# Patient Record
Sex: Female | Born: 1935
Health system: Southern US, Community
[De-identification: ages and names within clinical notes are randomized; demographics above are authoritative.]

## PROBLEM LIST (undated history)

## (undated) DIAGNOSIS — I71019 Dissection of thoracic aorta, unspecified: Secondary | ICD-10-CM

## (undated) DIAGNOSIS — I7101 Dissection of thoracic aorta: Secondary | ICD-10-CM

## (undated) DIAGNOSIS — N261 Atrophy of kidney (terminal): Secondary | ICD-10-CM

## (undated) DIAGNOSIS — D509 Iron deficiency anemia, unspecified: Secondary | ICD-10-CM

## (undated) DIAGNOSIS — I2510 Atherosclerotic heart disease of native coronary artery without angina pectoris: Secondary | ICD-10-CM

## (undated) DIAGNOSIS — K279 Peptic ulcer, site unspecified, unspecified as acute or chronic, without hemorrhage or perforation: Secondary | ICD-10-CM

## (undated) DIAGNOSIS — D649 Anemia, unspecified: Secondary | ICD-10-CM

## (undated) DIAGNOSIS — K922 Gastrointestinal hemorrhage, unspecified: Secondary | ICD-10-CM

## (undated) DIAGNOSIS — E039 Hypothyroidism, unspecified: Secondary | ICD-10-CM

## (undated) DIAGNOSIS — N183 Chronic kidney disease, stage 3 unspecified: Secondary | ICD-10-CM

## (undated) DIAGNOSIS — Z8679 Personal history of other diseases of the circulatory system: Secondary | ICD-10-CM

## (undated) DIAGNOSIS — N259 Disorder resulting from impaired renal tubular function, unspecified: Secondary | ICD-10-CM

## (undated) DIAGNOSIS — G459 Transient cerebral ischemic attack, unspecified: Secondary | ICD-10-CM

## (undated) DIAGNOSIS — Z87891 Personal history of nicotine dependence: Secondary | ICD-10-CM

## (undated) DIAGNOSIS — E119 Type 2 diabetes mellitus without complications: Secondary | ICD-10-CM

## (undated) DIAGNOSIS — I4510 Unspecified right bundle-branch block: Secondary | ICD-10-CM

## (undated) DIAGNOSIS — E785 Hyperlipidemia, unspecified: Secondary | ICD-10-CM

## (undated) DIAGNOSIS — I10 Essential (primary) hypertension: Secondary | ICD-10-CM

## (undated) DIAGNOSIS — R209 Unspecified disturbances of skin sensation: Secondary | ICD-10-CM

## (undated) DIAGNOSIS — M109 Gout, unspecified: Secondary | ICD-10-CM

## (undated) HISTORY — DX: Atrophy of kidney (terminal): N26.1

## (undated) HISTORY — PX: APPENDECTOMY (OPEN): SHX54

## (undated) HISTORY — DX: Dissection of thoracic aorta, unspecified: I71.019

## (undated) HISTORY — PX: OTHER SURGICAL HISTORY: SHX169

## (undated) HISTORY — DX: Dissection of thoracic aorta: I71.01

## (undated) HISTORY — PX: MOUTH SURGERY: SHX715

## (undated) HISTORY — PX: GLAUCOMA SURGERY: SHX656

## (undated) HISTORY — PX: HYSTERECTOMY: SHX81

## (undated) HISTORY — DX: Iron deficiency anemia, unspecified: D50.9

## (undated) HISTORY — DX: Atherosclerotic heart disease of native coronary artery without angina pectoris: I25.10

## (undated) HISTORY — DX: Gout, unspecified: M10.9

## (undated) HISTORY — DX: Unspecified disturbances of skin sensation: R20.9

## (undated) HISTORY — DX: Anemia, unspecified: D64.9

## (undated) HISTORY — DX: Disorder resulting from impaired renal tubular function, unspecified: N25.9

## (undated) HISTORY — DX: Personal history of other diseases of the circulatory system: Z86.79

## (undated) HISTORY — DX: Peptic ulcer, site unspecified, unspecified as acute or chronic, without hemorrhage or perforation: K27.9

---
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
## 1998-10-11 ENCOUNTER — Ambulatory Visit (HOSPITAL_COMMUNITY): Admission: RE | Admit: 1998-10-11 | Discharge: 1998-10-11 | Payer: Self-pay | Admitting: Family Medicine

## 1999-04-26 ENCOUNTER — Emergency Department (HOSPITAL_COMMUNITY): Admission: EM | Admit: 1999-04-26 | Discharge: 1999-04-26 | Payer: Self-pay | Admitting: Emergency Medicine

## 2000-01-13 ENCOUNTER — Ambulatory Visit (HOSPITAL_COMMUNITY): Admission: RE | Admit: 2000-01-13 | Discharge: 2000-01-13 | Payer: Self-pay | Admitting: Family Medicine

## 2001-01-14 ENCOUNTER — Ambulatory Visit (HOSPITAL_COMMUNITY): Admission: RE | Admit: 2001-01-14 | Discharge: 2001-01-14 | Payer: Self-pay | Admitting: Family Medicine

## 2001-07-01 DIAGNOSIS — G459 Transient cerebral ischemic attack, unspecified: Secondary | ICD-10-CM

## 2001-07-01 HISTORY — DX: Transient cerebral ischemic attack, unspecified: G45.9

## 2002-06-15 ENCOUNTER — Encounter: Payer: Self-pay | Admitting: Family Medicine

## 2002-06-15 ENCOUNTER — Ambulatory Visit (HOSPITAL_COMMUNITY): Admission: RE | Admit: 2002-06-15 | Discharge: 2002-06-15 | Payer: Self-pay | Admitting: Family Medicine

## 2003-07-12 ENCOUNTER — Ambulatory Visit (HOSPITAL_COMMUNITY): Admission: RE | Admit: 2003-07-12 | Discharge: 2003-07-12 | Payer: Self-pay | Admitting: Family Medicine

## 2003-07-12 ENCOUNTER — Encounter: Payer: Self-pay | Admitting: Family Medicine

## 2003-07-30 ENCOUNTER — Emergency Department (HOSPITAL_COMMUNITY): Admission: EM | Admit: 2003-07-30 | Discharge: 2003-07-30 | Payer: Self-pay | Admitting: Emergency Medicine

## 2003-07-30 ENCOUNTER — Encounter: Payer: Self-pay | Admitting: Emergency Medicine

## 2005-02-28 ENCOUNTER — Ambulatory Visit (HOSPITAL_COMMUNITY): Admission: RE | Admit: 2005-02-28 | Discharge: 2005-02-28 | Payer: Self-pay | Admitting: Family Medicine

## 2005-03-27 ENCOUNTER — Ambulatory Visit: Payer: Self-pay | Admitting: Internal Medicine

## 2005-10-31 ENCOUNTER — Ambulatory Visit: Payer: Self-pay | Admitting: Internal Medicine

## 2006-03-02 ENCOUNTER — Ambulatory Visit (HOSPITAL_COMMUNITY): Admission: RE | Admit: 2006-03-02 | Discharge: 2006-03-02 | Payer: Self-pay | Admitting: Internal Medicine

## 2006-03-03 ENCOUNTER — Ambulatory Visit: Payer: Self-pay | Admitting: Internal Medicine

## 2006-03-18 ENCOUNTER — Encounter: Admission: RE | Admit: 2006-03-18 | Discharge: 2006-03-18 | Payer: Self-pay | Admitting: Internal Medicine

## 2006-06-22 ENCOUNTER — Ambulatory Visit: Payer: Self-pay | Admitting: Internal Medicine

## 2006-10-14 ENCOUNTER — Ambulatory Visit: Payer: Self-pay | Admitting: Internal Medicine

## 2007-03-04 ENCOUNTER — Ambulatory Visit (HOSPITAL_COMMUNITY): Admission: RE | Admit: 2007-03-04 | Discharge: 2007-03-04 | Payer: Self-pay | Admitting: Internal Medicine

## 2007-03-04 ENCOUNTER — Encounter: Payer: Self-pay | Admitting: Internal Medicine

## 2007-03-15 ENCOUNTER — Ambulatory Visit: Payer: Self-pay | Admitting: Internal Medicine

## 2007-03-15 LAB — CONVERTED CEMR LAB
AST: 25 units/L (ref 0–37)
Cholesterol: 174 mg/dL (ref 0–200)

## 2007-06-25 ENCOUNTER — Ambulatory Visit: Payer: Self-pay | Admitting: Internal Medicine

## 2007-06-25 ENCOUNTER — Encounter: Payer: Self-pay | Admitting: Internal Medicine

## 2007-06-25 DIAGNOSIS — N259 Disorder resulting from impaired renal tubular function, unspecified: Secondary | ICD-10-CM | POA: Insufficient documentation

## 2007-06-25 DIAGNOSIS — Z8679 Personal history of other diseases of the circulatory system: Secondary | ICD-10-CM | POA: Insufficient documentation

## 2007-06-25 DIAGNOSIS — D509 Iron deficiency anemia, unspecified: Secondary | ICD-10-CM | POA: Insufficient documentation

## 2007-06-25 DIAGNOSIS — K279 Peptic ulcer, site unspecified, unspecified as acute or chronic, without hemorrhage or perforation: Secondary | ICD-10-CM | POA: Insufficient documentation

## 2007-06-25 DIAGNOSIS — D649 Anemia, unspecified: Secondary | ICD-10-CM | POA: Insufficient documentation

## 2007-06-25 DIAGNOSIS — I10 Essential (primary) hypertension: Secondary | ICD-10-CM | POA: Insufficient documentation

## 2007-06-25 DIAGNOSIS — E7800 Pure hypercholesterolemia, unspecified: Secondary | ICD-10-CM | POA: Insufficient documentation

## 2007-06-25 HISTORY — DX: Personal history of other diseases of the circulatory system: Z86.79

## 2007-06-25 HISTORY — DX: Essential (primary) hypertension: I10

## 2007-06-25 HISTORY — DX: Iron deficiency anemia, unspecified: D50.9

## 2007-06-25 HISTORY — DX: Anemia, unspecified: D64.9

## 2007-06-25 HISTORY — DX: Disorder resulting from impaired renal tubular function, unspecified: N25.9

## 2007-06-25 HISTORY — DX: Peptic ulcer, site unspecified, unspecified as acute or chronic, without hemorrhage or perforation: K27.9

## 2007-06-25 HISTORY — DX: Pure hypercholesterolemia, unspecified: E78.00

## 2007-08-13 ENCOUNTER — Encounter: Payer: Self-pay | Admitting: Internal Medicine

## 2007-09-27 ENCOUNTER — Ambulatory Visit: Payer: Self-pay | Admitting: Internal Medicine

## 2007-09-27 LAB — CONVERTED CEMR LAB: Hgb A1c MFr Bld: 6.7 % — ABNORMAL HIGH (ref 4.6–6.0)

## 2007-10-06 ENCOUNTER — Telehealth: Payer: Self-pay | Admitting: Internal Medicine

## 2007-10-21 ENCOUNTER — Encounter: Payer: Self-pay | Admitting: Internal Medicine

## 2008-01-21 ENCOUNTER — Ambulatory Visit: Payer: Self-pay | Admitting: Internal Medicine

## 2008-01-21 LAB — CONVERTED CEMR LAB: Hgb A1c MFr Bld: 6.5 % — ABNORMAL HIGH (ref 4.6–6.0)

## 2008-02-18 ENCOUNTER — Telehealth: Payer: Self-pay | Admitting: Internal Medicine

## 2008-06-02 ENCOUNTER — Ambulatory Visit (HOSPITAL_COMMUNITY): Admission: RE | Admit: 2008-06-02 | Discharge: 2008-06-02 | Payer: Self-pay | Admitting: Internal Medicine

## 2008-07-03 ENCOUNTER — Ambulatory Visit: Payer: Self-pay | Admitting: Internal Medicine

## 2008-09-25 ENCOUNTER — Ambulatory Visit: Payer: Self-pay | Admitting: Internal Medicine

## 2008-10-23 ENCOUNTER — Ambulatory Visit: Payer: Self-pay | Admitting: Internal Medicine

## 2008-10-23 LAB — CONVERTED CEMR LAB
BUN: 16 mg/dL (ref 6–23)
Blood Glucose, Fingerstick: 120
CO2: 28 meq/L (ref 19–32)
Calcium: 9.4 mg/dL (ref 8.4–10.5)
GFR calc non Af Amer: 36 mL/min
Hgb A1c MFr Bld: 6.8 % — ABNORMAL HIGH (ref 4.6–6.0)
Sodium: 140 meq/L (ref 135–145)

## 2009-03-27 ENCOUNTER — Ambulatory Visit: Payer: Self-pay | Admitting: Internal Medicine

## 2009-03-27 DIAGNOSIS — R209 Unspecified disturbances of skin sensation: Secondary | ICD-10-CM | POA: Insufficient documentation

## 2009-03-27 HISTORY — DX: Unspecified disturbances of skin sensation: R20.9

## 2009-03-27 LAB — CONVERTED CEMR LAB
Blood Glucose, Fingerstick: 115
Hgb A1c MFr Bld: 6.6 % — ABNORMAL HIGH (ref 4.6–6.5)

## 2009-03-28 ENCOUNTER — Encounter: Payer: Self-pay | Admitting: Internal Medicine

## 2009-06-04 ENCOUNTER — Ambulatory Visit (HOSPITAL_COMMUNITY): Admission: RE | Admit: 2009-06-04 | Discharge: 2009-06-04 | Payer: Self-pay | Admitting: Internal Medicine

## 2009-08-15 ENCOUNTER — Encounter: Payer: Self-pay | Admitting: Internal Medicine

## 2009-08-16 ENCOUNTER — Ambulatory Visit: Payer: Self-pay | Admitting: Internal Medicine

## 2009-08-16 LAB — CONVERTED CEMR LAB
AST: 25 units/L (ref 0–37)
Alkaline Phosphatase: 54 units/L (ref 39–117)
BUN: 18 mg/dL (ref 6–23)
Basophils Absolute: 0 10*3/uL (ref 0.0–0.1)
Calcium: 9.7 mg/dL (ref 8.4–10.5)
Creatinine, Ser: 1.3 mg/dL — ABNORMAL HIGH (ref 0.4–1.2)
Creatinine,U: 372.2 mg/dL
Eosinophils Relative: 9 % — ABNORMAL HIGH (ref 0.0–5.0)
Glucose, Bld: 106 mg/dL — ABNORMAL HIGH (ref 70–99)
HDL: 48.7 mg/dL (ref >39.00)
Hgb A1c MFr Bld: 6.5 % (ref 4.6–6.5)
LDL Cholesterol: 83 mg/dL (ref 0–99)
Lymphocytes Relative: 29.7 % (ref 12.0–46.0)
Microalb Creat Ratio: 5.6 mg/g (ref 0.0–30.0)
Monocytes Relative: 7.6 % (ref 3.0–12.0)
Sodium: 142 meq/L (ref 135–145)
TSH: 1.35 microintl units/mL (ref 0.35–5.50)
Total Bilirubin: 1.2 mg/dL (ref 0.3–1.2)
Total CHOL/HDL Ratio: 3
Triglycerides: 99 mg/dL (ref 0.0–149.0)

## 2009-10-01 ENCOUNTER — Ambulatory Visit: Payer: Self-pay | Admitting: Internal Medicine

## 2009-10-01 LAB — CONVERTED CEMR LAB: Blood Glucose, Fingerstick: 111

## 2009-11-16 ENCOUNTER — Ambulatory Visit: Payer: Self-pay | Admitting: Internal Medicine

## 2009-11-16 DIAGNOSIS — M109 Gout, unspecified: Secondary | ICD-10-CM | POA: Insufficient documentation

## 2009-11-16 HISTORY — DX: Gout, unspecified: M10.9

## 2009-11-16 LAB — CONVERTED CEMR LAB
Blood Glucose, Fingerstick: 97
Uric Acid, Serum: 4.3 mg/dL (ref 2.4–7.0)

## 2010-04-01 ENCOUNTER — Ambulatory Visit: Payer: Self-pay | Admitting: Internal Medicine

## 2010-04-01 LAB — CONVERTED CEMR LAB
BUN: 12 mg/dL (ref 6–23)
Blood Glucose, Fingerstick: 95
Creatinine, Ser: 1.4 mg/dL — ABNORMAL HIGH (ref 0.4–1.2)
GFR calc non Af Amer: 47.39 mL/min (ref >60)
Hgb A1c MFr Bld: 6.3 % (ref 4.6–6.5)
Sodium: 142 meq/L (ref 135–145)

## 2010-05-28 ENCOUNTER — Ambulatory Visit (HOSPITAL_COMMUNITY): Admission: RE | Admit: 2010-05-28 | Discharge: 2010-05-28 | Payer: Self-pay | Admitting: Internal Medicine

## 2010-06-05 ENCOUNTER — Encounter: Payer: Self-pay | Admitting: Internal Medicine

## 2010-08-05 ENCOUNTER — Ambulatory Visit: Payer: Self-pay | Admitting: Internal Medicine

## 2010-08-20 ENCOUNTER — Inpatient Hospital Stay (HOSPITAL_COMMUNITY): Admission: EM | Admit: 2010-08-20 | Discharge: 2010-08-21 | Payer: Self-pay | Admitting: Emergency Medicine

## 2010-08-20 ENCOUNTER — Ambulatory Visit: Payer: Self-pay | Admitting: Internal Medicine

## 2010-08-20 ENCOUNTER — Encounter (INDEPENDENT_AMBULATORY_CARE_PROVIDER_SITE_OTHER): Payer: Self-pay | Admitting: Internal Medicine

## 2010-12-31 ENCOUNTER — Ambulatory Visit: Admit: 2010-12-31 | Payer: Self-pay | Admitting: Internal Medicine

## 2011-01-12 LAB — CONVERTED CEMR LAB
ALT: 15 units/L (ref 0–35)
ALT: 19 units/L (ref 0–35)
AST: 22 units/L (ref 0–37)
Albumin: 3.9 g/dL (ref 3.5–5.2)
BUN: 13 mg/dL (ref 6–23)
Basophils Absolute: 0 10*3/uL (ref 0.0–0.1)
Bilirubin, Direct: 0.1 mg/dL (ref 0.0–0.3)
Chloride: 106 meq/L (ref 96–112)
Cholesterol: 153 mg/dL (ref 0–200)
Creatinine, Ser: 1.4 mg/dL — ABNORMAL HIGH (ref 0.4–1.2)
Eosinophils Absolute: 0.4 10*3/uL (ref 0.0–0.7)
Eosinophils Relative: 5.5 % — ABNORMAL HIGH (ref 0.0–5.0)
Eosinophils Relative: 5.7 % — ABNORMAL HIGH (ref 0.0–5.0)
Glucose, Bld: 111 mg/dL — ABNORMAL HIGH (ref 70–99)
Glucose, Bld: 119 mg/dL — ABNORMAL HIGH (ref 70–99)
Hemoglobin: 11.3 g/dL — ABNORMAL LOW (ref 12.0–15.0)
Hgb A1c MFr Bld: 6.8 % — ABNORMAL HIGH (ref 4.6–6.0)
LDL Cholesterol: 82 mg/dL (ref 0–99)
Lymphocytes Relative: 30.8 % (ref 12.0–46.0)
MCHC: 32.1 g/dL (ref 30.0–36.0)
MCHC: 32.1 g/dL (ref 30.0–36.0)
Monocytes Relative: 7.1 % (ref 3.0–11.0)
Neutro Abs: 4.5 10*3/uL (ref 1.4–7.7)
Platelets: 290 10*3/uL (ref 150–400)
Potassium: 3.9 meq/L (ref 3.5–5.1)
RBC: 5.09 M/uL (ref 3.87–5.11)
Sodium: 141 meq/L (ref 135–145)
TSH: 2.2 microintl units/mL (ref 0.35–5.50)
Total Bilirubin: 1 mg/dL (ref 0.3–1.2)
Total Bilirubin: 1.1 mg/dL (ref 0.3–1.2)
Total CHOL/HDL Ratio: 3
Total Protein: 7.7 g/dL (ref 6.0–8.3)
WBC: 7.5 10*3/uL (ref 4.5–10.5)
WBC: 7.9 10*3/uL (ref 4.5–10.5)

## 2011-01-14 NOTE — Assessment & Plan Note (Signed)
Summary: 4 month fup//ccm rsc bmp/njr   Vital Signs:  Patient profile:   76 year old female Weight:      183 pounds Temp:     97.8 degrees F oral BP sitting:   120 / 82  (right arm) Cuff size:   regular  Vitals Entered By: Cay Schillings LPN (August 22, 624THL 8:42 AM) CC: 4 mo rov - doing well    c/o knee swelling Is Patient Diabetic? Yes Did you bring your meter with you today? No CBG Result 124   CC:  4 mo rov - doing well    c/o knee swelling.  History of Present Illness:  76 year old patient who is seen today for follow-up of type 2 diabetes.she has dyslipidemia, well controlled on simvastatin therapy.  Her last hemoglobin A1c6.3.  Chest treated hypertension and a history of gout.  She has mild renal insufficiency.  She is doing quite well.  she just returned from a [place] and has had some knee pain that she feels is related to all the activity.  No new concerns or complaints.  She maintains not glycemic control.  Her blood pressure normal.  Today  Allergies (verified): No Known Drug Allergies  Past History:  Past Medical History: Reviewed history from 11/16/2009 and no changes required. Diabetes mellitus, type II Hyperlipidemia Hypertension Anemia-NOS Peptic ulcer disease Renal insufficiency RBBB and left anterior hemiblock Transient ischemic attack, hx of  history of anemia, secondary to thalassemia trait history of left brain TIA 2005 Gout  Past Surgical History: Reviewed history from 01/21/2008 and no changes required. Appendectomy 53 Hysterectomy 80 Tonsillectomy 44 colonoscopy 2003 or 2004  Review of Systems       The patient complains of difficulty walking.  The patient denies anorexia, fever, weight loss, weight gain, vision loss, decreased hearing, hoarseness, chest pain, syncope, dyspnea on exertion, peripheral edema, prolonged cough, headaches, hemoptysis, abdominal pain, melena, hematochezia, severe indigestion/heartburn, hematuria,  incontinence, genital sores, muscle weakness, suspicious skin lesions, transient blindness, depression, unusual weight change, abnormal bleeding, enlarged lymph nodes, angioedema, and breast masses.    Physical Exam  General:  overweight-appearing.  120/82overweight-appearing.   Head:  Normocephalic and atraumatic without obvious abnormalities. No apparent alopecia or balding. Eyes:  No corneal or conjunctival inflammation noted. EOMI. Perrla. Funduscopic exam benign, without hemorrhages, exudates or papilledema. Vision grossly normal. Mouth:  Oral mucosa and oropharynx without lesions or exudates.  Teeth in good repair. Neck:  No deformities, masses, or tenderness noted. Chest Wall:  No deformities, masses, or tenderness noted. Lungs:  Normal respiratory effort, chest expands symmetrically. Lungs are clear to auscultation, no crackles or wheezes. Heart:  Normal rate and regular rhythm. S1 and S2 normal without gallop, murmur, click, rub or other extra sounds. Abdomen:  Bowel sounds positive,abdomen soft and non-tender without masses, organomegaly or hernias noted. Msk:  No deformity or scoliosis noted of thoracic or lumbar spine.   Pulses:  R and L carotid,radial,femoral,dorsalis pedis and posterior tibial pulses are full and equal bilaterally  Diabetes Management Exam:    Foot Exam (with socks and/or shoes not present):       Sensory-Pinprick/Light touch:          Left medial foot (L-4): normal          Left dorsal foot (L-5): normal          Left lateral foot (S-1): normal          Right medial foot (L-4): normal  Right dorsal foot (L-5): normal          Right lateral foot (S-1): normal       Sensory-Monofilament:          Left foot: normal          Right foot: normal       Inspection:          Left foot: normal          Right foot: normal       Nails:          Left foot: normal          Right foot: normal    Foot Exam by Podiatrist:       Date: 08/05/2010        Results: no diabetic findings       Done by: PCP    Eye Exam:       Eye Exam done elsewhere          Date: 06/05/2010          Results: normal          Done by: groat   Impression & Recommendations:  Problem # 1:  RENAL INSUFFICIENCY (ICD-588.9)  Problem # 2:  HYPERTENSION (ICD-401.9)  Her updated medication list for this problem includes:    Lisinopril 20 Mg Tabs (Lisinopril) ..... One daily  Her updated medication list for this problem includes:    Lisinopril 20 Mg Tabs (Lisinopril) ..... One daily  Problem # 3:  DIABETES MELLITUS, TYPE II (ICD-250.00)  Her updated medication list for this problem includes:    Metformin Hcl 500 Mg Tabs (Metformin hcl) ..... One twice daily    Bayer Aspirin 325 Mg Tabs (Aspirin) .Marland Kitchen... 1 once daily    Lisinopril 20 Mg Tabs (Lisinopril) ..... One daily  Orders: Capillary Blood Glucose/CBG [id number]) Venipuncture [id number]) TLB-A1C / Hgb A1C (Glycohemoglobin) (83036-A1C) Specimen Handling (99000)  Her updated medication list for this problem includes:    Metformin Hcl 500 Mg Tabs (Metformin hcl) ..... One twice daily    Bayer Aspirin 325 Mg Tabs (Aspirin) .Marland Kitchen... 1 once daily    Lisinopril 20 Mg Tabs (Lisinopril) ..... One daily  Problem # 4:  HYPERLIPIDEMIA (ICD-272.4)  Her updated medication list for this problem includes:    Simvastatin 40 Mg Tabs (Simvastatin) .Marland Kitchen... 1 once daily  Her updated medication list for this problem includes:    Simvastatin 40 Mg Tabs (Simvastatin) .Marland Kitchen... 1 once daily  Complete Medication List: 1)  Cimetidine 400 Mg Tabs (Cimetidine) .Marland Kitchen.. 1 two times a day 2)  Klor-con M20 20 Meq Tbcr (Potassium chloride crys cr) .Marland Kitchen.. 1 once daily 3)  Metformin Hcl 500 Mg Tabs (Metformin hcl) .... One twice daily 4)  Simvastatin 40 Mg Tabs (Simvastatin) .Marland Kitchen.. 1 once daily 5)  Bayer Aspirin 325 Mg Tabs (Aspirin) .Marland Kitchen.. 1 once daily 6)  Proair Hfa 108 (90 Base) Mcg/act Aers (Albuterol sulfate) .... Uad 7)  Lisinopril 20 Mg Tabs  (Lisinopril) .... One daily 8)  Allopurinol 300 Mg Tabs (Allopurinol) .Marland Kitchen.. 1 once daily 9)  Colchicine 0.6 Mg Tabs (Colchicine) .... As needed  Patient Instructions: 1)  Please schedule a follow-up appointment in 4 months for CPX  2)  Limit your Sodium (Salt) to less than 2 grams a day(slightly less than 1/2 a teaspoon) to prevent fluid retention, swelling, or worsening of symptoms. 3)  It is important that you exercise regularly at least 20 minutes 5 times a week. If  you develop chest pain, have severe difficulty breathing, or feel very tired , stop exercising immediately and seek medical attention. 4)  You need to lose weight. Consider a lower calorie diet and regular exercise.  5)  Check your blood sugars regularly. If your readings are usually above : or below 70 you should contact our office. 6)  It is important that your Diabetic A1c level is checked every 3 months. 7)  Advised not to eat any food or drink any liquids after 10 PM the night before your procedure.

## 2011-01-14 NOTE — Letter (Signed)
Summary: Diabetic Eye Exam/[facility]  Diabetic Eye Exam/[facility]   Imported By: Laural Benes 06/14/2010 13:20:35  _____________________________________________________________________  External Attachment:    Type:   Image     Comment:   External Document

## 2011-01-14 NOTE — Assessment & Plan Note (Signed)
Summary: 4 mo rov/mm---PT [place] (BMP) // RS   Vital Signs:  Patient profile:   76 year old female Weight:      185 pounds Temp:     98.2 degrees F oral BP sitting:   118 / 78  (right arm) Cuff size:   regular  Vitals Entered By: Cay Schillings LPN (April 18, 624THL [id number] AM) CC: 4 mos rov - doing well Is Patient Diabetic? Yes Did you bring your meter with you today? No CBG Result 95   CC:  4 mos rov - doing well.  History of Present Illness: 76 year old patient who is seen today for follow-up of her type 2 diabetes.  She has a history of hypertension and dyslipidemia.  She is doing quite well.  She also is a history of mild anemia.  Her last and will A1c was stable.  She is scheduled for an eye exam tomorrow.  Random blood sugar today 95.  She has a history of dyslipidemia, controlled on simvastatin, which she continues to tolerate well.  she denies any cardiopulmonary complaints.  Preventive Screening-Counseling & Management  Alcohol-Tobacco     Smoking Status: quit  Allergies (verified): No Known Drug Allergies  Past History:  Past Medical History: Reviewed history from 11/16/2009 and no changes required. Diabetes mellitus, type II Hyperlipidemia Hypertension Anemia-NOS Peptic ulcer disease Renal insufficiency RBBB and left anterior hemiblock Transient ischemic attack, hx of  history of anemia, secondary to thalassemia trait history of left brain TIA 2005 Gout  Past Surgical History: Reviewed history from 01/21/2008 and no changes required. Appendectomy 53 Hysterectomy 78 Tonsillectomy 44 colonoscopy 2003 or 2004  Review of Systems  The patient denies anorexia, fever, weight loss, weight gain, vision loss, decreased hearing, hoarseness, chest pain, syncope, dyspnea on exertion, peripheral edema, prolonged cough, headaches, hemoptysis, abdominal pain, melena, hematochezia, severe indigestion/heartburn, hematuria, incontinence, genital sores, muscle weakness,  suspicious skin lesions, transient blindness, difficulty walking, depression, unusual weight change, abnormal bleeding, enlarged lymph nodes, angioedema, and breast masses.    Physical Exam  General:  overweight-appearing.  118/78overweight-appearing.   Head:  Normocephalic and atraumatic without obvious abnormalities. No apparent alopecia or balding. Eyes:  No corneal or conjunctival inflammation noted. EOMI. Perrla. Funduscopic exam benign, without hemorrhages, exudates or papilledema. Vision grossly normal. Mouth:  Oral mucosa and oropharynx without lesions or exudates.  Teeth in good repair. Neck:  No deformities, masses, or tenderness noted. Lungs:  Normal respiratory effort, chest expands symmetrically. Lungs are clear to auscultation, no crackles or wheezes. Heart:  Normal rate and regular rhythm. S1 and S2 normal without gallop, murmur, click, rub or other extra sounds. Abdomen:  Bowel sounds positive,abdomen soft and non-tender without masses, organomegaly or hernias noted. Msk:  No deformity or scoliosis noted of thoracic or lumbar spine.   Pulses:  R and L carotid,radial,femoral,dorsalis pedis and posterior tibial pulses are full and equal bilaterally Extremities:  No clubbing, cyanosis, edema, or deformity noted with normal full range of motion of all joints.     Impression & Recommendations:  Problem # 1:  RENAL INSUFFICIENCY (ICD-588.9)  Orders: Venipuncture [id number]) TLB-BMP (Basic Metabolic Panel-BMET) (99991111)  Problem # 2:  HYPERTENSION (ICD-401.9)  Her updated medication list for this problem includes:    Lisinopril 20 Mg Tabs (Lisinopril) ..... One daily  Her updated medication list for this problem includes:    Lisinopril 20 Mg Tabs (Lisinopril) ..... One daily  Problem # 3:  DIABETES MELLITUS, TYPE II (ICD-250.00)  Her updated medication list for  this problem includes:    Metformin Hcl 500 Mg Tabs (Metformin hcl) ..... One twice daily    Bayer Aspirin  325 Mg Tabs (Aspirin) .Marland Kitchen... 1 once daily    Lisinopril 20 Mg Tabs (Lisinopril) ..... One daily  Orders: Capillary Blood Glucose/CBG (82948) TLB-A1C / Hgb A1C (Glycohemoglobin) (83036-A1C)  Her updated medication list for this problem includes:    Metformin Hcl 500 Mg Tabs (Metformin hcl) ..... One twice daily    Bayer Aspirin 325 Mg Tabs (Aspirin) .Marland Kitchen... 1 once daily    Lisinopril 20 Mg Tabs (Lisinopril) ..... One daily  Problem # 4:  HYPERLIPIDEMIA (ICD-272.4)  Her updated medication list for this problem includes:    Simvastatin 40 Mg Tabs (Simvastatin) .Marland Kitchen... 1 once daily  Her updated medication list for this problem includes:    Simvastatin 40 Mg Tabs (Simvastatin) .Marland Kitchen... 1 once daily  Complete Medication List: 1)  Cimetidine 400 Mg Tabs (Cimetidine) .Marland Kitchen.. 1 two times a day 2)  Klor-con M20 20 Meq Tbcr (Potassium chloride crys cr) .Marland Kitchen.. 1 once daily 3)  Metformin Hcl 500 Mg Tabs (Metformin hcl) .... One twice daily 4)  Simvastatin 40 Mg Tabs (Simvastatin) .Marland Kitchen.. 1 once daily 5)  Bayer Aspirin 325 Mg Tabs (Aspirin) .Marland Kitchen.. 1 once daily 6)  Proair Hfa 108 (90 Base) Mcg/act Aers (Albuterol sulfate) .... Uad 7)  Lisinopril 20 Mg Tabs (Lisinopril) .... One daily 8)  Allopurinol 300 Mg Tabs (Allopurinol) .Marland Kitchen.. 1 once daily 9)  Colchicine 0.6 Mg Tabs (Colchicine) .... As needed  Patient Instructions: 1)  Please schedule a follow-up appointment in 4 months. 2)  Limit your Sodium (Salt) to less than 2 grams a day(slightly less than 1/2 a teaspoon) to prevent fluid retention, swelling, or worsening of symptoms. 3)  It is important that you exercise regularly at least 20 minutes 5 times a week. If you develop chest pain, have severe difficulty breathing, or feel very tired , stop exercising immediately and seek medical attention. 4)  You need to lose weight. Consider a lower calorie diet and regular exercise.  5)  Take calcium +Vitamin D daily. 6)  Check your blood sugars regularly. If your  readings are usually above : or below 70 you should contact our office. 7)  It is important that your Diabetic A1c level is checked every 3 months. 8)  See your eye doctor yearly to check for diabetic eye damage. 9)  Check your Blood Pressure regularly. If it is above: 150/90 you should make an appointment. Prescriptions: ALLOPURINOL 300 MG TABS (ALLOPURINOL) 1 once daily  #90 x 6   Entered and Authorized by:   [name]  MD   Signed by:   [name]  MD on 04/01/2010   Method used:   Electronically to        [facility] [phone]* (retail)       [place], [place]  [id number]       Ph: [phone] or [phone]       Fax: [phone]   RxID:   [id number] LISINOPRIL 20 MG TABS (LISINOPRIL) one daily  #90 x 6   Entered and Authorized by:   [name]  MD   Signed by:   [name]  MD on 04/01/2010   Method used:   Electronically to        [facility]  [address] [phone]* (retail)       [place], [place]  [id number]       Ph: [phone] or [phone]       Fax: [phone]   RxID:   [id number] PROAIR HFA 108 [phone] BASE) MCG/ACT  AERS (ALBUTEROL SULFATE) UAD  #1 x 6   Entered and Authorized by:   [name]  MD   Signed by:   [name]  MD on 04/01/2010   Method used:   Electronically to        [facility] [phone]* (retail)       [place], [place]  [id number]       Ph: [phone] or [phone]       Fax: [phone]   RxID:   [id number] SIMVASTATIN 40 MG  TABS (SIMVASTATIN) 1 once daily  #90 x 66   Entered and Authorized by:   [name]  MD   Signed by:   [name]  MD on 04/01/2010   Method used:   Electronically to        [facility] [phone]* (retail)       [place],  [place]  [id number]       Ph: [phone] or [phone]       Fax: [phone]   RxID:   [id number] HCL 500 MG  TABS (METFORMIN HCL) one twice daily  #180 x 6   Entered and Authorized by:   [name]  MD   Signed by:   [name]  MD on 04/01/2010   Method used:   Electronically to        [facility] [phone]* (retail)       [place], [place]  [id number]       Ph: [phone] or [phone]       Fax: [phone]   RxID:   [id number] KLOR-CON M20 20 MEQ TBCR (POTASSIUM CHLORIDE CRYS CR) 1 once daily  #90 Tablet x 6   Entered and Authorized by:   [name]  MD   Signed by:   [name]  MD on 04/01/2010   Method used:   Electronically to        [facility] [phone]* (retail)       [place], [place]  [id number]       Ph: [phone] or [phone]       Fax: [phone]   [id number] CIMETIDINE 400 MG TABS (CIMETIDINE) 1 two times a day  #180 Tablet x 6   Entered and Authorized by:   [name]  MD   Signed by:   [name]  MD on 04/01/2010   Method used:   Electronically to        [facility] [phone]* (retail)       [place]  [place], [place]  [id number]       Ph: [phone] or [phone]       Fax: [phone]   RxID:   [id number]

## 2011-01-17 ENCOUNTER — Other Ambulatory Visit: Payer: Self-pay | Admitting: Internal Medicine

## 2011-02-25 ENCOUNTER — Ambulatory Visit (INDEPENDENT_AMBULATORY_CARE_PROVIDER_SITE_OTHER): Payer: PRIVATE HEALTH INSURANCE | Admitting: Internal Medicine

## 2011-02-25 ENCOUNTER — Encounter: Payer: Self-pay | Admitting: Internal Medicine

## 2011-02-25 DIAGNOSIS — E119 Type 2 diabetes mellitus without complications: Secondary | ICD-10-CM

## 2011-02-25 DIAGNOSIS — E785 Hyperlipidemia, unspecified: Secondary | ICD-10-CM

## 2011-02-25 DIAGNOSIS — I10 Essential (primary) hypertension: Secondary | ICD-10-CM

## 2011-02-25 MED ORDER — ALBUTEROL SULFATE HFA 108 (90 BASE) MCG/ACT IN AERS: 2.0000 | INHALATION_SPRAY | Freq: Four times a day (QID) | RESPIRATORY_TRACT | Status: DC | PRN

## 2011-02-25 MED ORDER — ALLOPURINOL 300 MG PO TABS: 300.0000 mg | ORAL_TABLET | Freq: Every day | ORAL | Status: DC

## 2011-02-25 MED ORDER — SIMVASTATIN 40 MG PO TABS: 40.0000 mg | ORAL_TABLET | Freq: Every day | ORAL | Status: DC

## 2011-02-25 MED ORDER — LISINOPRIL 20 MG PO TABS: 20.0000 mg | ORAL_TABLET | Freq: Every day | ORAL | Status: DC

## 2011-02-25 MED ORDER — METFORMIN HCL 500 MG PO TABS: 500.0000 mg | ORAL_TABLET | Freq: Two times a day (BID) | ORAL | Status: DC

## 2011-02-25 NOTE — Progress Notes (Signed)
  Subjective:    Patient ID: Danielle Atkins, female    DOB: 05/14/1935, 76 y.o.   MRN: [id number]  HPI  97 76-year-old patient who has a history of type 2 diabetes. She was hospitalized briefly in September of last year for atypical chest pain it was thought related to reflux. She has had no recurrent symptoms. She has treated hypertension and dyslipidemia. Blood sugars have remained under excellent control. Denies any exertional chest pain medical regimen includes simvastatin which he continues to tolerate well. She has a history gout that has been stable.   Hemoglobin A1c's have been consistently less than 7    Review of Systems  Constitutional: Negative.   HENT: Negative for hearing loss, congestion, sore throat, rhinorrhea, dental problem, sinus pressure and tinnitus.   Eyes: Negative for pain, discharge and visual disturbance.  Respiratory: Negative for cough and shortness of breath.   Cardiovascular: Negative for chest pain, palpitations and leg swelling.  Gastrointestinal: Negative for nausea, vomiting, abdominal pain, diarrhea, constipation, blood in stool and abdominal distention.  Genitourinary: Negative for dysuria, urgency, frequency, hematuria, flank pain, vaginal bleeding, vaginal discharge, difficulty urinating, vaginal pain and pelvic pain.  Musculoskeletal: Negative for joint swelling, arthralgias and gait problem.  Skin: Negative for rash.  Neurological: Negative for dizziness, syncope, speech difficulty, weakness, numbness and headaches.  Hematological: Negative for adenopathy.  Psychiatric/Behavioral: Negative for behavioral problems, dysphoric mood and agitation. The patient is not nervous/anxious.        Objective:   Physical Exam  Constitutional: She is oriented to person, place, and time. She appears well-developed and well-nourished.  HENT:  Head: Normocephalic.  Right Ear: External ear normal.  Left Ear: External ear normal.  Mouth/Throat: Oropharynx is clear and  moist.  Eyes: Conjunctivae and EOM are normal. Pupils are equal, round, and reactive to light.  Neck: Normal range of motion. Neck supple. No thyromegaly present.  Cardiovascular: Normal rate, regular rhythm, normal heart sounds and intact distal pulses.   Pulmonary/Chest: Effort normal and breath sounds normal.  Abdominal: Soft. Bowel sounds are normal. She exhibits no mass. There is no tenderness.  Musculoskeletal: Normal range of motion.  Lymphadenopathy:    She has no cervical adenopathy.  Neurological: She is alert and oriented to person, place, and time.  Skin: Skin is warm and dry. No rash noted.  Psychiatric: She has a normal mood and affect. Her behavior is normal.          Assessment & Plan:   diabetes mellitus. Appears to be clinically stable. We'll check a hemoglobin A1c  Hypertension. Well controlled we'll continue lisinopril.  Dyslipidemia we'll continue simvastatin 40 mg daily medications refilled  History of gout asymptomatic

## 2011-02-25 NOTE — Patient Instructions (Signed)
Please check your hemoglobin A1c every 3 months  Limit your sodium (Salt) intake    It is important that you exercise regularly, at least 20 minutes 3 to 4 times per week.  If you develop chest pain or shortness of breath seek  medical attention.  Please check your blood pressure on a regular basis.  If it is consistently greater than 150/90, please make an office appointment.

## 2011-02-26 ENCOUNTER — Ambulatory Visit: Payer: Self-pay | Admitting: Internal Medicine

## 2011-02-27 LAB — CARDIAC PANEL(CRET KIN+CKTOT+MB+TROPI)
CK, MB: 0.7 ng/mL (ref 0.3–4.0)
CK, MB: 0.7 ng/mL (ref 0.3–4.0)
Relative Index: 0.6 (ref 0.0–2.5)
Total CK: 107 U/L (ref 7–177)
Total CK: 110 U/L (ref 7–177)

## 2011-02-27 LAB — BASIC METABOLIC PANEL
Calcium: 9.4 mg/dL (ref 8.4–10.5)
GFR calc non Af Amer: 31 mL/min — ABNORMAL LOW (ref >60)
Glucose, Bld: 113 mg/dL — ABNORMAL HIGH (ref 70–99)
Sodium: 140 mEq/L (ref 135–145)

## 2011-02-27 LAB — CBC
HCT: 33.8 % — ABNORMAL LOW (ref 36.0–46.0)
Hemoglobin: 10.6 g/dL — ABNORMAL LOW (ref 12.0–15.0)
Hemoglobin: 9.8 g/dL — ABNORMAL LOW (ref 12.0–15.0)
RBC: 4.62 MIL/uL (ref 3.87–5.11)
RBC: 4.87 MIL/uL (ref 3.87–5.11)
WBC: 6.6 10*3/uL (ref 4.0–10.5)

## 2011-02-27 LAB — DIFFERENTIAL
Basophils Absolute: 0 10*3/uL (ref 0.0–0.1)
Eosinophils Absolute: 0.4 10*3/uL (ref 0.0–0.7)
Eosinophils Relative: 4 % (ref 0–5)
Eosinophils Relative: 6 % — ABNORMAL HIGH (ref 0–5)
Lymphs Abs: 1.9 10*3/uL (ref 0.7–4.0)
Monocytes Absolute: 0.6 10*3/uL (ref 0.1–1.0)
Monocytes Relative: 9 % (ref 3–12)
Neutro Abs: 3.9 10*3/uL (ref 1.7–7.7)
Neutrophils Relative %: 53 % (ref 43–77)

## 2011-02-27 LAB — POCT CARDIAC MARKERS
CKMB, poc: <1 ng/mL — ABNORMAL LOW (ref 1.0–8.0)
CKMB, poc: <1 ng/mL — ABNORMAL LOW (ref 1.0–8.0)
Myoglobin, poc: 75.6 ng/mL (ref 12–200)
Troponin i, poc: <0.05 ng/mL (ref 0.00–0.09)
Troponin i, poc: <0.05 ng/mL (ref 0.00–0.09)
Troponin i, poc: <0.05 ng/mL (ref 0.00–0.09)

## 2011-02-27 LAB — RETICULOCYTES
RBC.: 4.97 MIL/uL (ref 3.87–5.11)
Retic Ct Pct: 1.6 % (ref 0.4–3.1)

## 2011-02-27 LAB — GLUCOSE, CAPILLARY
Glucose-Capillary: 120 mg/dL — ABNORMAL HIGH (ref 70–99)
Glucose-Capillary: 88 mg/dL (ref 70–99)

## 2011-02-27 LAB — CREATININE, URINE, RANDOM: Creatinine, Urine: 34.1 mg/dL

## 2011-02-27 LAB — COMPREHENSIVE METABOLIC PANEL
ALT: 12 U/L (ref 0–35)
AST: 19 U/L (ref 0–37)
CO2: 26 mEq/L (ref 19–32)
Chloride: 109 mEq/L (ref 96–112)
GFR calc Af Amer: 45 mL/min — ABNORMAL LOW (ref >60)
GFR calc non Af Amer: 37 mL/min — ABNORMAL LOW (ref >60)
Potassium: 4.2 mEq/L (ref 3.5–5.1)
Sodium: 141 mEq/L (ref 135–145)
Total Bilirubin: 0.6 mg/dL (ref 0.3–1.2)

## 2011-02-27 LAB — LIPID PANEL
Cholesterol: 115 mg/dL (ref 0–200)
HDL: 43 mg/dL (ref >39)
Total CHOL/HDL Ratio: 2.7 RATIO

## 2011-02-27 LAB — VITAMIN B12: Vitamin B-12: 1544 pg/mL — ABNORMAL HIGH (ref 211–911)

## 2011-02-27 LAB — FOLATE: Folate: >20 ng/mL

## 2011-02-27 LAB — IRON AND TIBC
Iron: 41 ug/dL — ABNORMAL LOW (ref 42–135)
TIBC: 271 ug/dL (ref 250–470)

## 2011-04-03 ENCOUNTER — Other Ambulatory Visit: Payer: Self-pay

## 2011-04-03 MED ORDER — METFORMIN HCL 500 MG PO TABS: 500.0000 mg | ORAL_TABLET | Freq: Two times a day (BID) | ORAL | Status: DC

## 2011-04-03 NOTE — Telephone Encounter (Signed)
quanity adjusted from 90 to 180

## 2011-04-29 ENCOUNTER — Other Ambulatory Visit: Payer: Self-pay | Admitting: Internal Medicine

## 2011-04-29 DIAGNOSIS — Z1231 Encounter for screening mammogram for malignant neoplasm of breast: Secondary | ICD-10-CM

## 2011-05-05 ENCOUNTER — Other Ambulatory Visit: Payer: Self-pay | Admitting: Internal Medicine

## 2011-05-21 ENCOUNTER — Telehealth: Payer: Self-pay | Admitting: *Deleted

## 2011-05-21 NOTE — Telephone Encounter (Signed)
Pt needs refill of potassium 20 meq

## 2011-05-22 MED ORDER — POTASSIUM CHLORIDE CRYS ER 20 MEQ PO TBCR: 20.0000 meq | EXTENDED_RELEASE_TABLET | Freq: Every day | ORAL | Status: DC

## 2011-05-27 ENCOUNTER — Ambulatory Visit: Payer: PRIVATE HEALTH INSURANCE | Admitting: Internal Medicine

## 2011-06-02 ENCOUNTER — Ambulatory Visit (HOSPITAL_COMMUNITY)
Admission: RE | Admit: 2011-06-02 | Discharge: 2011-06-02 | Disposition: A | Discharge status: Home / Self Care | Payer: PRIVATE HEALTH INSURANCE | Source: Ambulatory Visit | Attending: Internal Medicine | Admitting: Internal Medicine

## 2011-06-02 DIAGNOSIS — Z1231 Encounter for screening mammogram for malignant neoplasm of breast: Secondary | ICD-10-CM | POA: Insufficient documentation

## 2011-06-03 ENCOUNTER — Other Ambulatory Visit: Payer: Self-pay | Admitting: Internal Medicine

## 2011-06-03 DIAGNOSIS — R928 Other abnormal and inconclusive findings on diagnostic imaging of breast: Secondary | ICD-10-CM

## 2011-06-06 ENCOUNTER — Ambulatory Visit
Admission: RE | Admit: 2011-06-06 | Discharge: 2011-06-06 | Disposition: A | Discharge status: Home / Self Care | Payer: PRIVATE HEALTH INSURANCE | Source: Ambulatory Visit | Attending: Internal Medicine | Admitting: Internal Medicine

## 2011-06-06 DIAGNOSIS — R928 Other abnormal and inconclusive findings on diagnostic imaging of breast: Secondary | ICD-10-CM

## 2011-06-13 ENCOUNTER — Ambulatory Visit (INDEPENDENT_AMBULATORY_CARE_PROVIDER_SITE_OTHER): Payer: PRIVATE HEALTH INSURANCE | Admitting: Internal Medicine

## 2011-06-13 ENCOUNTER — Encounter: Payer: Self-pay | Admitting: Internal Medicine

## 2011-06-13 DIAGNOSIS — N259 Disorder resulting from impaired renal tubular function, unspecified: Secondary | ICD-10-CM

## 2011-06-13 DIAGNOSIS — I10 Essential (primary) hypertension: Secondary | ICD-10-CM

## 2011-06-13 DIAGNOSIS — E119 Type 2 diabetes mellitus without complications: Secondary | ICD-10-CM

## 2011-06-13 DIAGNOSIS — E785 Hyperlipidemia, unspecified: Secondary | ICD-10-CM

## 2011-06-13 DIAGNOSIS — M109 Gout, unspecified: Secondary | ICD-10-CM

## 2011-06-13 MED ORDER — ALLOPURINOL 300 MG PO TABS: 300.0000 mg | ORAL_TABLET | Freq: Every day | ORAL | Status: DC

## 2011-06-13 MED ORDER — LISINOPRIL 20 MG PO TABS: 20.0000 mg | ORAL_TABLET | Freq: Every day | ORAL | Status: DC

## 2011-06-13 MED ORDER — SIMVASTATIN 40 MG PO TABS: 40.0000 mg | ORAL_TABLET | Freq: Every day | ORAL | Status: DC

## 2011-06-13 MED ORDER — POTASSIUM CHLORIDE CRYS ER 20 MEQ PO TBCR: 20.0000 meq | EXTENDED_RELEASE_TABLET | Freq: Every day | ORAL | Status: DC

## 2011-06-13 MED ORDER — METFORMIN HCL 500 MG PO TABS: 500.0000 mg | ORAL_TABLET | Freq: Two times a day (BID) | ORAL | Status: DC

## 2011-06-13 MED ORDER — FREESTYLE FREEDOM LITE W/DEVICE KIT: 1.0000 | PACK | Status: DC

## 2011-06-13 NOTE — Progress Notes (Signed)
  Subjective:    Patient ID: Danielle Atkins, female    DOB: January 15, 1935, 76 y.o.   MRN: [id number]  HPI Wt Readings from Last 3 Encounters:  06/13/11 184 lb (83.462 kg)  02/25/11 185 lb (83.915 kg)  08/05/10 183 lb (83.91 kg)   76 year old patient who is seen today for followup for type 2 diabetes. She has maintained excellent glycemic control. She is on a modest dose of metformin only. She has dyslipidemia controlled with Zocor 40 mg daily she has treated hypertension and a history of gout. There's been no gouty arthritis. She does have a history of peptic ulcer disease. No abdominal pain. Her last hemoglobin A1c was nicely controlled. She denies any cardiopulmonary complaint. No change in weight  Review of Systems  Constitutional: Negative.   HENT: Negative for hearing loss, congestion, sore throat, rhinorrhea, dental problem, sinus pressure and tinnitus.   Eyes: Negative for pain, discharge and visual disturbance.  Respiratory: Negative for cough and shortness of breath.   Cardiovascular: Negative for chest pain, palpitations and leg swelling.  Gastrointestinal: Negative for nausea, vomiting, abdominal pain, diarrhea, constipation, blood in stool and abdominal distention.  Genitourinary: Negative for dysuria, urgency, frequency, hematuria, flank pain, vaginal bleeding, vaginal discharge, difficulty urinating, vaginal pain and pelvic pain.  Musculoskeletal: Negative for joint swelling, arthralgias and gait problem.  Skin: Negative for rash.  Neurological: Negative for dizziness, syncope, speech difficulty, weakness, numbness and headaches.  Hematological: Negative for adenopathy.  Psychiatric/Behavioral: Negative for behavioral problems, dysphoric mood and agitation. The patient is not nervous/anxious.        Objective:   Physical Exam  Constitutional: She is oriented to person, place, and time. She appears well-developed and well-nourished.  HENT:  Head: Normocephalic.  Right Ear:  External ear normal.  Left Ear: External ear normal.  Mouth/Throat: Oropharynx is clear and moist.  Eyes: Conjunctivae and EOM are normal. Pupils are equal, round, and reactive to light.  Neck: Normal range of motion. Neck supple. No thyromegaly present.  Cardiovascular: Normal rate, regular rhythm, normal heart sounds and intact distal pulses.   Pulmonary/Chest: Effort normal and breath sounds normal.  Abdominal: Soft. Bowel sounds are normal. She exhibits no mass. There is no tenderness.  Musculoskeletal: Normal range of motion.  Lymphadenopathy:    She has no cervical adenopathy.  Neurological: She is alert and oriented to person, place, and time.  Skin: Skin is warm and dry. No rash noted.  Psychiatric: She has a normal mood and affect. Her behavior is normal.          Assessment & Plan:    Diabetes mellitus. Well controlled. We'll check a hemoglobin A1c weight loss and exercise are encouraged Hypertension well controlled. We'll continue present regimen Dyslipidemia. We'll continue simvastatin 40  We'll see next visit for her annual exam Lifestyle issues  discussed

## 2011-06-13 NOTE — Patient Instructions (Signed)
You need to lose weight.  Consider a lower calorie diet and regular exercise.   Please check your hemoglobin A1c every 3 months  Limit your sodium (Salt) intake   

## 2011-08-21 ENCOUNTER — Encounter: Payer: Self-pay | Admitting: Internal Medicine

## 2011-08-25 ENCOUNTER — Other Ambulatory Visit (INDEPENDENT_AMBULATORY_CARE_PROVIDER_SITE_OTHER): Payer: PRIVATE HEALTH INSURANCE

## 2011-08-25 DIAGNOSIS — E119 Type 2 diabetes mellitus without complications: Secondary | ICD-10-CM

## 2011-08-25 DIAGNOSIS — I10 Essential (primary) hypertension: Secondary | ICD-10-CM

## 2011-08-25 DIAGNOSIS — Z0000 Encounter for general adult medical examination without abnormal findings: Secondary | ICD-10-CM

## 2011-08-25 LAB — POCT URINALYSIS DIPSTICK
Glucose, UA: NEGATIVE
Leukocytes, UA: NEGATIVE
Protein, UA: NEGATIVE
Spec Grav, UA: 1.02
Urobilinogen, UA: 0.2

## 2011-08-25 LAB — LIPID PANEL
Cholesterol: 141 mg/dL (ref 0–200)
LDL Cholesterol: 71 mg/dL (ref 0–99)
Triglycerides: 113 mg/dL (ref 0.0–149.0)

## 2011-08-25 LAB — BASIC METABOLIC PANEL
BUN: 13 mg/dL (ref 6–23)
CO2: 23 mEq/L (ref 19–32)
Calcium: 9.5 mg/dL (ref 8.4–10.5)
Creatinine, Ser: 1.6 mg/dL — ABNORMAL HIGH (ref 0.4–1.2)
Glucose, Bld: 86 mg/dL (ref 70–99)

## 2011-08-25 LAB — HEPATIC FUNCTION PANEL
Albumin: 4 g/dL (ref 3.5–5.2)
Alkaline Phosphatase: 52 U/L (ref 39–117)
Bilirubin, Direct: 0.1 mg/dL (ref 0.0–0.3)

## 2011-08-25 LAB — CBC WITH DIFFERENTIAL/PLATELET
Basophils Absolute: 0.1 10*3/uL (ref 0.0–0.1)
Eosinophils Absolute: 0.5 10*3/uL (ref 0.0–0.7)
Lymphocytes Relative: 29.4 % (ref 12.0–46.0)
MCHC: 30 g/dL (ref 30.0–36.0)
MCV: 70.3 fl — ABNORMAL LOW (ref 78.0–100.0)
Monocytes Absolute: 0.6 10*3/uL (ref 0.1–1.0)
Neutrophils Relative %: 55.6 % (ref 43.0–77.0)
RDW: 18.4 % — ABNORMAL HIGH (ref 11.5–14.6)

## 2011-09-01 ENCOUNTER — Ambulatory Visit (INDEPENDENT_AMBULATORY_CARE_PROVIDER_SITE_OTHER): Payer: PRIVATE HEALTH INSURANCE | Admitting: Internal Medicine

## 2011-09-01 ENCOUNTER — Encounter: Payer: Self-pay | Admitting: Internal Medicine

## 2011-09-01 DIAGNOSIS — E119 Type 2 diabetes mellitus without complications: Secondary | ICD-10-CM

## 2011-09-01 DIAGNOSIS — I10 Essential (primary) hypertension: Secondary | ICD-10-CM

## 2011-09-01 DIAGNOSIS — Z0000 Encounter for general adult medical examination without abnormal findings: Secondary | ICD-10-CM

## 2011-09-01 DIAGNOSIS — Z23 Encounter for immunization: Secondary | ICD-10-CM

## 2011-09-01 DIAGNOSIS — D649 Anemia, unspecified: Secondary | ICD-10-CM

## 2011-09-01 DIAGNOSIS — N259 Disorder resulting from impaired renal tubular function, unspecified: Secondary | ICD-10-CM

## 2011-09-01 MED ORDER — POTASSIUM CHLORIDE CRYS ER 20 MEQ PO TBCR: 20.0000 meq | EXTENDED_RELEASE_TABLET | Freq: Every day | ORAL | Status: DC

## 2011-09-01 MED ORDER — SIMVASTATIN 40 MG PO TABS: 40.0000 mg | ORAL_TABLET | Freq: Every day | ORAL | Status: DC

## 2011-09-01 MED ORDER — METFORMIN HCL 500 MG PO TABS: 500.0000 mg | ORAL_TABLET | Freq: Two times a day (BID) | ORAL | Status: DC

## 2011-09-01 MED ORDER — LISINOPRIL 20 MG PO TABS: 20.0000 mg | ORAL_TABLET | Freq: Every day | ORAL | Status: DC

## 2011-09-01 MED ORDER — ALLOPURINOL 300 MG PO TABS: 300.0000 mg | ORAL_TABLET | Freq: Every day | ORAL | Status: DC

## 2011-09-01 MED ORDER — FREESTYLE FREEDOM LITE W/DEVICE KIT: 1.0000 | PACK | Status: DC

## 2011-09-01 NOTE — Patient Instructions (Signed)
Limit your sodium (Salt) intake    It is important that you exercise regularly, at least 20 minutes 3 to 4 times per week.  If you develop chest pain or shortness of breath seek  medical attention.   Please check your hemoglobin A1c every 3 months  You need to lose weight.  Consider a lower calorie diet and regular exercise.

## 2011-09-01 NOTE — Progress Notes (Signed)
  Subjective:    Patient ID: Danielle Atkins, female    DOB: Dec 29, 1934, 76 y.o.   MRN: [id number]  HPI History of Present Illness:   76 year-old patient seen today for a comprehensive evaluation. Medical problems include type 2 diabetes, chronic renal insufficiency, history of dyslipidemia. She has a history of a prior TIA. No concerns or complaints today. She has treated hypertension. Her last hemoglobin A1c was well controlled. No focal neurological symptoms. Denies any cardiopulmonary symptoms   Preventive Screening-Counseling & Management  Caffeine-Diet-Exercise  Does Patient Exercise: yes   Allergies:  No Known Drug Allergies   Past History:  Past Medical History:  Reviewed history from 07/03/2008 and no changes required.  Diabetes mellitus, type II  Hyperlipidemia  Hypertension  Anemia-NOS  Peptic ulcer disease  Renal insufficiency  RBBB and left anterior hemiblock  Transient ischemic attack, hx of  history of anemia, secondary to thalassemia trait  history of left brain TIA 2005   Past Surgical History:  Reviewed history from 01/21/2008 and no changes required.  Appendectomy 55  Hysterectomy 80  Tonsillectomy 46  colonoscopy 2003 or 2004   Family History:  Reviewed history from 06/25/2007 and no changes required.  father died at age 45 of the prostate cancer. Mother died in her late 80s of renal cancer  Two brothers, positive for diabetes, and hypertension  Family History Diabetes 1st degree relative  Family History Kidney disease  Family History of Prostate CA 1st degree relative <50   Social History:  Reviewed history from 01/21/2008 and no changes required.  Married  Regular exercise-yes  Does Patient Exercise: yes  1. Risk factors, based on past  M,S,F history- and cardiovascular risk factors include hypertension dyslipidemia and type 2 diabetes. She also has a history of prior TIA.    2.  Physical activities:Remains fairly active does walk frequently. No  exercise limitation   3.  Depression/mood:No history depression or mood disorder   4.  Hearing:No deficits   5.  ADL's:Independent in all aspects of daily living   6.  Fall risk:Low   7.  Home safety:No problems identified   8.  Height weight, and visual acuity; height and weight stable. No visual dysfunction. Has had a recent eye exam with Dr.  Katy Fitch  9.  Counseling:Weight loss regular exercise restricted sodium diet all encouraged   10. Lab orders based on risk factors:Laboratory profile reviewed this did not include a hemoglobin A1c we'll recheck today  11. Referral : Followup mammogram in December as planned 12. Care plan: [name] continue present regimen followup mammogram in December. Will need colonoscopy in one or 2 years   56. Cognitive assessment: Alert and oriented with normal affect no cognitive dysfunction     Review of Systems     Objective:   Physical Exam        Assessment & Plan:

## 2011-11-03 ENCOUNTER — Other Ambulatory Visit: Payer: Self-pay | Admitting: Internal Medicine

## 2011-11-03 DIAGNOSIS — R921 Mammographic calcification found on diagnostic imaging of breast: Secondary | ICD-10-CM

## 2011-11-18 ENCOUNTER — Ambulatory Visit
Admission: RE | Admit: 2011-11-18 | Discharge: 2011-11-18 | Disposition: A | Discharge status: Home / Self Care | Payer: Medicare Other | Source: Ambulatory Visit | Attending: Internal Medicine | Admitting: Internal Medicine

## 2011-11-18 DIAGNOSIS — R921 Mammographic calcification found on diagnostic imaging of breast: Secondary | ICD-10-CM

## 2011-11-26 ENCOUNTER — Encounter: Payer: Self-pay | Admitting: Internal Medicine

## 2011-11-26 ENCOUNTER — Ambulatory Visit (INDEPENDENT_AMBULATORY_CARE_PROVIDER_SITE_OTHER): Payer: Medicare Other | Admitting: Internal Medicine

## 2011-11-26 DIAGNOSIS — I10 Essential (primary) hypertension: Secondary | ICD-10-CM

## 2011-11-26 DIAGNOSIS — M109 Gout, unspecified: Secondary | ICD-10-CM

## 2011-11-26 DIAGNOSIS — E785 Hyperlipidemia, unspecified: Secondary | ICD-10-CM

## 2011-11-26 DIAGNOSIS — E119 Type 2 diabetes mellitus without complications: Secondary | ICD-10-CM

## 2011-11-26 LAB — GLUCOSE, POCT (MANUAL RESULT ENTRY): POC Glucose: 100

## 2011-11-26 LAB — HEMOGLOBIN A1C: Hgb A1c MFr Bld: 6.3 % (ref 4.6–6.5)

## 2011-11-26 NOTE — Progress Notes (Signed)
  Subjective:    Patient ID: MALAYLA BENEDUM, female    DOB: Sep 18, 1935, 76 y.o.   MRN: [id number]  HPI  76 year old patient who is seen today for followup of her type 2 diabetes. This remains extremely well-controlled on low-dose metformin therapy fasting blood sugar today 100 hemoglobin A1c's are generally in the mid to low sixes. She has 2 hypertension which has done quite well on combination therapy. She has a history of gout which has been stable she does have osteoarthritis involving primarily the knees and she is requesting a handicap placard due to knee pain she has a very difficult time walking long distances and negotiating stairs. She has dyslipidemia and remains on simvastatin which he continues to tolerate well. Laboratory update 3 months ago     Review of Systems  Constitutional: Negative.   HENT: Negative for hearing loss, congestion, sore throat, rhinorrhea, dental problem, sinus pressure and tinnitus.   Eyes: Negative for pain, discharge and visual disturbance.  Respiratory: Negative for cough and shortness of breath.   Cardiovascular: Negative for chest pain, palpitations and leg swelling.  Gastrointestinal: Negative for nausea, vomiting, abdominal pain, diarrhea, constipation, blood in stool and abdominal distention.  Genitourinary: Negative for dysuria, urgency, frequency, hematuria, flank pain, vaginal bleeding, vaginal discharge, difficulty urinating, vaginal pain and pelvic pain.  Musculoskeletal: Negative for joint swelling, arthralgias and gait problem (bilateral knee pain).  Skin: Negative for rash.  Neurological: Negative for dizziness, syncope, speech difficulty, weakness, numbness and headaches.  Hematological: Negative for adenopathy.  Psychiatric/Behavioral: Negative for behavioral problems, dysphoric mood and agitation. The patient is not nervous/anxious.        Objective:   Physical Exam  Constitutional: She is oriented to person, place, and time. She appears  well-developed and well-nourished.  HENT:  Head: Normocephalic.  Right Ear: External ear normal.  Left Ear: External ear normal.  Mouth/Throat: Oropharynx is clear and moist.  Eyes: Conjunctivae and EOM are normal. Pupils are equal, round, and reactive to light.  Neck: Normal range of motion. Neck supple. No thyromegaly present.  Cardiovascular: Normal rate, regular rhythm, normal heart sounds and intact distal pulses.   Pulmonary/Chest: Effort normal and breath sounds normal.  Abdominal: Soft. Bowel sounds are normal. She exhibits no mass. There is no tenderness.  Musculoskeletal: Normal range of motion.  Lymphadenopathy:    She has no cervical adenopathy.  Neurological: She is alert and oriented to person, place, and time.  Skin: Skin is warm and dry. No rash noted.  Psychiatric: She has a normal mood and affect. Her behavior is normal.          Assessment & Plan:   Diabetes mellitus well controlled patient has chronic renal insufficiency and creatinine ranges from 1.3-1.6. We'll continue on metformin believed the risk-benefit ratio favors low-dose metformin. We'll check followup renal indices next visit Hypertension well controlled Osteoarthritis knees/ exogenous obesity. Weight loss encouraged will sign a handicap placard Dyslipidemia

## 2011-11-26 NOTE — Patient Instructions (Signed)
Limit your sodium (Salt) intake   Please check your hemoglobin A1c every 3 months    It is important that you exercise regularly, at least 20 minutes 3 to 4 times per week.  If you develop chest pain or shortness of breath seek  medical attention.  You need to lose weight.  Consider a lower calorie diet and regular exercise. 

## 2011-12-01 ENCOUNTER — Ambulatory Visit: Payer: PRIVATE HEALTH INSURANCE | Admitting: Internal Medicine

## 2012-03-03 ENCOUNTER — Ambulatory Visit (INDEPENDENT_AMBULATORY_CARE_PROVIDER_SITE_OTHER): Payer: Medicare Other | Admitting: Internal Medicine

## 2012-03-03 ENCOUNTER — Encounter: Payer: Self-pay | Admitting: Internal Medicine

## 2012-03-03 DIAGNOSIS — E119 Type 2 diabetes mellitus without complications: Secondary | ICD-10-CM

## 2012-03-03 DIAGNOSIS — E785 Hyperlipidemia, unspecified: Secondary | ICD-10-CM

## 2012-03-03 DIAGNOSIS — I10 Essential (primary) hypertension: Secondary | ICD-10-CM

## 2012-03-03 DIAGNOSIS — N259 Disorder resulting from impaired renal tubular function, unspecified: Secondary | ICD-10-CM

## 2012-03-03 LAB — BASIC METABOLIC PANEL
Calcium: 9.1 mg/dL (ref 8.4–10.5)
GFR: 46.37 mL/min — ABNORMAL LOW (ref >60.00)
Glucose, Bld: 83 mg/dL (ref 70–99)
Potassium: 4 mEq/L (ref 3.5–5.1)
Sodium: 140 mEq/L (ref 135–145)

## 2012-03-03 LAB — HEMOGLOBIN A1C: Hgb A1c MFr Bld: 6.4 % (ref 4.6–6.5)

## 2012-03-03 LAB — GLUCOSE, POCT (MANUAL RESULT ENTRY): POC Glucose: 97

## 2012-03-03 NOTE — Progress Notes (Signed)
  Subjective:    Patient ID: Danielle Atkins, female    DOB: 09-29-1935, 77 y.o.   MRN: [id number]  HPI  77 year old patient who has a history of type 2 diabetes. Hemoglobin A1c's have been under tight control with low-dose metformin therapy. She has a history of chronic kidney disease and creatinine levels fluctuate somewhat but had been as high as 1.6. We'll repeat today and is still in this range will discontinue metformin therapy. She has treated hypertension which has been well-controlled as well as a history of dyslipidemia. She remains on simvastatin 40. She has a history of gout which has been stable.    Review of Systems  Constitutional: Negative.   HENT: Negative for hearing loss, congestion, sore throat, rhinorrhea, dental problem, sinus pressure and tinnitus.   Eyes: Negative for pain, discharge and visual disturbance.  Respiratory: Negative for cough and shortness of breath.   Cardiovascular: Negative for chest pain, palpitations and leg swelling.  Gastrointestinal: Negative for nausea, vomiting, abdominal pain, diarrhea, constipation, blood in stool and abdominal distention.  Genitourinary: Negative for dysuria, urgency, frequency, hematuria, flank pain, vaginal bleeding, vaginal discharge, difficulty urinating, vaginal pain and pelvic pain.  Musculoskeletal: Negative for joint swelling, arthralgias and gait problem.  Skin: Negative for rash.  Neurological: Negative for dizziness, syncope, speech difficulty, weakness, numbness and headaches.  Hematological: Negative for adenopathy.  Psychiatric/Behavioral: Negative for behavioral problems, dysphoric mood and agitation. The patient is not nervous/anxious.        Objective:   Physical Exam  Constitutional: She is oriented to person, place, and time. She appears well-developed and well-nourished.  HENT:  Head: Normocephalic.  Right Ear: External ear normal.  Left Ear: External ear normal.  Mouth/Throat: Oropharynx is clear and  moist.  Eyes: Conjunctivae and EOM are normal. Pupils are equal, round, and reactive to light.  Neck: Normal range of motion. Neck supple. No thyromegaly present.  Cardiovascular: Normal rate, regular rhythm, normal heart sounds and intact distal pulses.   Pulmonary/Chest: Effort normal and breath sounds normal.  Abdominal: Soft. Bowel sounds are normal. She exhibits no mass. There is no tenderness.  Musculoskeletal: Normal range of motion.  Lymphadenopathy:    She has no cervical adenopathy.  Neurological: She is alert and oriented to person, place, and time.  Skin: Skin is warm and dry. No rash noted.  Psychiatric: She has a normal mood and affect. Her behavior is normal.          Assessment & Plan:   Diabetes mellitus stable we'll check a hemoglobin A1c Hypertension well controlled Dyslipidemia stable Chronic kidney disease. Last creatinine was up to 1.6. His levels have fluctuated in the past we'll repeat today and if greater than 1.3 we'll consider discontinuation of metformin therapy.  Recheck 3 months

## 2012-03-03 NOTE — Patient Instructions (Signed)
Limit your sodium (Salt) intake    It is important that you exercise regularly, at least 20 minutes 3 to 4 times per week.  If you develop chest pain or shortness of breath seek  medical attention.   Please check your hemoglobin A1c every 3 months   

## 2012-04-14 ENCOUNTER — Other Ambulatory Visit: Payer: Self-pay | Admitting: Internal Medicine

## 2012-04-14 DIAGNOSIS — R921 Mammographic calcification found on diagnostic imaging of breast: Secondary | ICD-10-CM

## 2012-04-21 ENCOUNTER — Other Ambulatory Visit: Payer: Self-pay | Admitting: Internal Medicine

## 2012-04-21 NOTE — Telephone Encounter (Signed)
Pt calling to check status on refill pharmacy sent over. Pt also requesting refill on metformin

## 2012-05-24 ENCOUNTER — Telehealth: Payer: Self-pay | Admitting: Internal Medicine

## 2012-05-24 MED ORDER — LISINOPRIL 20 MG PO TABS: 20.0000 mg | ORAL_TABLET | Freq: Every day | ORAL | Status: DC

## 2012-05-24 NOTE — Telephone Encounter (Signed)
Patient called stating that she need a refill of her lisinopril and she is completely out. Please assist.

## 2012-06-02 ENCOUNTER — Ambulatory Visit
Admission: RE | Admit: 2012-06-02 | Discharge: 2012-06-02 | Disposition: A | Discharge status: Home / Self Care | Payer: Medicare Other | Source: Ambulatory Visit | Attending: Internal Medicine | Admitting: Internal Medicine

## 2012-06-02 DIAGNOSIS — R921 Mammographic calcification found on diagnostic imaging of breast: Secondary | ICD-10-CM

## 2012-06-03 ENCOUNTER — Encounter: Payer: Self-pay | Admitting: Internal Medicine

## 2012-06-03 ENCOUNTER — Ambulatory Visit (INDEPENDENT_AMBULATORY_CARE_PROVIDER_SITE_OTHER): Payer: Medicare Other | Admitting: Internal Medicine

## 2012-06-03 VITALS — BP 110/70 | Temp 97.5°F | Wt 182.0 lb

## 2012-06-03 DIAGNOSIS — I10 Essential (primary) hypertension: Secondary | ICD-10-CM

## 2012-06-03 DIAGNOSIS — E119 Type 2 diabetes mellitus without complications: Secondary | ICD-10-CM

## 2012-06-03 DIAGNOSIS — N259 Disorder resulting from impaired renal tubular function, unspecified: Secondary | ICD-10-CM

## 2012-06-03 MED ORDER — ALLOPURINOL 300 MG PO TABS: 300.0000 mg | ORAL_TABLET | Freq: Every day | ORAL | Status: DC

## 2012-06-03 MED ORDER — SIMVASTATIN 40 MG PO TABS: 40.0000 mg | ORAL_TABLET | Freq: Every day | ORAL | Status: DC

## 2012-06-03 MED ORDER — COLCHICINE 0.6 MG PO TABS: 0.6000 mg | ORAL_TABLET | Freq: Every day | ORAL | Status: DC | PRN

## 2012-06-03 MED ORDER — LISINOPRIL 20 MG PO TABS: 20.0000 mg | ORAL_TABLET | Freq: Every day | ORAL | Status: DC

## 2012-06-03 MED ORDER — METFORMIN HCL 500 MG PO TABS: 500.0000 mg | ORAL_TABLET | Freq: Two times a day (BID) | ORAL | Status: DC

## 2012-06-03 MED ORDER — POTASSIUM CHLORIDE CRYS ER 20 MEQ PO TBCR: 20.0000 meq | EXTENDED_RELEASE_TABLET | Freq: Every day | ORAL | Status: DC

## 2012-06-03 NOTE — Progress Notes (Signed)
  Subjective:    Patient ID: Danielle Atkins, female    DOB: September 16, 1935, 77 y.o.   MRN: [id number]  HPI  77 year old patient who is seen today for followup. She has a history of type 2 diabetes which has been controlled on modest dose metformin she does have mild renal insufficiency but this has been stable over the years. She has a remote history of peptic ulcer disease and remains on daily Tagamet. For the past few months she has noted occasional dysphagia she states that this occurs once or twice per week with both solids and liquids and associated with mild discomfort. Symptoms do not appear to be progressive. She has treated hypertension which has been well-controlled.    Review of Systems  Constitutional: Negative.   HENT: Negative for hearing loss, congestion, sore throat, rhinorrhea, dental problem, sinus pressure and tinnitus.   Eyes: Negative for pain, discharge and visual disturbance.  Respiratory: Negative for cough and shortness of breath.   Cardiovascular: Negative for chest pain, palpitations and leg swelling.  Gastrointestinal: Negative for nausea, vomiting, abdominal pain, diarrhea, constipation, blood in stool and abdominal distention.  Genitourinary: Negative for dysuria, urgency, frequency, hematuria, flank pain, vaginal bleeding, vaginal discharge, difficulty urinating, vaginal pain and pelvic pain.  Musculoskeletal: Negative for joint swelling, arthralgias and gait problem.  Skin: Negative for rash.  Neurological: Negative for dizziness, syncope, speech difficulty, weakness, numbness and headaches.  Hematological: Negative for adenopathy.  Psychiatric/Behavioral: Negative for behavioral problems, dysphoric mood and agitation. The patient is not nervous/anxious.        Objective:   Physical Exam  Constitutional: She is oriented to person, place, and time. She appears well-developed and well-nourished.  HENT:  Head: Normocephalic.  Right Ear: External ear normal.  Left  Ear: External ear normal.  Mouth/Throat: Oropharynx is clear and moist.  Eyes: Conjunctivae and EOM are normal. Pupils are equal, round, and reactive to light.  Neck: Normal range of motion. Neck supple. No thyromegaly present.  Cardiovascular: Normal rate, regular rhythm, normal heart sounds and intact distal pulses.   Pulmonary/Chest: Effort normal and breath sounds normal.  Abdominal: Soft. Bowel sounds are normal. She exhibits no mass. There is no tenderness.  Musculoskeletal: Normal range of motion.  Lymphadenopathy:    She has no cervical adenopathy.  Neurological: She is alert and oriented to person, place, and time.  Skin: Skin is warm and dry. No rash noted.  Psychiatric: She has a normal mood and affect. Her behavior is normal.          Assessment & Plan:   Mild intermittent dysphagia. Appears to be infrequent also occurs with liquids and nonprogressive. Options were discussed we'll observe at this time and evaluate if symptoms do not resolve or become more frequent Hypertension stable Diabetes mellitus. Patient has mild chronic renal insufficiency which has been stable we'll continue metformin therapy but will need to discontinue if there is any worsening of creatinine in the future  Recheck 3 months

## 2012-06-03 NOTE — Patient Instructions (Signed)
Please check your hemoglobin A1c every 3 months  Limit your sodium (Salt) intake  Return in 3 months for follow-up

## 2012-06-04 ENCOUNTER — Emergency Department (HOSPITAL_COMMUNITY)
Admission: EM | Admit: 2012-06-04 | Discharge: 2012-06-04 | Disposition: A | Discharge status: Home / Self Care | Payer: Medicare Other | Attending: Emergency Medicine | Admitting: Emergency Medicine

## 2012-06-04 DIAGNOSIS — M109 Gout, unspecified: Secondary | ICD-10-CM | POA: Insufficient documentation

## 2012-06-04 DIAGNOSIS — R6884 Jaw pain: Secondary | ICD-10-CM | POA: Insufficient documentation

## 2012-06-04 DIAGNOSIS — E785 Hyperlipidemia, unspecified: Secondary | ICD-10-CM | POA: Insufficient documentation

## 2012-06-04 DIAGNOSIS — R599 Enlarged lymph nodes, unspecified: Secondary | ICD-10-CM | POA: Insufficient documentation

## 2012-06-04 DIAGNOSIS — E119 Type 2 diabetes mellitus without complications: Secondary | ICD-10-CM | POA: Insufficient documentation

## 2012-06-04 DIAGNOSIS — I10 Essential (primary) hypertension: Secondary | ICD-10-CM | POA: Insufficient documentation

## 2012-06-04 DIAGNOSIS — K055 Other periodontal diseases: Secondary | ICD-10-CM | POA: Insufficient documentation

## 2012-06-04 DIAGNOSIS — R220 Localized swelling, mass and lump, head: Secondary | ICD-10-CM

## 2012-06-04 DIAGNOSIS — Z79899 Other long term (current) drug therapy: Secondary | ICD-10-CM | POA: Insufficient documentation

## 2012-06-04 DIAGNOSIS — Z8673 Personal history of transient ischemic attack (TIA), and cerebral infarction without residual deficits: Secondary | ICD-10-CM | POA: Insufficient documentation

## 2012-06-04 LAB — GLUCOSE, CAPILLARY: Glucose-Capillary: 101 mg/dL — ABNORMAL HIGH (ref 70–99)

## 2012-06-04 MED ORDER — NAPROXEN 500 MG PO TABS: 500.0000 mg | ORAL_TABLET | Freq: Two times a day (BID) | ORAL | Status: DC

## 2012-06-04 MED ORDER — AMOXICILLIN 500 MG PO CAPS: 500.0000 mg | ORAL_CAPSULE | Freq: Three times a day (TID) | ORAL | Status: AC

## 2012-06-04 MED ORDER — AMOXICILLIN 500 MG PO CAPS
500.0000 mg | ORAL_CAPSULE | Freq: Once | ORAL | Status: AC
  Administered 2012-06-04: 500 mg via ORAL
  Filled 2012-06-04: qty 1

## 2012-06-04 MED ORDER — KETOROLAC TROMETHAMINE 60 MG/2ML IM SOLN
60.0000 mg | Freq: Once | INTRAMUSCULAR | Status: AC
  Administered 2012-06-04: 60 mg via INTRAMUSCULAR
  Filled 2012-06-04: qty 2

## 2012-06-04 NOTE — Discharge Instructions (Signed)
Your gums look swollen and like they have an early infection, please take the medication as prescribed, see the dentist that I have recommended above, call today for an appointment. If you cannot see that provider he may call the list below for dental followup. Return to the hospital for severe or worsening pain, swelling, fevers.  RESOURCE GUIDE  Chronic Pain Problems: Contact [facility]  [phone] Patients need to be referred by their primary care doctor.  Insufficient Money for Medicine: Contact [employer]:  call "211" or [place] [phone].  No Primary Care Doctor: - Call [facility]  [phone] - can help you locate a primary care doctor that  accepts your insurance, provides certain services, etc. - Physician Referral Service- [phone]  Agencies that provide inexpensive medical care: - [facility]  [facility]  [phone] - [facility]  [phone] - [facility]  [phone] - [facility]  [phone] - [facility]  [phone]  [place] Providers: - [facility]- [address], [address]  [phone], Mon-Fri 9am-7pm, Sat 9am-1pm - [place], [address] [place]  [place], [address] [place]  [place]- [address]  [place], [address], [phone]  Only accepts [place] patients after they have their name  applied to their card  Self Pay (no insurance) in [place]: - Sickle Cell Patients: Dr Kevan Ny, [facility]  [place], [facility]- [place]  [facility] [place]- [id number] [place] [address], [facility]- see information above  (Speak to [employer] if you do not have insurance)       -  [facility]- [place], [place],  [place], [place]  Dr Vista Lawman-  [address], [place], [facility]- [address], [phone]       -  [facility]- [address], [place], also [address] [phone]       -    [facility]- [address], [place], 1st & 3rd Saturday   every month, 10am-1pm  1) Find a Doctor and Pay Out of Pocket Although you won't have to find out who is covered by your insurance plan, it is a good idea to ask around and get recommendations. You will then need to call the office and see if the doctor you have chosen will accept you as a new patient and what types of options they offer for patients who are self-pay. Some doctors offer discounts or will set up payment plans for their patients who do not have insurance, but you will need to ask so you aren't surprised when you get to your appointment.  2) Contact Your Local Health Department Not all health departments have doctors that  can see patients for sick visits, but many do, so it is worth a call to see if yours does. If you don't know where your local health department is, you can check in your phone book. The CDC also has a tool to help you locate your state's health department, and many state websites also have listings of all of their local health departments.  3) Find a [facility] If your illness is not likely to be very severe or complicated, you may want to try a walk in clinic. These are popping up all over the country in pharmacies, drugstores, and shopping centers. They're usually staffed by nurse practitioners or physician assistants that have been trained to treat common illnesses and complaints. They're usually fairly quick and  inexpensive. However, if you have serious medical issues or chronic medical problems, these are probably not your best option  STD Testing - [place], [facility], [address] [place], phone [phone] or [phone].  Monday - Friday, call for an appointment. - [place], [facility] Clinic, [address], [place], phone [phone] or [phone].  Monday - Friday, call for an appointment.  Abuse/Neglect: - [place] [phone] - [place] [phone] (After Hours)  Emergency Shelter:  [facility] [phone]  Maternity Homes: - [facility] [place] [phone] - [place] [phone]  MRSA Hotline #:   9018699228  [facility] Dept. [address]         [place] Phone:  [phone]                                  Phone:  [phone]                   Phone:  [phone]  [place], [place] in [place], [address]                                  [place] [phone] or [phone] (After Hours)   [place]  Substance Abuse Resources: - Alcohol and Drug Services  [phone] - [place] [phone] - [facility] [place] [phone] - Residential & Outpatient Substance Abuse Program  [phone]  Psychological Services: - [place]  [place]  [place], [phone] [place]. [place]  [address] [place], [place]: [phone] or [phone], [employer]  Dental Assistance  If unable to pay or uninsured, contact:  [place] or [facility]. to become qualified for the adult dental clinic.  Patients with Medicaid: [facility] [phone] [address], [place] [address], [phone]  If unable to pay, or uninsured, contact [facility] [phone]) or [place] [phone] in [place], [place] in [facility]) to become qualified for the adult dental clinic  Other [place]- [place], [place], [place], [address], [place], [place], 2nd and 4th Thursday of the month at 6:30am.  10 clients each day by appointment, can sometimes see walk-in patients if someone does not show for an appointment. [facility]- [address] [place]ard Danker [place], [place], [address], [place], [place], [place], [address], [place] Department- [place] Department- [place] Department- [phone]

## 2012-06-04 NOTE — ED Provider Notes (Signed)
History     CSN: [id number]  Arrival date & time 06/04/12  [id number]   First MD Initiated Contact with Patient 06/04/12 [phone]      Chief Complaint  Patient presents with  . Jaw Pain    swollen gums    (Consider location/radiation/quality/duration/timing/severity/associated sxs/prior treatment) HPI Comments: 77 year old female with a history of one day of pain in her lower gums and back. This is persistent, constant, gradually getting worse, not associated with fevers chills nausea or vomiting. She states that she recently got over a cold and then started to have the symptoms. She denies any pain with chewing or sensitivity to temperature.  She denies asymmetry to her face but does have some swelling in her bilateral submandibular area.  The history is provided by the patient.    Past Medical History  Diagnosis Date  . ANEMIA-NOS 06/25/2007  . DIABETES MELLITUS, TYPE II 06/25/2007  . GOUT 11/16/2009  . HYPERLIPIDEMIA 06/25/2007  . HYPERTENSION 06/25/2007  . PARESTHESIA, HANDS 03/27/2009  . PEPTIC ULCER DISEASE 06/25/2007  . RENAL INSUFFICIENCY 06/25/2007  . TRANSIENT ISCHEMIC ATTACK, HX OF 06/25/2007    No past surgical history on file.  No family history on file.  History  Substance Use Topics  . Smoking status: Former Smoker    Quit date: 12/16/1999  . Smokeless tobacco: Never Used  . Alcohol Use: No    OB History    Grav Para Term Preterm Abortions TAB SAB Ect Mult Living                  Review of Systems  Constitutional: Negative for fever and chills.  HENT: Positive for facial swelling and dental problem. Negative for sore throat, trouble swallowing and voice change.        Toothache  Gastrointestinal: Negative for nausea and vomiting.    Allergies  Review of patient's allergies indicates no known allergies.  Home Medications   Current Outpatient Rx  Name Route Sig Dispense Refill  . ALLOPURINOL 300 MG PO TABS Oral Take 1 tablet (300 mg total) by mouth daily.  90 tablet 2  . ASPIRIN 325 MG PO TABS Oral Take 325 mg by mouth daily.      Marland Kitchen CIMETIDINE 400 MG PO TABS  TAKE 1 TABLET BY MOUTH TWICE A DAY 180 tablet 1  . LISINOPRIL-HYDROCHLOROTHIAZIDE 20-25 MG PO TABS Oral Take 1 tablet by mouth daily.    Marland Kitchen METFORMIN HCL 500 MG PO TABS Oral Take 1 tablet (500 mg total) by mouth 2 (two) times daily with a meal. 180 tablet 6  . ADULT MULTIVITAMIN W/MINERALS CH Oral Take 1 tablet by mouth daily.    Marland Kitchen POTASSIUM CHLORIDE CRYS ER 20 MEQ PO TBCR Oral Take 1 tablet (20 mEq total) by mouth daily. 90 tablet 6  . PROAIR HFA 108 (90 BASE) MCG/ACT IN AERS  INHALE 2 PUFFS INTO THE LUNGS EVERY 6 (SIX) HOURS AS NEEDED. 18 g 5  . SIMVASTATIN 40 MG PO TABS Oral Take 1 tablet (40 mg total) by mouth daily. 90 tablet 6  . AMOXICILLIN 500 MG PO CAPS Oral Take 1 capsule (500 mg total) by mouth 3 (three) times daily. 21 capsule 0  . FREESTYLE FREEDOM LITE W/DEVICE KIT Does not apply 1 strip by Does not apply route 1 day or 1 dose. 90 each 6  . COLCHICINE 0.6 MG PO TABS Oral Take 1 tablet (0.6 mg total) by mouth daily as needed. 60 tablet 4  . GLUCOSE BLOOD  VI STRP Other 1 each by Other route daily. Use as instructed - freestyle lite meter     . NAPROXEN 500 MG PO TABS Oral Take 1 tablet (500 mg total) by mouth 2 (two) times daily with a meal. 30 tablet 0    BP 146/88  Pulse 106  Temp 97.7 F (36.5 C) (Oral)  Resp 20  Wt 182 lb (82.555 kg)  SpO2 98%  Physical Exam  Nursing note and vitals reviewed. Constitutional: She appears well-developed and well-nourished. No distress.  HENT:  Head: Normocephalic and atraumatic.  Mouth/Throat: Oropharynx is clear and moist. No oropharyngeal exudate.       Dental Disease, very small ulcerations to the lower gums anteriorly underlying central and lateral incisors of the lower jaw. Partial dentures on the upper jaw, mild tenderness to palpation over central incisors on the lower jaw. No asymmetry to the jaw, no tenderness in the sublingual  area, normal tongue protrusion, normal appearance of the tongue. Mucous membranes moist.  Eyes: Conjunctivae are normal. No scleral icterus.  Neck: Normal range of motion. Neck supple. No thyromegaly present.       No cervical lymphadenopathy  Cardiovascular: Normal rate and regular rhythm.   Pulmonary/Chest: Effort normal and breath sounds normal.  Lymphadenopathy:    She has no cervical adenopathy ( Lymphadenopathy present in the submandibular area and submental area. These are mobile, tender, not red. No asymmetry to the neck).  Neurological: She is alert.  Skin: Skin is warm and dry. No rash noted. She is not diaphoretic.    ED Course  Procedures (including critical care time)  Labs Reviewed  GLUCOSE, CAPILLARY - Abnormal; Notable for the following:    Glucose-Capillary 101 (*)     All other components within normal limits   Mm Digital Diagnostic Bilat  06/02/2012  *RADIOLOGY REPORT*  Clinical Data:  The patient returns for short interval follow-up of probably benign calcifications in the left anterior upper outer quadrant and for annual exam on the left.  DIGITAL DIAGNOSTIC BILATERAL MAMMOGRAM WITH CAD  Comparison:  11/18/2011 on the right, 06/06/2011, 06/02/2011, 05/28/2010, 06/04/2009 bilaterally.  Findings:  The breast tissue is almost entirely fatty.  There is no suspicious dominant mass, architectural distortion or calcification to suggest malignancy.  Round smooth calcifications in the right anterior upper outer quadrant are stable and benign in appearance. Mammographic images were processed with CAD.  IMPRESSION: No mammographic evidence of malignancy.  RECOMMENDATION: Yearly screening mammography is suggested.  BI-RADS CATEGORY 1:  Negative.  Original Report Authenticated By: Chaney Born, M.D.     1. Gingival swelling       MDM  Vital signs are normal, pulse of 95 on my exam, afebrile, reactive lymphadenopathy likely from dental disease, patient amenable to  discharge after antibiotics and anti-inflammatories. Dental followup given, encouraged to return for severe or worsening swelling, difficulty breathing or speaking or swallowing or fevers.  Discharge Prescriptions include:  Amoxicillin Naprosyn         Johnna Acosta, MD 06/04/12 [phone]

## 2012-06-04 NOTE — ED Notes (Signed)
Pt c/o swollen gum started 5pm 6/20. Pain 10/10. Pt states she has not been able to sleep

## 2012-08-12 ENCOUNTER — Encounter: Payer: Self-pay | Admitting: Internal Medicine

## 2012-08-12 ENCOUNTER — Ambulatory Visit (INDEPENDENT_AMBULATORY_CARE_PROVIDER_SITE_OTHER): Payer: Medicare Other | Admitting: Internal Medicine

## 2012-08-12 VITALS — BP 110/80 | Temp 98.2°F | Wt 185.0 lb

## 2012-08-12 DIAGNOSIS — I10 Essential (primary) hypertension: Secondary | ICD-10-CM

## 2012-08-12 DIAGNOSIS — E119 Type 2 diabetes mellitus without complications: Secondary | ICD-10-CM

## 2012-08-12 DIAGNOSIS — N259 Disorder resulting from impaired renal tubular function, unspecified: Secondary | ICD-10-CM

## 2012-08-12 LAB — BASIC METABOLIC PANEL
BUN: 13 mg/dL (ref 6–23)
Calcium: 9.3 mg/dL (ref 8.4–10.5)
Creatinine, Ser: 1.6 mg/dL — ABNORMAL HIGH (ref 0.4–1.2)
GFR: 41.86 mL/min — ABNORMAL LOW (ref >60.00)
Glucose, Bld: 102 mg/dL — ABNORMAL HIGH (ref 70–99)
Sodium: 140 mEq/L (ref 135–145)

## 2012-08-12 NOTE — Progress Notes (Signed)
Subjective:    Patient ID: Danielle Atkins, female    DOB: 11-27-1935, 77 y.o.   MRN: [id number]  HPI  77 year old patient who is seen today for followup. She has a history of type 2 diabetes which has been under excellent control on modest dose metformin therapy. She does have a history of chronic kidney disease and last creatinine was 1.4. She has treated hypertension which has been stable as well as dyslipidemia. She is doing quite well without concerns or complaints.  Past Medical History  Diagnosis Date  . ANEMIA-NOS 06/25/2007  . DIABETES MELLITUS, TYPE II 06/25/2007  . GOUT 11/16/2009  . HYPERLIPIDEMIA 06/25/2007  . HYPERTENSION 06/25/2007  . PARESTHESIA, HANDS 03/27/2009  . PEPTIC ULCER DISEASE 06/25/2007  . RENAL INSUFFICIENCY 06/25/2007  . TRANSIENT ISCHEMIC ATTACK, HX OF 06/25/2007    History   Social History  . Marital Status: Widowed    Spouse Name: N/A    Number of Children: N/A  . Years of Education: N/A   Occupational History  . Not on file.   Social History Main Topics  . Smoking status: Former Smoker    Quit date: 12/16/1999  . Smokeless tobacco: Never Used  . Alcohol Use: No  . Drug Use: No  . Sexually Active: Not on file   Other Topics Concern  . Not on file   Social History Narrative  . No narrative on file    No past surgical history on file.  No family history on file.  No Known Allergies  Current Outpatient Prescriptions on File Prior to Visit  Medication Sig Dispense Refill  . allopurinol (ZYLOPRIM) 300 MG tablet Take 1 tablet (300 mg total) by mouth daily.  90 tablet  2  . aspirin 325 MG tablet Take 325 mg by mouth daily.        . Blood Glucose Monitoring Suppl (FREESTYLE FREEDOM LITE) W/DEVICE KIT 1 strip by Does not apply route 1 day or 1 dose.  90 each  6  . cimetidine (TAGAMET) 400 MG tablet TAKE 1 TABLET BY MOUTH TWICE A DAY  180 tablet  1  . colchicine 0.6 MG tablet Take 1 tablet (0.6 mg total) by mouth daily as needed.  60 tablet  4  .  glucose blood test strip 1 each by Other route daily. Use as instructed - freestyle lite meter       . lisinopril-hydrochlorothiazide (PRINZIDE,ZESTORETIC) 20-25 MG per tablet Take 1 tablet by mouth daily.      . metFORMIN (GLUCOPHAGE) 500 MG tablet Take 1 tablet (500 mg total) by mouth 2 (two) times daily with a meal.  180 tablet  6  . Multiple Vitamin (MULTIVITAMIN WITH MINERALS) TABS Take 1 tablet by mouth daily.      . naproxen (NAPROSYN) 500 MG tablet Take 1 tablet (500 mg total) by mouth 2 (two) times daily with a meal.  30 tablet  0  . potassium chloride SA (K-DUR,KLOR-CON) 20 MEQ tablet Take 1 tablet (20 mEq total) by mouth daily.  90 tablet  6  . PROAIR HFA 108 (90 BASE) MCG/ACT inhaler INHALE 2 PUFFS INTO THE LUNGS EVERY 6 (SIX) HOURS AS NEEDED.  18 g  5  . simvastatin (ZOCOR) 40 MG tablet Take 1 tablet (40 mg total) by mouth daily.  90 tablet  6    BP 110/80  Temp 98.2 F (36.8 C) (Oral)  Wt 185 lb (83.915 kg)       Review of Systems  Constitutional: Negative.  HENT: Negative for hearing loss, congestion, sore throat, rhinorrhea, dental problem, sinus pressure and tinnitus.   Eyes: Negative for pain, discharge and visual disturbance.  Respiratory: Negative for cough and shortness of breath.   Cardiovascular: Negative for chest pain, palpitations and leg swelling.  Gastrointestinal: Negative for nausea, vomiting, abdominal pain, diarrhea, constipation, blood in stool and abdominal distention.  Genitourinary: Negative for dysuria, urgency, frequency, hematuria, flank pain, vaginal bleeding, vaginal discharge, difficulty urinating, vaginal pain and pelvic pain.  Musculoskeletal: Negative for joint swelling, arthralgias and gait problem.  Skin: Negative for rash.  Neurological: Negative for dizziness, syncope, speech difficulty, weakness, numbness and headaches.  Hematological: Negative for adenopathy.  Psychiatric/Behavioral: Negative for behavioral problems, dysphoric mood  and agitation. The patient is not nervous/anxious.        Objective:   Physical Exam  Constitutional: She is oriented to person, place, and time. She appears well-developed and well-nourished.       Blood pressure 130/80  HENT:  Head: Normocephalic.  Right Ear: External ear normal.  Left Ear: External ear normal.  Mouth/Throat: Oropharynx is clear and moist.  Eyes: Conjunctivae and EOM are normal. Pupils are equal, round, and reactive to light.  Neck: Normal range of motion. Neck supple. No thyromegaly present.  Cardiovascular: Normal rate, regular rhythm, normal heart sounds and intact distal pulses.   Pulmonary/Chest: Effort normal and breath sounds normal.  Abdominal: Soft. Bowel sounds are normal. She exhibits no mass. There is no tenderness.  Musculoskeletal: Normal range of motion.  Lymphadenopathy:    She has no cervical adenopathy.  Neurological: She is alert and oriented to person, place, and time.  Skin: Skin is warm and dry. No rash noted.  Psychiatric: She has a normal mood and affect. Her behavior is normal.          Assessment & Plan:   Diabetes mellitus. Tight control. We'll check a hemoglobin A1c Hypertension stable Chronic kidney disease. Last creatinine 1.4 we'll check chemistries today. If there is any clinical worsening will need to discontinue metformin therapy and consider alternate treatment  Recheck 4 months

## 2012-08-12 NOTE — Patient Instructions (Signed)
Limit your sodium (Salt) intake    It is important that you exercise regularly, at least 20 minutes 3 to 4 times per week.  If you develop chest pain or shortness of breath seek  medical attention.  You need to lose weight.  Consider a lower calorie diet and regular exercise. 

## 2012-09-03 ENCOUNTER — Ambulatory Visit: Payer: Medicare Other | Admitting: Internal Medicine

## 2012-10-14 ENCOUNTER — Telehealth: Payer: Self-pay | Admitting: Internal Medicine

## 2012-10-14 MED ORDER — POTASSIUM CHLORIDE CRYS ER 20 MEQ PO TBCR: 20.0000 meq | EXTENDED_RELEASE_TABLET | Freq: Every day | ORAL | Status: DC

## 2012-10-14 MED ORDER — SIMVASTATIN 40 MG PO TABS: 40.0000 mg | ORAL_TABLET | Freq: Every day | ORAL | Status: DC

## 2012-10-14 MED ORDER — LISINOPRIL-HYDROCHLOROTHIAZIDE 20-25 MG PO TABS: 1.0000 | ORAL_TABLET | Freq: Every day | ORAL | Status: DC

## 2012-10-14 MED ORDER — ALBUTEROL SULFATE HFA 108 (90 BASE) MCG/ACT IN AERS: 2.0000 | INHALATION_SPRAY | Freq: Four times a day (QID) | RESPIRATORY_TRACT | Status: DC | PRN

## 2012-10-14 MED ORDER — CIMETIDINE 400 MG PO TABS: 400.0000 mg | ORAL_TABLET | Freq: Two times a day (BID) | ORAL | Status: DC

## 2012-10-14 MED ORDER — METFORMIN HCL 500 MG PO TABS: 500.0000 mg | ORAL_TABLET | Freq: Two times a day (BID) | ORAL | Status: DC

## 2012-10-14 MED ORDER — NAPROXEN 500 MG PO TABS: 500.0000 mg | ORAL_TABLET | Freq: Two times a day (BID) | ORAL | Status: DC

## 2012-10-14 MED ORDER — ALLOPURINOL 300 MG PO TABS: 300.0000 mg | ORAL_TABLET | Freq: Every day | ORAL | Status: DC

## 2012-10-14 NOTE — Telephone Encounter (Signed)
[facility]- [facility] [place] - called phone# - address matches. KIK

## 2012-10-14 NOTE — Telephone Encounter (Signed)
Pt called and is in [place]. Pt is needing a refill of all current meds sent to [facility] on [address] [phone].

## 2012-10-18 ENCOUNTER — Telehealth: Payer: Self-pay | Admitting: Internal Medicine

## 2012-10-18 MED ORDER — SIMVASTATIN 40 MG PO TABS: 40.0000 mg | ORAL_TABLET | Freq: Every day | ORAL | Status: DC

## 2012-10-18 MED ORDER — CIMETIDINE 400 MG PO TABS: 400.0000 mg | ORAL_TABLET | Freq: Two times a day (BID) | ORAL | Status: DC

## 2012-10-18 MED ORDER — METFORMIN HCL 500 MG PO TABS: 500.0000 mg | ORAL_TABLET | Freq: Two times a day (BID) | ORAL | Status: DC

## 2012-10-18 MED ORDER — LISINOPRIL-HYDROCHLOROTHIAZIDE 20-25 MG PO TABS: 1.0000 | ORAL_TABLET | Freq: Every day | ORAL | Status: DC

## 2012-10-18 MED ORDER — NAPROXEN 500 MG PO TABS: 500.0000 mg | ORAL_TABLET | Freq: Two times a day (BID) | ORAL | Status: DC

## 2012-10-18 MED ORDER — COLCHICINE 0.6 MG PO TABS: 0.6000 mg | ORAL_TABLET | Freq: Every day | ORAL | Status: DC | PRN

## 2012-10-18 MED ORDER — ALBUTEROL SULFATE HFA 108 (90 BASE) MCG/ACT IN AERS: 2.0000 | INHALATION_SPRAY | Freq: Four times a day (QID) | RESPIRATORY_TRACT | Status: DC | PRN

## 2012-10-18 MED ORDER — ALLOPURINOL 300 MG PO TABS: 300.0000 mg | ORAL_TABLET | Freq: Every day | ORAL | Status: DC

## 2012-10-18 MED ORDER — POTASSIUM CHLORIDE CRYS ER 20 MEQ PO TBCR: 20.0000 meq | EXTENDED_RELEASE_TABLET | Freq: Every day | ORAL | Status: DC

## 2012-10-18 NOTE — Telephone Encounter (Signed)
Pt called and said that the script were sent in to correct phamacy, but they were only sent in for a 30 day supply and it should have been for #90. Pls call the pharmacy and get this changed[phone] Pls notify pt when done.

## 2012-12-13 ENCOUNTER — Encounter: Payer: Self-pay | Admitting: Internal Medicine

## 2012-12-13 ENCOUNTER — Ambulatory Visit (INDEPENDENT_AMBULATORY_CARE_PROVIDER_SITE_OTHER): Payer: Medicare Other | Admitting: Internal Medicine

## 2012-12-13 VITALS — BP 118/80 | HR 92 | Temp 97.7°F | Resp 18 | Wt 184.0 lb

## 2012-12-13 DIAGNOSIS — E119 Type 2 diabetes mellitus without complications: Secondary | ICD-10-CM

## 2012-12-13 DIAGNOSIS — E785 Hyperlipidemia, unspecified: Secondary | ICD-10-CM

## 2012-12-13 DIAGNOSIS — N259 Disorder resulting from impaired renal tubular function, unspecified: Secondary | ICD-10-CM

## 2012-12-13 DIAGNOSIS — I10 Essential (primary) hypertension: Secondary | ICD-10-CM

## 2012-12-13 MED ORDER — LISINOPRIL-HYDROCHLOROTHIAZIDE 20-25 MG PO TABS: 1.0000 | ORAL_TABLET | Freq: Every day | ORAL | Status: DC

## 2012-12-13 MED ORDER — METFORMIN HCL 500 MG PO TABS: 500.0000 mg | ORAL_TABLET | Freq: Two times a day (BID) | ORAL | Status: DC

## 2012-12-13 MED ORDER — SIMVASTATIN 40 MG PO TABS: 40.0000 mg | ORAL_TABLET | Freq: Every day | ORAL | Status: DC

## 2012-12-13 MED ORDER — ALBUTEROL SULFATE HFA 108 (90 BASE) MCG/ACT IN AERS: 2.0000 | INHALATION_SPRAY | Freq: Four times a day (QID) | RESPIRATORY_TRACT | Status: DC | PRN

## 2012-12-13 MED ORDER — ALLOPURINOL 300 MG PO TABS: 300.0000 mg | ORAL_TABLET | Freq: Every day | ORAL | Status: DC

## 2012-12-13 MED ORDER — POTASSIUM CHLORIDE CRYS ER 20 MEQ PO TBCR: 20.0000 meq | EXTENDED_RELEASE_TABLET | Freq: Every day | ORAL | Status: DC

## 2012-12-13 NOTE — Patient Instructions (Signed)
Please check your hemoglobin A1c every 3 months  Limit your sodium (Salt) intake    It is important that you exercise regularly, at least 20 minutes 3 to 4 times per week.  If you develop chest pain or shortness of breath seek  medical attention.   

## 2012-12-13 NOTE — Progress Notes (Signed)
Subjective:    Patient ID: Danielle Atkins, female    DOB: Dec 12, 1935, 77 y.o.   MRN: [id number]  HPI  77 year old patient who is seen today for her quarterly followup. She has type 2 diabetes which has been very well-controlled.  She has treated hypertension and dyslipidemia. She denies any cardiopulmonary complaints. He has a history of gout but this has been  Asymptomatic  for a number of years. She has chronic kidney disease. No new concerns or complaints. She remains very active with travel.  Past Medical History  Diagnosis Date  . ANEMIA-NOS 06/25/2007  . DIABETES MELLITUS, TYPE II 06/25/2007  . GOUT 11/16/2009  . HYPERLIPIDEMIA 06/25/2007  . HYPERTENSION 06/25/2007  . PARESTHESIA, HANDS 03/27/2009  . PEPTIC ULCER DISEASE 06/25/2007  . RENAL INSUFFICIENCY 06/25/2007  . TRANSIENT ISCHEMIC ATTACK, HX OF 06/25/2007    History   Social History  . Marital Status: Widowed    Spouse Name: N/A    Number of Children: N/A  . Years of Education: N/A   Occupational History  . Not on file.   Social History Main Topics  . Smoking status: Former Smoker    Quit date: 12/16/1999  . Smokeless tobacco: Never Used  . Alcohol Use: No  . Drug Use: No  . Sexually Active: Not on file   Other Topics Concern  . Not on file   Social History Narrative  . No narrative on file    No past surgical history on file.  No family history on file.  No Known Allergies  Current Outpatient Prescriptions on File Prior to Visit  Medication Sig Dispense Refill  . albuterol (PROAIR HFA) 108 (90 BASE) MCG/ACT inhaler Inhale 2 puffs into the lungs every 6 (six) hours as needed for wheezing.  18 g  1  . allopurinol (ZYLOPRIM) 300 MG tablet Take 1 tablet (300 mg total) by mouth daily.  90 tablet  0  . aspirin 325 MG tablet Take 325 mg by mouth daily.        . Blood Glucose Monitoring Suppl (FREESTYLE FREEDOM LITE) W/DEVICE KIT 1 strip by Does not apply route 1 day or 1 dose.  90 each  6  . cimetidine (TAGAMET)  400 MG tablet Take 1 tablet (400 mg total) by mouth 2 (two) times daily.  180 tablet  0  . colchicine 0.6 MG tablet Take 1 tablet (0.6 mg total) by mouth daily as needed.  60 tablet  4  . glucose blood test strip 1 each by Other route daily. Use as instructed - freestyle lite meter       . lisinopril-hydrochlorothiazide (PRINZIDE,ZESTORETIC) 20-25 MG per tablet Take 1 tablet by mouth daily.  90 tablet  0  . metFORMIN (GLUCOPHAGE) 500 MG tablet Take 1 tablet (500 mg total) by mouth 2 (two) times daily with a meal.  180 tablet  0  . Multiple Vitamin (MULTIVITAMIN WITH MINERALS) TABS Take 1 tablet by mouth daily.      . naproxen (NAPROSYN) 500 MG tablet Take 1 tablet (500 mg total) by mouth 2 (two) times daily with a meal.  180 tablet  0  . potassium chloride SA (K-DUR,KLOR-CON) 20 MEQ tablet Take 1 tablet (20 mEq total) by mouth daily.  90 tablet  0  . simvastatin (ZOCOR) 40 MG tablet Take 1 tablet (40 mg total) by mouth daily.  90 tablet  0    BP 118/80  Pulse 92  Temp 97.7 F (36.5 C) (Oral)  Resp 18  Wt 184 lb (83.462 kg)  SpO2 97%      Review of Systems  Constitutional: Negative.   HENT: Negative for hearing loss, congestion, sore throat, rhinorrhea, dental problem, sinus pressure and tinnitus.   Eyes: Negative for pain, discharge and visual disturbance.  Respiratory: Negative for cough and shortness of breath.   Cardiovascular: Negative for chest pain, palpitations and leg swelling.  Gastrointestinal: Negative for nausea, vomiting, abdominal pain, diarrhea, constipation, blood in stool and abdominal distention.  Genitourinary: Negative for dysuria, urgency, frequency, hematuria, flank pain, vaginal bleeding, vaginal discharge, difficulty urinating, vaginal pain and pelvic pain.  Musculoskeletal: Negative for joint swelling, arthralgias and gait problem.  Skin: Negative for rash.  Neurological: Negative for dizziness, syncope, speech difficulty, weakness, numbness and headaches.    Hematological: Negative for adenopathy.  Psychiatric/Behavioral: Negative for behavioral problems, dysphoric mood and agitation. The patient is not nervous/anxious.        Objective:   Physical Exam  Constitutional: She is oriented to person, place, and time. She appears well-developed and well-nourished.  HENT:  Head: Normocephalic.  Right Ear: External ear normal.  Left Ear: External ear normal.  Mouth/Throat: Oropharynx is clear and moist.  Eyes: Conjunctivae normal and EOM are normal. Pupils are equal, round, and reactive to light.  Neck: Normal range of motion. Neck supple. No thyromegaly present.  Cardiovascular: Normal rate, regular rhythm and normal heart sounds.   Pulmonary/Chest: Effort normal and breath sounds normal.  Abdominal: Soft. Bowel sounds are normal. She exhibits no mass. There is no tenderness.  Musculoskeletal: Normal range of motion.  Lymphadenopathy:    She has no cervical adenopathy.  Neurological: She is alert and oriented to person, place, and time.  Skin: Skin is warm and dry. No rash noted.  Psychiatric: She has a normal mood and affect. Her behavior is normal.          Assessment & Plan:   Diabetes mellitus. Will check a hemoglobin A1c Hypertension nice control Dyslipidemia Chronic kidney disease History of gout stable  Recheck 3 months All medications refilled

## 2012-12-31 ENCOUNTER — Encounter: Payer: Self-pay | Admitting: Internal Medicine

## 2013-01-11 ENCOUNTER — Other Ambulatory Visit: Payer: Self-pay | Admitting: Internal Medicine

## 2013-01-11 NOTE — Telephone Encounter (Signed)
Pt needs refill of cimetidine (TAGAMET) 400 MG tablet. (90 day supply) [facility] [address]

## 2013-04-12 ENCOUNTER — Encounter: Payer: Self-pay | Admitting: Internal Medicine

## 2013-04-12 ENCOUNTER — Other Ambulatory Visit: Payer: Self-pay | Admitting: Internal Medicine

## 2013-04-12 ENCOUNTER — Ambulatory Visit (INDEPENDENT_AMBULATORY_CARE_PROVIDER_SITE_OTHER): Payer: Medicare Other | Admitting: Internal Medicine

## 2013-04-12 VITALS — BP 110/72 | HR 97 | Temp 98.0°F | Resp 18 | Wt 181.0 lb

## 2013-04-12 DIAGNOSIS — E119 Type 2 diabetes mellitus without complications: Secondary | ICD-10-CM

## 2013-04-12 DIAGNOSIS — Z1231 Encounter for screening mammogram for malignant neoplasm of breast: Secondary | ICD-10-CM

## 2013-04-12 DIAGNOSIS — N259 Disorder resulting from impaired renal tubular function, unspecified: Secondary | ICD-10-CM

## 2013-04-12 DIAGNOSIS — E785 Hyperlipidemia, unspecified: Secondary | ICD-10-CM

## 2013-04-12 DIAGNOSIS — I10 Essential (primary) hypertension: Secondary | ICD-10-CM

## 2013-04-12 MED ORDER — LISINOPRIL-HYDROCHLOROTHIAZIDE 20-25 MG PO TABS: 1.0000 | ORAL_TABLET | Freq: Every day | ORAL | Status: DC

## 2013-04-12 MED ORDER — SIMVASTATIN 40 MG PO TABS: 40.0000 mg | ORAL_TABLET | Freq: Every day | ORAL | Status: DC

## 2013-04-12 MED ORDER — POTASSIUM CHLORIDE CRYS ER 20 MEQ PO TBCR: 20.0000 meq | EXTENDED_RELEASE_TABLET | Freq: Every day | ORAL | Status: DC

## 2013-04-12 MED ORDER — CIMETIDINE 400 MG PO TABS: ORAL_TABLET | ORAL | Status: DC

## 2013-04-12 MED ORDER — COLCHICINE 0.6 MG PO TABS: 0.6000 mg | ORAL_TABLET | Freq: Every day | ORAL | Status: DC | PRN

## 2013-04-12 MED ORDER — METFORMIN HCL 500 MG PO TABS: 500.0000 mg | ORAL_TABLET | Freq: Two times a day (BID) | ORAL | Status: DC

## 2013-04-12 MED ORDER — ALLOPURINOL 300 MG PO TABS: 300.0000 mg | ORAL_TABLET | Freq: Every day | ORAL | Status: DC

## 2013-04-12 MED ORDER — ALBUTEROL SULFATE HFA 108 (90 BASE) MCG/ACT IN AERS: 2.0000 | INHALATION_SPRAY | Freq: Four times a day (QID) | RESPIRATORY_TRACT | Status: DC | PRN

## 2013-04-12 MED ORDER — GLUCOSE BLOOD VI STRP: 1.0000 | ORAL_STRIP | Freq: Every day | Status: DC | PRN

## 2013-04-12 NOTE — Patient Instructions (Signed)
Please check your hemoglobin A1c every 3 months    It is important that you exercise regularly, at least 20 minutes 3 to 4 times per week.  If you develop chest pain or shortness of breath seek  medical attention.  Limit your sodium (Salt) intake   

## 2013-04-12 NOTE — Progress Notes (Signed)
Subjective:    Patient ID: Danielle Atkins, female    DOB: [birth year], 78 y.o.   MRN: [id number]  HPI  78 year old patient who has type 2 diabetes dyslipidemia and hypertension. She has chronic kidney disease. She has done remarkably well at 78.  No cardiopulmonary complaints.  Past Medical History  Diagnosis Date  . ANEMIA-NOS 06/25/2007  . DIABETES MELLITUS, TYPE II 06/25/2007  . GOUT 11/16/2009  . HYPERLIPIDEMIA 06/25/2007  . HYPERTENSION 06/25/2007  . PARESTHESIA, HANDS 03/27/2009  . PEPTIC ULCER DISEASE 06/25/2007  . RENAL INSUFFICIENCY 06/25/2007  . TRANSIENT ISCHEMIC ATTACK, HX OF 06/25/2007    History   Social History  . Marital Status: Widowed    Spouse Name: N/A    Number of Children: N/A  . Years of Education: N/A   Occupational History  . Not on file.   Social History Main Topics  . Smoking status: Former Smoker    Quit date: 12/16/1999  . Smokeless tobacco: Never Used  . Alcohol Use: No  . Drug Use: No  . Sexually Active: Not on file   Other Topics Concern  . Not on file   Social History Narrative  . No narrative on file    History reviewed. No pertinent past surgical history.  No family history on file.  No Known Allergies  Current Outpatient Prescriptions on File Prior to Visit  Medication Sig Dispense Refill  . aspirin 325 MG tablet Take 325 mg by mouth daily.        . Multiple Vitamin (MULTIVITAMIN WITH MINERALS) TABS Take 1 tablet by mouth daily.       No current facility-administered medications on file prior to visit.    BP 110/72  Pulse 97  Temp(Src) 98 F (36.7 C) (Oral)  Resp 18  Wt 181 lb (82.101 kg)  BMI 31.05 kg/m2  SpO2 98%       Review of Systems  Constitutional: Negative.   HENT: Negative for hearing loss, congestion, sore throat, rhinorrhea, dental problem, sinus pressure and tinnitus.   Eyes: Negative for pain, discharge and visual disturbance.  Respiratory: Negative for cough and shortness of breath.   Cardiovascular:  Negative for chest pain, palpitations and leg swelling.  Gastrointestinal: Negative for nausea, vomiting, abdominal pain, diarrhea, constipation, blood in stool and abdominal distention.  Genitourinary: Negative for dysuria, urgency, frequency, hematuria, flank pain, vaginal bleeding, vaginal discharge, difficulty urinating, vaginal pain and pelvic pain.  Musculoskeletal: Negative for joint swelling, arthralgias (Bilateral shoulder pain) and gait problem.  Skin: Negative for rash.  Neurological: Negative for dizziness, syncope, speech difficulty, weakness, numbness and headaches.  Hematological: Negative for adenopathy.  Psychiatric/Behavioral: Negative for behavioral problems, dysphoric mood and agitation. The patient is not nervous/anxious.        Objective:   Physical Exam  Constitutional: She is oriented to person, place, and time. She appears well-developed and well-nourished.  HENT:  Head: Normocephalic.  Right Ear: External ear normal.  Left Ear: External ear normal.  Mouth/Throat: Oropharynx is clear and moist.  Eyes: Conjunctivae and EOM are normal. Pupils are equal, round, and reactive to light.  Neck: Normal range of motion. Neck supple. No thyromegaly present.  Cardiovascular: Normal rate, regular rhythm, normal heart sounds and intact distal pulses.   Pulmonary/Chest: Effort normal and breath sounds normal.  Abdominal: Soft. Bowel sounds are normal. She exhibits no mass. There is no tenderness.  Musculoskeletal: Normal range of motion.  Lymphadenopathy:    She has no cervical adenopathy.  Neurological: She is  alert and oriented to person, place, and time.  Skin: Skin is warm and dry. No rash noted.  Psychiatric: She has a normal mood and affect. Her behavior is normal.          Assessment & Plan:   Hypertension well controlled Diabetes mellitus. Will check a hemoglobin A1c Chronic kidney disease Dyslipidemia. Continue statin therapy  Recheck 3 months

## 2013-04-13 ENCOUNTER — Ambulatory Visit: Payer: Medicare Other | Admitting: Internal Medicine

## 2013-06-03 ENCOUNTER — Ambulatory Visit (HOSPITAL_COMMUNITY): Payer: Medicare Other

## 2013-07-06 ENCOUNTER — Ambulatory Visit (HOSPITAL_COMMUNITY)
Admission: RE | Admit: 2013-07-06 | Discharge: 2013-07-06 | Disposition: A | Discharge status: Home / Self Care | Payer: Medicare Other | Source: Ambulatory Visit | Attending: Internal Medicine | Admitting: Internal Medicine

## 2013-07-06 DIAGNOSIS — Z1231 Encounter for screening mammogram for malignant neoplasm of breast: Secondary | ICD-10-CM | POA: Insufficient documentation

## 2013-07-12 ENCOUNTER — Encounter: Payer: Self-pay | Admitting: Internal Medicine

## 2013-07-12 ENCOUNTER — Ambulatory Visit (INDEPENDENT_AMBULATORY_CARE_PROVIDER_SITE_OTHER): Payer: Medicare Other | Admitting: Internal Medicine

## 2013-07-12 VITALS — BP 122/80 | HR 97 | Temp 97.8°F | Resp 20 | Wt 181.0 lb

## 2013-07-12 DIAGNOSIS — Z23 Encounter for immunization: Secondary | ICD-10-CM

## 2013-07-12 DIAGNOSIS — I10 Essential (primary) hypertension: Secondary | ICD-10-CM

## 2013-07-12 DIAGNOSIS — E785 Hyperlipidemia, unspecified: Secondary | ICD-10-CM

## 2013-07-12 DIAGNOSIS — E119 Type 2 diabetes mellitus without complications: Secondary | ICD-10-CM

## 2013-07-12 DIAGNOSIS — M109 Gout, unspecified: Secondary | ICD-10-CM

## 2013-07-12 LAB — COMPREHENSIVE METABOLIC PANEL
ALT: 16 U/L (ref 0–35)
Albumin: 3.9 g/dL (ref 3.5–5.2)
CO2: 25 mEq/L (ref 19–32)
Calcium: 9.9 mg/dL (ref 8.4–10.5)
Chloride: 107 mEq/L (ref 96–112)
Creatinine, Ser: 2.5 mg/dL — ABNORMAL HIGH (ref 0.4–1.2)
GFR: 24.62 mL/min — ABNORMAL LOW (ref >60.00)
Potassium: 4.1 mEq/L (ref 3.5–5.1)
Sodium: 139 mEq/L (ref 135–145)
Total Protein: 7.2 g/dL (ref 6.0–8.3)

## 2013-07-12 LAB — MICROALBUMIN / CREATININE URINE RATIO
Creatinine,U: 289 mg/dL
Microalb Creat Ratio: 0.5 mg/g (ref 0.0–30.0)
Microalb, Ur: 1.5 mg/dL (ref 0.0–1.9)

## 2013-07-12 LAB — CBC WITH DIFFERENTIAL/PLATELET
Eosinophils Absolute: 0.5 10*3/uL (ref 0.0–0.7)
HCT: 33.1 % — ABNORMAL LOW (ref 36.0–46.0)
Lymphs Abs: 2.5 10*3/uL (ref 0.7–4.0)
MCHC: 30.3 g/dL (ref 30.0–36.0)
MCV: 71.1 fl — ABNORMAL LOW (ref 78.0–100.0)
Monocytes Absolute: 0.6 10*3/uL (ref 0.1–1.0)
Neutrophils Relative %: 50 % (ref 43.0–77.0)
Platelets: 291 10*3/uL (ref 150.0–400.0)
RDW: 18.7 % — ABNORMAL HIGH (ref 11.5–14.6)

## 2013-07-12 LAB — TSH: TSH: 3.48 u[IU]/mL (ref 0.35–5.50)

## 2013-07-12 LAB — LIPID PANEL: HDL: 47.2 mg/dL (ref >39.00)

## 2013-07-12 NOTE — Patient Instructions (Addendum)
Limit your sodium (Salt) intake   Please check your hemoglobin A1c every 3 months  Please check your blood pressure on a regular basis.  If it is consistently greater than 150/90, please make an office appointment.    It is important that you exercise regularly, at least 20 minutes 3 to 4 times per week.  If you develop chest pain or shortness of breath seek  medical attention.  You need to lose weight.  Consider a lower calorie diet and regular exercise. 

## 2013-07-12 NOTE — Progress Notes (Signed)
  Subjective:    Patient ID: Danielle Atkins, female    DOB: [birth year], 78 y.o.   MRN: [id number]  HPI  Wt Readings from Last 3 Encounters:  07/12/13 181 lb (82.101 kg)  04/12/13 181 lb (82.101 kg)  12/13/12 184 lb (83.462 kg)    Lab Results  Component Value Date   HGBA1C 6.6* 04/12/2013    Review of Systems     Objective:   Physical Exam        Assessment & Plan:

## 2013-07-12 NOTE — Progress Notes (Signed)
Subjective:    Patient ID: Danielle Atkins, female    DOB: May 04, 1935, 78 y.o.   MRN: [id number]  HPI  78 year old patient who is seen today for followup. She has a history of type 2 diabetes that has been very well-controlled. She did have a diabetic eye exam in January of this year. She has a history of hypertension which has done well. She has dyslipidemia which has been controlled on simvastatin. She has a history of gout which has been stable. No concerns or complaints. No recent lab.  Past Medical History  Diagnosis Date  . ANEMIA-NOS 06/25/2007  . DIABETES MELLITUS, TYPE II 06/25/2007  . GOUT 11/16/2009  . HYPERLIPIDEMIA 06/25/2007  . HYPERTENSION 06/25/2007  . PARESTHESIA, HANDS 03/27/2009  . PEPTIC ULCER DISEASE 06/25/2007  . RENAL INSUFFICIENCY 06/25/2007  . TRANSIENT ISCHEMIC ATTACK, HX OF 06/25/2007    History   Social History  . Marital Status: Widowed    Spouse Name: N/A    Number of Children: N/A  . Years of Education: N/A   Occupational History  . Not on file.   Social History Main Topics  . Smoking status: Former Smoker    Quit date: 12/16/1999  . Smokeless tobacco: Never Used  . Alcohol Use: No  . Drug Use: No  . Sexually Active: Not on file   Other Topics Concern  . Not on file   Social History Narrative  . No narrative on file    History reviewed. No pertinent past surgical history.  No family history on file.  No Known Allergies  Current Outpatient Prescriptions on File Prior to Visit  Medication Sig Dispense Refill  . albuterol (PROAIR HFA) 108 (90 BASE) MCG/ACT inhaler Inhale 2 puffs into the lungs every 6 (six) hours as needed for wheezing.  18 g  2  . allopurinol (ZYLOPRIM) 300 MG tablet Take 1 tablet (300 mg total) by mouth daily.  90 tablet  1  . aspirin 325 MG tablet Take 325 mg by mouth daily.        . cimetidine (TAGAMET) 400 MG tablet TAKE 1 TABLET TWICE DAILY  180 tablet  1  . colchicine 0.6 MG tablet Take 1 tablet (0.6 mg total) by mouth  daily as needed.  60 tablet  4  . glucose blood test strip 1 each by Other route daily as needed for other.  100 each  12  . lisinopril-hydrochlorothiazide (PRINZIDE,ZESTORETIC) 20-25 MG per tablet Take 1 tablet by mouth daily.  90 tablet  1  . metFORMIN (GLUCOPHAGE) 500 MG tablet Take 1 tablet (500 mg total) by mouth 2 (two) times daily with a meal.  180 tablet  1  . Multiple Vitamin (MULTIVITAMIN WITH MINERALS) TABS Take 1 tablet by mouth daily.      . potassium chloride SA (K-DUR,KLOR-CON) 20 MEQ tablet Take 1 tablet (20 mEq total) by mouth daily.  90 tablet  1  . simvastatin (ZOCOR) 40 MG tablet Take 1 tablet (40 mg total) by mouth daily.  90 tablet  1   No current facility-administered medications on file prior to visit.    BP 122/80  Pulse 97  Temp(Src) 97.8 F (36.6 C) (Oral)  Resp 20  Wt 181 lb (82.101 kg)  BMI 31.05 kg/m2  SpO2 97%       Review of Systems  Constitutional: Negative.   HENT: Negative for hearing loss, congestion, sore throat, rhinorrhea, dental problem, sinus pressure and tinnitus.   Eyes: Negative for pain, discharge and  visual disturbance.  Respiratory: Negative for cough and shortness of breath.   Cardiovascular: Negative for chest pain, palpitations and leg swelling.  Gastrointestinal: Negative for nausea, vomiting, abdominal pain, diarrhea, constipation, blood in stool and abdominal distention.  Genitourinary: Negative for dysuria, urgency, frequency, hematuria, flank pain, vaginal bleeding, vaginal discharge, difficulty urinating, vaginal pain and pelvic pain.  Musculoskeletal: Negative for joint swelling, arthralgias and gait problem.  Skin: Negative for rash.  Neurological: Negative for dizziness, syncope, speech difficulty, weakness, numbness and headaches.  Hematological: Negative for adenopathy.  Psychiatric/Behavioral: Negative for behavioral problems, dysphoric mood and agitation. The patient is not nervous/anxious.        Objective:    Physical Exam  Constitutional: She is oriented to person, place, and time. She appears well-developed and well-nourished.  HENT:  Head: Normocephalic.  Right Ear: External ear normal.  Left Ear: External ear normal.  Mouth/Throat: Oropharynx is clear and moist.  Eyes: Conjunctivae and EOM are normal. Pupils are equal, round, and reactive to light.  Neck: Normal range of motion. Neck supple. No thyromegaly present.  Cardiovascular: Normal rate, regular rhythm, normal heart sounds and intact distal pulses.   Pulmonary/Chest: Effort normal and breath sounds normal.  Abdominal: Soft. Bowel sounds are normal. She exhibits no mass. There is no tenderness.  Musculoskeletal: Normal range of motion.  Lymphadenopathy:    She has no cervical adenopathy.  Neurological: She is alert and oriented to person, place, and time.  Skin: Skin is warm and dry. No rash noted.  Psychiatric: She has a normal mood and affect. Her behavior is normal.          Assessment & Plan:   Diabetes mellitus. Well controlled. We'll check a hemoglobin A1c and urine for microalbumin Dyslipidemia. We'll continue simvastatin. We'll check a lipid profile Hypertension well controlled History of renal insufficiency. Will check electrolytes and renal indices Gout stable. We'll check a uric acid level

## 2013-08-01 ENCOUNTER — Other Ambulatory Visit: Payer: Self-pay | Admitting: Internal Medicine

## 2013-09-26 ENCOUNTER — Other Ambulatory Visit: Payer: Self-pay | Admitting: Internal Medicine

## 2013-10-12 ENCOUNTER — Ambulatory Visit (INDEPENDENT_AMBULATORY_CARE_PROVIDER_SITE_OTHER): Payer: Medicare Other | Admitting: Internal Medicine

## 2013-10-12 ENCOUNTER — Encounter: Payer: Self-pay | Admitting: Internal Medicine

## 2013-10-12 VITALS — BP 118/80 | HR 96 | Temp 97.7°F | Resp 20 | Wt 185.0 lb

## 2013-10-12 DIAGNOSIS — E119 Type 2 diabetes mellitus without complications: Secondary | ICD-10-CM

## 2013-10-12 DIAGNOSIS — E785 Hyperlipidemia, unspecified: Secondary | ICD-10-CM

## 2013-10-12 DIAGNOSIS — I10 Essential (primary) hypertension: Secondary | ICD-10-CM

## 2013-10-12 DIAGNOSIS — Z23 Encounter for immunization: Secondary | ICD-10-CM

## 2013-10-12 NOTE — Progress Notes (Signed)
Subjective:    Patient ID: Danielle Atkins, female    DOB: 1935-01-11, 78 y.o.   MRN: [id number]  HPI  78 year old patient who is seen today for followup of diabetes and hypertension. She is doing remarkable well. Her last hemoglobin A1c 6.4. Her diabetes has remained stable for years. She does have any annual eye examination performed. No concerns or complaints today.  Past Medical History  Diagnosis Date  . ANEMIA-NOS 06/25/2007  . DIABETES MELLITUS, TYPE II 06/25/2007  . GOUT 11/16/2009  . HYPERLIPIDEMIA 06/25/2007  . HYPERTENSION 06/25/2007  . PARESTHESIA, HANDS 03/27/2009  . PEPTIC ULCER DISEASE 06/25/2007  . RENAL INSUFFICIENCY 06/25/2007  . TRANSIENT ISCHEMIC ATTACK, HX OF 06/25/2007    History   Social History  . Marital Status: Widowed    Spouse Name: N/A    Number of Children: N/A  . Years of Education: N/A   Occupational History  . Not on file.   Social History Main Topics  . Smoking status: Former Smoker    Quit date: 12/16/1999  . Smokeless tobacco: Never Used  . Alcohol Use: No  . Drug Use: No  . Sexual Activity: Not on file   Other Topics Concern  . Not on file   Social History Narrative  . No narrative on file    History reviewed. No pertinent past surgical history.  No family history on file.  No Known Allergies  Current Outpatient Prescriptions on File Prior to Visit  Medication Sig Dispense Refill  . albuterol (PROAIR HFA) 108 (90 BASE) MCG/ACT inhaler Inhale 2 puffs into the lungs every 6 (six) hours as needed for wheezing.  18 g  2  . allopurinol (ZYLOPRIM) 300 MG tablet Take 1 tablet (300 mg total) by mouth daily.  90 tablet  1  . aspirin 325 MG tablet Take 325 mg by mouth daily.        . cimetidine (TAGAMET) 400 MG tablet TAKE 1 TABLET TWICE DAILY  180 tablet  1  . colchicine 0.6 MG tablet Take 1 tablet (0.6 mg total) by mouth daily as needed.  60 tablet  4  . glucose blood test strip 1 each by Other route daily as needed for other.  100 each  12   . lisinopril-hydrochlorothiazide (PRINZIDE,ZESTORETIC) 20-25 MG per tablet Take 1 tablet by mouth daily.  90 tablet  1  . metFORMIN (GLUCOPHAGE) 500 MG tablet TAKE 1 TABLET (500 MG TOTAL) BY MOUTH 2 (TWO) TIMES DAILY WITH A MEAL.  180 tablet  1  . Multiple Vitamin (MULTIVITAMIN WITH MINERALS) TABS Take 1 tablet by mouth daily.      . potassium chloride SA (K-DUR,KLOR-CON) 20 MEQ tablet Take 1 tablet (20 mEq total) by mouth daily.  90 tablet  1  . simvastatin (ZOCOR) 40 MG tablet Take 1 tablet (40 mg total) by mouth daily.  90 tablet  1   No current facility-administered medications on file prior to visit.    BP 118/80  Pulse 96  Temp(Src) 97.7 F (36.5 C) (Oral)  Resp 20  Wt 185 lb (83.915 kg)  BMI 31.74 kg/m2  SpO2 98%       Review of Systems  Constitutional: Negative.   HENT: Negative for congestion, dental problem, hearing loss, rhinorrhea, sinus pressure, sore throat and tinnitus.   Eyes: Negative for pain, discharge and visual disturbance.  Respiratory: Negative for cough and shortness of breath.   Cardiovascular: Negative for chest pain, palpitations and leg swelling.  Gastrointestinal: Negative for nausea,  vomiting, abdominal pain, diarrhea, constipation, blood in stool and abdominal distention.  Genitourinary: Negative for dysuria, urgency, frequency, hematuria, flank pain, vaginal bleeding, vaginal discharge, difficulty urinating, vaginal pain and pelvic pain.  Musculoskeletal: Negative for arthralgias, gait problem and joint swelling.  Skin: Negative for rash.  Neurological: Negative for dizziness, syncope, speech difficulty, weakness, numbness and headaches.  Hematological: Negative for adenopathy.  Psychiatric/Behavioral: Negative for behavioral problems, dysphoric mood and agitation. The patient is not nervous/anxious.        Objective:   Physical Exam  Constitutional: She is oriented to person, place, and time. She appears well-developed and well-nourished.   HENT:  Head: Normocephalic.  Right Ear: External ear normal.  Left Ear: External ear normal.  Mouth/Throat: Oropharynx is clear and moist.  Eyes: Conjunctivae and EOM are normal. Pupils are equal, round, and reactive to light.  Neck: Normal range of motion. Neck supple. No thyromegaly present.  Cardiovascular: Normal rate, regular rhythm, normal heart sounds and intact distal pulses.   Pulmonary/Chest: Effort normal and breath sounds normal.  Abdominal: Soft. Bowel sounds are normal. She exhibits no mass. There is no tenderness.  Musculoskeletal: Normal range of motion.  Lymphadenopathy:    She has no cervical adenopathy.  Neurological: She is alert and oriented to person, place, and time.  Skin: Skin is warm and dry. No rash noted.  Psychiatric: She has a normal mood and affect. Her behavior is normal.          Assessment & Plan:   Diabetes mellitus. Well controlled Hypertension stable History of gout stable Dyslipidemia. Continue simvastatin  Recheck 3 months

## 2013-10-12 NOTE — Patient Instructions (Signed)
Limit your sodium (Salt) intake    It is important that you exercise regularly, at least 20 minutes 3 to 4 times per week.  If you develop chest pain or shortness of breath seek  medical attention. 

## 2013-11-18 ENCOUNTER — Telehealth: Payer: Self-pay | Admitting: Internal Medicine

## 2013-11-18 MED ORDER — SIMVASTATIN 40 MG PO TABS: 40.0000 mg | ORAL_TABLET | Freq: Every day | ORAL | Status: DC

## 2013-11-18 MED ORDER — COLCHICINE 0.6 MG PO TABS: 0.6000 mg | ORAL_TABLET | Freq: Every day | ORAL | Status: DC | PRN

## 2013-11-18 MED ORDER — POTASSIUM CHLORIDE CRYS ER 20 MEQ PO TBCR: 20.0000 meq | EXTENDED_RELEASE_TABLET | Freq: Every day | ORAL | Status: DC

## 2013-11-18 MED ORDER — ALLOPURINOL 300 MG PO TABS: 300.0000 mg | ORAL_TABLET | Freq: Every day | ORAL | Status: DC

## 2013-11-18 MED ORDER — LISINOPRIL-HYDROCHLOROTHIAZIDE 20-25 MG PO TABS: 1.0000 | ORAL_TABLET | Freq: Every day | ORAL | Status: DC

## 2013-11-18 NOTE — Telephone Encounter (Signed)
Pt needs a refill of her simvastatin (ZOCOR) 40 MG tablet.  [facility] [place], telephone number is [phone]

## 2013-11-18 NOTE — Telephone Encounter (Signed)
Pt calling to state she is out of town and out of medication and would need medication refilled today.

## 2013-11-18 NOTE — Telephone Encounter (Signed)
Spoke to pt told her Rx refill sent to [place] pharmacy as requested and I filled other Rx's to local pharmacy. Pt verbalized understanding.

## 2013-12-30 ENCOUNTER — Encounter: Payer: Self-pay | Admitting: Internal Medicine

## 2013-12-30 ENCOUNTER — Ambulatory Visit (INDEPENDENT_AMBULATORY_CARE_PROVIDER_SITE_OTHER): Payer: Medicare Other | Admitting: Internal Medicine

## 2013-12-30 VITALS — BP 120/70 | HR 106 | Temp 97.8°F | Resp 20 | Ht 64.0 in | Wt 184.0 lb

## 2013-12-30 DIAGNOSIS — E119 Type 2 diabetes mellitus without complications: Secondary | ICD-10-CM

## 2013-12-30 DIAGNOSIS — B9789 Other viral agents as the cause of diseases classified elsewhere: Secondary | ICD-10-CM

## 2013-12-30 DIAGNOSIS — I10 Essential (primary) hypertension: Secondary | ICD-10-CM

## 2013-12-30 DIAGNOSIS — J069 Acute upper respiratory infection, unspecified: Secondary | ICD-10-CM

## 2013-12-30 DIAGNOSIS — E785 Hyperlipidemia, unspecified: Secondary | ICD-10-CM

## 2013-12-30 LAB — HEMOGLOBIN A1C: Hgb A1c MFr Bld: 6.7 % — ABNORMAL HIGH (ref 4.6–6.5)

## 2013-12-30 NOTE — Patient Instructions (Signed)
Acute bronchitis symptoms for less than 10 days are generally not helped by antibiotics.  Take over-the-counter expectorants and cough medications such as  Mucinex DM.  Call if there is no improvement in 5 to 7 days or if he developed worsening cough, fever, or new symptoms, such as shortness of breath or chest pain.  Limit your sodium (Salt) intake  It  is important that you exercise regularly, at least 20 minutes 3 to 4 times per week.  If you develop chest pain or shortness of breath seek  medical attention.

## 2013-12-30 NOTE — Progress Notes (Signed)
Subjective:    Patient ID: Danielle Atkins, female    DOB: 1935-07-01, 80 y.o.   MRN: [id number]  HPI  79 year old patient who has a history of type 2 diabetes which is well-controlled with metformin therapy. She has treated hypertension and dyslipidemia. For the past couple weeks she has had residual cough. In general feels well. No sputum production fever shortness of breath or wheezing. She has maintained much glycemic control. Last hemoglobin A1c in a nondiabetic range.  Past Medical History  Diagnosis Date  . ANEMIA-NOS 06/25/2007  . DIABETES MELLITUS, TYPE II 06/25/2007  . GOUT 11/16/2009  . HYPERLIPIDEMIA 06/25/2007  . HYPERTENSION 06/25/2007  . PARESTHESIA, HANDS 03/27/2009  . PEPTIC ULCER DISEASE 06/25/2007  . RENAL INSUFFICIENCY 06/25/2007  . TRANSIENT ISCHEMIC ATTACK, HX OF 06/25/2007    History   Social History  . Marital Status: Widowed    Spouse Name: N/A    Number of Children: N/A  . Years of Education: N/A   Occupational History  . Not on file.   Social History Main Topics  . Smoking status: Former Smoker    Quit date: 12/16/1999  . Smokeless tobacco: Never Used  . Alcohol Use: No  . Drug Use: No  . Sexual Activity: Not on file   Other Topics Concern  . Not on file   Social History Narrative  . No narrative on file    History reviewed. No pertinent past surgical history.  No family history on file.  No Known Allergies  Current Outpatient Prescriptions on File Prior to Visit  Medication Sig Dispense Refill  . albuterol (PROAIR HFA) 108 (90 BASE) MCG/ACT inhaler Inhale 2 puffs into the lungs every 6 (six) hours as needed for wheezing.  18 g  2  . allopurinol (ZYLOPRIM) 300 MG tablet Take 1 tablet (300 mg total) by mouth daily.  90 tablet  1  . aspirin 325 MG tablet Take 325 mg by mouth daily.        . cimetidine (TAGAMET) 400 MG tablet TAKE 1 TABLET TWICE DAILY  180 tablet  1  . colchicine 0.6 MG tablet Take 1 tablet (0.6 mg total) by mouth daily as  needed.  90 tablet  1  . glucose blood test strip 1 each by Other route daily as needed for other.  100 each  12  . lisinopril-hydrochlorothiazide (PRINZIDE,ZESTORETIC) 20-25 MG per tablet Take 1 tablet by mouth daily.  90 tablet  1  . metFORMIN (GLUCOPHAGE) 500 MG tablet TAKE 1 TABLET (500 MG TOTAL) BY MOUTH 2 (TWO) TIMES DAILY WITH A MEAL.  180 tablet  1  . Multiple Vitamin (MULTIVITAMIN WITH MINERALS) TABS Take 1 tablet by mouth daily.      . potassium chloride SA (K-DUR,KLOR-CON) 20 MEQ tablet Take 1 tablet (20 mEq total) by mouth daily.  90 tablet  1  . simvastatin (ZOCOR) 40 MG tablet Take 1 tablet (40 mg total) by mouth daily.  90 tablet  1   No current facility-administered medications on file prior to visit.    BP 120/70  Pulse 106  Temp(Src) 97.8 F (36.6 C) (Oral)  Resp 20  Ht 5\' 4"  (1.626 m)  Wt 184 lb (83.462 kg)  BMI 31.57 kg/m2  SpO2 95%       Review of Systems  Constitutional: Negative.   HENT: Negative for congestion, dental problem, hearing loss, rhinorrhea, sinus pressure, sore throat and tinnitus.   Eyes: Negative for pain, discharge and visual disturbance.  Respiratory:  Positive for cough. Negative for shortness of breath.   Cardiovascular: Negative for chest pain, palpitations and leg swelling.  Gastrointestinal: Negative for nausea, vomiting, abdominal pain, diarrhea, constipation, blood in stool and abdominal distention.  Genitourinary: Negative for dysuria, urgency, frequency, hematuria, flank pain, vaginal bleeding, vaginal discharge, difficulty urinating, vaginal pain and pelvic pain.  Musculoskeletal: Negative for arthralgias, gait problem and joint swelling.  Skin: Negative for rash.  Neurological: Negative for dizziness, syncope, speech difficulty, weakness, numbness and headaches.  Hematological: Negative for adenopathy.  Psychiatric/Behavioral: Negative for behavioral problems, dysphoric mood and agitation. The patient is not nervous/anxious.         Objective:   Physical Exam  Constitutional: She is oriented to person, place, and time. She appears well-developed and well-nourished.  HENT:  Head: Normocephalic.  Right Ear: External ear normal.  Left Ear: External ear normal.  Mouth/Throat: Oropharynx is clear and moist.  Eyes: Conjunctivae and EOM are normal. Pupils are equal, round, and reactive to light.  Neck: Normal range of motion. Neck supple. No thyromegaly present.  Cardiovascular: Normal rate, regular rhythm, normal heart sounds and intact distal pulses.   Pulmonary/Chest: Effort normal and breath sounds normal.  Abdominal: Soft. Bowel sounds are normal. She exhibits no mass. There is no tenderness.  Musculoskeletal: Normal range of motion.  Lymphadenopathy:    She has no cervical adenopathy.  Neurological: She is alert and oriented to person, place, and time.  Skin: Skin is warm and dry. No rash noted.  Psychiatric: She has a normal mood and affect. Her behavior is normal.          Assessment & Plan:   Viral URI with cough. Will she symptomatically Diabetes mellitus. Appears to be under excellent control. Check a hemoglobin A1c Hypertension well controlled  dyslipidemia continue statin therapy  Recheck 6 months

## 2013-12-30 NOTE — Progress Notes (Signed)
Pre-visit discussion using our clinic review tool. No additional management support is needed unless otherwise documented below in the visit note.  

## 2014-01-02 ENCOUNTER — Telehealth: Payer: Self-pay

## 2014-01-02 NOTE — Telephone Encounter (Signed)
Relevant patient education mailed to patient.  

## 2014-01-12 ENCOUNTER — Ambulatory Visit: Payer: Medicare Other | Admitting: Internal Medicine

## 2014-01-18 ENCOUNTER — Telehealth: Payer: Self-pay | Admitting: Internal Medicine

## 2014-01-18 NOTE — Telephone Encounter (Signed)
Relevant patient education mailed to patient.  

## 2014-02-10 ENCOUNTER — Telehealth: Payer: Self-pay | Admitting: Internal Medicine

## 2014-02-10 MED ORDER — METFORMIN HCL 500 MG PO TABS: ORAL_TABLET | ORAL | Status: DC

## 2014-02-10 NOTE — Telephone Encounter (Signed)
Pt is staying in [place] at present and needs refill metFORMIN (GLUCOPHAGE) 500 MG tablet 90 days (180 tabs) [phone].  [place], [place] [phone]

## 2014-03-29 ENCOUNTER — Other Ambulatory Visit: Payer: Self-pay | Admitting: Internal Medicine

## 2014-05-19 ENCOUNTER — Other Ambulatory Visit: Payer: Self-pay | Admitting: Internal Medicine

## 2014-05-19 DIAGNOSIS — Z1231 Encounter for screening mammogram for malignant neoplasm of breast: Secondary | ICD-10-CM

## 2014-06-29 ENCOUNTER — Encounter: Payer: Self-pay | Admitting: Internal Medicine

## 2014-06-29 ENCOUNTER — Ambulatory Visit (INDEPENDENT_AMBULATORY_CARE_PROVIDER_SITE_OTHER): Payer: Medicare Other | Admitting: Internal Medicine

## 2014-06-29 VITALS — BP 120/78 | HR 101 | Temp 98.1°F | Resp 20 | Ht 64.0 in | Wt 186.0 lb

## 2014-06-29 DIAGNOSIS — E119 Type 2 diabetes mellitus without complications: Secondary | ICD-10-CM

## 2014-06-29 DIAGNOSIS — I10 Essential (primary) hypertension: Secondary | ICD-10-CM

## 2014-06-29 DIAGNOSIS — N259 Disorder resulting from impaired renal tubular function, unspecified: Secondary | ICD-10-CM

## 2014-06-29 DIAGNOSIS — E785 Hyperlipidemia, unspecified: Secondary | ICD-10-CM

## 2014-06-29 DIAGNOSIS — D649 Anemia, unspecified: Secondary | ICD-10-CM

## 2014-06-29 LAB — COMPREHENSIVE METABOLIC PANEL
ALBUMIN: 4 g/dL (ref 3.5–5.2)
ALK PHOS: 52 U/L (ref 39–117)
ALT: 25 U/L (ref 0–35)
AST: 32 U/L (ref 0–37)
BUN: 20 mg/dL (ref 6–23)
CO2: 26 mEq/L (ref 19–32)
Calcium: 9.7 mg/dL (ref 8.4–10.5)
Chloride: 106 mEq/L (ref 96–112)
Creatinine, Ser: 1.6 mg/dL — ABNORMAL HIGH (ref 0.4–1.2)
GFR: 39.31 mL/min — ABNORMAL LOW (ref >60.00)
Glucose, Bld: 118 mg/dL — ABNORMAL HIGH (ref 70–99)
POTASSIUM: 4.5 meq/L (ref 3.5–5.1)
Sodium: 138 mEq/L (ref 135–145)
Total Bilirubin: 0.5 mg/dL (ref 0.2–1.2)
Total Protein: 7.7 g/dL (ref 6.0–8.3)

## 2014-06-29 LAB — CBC WITH DIFFERENTIAL/PLATELET
BASOS PCT: 0.5 % (ref 0.0–3.0)
Basophils Absolute: 0 10*3/uL (ref 0.0–0.1)
EOS PCT: 6.9 % — AB (ref 0.0–5.0)
Eosinophils Absolute: 0.5 10*3/uL (ref 0.0–0.7)
HEMATOCRIT: 36.1 % (ref 36.0–46.0)
Hemoglobin: 11.1 g/dL — ABNORMAL LOW (ref 12.0–15.0)
LYMPHS ABS: 2.4 10*3/uL (ref 0.7–4.0)
Lymphocytes Relative: 31 % (ref 12.0–46.0)
MCHC: 30.7 g/dL (ref 30.0–36.0)
MCV: 70.8 fl — AB (ref 78.0–100.0)
MONO ABS: 0.6 10*3/uL (ref 0.1–1.0)
Monocytes Relative: 8.3 % (ref 3.0–12.0)
Neutro Abs: 4 10*3/uL (ref 1.4–7.7)
Neutrophils Relative %: 53.3 % (ref 43.0–77.0)
PLATELETS: 259 10*3/uL (ref 150.0–400.0)
RBC: 5.1 Mil/uL (ref 3.87–5.11)
RDW: 18.7 % — ABNORMAL HIGH (ref 11.5–15.5)
WBC: 7.6 10*3/uL (ref 4.0–10.5)

## 2014-06-29 LAB — MICROALBUMIN / CREATININE URINE RATIO
CREATININE, U: 352.3 mg/dL
MICROALB/CREAT RATIO: 1.2 mg/g (ref 0.0–30.0)
Microalb, Ur: 4.2 mg/dL — ABNORMAL HIGH (ref 0.0–1.9)

## 2014-06-29 LAB — HEMOGLOBIN A1C: Hgb A1c MFr Bld: 6.9 % — ABNORMAL HIGH (ref 4.6–6.5)

## 2014-06-29 LAB — TSH: TSH: 2.88 u[IU]/mL (ref 0.35–4.50)

## 2014-06-29 MED ORDER — ASPIRIN 81 MG PO TABS: 81.0000 mg | ORAL_TABLET | Freq: Every day | ORAL | Status: DC

## 2014-06-29 MED ORDER — LISINOPRIL 10 MG PO TABS: 10.0000 mg | ORAL_TABLET | Freq: Every day | ORAL | Status: DC

## 2014-06-29 NOTE — Progress Notes (Signed)
Subjective:    Patient ID: Danielle Atkins, female    DOB: 12-18-1934, 79 y.o.   MRN: [id number]  HPI 79 year old patient who is seen today for followup.  She is seen biannually with a history of diabetes, hypertension, renal insufficiency, as well as dyslipidemia.  Hemoglobin A1c's have been well controlled.  Serum creatinine noted to be elevated at 2 point 5 in 11 months ago. The patient feels well remains active and has no complaints Denies any cardiopulmonary complaints  The patient has a annual eye examination.  She is scheduled for a mammogram next week Past Medical History  Diagnosis Date  . ANEMIA-NOS 06/25/2007  . DIABETES MELLITUS, TYPE II 06/25/2007  . GOUT 11/16/2009  . HYPERLIPIDEMIA 06/25/2007  . HYPERTENSION 06/25/2007  . PARESTHESIA, HANDS 03/27/2009  . PEPTIC ULCER DISEASE 06/25/2007  . RENAL INSUFFICIENCY 06/25/2007  . TRANSIENT ISCHEMIC ATTACK, HX OF 06/25/2007    History   Social History  . Marital Status: Widowed    Spouse Name: N/A    Number of Children: N/A  . Years of Education: N/A   Occupational History  . Not on file.   Social History Main Topics  . Smoking status: Former Smoker    Quit date: 12/16/1999  . Smokeless tobacco: Never Used  . Alcohol Use: No  . Drug Use: No  . Sexual Activity: Not on file   Other Topics Concern  . Not on file   Social History Narrative  . No narrative on file    No past surgical history on file.  No family history on file.  No Known Allergies  Current Outpatient Prescriptions on File Prior to Visit  Medication Sig Dispense Refill  . albuterol (PROAIR HFA) 108 (90 BASE) MCG/ACT inhaler Inhale 2 puffs into the lungs every 6 (six) hours as needed for wheezing.  18 g  2  . allopurinol (ZYLOPRIM) 300 MG tablet Take 1 tablet (300 mg total) by mouth daily.  90 tablet  1  . aspirin 325 MG tablet Take 325 mg by mouth daily.        . cimetidine (TAGAMET) 400 MG tablet TAKE 1 TABLET BY MOUTH TWICE A DAY  180 tablet  1  .  colchicine 0.6 MG tablet Take 1 tablet (0.6 mg total) by mouth daily as needed.  90 tablet  1  . glucose blood test strip 1 each by Other route daily as needed for other.  100 each  12  . lisinopril-hydrochlorothiazide (PRINZIDE,ZESTORETIC) 20-25 MG per tablet Take 1 tablet by mouth daily.  90 tablet  1  . metFORMIN (GLUCOPHAGE) 500 MG tablet TAKE 1 TABLET (500 MG TOTAL) BY MOUTH 2 (TWO) TIMES DAILY WITH A MEAL.  180 tablet  1  . Multiple Vitamin (MULTIVITAMIN WITH MINERALS) TABS Take 1 tablet by mouth daily.      . potassium chloride SA (K-DUR,KLOR-CON) 20 MEQ tablet Take 1 tablet (20 mEq total) by mouth daily.  90 tablet  1  . simvastatin (ZOCOR) 40 MG tablet Take 1 tablet (40 mg total) by mouth daily.  90 tablet  1   No current facility-administered medications on file prior to visit.    BP 120/78  Pulse 101  Temp(Src) 98.1 F (36.7 C) (Oral)  Resp 20  Ht 5\' 4"  (1.626 m)  Wt 186 lb (84.369 kg)  BMI 31.91 kg/m2  SpO2 98%    Review of Systems  Constitutional: Negative.   HENT: Negative for congestion, dental problem, hearing loss, rhinorrhea, sinus  pressure, sore throat and tinnitus.   Eyes: Negative for pain, discharge and visual disturbance.  Respiratory: Negative for cough and shortness of breath.   Cardiovascular: Negative for chest pain, palpitations and leg swelling.  Gastrointestinal: Negative for nausea, vomiting, abdominal pain, diarrhea, constipation, blood in stool and abdominal distention.  Genitourinary: Negative for dysuria, urgency, frequency, hematuria, flank pain, vaginal bleeding, vaginal discharge, difficulty urinating, vaginal pain and pelvic pain.  Musculoskeletal: Negative for arthralgias, gait problem and joint swelling.  Skin: Negative for rash.  Neurological: Negative for dizziness, syncope, speech difficulty, weakness, numbness and headaches.  Hematological: Negative for adenopathy.  Psychiatric/Behavioral: Negative for behavioral problems, dysphoric mood  and agitation. The patient is not nervous/anxious.        Objective:   Physical Exam  Constitutional: She is oriented to person, place, and time. She appears well-developed and well-nourished.  HENT:  Head: Normocephalic.  Right Ear: External ear normal.  Left Ear: External ear normal.  Mouth/Throat: Oropharynx is clear and moist.  Eyes: Conjunctivae and EOM are normal. Pupils are equal, round, and reactive to light.  Neck: Normal range of motion. Neck supple. No thyromegaly present.  Cardiovascular: Normal rate, regular rhythm, normal heart sounds and intact distal pulses.   Pulmonary/Chest: Effort normal and breath sounds normal.  Abdominal: Soft. Bowel sounds are normal. She exhibits no mass. There is no tenderness.  Musculoskeletal: Normal range of motion.  Lymphadenopathy:    She has no cervical adenopathy.  Neurological: She is alert and oriented to person, place, and time.  Skin: Skin is warm and dry. No rash noted.  Psychiatric: She has a normal mood and affect. Her behavior is normal.          Assessment & Plan:   Diabetes mellitus.  We'll check a hemoglobin A1c glycemic control appears to be excellent Chronic kidney disease.  Creatinine was noted to be 2 point 5, 11 months ago.  We'll repeat today and hold metformin therapy.  We'll discontinue diuretic therapy potassium supplementation and decrease lisinopril.  We'll also decrease daily, aspirin to 81 mg daily Hypertension well controlled Dyslipidemia

## 2014-06-29 NOTE — Progress Notes (Signed)
Pre visit review using our clinic review tool, if applicable. No additional management support is needed unless otherwise documented below in the visit note. 

## 2014-06-29 NOTE — Patient Instructions (Signed)
Decrease aspirin to 81 mg daily Hold metformin therapy Discontinue lisinopril- hydrochlorothiazide, combination Lisinopril 10 mg daily Discontinue potassium Discontinue colchicine  Return in 3 months for followup  Limit your sodium (Salt) intake   Please check your hemoglobin A1c every 3 months

## 2014-07-04 ENCOUNTER — Other Ambulatory Visit: Payer: Self-pay | Admitting: Internal Medicine

## 2014-07-07 ENCOUNTER — Ambulatory Visit (HOSPITAL_COMMUNITY)
Admission: RE | Admit: 2014-07-07 | Discharge: 2014-07-07 | Disposition: A | Discharge status: Home / Self Care | Payer: Medicare Other | Source: Ambulatory Visit | Attending: Internal Medicine | Admitting: Internal Medicine

## 2014-07-07 DIAGNOSIS — Z1231 Encounter for screening mammogram for malignant neoplasm of breast: Secondary | ICD-10-CM | POA: Diagnosis not present

## 2014-08-14 ENCOUNTER — Other Ambulatory Visit: Payer: Self-pay | Admitting: Internal Medicine

## 2014-09-09 ENCOUNTER — Other Ambulatory Visit: Payer: Self-pay | Admitting: Internal Medicine

## 2014-09-25 ENCOUNTER — Other Ambulatory Visit: Payer: Self-pay | Admitting: Internal Medicine

## 2014-10-03 ENCOUNTER — Ambulatory Visit (INDEPENDENT_AMBULATORY_CARE_PROVIDER_SITE_OTHER): Payer: Medicare Other | Admitting: Internal Medicine

## 2014-10-03 ENCOUNTER — Encounter: Payer: Self-pay | Admitting: Internal Medicine

## 2014-10-03 VITALS — BP 120/84 | HR 99 | Temp 97.8°F | Resp 20 | Ht 64.0 in | Wt 187.0 lb

## 2014-10-03 DIAGNOSIS — I10 Essential (primary) hypertension: Secondary | ICD-10-CM

## 2014-10-03 DIAGNOSIS — N259 Disorder resulting from impaired renal tubular function, unspecified: Secondary | ICD-10-CM

## 2014-10-03 DIAGNOSIS — E139 Other specified diabetes mellitus without complications: Secondary | ICD-10-CM

## 2014-10-03 DIAGNOSIS — Z23 Encounter for immunization: Secondary | ICD-10-CM

## 2014-10-03 DIAGNOSIS — E7800 Pure hypercholesterolemia, unspecified: Secondary | ICD-10-CM

## 2014-10-03 LAB — HEMOGLOBIN A1C: Hgb A1c MFr Bld: 6.6 % — ABNORMAL HIGH (ref 4.6–6.5)

## 2014-10-03 NOTE — Progress Notes (Signed)
Subjective:    Patient ID: Danielle Atkins, female    DOB: [birth year], 79 y.o.   MRN: [id number]  HPI 79 year old patient, who is seen today in followup.  She has a history of type 2 diabetes, as well as renal insufficiency.  3 months ago her metformin therapy was decreased due to mild renal insufficiency.  She has treated hypertension.  She remains on statin therapy, which he tolerates well.  No cardiopulmonary complaints.  She is low frustrated about lack of weight loss.  She does walk daily and to space in the [facility] program at the [facility]  Past Medical History  Diagnosis Date  . ANEMIA-NOS 06/25/2007  . DIABETES MELLITUS, TYPE II 06/25/2007  . GOUT 11/16/2009  . HYPERLIPIDEMIA 06/25/2007  . HYPERTENSION 06/25/2007  . PARESTHESIA, HANDS 03/27/2009  . PEPTIC ULCER DISEASE 06/25/2007  . RENAL INSUFFICIENCY 06/25/2007  . TRANSIENT ISCHEMIC ATTACK, HX OF 06/25/2007    History   Social History  . Marital Status: Widowed    Spouse Name: N/A    Number of Children: N/A  . Years of Education: N/A   Occupational History  . Not on file.   Social History Main Topics  . Smoking status: Former Smoker    Quit date: 12/16/1999  . Smokeless tobacco: Never Used  . Alcohol Use: No  . Drug Use: No  . Sexual Activity: Not on file   Other Topics Concern  . Not on file   Social History Narrative  . No narrative on file    History reviewed. No pertinent past surgical history.  No family history on file.  No Known Allergies  Current Outpatient Prescriptions on File Prior to Visit  Medication Sig Dispense Refill  . albuterol (PROAIR HFA) 108 (90 BASE) MCG/ACT inhaler Inhale 2 puffs into the lungs every 6 (six) hours as needed for wheezing.  18 g  2  . allopurinol (ZYLOPRIM) 300 MG tablet TAKE 1 TABLET EVERY DAY  90 tablet  1  . aspirin 81 MG tablet Take 1 tablet (81 mg total) by mouth daily.  30 tablet    . cimetidine (TAGAMET) 400 MG tablet TAKE 1 TABLET BY MOUTH TWICE A DAY  180  tablet  1  . glucose blood test strip 1 each by Other route daily as needed for other.  100 each  12  . lisinopril (PRINIVIL,ZESTRIL) 10 MG tablet Take 1 tablet (10 mg total) by mouth daily.  90 tablet  3  . metFORMIN (GLUCOPHAGE) 500 MG tablet TAKE 1 TABLET (500 MG TOTAL) BY MOUTH 2 (TWO) TIMES DAILY WITH A MEAL.  180 tablet  1  . Multiple Vitamin (MULTIVITAMIN WITH MINERALS) TABS Take 1 tablet by mouth daily.      . simvastatin (ZOCOR) 40 MG tablet TAKE 1 TABLET (40 MG TOTAL) BY MOUTH DAILY.  90 tablet  1   No current facility-administered medications on file prior to visit.    BP 120/84  Pulse 99  Temp(Src) 97.8 F (36.6 C) (Oral)  Resp 20  Ht 5\' 4"  (1.626 m)  Wt 187 lb (84.823 kg)  BMI 32.08 kg/m2  SpO2 98%       Review of Systems  Constitutional: Negative.   HENT: Negative for congestion, dental problem, hearing loss, rhinorrhea, sinus pressure, sore throat and tinnitus.   Eyes: Negative for pain, discharge and visual disturbance.  Respiratory: Negative for cough and shortness of breath.   Cardiovascular: Negative for chest pain, palpitations and leg swelling.  Gastrointestinal:  Negative for nausea, vomiting, abdominal pain, diarrhea, constipation, blood in stool and abdominal distention.  Genitourinary: Negative for dysuria, urgency, frequency, hematuria, flank pain, vaginal bleeding, vaginal discharge, difficulty urinating, vaginal pain and pelvic pain.  Musculoskeletal: Negative for arthralgias, gait problem and joint swelling.  Skin: Negative for rash.  Neurological: Negative for dizziness, syncope, speech difficulty, weakness, numbness and headaches.  Hematological: Negative for adenopathy.  Psychiatric/Behavioral: Negative for behavioral problems, dysphoric mood and agitation. The patient is not nervous/anxious.        Objective:   Physical Exam  Constitutional: She is oriented to person, place, and time. She appears well-developed and well-nourished.  HENT:    Head: Normocephalic.  Right Ear: External ear normal.  Left Ear: External ear normal.  Mouth/Throat: Oropharynx is clear and moist.  Eyes: Conjunctivae and EOM are normal. Pupils are equal, round, and reactive to light.  Neck: Normal range of motion. Neck supple. No thyromegaly present.  Cardiovascular: Normal rate, regular rhythm, normal heart sounds and intact distal pulses.   Pulmonary/Chest: Effort normal and breath sounds normal.  Abdominal: Soft. Bowel sounds are normal. She exhibits no mass. There is no tenderness.  Musculoskeletal: Normal range of motion.  Lymphadenopathy:    She has no cervical adenopathy.  Neurological: She is alert and oriented to person, place, and time.  Skin: Skin is warm and dry. No rash noted.  Psychiatric: She has a normal mood and affect. Her behavior is normal.          Assessment & Plan:   Diabetes mellitus.  We'll check hemoglobin A1c Hypertension well controlled Dyslipidemia Chronic kidney disease  No change in medication Recheck 3-6 months

## 2014-10-03 NOTE — Patient Instructions (Signed)
It is important that you exercise regularly, at least 20 minutes 3 to 4 times per week.  If you develop chest pain or shortness of breath seek  medical attention.  Return in 6 months for follow-up  

## 2014-10-03 NOTE — Progress Notes (Signed)
Pre visit review using our clinic review tool, if applicable. No additional management support is needed unless otherwise documented below in the visit note. 

## 2014-10-03 NOTE — Progress Notes (Signed)
   Subjective:    Patient ID: Danielle Atkins, female    DOB: 05-10-1935, 79 y.o.   MRN: [id number]  HPI  Wt Readings from Last 3 Encounters:  10/03/14 187 lb (84.823 kg)  06/29/14 186 lb (84.369 kg)  12/30/13 184 lb (83.462 kg)    Review of Systems     Objective:   Physical Exam        Assessment & Plan:

## 2014-12-26 ENCOUNTER — Other Ambulatory Visit: Payer: Self-pay | Admitting: Internal Medicine

## 2015-01-19 DIAGNOSIS — E119 Type 2 diabetes mellitus without complications: Secondary | ICD-10-CM | POA: Diagnosis not present

## 2015-01-19 DIAGNOSIS — H02825 Cysts of left lower eyelid: Secondary | ICD-10-CM | POA: Diagnosis not present

## 2015-01-19 DIAGNOSIS — Z961 Presence of intraocular lens: Secondary | ICD-10-CM | POA: Diagnosis not present

## 2015-02-07 ENCOUNTER — Other Ambulatory Visit: Payer: Self-pay | Admitting: Internal Medicine

## 2015-03-19 ENCOUNTER — Other Ambulatory Visit: Payer: Self-pay | Admitting: Internal Medicine

## 2015-03-22 ENCOUNTER — Other Ambulatory Visit: Payer: Self-pay | Admitting: Internal Medicine

## 2015-03-22 DIAGNOSIS — Z1231 Encounter for screening mammogram for malignant neoplasm of breast: Secondary | ICD-10-CM

## 2015-04-02 ENCOUNTER — Ambulatory Visit: Payer: Medicare Other | Admitting: Internal Medicine

## 2015-04-20 ENCOUNTER — Ambulatory Visit: Payer: Self-pay | Admitting: Internal Medicine

## 2015-04-25 ENCOUNTER — Ambulatory Visit: Payer: Self-pay | Admitting: Internal Medicine

## 2015-05-21 ENCOUNTER — Encounter: Payer: Self-pay | Admitting: Internal Medicine

## 2015-05-21 ENCOUNTER — Ambulatory Visit (INDEPENDENT_AMBULATORY_CARE_PROVIDER_SITE_OTHER): Payer: Medicare Other | Admitting: Internal Medicine

## 2015-05-21 ENCOUNTER — Other Ambulatory Visit: Payer: Self-pay | Admitting: *Deleted

## 2015-05-21 VITALS — BP 130/80 | HR 88 | Temp 98.1°F | Resp 20 | Ht 64.0 in | Wt 190.0 lb

## 2015-05-21 DIAGNOSIS — E0821 Diabetes mellitus due to underlying condition with diabetic nephropathy: Secondary | ICD-10-CM

## 2015-05-21 DIAGNOSIS — I10 Essential (primary) hypertension: Secondary | ICD-10-CM | POA: Diagnosis not present

## 2015-05-21 DIAGNOSIS — E1129 Type 2 diabetes mellitus with other diabetic kidney complication: Secondary | ICD-10-CM | POA: Insufficient documentation

## 2015-05-21 DIAGNOSIS — E7800 Pure hypercholesterolemia, unspecified: Secondary | ICD-10-CM

## 2015-05-21 DIAGNOSIS — E139 Other specified diabetes mellitus without complications: Secondary | ICD-10-CM | POA: Diagnosis not present

## 2015-05-21 HISTORY — DX: Type 2 diabetes mellitus with other diabetic kidney complication: E11.29

## 2015-05-21 LAB — BASIC METABOLIC PANEL
BUN: 13 mg/dL (ref 6–23)
CO2: 26 meq/L (ref 19–32)
CREATININE: 1.77 mg/dL — AB (ref 0.40–1.20)
Calcium: 9.4 mg/dL (ref 8.4–10.5)
Chloride: 106 mEq/L (ref 96–112)
GFR: 35.66 mL/min — AB (ref >60.00)
GLUCOSE: 122 mg/dL — AB (ref 70–99)
POTASSIUM: 4.2 meq/L (ref 3.5–5.1)
SODIUM: 137 meq/L (ref 135–145)

## 2015-05-21 LAB — HEMOGLOBIN A1C: HEMOGLOBIN A1C: 6.6 % — AB (ref 4.6–6.5)

## 2015-05-21 MED ORDER — ALLOPURINOL 300 MG PO TABS: 300.0000 mg | ORAL_TABLET | Freq: Every day | ORAL | Status: DC

## 2015-05-21 MED ORDER — SIMVASTATIN 40 MG PO TABS: ORAL_TABLET | ORAL | Status: DC

## 2015-05-21 MED ORDER — ALBUTEROL SULFATE HFA 108 (90 BASE) MCG/ACT IN AERS: 2.0000 | INHALATION_SPRAY | Freq: Four times a day (QID) | RESPIRATORY_TRACT | Status: DC | PRN

## 2015-05-21 MED ORDER — CIMETIDINE 400 MG PO TABS: 400.0000 mg | ORAL_TABLET | Freq: Two times a day (BID) | ORAL | Status: DC

## 2015-05-21 MED ORDER — LISINOPRIL 10 MG PO TABS: 10.0000 mg | ORAL_TABLET | Freq: Every day | ORAL | Status: DC

## 2015-05-21 MED ORDER — METFORMIN HCL 500 MG PO TABS: ORAL_TABLET | ORAL | Status: DC

## 2015-05-21 NOTE — Patient Instructions (Signed)
Annual eye examination in December  Limit your sodium (Salt) intake    It is important that you exercise regularly, at least 20 minutes 3 to 4 times per week.  If you develop chest pain or shortness of breath seek  medical attention.  Return in 4 months for follow-up

## 2015-05-21 NOTE — Progress Notes (Signed)
Subjective:    Patient ID: Danielle Atkins, female    DOB: Sep 15, 1935, 80 y.o.   MRN: [id number]  HPI  80 year old patient who is seen today for evaluation of type 2 diabetes.  She has renal insufficiency.  She has hypertension and remains on statin therapy.  No concerns or complaints.  She is quite active with travel.  Denies any cardiopulmonary complaints.  Lab Results  Component Value Date   HGBA1C 6.6* 10/03/2014    She is scheduled for her annual eye examination in December  Past Medical History  Diagnosis Date  . ANEMIA-NOS 06/25/2007  . GOUT 11/16/2009  . PARESTHESIA, HANDS 03/27/2009  . PEPTIC ULCER DISEASE 06/25/2007  . RENAL INSUFFICIENCY 06/25/2007  . TRANSIENT ISCHEMIC ATTACK, HX OF 06/25/2007    History   Social History  . Marital Status: Widowed    Spouse Name: N/A  . Number of Children: N/A  . Years of Education: N/A   Occupational History  . Not on file.   Social History Main Topics  . Smoking status: Former Smoker    Quit date: 12/16/1999  . Smokeless tobacco: Never Used  . Alcohol Use: No  . Drug Use: No  . Sexual Activity: Not on file   Other Topics Concern  . Not on file   Social History Narrative    No past surgical history on file.  No family history on file.  No Known Allergies  Current Outpatient Prescriptions on File Prior to Visit  Medication Sig Dispense Refill  . aspirin 81 MG tablet Take 1 tablet (81 mg total) by mouth daily. 30 tablet   . glucose blood test strip 1 each by Other route daily as needed for other. 100 each 12  . Multiple Vitamin (MULTIVITAMIN WITH MINERALS) TABS Take 1 tablet by mouth daily.     No current facility-administered medications on file prior to visit.    BP 130/80 mmHg  Pulse 88  Temp(Src) 98.1 F (36.7 C) (Oral)  Resp 20  Ht 5\' 4"  (1.626 m)  Wt 190 lb (86.183 kg)  BMI 32.60 kg/m2  SpO2 96%     Review of Systems  Constitutional: Negative.   HENT: Negative for congestion, dental problem,  hearing loss, rhinorrhea, sinus pressure, sore throat and tinnitus.   Eyes: Negative for pain, discharge and visual disturbance.  Respiratory: Negative for cough and shortness of breath.   Cardiovascular: Negative for chest pain, palpitations and leg swelling.  Gastrointestinal: Negative for nausea, vomiting, abdominal pain, diarrhea, constipation, blood in stool and abdominal distention.  Genitourinary: Negative for dysuria, urgency, frequency, hematuria, flank pain, vaginal bleeding, vaginal discharge, difficulty urinating, vaginal pain and pelvic pain.  Musculoskeletal: Negative for joint swelling, arthralgias and gait problem.  Skin: Negative for rash.  Neurological: Negative for dizziness, syncope, speech difficulty, weakness, numbness and headaches.  Hematological: Negative for adenopathy.  Psychiatric/Behavioral: Negative for behavioral problems, dysphoric mood and agitation. The patient is not nervous/anxious.        Objective:   Physical Exam  Constitutional: She is oriented to person, place, and time. She appears well-developed and well-nourished.  HENT:  Head: Normocephalic.  Right Ear: External ear normal.  Left Ear: External ear normal.  Mouth/Throat: Oropharynx is clear and moist.  Eyes: Conjunctivae and EOM are normal. Pupils are equal, round, and reactive to light.  Neck: Normal range of motion. Neck supple. No thyromegaly present.  Cardiovascular: Normal rate, regular rhythm, normal heart sounds and intact distal pulses.   Pulmonary/Chest: Effort normal  and breath sounds normal.  Abdominal: Soft. Bowel sounds are normal. She exhibits no mass. There is no tenderness.  Musculoskeletal: Normal range of motion.  Lymphadenopathy:    She has no cervical adenopathy.  Neurological: She is alert and oriented to person, place, and time.  Skin: Skin is warm and dry. No rash noted. mellitus Psychiatric: She has a normal mood and affect. Her behavior is normal.            Assessment & Plan:     Diabetes mellitus.  Will check a hemoglobin A1c Chronic kidney disease.  Will check renal indices.  If creatinine clearance is less than 30.  Will need to discontinue metformin therapy Hypertension, stable Dyslipidemia.  Continue statin therapy  CPX 4 months

## 2015-05-21 NOTE — Progress Notes (Signed)
Pre visit review using our clinic review tool, if applicable. No additional management support is needed unless otherwise documented below in the visit note. 

## 2015-07-09 ENCOUNTER — Ambulatory Visit (HOSPITAL_COMMUNITY)
Admission: RE | Admit: 2015-07-09 | Discharge: 2015-07-09 | Disposition: A | Discharge status: Home / Self Care | Payer: Medicare Other | Source: Ambulatory Visit | Attending: Internal Medicine | Admitting: Internal Medicine

## 2015-07-09 DIAGNOSIS — Z1231 Encounter for screening mammogram for malignant neoplasm of breast: Secondary | ICD-10-CM

## 2015-09-13 ENCOUNTER — Other Ambulatory Visit (INDEPENDENT_AMBULATORY_CARE_PROVIDER_SITE_OTHER): Payer: Medicare Other

## 2015-09-13 DIAGNOSIS — I10 Essential (primary) hypertension: Secondary | ICD-10-CM

## 2015-09-13 DIAGNOSIS — Z0000 Encounter for general adult medical examination without abnormal findings: Secondary | ICD-10-CM | POA: Diagnosis not present

## 2015-09-13 LAB — POCT URINALYSIS DIPSTICK
Bilirubin, UA: NEGATIVE
GLUCOSE UA: NEGATIVE
Leukocytes, UA: NEGATIVE
Nitrite, UA: NEGATIVE
Spec Grav, UA: >=1.03
UROBILINOGEN UA: 0.2
pH, UA: 5.5

## 2015-09-13 LAB — LIPID PANEL
CHOL/HDL RATIO: 3
Cholesterol: 138 mg/dL (ref 0–200)
HDL: 49.3 mg/dL (ref >39.00)
LDL CALC: 67 mg/dL (ref 0–99)
NonHDL: 88.62
TRIGLYCERIDES: 108 mg/dL (ref 0.0–149.0)
VLDL: 21.6 mg/dL (ref 0.0–40.0)

## 2015-09-13 LAB — CBC WITH DIFFERENTIAL/PLATELET
Basophils Absolute: 0.1 10*3/uL (ref 0.0–0.1)
Basophils Relative: 0.7 % (ref 0.0–3.0)
EOS ABS: 0.4 10*3/uL (ref 0.0–0.7)
EOS PCT: 4.8 % (ref 0.0–5.0)
HEMATOCRIT: 37.3 % (ref 36.0–46.0)
HEMOGLOBIN: 11.4 g/dL — AB (ref 12.0–15.0)
LYMPHS PCT: 42.4 % (ref 12.0–46.0)
Lymphs Abs: 3.5 10*3/uL (ref 0.7–4.0)
MCHC: 30.6 g/dL (ref 30.0–36.0)
MCV: 69.7 fl — ABNORMAL LOW (ref 78.0–100.0)
Monocytes Absolute: 0.5 10*3/uL (ref 0.1–1.0)
Monocytes Relative: 6.4 % (ref 3.0–12.0)
Neutro Abs: 3.8 10*3/uL (ref 1.4–7.7)
Neutrophils Relative %: 45.7 % (ref 43.0–77.0)
Platelets: 268 10*3/uL (ref 150.0–400.0)
RBC: 5.36 Mil/uL — AB (ref 3.87–5.11)
RDW: 18.6 % — AB (ref 11.5–15.5)
WBC: 8.4 10*3/uL (ref 4.0–10.5)

## 2015-09-13 LAB — TSH: TSH: 4.29 u[IU]/mL (ref 0.35–4.50)

## 2015-09-13 LAB — HEPATIC FUNCTION PANEL
ALBUMIN: 3.8 g/dL (ref 3.5–5.2)
ALT: 12 U/L (ref 0–35)
AST: 17 U/L (ref 0–37)
Alkaline Phosphatase: 56 U/L (ref 39–117)
Bilirubin, Direct: 0.2 mg/dL (ref 0.0–0.3)
TOTAL PROTEIN: 7.2 g/dL (ref 6.0–8.3)
Total Bilirubin: 0.7 mg/dL (ref 0.2–1.2)

## 2015-09-13 LAB — BASIC METABOLIC PANEL
BUN: 17 mg/dL (ref 6–23)
CO2: 26 mEq/L (ref 19–32)
Calcium: 9.6 mg/dL (ref 8.4–10.5)
Chloride: 107 mEq/L (ref 96–112)
Creatinine, Ser: 1.71 mg/dL — ABNORMAL HIGH (ref 0.40–1.20)
GFR: 37.08 mL/min — AB (ref >60.00)
Glucose, Bld: 116 mg/dL — ABNORMAL HIGH (ref 70–99)
POTASSIUM: 4.8 meq/L (ref 3.5–5.1)
Sodium: 141 mEq/L (ref 135–145)

## 2015-09-20 ENCOUNTER — Encounter: Payer: Medicare Other | Admitting: Internal Medicine

## 2015-10-02 ENCOUNTER — Encounter: Payer: Self-pay | Admitting: Internal Medicine

## 2015-10-02 ENCOUNTER — Ambulatory Visit (INDEPENDENT_AMBULATORY_CARE_PROVIDER_SITE_OTHER): Payer: Medicare Other | Admitting: Internal Medicine

## 2015-10-02 VITALS — BP 120/80 | HR 100 | Temp 98.3°F | Resp 20 | Ht 64.0 in | Wt 190.0 lb

## 2015-10-02 DIAGNOSIS — E7800 Pure hypercholesterolemia, unspecified: Secondary | ICD-10-CM

## 2015-10-02 DIAGNOSIS — I10 Essential (primary) hypertension: Secondary | ICD-10-CM | POA: Diagnosis not present

## 2015-10-02 DIAGNOSIS — D649 Anemia, unspecified: Secondary | ICD-10-CM

## 2015-10-02 DIAGNOSIS — Z23 Encounter for immunization: Secondary | ICD-10-CM | POA: Diagnosis not present

## 2015-10-02 DIAGNOSIS — E0821 Diabetes mellitus due to underlying condition with diabetic nephropathy: Secondary | ICD-10-CM | POA: Diagnosis not present

## 2015-10-02 DIAGNOSIS — Z0000 Encounter for general adult medical examination without abnormal findings: Secondary | ICD-10-CM | POA: Diagnosis not present

## 2015-10-02 NOTE — Patient Instructions (Signed)
Limit your sodium (Salt) intake    It is important that you exercise regularly, at least 20 minutes 3 to 4 times per week.  If you develop chest pain or shortness of breath seek  medical attention.  You need to lose weight.  Consider a lower calorie diet and regular exercise.   Please check your hemoglobin A1c every 3-6  Months   Menopause is a normal process in which your reproductive ability comes to an end. This process happens gradually over a span of months to years, usually between the ages of 4 and 60. Menopause is complete when you have missed 12 consecutive menstrual periods. It is important to talk with your health care provider about some of the most common conditions that affect postmenopausal women, such as heart disease, cancer, and bone loss (osteoporosis). Adopting a healthy lifestyle and getting preventive care can help to promote your health and wellness. Those actions can also lower your chances of developing some of these common conditions. WHAT SHOULD I KNOW ABOUT MENOPAUSE? During menopause, you may experience a number of symptoms, such as:  Moderate-to-severe hot flashes.  Night sweats.  Decrease in sex drive.  Mood swings.  Headaches.  Tiredness.  Irritability.  Memory problems.  Insomnia. Choosing to treat or not to treat menopausal changes is an individual decision that you make with your health care provider. WHAT SHOULD I KNOW ABOUT HORMONE REPLACEMENT THERAPY AND SUPPLEMENTS? Hormone therapy products are effective for treating symptoms that are associated with menopause, such as hot flashes and night sweats. Hormone replacement carries certain risks, especially as you become older. If you are thinking about using estrogen or estrogen with progestin treatments, discuss the benefits and risks with your health care provider. WHAT SHOULD I KNOW ABOUT HEART DISEASE AND STROKE? Heart disease, heart attack, and stroke become more likely as you age. This may  be due, in part, to the hormonal changes that your body experiences during menopause. These can affect how your body processes dietary fats, triglycerides, and cholesterol. Heart attack and stroke are both medical emergencies. There are many things that you can do to help prevent heart disease and stroke:  Have your blood pressure checked at least every 1-2 years. High blood pressure causes heart disease and increases the risk of stroke.  If you are 8-26 years old, ask your health care provider if you should take aspirin to prevent a heart attack or a stroke.  Do not use any tobacco products, including cigarettes, chewing tobacco, or electronic cigarettes. If you need help quitting, ask your health care provider.  It is important to eat a healthy diet and maintain a healthy weight.  Be sure to include plenty of vegetables, fruits, low-fat dairy products, and lean protein.  Avoid eating foods that are high in solid fats, added sugars, or salt (sodium).  Get regular exercise. This is one of the most important things that you can do for your health.  Try to exercise for at least 150 minutes each week. The type of exercise that you do should increase your heart rate and make you sweat. This is known as moderate-intensity exercise.  Try to do strengthening exercises at least twice each week. Do these in addition to the moderate-intensity exercise.  Know your numbers.Ask your health care provider to check your cholesterol and your blood glucose. Continue to have your blood tested as directed by your health care provider. WHAT SHOULD I KNOW ABOUT CANCER SCREENING? There are several types of cancer. Take  the following steps to reduce your risk and to catch any cancer development as early as possible. Breast Cancer  Practice breast self-awareness.  This means understanding how your breasts normally appear and feel.  It also means doing regular breast self-exams. Let your health care provider  know about any changes, no matter how small.  If you are 6 or older, have a clinician do a breast exam (clinical breast exam or CBE) every year. Depending on your age, family history, and medical history, it may be recommended that you also have a yearly breast X-ray (mammogram).  If you have a family history of breast cancer, talk with your health care provider about genetic screening.  If you are at high risk for breast cancer, talk with your health care provider about having an MRI and a mammogram every year.  Breast cancer (BRCA) gene test is recommended for women who have family members with BRCA-related cancers. Results of the assessment will determine the need for genetic counseling and BRCA1 and for BRCA2 testing. BRCA-related cancers include these types:  Breast. This occurs in males or females.  Ovarian.  Tubal. This may also be called fallopian tube cancer.  Cancer of the abdominal or pelvic lining (peritoneal cancer).  Prostate.  Pancreatic. Cervical, Uterine, and Ovarian Cancer Your health care provider may recommend that you be screened regularly for cancer of the pelvic organs. These include your ovaries, uterus, and vagina. This screening involves a pelvic exam, which includes checking for microscopic changes to the surface of your cervix (Pap test).  For women ages 21-65, health care providers may recommend a pelvic exam and a Pap test every three years. For women ages 65-65, they may recommend the Pap test and pelvic exam, combined with testing for human papilloma virus (HPV), every five years. Some types of HPV increase your risk of cervical cancer. Testing for HPV may also be done on women of any age who have unclear Pap test results.  Other health care providers may not recommend any screening for nonpregnant women who are considered low risk for pelvic cancer and have no symptoms. Ask your health care provider if a screening pelvic exam is right for you.  If you  have had past treatment for cervical cancer or a condition that could lead to cancer, you need Pap tests and screening for cancer for at least 20 years after your treatment. If Pap tests have been discontinued for you, your risk factors (such as having a new sexual partner) need to be reassessed to determine if you should start having screenings again. Some women have medical problems that increase the chance of getting cervical cancer. In these cases, your health care provider may recommend that you have screening and Pap tests more often.  If you have a family history of uterine cancer or ovarian cancer, talk with your health care provider about genetic screening.  If you have vaginal bleeding after reaching menopause, tell your health care provider.  There are currently no reliable tests available to screen for ovarian cancer. Lung Cancer Lung cancer screening is recommended for adults 81-72 years old who are at high risk for lung cancer because of a history of smoking. A yearly low-dose CT scan of the lungs is recommended if you:  Currently smoke.  Have a history of at least 30 pack-years of smoking and you currently smoke or have quit within the past 15 years. A pack-year is smoking an average of one pack of cigarettes per day for  one year. Yearly screening should:  Continue until it has been 15 years since you quit.  Stop if you develop a health problem that would prevent you from having lung cancer treatment. Colorectal Cancer  This type of cancer can be detected and can often be prevented.  Routine colorectal cancer screening usually begins at age 74 and continues through age 73.  If you have risk factors for colon cancer, your health care provider may recommend that you be screened at an earlier age.  If you have a family history of colorectal cancer, talk with your health care provider about genetic screening.  Your health care provider may also recommend using home test kits to  check for hidden blood in your stool.  A small camera at the end of a tube can be used to examine your colon directly (sigmoidoscopy or colonoscopy). This is done to check for the earliest forms of colorectal cancer.  Direct examination of the colon should be repeated every 5-10 years until age 83. However, if early forms of precancerous polyps or small growths are found or if you have a family history or genetic risk for colorectal cancer, you may need to be screened more often. Skin Cancer  Check your skin from head to toe regularly.  Monitor any moles. Be sure to tell your health care provider:  About any new moles or changes in moles, especially if there is a change in a mole's shape or color.  If you have a mole that is larger than the size of a pencil eraser.  If any of your family members has a history of skin cancer, especially at a young age, talk with your health care provider about genetic screening.  Always use sunscreen. Apply sunscreen liberally and repeatedly throughout the day.  Whenever you are outside, protect yourself by wearing long sleeves, pants, a wide-brimmed hat, and sunglasses. WHAT SHOULD I KNOW ABOUT OSTEOPOROSIS? Osteoporosis is a condition in which bone destruction happens more quickly than new bone creation. After menopause, you may be at an increased risk for osteoporosis. To help prevent osteoporosis or the bone fractures that can happen because of osteoporosis, the following is recommended:  If you are 34-16 years old, get at least 1,000 mg of calcium and at least 600 mg of vitamin D per day.  If you are older than age 36 but younger than age 55, get at least 1,200 mg of calcium and at least 600 mg of vitamin D per day.  If you are older than age 65, get at least 1,200 mg of calcium and at least 800 mg of vitamin D per day. Smoking and excessive alcohol intake increase the risk of osteoporosis. Eat foods that are rich in calcium and vitamin D, and do  weight-bearing exercises several times each week as directed by your health care provider. WHAT SHOULD I KNOW ABOUT HOW MENOPAUSE AFFECTS [place]? Depression may occur at any age, but it is more common as you become older. Common symptoms of depression include:  Low or sad mood.  Changes in sleep patterns.  Changes in appetite or eating patterns.  Feeling an overall lack of motivation or enjoyment of activities that you previously enjoyed.  Frequent crying spells. Talk with your health care provider if you think that you are experiencing depression. WHAT SHOULD I KNOW ABOUT IMMUNIZATIONS? It is important that you get and maintain your immunizations. These include:  Tetanus, diphtheria, and pertussis (Tdap) booster vaccine.  Influenza every year before  the flu season begins.  Pneumonia vaccine.  Shingles vaccine. Your health care provider may also recommend other immunizations.   This information is not intended to replace advice given to you by your health care provider. Make sure you discuss any questions you have with your health care provider.   Document Released: 01/23/2006 Document Revised: 12/22/2014 Document Reviewed: 08/03/2014 Elsevier Interactive Patient Education [employer].

## 2015-10-02 NOTE — Progress Notes (Signed)
Subjective:    Patient ID: Danielle Atkins, female    DOB: [birth year], 80 y.o.   MRN: [id number]  HPI  Wt Readings from Last 3 Encounters:  10/02/15 190 lb (86.183 kg)  05/21/15 190 lb (86.183 kg)  10/03/14 187 lb (84.823 kg)    Lab Results  Component Value Date   HGBA1C 6.6* 05/21/2015    Subjective:    Patient ID: Danielle Atkins, female    DOB: 04/12/1935, 80 y.o.   MRN: [id number]  HPI History of Present Illness:   80  year-old patient seen today for a comprehensive evaluation.  Medical problems include type 2 diabetes, chronic renal insufficiency, history of dyslipidemia. She has a history of a prior TIA. No concerns or complaints today. She has treated hypertension. Her last hemoglobin A1c was well controlled. No focal neurological symptoms. Denies any cardiopulmonary symptoms   Preventive Screening-Counseling & Management  Caffeine-Diet-Exercise  Does Patient Exercise: yes   Allergies:  No Known Drug Allergies   Past History:  Past Medical History:   Diabetes mellitus, type II  Hyperlipidemia  Hypertension  Anemia-NOS  Peptic ulcer disease  Renal insufficiency  RBBB and left anterior hemiblock  Transient ischemic attack, hx of  history of anemia, secondary to thalassemia trait  history of left brain TIA 2005   Past Surgical History:   Appendectomy 55  Hysterectomy 80  Tonsillectomy 46  colonoscopy 2003 or 2004   Family History:  Reviewed history from 06/25/2007 and no changes required.  father died at age 107 of the prostate cancer. Mother died in her late 72s of renal cancer  Two brothers, positive for diabetes, and hypertension  Family History Diabetes 1st degree relative  Family History Kidney disease  Family History of Prostate CA 1st degree relative <50   Social History:  Reviewed history from 01/21/2008 and no changes required.  Married  Regular exercise-yes  Does Patient Exercise: yes  1. Risk factors, based on past  M,S,F history- and  cardiovascular risk factors include hypertension dyslipidemia and type 2 diabetes. She also has a history of prior TIA.    2.  Physical activities:Remains fairly active does walk frequently. No exercise limitation   3.  Depression/mood:No history depression or mood disorder   4.  Hearing:No deficits   5.  ADL's:Independent in all aspects of daily living   6.  Fall risk:Low   7.  Home safety:No problems identified   8.  Height weight, and visual acuity; height and weight stable. No visual dysfunction. Has had a recent eye exam with Dr.  Katy Fitch  9.  Counseling:Weight loss regular exercise restricted sodium diet all encouraged   10. Lab orders based on risk factors:Laboratory profile reviewed this did not include a hemoglobin A1c we'll recheck today  11. Referral : Followup mammogram in December as planned 12. Care plan: [name] continue present regimen followup mammogram in December. Will need colonoscopy in one or 2 years   66. Cognitive assessment: Alert and oriented with normal affect no cognitive dysfunction.  14.  Preventive services will include annual clinical examinations with screening lab.  She will continue to have annual mammograms and annual eye examinations.  15.  Provider list includes primary care ophthalmology and radiology     Review of Systems     Objective:   Physical Exam        Assessment & Plan:     Review of Systems  Constitutional: Negative for fever, appetite change, fatigue and unexpected weight change.  HENT: Negative for congestion, dental problem, ear pain, hearing loss, mouth sores, nosebleeds, sinus pressure, sore throat, tinnitus, trouble swallowing and voice change.   Eyes: Negative for photophobia, pain, redness and visual disturbance.  Respiratory: Negative for cough, chest tightness and shortness of breath.   Cardiovascular: Negative for chest pain, palpitations and leg swelling.  Gastrointestinal: Negative for nausea, vomiting,  abdominal pain, diarrhea, constipation, blood in stool, abdominal distention and rectal pain.  Genitourinary: Negative for dysuria, urgency, frequency, hematuria, flank pain, vaginal bleeding, vaginal discharge, difficulty urinating, genital sores, vaginal pain, menstrual problem and pelvic pain.  Musculoskeletal: Negative for back pain, arthralgias and neck stiffness.  Skin: Negative for rash.  Neurological: Negative for dizziness, syncope, speech difficulty, weakness, light-headedness, numbness and headaches.  Hematological: Negative for adenopathy. Does not bruise/bleed easily.  Psychiatric/Behavioral: Negative for suicidal ideas, behavioral problems, self-injury, dysphoric mood and agitation. The patient is not nervous/anxious.        Objective:   Physical Exam  Constitutional: She is oriented to person, place, and time. She appears well-developed and well-nourished.  HENT:  Head: Normocephalic and atraumatic.  Right Ear: External ear normal.  Left Ear: External ear normal.  Mouth/Throat: Oropharynx is clear and moist.  Eyes: Conjunctivae and EOM are normal.  Neck: Normal range of motion. Neck supple. No JVD present. No thyromegaly present.  Cardiovascular: Normal rate, regular rhythm, normal heart sounds and intact distal pulses.   No murmur heard. Pedal pulses full  Pulmonary/Chest: Effort normal and breath sounds normal. She has no wheezes. She has no rales.  Abdominal: Soft. Bowel sounds are normal. She exhibits no distension and no mass. There is no tenderness. There is no rebound and no guarding.  Musculoskeletal: Normal range of motion. She exhibits no edema or tenderness.  Neurological: She is alert and oriented to person, place, and time. She has normal reflexes. No cranial nerve deficit. She exhibits normal muscle tone. Coordination normal.  Skin: Skin is warm and dry. No rash noted.  Psychiatric: She has a normal mood and affect. Her behavior is normal.            Assessment & Plan:    Preventive health care.  Flu vaccine administered Type 2 diabetes.  Well-controlled.  Weight loss encouraged.  Essential hypertension, stable Dyslipidemia.  Continue statin therapy Chronic kidney disease, stable.  Continue ACE inhibitor  Recheck 3-6 months

## 2015-10-02 NOTE — Progress Notes (Signed)
Pre visit review using our clinic review tool, if applicable. No additional management support is needed unless otherwise documented below in the visit note. 

## 2015-11-29 ENCOUNTER — Other Ambulatory Visit: Payer: Self-pay | Admitting: Internal Medicine

## 2015-11-29 MED ORDER — SIMVASTATIN 40 MG PO TABS: ORAL_TABLET | ORAL | Status: DC

## 2015-11-29 MED ORDER — CIMETIDINE 400 MG PO TABS: ORAL_TABLET | ORAL | Status: DC

## 2015-11-29 MED ORDER — LISINOPRIL 10 MG PO TABS: 10.0000 mg | ORAL_TABLET | Freq: Every day | ORAL | Status: DC

## 2015-11-29 NOTE — Telephone Encounter (Signed)
Pt needs refills on simvastatin, cimetidine and lisinopril #90 each w/refills send to [address]

## 2015-11-29 NOTE — Telephone Encounter (Signed)
Rx was sent to pharmacy. 

## 2016-01-09 ENCOUNTER — Other Ambulatory Visit: Payer: Self-pay | Admitting: Internal Medicine

## 2016-03-12 DIAGNOSIS — H02825 Cysts of left lower eyelid: Secondary | ICD-10-CM | POA: Diagnosis not present

## 2016-03-12 DIAGNOSIS — E119 Type 2 diabetes mellitus without complications: Secondary | ICD-10-CM | POA: Diagnosis not present

## 2016-03-12 DIAGNOSIS — Z961 Presence of intraocular lens: Secondary | ICD-10-CM | POA: Diagnosis not present

## 2016-03-12 LAB — HM DIABETES EYE EXAM

## 2016-04-01 ENCOUNTER — Ambulatory Visit: Payer: Medicare Other | Admitting: Internal Medicine

## 2016-04-08 ENCOUNTER — Ambulatory Visit (INDEPENDENT_AMBULATORY_CARE_PROVIDER_SITE_OTHER): Payer: Medicare Other | Admitting: Internal Medicine

## 2016-04-08 ENCOUNTER — Other Ambulatory Visit: Payer: Self-pay | Admitting: *Deleted

## 2016-04-08 ENCOUNTER — Encounter: Payer: Self-pay | Admitting: Internal Medicine

## 2016-04-08 VITALS — BP 130/90 | HR 88 | Temp 98.3°F | Resp 20 | Ht 64.0 in | Wt 190.0 lb

## 2016-04-08 DIAGNOSIS — I10 Essential (primary) hypertension: Secondary | ICD-10-CM | POA: Diagnosis not present

## 2016-04-08 DIAGNOSIS — E0821 Diabetes mellitus due to underlying condition with diabetic nephropathy: Secondary | ICD-10-CM

## 2016-04-08 DIAGNOSIS — E7800 Pure hypercholesterolemia, unspecified: Secondary | ICD-10-CM

## 2016-04-08 LAB — CBC WITH DIFFERENTIAL/PLATELET
Basophils Absolute: 0 10*3/uL (ref 0.0–0.1)
Basophils Relative: 0.1 % (ref 0.0–3.0)
Eosinophils Absolute: 0.5 10*3/uL (ref 0.0–0.7)
Eosinophils Relative: 6 % — ABNORMAL HIGH (ref 0.0–5.0)
HCT: 36.8 % (ref 36.0–46.0)
Hemoglobin: 11.4 g/dL — ABNORMAL LOW (ref 12.0–15.0)
Lymphocytes Relative: 27.9 % (ref 12.0–46.0)
Lymphs Abs: 2.1 10*3/uL (ref 0.7–4.0)
MCHC: 30.9 g/dL (ref 30.0–36.0)
MCV: 69.9 fl — ABNORMAL LOW (ref 78.0–100.0)
Monocytes Absolute: 0.5 10*3/uL (ref 0.1–1.0)
Monocytes Relative: 6.4 % (ref 3.0–12.0)
Neutro Abs: 4.5 10*3/uL (ref 1.4–7.7)
Neutrophils Relative %: 59.6 % (ref 43.0–77.0)
Platelets: 239 10*3/uL (ref 150.0–400.0)
RBC: 5.26 Mil/uL — ABNORMAL HIGH (ref 3.87–5.11)
RDW: 18.7 % — ABNORMAL HIGH (ref 11.5–15.5)
WBC: 7.6 10*3/uL (ref 4.0–10.5)

## 2016-04-08 LAB — TSH: TSH: 3.47 u[IU]/mL (ref 0.35–4.50)

## 2016-04-08 LAB — LIPID PANEL
Cholesterol: 143 mg/dL (ref 0–200)
HDL: 47.4 mg/dL (ref >39.00)
LDL Cholesterol: 72 mg/dL (ref 0–99)
NonHDL: 95.64
Total CHOL/HDL Ratio: 3
Triglycerides: 117 mg/dL (ref 0.0–149.0)
VLDL: 23.4 mg/dL (ref 0.0–40.0)

## 2016-04-08 LAB — MICROALBUMIN / CREATININE URINE RATIO
Creatinine,U: 259.7 mg/dL
Microalb Creat Ratio: 2 mg/g (ref 0.0–30.0)
Microalb, Ur: 5.1 mg/dL — ABNORMAL HIGH (ref 0.0–1.9)

## 2016-04-08 LAB — COMPREHENSIVE METABOLIC PANEL
ALK PHOS: 50 U/L (ref 39–117)
ALT: 11 U/L (ref 0–35)
AST: 20 U/L (ref 0–37)
Albumin: 3.9 g/dL (ref 3.5–5.2)
BUN: 10 mg/dL (ref 6–23)
CO2: 27 mEq/L (ref 19–32)
Calcium: 9.2 mg/dL (ref 8.4–10.5)
Chloride: 107 mEq/L (ref 96–112)
Creatinine, Ser: 1.6 mg/dL — ABNORMAL HIGH (ref 0.40–1.20)
GFR: 39.97 mL/min — ABNORMAL LOW (ref >60.00)
GLUCOSE: 115 mg/dL — AB (ref 70–99)
POTASSIUM: 4.1 meq/L (ref 3.5–5.1)
Sodium: 141 mEq/L (ref 135–145)
TOTAL PROTEIN: 6.9 g/dL (ref 6.0–8.3)
Total Bilirubin: 0.8 mg/dL (ref 0.2–1.2)

## 2016-04-08 LAB — HEMOGLOBIN A1C: Hgb A1c MFr Bld: 6.6 % — ABNORMAL HIGH (ref 4.6–6.5)

## 2016-04-08 MED ORDER — METFORMIN HCL 500 MG PO TABS: 500.0000 mg | ORAL_TABLET | Freq: Every day | ORAL | Status: DC

## 2016-04-08 MED ORDER — ALLOPURINOL 300 MG PO TABS: ORAL_TABLET | ORAL | Status: DC

## 2016-04-08 MED ORDER — LISINOPRIL 10 MG PO TABS: 10.0000 mg | ORAL_TABLET | Freq: Every day | ORAL | Status: DC

## 2016-04-08 MED ORDER — SIMVASTATIN 40 MG PO TABS: ORAL_TABLET | ORAL | Status: DC

## 2016-04-08 MED ORDER — CIMETIDINE 400 MG PO TABS: ORAL_TABLET | ORAL | Status: DC

## 2016-04-08 MED ORDER — ALBUTEROL SULFATE HFA 108 (90 BASE) MCG/ACT IN AERS: 2.0000 | INHALATION_SPRAY | Freq: Four times a day (QID) | RESPIRATORY_TRACT | Status: DC | PRN

## 2016-04-08 NOTE — Patient Instructions (Signed)
Limit your sodium (Salt) intake    It is important that you exercise regularly, at least 20 minutes 3 to 4 times per week.  If you develop chest pain or shortness of breath seek  medical attention.   Please check your hemoglobin A1c every 3-6 months

## 2016-04-08 NOTE — Progress Notes (Signed)
Subjective:    Patient ID: Danielle Atkins, female    DOB: September 28, 1935, 81 y.o.   MRN: [id number]  HPI  Lab Results  Component Value Date   HGBA1C 6.6* 05/21/2015   BP Readings from Last 3 Encounters:  04/08/16 130/90  10/02/15 120/80  05/21/15 31/76  81 year old Patient Who Is Seen Today for Her Biannual Follow-Up.  She has type 2 diabetes which has been managed with submaximal metformin dose.  She has chronic kidney disease.  She has a history of dyslipidemia.  She remains on statin therapy.  She has essential hypertension.  No new concerns or complaints.  She remains quite active with travel. Eye examination 2 months ago.  Past Medical History  Diagnosis Date  . ANEMIA-NOS 06/25/2007  . GOUT 11/16/2009  . PARESTHESIA, HANDS 03/27/2009  . PEPTIC ULCER DISEASE 06/25/2007  . RENAL INSUFFICIENCY 06/25/2007  . TRANSIENT ISCHEMIC ATTACK, HX OF 06/25/2007     Social History   Social History  . Marital Status: Widowed    Spouse Name: N/A  . Number of Children: N/A  . Years of Education: N/A   Occupational History  . Not on file.   Social History Main Topics  . Smoking status: Former Smoker    Quit date: 12/16/1999  . Smokeless tobacco: Never Used  . Alcohol Use: No  . Drug Use: No  . Sexual Activity: Not on file   Other Topics Concern  . Not on file   Social History Narrative    No past surgical history on file.  No family history on file.  No Known Allergies  Current Outpatient Prescriptions on File Prior to Visit  Medication Sig Dispense Refill  . albuterol (PROAIR HFA) 108 (90 BASE) MCG/ACT inhaler Inhale 2 puffs into the lungs every 6 (six) hours as needed for wheezing. 18 g 5  . allopurinol (ZYLOPRIM) 300 MG tablet TAKE 1 TABLET (300 MG TOTAL) BY MOUTH DAILY. 90 tablet 1  . aspirin 81 MG tablet Take 1 tablet (81 mg total) by mouth daily. 30 tablet   . cimetidine (TAGAMET) 400 MG tablet TAKE 1 TABLET (400 MG TOTAL) BY MOUTH 2 (TWO) TIMES DAILY. 180 tablet 1  .  glucose blood test strip 1 each by Other route daily as needed for other. 100 each 12  . lisinopril (PRINIVIL,ZESTRIL) 10 MG tablet Take 1 tablet (10 mg total) by mouth daily. 90 tablet 1  . metFORMIN (GLUCOPHAGE) 500 MG tablet TAKE 1 TABLET (500 MG TOTAL) BY MOUTH 2 (TWO) TIMES DAILY WITH A MEAL. (Patient taking differently: Take 500 mg by mouth daily with breakfast. TAKE 1 TABLET (500 MG TOTAL) BY MOUTH 2 (TWO) TIMES DAILY WITH A MEAL.) 180 tablet 1  . Multiple Vitamin (MULTIVITAMIN WITH MINERALS) TABS Take 1 tablet by mouth daily.    . simvastatin (ZOCOR) 40 MG tablet TAKE 1 TABLET (40 MG TOTAL) BY MOUTH DAILY. 90 tablet 1   No current facility-administered medications on file prior to visit.    BP 130/90 mmHg  Pulse 88  Temp(Src) 98.3 F (36.8 C) (Oral)  Resp 20  Ht 5\' 4"  (1.626 m)  Wt 190 lb (86.183 kg)  BMI 32.60 kg/m2  SpO2 94%     Review of Systems  Constitutional: Negative.   HENT: Negative for congestion, dental problem, hearing loss, rhinorrhea, sinus pressure, sore throat and tinnitus.   Eyes: Negative for pain, discharge and visual disturbance.  Respiratory: Negative for cough and shortness of breath.   Cardiovascular: Negative  for chest pain, palpitations and leg swelling.  Gastrointestinal: Negative for nausea, vomiting, abdominal pain, diarrhea, constipation, blood in stool and abdominal distention.  Genitourinary: Negative for dysuria, urgency, frequency, hematuria, flank pain, vaginal bleeding, vaginal discharge, difficulty urinating, vaginal pain and pelvic pain.  Musculoskeletal: Negative for joint swelling, arthralgias and gait problem.  Skin: Negative for rash.  Neurological: Negative for dizziness, syncope, speech difficulty, weakness, numbness and headaches.  Hematological: Negative for adenopathy.  Psychiatric/Behavioral: Negative for behavioral problems, dysphoric mood and agitation. The patient is not nervous/anxious.        Objective:   Physical Exam   Constitutional: She is oriented to person, place, and time. She appears well-developed and well-nourished.  Repeat blood pressure 122/84 Weight 190  HENT:  Head: Normocephalic.  Right Ear: External ear normal.  Left Ear: External ear normal.  Mouth/Throat: Oropharynx is clear and moist.  Eyes: Conjunctivae and EOM are normal. Pupils are equal, round, and reactive to light.  Neck: Normal range of motion. Neck supple. No thyromegaly present.  Cardiovascular: Normal rate, regular rhythm, normal heart sounds and intact distal pulses.   Pulmonary/Chest: Effort normal and breath sounds normal.  Abdominal: Soft. Bowel sounds are normal. She exhibits no mass. There is no tenderness.  Musculoskeletal: Normal range of motion.  Lymphadenopathy:    She has no cervical adenopathy.  Neurological: She is alert and oriented to person, place, and time.  Skin: Skin is warm and dry. No rash noted.  Psychiatric: She has a normal mood and affect. Her behavior is normal.          Assessment & Plan:  Diabetes mellitus.  Will check a hemoglobin A1c, urine for microalbumin Dyslipidemia.  We'll check a lipid profile.  Continue statin therapy Essential hypertension, stable Obesity.  Weight loss encouraged  CPX 6 months

## 2016-04-08 NOTE — Progress Notes (Signed)
Pre visit review using our clinic review tool, if applicable. No additional management support is needed unless otherwise documented below in the visit note. 

## 2016-04-24 ENCOUNTER — Encounter: Payer: Self-pay | Admitting: Internal Medicine

## 2016-07-28 DIAGNOSIS — M25551 Pain in right hip: Secondary | ICD-10-CM | POA: Diagnosis not present

## 2016-10-01 ENCOUNTER — Other Ambulatory Visit: Payer: Self-pay | Admitting: Internal Medicine

## 2016-10-01 ENCOUNTER — Other Ambulatory Visit (INDEPENDENT_AMBULATORY_CARE_PROVIDER_SITE_OTHER): Payer: Medicare Other

## 2016-10-01 DIAGNOSIS — Z0000 Encounter for general adult medical examination without abnormal findings: Secondary | ICD-10-CM | POA: Diagnosis not present

## 2016-10-01 LAB — BASIC METABOLIC PANEL
BUN: 18 mg/dL (ref 6–23)
CO2: 27 meq/L (ref 19–32)
CREATININE: 1.64 mg/dL — AB (ref 0.40–1.20)
Calcium: 9.8 mg/dL (ref 8.4–10.5)
Chloride: 105 mEq/L (ref 96–112)
GFR: 38.8 mL/min — ABNORMAL LOW (ref >60.00)
GLUCOSE: 101 mg/dL — AB (ref 70–99)
Potassium: 4 mEq/L (ref 3.5–5.1)
SODIUM: 140 meq/L (ref 135–145)

## 2016-10-01 LAB — HEPATIC FUNCTION PANEL
ALT: 14 U/L (ref 0–35)
AST: 20 U/L (ref 0–37)
Albumin: 4.2 g/dL (ref 3.5–5.2)
Alkaline Phosphatase: 53 U/L (ref 39–117)
Bilirubin, Direct: 0.2 mg/dL (ref 0.0–0.3)
TOTAL PROTEIN: 7.8 g/dL (ref 6.0–8.3)
Total Bilirubin: 0.8 mg/dL (ref 0.2–1.2)

## 2016-10-01 LAB — POC URINALSYSI DIPSTICK (AUTOMATED)
Bilirubin, UA: NEGATIVE
Glucose, UA: NEGATIVE
Ketones, UA: NEGATIVE
LEUKOCYTES UA: NEGATIVE
Nitrite, UA: NEGATIVE
UROBILINOGEN UA: 0.2
pH, UA: 5.5

## 2016-10-01 LAB — CBC WITH DIFFERENTIAL/PLATELET
Basophils Absolute: 0.1 10*3/uL (ref 0.0–0.1)
Basophils Relative: 0.7 % (ref 0.0–3.0)
EOS ABS: 0.5 10*3/uL (ref 0.0–0.7)
Eosinophils Relative: 6.2 % — ABNORMAL HIGH (ref 0.0–5.0)
HCT: 37.2 % (ref 36.0–46.0)
Hemoglobin: 11.7 g/dL — ABNORMAL LOW (ref 12.0–15.0)
Lymphocytes Relative: 38 % (ref 12.0–46.0)
Lymphs Abs: 3.1 10*3/uL (ref 0.7–4.0)
MCHC: 31.5 g/dL (ref 30.0–36.0)
MONO ABS: 0.6 10*3/uL (ref 0.1–1.0)
MONOS PCT: 7.6 % (ref 3.0–12.0)
NEUTROS ABS: 3.8 10*3/uL (ref 1.4–7.7)
Neutrophils Relative %: 47.5 % (ref 43.0–77.0)
PLATELETS: 261 10*3/uL (ref 150.0–400.0)
RBC: 5.34 Mil/uL — AB (ref 3.87–5.11)
RDW: 18.5 % — ABNORMAL HIGH (ref 11.5–15.5)
WBC: 8.1 10*3/uL (ref 4.0–10.5)

## 2016-10-01 LAB — TSH: TSH: 5.43 u[IU]/mL — ABNORMAL HIGH (ref 0.35–4.50)

## 2016-10-01 LAB — LIPID PANEL
CHOL/HDL RATIO: 3
CHOLESTEROL: 138 mg/dL (ref 0–200)
HDL: 48.9 mg/dL (ref >39.00)
LDL Cholesterol: 69 mg/dL (ref 0–99)
NonHDL: 89.45
Triglycerides: 101 mg/dL (ref 0.0–149.0)
VLDL: 20.2 mg/dL (ref 0.0–40.0)

## 2016-10-01 LAB — MICROALBUMIN / CREATININE URINE RATIO
Creatinine,U: 428.5 mg/dL
MICROALB/CREAT RATIO: 1.4 mg/g (ref 0.0–30.0)
Microalb, Ur: 5.9 mg/dL — ABNORMAL HIGH (ref 0.0–1.9)

## 2016-10-01 LAB — HEMOGLOBIN A1C: Hgb A1c MFr Bld: 6.3 % (ref 4.6–6.5)

## 2016-10-08 ENCOUNTER — Ambulatory Visit (INDEPENDENT_AMBULATORY_CARE_PROVIDER_SITE_OTHER): Payer: Medicare Other | Admitting: Internal Medicine

## 2016-10-08 ENCOUNTER — Encounter: Payer: Self-pay | Admitting: Internal Medicine

## 2016-10-08 VITALS — BP 132/92 | HR 88 | Temp 97.6°F | Resp 17 | Ht 64.6 in | Wt 189.2 lb

## 2016-10-08 DIAGNOSIS — E0821 Diabetes mellitus due to underlying condition with diabetic nephropathy: Secondary | ICD-10-CM

## 2016-10-08 DIAGNOSIS — Z0000 Encounter for general adult medical examination without abnormal findings: Secondary | ICD-10-CM

## 2016-10-08 DIAGNOSIS — Z23 Encounter for immunization: Secondary | ICD-10-CM | POA: Diagnosis not present

## 2016-10-08 DIAGNOSIS — E7800 Pure hypercholesterolemia, unspecified: Secondary | ICD-10-CM | POA: Diagnosis not present

## 2016-10-08 DIAGNOSIS — I10 Essential (primary) hypertension: Secondary | ICD-10-CM | POA: Diagnosis not present

## 2016-10-08 NOTE — Progress Notes (Signed)
Pre visit review using our clinic review tool, if applicable. No additional management support is needed unless otherwise documented below in the visit note. 

## 2016-10-08 NOTE — Progress Notes (Signed)
Subjective:    Patient ID: Danielle Atkins, female    DOB: [birth year], 81 y.o.   MRN: [id number]  HPI   Wt Readings from Last 3 Encounters:  10/08/16 189 lb 3.2 oz (85.8 kg)  04/08/16 190 lb (86.2 kg)  10/02/15 190 lb (86.2 kg)    Lab Results  Component Value Date   HGBA1C 6.3 10/01/2016    Subjective:    Patient ID: Danielle Atkins, female    DOB: [birth year], 81 y.o.   MRN: [id number]  HPI History of Present Illness:   81  year-old patient seen today for a comprehensive evaluation.  Medical problems include type 2 diabetes, chronic renal insufficiency, history of dyslipidemia. She has a history of a prior TIA. No concerns or complaints today. She has treated hypertension. Her last hemoglobin A1c was well controlled. No focal neurological symptoms. Denies any cardiopulmonary symptoms   Preventive Screening-Counseling & Management  Caffeine-Diet-Exercise  Does Patient Exercise: yes   Allergies:  No Known Drug Allergies   Past History:  Past Medical History:   Diabetes mellitus, type II  Hyperlipidemia  Hypertension  Anemia-NOS  Peptic ulcer disease  Renal insufficiency  RBBB and left anterior hemiblock  Transient ischemic attack, hx of  history of anemia, secondary to thalassemia trait  history of left brain TIA 2005   Past Surgical History:   Appendectomy 55  Hysterectomy 80  Tonsillectomy 46  colonoscopy 2003 or 2004   Family History:    father died at age 57 of the prostate cancer. Mother died in her late 51s of renal cancer  Two brothers, positive for diabetes, and hypertension  Family History Diabetes 1st degree relative  Family History Kidney disease  Family History of Prostate CA 1st degree relative <50   Social History:  Marland Kitchen  Married  Regular exercise-yes  Does Patient Exercise: yes  Medicare wellness  1. Risk factors, based on past  M,S,F history- and cardiovascular risk factors include hypertension dyslipidemia and type 2 diabetes. She also has  a history of prior TIA.    2.  Physical activities:Remains fairly active does walk frequently. No exercise limitation; participates in [employer] program 5 times per week   3.  Depression/mood:No history depression or mood disorder   4.  Hearing:No deficits   5.  ADL's:Independent in all aspects of daily living   6.  Fall risk:Low   7.  Home safety:No problems identified   8.  Height weight, and visual acuity; height and weight stable. No visual dysfunction. Has had a recent eye exam with Dr.  Katy Fitch  9.  Counseling:Weight loss regular exercise restricted sodium diet all encouraged   10. Lab orders based on risk factors:Laboratory profile reviewed this did not include a hemoglobin A1c we'll recheck today  11. Referral : Followup mammogram in December as planned 12. Care plan: [name] continue present regimen followup mammogram in December. Will need colonoscopy in one or 2 years   44. Cognitive assessment: Alert and oriented with normal affect no cognitive dysfunction.  14.  Preventive services will include annual clinical examinations with screening lab.  She will continue to have annual mammograms and annual eye examinations.  15.  Provider list includes primary care ophthalmology and radiology     Review of Systems     Objective:   Physical Exam  See below    Assessment & Plan:   See below  Review of Systems  Constitutional: Negative for appetite change, fatigue, fever and unexpected weight change.  HENT: Negative for congestion, dental problem, ear pain, hearing loss, mouth sores, nosebleeds, sinus pressure, sore throat, tinnitus, trouble swallowing and voice change.   Eyes: Negative for photophobia, pain, redness and visual disturbance.  Respiratory: Negative for cough, chest tightness and shortness of breath.   Cardiovascular: Negative for chest pain, palpitations and leg swelling.  Gastrointestinal: Negative for abdominal distention, abdominal pain, blood in  stool, constipation, diarrhea, nausea, rectal pain and vomiting.  Genitourinary: Negative for difficulty urinating, dysuria, flank pain, frequency, genital sores, hematuria, menstrual problem, pelvic pain, urgency, vaginal bleeding, vaginal discharge and vaginal pain.  Musculoskeletal: Negative for arthralgias, back pain and neck stiffness.  Skin: Negative for rash.  Neurological: Negative for dizziness, syncope, speech difficulty, weakness, light-headedness, numbness and headaches.  Hematological: Negative for adenopathy. Does not bruise/bleed easily.  Psychiatric/Behavioral: Negative for agitation, behavioral problems, dysphoric mood, self-injury and suicidal ideas. The patient is not nervous/anxious.        Objective:   Physical Exam  Constitutional: She is oriented to person, place, and time. She appears well-developed and well-nourished.  HENT:  Head: Normocephalic and atraumatic.  Right Ear: External ear normal.  Left Ear: External ear normal.  Mouth/Throat: Oropharynx is clear and moist.  Eyes: Conjunctivae and EOM are normal.  Neck: Normal range of motion. Neck supple. No JVD present. No thyromegaly present.  Cardiovascular: Normal rate, regular rhythm, normal heart sounds and intact distal pulses.   No murmur heard. Pedal pulses full  Pulmonary/Chest: Effort normal and breath sounds normal. She has no wheezes. She has no rales.  Abdominal: Soft. Bowel sounds are normal. She exhibits no distension and no mass. There is no tenderness. There is no rebound and no guarding.  Musculoskeletal: Normal range of motion. She exhibits no edema or tenderness.  Neurological: She is alert and oriented to person, place, and time. She has normal reflexes. No cranial nerve deficit. She exhibits normal muscle tone. Coordination normal.  Skin: Skin is warm and dry. No rash noted.  Psychiatric: She has a normal mood and affect. Her behavior is normal.          Assessment & Plan:     Preventive health care.  Flu vaccine administered Type 2 diabetes.  Well-controlled.  Weight loss encouraged.  Annual eye examination scheduled for January  Essential hypertension, stable Dyslipidemia.  Continue statin therapy Chronic kidney disease, stable.  Continue ACE inhibitor  Recheck 3-6 months   Nyoka Cowden

## 2016-10-08 NOTE — Patient Instructions (Signed)
Limit your sodium (Salt) intake   Please check your hemoglobin A1c every 3-6  Months    It is important that you exercise regularly, at least 20 minutes 3 to 4 times per week.  If you develop chest pain or shortness of breath seek  medical attention.  Return in 6 months for follow-up  Please see your eye doctor yearly to check for diabetic eye damage   

## 2016-10-20 ENCOUNTER — Other Ambulatory Visit: Payer: Self-pay | Admitting: Internal Medicine

## 2016-12-11 ENCOUNTER — Other Ambulatory Visit: Payer: Self-pay | Admitting: Internal Medicine

## 2016-12-30 ENCOUNTER — Other Ambulatory Visit: Payer: Self-pay | Admitting: Internal Medicine

## 2017-01-08 ENCOUNTER — Other Ambulatory Visit: Payer: Self-pay | Admitting: Emergency Medicine

## 2017-01-08 MED ORDER — CIMETIDINE 400 MG PO TABS: ORAL_TABLET | ORAL | 1 refills | Status: DC

## 2017-01-08 MED ORDER — SIMVASTATIN 40 MG PO TABS: ORAL_TABLET | ORAL | 1 refills | Status: DC

## 2017-01-08 MED ORDER — METFORMIN HCL 500 MG PO TABS: 500.0000 mg | ORAL_TABLET | Freq: Every day | ORAL | 1 refills | Status: DC

## 2017-01-08 MED ORDER — ALLOPURINOL 300 MG PO TABS: ORAL_TABLET | ORAL | 1 refills | Status: DC

## 2017-01-08 MED ORDER — LISINOPRIL 10 MG PO TABS: 10.0000 mg | ORAL_TABLET | Freq: Every day | ORAL | 3 refills | Status: DC

## 2017-01-08 NOTE — Telephone Encounter (Signed)
Refill sent in

## 2017-01-13 ENCOUNTER — Telehealth: Payer: Self-pay | Admitting: Internal Medicine

## 2017-01-13 MED ORDER — METFORMIN HCL 500 MG PO TABS: 500.0000 mg | ORAL_TABLET | Freq: Every day | ORAL | 1 refills | Status: DC

## 2017-01-13 NOTE — Telephone Encounter (Signed)
Refill for  metformin 500 mg #30 was sent to  to [facility] off of [address].

## 2017-01-13 NOTE — Telephone Encounter (Signed)
Pt need metformin 500 mg #30 send to [address]. Pt is waiting on mail order

## 2017-01-22 ENCOUNTER — Other Ambulatory Visit: Payer: Self-pay | Admitting: Internal Medicine

## 2017-01-22 DIAGNOSIS — Z1231 Encounter for screening mammogram for malignant neoplasm of breast: Secondary | ICD-10-CM

## 2017-01-27 ENCOUNTER — Ambulatory Visit
Admission: RE | Admit: 2017-01-27 | Discharge: 2017-01-27 | Disposition: A | Discharge status: Home / Self Care | Payer: Medicare HMO | Source: Ambulatory Visit | Attending: Internal Medicine | Admitting: Internal Medicine

## 2017-01-27 DIAGNOSIS — Z1231 Encounter for screening mammogram for malignant neoplasm of breast: Secondary | ICD-10-CM | POA: Diagnosis not present

## 2017-04-01 DIAGNOSIS — H04123 Dry eye syndrome of bilateral lacrimal glands: Secondary | ICD-10-CM | POA: Diagnosis not present

## 2017-04-01 DIAGNOSIS — Z961 Presence of intraocular lens: Secondary | ICD-10-CM | POA: Diagnosis not present

## 2017-04-01 DIAGNOSIS — E119 Type 2 diabetes mellitus without complications: Secondary | ICD-10-CM | POA: Diagnosis not present

## 2017-04-01 DIAGNOSIS — H43812 Vitreous degeneration, left eye: Secondary | ICD-10-CM | POA: Diagnosis not present

## 2017-04-01 LAB — HM DIABETES EYE EXAM

## 2017-04-08 ENCOUNTER — Ambulatory Visit: Payer: Medicare Other | Admitting: Internal Medicine

## 2017-06-01 ENCOUNTER — Other Ambulatory Visit: Payer: Self-pay | Admitting: Internal Medicine

## 2017-06-25 ENCOUNTER — Ambulatory Visit: Payer: Medicare HMO

## 2017-07-15 ENCOUNTER — Other Ambulatory Visit: Payer: Self-pay | Admitting: Internal Medicine

## 2017-07-28 ENCOUNTER — Other Ambulatory Visit: Payer: Self-pay | Admitting: Internal Medicine

## 2017-08-25 ENCOUNTER — Ambulatory Visit (INDEPENDENT_AMBULATORY_CARE_PROVIDER_SITE_OTHER): Payer: Medicare HMO | Admitting: Internal Medicine

## 2017-08-25 ENCOUNTER — Encounter: Payer: Self-pay | Admitting: Internal Medicine

## 2017-08-25 VITALS — BP 122/78 | HR 97 | Temp 97.7°F | Ht 64.5 in | Wt 181.8 lb

## 2017-08-25 DIAGNOSIS — E0821 Diabetes mellitus due to underlying condition with diabetic nephropathy: Secondary | ICD-10-CM

## 2017-08-25 DIAGNOSIS — E119 Type 2 diabetes mellitus without complications: Secondary | ICD-10-CM

## 2017-08-25 DIAGNOSIS — Z23 Encounter for immunization: Secondary | ICD-10-CM | POA: Diagnosis not present

## 2017-08-25 DIAGNOSIS — E7800 Pure hypercholesterolemia, unspecified: Secondary | ICD-10-CM | POA: Diagnosis not present

## 2017-08-25 DIAGNOSIS — I10 Essential (primary) hypertension: Secondary | ICD-10-CM

## 2017-08-25 LAB — POCT GLYCOSYLATED HEMOGLOBIN (HGB A1C): HEMOGLOBIN A1C: 5.8

## 2017-08-25 NOTE — Patient Instructions (Signed)
Limit your sodium (Salt) intake  Please check your blood pressure on a regular basis.  If it is consistently greater than 150/90, please make an office appointment.   Please check your hemoglobin A1c every 6 months  Return in 6 months for follow-up  Please see your eye doctor yearly to check for diabetic eye damage

## 2017-08-25 NOTE — Progress Notes (Signed)
Subjective:    Patient ID: Danielle Atkins, female    DOB: January 08, 1935, 82 y.o.   MRN: [id number]  HPI Lab Results  Component Value Date   HGBA1C 5.8 08/25/2017    BP Readings from Last 3 Encounters:  08/25/17 122/78  10/08/16 (!) 132/92  04/08/16 10/22   82 year old patient who is seen today for her biannual follow-up.  She has a history of diabetes which is under excellent control on metformin therapy only. She has a history of dyslipidemia and remains on statin therapy. She has essential hypertension which also has been very well controlled  Denies any cardiopulmonary complaints  Past Medical History:  Diagnosis Date  . ANEMIA-NOS 06/25/2007  . GOUT 11/16/2009  . PARESTHESIA, HANDS 03/27/2009  . PEPTIC ULCER DISEASE 06/25/2007  . RENAL INSUFFICIENCY 06/25/2007  . TRANSIENT ISCHEMIC ATTACK, HX OF 06/25/2007     Social History   Social History  . Marital status: Widowed    Spouse name: N/A  . Number of children: N/A  . Years of education: N/A   Occupational History  . Not on file.   Social History Main Topics  . Smoking status: Former Smoker    Quit date: 12/16/1999  . Smokeless tobacco: Never Used  . Alcohol use No  . Drug use: No  . Sexual activity: Not on file   Other Topics Concern  . Not on file   Social History Narrative  . No narrative on file    No past surgical history on file.  No family history on file.  No Known Allergies  Current Outpatient Prescriptions on File Prior to Visit  Medication Sig Dispense Refill  . albuterol (PROAIR HFA) 108 (90 Base) MCG/ACT inhaler Inhale 2 puffs into the lungs every 6 (six) hours as needed for wheezing. 18 g 5  . allopurinol (ZYLOPRIM) 300 MG tablet TAKE 1 TABLET (300 MG TOTAL) BY MOUTH DAILY. 90 tablet 1  . aspirin 81 MG tablet Take 1 tablet (81 mg total) by mouth daily. 30 tablet   . cimetidine (TAGAMET) 400 MG tablet TAKE 1 TABLET (400 MG TOTAL) BY MOUTH 2 (TWO) TIMES DAILY. 180 tablet 1  . glucose blood  test strip 1 each by Other route daily as needed for other. 100 each 12  . lisinopril (PRINIVIL,ZESTRIL) 10 MG tablet Take 1 tablet (10 mg total) by mouth daily. 90 tablet 3  . metFORMIN (GLUCOPHAGE) 500 MG tablet TAKE 1 TABLET EVERY DAY  WITH  BREAKFAST 90 tablet 1  . Multiple Vitamin (MULTIVITAMIN WITH MINERALS) TABS Take 1 tablet by mouth daily.    . simvastatin (ZOCOR) 40 MG tablet TAKE 1 TABLET (40 MG TOTAL) BY MOUTH DAILY. 90 tablet 1   No current facility-administered medications on file prior to visit.     BP 122/78 (BP Location: Left Arm, Patient Position: Sitting, Cuff Size: Normal)   Pulse 97   Temp 97.7 F (36.5 C) (Oral)   Ht 5' 4.5" (1.638 m)   Wt 181 lb 12.8 oz (82.5 kg)   SpO2 97%   BMI 30.72 kg/m      Review of Systems  Constitutional: Negative.   HENT: Negative for congestion, dental problem, hearing loss, rhinorrhea, sinus pressure, sore throat and tinnitus.   Eyes: Negative for pain, discharge and visual disturbance.  Respiratory: Negative for cough and shortness of breath.   Cardiovascular: Negative for chest pain, palpitations and leg swelling.  Gastrointestinal: Negative for abdominal distention, abdominal pain, blood in stool, constipation, diarrhea, nausea  and vomiting.  Genitourinary: Negative for difficulty urinating, dysuria, flank pain, frequency, hematuria, pelvic pain, urgency, vaginal bleeding, vaginal discharge and vaginal pain.  Musculoskeletal: Negative for arthralgias, gait problem and joint swelling.  Skin: Negative for rash.  Neurological: Negative for dizziness, syncope, speech difficulty, weakness, numbness and headaches.  Hematological: Negative for adenopathy.  Psychiatric/Behavioral: Negative for agitation, behavioral problems and dysphoric mood. The patient is not nervous/anxious.        Objective:   Physical Exam  Constitutional: She is oriented to person, place, and time. She appears well-developed and well-nourished.  HENT:    Head: Normocephalic.  Right Ear: External ear normal.  Left Ear: External ear normal.  Mouth/Throat: Oropharynx is clear and moist.  Eyes: Pupils are equal, round, and reactive to light. Conjunctivae and EOM are normal.  Neck: Normal range of motion. Neck supple. No thyromegaly present.  Cardiovascular: Normal rate, regular rhythm, normal heart sounds and intact distal pulses.   Pulmonary/Chest: Effort normal and breath sounds normal.  Abdominal: Soft. Bowel sounds are normal. She exhibits no mass. There is no tenderness.  Musculoskeletal: Normal range of motion.  Lymphadenopathy:    She has no cervical adenopathy.  Neurological: She is alert and oriented to person, place, and time.  Skin: Skin is warm and dry. No rash noted.  Psychiatric: She has a normal mood and affect. Her behavior is normal.          Assessment & Plan:   Diabetes mellitus.  Well-controlled.  Annual eye examination recommended.  Follow-up 6 months Hypertension, stable Dyslipidemia.  Continue statin therapy  CPX 6 months No change in medical regimen All medications updated  Nyoka Cowden

## 2017-09-17 ENCOUNTER — Telehealth: Payer: Self-pay | Admitting: *Deleted

## 2017-09-17 NOTE — Telephone Encounter (Signed)
Patient called stating she needs albuterol to be refilled.

## 2017-09-18 MED ORDER — ALBUTEROL SULFATE HFA 108 (90 BASE) MCG/ACT IN AERS: 2.0000 | INHALATION_SPRAY | Freq: Four times a day (QID) | RESPIRATORY_TRACT | 5 refills | Status: DC | PRN

## 2017-09-18 NOTE — Telephone Encounter (Signed)
Medication was refilled.

## 2017-09-22 ENCOUNTER — Other Ambulatory Visit: Payer: Self-pay | Admitting: Internal Medicine

## 2017-10-11 DIAGNOSIS — S4981XA Other specified injuries of right shoulder and upper arm, initial encounter: Secondary | ICD-10-CM | POA: Diagnosis not present

## 2017-10-11 DIAGNOSIS — Z743 Need for continuous supervision: Secondary | ICD-10-CM | POA: Diagnosis not present

## 2017-10-11 DIAGNOSIS — M25511 Pain in right shoulder: Secondary | ICD-10-CM | POA: Diagnosis not present

## 2017-11-02 ENCOUNTER — Other Ambulatory Visit: Payer: Self-pay | Admitting: Internal Medicine

## 2017-11-30 ENCOUNTER — Other Ambulatory Visit: Payer: Self-pay | Admitting: Internal Medicine

## 2017-12-14 ENCOUNTER — Other Ambulatory Visit: Payer: Self-pay | Admitting: Internal Medicine

## 2017-12-25 ENCOUNTER — Other Ambulatory Visit: Payer: Self-pay | Admitting: Internal Medicine

## 2017-12-25 DIAGNOSIS — Z1231 Encounter for screening mammogram for malignant neoplasm of breast: Secondary | ICD-10-CM

## 2018-01-02 ENCOUNTER — Encounter: Payer: Self-pay | Admitting: Nurse Practitioner

## 2018-01-02 ENCOUNTER — Other Ambulatory Visit: Payer: Self-pay

## 2018-01-02 ENCOUNTER — Ambulatory Visit (INDEPENDENT_AMBULATORY_CARE_PROVIDER_SITE_OTHER): Payer: Medicare HMO | Admitting: Nurse Practitioner

## 2018-01-02 ENCOUNTER — Ambulatory Visit (HOSPITAL_COMMUNITY)
Admission: RE | Admit: 2018-01-02 | Discharge: 2018-01-02 | Disposition: A | Discharge status: Home / Self Care | Payer: Medicare HMO | Source: Ambulatory Visit | Attending: Nurse Practitioner | Admitting: Nurse Practitioner

## 2018-01-02 VITALS — BP 122/78 | HR 106 | Temp 97.8°F | Resp 14 | Ht 64.5 in | Wt 178.0 lb

## 2018-01-02 DIAGNOSIS — M542 Cervicalgia: Secondary | ICD-10-CM | POA: Diagnosis not present

## 2018-01-02 DIAGNOSIS — R0602 Shortness of breath: Secondary | ICD-10-CM

## 2018-01-02 DIAGNOSIS — Q2546 Tortuous aortic arch: Secondary | ICD-10-CM | POA: Insufficient documentation

## 2018-01-02 DIAGNOSIS — K219 Gastro-esophageal reflux disease without esophagitis: Secondary | ICD-10-CM | POA: Diagnosis not present

## 2018-01-02 MED ORDER — OMEPRAZOLE 20 MG PO CPDR: 20.0000 mg | DELAYED_RELEASE_CAPSULE | Freq: Every day | ORAL | 3 refills | Status: DC

## 2018-01-02 NOTE — Progress Notes (Signed)
Subjective:  Patient ID: Danielle Atkins, female    DOB: [birth year]  Age: 83 y.o. MRN: [id number]  CC: Sore Throat (x 2 weeks. sore throat at night, neck tension. Tried Nyquil and Dayquil)   Shortness of Breath  This is a new problem. The current episode started 1 to 4 weeks ago. The problem occurs intermittently. The problem has been waxing and waning. Associated symptoms include PND. Pertinent negatives include no abdominal pain, chest pain, claudication, coryza, ear pain, fever, headaches, hemoptysis, leg pain, leg swelling, neck pain, orthopnea, rash, rhinorrhea, sore throat, sputum production, swollen glands, syncope, vomiting or wheezing. The symptoms are aggravated by lying flat. Associated symptoms comments: Worse with laying supine. She has tried nothing for the symptoms. There is no history of allergies, asthma, chronic lung disease, COPD, DVT, a heart failure, pneumonia or a recent surgery.  Neck Pain   This is a new problem. The current episode started 1 to 4 weeks ago. The problem occurs intermittently. The problem has been waxing and waning. The pain is associated with nothing. The pain is present in the anterior neck. The quality of the pain is described as aching. Exacerbated by: supine position. Stiffness is present at night. Pertinent negatives include no chest pain, fever, headaches, leg pain, numbness, pain with swallowing, syncope, tingling, trouble swallowing, weakness or weight loss. She has tried acetaminophen for the symptoms. The treatment provided no relief.    Outpatient Medications Prior to Visit  Medication Sig Dispense Refill  . albuterol (PROAIR HFA) 108 (90 Base) MCG/ACT inhaler Inhale 2 puffs into the lungs every 6 (six) hours as needed for wheezing. 18 g 5  . allopurinol (ZYLOPRIM) 300 MG tablet TAKE 1 TABLET EVERY DAY 90 tablet 1  . aspirin 81 MG tablet Take 1 tablet (81 mg total) by mouth daily. 30 tablet   . glucose blood test strip 1 each by Other route daily as  needed for other. 100 each 12  . lisinopril (PRINIVIL,ZESTRIL) 10 MG tablet TAKE 1 TABLET (10 MG TOTAL) BY MOUTH DAILY. 90 tablet 3  . metFORMIN (GLUCOPHAGE) 500 MG tablet TAKE 1 TABLET EVERY DAY  WITH  BREAKFAST 90 tablet 1  . Multiple Vitamin (MULTIVITAMIN WITH MINERALS) TABS Take 1 tablet by mouth daily.    . simvastatin (ZOCOR) 40 MG tablet TAKE 1 TABLET EVERY DAY 90 tablet 1  . cimetidine (TAGAMET) 400 MG tablet TAKE 1 TABLET TWICE DAILY 180 tablet 1   No facility-administered medications prior to visit.     ROS See HPI  Objective:  BP 122/78   Pulse (!) 106   Temp 97.8 F (36.6 C) (Oral)   Resp 14   Ht 5' 4.5" (1.638 m)   Wt 178 lb (80.7 kg)   SpO2 97%   BMI 30.08 kg/m   BP Readings from Last 3 Encounters:  01/02/18 122/78  08/25/17 122/78  10/08/16 (!) 132/92    Wt Readings from Last 3 Encounters:  01/02/18 178 lb (80.7 kg)  08/25/17 181 lb 12.8 oz (82.5 kg)  10/08/16 189 lb 3.2 oz (85.8 kg)    Physical Exam  Constitutional: She is oriented to person, place, and time. No distress.  Neck: Normal range of motion. Neck supple. No tracheal deviation present. No thyromegaly present.  Cardiovascular: Normal rate and regular rhythm.  Pulmonary/Chest: Effort normal and breath sounds normal. No stridor.  Musculoskeletal: She exhibits no edema or tenderness.       Right shoulder: Normal.  Left shoulder: Normal.       Cervical back: Normal.       Thoracic back: Normal.  Lymphadenopathy:    She has no cervical adenopathy.  Neurological: She is alert and oriented to person, place, and time.  Skin: Skin is warm and dry.  Psychiatric: She has a normal mood and affect.  Vitals reviewed.   Lab Results  Component Value Date   WBC 8.1 10/01/2016   HGB 11.7 (L) 10/01/2016   HCT 37.2 10/01/2016   PLT 261.0 10/01/2016   GLUCOSE 101 (H) 10/01/2016   CHOL 138 10/01/2016   TRIG 101.0 10/01/2016   HDL 48.90 10/01/2016   LDLCALC 69 10/01/2016   ALT 14 10/01/2016    AST 20 10/01/2016   NA 140 10/01/2016   K 4.0 10/01/2016   CL 105 10/01/2016   CREATININE 1.64 (H) 10/01/2016   BUN 18 10/01/2016   CO2 27 10/01/2016   TSH 5.43 (H) 10/01/2016   HGBA1C 5.8 08/25/2017   MICROALBUR 5.9 (H) 10/01/2016    Mm Screening Breast Tomo Bilateral  Result Date: 01/28/2017 CLINICAL DATA:  Screening. EXAM: 2D DIGITAL SCREENING BILATERAL MAMMOGRAM WITH CAD AND ADJUNCT TOMO COMPARISON:  Previous exam(s). ACR Breast Density Category b: There are scattered areas of fibroglandular density. FINDINGS: There are no findings suspicious for malignancy. Images were processed with CAD. IMPRESSION: No mammographic evidence of malignancy. A result letter of this screening mammogram will be mailed directly to the patient. RECOMMENDATION: Screening mammogram in one year. (Code:SM-B-01Y) BI-RADS CATEGORY  1: Negative. Electronically Signed   By: Lajean Manes M.D.   On: 01/28/2017 07:44    Assessment & Plan:   Danielle Atkins was seen today for sore throat.  Diagnoses and all orders for this visit:  SOB (shortness of breath) -     DG Chest 2 View; Future  Neck pain -     US Soft Tissue Head/Neck; Future -     Cancel: TSH; Future  Gastroesophageal reflux disease, esophagitis presence not specified -     omeprazole (PRILOSEC) 20 MG capsule; Take 1 capsule (20 mg total) by mouth daily.   I have discontinued Danielle Atkins's cimetidine. I am also having her start on omeprazole. Additionally, I am having her maintain her multivitamin with minerals, glucose blood, aspirin, metFORMIN, albuterol, lisinopril, simvastatin, and allopurinol.  Meds ordered this encounter  Medications  . omeprazole (PRILOSEC) 20 MG capsule    Sig: Take 1 capsule (20 mg total) by mouth daily.    Dispense:  30 capsule    Refill:  3    Order Specific Question:   Supervising Provider    Answer:   Tammi Sou [[id number]]    Follow-up: No Follow-up on file.  Wilfred Lacy, NP

## 2018-01-02 NOTE — Patient Instructions (Addendum)
You may use tylenol 500mg  every 8hrs as needed.  Discussed CXR results with patient on phone. Advised to discontinue cimetidine, start omeprazole. Prescription sent. You will be contacted to schedule US soft tissue/neck.  Please follow up with pcp to evaluate for sleep apnea and aortic artherosclerosis.

## 2018-01-04 ENCOUNTER — Encounter: Payer: Self-pay | Admitting: Internal Medicine

## 2018-01-04 ENCOUNTER — Ambulatory Visit (INDEPENDENT_AMBULATORY_CARE_PROVIDER_SITE_OTHER): Payer: Medicare HMO | Admitting: Internal Medicine

## 2018-01-04 VITALS — BP 134/70 | HR 96 | Temp 97.2°F | Ht 64.0 in | Wt 180.6 lb

## 2018-01-04 DIAGNOSIS — M542 Cervicalgia: Secondary | ICD-10-CM | POA: Diagnosis not present

## 2018-01-04 DIAGNOSIS — E1121 Type 2 diabetes mellitus with diabetic nephropathy: Secondary | ICD-10-CM

## 2018-01-04 DIAGNOSIS — I10 Essential (primary) hypertension: Secondary | ICD-10-CM | POA: Diagnosis not present

## 2018-01-04 NOTE — Progress Notes (Signed)
Subjective:    Patient ID: Danielle Atkins, female    DOB: Apr 21, 1935, 83 y.o.   MRN: [id number]  HPI  Lab Results  Component Value Date   HGBA1C 5.8 08/25/2017   83 year old patient who is seen today in follow-up.  She was seen at the [facility] clinic 2 days ago with a chief complaint of neck pain.  This has greatly improved and essentially has normalized She had some URI symptoms and a chest x-ray was performed that was normal. An ultrasound of the neck was also ordered presumably due to the neck pain.  The chest x-ray did reveal some arthritic changes in the spine otherwise normal Today she feels quite well  Omeprazole was substituted for Tagamet  Past Medical History:  Diagnosis Date  . ANEMIA-NOS 06/25/2007  . GOUT 11/16/2009  . PARESTHESIA, HANDS 03/27/2009  . PEPTIC ULCER DISEASE 06/25/2007  . RENAL INSUFFICIENCY 06/25/2007  . TRANSIENT ISCHEMIC ATTACK, HX OF 06/25/2007     Social History   Socioeconomic History  . Marital status: Widowed    Spouse name: Not on file  . Number of children: Not on file  . Years of education: Not on file  . Highest education level: Not on file  Social Needs  . Financial resource strain: Not on file  . Food insecurity - worry: Not on file  . Food insecurity - inability: Not on file  . Transportation needs - medical: Not on file  . Transportation needs - non-medical: Not on file  Occupational History  . Not on file  Tobacco Use  . Smoking status: Former Smoker    Last attempt to quit: 12/16/1999    Years since quitting: 18.0  . Smokeless tobacco: Never Used  Substance and Sexual Activity  . Alcohol use: No  . Drug use: No  . Sexual activity: Not on file  Other Topics Concern  . Not on file  Social History Narrative  . Not on file    History reviewed. No pertinent surgical history.  History reviewed. No pertinent family history.  No Known Allergies  Current Outpatient Medications on File Prior to Visit  Medication Sig Dispense  Refill  . albuterol (PROAIR HFA) 108 (90 Base) MCG/ACT inhaler Inhale 2 puffs into the lungs every 6 (six) hours as needed for wheezing. 18 g 5  . allopurinol (ZYLOPRIM) 300 MG tablet TAKE 1 TABLET EVERY DAY 90 tablet 1  . aspirin 81 MG tablet Take 1 tablet (81 mg total) by mouth daily. 30 tablet   . glucose blood test strip 1 each by Other route daily as needed for other. 100 each 12  . lisinopril (PRINIVIL,ZESTRIL) 10 MG tablet TAKE 1 TABLET (10 MG TOTAL) BY MOUTH DAILY. 90 tablet 3  . metFORMIN (GLUCOPHAGE) 500 MG tablet TAKE 1 TABLET EVERY DAY  WITH  BREAKFAST 90 tablet 1  . Multiple Vitamin (MULTIVITAMIN WITH MINERALS) TABS Take 1 tablet by mouth daily.    Marland Kitchen omeprazole (PRILOSEC) 20 MG capsule Take 1 capsule (20 mg total) by mouth daily. 30 capsule 3  . simvastatin (ZOCOR) 40 MG tablet TAKE 1 TABLET EVERY DAY 90 tablet 1   No current facility-administered medications on file prior to visit.     BP 134/70 (BP Location: Left Arm, Patient Position: Sitting, Cuff Size: Normal)   Pulse 96   Temp (!) 97.2 F (36.2 C) (Oral)   Ht 5\' 4"  (1.626 m)   Wt 180 lb 9.6 oz (81.9 kg)   SpO2 96%  BMI 31.00 kg/m     Review of Systems  Constitutional: Negative.   HENT: Negative for congestion, dental problem, hearing loss, rhinorrhea, sinus pressure, sore throat and tinnitus.   Eyes: Negative for pain, discharge and visual disturbance.  Respiratory: Negative for cough and shortness of breath.   Cardiovascular: Negative for chest pain, palpitations and leg swelling.  Gastrointestinal: Negative for abdominal distention, abdominal pain, blood in stool, constipation, diarrhea, nausea and vomiting.  Genitourinary: Negative for difficulty urinating, dysuria, flank pain, frequency, hematuria, pelvic pain, urgency, vaginal bleeding, vaginal discharge and vaginal pain.  Musculoskeletal: Positive for neck pain and neck stiffness. Negative for arthralgias, gait problem and joint swelling.  Skin: Negative  for rash.  Neurological: Negative for dizziness, syncope, speech difficulty, weakness, numbness and headaches.  Hematological: Negative for adenopathy.  Psychiatric/Behavioral: Negative for agitation, behavioral problems and dysphoric mood. The patient is not nervous/anxious.        Objective:   Physical Exam  Constitutional: She is oriented to person, place, and time. She appears well-developed and well-nourished.  HENT:  Head: Normocephalic.  Right Ear: External ear normal.  Left Ear: External ear normal.  Mouth/Throat: Oropharynx is clear and moist.  Eyes: Conjunctivae and EOM are normal. Pupils are equal, round, and reactive to light.  Neck: Normal range of motion. Neck supple. No thyromegaly present.  Full range of motion of the neck  Cardiovascular: Normal rate, regular rhythm, normal heart sounds and intact distal pulses.  Pulmonary/Chest: Effort normal and breath sounds normal.  Abdominal: Soft. Bowel sounds are normal. She exhibits no mass. There is no tenderness.  Musculoskeletal: Normal range of motion.  Lymphadenopathy:    She has no cervical adenopathy.  Neurological: She is alert and oriented to person, place, and time.  Skin: Skin is warm and dry. No rash noted.  Psychiatric: She has a normal mood and affect. Her behavior is normal.          Assessment & Plan:   Neck pain.  Resolved Status post URI Diabetes mellitus stable.  Patient has her annual exam scheduled in 2 months.  Will check hemoglobin A1c at that time Essential hypertension stable  Remote peptic ulcer disease.  Patient will resume Tagamet after present supply of omeprazole depleted  CPX as scheduled  Nyoka Cowden

## 2018-01-04 NOTE — Patient Instructions (Signed)
Return as scheduled for your annual exam  Call or return to clinic prn if these symptoms worsen or fail to improve as anticipated.

## 2018-01-29 ENCOUNTER — Ambulatory Visit
Admission: RE | Admit: 2018-01-29 | Discharge: 2018-01-29 | Disposition: A | Discharge status: Home / Self Care | Payer: Medicare HMO | Source: Ambulatory Visit | Attending: Internal Medicine | Admitting: Internal Medicine

## 2018-01-29 DIAGNOSIS — Z1231 Encounter for screening mammogram for malignant neoplasm of breast: Secondary | ICD-10-CM

## 2018-02-01 ENCOUNTER — Other Ambulatory Visit: Payer: Self-pay | Admitting: Internal Medicine

## 2018-02-01 DIAGNOSIS — R928 Other abnormal and inconclusive findings on diagnostic imaging of breast: Secondary | ICD-10-CM

## 2018-02-03 ENCOUNTER — Other Ambulatory Visit: Payer: Medicare HMO

## 2018-02-08 ENCOUNTER — Other Ambulatory Visit: Payer: Self-pay | Admitting: Internal Medicine

## 2018-02-08 ENCOUNTER — Telehealth: Payer: Self-pay | Admitting: Internal Medicine

## 2018-02-08 ENCOUNTER — Ambulatory Visit
Admission: RE | Admit: 2018-02-08 | Discharge: 2018-02-08 | Disposition: A | Discharge status: Home / Self Care | Payer: Medicare HMO | Source: Ambulatory Visit | Attending: Internal Medicine | Admitting: Internal Medicine

## 2018-02-08 DIAGNOSIS — R928 Other abnormal and inconclusive findings on diagnostic imaging of breast: Secondary | ICD-10-CM

## 2018-02-08 DIAGNOSIS — N6489 Other specified disorders of breast: Secondary | ICD-10-CM | POA: Diagnosis not present

## 2018-02-08 DIAGNOSIS — R599 Enlarged lymph nodes, unspecified: Secondary | ICD-10-CM

## 2018-02-08 NOTE — Telephone Encounter (Signed)
Copied from [place] [id number]. Topic: Quick Communication - See Telephone Encounter >> Feb 08, 2018  2:23 PM [name], NT wrote: CRM for notification. See Telephone encounter for: Patient called and wanted to see if the nurse could give her a call back regarding her upcoming appointment for a mammogram.   02/08/18.

## 2018-02-10 ENCOUNTER — Other Ambulatory Visit: Payer: Self-pay | Admitting: Internal Medicine

## 2018-02-10 ENCOUNTER — Ambulatory Visit
Admission: RE | Admit: 2018-02-10 | Discharge: 2018-02-10 | Disposition: A | Discharge status: Home / Self Care | Payer: Medicare HMO | Source: Ambulatory Visit | Attending: Internal Medicine | Admitting: Internal Medicine

## 2018-02-10 ENCOUNTER — Other Ambulatory Visit (HOSPITAL_COMMUNITY): Admission: RE | Admit: 2018-02-10 | Payer: Medicare HMO | Source: Ambulatory Visit | Admitting: Internal Medicine

## 2018-02-10 DIAGNOSIS — R590 Localized enlarged lymph nodes: Secondary | ICD-10-CM | POA: Diagnosis not present

## 2018-02-10 DIAGNOSIS — R599 Enlarged lymph nodes, unspecified: Secondary | ICD-10-CM

## 2018-02-10 NOTE — Telephone Encounter (Signed)
Spoke with patient and she said her daughter was more concerned and wanted the patient to call Dr.Kwiatkowski due to polyp removals in breast. Otherwise patient is find no other action needed.

## 2018-02-24 ENCOUNTER — Encounter: Payer: Self-pay | Admitting: Internal Medicine

## 2018-02-24 ENCOUNTER — Ambulatory Visit (INDEPENDENT_AMBULATORY_CARE_PROVIDER_SITE_OTHER): Payer: Medicare HMO | Admitting: Internal Medicine

## 2018-02-24 VITALS — BP 138/62 | HR 110 | Wt 179.0 lb

## 2018-02-24 DIAGNOSIS — E7800 Pure hypercholesterolemia, unspecified: Secondary | ICD-10-CM

## 2018-02-24 DIAGNOSIS — E0821 Diabetes mellitus due to underlying condition with diabetic nephropathy: Secondary | ICD-10-CM

## 2018-02-24 DIAGNOSIS — N183 Chronic kidney disease, stage 3 unspecified: Secondary | ICD-10-CM

## 2018-02-24 DIAGNOSIS — E1122 Type 2 diabetes mellitus with diabetic chronic kidney disease: Secondary | ICD-10-CM

## 2018-02-24 DIAGNOSIS — D631 Anemia in chronic kidney disease: Secondary | ICD-10-CM | POA: Diagnosis not present

## 2018-02-24 DIAGNOSIS — I10 Essential (primary) hypertension: Secondary | ICD-10-CM | POA: Diagnosis not present

## 2018-02-24 DIAGNOSIS — Z0000 Encounter for general adult medical examination without abnormal findings: Secondary | ICD-10-CM

## 2018-02-24 LAB — HEMOGLOBIN A1C: Hgb A1c MFr Bld: 6.2 % (ref 4.6–6.5)

## 2018-02-24 LAB — CBC WITH DIFFERENTIAL/PLATELET
BASOS PCT: 0.7 % (ref 0.0–3.0)
Basophils Absolute: 0.1 10*3/uL (ref 0.0–0.1)
EOS PCT: 4.1 % (ref 0.0–5.0)
Eosinophils Absolute: 0.3 10*3/uL (ref 0.0–0.7)
HEMATOCRIT: 36 % (ref 36.0–46.0)
HEMOGLOBIN: 11.1 g/dL — AB (ref 12.0–15.0)
LYMPHS PCT: 31.9 % (ref 12.0–46.0)
Lymphs Abs: 2.4 10*3/uL (ref 0.7–4.0)
MCHC: 30.9 g/dL (ref 30.0–36.0)
MONOS PCT: 6.7 % (ref 3.0–12.0)
Monocytes Absolute: 0.5 10*3/uL (ref 0.1–1.0)
Neutro Abs: 4.3 10*3/uL (ref 1.4–7.7)
Neutrophils Relative %: 56.6 % (ref 43.0–77.0)
Platelets: 262 10*3/uL (ref 150.0–400.0)
RBC: 5.15 Mil/uL — ABNORMAL HIGH (ref 3.87–5.11)
RDW: 19.2 % — AB (ref 11.5–15.5)
WBC: 7.7 10*3/uL (ref 4.0–10.5)

## 2018-02-24 LAB — COMPREHENSIVE METABOLIC PANEL
ALT: 11 U/L (ref 0–35)
AST: 15 U/L (ref 0–37)
Albumin: 4 g/dL (ref 3.5–5.2)
Alkaline Phosphatase: 53 U/L (ref 39–117)
BUN: 20 mg/dL (ref 6–23)
CALCIUM: 9.8 mg/dL (ref 8.4–10.5)
CHLORIDE: 103 meq/L (ref 96–112)
CO2: 27 meq/L (ref 19–32)
Creatinine, Ser: 1.65 mg/dL — ABNORMAL HIGH (ref 0.40–1.20)
GFR: 38.39 mL/min — AB (ref >60.00)
GLUCOSE: 89 mg/dL (ref 70–99)
Potassium: 4.1 mEq/L (ref 3.5–5.1)
Sodium: 139 mEq/L (ref 135–145)
Total Bilirubin: 0.7 mg/dL (ref 0.2–1.2)
Total Protein: 7.4 g/dL (ref 6.0–8.3)

## 2018-02-24 LAB — TSH: TSH: 5.47 u[IU]/mL — ABNORMAL HIGH (ref 0.35–4.50)

## 2018-02-24 LAB — LIPID PANEL
CHOL/HDL RATIO: 3
Cholesterol: 130 mg/dL (ref 0–200)
HDL: 46.7 mg/dL (ref >39.00)
LDL CALC: 60 mg/dL (ref 0–99)
NONHDL: 83
TRIGLYCERIDES: 116 mg/dL (ref 0.0–149.0)
VLDL: 23.2 mg/dL (ref 0.0–40.0)

## 2018-02-24 NOTE — Progress Notes (Addendum)
Subjective:    Patient ID: Danielle Atkins, female    DOB: 1935-12-08, 83 y.o.   MRN: [id number]  HPI  83 year old patient who is seen today for an annual examination and subsequent Medicare wellness visit She continues to do quite well.  She has had a recent breast biopsy with benign pathology. She has a history of type 2 diabetes as well as stable chronic renal insufficiency.  She remains on statin therapy for dyslipidemia. Denies any cardiopulmonary complaints she has had a prior history of a TIA.  She has treated hypertension which has been under good control.  Allergies:  No Known Drug Allergies   Past History:  Past Medical History:   Diabetes mellitus, type II  Hyperlipidemia  Hypertension  Anemia-NOS  Peptic ulcer disease  Renal insufficiency  RBBB and left anterior hemiblock  Transient ischemic attack, hx of  history of anemia, secondary to thalassemia trait  history of left brain TIA 2005   Past Surgical History:   Appendectomy 83  Hysterectomy 83  Tonsillectomy 83  colonoscopy 2003 or 2004   Family History:    father died at age 81 of the prostate cancer. Mother died in her late 77s of renal cancer  Two brothers, positive for diabetes, and hypertension  Family History Diabetes 1st degree relative  Family History Kidney disease  Family History of Prostate CA 1st degree relative <50   Social History:  Marland Kitchen  Married  Regular exercise-yes  Does Patient Exercise: yes  Subsequent Medicare wellness visit  1. Risk factors, based on past  M,S,F history- and cardiovascular risk factors include hypertension dyslipidemia and type 2 diabetes. She also has a history of prior TIA.    2.  Physical activities:Remains fairly active does walk frequently. No exercise limitation; participates in [employer] sne[employer] program 5 times per week   3.  Depression/mood:No history depression or mood disorder   4.  Hearing:No deficits   5.  ADL's:Independent in all aspects  of daily living   6.  Fall risk:Low   7.  Home safety:No problems identified   8.  Height weight, and visual acuity; height and weight stable. No visual dysfunction.  Has an annual eye examination with Dr.  Katy Fitch  9.  Counseling:Weight loss regular exercise restricted sodium diet all encouraged   10. Lab orders based on risk factors: Laboratory profile will be reviewed including hemoglobin A1c and urine for microalbumin   11.   Referral follow-up ophthalmology 12. Care plan: We'll continue efforts at aggressive risk factor modification  13. Cognitive assessment: Alert and oriented with normal affect no cognitive dysfunction.  14.  Preventive services will include annual clinical examinations with screening lab.  She will continue to have annual mammograms and annual eye examinations.  15.  Provider list includes primary care ophthalmology and radiology      Review of Systems  Constitutional: Negative.   HENT: Positive for postnasal drip and rhinorrhea. Negative for congestion, dental problem, hearing loss, sinus pressure, sore throat and tinnitus.   Eyes: Negative for pain, discharge and visual disturbance.  Respiratory: Positive for wheezing. Negative for cough and shortness of breath.   Cardiovascular: Negative for chest pain, palpitations and leg swelling.  Gastrointestinal: Negative for abdominal distention, abdominal pain, blood in stool, constipation, diarrhea, nausea and vomiting.  Genitourinary: Negative for difficulty urinating, dysuria, flank pain, frequency, hematuria, pelvic pain, urgency, vaginal bleeding, vaginal discharge and vaginal pain.  Musculoskeletal: Negative for arthralgias, gait problem and joint swelling.  Skin: Negative for  rash.  Neurological: Negative for dizziness, syncope, speech difficulty, weakness, numbness and headaches.  Hematological: Negative for adenopathy.  Psychiatric/Behavioral: Negative for agitation, behavioral problems and  dysphoric mood. The patient is not nervous/anxious.        Objective:   Physical Exam  Constitutional: She is oriented to person, place, and time. She appears well-developed and well-nourished.  HENT:  Head: Normocephalic.  Right Ear: External ear normal.  Left Ear: External ear normal.  Mouth/Throat: Oropharynx is clear and moist.  Eyes: Conjunctivae and EOM are normal. Pupils are equal, round, and reactive to light.  Neck: Normal range of motion. Neck supple. No thyromegaly present.  Cardiovascular: Normal rate, regular rhythm, normal heart sounds and intact distal pulses.  Pulmonary/Chest: Effort normal and breath sounds normal.  Abdominal: Soft. Bowel sounds are normal. She exhibits no mass. There is no tenderness.  Musculoskeletal: Normal range of motion.  Lymphadenopathy:    She has no cervical adenopathy.  Neurological: She is alert and oriented to person, place, and time.  Skin: Skin is warm and dry. No rash noted.  Psychiatric: She has a normal mood and affect. Her behavior is normal.          Assessment & Plan:  Preventive health exam Subsequent Medicare wellness visit  Diabetes mellitus.  Will review a hemoglobin A1c urine for microalbumin Diabetes mellitus with stage III chronic kidney disease.  Will review renal indices Dyslipidemia.  Will check a lipid profile Essential hypertension well-controlled Obesity weight loss encouraged Gastroesophageal reflux disease.  Better antireflux diet encouraged and moderate use of PPI therapy  Follow-up 6 months  KWIATKOWSKI,PETER Pilar Plate

## 2018-02-24 NOTE — Patient Instructions (Signed)
Limit your sodium (Salt) intake   Please check your hemoglobin A1c every 3-6  Months    It is important that you exercise regularly, at least 20 minutes 3 to 4 times per week.  If you develop chest pain or shortness of breath seek  medical attention.  Return in 6 months for follow-up

## 2018-02-25 MED ORDER — LEVOTHYROXINE SODIUM 25 MCG PO TABS: 25.0000 ug | ORAL_TABLET | Freq: Every day | ORAL | 0 refills | Status: DC

## 2018-02-25 NOTE — Addendum Note (Signed)
Addended by: Gwenyth Ober R on: 02/25/2018 09:05 AM   Modules accepted: Orders

## 2018-03-08 ENCOUNTER — Other Ambulatory Visit: Payer: Self-pay | Admitting: Internal Medicine

## 2018-04-28 ENCOUNTER — Other Ambulatory Visit: Payer: Self-pay | Admitting: Internal Medicine

## 2018-04-28 DIAGNOSIS — H40013 Open angle with borderline findings, low risk, bilateral: Secondary | ICD-10-CM | POA: Diagnosis not present

## 2018-04-28 DIAGNOSIS — Z961 Presence of intraocular lens: Secondary | ICD-10-CM | POA: Diagnosis not present

## 2018-04-28 DIAGNOSIS — H02825 Cysts of left lower eyelid: Secondary | ICD-10-CM | POA: Diagnosis not present

## 2018-04-28 DIAGNOSIS — H04123 Dry eye syndrome of bilateral lacrimal glands: Secondary | ICD-10-CM | POA: Diagnosis not present

## 2018-04-28 DIAGNOSIS — E119 Type 2 diabetes mellitus without complications: Secondary | ICD-10-CM | POA: Diagnosis not present

## 2018-04-28 DIAGNOSIS — H3561 Retinal hemorrhage, right eye: Secondary | ICD-10-CM | POA: Diagnosis not present

## 2018-04-28 LAB — HM DIABETES EYE EXAM

## 2018-04-30 ENCOUNTER — Other Ambulatory Visit: Payer: Self-pay | Admitting: Internal Medicine

## 2018-04-30 NOTE — Telephone Encounter (Signed)
Patient was on Tagamet in the past and she would like a refill for that. See note 01/04/18.  LOV: 02/24/18   PCP: [place]: verified

## 2018-04-30 NOTE — Telephone Encounter (Unsigned)
Copied from [place] [id number]. Topic: Quick Communication - See Telephone Encounter >> Apr 30, 2018 12:26 PM Hewitt Shorts wrote: Pt is needing a refill on cimetidine it is not on patient list of meds but she is sure that Dr. Raliegh Ip has given this to her before   [facility] [address] [phone]  Best number [phone]

## 2018-05-03 NOTE — Telephone Encounter (Signed)
Patient is calling to check on the status of this. She states she goes out of town tomorrow 05/04/18.   [facility] [address]

## 2018-05-04 NOTE — Telephone Encounter (Signed)
Okay for refill #90 refill x2

## 2018-05-04 NOTE — Telephone Encounter (Signed)
Okay to refill? 

## 2018-05-05 ENCOUNTER — Other Ambulatory Visit: Payer: Self-pay

## 2018-05-05 MED ORDER — CIMETIDINE 300 MG PO TABS: 300.0000 mg | ORAL_TABLET | Freq: Four times a day (QID) | ORAL | 0 refills | Status: DC

## 2018-05-05 NOTE — Telephone Encounter (Signed)
Noted! rx sent in

## 2018-05-05 NOTE — Telephone Encounter (Signed)
Generic Tagamet 400 mg #60 one twice daily as needed

## 2018-05-05 NOTE — Telephone Encounter (Signed)
Strength and dose instructions please!

## 2018-05-22 ENCOUNTER — Other Ambulatory Visit: Payer: Self-pay | Admitting: Internal Medicine

## 2018-05-31 ENCOUNTER — Telehealth: Payer: Self-pay | Admitting: Internal Medicine

## 2018-05-31 MED ORDER — CIMETIDINE 300 MG PO TABS: 300.0000 mg | ORAL_TABLET | Freq: Four times a day (QID) | ORAL | 0 refills | Status: DC

## 2018-05-31 NOTE — Telephone Encounter (Signed)
Copied from [place] [id number]. Topic: General - Other >> May 31, 2018  9:28 AM Oneta Rack wrote: Relation to pt: self  Call back [place]: [id number] [place] [place] [phone] (Phone) [phone] (Fax)   Reason for call:  Patient states mail order informed her cimetidine (TAGAMET) 300 MG tablet is on back order and will not be in stock until the end of the month. [facility]. Informed patient please allow 48 to 72 hour turn around time, patient voice understanding and stated she will be going out of town on Wednesday, please advise

## 2018-06-02 ENCOUNTER — Other Ambulatory Visit: Payer: Self-pay | Admitting: Internal Medicine

## 2018-07-29 ENCOUNTER — Other Ambulatory Visit: Payer: Self-pay | Admitting: Internal Medicine

## 2018-08-24 ENCOUNTER — Other Ambulatory Visit: Payer: Self-pay | Admitting: Internal Medicine

## 2018-08-30 ENCOUNTER — Encounter: Payer: Self-pay | Admitting: Internal Medicine

## 2018-08-30 ENCOUNTER — Ambulatory Visit (INDEPENDENT_AMBULATORY_CARE_PROVIDER_SITE_OTHER): Payer: Medicare HMO | Admitting: Internal Medicine

## 2018-08-30 VITALS — BP 110/60 | HR 95 | Temp 98.0°F | Wt 177.2 lb

## 2018-08-30 DIAGNOSIS — Z23 Encounter for immunization: Secondary | ICD-10-CM | POA: Diagnosis not present

## 2018-08-30 DIAGNOSIS — E7800 Pure hypercholesterolemia, unspecified: Secondary | ICD-10-CM | POA: Diagnosis not present

## 2018-08-30 DIAGNOSIS — E119 Type 2 diabetes mellitus without complications: Secondary | ICD-10-CM

## 2018-08-30 DIAGNOSIS — I10 Essential (primary) hypertension: Secondary | ICD-10-CM

## 2018-08-30 DIAGNOSIS — E1169 Type 2 diabetes mellitus with other specified complication: Secondary | ICD-10-CM | POA: Diagnosis not present

## 2018-08-30 DIAGNOSIS — E0821 Diabetes mellitus due to underlying condition with diabetic nephropathy: Secondary | ICD-10-CM

## 2018-08-30 DIAGNOSIS — E1121 Type 2 diabetes mellitus with diabetic nephropathy: Secondary | ICD-10-CM | POA: Diagnosis not present

## 2018-08-30 LAB — POCT GLYCOSYLATED HEMOGLOBIN (HGB A1C): Hemoglobin A1C: 5.2 % (ref 4.0–5.6)

## 2018-08-30 MED ORDER — LEVOTHYROXINE SODIUM 50 MCG PO TABS: 25.0000 ug | ORAL_TABLET | Freq: Every day | ORAL | 4 refills | Status: DC

## 2018-08-30 MED ORDER — METFORMIN HCL 500 MG PO TABS: ORAL_TABLET | ORAL | 4 refills | Status: DC

## 2018-08-30 MED ORDER — FAMOTIDINE 40 MG PO TABS
40.0000 mg | ORAL_TABLET | Freq: Every day | ORAL | 4 refills | Status: DC
Start: 2018-08-30 — End: 2019-03-01

## 2018-08-30 MED ORDER — LISINOPRIL 10 MG PO TABS: 10.0000 mg | ORAL_TABLET | Freq: Every day | ORAL | 3 refills | Status: DC

## 2018-08-30 MED ORDER — SIMVASTATIN 40 MG PO TABS: 40.0000 mg | ORAL_TABLET | Freq: Every day | ORAL | 4 refills | Status: DC

## 2018-08-30 MED ORDER — ALLOPURINOL 300 MG PO TABS: 300.0000 mg | ORAL_TABLET | Freq: Every day | ORAL | 1 refills | Status: DC

## 2018-08-30 NOTE — Progress Notes (Signed)
Subjective:    Patient ID: Danielle Atkins, female    DOB: 05/06/1935, 83 y.o.   MRN: [id number]  HPI  83 year old patient who is seen today for biannual follow-up. She has a history of type 2 diabetes.  Was seen by ophthalmology about 2 months ago She has dyslipidemia and a history of essential hypertension.  In general doing quite well.  No cardiopulmonary complaints. She states that she is having a difficult time obtaining cimetidine.  Lab Results  Component Value Date   HGBA1C 5.2 08/30/2018    BP Readings from Last 3 Encounters:  08/30/18 110/60  02/24/18 138/62  01/04/18 134/70    Past Medical History:  Diagnosis Date  . ANEMIA-NOS 06/25/2007  . GOUT 11/16/2009  . PARESTHESIA, HANDS 03/27/2009  . PEPTIC ULCER DISEASE 06/25/2007  . RENAL INSUFFICIENCY 06/25/2007  . TRANSIENT ISCHEMIC ATTACK, HX OF 06/25/2007     Social History   Socioeconomic History  . Marital status: Widowed    Spouse name: Not on file  . Number of children: Not on file  . Years of education: Not on file  . Highest education level: Not on file  Occupational History  . Not on file  Social Needs  . Financial resource strain: Not on file  . Food insecurity:    Worry: Not on file    Inability: Not on file  . Transportation needs:    Medical: Not on file    Non-medical: Not on file  Tobacco Use  . Smoking status: Former Smoker    Last attempt to quit: 12/16/1999    Years since quitting: 18.7  . Smokeless tobacco: Never Used  Substance and Sexual Activity  . Alcohol use: No  . Drug use: No  . Sexual activity: Not on file  Lifestyle  . Physical activity:    Days per week: Not on file    Minutes per session: Not on file  . Stress: Not on file  Relationships  . Social connections:    Talks on phone: Not on file    Gets together: Not on file    Attends religious service: Not on file    Active member of club or organization: Not on file    Attends meetings of clubs or organizations: Not on  file    Relationship status: Not on file  . Intimate partner violence:    Fear of current or ex partner: Not on file    Emotionally abused: Not on file    Physically abused: Not on file    Forced sexual activity: Not on file  Other Topics Concern  . Not on file  Social History Narrative  . Not on file    History reviewed. No pertinent surgical history.  History reviewed. No pertinent family history.  No Known Allergies  Current Outpatient Medications on File Prior to Visit  Medication Sig Dispense Refill  . albuterol (PROAIR HFA) 108 (90 Base) MCG/ACT inhaler Inhale 2 puffs into the lungs every 6 (six) hours as needed for wheezing. 18 g 5  . allopurinol (ZYLOPRIM) 300 MG tablet TAKE 1 TABLET EVERY DAY 90 tablet 1  . aspirin 81 MG tablet Take 1 tablet (81 mg total) by mouth daily. 30 tablet   . cimetidine (TAGAMET) 300 MG tablet Take 1 tablet (300 mg total) by mouth 4 (four) times daily. 60 tablet 0  . glucose blood test strip 1 each by Other route daily as needed for other. 100 each 12  . levothyroxine (SYNTHROID,  LEVOTHROID) 25 MCG tablet TAKE 1 TABLET (25 MCG TOTAL) BY MOUTH DAILY BEFORE BREAKFAST. 90 tablet 0  . lisinopril (PRINIVIL,ZESTRIL) 10 MG tablet TAKE 1 TABLET EVERY DAY 90 tablet 3  . metFORMIN (GLUCOPHAGE) 500 MG tablet TAKE 1 TABLET EVERY DAY  WITH  BREAKFAST 90 tablet 1  . Multiple Vitamin (MULTIVITAMIN WITH MINERALS) TABS Take 1 tablet by mouth daily.    . simvastatin (ZOCOR) 40 MG tablet TAKE 1 TABLET EVERY DAY 90 tablet 1   No current facility-administered medications on file prior to visit.     BP 110/60 (BP Location: Right Arm, Patient Position: Sitting, Cuff Size: Large)   Pulse 95   Temp 98 F (36.7 C) (Oral)   Wt 177 lb 3.2 oz (80.4 kg)   SpO2 96%   BMI 30.42 kg/m     Review of Systems  Constitutional: Negative.   HENT: Negative for congestion, dental problem, hearing loss, rhinorrhea, sinus pressure, sore throat and tinnitus.   Eyes: Negative  for pain, discharge and visual disturbance.  Respiratory: Negative for cough and shortness of breath.   Cardiovascular: Negative for chest pain, palpitations and leg swelling.  Gastrointestinal: Negative for abdominal distention, abdominal pain, blood in stool, constipation, diarrhea, nausea and vomiting.  Genitourinary: Negative for difficulty urinating, dysuria, flank pain, frequency, hematuria, pelvic pain, urgency, vaginal bleeding, vaginal discharge and vaginal pain.  Musculoskeletal: Negative for arthralgias, gait problem and joint swelling.  Skin: Negative for rash.  Neurological: Negative for dizziness, syncope, speech difficulty, weakness, numbness and headaches.  Hematological: Negative for adenopathy.  Psychiatric/Behavioral: Negative for agitation, behavioral problems and dysphoric mood. The patient is not nervous/anxious.        Objective:   Physical Exam  Constitutional: She is oriented to person, place, and time. She appears well-developed and well-nourished.  HENT:  Head: Normocephalic.  Right Ear: External ear normal.  Left Ear: External ear normal.  Mouth/Throat: Oropharynx is clear and moist.  Eyes: Pupils are equal, round, and reactive to light. Conjunctivae and EOM are normal.  Neck: Normal range of motion. Neck supple. No thyromegaly present.  Cardiovascular: Normal rate, regular rhythm, normal heart sounds and intact distal pulses.  Pulmonary/Chest: Effort normal and breath sounds normal.  Abdominal: Soft. Bowel sounds are normal. She exhibits no mass. There is no tenderness.  Musculoskeletal: Normal range of motion.  Lymphadenopathy:    She has no cervical adenopathy.  Neurological: She is alert and oriented to person, place, and time.  Skin: Skin is warm and dry. No rash noted.  Psychiatric: She has a normal mood and affect. Her behavior is normal.          Assessment & Plan:   Diabetes mellitus type 2.  Well-controlled.  No change in regimen.   Continue metformin Essential hypertension well-controlled Dyslipidemia continue statin therapy Hypothyroidism.  TSH slightly elevated.  Will increase levothyroxine to 50 mcg daily  All medications refilled Flu vaccine administered  Follow-up/CPX 6 months   [name]

## 2018-08-30 NOTE — Patient Instructions (Signed)
Limit your sodium (Salt) intake   Please check your hemoglobin A1c every 3-6 months  Limit your sodium (Salt) intake  Please check your blood pressure on a regular basis.  If it is consistently greater than 150/90, please make an office appointment.    It is important that you exercise regularly, at least 20 minutes 3 to 4 times per week.  If you develop chest pain or shortness of breath seek  medical attention.

## 2018-09-02 ENCOUNTER — Other Ambulatory Visit: Payer: Self-pay

## 2018-09-18 ENCOUNTER — Encounter (HOSPITAL_COMMUNITY): Payer: Self-pay | Admitting: Emergency Medicine

## 2018-09-18 ENCOUNTER — Emergency Department (HOSPITAL_COMMUNITY)
Admission: EM | Admit: 2018-09-18 | Discharge: 2018-09-18 | Disposition: A | Discharge status: Home / Self Care | Payer: Medicare HMO | Attending: Emergency Medicine | Admitting: Emergency Medicine

## 2018-09-18 ENCOUNTER — Emergency Department (HOSPITAL_COMMUNITY): Payer: Medicare HMO

## 2018-09-18 DIAGNOSIS — J209 Acute bronchitis, unspecified: Secondary | ICD-10-CM | POA: Diagnosis not present

## 2018-09-18 DIAGNOSIS — Z7984 Long term (current) use of oral hypoglycemic drugs: Secondary | ICD-10-CM | POA: Diagnosis not present

## 2018-09-18 DIAGNOSIS — Z87891 Personal history of nicotine dependence: Secondary | ICD-10-CM | POA: Diagnosis not present

## 2018-09-18 DIAGNOSIS — E119 Type 2 diabetes mellitus without complications: Secondary | ICD-10-CM | POA: Diagnosis not present

## 2018-09-18 DIAGNOSIS — Z79899 Other long term (current) drug therapy: Secondary | ICD-10-CM | POA: Insufficient documentation

## 2018-09-18 DIAGNOSIS — I10 Essential (primary) hypertension: Secondary | ICD-10-CM | POA: Insufficient documentation

## 2018-09-18 DIAGNOSIS — J069 Acute upper respiratory infection, unspecified: Secondary | ICD-10-CM | POA: Diagnosis not present

## 2018-09-18 DIAGNOSIS — Z7982 Long term (current) use of aspirin: Secondary | ICD-10-CM | POA: Diagnosis not present

## 2018-09-18 DIAGNOSIS — B9789 Other viral agents as the cause of diseases classified elsewhere: Secondary | ICD-10-CM | POA: Diagnosis not present

## 2018-09-18 DIAGNOSIS — R05 Cough: Secondary | ICD-10-CM | POA: Diagnosis not present

## 2018-09-18 MED ORDER — PREDNISONE 10 MG PO TABS: 50.0000 mg | ORAL_TABLET | Freq: Every day | ORAL | 0 refills | Status: DC

## 2018-09-18 MED ORDER — PREDNISONE 20 MG PO TABS
60.0000 mg | ORAL_TABLET | Freq: Once | ORAL | Status: AC
  Administered 2018-09-18: 60 mg via ORAL
  Filled 2018-09-18: qty 3

## 2018-09-18 NOTE — Discharge Instructions (Signed)
We signed the ER for cough, with possible blood-tinged to it.  X-rays not showing any signs of bacterial pneumonia We suspect that you are having viral bronchitis, which is the most common cause for bloody phlegm. You do not have any underlying lung disease or risk factors for tumors or blood clots -therefore we all have agreed for conservative management at this time with steroids and cough medications.  Please see your doctor in 1 week if your symptoms have not resolved.  Return to the ER immediately if you start having chest pain, shortness of breath, fevers, near fainting or fainting spells.

## 2018-09-18 NOTE — ED Triage Notes (Signed)
Patient here from home with complaints of productive cough x1 week. OTC meds with no relief.

## 2018-09-18 NOTE — ED Provider Notes (Signed)
[facility] DEPT Provider Note   CSN: [id number] Arrival date & time: 09/18/18  1019     History   Chief Complaint Chief Complaint  Patient presents with  . Cough    HPI Danielle Atkins is a 83 y.o. female.  HPI  83 year old female comes in with chief complaint of cough.  She has history of diabetes.  She reports that over the past 2 weeks has been having cough.  Patient reports that her cough is worse at nighttime.  Initially the cough was dry, but now she has noticed some blood-tinged in her phlegm.  She denies any associated nausea, vomiting, fevers, chills, chest pain, shortness of breath.  Pt has no hx of PE, DVT and denies any exogenous hormone (testosterone / estrogen) use, long distance travels or surgery in the past 6 weeks, active cancer, recent immobilization. Pt is not a smoker.  Past Medical History:  Diagnosis Date  . ANEMIA-NOS 06/25/2007  . GOUT 11/16/2009  . PARESTHESIA, HANDS 03/27/2009  . PEPTIC ULCER DISEASE 06/25/2007  . RENAL INSUFFICIENCY 06/25/2007  . TRANSIENT ISCHEMIC ATTACK, HX OF 06/25/2007    Patient Active Problem List   Diagnosis Date Noted  . Diabetes mellitus with renal complications ([place]) 07/62/2633  . GOUT 11/16/2009  . PARESTHESIA, HANDS 03/27/2009  . Pure hypercholesterolemia 06/25/2007  . ANEMIA-NOS 06/25/2007  . Essential hypertension 06/25/2007  . PEPTIC ULCER DISEASE 06/25/2007  . Disorder resulting from impaired renal function 06/25/2007  . TRANSIENT ISCHEMIC ATTACK, HX OF 06/25/2007    History reviewed. No pertinent surgical history.   OB History   None      Home Medications    Prior to Admission medications   Medication Sig Start Date End Date Taking? Authorizing Provider  albuterol (PROAIR HFA) 108 (90 Base) MCG/ACT inhaler Inhale 2 puffs into the lungs every 6 (six) hours as needed for wheezing. 09/18/17  Yes [name], MD  allopurinol (ZYLOPRIM) 300 MG tablet Take 1 tablet (300  mg total) by mouth daily. 08/30/18  Yes [name], MD  aspirin 81 MG tablet Take 1 tablet (81 mg total) by mouth daily. 06/29/14  Yes [name], MD  famotidine (PEPCID) 40 MG tablet Take 1 tablet (40 mg total) by mouth daily. 08/30/18  Yes [name], MD  glucose blood test strip 1 each by Other route daily as needed for other. 04/12/13  Yes [name], MD  levothyroxine (SYNTHROID, LEVOTHROID) 50 MCG tablet Take 0.5 tablets (25 mcg total) by mouth daily before breakfast. Patient taking differently: Take 50 mcg by mouth daily before breakfast.  08/30/18  Yes [name], MD  lisinopril (PRINIVIL,ZESTRIL) 10 MG tablet Take 1 tablet (10 mg total) by mouth daily. 08/30/18  Yes [name], MD  metFORMIN (GLUCOPHAGE) 500 MG tablet TAKE 1 TABLET EVERY DAY  WITH  BREAKFAST 08/30/18  Yes [name], MD  Multiple Vitamin (MULTIVITAMIN WITH MINERALS) TABS Take 1 tablet by mouth daily.   Yes [provider]  simvastatin (ZOCOR) 40 MG tablet Take 1 tablet (40 mg total) by mouth daily. 08/30/18  Yes [name], MD  predniSONE (DELTASONE) 10 MG tablet Take 5 tablets (50 mg total) by mouth daily. 09/18/18   Varney Biles, MD    Family History No family history on file.  Social History Social History   Tobacco Use  . Smoking status: Former Smoker    Last attempt to quit: 12/16/1999    Years  since quitting: 18.7  . Smokeless tobacco: Never Used  Substance Use Topics  . Alcohol use: No  . Drug use: No     Allergies   Patient has no known allergies.   Review of Systems Review of Systems  Constitutional: Positive for activity change. Negative for diaphoresis and unexpected weight change.  Respiratory: Positive for cough. Negative for shortness of breath.   Cardiovascular: Negative for chest pain.  Gastrointestinal: Negative for nausea and vomiting.  Allergic/Immunologic: Negative for immunocompromised state.    Hematological: Does not bruise/bleed easily.  All other systems reviewed and are negative.    Physical Exam Updated Vital Signs BP (!) 148/95 (BP Location: Left Arm)   Pulse 93   Temp 98.6 F (37 C) (Oral)   Resp 20   SpO2 97%   Physical Exam  Constitutional: She is oriented to person, place, and time. She appears well-developed.  HENT:  Head: Normocephalic and atraumatic.  Eyes: EOM are normal.  Neck: Normal range of motion. Neck supple.  Cardiovascular: Normal rate.  Pulmonary/Chest: Effort normal. She has no wheezes.  Abdominal: Bowel sounds are normal.  Musculoskeletal: She exhibits no edema or tenderness.  Neurological: She is alert and oriented to person, place, and time.  Skin: Skin is warm and dry.  Nursing note and vitals reviewed.    ED Treatments / Results  Labs (all labs ordered are listed, but only abnormal results are displayed) Labs Reviewed - No data to display  EKG None  Radiology Dg Chest 2 View  Result Date: 09/18/2018 CLINICAL DATA:  Cough for 1 week, chest tightness, hypertension EXAM: CHEST - 2 VIEW COMPARISON:  01/02/2018 FINDINGS: Enlargement of cardiac silhouette. Calcified tortuous aorta. Mediastinal contours and pulmonary vascularity otherwise normal. Minimal chronic accentuation of interstitial markings, stable Lungs otherwise clear. No acute infiltrate, pleural effusion or pneumothorax. No acute osseous findings. IMPRESSION: Enlargement of cardiac silhouette. No acute abnormalities. Electronically Signed   By: Lavonia Dana M.D.   On: 09/18/2018 11:30    Procedures Procedures (including critical care time)  Medications Ordered in ED Medications  predniSONE (DELTASONE) tablet 60 mg (60 mg Oral Given 09/18/18 1343)     Initial Impression / Assessment and Plan / ED Course  I have reviewed the triage vital signs and the nursing notes.  Pertinent labs & imaging results that were available during my care of the patient were reviewed by me  and considered in my medical decision making (see chart for details).     DDX includes: Viral syndrome Influenza Pharyngitis Sinusitis   PT comes in with cc of cough.  She has a blood tinge to her phlegm.  Patient does not have any PE risk factors.  She has no chest pain, shortness of breath and there are no constitutional that are concerning for tumor.  Patient is agreed with a conservative plan of getting a chest x-ray.  If x-rays negative for any infection then she will go home with medications for bronchitis.  If her symptoms were to get worse she will return to the ER for ED work-up for CT PE.  If her symptoms are not worsening then she will follow-up with her doctor for outpatient management.   Final Clinical Impressions(s) / ED Diagnoses   Final diagnoses:  Viral URI with cough  Acute bronchitis, unspecified organism    ED Discharge Orders         Ordered    predniSONE (DELTASONE) 10 MG tablet  Daily     09/18/18 1329  Varney Biles, MD 09/18/18 [phone]

## 2018-09-22 ENCOUNTER — Ambulatory Visit (INDEPENDENT_AMBULATORY_CARE_PROVIDER_SITE_OTHER): Payer: Medicare HMO | Admitting: Family Medicine

## 2018-09-22 ENCOUNTER — Encounter: Payer: Self-pay | Admitting: Family Medicine

## 2018-09-22 ENCOUNTER — Ambulatory Visit: Payer: Medicare HMO | Admitting: Family Medicine

## 2018-09-22 VITALS — BP 134/78 | HR 95 | Temp 98.1°F | Wt 183.7 lb

## 2018-09-22 DIAGNOSIS — E611 Iron deficiency: Secondary | ICD-10-CM | POA: Diagnosis not present

## 2018-09-22 DIAGNOSIS — R05 Cough: Secondary | ICD-10-CM | POA: Diagnosis not present

## 2018-09-22 DIAGNOSIS — I10 Essential (primary) hypertension: Secondary | ICD-10-CM

## 2018-09-22 DIAGNOSIS — R059 Cough, unspecified: Secondary | ICD-10-CM

## 2018-09-22 LAB — CBC WITH DIFFERENTIAL/PLATELET
BASOS PCT: 0.7 % (ref 0.0–3.0)
Basophils Absolute: 0.1 10*3/uL (ref 0.0–0.1)
EOS PCT: 0.3 % (ref 0.0–5.0)
Eosinophils Absolute: 0 10*3/uL (ref 0.0–0.7)
HEMATOCRIT: 36.6 % (ref 36.0–46.0)
HEMOGLOBIN: 11 g/dL — AB (ref 12.0–15.0)
LYMPHS PCT: 26.7 % (ref 12.0–46.0)
Lymphs Abs: 3 10*3/uL (ref 0.7–4.0)
MCHC: 30.2 g/dL (ref 30.0–36.0)
MCV: 70.2 fl — AB (ref 78.0–100.0)
MONOS PCT: 6.1 % (ref 3.0–12.0)
Monocytes Absolute: 0.7 10*3/uL (ref 0.1–1.0)
Neutro Abs: 7.5 10*3/uL (ref 1.4–7.7)
Neutrophils Relative %: 66.2 % (ref 43.0–77.0)
Platelets: 263 10*3/uL (ref 150.0–400.0)
RBC: 5.21 Mil/uL — ABNORMAL HIGH (ref 3.87–5.11)
RDW: 19.8 % — AB (ref 11.5–15.5)
WBC: 11.3 10*3/uL — ABNORMAL HIGH (ref 4.0–10.5)

## 2018-09-22 LAB — COMPREHENSIVE METABOLIC PANEL
ALBUMIN: 3.9 g/dL (ref 3.5–5.2)
ALK PHOS: 49 U/L (ref 39–117)
ALT: 13 U/L (ref 0–35)
AST: 15 U/L (ref 0–37)
BUN: 17 mg/dL (ref 6–23)
CHLORIDE: 104 meq/L (ref 96–112)
CO2: 33 mEq/L — ABNORMAL HIGH (ref 19–32)
Calcium: 9.6 mg/dL (ref 8.4–10.5)
Creatinine, Ser: 1.15 mg/dL (ref 0.40–1.20)
GFR: 58.15 mL/min — AB (ref >60.00)
Glucose, Bld: 86 mg/dL (ref 70–99)
POTASSIUM: 3.8 meq/L (ref 3.5–5.1)
SODIUM: 142 meq/L (ref 135–145)
TOTAL PROTEIN: 7.3 g/dL (ref 6.0–8.3)
Total Bilirubin: 0.6 mg/dL (ref 0.2–1.2)

## 2018-09-22 LAB — BRAIN NATRIURETIC PEPTIDE: PRO B NATRI PEPTIDE: 178 pg/mL — AB (ref 0.0–100.0)

## 2018-09-22 MED ORDER — LOSARTAN POTASSIUM 25 MG PO TABS: 25.0000 mg | ORAL_TABLET | Freq: Every day | ORAL | 2 refills | Status: DC

## 2018-09-22 NOTE — Patient Instructions (Addendum)
   Use albuterol with spacing chamber three times daily for next couple of days, then just as needed.  Stop lisinopril. Start losartan. Monitor symptoms for 2 weeks; let me know if any worsening or if not improved in that time.

## 2018-09-22 NOTE — Progress Notes (Signed)
Danielle Atkins DOB: 10-Mar-1935 Encounter date: 09/22/2018  This is a 83 y.o. female who presents with Chief Complaint  Patient presents with  . Hospitalization Follow-up    History of present illness:  Doesn't feel that bad; just cough; in throat and not productive. Would like to get phlegm somewhere.   Did not want to take prednisone; has seen family members not do well on it. Tessalon not helping (taking TID).   Has been coughing for 2 weeks now; coughing worse in that she is coughing more. No fevers but getting hot and cold. Ended up going to ER due to blood tinge in phlegm. Cough seemed to come out of the blue. No other sick symptoms.   Tries to eat healthy; does silver sneakers (not since cough).   Sometimes feels short of breath/wheezy. Has been using albuterol more regularly. Got inhaler when she lived in [place] but never diagnosed with lung disease. Not usually prolonged cough with illness.   No sick contacts.       No Known Allergies Current Meds  Medication Sig  . albuterol (PROAIR HFA) 108 (90 Base) MCG/ACT inhaler Inhale 2 puffs into the lungs every 6 (six) hours as needed for wheezing.  Marland Kitchen allopurinol (ZYLOPRIM) 300 MG tablet Take 1 tablet (300 mg total) by mouth daily.  Marland Kitchen aspirin 81 MG tablet Take 1 tablet (81 mg total) by mouth daily.  . famotidine (PEPCID) 40 MG tablet Take 1 tablet (40 mg total) by mouth daily.  Marland Kitchen glucose blood test strip 1 each by Other route daily as needed for other.  . levothyroxine (SYNTHROID, LEVOTHROID) 50 MCG tablet Take 0.5 tablets (25 mcg total) by mouth daily before breakfast. (Patient taking differently: Take 50 mcg by mouth daily before breakfast. )  . metFORMIN (GLUCOPHAGE) 500 MG tablet TAKE 1 TABLET EVERY DAY  WITH  BREAKFAST  . Multiple Vitamin (MULTIVITAMIN WITH MINERALS) TABS Take 1 tablet by mouth daily.  . predniSONE (DELTASONE) 10 MG tablet Take 5 tablets (50 mg total) by mouth daily.  . simvastatin (ZOCOR) 40 MG tablet Take  1 tablet (40 mg total) by mouth daily.  . [DISCONTINUED] lisinopril (PRINIVIL,ZESTRIL) 10 MG tablet Take 1 tablet (10 mg total) by mouth daily.    Review of Systems  Constitutional: Positive for fatigue. Negative for chills and fever.  HENT: Positive for congestion (related to allergies). Negative for ear pain and sinus pain.   Respiratory: Positive for cough, shortness of breath and wheezing. Negative for chest tightness.   Cardiovascular: Negative for chest pain, palpitations and leg swelling.  Gastrointestinal:       Takes pepcid for ulcer; has for year. No sx of reflux or heartburn.      Objective:  BP 134/78 (BP Location: Left Arm, Patient Position: Sitting, Cuff Size: Normal)   Pulse 95   Temp 98.1 F (36.7 C) (Oral)   Wt 183 lb 11.2 oz (83.3 kg)   SpO2 96%   BMI 31.53 kg/m   Weight: 183 lb 11.2 oz (83.3 kg)   BP Readings from Last 3 Encounters:  09/22/18 134/78  09/18/18 (!) 148/95  08/30/18 110/60   Wt Readings from Last 3 Encounters:  09/22/18 183 lb 11.2 oz (83.3 kg)  08/30/18 177 lb 3.2 oz (80.4 kg)  02/24/18 179 lb (81.2 kg)    Physical Exam  Constitutional: She is oriented to person, place, and time. She appears well-developed and well-nourished. No distress.  HENT:  Head: Normocephalic and atraumatic.  Right Ear: Hearing,  tympanic membrane and ear canal normal.  Left Ear: Hearing, tympanic membrane and ear canal normal.  Cardiovascular: Normal rate, regular rhythm and normal heart sounds. Exam reveals no friction rub.  No murmur heard. Trace bilat LE edema  Pulmonary/Chest: Effort normal and breath sounds normal. No respiratory distress. She has no wheezes. She has no rales.  Neurological: She is alert and oriented to person, place, and time.  Psychiatric: She has a normal mood and affect. Her speech is normal and behavior is normal. Cognition and memory are normal.    Assessment/Plan 1. Cough Today is last day of prednisone and without significant  improvement in cough. Suspect lisinopril might be trigger so we will switch to losartan and monitor symptoms. Lung exam benign and recent CXR negative (from ER). If cough has not improved by next week she will let me know. Bloodwork in meanwhile and call if any worsening of symptoms. Due to slight weight gain will add on BNP.    Return if symptoms worsen or fail to improve.    Micheline Rough, MD

## 2018-09-23 DIAGNOSIS — E611 Iron deficiency: Secondary | ICD-10-CM | POA: Diagnosis not present

## 2018-09-23 NOTE — Progress Notes (Signed)
LM 10/10

## 2018-09-24 LAB — IRON,TIBC AND FERRITIN PANEL
%SAT: 26 % (calc) (ref 16–45)
Ferritin: 73 ng/mL (ref 16–288)
IRON: 81 ug/dL (ref 45–160)
TIBC: 308 ug/dL (ref 250–450)

## 2018-09-30 ENCOUNTER — Encounter: Payer: Self-pay | Admitting: Family Medicine

## 2018-09-30 ENCOUNTER — Ambulatory Visit (INDEPENDENT_AMBULATORY_CARE_PROVIDER_SITE_OTHER): Payer: Medicare HMO | Admitting: Family Medicine

## 2018-09-30 VITALS — BP 124/62 | HR 104 | Temp 97.7°F | Wt 178.3 lb

## 2018-09-30 DIAGNOSIS — I10 Essential (primary) hypertension: Secondary | ICD-10-CM | POA: Diagnosis not present

## 2018-09-30 DIAGNOSIS — R05 Cough: Secondary | ICD-10-CM

## 2018-09-30 DIAGNOSIS — R059 Cough, unspecified: Secondary | ICD-10-CM

## 2018-09-30 MED ORDER — LOSARTAN POTASSIUM 25 MG PO TABS: 25.0000 mg | ORAL_TABLET | Freq: Every day | ORAL | 2 refills | Status: DC

## 2018-09-30 MED ORDER — IPRATROPIUM-ALBUTEROL 0.5-2.5 (3) MG/3ML IN SOLN
3.0000 mL | Freq: Once | RESPIRATORY_TRACT | Status: AC
  Administered 2018-09-30: 3 mL via RESPIRATORY_TRACT

## 2018-09-30 MED ORDER — METHYLPREDNISOLONE ACETATE 80 MG/ML IJ SUSP
80.0000 mg | Freq: Once | INTRAMUSCULAR | Status: AC
  Administered 2018-09-30: 80 mg via INTRAMUSCULAR

## 2018-09-30 NOTE — Progress Notes (Signed)
ALYRIA KRACK DOB: 11/14/1935 Encounter date: 09/30/2018  This is a 83 y.o. female who presents with Chief Complaint  Patient presents with  . Follow-up    finished prednisone 10/13, cough improved somewhat, cough is worse in the morning, coughing up mucus, chest congestion    History of present illness: Cough that started back in September. Initially states that it seemed like she had an illness that started with cough; not feeling well with it; but this has passed and cough remains.   Thinks cough is better. Starting to get sore in chest/body.   Is able to bring up some phlegm before so feels like this makes it better. During the day usually not bothered as much. Much worse in morning and at night.   Does feel somewhat wheezy.   If talking a lot then starts with coughing fits.   Didn't get the losartan; ended up on phone multiple times with [facility] to try and get medication.   Does feel like inhaler helps with cough.   Cough not related to lying down; more with just evening hours.   Coughing not waking her up at night. Wakes to urinate (more chronic issue).     No Known Allergies Current Meds  Medication Sig  . albuterol (PROAIR HFA) 108 (90 Base) MCG/ACT inhaler Inhale 2 puffs into the lungs every 6 (six) hours as needed for wheezing.  Marland Kitchen allopurinol (ZYLOPRIM) 300 MG tablet Take 1 tablet (300 mg total) by mouth daily.  Marland Kitchen aspirin 81 MG tablet Take 1 tablet (81 mg total) by mouth daily.  . famotidine (PEPCID) 40 MG tablet Take 1 tablet (40 mg total) by mouth daily.  Marland Kitchen glucose blood test strip 1 each by Other route daily as needed for other.  . levothyroxine (SYNTHROID, LEVOTHROID) 50 MCG tablet Take 0.5 tablets (25 mcg total) by mouth daily before breakfast. (Patient taking differently: Take 50 mcg by mouth daily before breakfast. )  . losartan (COZAAR) 25 MG tablet Take 1 tablet (25 mg total) by mouth daily.  . metFORMIN (GLUCOPHAGE) 500 MG tablet TAKE 1 TABLET EVERY DAY   WITH  BREAKFAST  . Multiple Vitamin (MULTIVITAMIN WITH MINERALS) TABS Take 1 tablet by mouth daily.  . simvastatin (ZOCOR) 40 MG tablet Take 1 tablet (40 mg total) by mouth daily.    Review of Systems  Constitutional: Negative for activity change, appetite change, chills, fatigue and fever.  HENT: Negative for congestion.   Respiratory: Positive for cough. Negative for chest tightness, shortness of breath and wheezing.   Cardiovascular: Positive for chest pain (just with coughing). Negative for palpitations and leg swelling.  Gastrointestinal:       Denies heartburn  Psychiatric/Behavioral: Negative for sleep disturbance (cough is not bothering her through night).    Objective:  BP 124/62 (BP Location: Left Arm, Patient Position: Sitting, Cuff Size: Normal)   Pulse (!) 104   Temp 97.7 F (36.5 C) (Oral)   Wt 178 lb 4.8 oz (80.9 kg)   SpO2 91%   BMI 30.61 kg/m   Weight: 178 lb 4.8 oz (80.9 kg)   BP Readings from Last 3 Encounters:  09/30/18 124/62  09/22/18 134/78  09/18/18 (!) 148/95   Wt Readings from Last 3 Encounters:  09/30/18 178 lb 4.8 oz (80.9 kg)  09/22/18 183 lb 11.2 oz (83.3 kg)  08/30/18 177 lb 3.2 oz (80.4 kg)    Physical Exam  Constitutional: She is oriented to person, place, and time. She appears well-developed and well-nourished.  No distress.  Cardiovascular: Normal rate, regular rhythm and normal heart sounds. Exam reveals no friction rub.  No murmur heard. No lower extremity edema  Pulmonary/Chest: Effort normal and breath sounds normal. No respiratory distress. She has no decreased breath sounds. She has no wheezes. She has no rhonchi. She has no rales.  Cough does sound more open than previous.   Neurological: She is alert and oriented to person, place, and time.  Psychiatric: Her behavior is normal. Cognition and memory are normal.    Assessment/Plan 1. Cough Her exam is stable. She is not short of breath; more just having coughing spasms. She is  still on the lisinopril since she has not received the new bp medication yet. We discussed at this point since she feels some improvement with cough overall I think it is worthwhile to see how she does with switching out lisinopril for losartan. If she is not noting improvement by next week or with any worsening sooner I would consider further evaluation. We also discussed that even with a typical bronchitis cough can linger for weeks and with smoking history it may be more likely to do so. Encouraged her to use the albuterol inhaler BID to help with spasm. Seems to be worst when she first gets up and moving around and at end of day.  - ipratropium-albuterol (DUONEB) 0.5-2.5 (3) MG/3ML nebulizer solution 3 mL - methylPREDNISolone acetate (DEPO-MEDROL) injection 80 mg  2. Hypertension, unspecified type  - losartan (COZAAR) 25 MG tablet; Take 1 tablet (25 mg total) by mouth daily.  Dispense: 30 tablet; Refill: 2   Return pending update next week.    Micheline Rough, MD

## 2018-09-30 NOTE — Patient Instructions (Signed)
Use the albuterol inhaler twice daily to help with coughing spasm.   We will call you next week to check in; let me know sooner if change in status.

## 2018-10-04 ENCOUNTER — Telehealth: Payer: Self-pay

## 2018-10-04 NOTE — Telephone Encounter (Signed)
Noted patient is aware 

## 2018-10-04 NOTE — Telephone Encounter (Signed)
Caren Macadam, MD  Rebecca Eaton, CMA        Please check in with Lileigh next week (Monday) and see how cough is doing and how she is feeling. Let me know. Thanks!

## 2018-10-04 NOTE — Telephone Encounter (Signed)
Spoke with patient she sated she is feeling much better. She also said that she has started the new BP medication and can tell a difference with that as well.

## 2018-10-04 NOTE — Telephone Encounter (Signed)
I am so happy to hear this! Please have her give [place] an update in 2 weeks time. I would expect her to be feeling back to baseline at that point. Anything worse in meanwhile let me know.

## 2018-10-20 ENCOUNTER — Other Ambulatory Visit: Payer: Self-pay | Admitting: Internal Medicine

## 2018-11-22 ENCOUNTER — Ambulatory Visit: Payer: Self-pay | Admitting: *Deleted

## 2018-11-22 NOTE — Telephone Encounter (Signed)
"  Pt states she was taking her levothyroxine (SYNTHROID, LEVOTHROID) 50 MCG tablet wrong. She was taking 1 tablet daily instead of 0.5 tablet. She states she is out of meds and her throat is now starting to hurt as well. She is wanting to see if this can be refilled early? Pharmacy stated she cannot fill the medicine until February".  Called pt regarding above message. Pt stated that she was taking her levothyroxine medication wrong, not breaking it in half as prescribed. Now she is completely out. She has been out now for about 1.5 weeks. No other symptoms of not taking the levothyroxine. Her medication was last filled 08/30/18 for 90 tabs and 4 refills. She stated that her throat starting burning, like reflux. She thought it was related. She has a hx of reflux. She is taking Pepcid.  Flow at [facility] notified regarding the levothyroxine. Will route it to her provider. She is going out of town on Friday until after Christmas.  Home care advice given to patient regarding the reflux, sit up after eating, avoid spicy, acidic  Foods. Call back for increase in symptoms. Pt voiced understanding.  Reason for Disposition . [1] DOUBLE DOSE (an extra dose or lesser amount) of prescription drug AND [2] NO symptoms (Exception: a double dose of antibiotics)  Answer Assessment - Initial Assessment Questions 1. SYMPTOMS: "Do you have any symptoms?"     No symptoms from not taking a mediction 2. SEVERITY: If symptoms are present, ask "Are they mild, moderate or severe?"     n/a  Protocols used: MEDICATION QUESTION CALL-A-AH

## 2018-11-23 MED ORDER — LEVOTHYROXINE SODIUM 50 MCG PO TABS: 25.0000 ug | ORAL_TABLET | Freq: Every day | ORAL | 1 refills | Status: DC

## 2018-11-23 NOTE — Addendum Note (Signed)
Addended by: Westley Hummer B on: 11/23/2018 07:07 AM   Modules accepted: Orders

## 2018-11-23 NOTE — Telephone Encounter (Signed)
Refill sent.

## 2018-11-29 ENCOUNTER — Other Ambulatory Visit: Payer: Self-pay | Admitting: Family Medicine

## 2018-11-29 DIAGNOSIS — I10 Essential (primary) hypertension: Secondary | ICD-10-CM

## 2018-12-22 ENCOUNTER — Other Ambulatory Visit: Payer: Self-pay | Admitting: Internal Medicine

## 2018-12-22 DIAGNOSIS — Z1231 Encounter for screening mammogram for malignant neoplasm of breast: Secondary | ICD-10-CM

## 2018-12-24 ENCOUNTER — Other Ambulatory Visit: Payer: Self-pay

## 2018-12-24 MED ORDER — ALBUTEROL SULFATE HFA 108 (90 BASE) MCG/ACT IN AERS: 2.0000 | INHALATION_SPRAY | Freq: Four times a day (QID) | RESPIRATORY_TRACT | 5 refills | Status: DC | PRN

## 2018-12-24 NOTE — Telephone Encounter (Signed)
Last filled 09/18/17 by Dr.K Last OV with [facility] 09/30/18  Ok to fill?

## 2018-12-28 ENCOUNTER — Other Ambulatory Visit: Payer: Self-pay | Admitting: *Deleted

## 2018-12-28 MED ORDER — ALBUTEROL SULFATE HFA 108 (90 BASE) MCG/ACT IN AERS: 2.0000 | INHALATION_SPRAY | Freq: Four times a day (QID) | RESPIRATORY_TRACT | 2 refills | Status: AC | PRN

## 2019-01-31 ENCOUNTER — Ambulatory Visit: Payer: Medicare HMO

## 2019-02-01 ENCOUNTER — Ambulatory Visit
Admission: RE | Admit: 2019-02-01 | Discharge: 2019-02-01 | Disposition: A | Discharge status: Home / Self Care | Payer: Medicare HMO | Source: Ambulatory Visit | Attending: Internal Medicine | Admitting: Internal Medicine

## 2019-02-01 DIAGNOSIS — Z1231 Encounter for screening mammogram for malignant neoplasm of breast: Secondary | ICD-10-CM

## 2019-02-03 ENCOUNTER — Other Ambulatory Visit: Payer: Self-pay | Admitting: Internal Medicine

## 2019-02-03 DIAGNOSIS — R928 Other abnormal and inconclusive findings on diagnostic imaging of breast: Secondary | ICD-10-CM

## 2019-02-14 ENCOUNTER — Ambulatory Visit
Admission: RE | Admit: 2019-02-14 | Discharge: 2019-02-14 | Disposition: A | Discharge status: Home / Self Care | Payer: Medicare HMO | Source: Ambulatory Visit | Attending: Internal Medicine | Admitting: Internal Medicine

## 2019-02-14 ENCOUNTER — Other Ambulatory Visit: Payer: Self-pay | Admitting: Internal Medicine

## 2019-02-14 DIAGNOSIS — R928 Other abnormal and inconclusive findings on diagnostic imaging of breast: Secondary | ICD-10-CM | POA: Diagnosis not present

## 2019-02-14 DIAGNOSIS — N6011 Diffuse cystic mastopathy of right breast: Secondary | ICD-10-CM | POA: Diagnosis not present

## 2019-02-14 DIAGNOSIS — N6012 Diffuse cystic mastopathy of left breast: Secondary | ICD-10-CM | POA: Diagnosis not present

## 2019-02-14 DIAGNOSIS — N6310 Unspecified lump in the right breast, unspecified quadrant: Secondary | ICD-10-CM

## 2019-02-23 ENCOUNTER — Other Ambulatory Visit: Payer: Self-pay | Admitting: Internal Medicine

## 2019-03-01 ENCOUNTER — Encounter: Payer: Self-pay | Admitting: Internal Medicine

## 2019-03-01 ENCOUNTER — Ambulatory Visit (INDEPENDENT_AMBULATORY_CARE_PROVIDER_SITE_OTHER): Payer: Medicare HMO | Admitting: Internal Medicine

## 2019-03-01 ENCOUNTER — Other Ambulatory Visit: Payer: Self-pay | Admitting: *Deleted

## 2019-03-01 ENCOUNTER — Ambulatory Visit (INDEPENDENT_AMBULATORY_CARE_PROVIDER_SITE_OTHER): Payer: Medicare HMO

## 2019-03-01 ENCOUNTER — Other Ambulatory Visit: Payer: Self-pay

## 2019-03-01 VITALS — BP 120/80 | HR 93 | Temp 98.3°F | Ht 64.0 in | Wt 176.5 lb

## 2019-03-01 DIAGNOSIS — R05 Cough: Secondary | ICD-10-CM

## 2019-03-01 DIAGNOSIS — E039 Hypothyroidism, unspecified: Secondary | ICD-10-CM

## 2019-03-01 DIAGNOSIS — R053 Chronic cough: Secondary | ICD-10-CM

## 2019-03-01 DIAGNOSIS — E1121 Type 2 diabetes mellitus with diabetic nephropathy: Secondary | ICD-10-CM

## 2019-03-01 DIAGNOSIS — Z13820 Encounter for screening for osteoporosis: Secondary | ICD-10-CM | POA: Diagnosis not present

## 2019-03-01 DIAGNOSIS — I10 Essential (primary) hypertension: Secondary | ICD-10-CM

## 2019-03-01 DIAGNOSIS — Z780 Asymptomatic menopausal state: Secondary | ICD-10-CM | POA: Diagnosis not present

## 2019-03-01 DIAGNOSIS — K279 Peptic ulcer, site unspecified, unspecified as acute or chronic, without hemorrhage or perforation: Secondary | ICD-10-CM

## 2019-03-01 DIAGNOSIS — M1A9XX0 Chronic gout, unspecified, without tophus (tophi): Secondary | ICD-10-CM

## 2019-03-01 DIAGNOSIS — E7800 Pure hypercholesterolemia, unspecified: Secondary | ICD-10-CM | POA: Diagnosis not present

## 2019-03-01 DIAGNOSIS — E0821 Diabetes mellitus due to underlying condition with diabetic nephropathy: Secondary | ICD-10-CM

## 2019-03-01 LAB — POCT GLYCOSYLATED HEMOGLOBIN (HGB A1C): HEMOGLOBIN A1C: 5.7 % — AB (ref 4.0–5.6)

## 2019-03-01 MED ORDER — LEVOTHYROXINE SODIUM 50 MCG PO TABS: 25.0000 ug | ORAL_TABLET | Freq: Every day | ORAL | 1 refills | Status: DC

## 2019-03-01 MED ORDER — LOSARTAN POTASSIUM 25 MG PO TABS: 25.0000 mg | ORAL_TABLET | Freq: Every day | ORAL | 1 refills | Status: DC

## 2019-03-01 MED ORDER — METFORMIN HCL 500 MG PO TABS: ORAL_TABLET | ORAL | 1 refills | Status: DC

## 2019-03-01 MED ORDER — ALLOPURINOL 300 MG PO TABS: 300.0000 mg | ORAL_TABLET | Freq: Every day | ORAL | 1 refills | Status: DC

## 2019-03-01 MED ORDER — FAMOTIDINE 40 MG PO TABS: 40.0000 mg | ORAL_TABLET | Freq: Every day | ORAL | 1 refills | Status: DC

## 2019-03-01 MED ORDER — SIMVASTATIN 40 MG PO TABS: 40.0000 mg | ORAL_TABLET | Freq: Every day | ORAL | 1 refills | Status: DC

## 2019-03-01 NOTE — Patient Instructions (Addendum)
Great meeting you this morning.  Instructions: -Follow up in 6 months for your annual physical exam, come fasting, so we can draw blood work. -CXR today. -Consider getting shingles at pharmacy. -Follow up for breast imaging as scheduled.

## 2019-03-01 NOTE — Progress Notes (Signed)
Established Patient Office Visit     CC/Reason for Visit: transfer of care, follow up chronic conditions  HPI: Danielle Atkins is a 84 y.o. female who is coming in today for the above mentioned reasons. Past Medical History is significant for anemia, gout, PUD, TIA, diabetes, HTN, hypothyroid, hyperlipidemia and renal insufficiency.  Patient takes metformin for diabetes and takes as directed.  She is taking losartan for her blood pressure and is concerned that she is coughing due to her medicine.  She says she coughs the most in the morning. Her lisinopril was exchanged for losartan over the fall because of a cough. Patient sts that she is UTD on vaccine with exception of the shingles vaccine.  She had a mammogram a couple of weeks ago and has a follow up for a recall.  Patient says she has a hx of PUD and was hospitalized about 10 years ago at [facility], but denies any issues since.  She has no acute complaints today other than a continued cough.   Past Medical/Surgical History: Past Medical History:  Diagnosis Date  . ANEMIA-NOS 06/25/2007  . GOUT 11/16/2009  . PARESTHESIA, HANDS 03/27/2009  . PEPTIC ULCER DISEASE 06/25/2007  . RENAL INSUFFICIENCY 06/25/2007  . TRANSIENT ISCHEMIC ATTACK, HX OF 06/25/2007    No past surgical history on file.  Social History:  reports that she quit smoking about 19 years ago. She has never used smokeless tobacco. She reports that she does not drink alcohol or use drugs.  Allergies: No Known Allergies  Family History:  No family history on file. No MI, CVA or cancer that she is aware of.   Current Outpatient Medications:  .  albuterol (PROAIR HFA) 108 (90 Base) MCG/ACT inhaler, Inhale 2 puffs into the lungs every 6 (six) hours as needed for wheezing., Disp: 18 g, Rfl: 2 .  allopurinol (ZYLOPRIM) 300 MG tablet, Take 1 tablet (300 mg total) by mouth daily., Disp: 90 tablet, Rfl: 1 .  aspirin 81 MG tablet, Take 1 tablet (81 mg total) by mouth daily.,  Disp: 30 tablet, Rfl:  .  famotidine (PEPCID) 40 MG tablet, Take 1 tablet (40 mg total) by mouth daily., Disp: 90 tablet, Rfl: 1 .  glucose blood test strip, 1 each by Other route daily as needed for other., Disp: 100 each, Rfl: 12 .  levothyroxine (SYNTHROID, LEVOTHROID) 50 MCG tablet, Take 0.5 tablets (25 mcg total) by mouth daily before breakfast., Disp: 90 tablet, Rfl: 1 .  losartan (COZAAR) 25 MG tablet, TAKE 1 TABLET EVERY DAY, Disp: 90 tablet, Rfl: 1 .  metFORMIN (GLUCOPHAGE) 500 MG tablet, TAKE 1 TABLET EVERY DAY  WITH  BREAKFAST, Disp: 90 tablet, Rfl: 1 .  Multiple Vitamin (MULTIVITAMIN WITH MINERALS) TABS, Take 1 tablet by mouth daily., Disp: , Rfl:  .  simvastatin (ZOCOR) 40 MG tablet, Take 1 tablet (40 mg total) by mouth daily., Disp: 90 tablet, Rfl: 1  Review of Systems:  Constitutional: Denies fever, chills, diaphoresis, appetite change and fatigue.  HEENT: Denies photophobia, eye pain, redness, hearing loss, ear pain, congestion, sore throat, rhinorrhea, sneezing, mouth sores, trouble swallowing, neck pain, neck stiffness and tinnitus.   Respiratory: Denies SOB, DOE,  chest tightness,  and wheezing.   Cardiovascular: Denies chest pain, palpitations and leg swelling.  Gastrointestinal: Denies nausea, vomiting, abdominal pain, diarrhea, constipation, blood in stool and abdominal distention.  Genitourinary: Denies dysuria, urgency, frequency, hematuria, flank pain and difficulty urinating.  Endocrine: Denies: hot or cold intolerance,  sweats, changes in hair or nails, polyuria, polydipsia. Musculoskeletal: Denies myalgias, back pain, joint swelling, arthralgias and gait problem.  Skin: Denies pallor, rash and wound.  Neurological: Denies dizziness, seizures, syncope, weakness, light-headedness, numbness and headaches.  Hematological: Denies adenopathy. Easy bruising, personal or family bleeding history  Psychiatric/Behavioral: Denies suicidal ideation, mood changes, confusion,  nervousness, sleep disturbance and agitation   Physical Exam: Vitals:   03/01/19 0704  BP: 120/80  Pulse: 93  Temp: 98.3 F (36.8 C)  TempSrc: Oral  SpO2: 94%  Weight: 176 lb 8 oz (80.1 kg)  Height: 5\' 4"  (1.626 m)    Body mass index is 30.3 kg/m.   Constitutional: NAD, calm, comfortable Eyes: PERRL, lids and conjunctivae normal Respiratory: clear to auscultation bilaterally, no wheezing, no crackles. Normal respiratory effort. No accessory muscle use.  Cardiovascular: Regular rate and rhythm, no murmurs / rubs / gallops. No extremity edema. 2+ pedal pulses. s4 gallop present Abd: S,NT,ND,+BS Psychiatric: Normal judgment and insight. Alert and oriented x 3. Normal mood.    Impression and Plan:  Diabetes mellitus due to underlying condition with diabetic nephropathy, without long-term current use of insulin (HCC) -  -A1C is 5.7 today demonstrates excellent control. -Continue metformin.  Essential hypertension - -Well controlled. -Losartan should not cause cough.   Gout- -refill allopurinol (ZYLOPRIM) 300 MG tablet -no recent flare ups  PUD (peptic ulcer disease) -  -refill famotidine (PEPCID) 40 MG tablet  Pure hypercholesterolemia -  -Well controlled. -Last LDL 60 in 3/19. -Continue simvastatin  Screening for osteoporosis -  -DG BONE DENSITY (DXA)  Chronic cough -  -Since September. -Lisinopril was discontinued and placed on losartan. -Quit smoking 26 yrs ago. -When started had a URI but cough remains. -Think it is time to consider a CXR. Will order.  Hypothyroidism, unspecified type -  -levothyroxine (SYNTHROID, LEVOTHROID) 50 MCG tablet -recheck TSH next visit     Patient Instructions  Great meeting you this morning.  Instructions: -Follow up in 6 months for your annual physical exam, come fasting, so we can draw blood work. -CXR today. -Consider getting shingles at pharmacy. -Follow up for breast imaging as scheduled.     Enzo Bi, RN DNP [facility] at [facility]

## 2019-03-02 ENCOUNTER — Telehealth: Payer: Self-pay | Admitting: Internal Medicine

## 2019-03-02 NOTE — Telephone Encounter (Signed)
Patient states that she "does not feel right taking Losartan".   Patient would like to know if she can try a different medication for her hypertension? Lisinopril causes a cough. If this is possible she would like the new prescriptions sent to her mail order - [facility].

## 2019-03-02 NOTE — Telephone Encounter (Signed)
I am going to need some more information... what does "not feel right" mean?

## 2019-03-02 NOTE — Telephone Encounter (Signed)
Patient states that it make her cough in the morning and at night.  She states that she did not have the cough before taking Losartan and she does not want to take it.

## 2019-03-02 NOTE — Telephone Encounter (Signed)
Copied from [place] [id number]. Topic: Quick Communication - Rx Refill/Question >> Mar 02, 2019  9:25 AM Andria Frames L wrote: Medication: losartan (COZAAR) 25 MG tablet [[id number]]  pt stated that she would a call back from the nurse regarding medication. Pt states that she thinks that this medication is doing more harm than good. Please advise

## 2019-03-02 NOTE — Telephone Encounter (Signed)
Losartan does not cause cough

## 2019-03-02 NOTE — Telephone Encounter (Signed)
Losartan should not cause a cough. If she wants to stop it, that is of course her decision. Ask her to take BP at home a couple times a week and to notify us if she has values above 130/80.

## 2019-03-03 NOTE — Telephone Encounter (Signed)
Patient is aware and will call back as needed. 

## 2019-03-22 NOTE — Telephone Encounter (Signed)
Copied from [place] [id number]. Topic: General - Other >> Mar 22, 2019 12:32 PM Mcneil, Ja-Kwan wrote: Reason for CRM: Pt stated that she was asked to call in with her blood pressure readings. The first reading was 164/92 and the second reading was 163/96. Pt stated both blood pressure readings were on the left arm and they were about 15 minutes apart.

## 2019-03-23 NOTE — Telephone Encounter (Signed)
FYI - blood pressure readings

## 2019-03-23 NOTE — Telephone Encounter (Addendum)
Pt's daughter [name] called in to follow up on pt's request to change medication?  Her call back number is [phone]   Please advise pt.

## 2019-03-24 NOTE — Telephone Encounter (Signed)
Daughter not on DPR.  Called and spoke to patient and given verbal okay to speak with daughter.  attempted to call daughter, but unable to leave a message.  Offered patient a doxy.me visit, but patient declined at this time. CRM

## 2019-03-24 NOTE — Telephone Encounter (Signed)
Pt daughter [name] called back in to be advised. Daughter would like to re-inerate to the provider that pt is a stroke pt. Daughter says that this is an emergency and she feels that this should be handled urgently. Advised daughter that PCP is currently out of the office and that someone will get with them to advise as soon as possible.   Please advise

## 2019-03-30 NOTE — Telephone Encounter (Signed)
[name] returning missed call. Please call back to advise on patient's blood pressure medication.

## 2019-04-01 ENCOUNTER — Ambulatory Visit: Payer: Self-pay | Admitting: Internal Medicine

## 2019-04-01 ENCOUNTER — Ambulatory Visit (INDEPENDENT_AMBULATORY_CARE_PROVIDER_SITE_OTHER): Payer: Medicare HMO | Admitting: Internal Medicine

## 2019-04-01 ENCOUNTER — Other Ambulatory Visit: Payer: Self-pay

## 2019-04-01 DIAGNOSIS — I10 Essential (primary) hypertension: Secondary | ICD-10-CM | POA: Diagnosis not present

## 2019-04-01 MED ORDER — AMLODIPINE BESYLATE 5 MG PO TABS: 5.0000 mg | ORAL_TABLET | Freq: Every day | ORAL | 1 refills | Status: DC

## 2019-04-01 NOTE — Telephone Encounter (Signed)
Pt. Stopped her Losartan on her own in March. Has been monitoring her BP and reports it is "slowly creeping up." Today it is 151/106 and she has "a slight headache." Wants to know if Dr. Jerilee Hoh can try her on another medication. Unable to do a Web visit. Triage unable to get through to scheduler. Please advise pt.   Answer Assessment - Initial Assessment Questions 1. BLOOD PRESSURE: "What is the blood pressure?" "Did you take at least two measurements 5 minutes apart?"     151/106 2. ONSET: "When did you take your blood pressure?"     This morning 3. HOW: "How did you obtain the blood pressure?" (e.g., visiting nurse, automatic home BP monitor)     Home monitor 4. HISTORY: "Do you have a history of high blood pressure?"     Yes 5. MEDICATIONS: "Are you taking any medications for blood pressure?" "Have you missed any doses recently?"     Stopped Losartan in March  6. OTHER SYMPTOMS: "Do you have any symptoms?" (e.g., headache, chest pain, blurred vision, difficulty breathing, weakness)     A slight headache 7. PREGNANCY: "Is there any chance you are pregnant?" "When was your last menstrual period?"     No  Protocols used: HIGH BLOOD PRESSURE-A-AH

## 2019-04-01 NOTE — Telephone Encounter (Signed)
Telephone visit scheduled

## 2019-04-01 NOTE — Progress Notes (Signed)
Virtual Visit via Telephone Note  I connected with Danielle Atkins on 04/01/19 at  4:00 PM EDT by telephone and verified that I am speaking with the correct person using two identifiers.   I discussed the limitations, risks, security and privacy concerns of performing an evaluation and management service by telephone and the availability of in person appointments. I also discussed with the patient that there may be a patient responsible charge related to this service. The patient expressed understanding and agreed to proceed.  We initially attempted to connect via video chat but were unable to due to technical difficulties on the patient's end, so we converted this visit to a phone visit.   Location patient: home Location provider: work office Participants present for the call: patient, provider Patient did not have a visit in the prior 7 days to address this/these issue(s).   History of Present Illness:  She scheduled this visit due to concerns with HTN. She elected to DC losartan for reasons that are not 100% clear to me in March. She does not want to go back on it. She tells me her daughter is concerned about her being on it?  She was asked to monitor BP and notify us if they were creeping up. This am BP was 151/106. She does not have CP, HA, SOB or any other symptoms.   Observations/Objective: Patient sounds cheerful and well on the phone. I do not appreciate any increased work of breathing. Speech and thought processing are grossly intact. Patient reported vitals: BP 151/106   Current Outpatient Medications:  .  albuterol (PROAIR HFA) 108 (90 Base) MCG/ACT inhaler, Inhale 2 puffs into the lungs every 6 (six) hours as needed for wheezing., Disp: 18 g, Rfl: 2 .  allopurinol (ZYLOPRIM) 300 MG tablet, Take 1 tablet (300 mg total) by mouth daily., Disp: 90 tablet, Rfl: 1 .  amLODipine (NORVASC) 5 MG tablet, Take 1 tablet (5 mg total) by mouth daily., Disp: 90 tablet, Rfl: 1  .  aspirin 81 MG tablet, Take 1 tablet (81 mg total) by mouth daily., Disp: 30 tablet, Rfl:  .  famotidine (PEPCID) 40 MG tablet, Take 1 tablet (40 mg total) by mouth daily., Disp: 90 tablet, Rfl: 1 .  glucose blood test strip, 1 each by Other route daily as needed for other., Disp: 100 each, Rfl: 12 .  levothyroxine (SYNTHROID, LEVOTHROID) 50 MCG tablet, Take 0.5 tablets (25 mcg total) by mouth daily before breakfast., Disp: 90 tablet, Rfl: 1 .  metFORMIN (GLUCOPHAGE) 500 MG tablet, TAKE 1 TABLET EVERY DAY  WITH  BREAKFAST, Disp: 90 tablet, Rfl: 4 .  metFORMIN (GLUCOPHAGE) 500 MG tablet, TAKE 1 TABLET EVERY DAY  WITH  BREAKFAST, Disp: 90 tablet, Rfl: 1 .  Multiple Vitamin (MULTIVITAMIN WITH MINERALS) TABS, Take 1 tablet by mouth daily., Disp: , Rfl:  .  simvastatin (ZOCOR) 40 MG tablet, Take 1 tablet (40 mg total) by mouth daily., Disp: 90 tablet, Rfl: 1  Review of Systems:  Constitutional: Denies fever, chills, diaphoresis, appetite change and fatigue.  HEENT: Denies photophobia, eye pain, redness, hearing loss, ear pain, congestion, sore throat, rhinorrhea, sneezing, mouth sores, trouble swallowing, neck pain, neck stiffness and tinnitus.   Respiratory: Denies SOB, DOE, cough, chest tightness,  and wheezing.   Cardiovascular: Denies chest pain, palpitations and leg swelling.  Gastrointestinal: Denies nausea, vomiting, abdominal pain, diarrhea, constipation, blood in stool and abdominal distention.  Genitourinary: Denies dysuria, urgency, frequency, hematuria, flank pain and difficulty urinating.  Endocrine: Denies: hot or cold intolerance, sweats, changes in hair or nails, polyuria, polydipsia. Musculoskeletal: Denies myalgias, back pain, joint swelling, arthralgias and gait problem.  Skin: Denies pallor, rash and wound.  Neurological: Denies dizziness, seizures, syncope, weakness, light-headedness, numbness and headaches.  Hematological: Denies adenopathy. Easy bruising, personal or family  bleeding history  Psychiatric/Behavioral: Denies suicidal ideation, mood changes, confusion, nervousness, sleep disturbance and agitation   Assessment and Plan:  Essential hypertension -Patient has elected to go off losartan and has noted a trend of increasing BPs. -Will start amlodipine 5 mg daily. -She will continue ambulatory BP monitoring. -Follow up for BP in 4 weeks.    I discussed the assessment and treatment plan with the patient. The patient was provided an opportunity to ask questions and all were answered. The patient agreed with the plan and demonstrated an understanding of the instructions.   The patient was advised to call back or seek an in-person evaluation if the symptoms worsen or if the condition fails to improve as anticipated.  I provided 13 minutes of non-face-to-face time during this encounter.   Lelon Frohlich, MD [place] [facility] at [facility]

## 2019-04-01 NOTE — Telephone Encounter (Signed)
Visit to be scheduled for today

## 2019-04-26 ENCOUNTER — Other Ambulatory Visit: Payer: Self-pay

## 2019-04-26 ENCOUNTER — Ambulatory Visit (INDEPENDENT_AMBULATORY_CARE_PROVIDER_SITE_OTHER): Payer: Medicare HMO | Admitting: Internal Medicine

## 2019-04-26 DIAGNOSIS — I10 Essential (primary) hypertension: Secondary | ICD-10-CM | POA: Diagnosis not present

## 2019-04-26 NOTE — Progress Notes (Signed)
Virtual Visit via Telephone Note  I connected with Danielle Atkins on 04/26/19 at  9:00 AM EDT by telephone and verified that I am speaking with the correct person using two identifiers.   I discussed the limitations, risks, security and privacy concerns of performing an evaluation and management service by telephone and the availability of in person appointments. I also discussed with the patient that there may be a patient responsible charge related to this service. The patient expressed understanding and agreed to proceed.  We initially attempted to connect via video chat but were unable to due to technical difficulties on the patient's end, so we converted this visit to a phone visit.   Location patient: home Location provider: work office Participants present for the call: patient, provider Patient did not have a visit in the prior 7 days to address this/these issue(s).   History of Present Illness:  This is a scheduled visit for HTN followup after we started amlodipine 5 mg on 4/17. She has no acute complaints. BPs has ranged between 115-140/71-86. She has had no issues tolerating the new medication.   Observations/Objective: Patient sounds cheerful and well on the phone. I do not appreciate any increased work of breathing. Speech and thought processing are grossly intact. Patient reported vitals: BP 131/86 HR 102 this am   Current Outpatient Medications:  .  albuterol (PROAIR HFA) 108 (90 Base) MCG/ACT inhaler, Inhale 2 puffs into the lungs every 6 (six) hours as needed for wheezing., Disp: 18 g, Rfl: 2 .  allopurinol (ZYLOPRIM) 300 MG tablet, Take 1 tablet (300 mg total) by mouth daily., Disp: 90 tablet, Rfl: 1 .  amLODipine (NORVASC) 5 MG tablet, Take 1 tablet (5 mg total) by mouth daily., Disp: 90 tablet, Rfl: 1 .  aspirin 81 MG tablet, Take 1 tablet (81 mg total) by mouth daily., Disp: 30 tablet, Rfl:  .  famotidine (PEPCID) 40 MG tablet, Take 1 tablet (40 mg  total) by mouth daily., Disp: 90 tablet, Rfl: 1 .  glucose blood test strip, 1 each by Other route daily as needed for other., Disp: 100 each, Rfl: 12 .  levothyroxine (SYNTHROID, LEVOTHROID) 50 MCG tablet, Take 0.5 tablets (25 mcg total) by mouth daily before breakfast., Disp: 90 tablet, Rfl: 1 .  metFORMIN (GLUCOPHAGE) 500 MG tablet, TAKE 1 TABLET EVERY DAY  WITH  BREAKFAST, Disp: 90 tablet, Rfl: 4 .  metFORMIN (GLUCOPHAGE) 500 MG tablet, TAKE 1 TABLET EVERY DAY  WITH  BREAKFAST, Disp: 90 tablet, Rfl: 1 .  Multiple Vitamin (MULTIVITAMIN WITH MINERALS) TABS, Take 1 tablet by mouth daily., Disp: , Rfl:  .  simvastatin (ZOCOR) 40 MG tablet, Take 1 tablet (40 mg total) by mouth daily., Disp: 90 tablet, Rfl: 1  Review of Systems:  Constitutional: Denies fever, chills, diaphoresis, appetite change and fatigue.  HEENT: Denies photophobia, eye pain, redness, hearing loss, ear pain, congestion, sore throat, rhinorrhea, sneezing, mouth sores, trouble swallowing, neck pain, neck stiffness and tinnitus.   Respiratory: Denies SOB, DOE, cough, chest tightness,  and wheezing.   Cardiovascular: Denies chest pain, palpitations and leg swelling.  Gastrointestinal: Denies nausea, vomiting, abdominal pain, diarrhea, constipation, blood in stool and abdominal distention.  Genitourinary: Denies dysuria, urgency, frequency, hematuria, flank pain and difficulty urinating.  Endocrine: Denies: hot or cold intolerance, sweats, changes in hair or nails, polyuria, polydipsia. Musculoskeletal: Denies myalgias, back pain, joint swelling, arthralgias and gait problem.  Skin: Denies pallor, rash and wound.  Neurological: Denies dizziness, seizures,  syncope, weakness, light-headedness, numbness and headaches.  Hematological: Denies adenopathy. Easy bruising, personal or family bleeding history  Psychiatric/Behavioral: Denies suicidal ideation, mood changes, confusion, nervousness, sleep disturbance and agitation    Assessment and Plan:  Essential hypertension -Appears to be relatively well controlled. -Continue amlodipine 5 mg daily. -Follow up in office in 3 months.   I discussed the assessment and treatment plan with the patient. The patient was provided an opportunity to ask questions and all were answered. The patient agreed with the plan and demonstrated an understanding of the instructions.   The patient was advised to call back or seek an in-person evaluation if the symptoms worsen or if the condition fails to improve as anticipated.  I provided 12 minutes of non-face-to-face time during this encounter.   Lelon Frohlich, MD [place] [facility] at [facility]

## 2019-05-06 DIAGNOSIS — Z20828 Contact with and (suspected) exposure to other viral communicable diseases: Secondary | ICD-10-CM | POA: Diagnosis not present

## 2019-06-20 ENCOUNTER — Ambulatory Visit (INDEPENDENT_AMBULATORY_CARE_PROVIDER_SITE_OTHER): Payer: Medicare HMO | Admitting: Family Medicine

## 2019-06-20 ENCOUNTER — Other Ambulatory Visit: Payer: Self-pay

## 2019-06-20 ENCOUNTER — Encounter: Payer: Self-pay | Admitting: Family Medicine

## 2019-06-20 DIAGNOSIS — R42 Dizziness and giddiness: Secondary | ICD-10-CM

## 2019-06-20 MED ORDER — MECLIZINE HCL 12.5 MG PO TABS: 12.5000 mg | ORAL_TABLET | Freq: Three times a day (TID) | ORAL | 0 refills | Status: DC | PRN

## 2019-06-20 NOTE — Progress Notes (Signed)
Virtual Visit via Telephone Note  I connected with Danielle Atkins on 06/20/19 at  2:30 PM EDT by telephone and verified that I am speaking with the correct person using two identifiers.   I discussed the limitations, risks, security and privacy concerns of performing an evaluation and management service by telephone and the availability of in person appointments. I also discussed with the patient that there may be a patient responsible charge related to this service. The patient expressed understanding and agreed to proceed.  Location patient: home Location provider: work or home office Participants present for the call: patient, provider Patient did not have a visit in the prior 7 days to address this/these issue(s).   History of Present Illness: Pt is an 84 yo female with pmh sig for HTN, PUD, DM HLD, gout.    Pt with dizziness since last Thursday.  Notes some improvement in symptoms, but her head starts spinning with laying down or sitting down.  Pt feels like she is moving.  BP has been normal 123/69.  Pt drinking "a lot of water" daily.  States had vertigo 1.5 months ago that resolved with exercises.   Denies HAs, changes in vision, emesis.   Observations/Objective: Patient sounds cheerful and well on the phone. I do not appreciate any SOB. Speech and thought processing are grossly intact. Patient reported vitals:  Assessment and Plan: Vertigo  -continue exercises -hydration encouraged. -pt to take extra time when moving from laying down to sitting to standing positions -for continued symptoms consider vestibular rehab and imaging  -given precautions due to age and use of meclizine. - Plan: meclizine (ANTIVERT) 12.5 MG tablet   Follow Up Instructions: F/u prn  [id number] [id number] [id number] [id number] 21-30 I did not refer this patient for an OV in the next 24 hours for this/these issue(s).  I discussed the assessment and treatment plan with the patient. The patient was provided  an opportunity to ask questions and all were answered. The patient agreed with the plan and demonstrated an understanding of the instructions.   The patient was advised to call back or seek an in-person evaluation if the symptoms worsen or if the condition fails to improve as anticipated.  I provided 8 minutes of non-face-to-face time during this encounter.   Billie Ruddy, MD

## 2019-06-29 DIAGNOSIS — H02825 Cysts of left lower eyelid: Secondary | ICD-10-CM | POA: Diagnosis not present

## 2019-06-29 DIAGNOSIS — H04123 Dry eye syndrome of bilateral lacrimal glands: Secondary | ICD-10-CM | POA: Diagnosis not present

## 2019-06-29 DIAGNOSIS — H40013 Open angle with borderline findings, low risk, bilateral: Secondary | ICD-10-CM | POA: Diagnosis not present

## 2019-06-29 DIAGNOSIS — Z961 Presence of intraocular lens: Secondary | ICD-10-CM | POA: Diagnosis not present

## 2019-06-29 DIAGNOSIS — E119 Type 2 diabetes mellitus without complications: Secondary | ICD-10-CM | POA: Diagnosis not present

## 2019-06-29 LAB — HM DIABETES EYE EXAM

## 2019-07-25 ENCOUNTER — Other Ambulatory Visit: Payer: Self-pay | Admitting: Internal Medicine

## 2019-07-25 DIAGNOSIS — I10 Essential (primary) hypertension: Secondary | ICD-10-CM

## 2019-08-18 ENCOUNTER — Ambulatory Visit
Admission: RE | Admit: 2019-08-18 | Discharge: 2019-08-18 | Disposition: A | Discharge status: Home / Self Care | Payer: Medicare HMO | Source: Ambulatory Visit | Attending: Internal Medicine | Admitting: Internal Medicine

## 2019-08-18 ENCOUNTER — Other Ambulatory Visit: Payer: Self-pay

## 2019-08-18 DIAGNOSIS — N6001 Solitary cyst of right breast: Secondary | ICD-10-CM | POA: Diagnosis not present

## 2019-08-18 DIAGNOSIS — R928 Other abnormal and inconclusive findings on diagnostic imaging of breast: Secondary | ICD-10-CM | POA: Diagnosis not present

## 2019-08-18 DIAGNOSIS — N6310 Unspecified lump in the right breast, unspecified quadrant: Secondary | ICD-10-CM

## 2019-09-01 ENCOUNTER — Other Ambulatory Visit: Payer: Self-pay

## 2019-09-01 ENCOUNTER — Encounter: Payer: Self-pay | Admitting: Internal Medicine

## 2019-09-01 ENCOUNTER — Other Ambulatory Visit: Payer: Self-pay | Admitting: Internal Medicine

## 2019-09-01 ENCOUNTER — Ambulatory Visit (INDEPENDENT_AMBULATORY_CARE_PROVIDER_SITE_OTHER)
Admission: RE | Admit: 2019-09-01 | Discharge: 2019-09-01 | Disposition: A | Discharge status: Home / Self Care | Payer: Medicare HMO | Source: Ambulatory Visit | Attending: Internal Medicine | Admitting: Internal Medicine

## 2019-09-01 ENCOUNTER — Ambulatory Visit (INDEPENDENT_AMBULATORY_CARE_PROVIDER_SITE_OTHER): Payer: Medicare HMO | Admitting: Internal Medicine

## 2019-09-01 VITALS — BP 110/80 | HR 97 | Temp 97.8°F | Ht 64.0 in | Wt 175.6 lb

## 2019-09-01 DIAGNOSIS — M1A9XX0 Chronic gout, unspecified, without tophus (tophi): Secondary | ICD-10-CM | POA: Diagnosis not present

## 2019-09-01 DIAGNOSIS — I10 Essential (primary) hypertension: Secondary | ICD-10-CM | POA: Diagnosis not present

## 2019-09-01 DIAGNOSIS — K279 Peptic ulcer, site unspecified, unspecified as acute or chronic, without hemorrhage or perforation: Secondary | ICD-10-CM

## 2019-09-01 DIAGNOSIS — Z780 Asymptomatic menopausal state: Secondary | ICD-10-CM

## 2019-09-01 DIAGNOSIS — Z23 Encounter for immunization: Secondary | ICD-10-CM | POA: Diagnosis not present

## 2019-09-01 DIAGNOSIS — Z0000 Encounter for general adult medical examination without abnormal findings: Secondary | ICD-10-CM | POA: Diagnosis not present

## 2019-09-01 DIAGNOSIS — Z13820 Encounter for screening for osteoporosis: Secondary | ICD-10-CM

## 2019-09-01 DIAGNOSIS — N183 Chronic kidney disease, stage 3 unspecified: Secondary | ICD-10-CM

## 2019-09-01 DIAGNOSIS — E039 Hypothyroidism, unspecified: Secondary | ICD-10-CM

## 2019-09-01 DIAGNOSIS — H9193 Unspecified hearing loss, bilateral: Secondary | ICD-10-CM

## 2019-09-01 DIAGNOSIS — D509 Iron deficiency anemia, unspecified: Secondary | ICD-10-CM

## 2019-09-01 DIAGNOSIS — E1121 Type 2 diabetes mellitus with diabetic nephropathy: Secondary | ICD-10-CM

## 2019-09-01 DIAGNOSIS — E7800 Pure hypercholesterolemia, unspecified: Secondary | ICD-10-CM

## 2019-09-01 DIAGNOSIS — E0821 Diabetes mellitus due to underlying condition with diabetic nephropathy: Secondary | ICD-10-CM

## 2019-09-01 HISTORY — DX: Chronic kidney disease, stage 3 unspecified: N18.30

## 2019-09-01 LAB — CBC WITH DIFFERENTIAL/PLATELET
Basophils Absolute: 0 10*3/uL (ref 0.0–0.1)
Basophils Relative: 0.3 % (ref 0.0–3.0)
Eosinophils Absolute: 0.4 10*3/uL (ref 0.0–0.7)
Eosinophils Relative: 5 % (ref 0.0–5.0)
HCT: 35.1 % — ABNORMAL LOW (ref 36.0–46.0)
Hemoglobin: 10.8 g/dL — ABNORMAL LOW (ref 12.0–15.0)
Lymphocytes Relative: 26.7 % (ref 12.0–46.0)
Lymphs Abs: 2 10*3/uL (ref 0.7–4.0)
MCHC: 30.7 g/dL (ref 30.0–36.0)
MCV: 69.1 fl — ABNORMAL LOW (ref 78.0–100.0)
Monocytes Absolute: 0.5 10*3/uL (ref 0.1–1.0)
Monocytes Relative: 7.1 % (ref 3.0–12.0)
Neutro Abs: 4.6 10*3/uL (ref 1.4–7.7)
Neutrophils Relative %: 60.9 % (ref 43.0–77.0)
Platelets: 197 10*3/uL (ref 150.0–400.0)
RBC: 5.08 Mil/uL (ref 3.87–5.11)
RDW: 19.6 % — ABNORMAL HIGH (ref 11.5–15.5)
WBC: 7.6 10*3/uL (ref 4.0–10.5)

## 2019-09-01 LAB — LIPID PANEL
Cholesterol: 132 mg/dL (ref 0–200)
HDL: 42.2 mg/dL (ref >39.00)
LDL Cholesterol: 70 mg/dL (ref 0–99)
NonHDL: 90.19
Total CHOL/HDL Ratio: 3
Triglycerides: 103 mg/dL (ref 0.0–149.0)
VLDL: 20.6 mg/dL (ref 0.0–40.0)

## 2019-09-01 LAB — COMPREHENSIVE METABOLIC PANEL
ALT: 10 U/L (ref 0–35)
AST: 20 U/L (ref 0–37)
Albumin: 4.1 g/dL (ref 3.5–5.2)
Alkaline Phosphatase: 59 U/L (ref 39–117)
BUN: 18 mg/dL (ref 6–23)
CO2: 25 mEq/L (ref 19–32)
Calcium: 9.6 mg/dL (ref 8.4–10.5)
Chloride: 104 mEq/L (ref 96–112)
Creatinine, Ser: 1.28 mg/dL — ABNORMAL HIGH (ref 0.40–1.20)
GFR: 48.24 mL/min — ABNORMAL LOW (ref >60.00)
Glucose, Bld: 103 mg/dL — ABNORMAL HIGH (ref 70–99)
Potassium: 3.6 mEq/L (ref 3.5–5.1)
Sodium: 140 mEq/L (ref 135–145)
Total Bilirubin: 1 mg/dL (ref 0.2–1.2)
Total Protein: 7.9 g/dL (ref 6.0–8.3)

## 2019-09-01 LAB — VITAMIN D 25 HYDROXY (VIT D DEFICIENCY, FRACTURES): VITD: 40.76 ng/mL (ref 30.00–100.00)

## 2019-09-01 LAB — VITAMIN B12: Vitamin B-12: 915 pg/mL — ABNORMAL HIGH (ref 211–911)

## 2019-09-01 LAB — HEMOGLOBIN A1C: Hgb A1c MFr Bld: 6.5 % (ref 4.6–6.5)

## 2019-09-01 LAB — TSH: TSH: 4.69 u[IU]/mL — ABNORMAL HIGH (ref 0.35–4.50)

## 2019-09-01 MED ORDER — LEVOTHYROXINE SODIUM 50 MCG PO TABS: 50.0000 ug | ORAL_TABLET | Freq: Every day | ORAL | 1 refills | Status: DC

## 2019-09-01 NOTE — Addendum Note (Signed)
Addended by: Westley Hummer B on: 09/01/2019 07:38 AM   Modules accepted: Orders

## 2019-09-01 NOTE — Patient Instructions (Signed)
-Nice seeing you today!!  -Lab work today; will notify you once results are available.  -Flu vaccine today.  -Remember to get tetanus and shingles vaccinations at your pharmacy.  -Please make sure you see a dentist twice a year.  -Bone density test will be scheduled.  -Schedule follow up in 3 months.   Preventive Care 82 Years and Older, Female Preventive care refers to lifestyle choices and visits with your health care provider that can promote health and wellness. This includes:  A yearly physical exam. This is also called an annual well check.  Regular dental and eye exams.  Immunizations.  Screening for certain conditions.  Healthy lifestyle choices, such as diet and exercise. What can I expect for my preventive care visit? Physical exam Your health care provider will check:  Height and weight. These may be used to calculate body mass index (BMI), which is a measurement that tells if you are at a healthy weight.  Heart rate and blood pressure.  Your skin for abnormal spots. Counseling Your health care provider may ask you questions about:  Alcohol, tobacco, and drug use.  Emotional well-being.  Home and relationship well-being.  Sexual activity.  Eating habits.  History of falls.  Memory and ability to understand (cognition).  Work and work Statistician.  Pregnancy and menstrual history. What immunizations do I need?  Influenza (flu) vaccine  This is recommended every year. Tetanus, diphtheria, and pertussis (Tdap) vaccine  You may need a Td booster every 10 years. Varicella (chickenpox) vaccine  You may need this vaccine if you have not already been vaccinated. Zoster (shingles) vaccine  You may need this after age 82. Pneumococcal conjugate (PCV13) vaccine  One dose is recommended after age 82. Pneumococcal polysaccharide (PPSV23) vaccine  One dose is recommended after age 82. Measles, mumps, and rubella (MMR) vaccine  You may need at  least one dose of MMR if you were born in 1957 or later. You may also need a second dose. Meningococcal conjugate (MenACWY) vaccine  You may need this if you have certain conditions. Hepatitis A vaccine  You may need this if you have certain conditions or if you travel or work in places where you may be exposed to hepatitis A. Hepatitis B vaccine  You may need this if you have certain conditions or if you travel or work in places where you may be exposed to hepatitis B. Haemophilus influenzae type b (Hib) vaccine  You may need this if you have certain conditions. You may receive vaccines as individual doses or as more than one vaccine together in one shot (combination vaccines). Talk with your health care provider about the risks and benefits of combination vaccines. What tests do I need? Blood tests  Lipid and cholesterol levels. These may be checked every 5 years, or more frequently depending on your overall health.  Hepatitis C test.  Hepatitis B test. Screening  Lung cancer screening. You may have this screening every year starting at age 82 if you have a 30-pack-year history of smoking and currently smoke or have quit within the past 15 years.  Colorectal cancer screening. All adults should have this screening starting at age 82 and continuing until age 32. Your health care provider may recommend screening at age 82 if you are at increased risk. You will have tests every 1-10 years, depending on your results and the type of screening test.  Diabetes screening. This is done by checking your blood sugar (glucose) after you have not  eaten for a while (fasting). You may have this done every 1-3 years.  Mammogram. This may be done every 1-2 years. Talk with your health care provider about how often you should have regular mammograms.  BRCA-related cancer screening. This may be done if you have a family history of breast, ovarian, tubal, or peritoneal cancers. Other tests  Sexually  transmitted disease (STD) testing.  Bone density scan. This is done to screen for osteoporosis. You may have this done starting at age 82. Follow these instructions at home: Eating and drinking  Eat a diet that includes fresh fruits and vegetables, whole grains, lean protein, and low-fat dairy products. Limit your intake of foods with high amounts of sugar, saturated fats, and salt.  Take vitamin and mineral supplements as recommended by your health care provider.  Do not drink alcohol if your health care provider tells you not to drink.  If you drink alcohol: ? Limit how much you have to 0-1 drink a day. ? Be aware of how much alcohol is in your drink. In the [place] one drink equals one 12 oz bottle of beer (355 mL), one 5 oz glass of wine (148 mL), or one 1 oz glass of hard liquor (44 mL). Lifestyle  Take daily care of your teeth and gums.  Stay active. Exercise for at least 30 minutes on 5 or more days each week.  Do not use any products that contain nicotine or tobacco, such as cigarettes, e-cigarettes, and chewing tobacco. If you need help quitting, ask your health care provider.  If you are sexually active, practice safe sex. Use a condom or other form of protection in order to prevent STIs (sexually transmitted infections).  Talk with your health care provider about taking a low-dose aspirin or statin. What's next?  Go to your health care provider once a year for a well check visit.  Ask your health care provider how often you should have your eyes and teeth checked.  Stay up to date on all vaccines. This information is not intended to replace advice given to you by your health care provider. Make sure you discuss any questions you have with your health care provider. Document Released: 12/28/2015 Document Revised: 11/25/2018 Document Reviewed: 11/25/2018 Elsevier Patient Education  2020 [employer].

## 2019-09-01 NOTE — Progress Notes (Addendum)
Established Patient Office Visit     CC/Reason for Visit: Annual preventive exam and subsequent Medicare wellness visit  HPI: Danielle Atkins is a 84 y.o. female who is coming in today for the above mentioned reasons. Past Medical History is significant for: Gout, peptic ulcer disease, prior history of TIA, well-controlled diabetes, well-controlled hypertension, hypothyroidism, hyperlipidemia and chronic kidney disease.  She has no acute complaints today.  She looks much younger than her stated age.  She has routine eye care, needs a dentist.  She has noticed more issues with her hearing.  She walks every morning.  She is due for tetanus, flu and shingles vaccinations.  In regards to his cancer screening she would like to defer colon cancer and uterine cancer screening, however she would like to continue with annual mammograms for breast cancer screening due to family history.   Past Medical/Surgical History: Past Medical History:  Diagnosis Date   ANEMIA-NOS 06/25/2007   GOUT 11/16/2009   PARESTHESIA, HANDS 03/27/2009   PEPTIC ULCER DISEASE 06/25/2007   RENAL INSUFFICIENCY 06/25/2007   TRANSIENT ISCHEMIC ATTACK, HX OF 06/25/2007    No past surgical history on file.  Social History:  reports that she quit smoking about 19 years ago. She has never used smokeless tobacco. She reports that she does not drink alcohol or use drugs.  Allergies: No Known Allergies  Family History:  No history of heart disease, cancer, stroke that she is aware of.  Current Outpatient Medications:    albuterol (PROAIR HFA) 108 (90 Base) MCG/ACT inhaler, Inhale 2 puffs into the lungs every 6 (six) hours as needed for wheezing., Disp: 18 g, Rfl: 2   allopurinol (ZYLOPRIM) 300 MG tablet, TAKE 1 TABLET EVERY DAY, Disp: 90 tablet, Rfl: 1   amLODipine (NORVASC) 5 MG tablet, Take 1 tablet (5 mg total) by mouth daily., Disp: 90 tablet, Rfl: 1   aspirin 81 MG tablet, Take 1 tablet (81 mg total) by  mouth daily., Disp: 30 tablet, Rfl:    famotidine (PEPCID) 40 MG tablet, Take 1 tablet (40 mg total) by mouth daily., Disp: 90 tablet, Rfl: 1   glucose blood test strip, 1 each by Other route daily as needed for other., Disp: 100 each, Rfl: 12   levothyroxine (SYNTHROID) 50 MCG tablet, Take 1 tablet (50 mcg total) by mouth daily before breakfast., Disp: 90 tablet, Rfl: 1   meclizine (ANTIVERT) 12.5 MG tablet, Take 1 tablet (12.5 mg total) by mouth 3 (three) times daily as needed for dizziness., Disp: 30 tablet, Rfl: 0   metFORMIN (GLUCOPHAGE) 500 MG tablet, TAKE 1 TABLET EVERY DAY  WITH  BREAKFAST, Disp: 90 tablet, Rfl: 4   Multiple Vitamin (MULTIVITAMIN WITH MINERALS) TABS, Take 1 tablet by mouth daily., Disp: , Rfl:    simvastatin (ZOCOR) 40 MG tablet, Take 1 tablet (40 mg total) by mouth daily., Disp: 90 tablet, Rfl: 1  Review of Systems:  Constitutional: Denies fever, chills, diaphoresis, appetite change and fatigue.  HEENT: Denies photophobia, eye pain, redness, hearing loss, ear pain, congestion, sore throat, rhinorrhea, sneezing, mouth sores, trouble swallowing, neck pain, neck stiffness and tinnitus.   Respiratory: Denies SOB, DOE, cough, chest tightness,  and wheezing.   Cardiovascular: Denies chest pain, palpitations and leg swelling.  Gastrointestinal: Denies nausea, vomiting, abdominal pain, diarrhea, constipation, blood in stool and abdominal distention.  Genitourinary: Denies dysuria, urgency, frequency, hematuria, flank pain and difficulty urinating.  Endocrine: Denies: hot or cold intolerance, sweats, changes in hair or  nails, polyuria, polydipsia. Musculoskeletal: Denies myalgias, back pain, joint swelling, arthralgias and gait problem.  Skin: Denies pallor, rash and wound.  Neurological: Denies dizziness, seizures, syncope, weakness, light-headedness, numbness and headaches.  Hematological: Denies adenopathy. Easy bruising, personal or family bleeding history    Psychiatric/Behavioral: Denies suicidal ideation, mood changes, confusion, nervousness, sleep disturbance and agitation    Physical Exam: Vitals:   09/01/19 0708  BP: 110/80  Pulse: 97  Temp: 97.8 F (36.6 C)  TempSrc: Temporal  SpO2: 94%  Weight: 175 lb 9.6 oz (79.7 kg)  Height: _0  (1.626 m)    Body mass index is 30.14 kg/m.   Constitutional: NAD, calm, comfortable Eyes: PERRL, lids and conjunctivae normal ENMT: Mucous membranes are moist.Tympanic membrane is pearly white, no erythema or bulging. Neck: normal, supple, no masses, no thyromegaly Respiratory: clear to auscultation bilaterally, no wheezing, no crackles. Normal respiratory effort. No accessory muscle use.  Cardiovascular: Regular rate and rhythm, no murmurs / rubs / gallops. No extremity edema. 2+ pedal pulses. No carotid bruits.  Abdomen: no tenderness, no masses palpated. No hepatosplenomegaly. Bowel sounds positive.  Musculoskeletal: no clubbing / cyanosis. No joint deformity upper and lower extremities. Good ROM, no contractures. Normal muscle tone.  Skin: no rashes, lesions, ulcers. No induration Neurologic: CN 2-12 grossly intact. Sensation intact, DTR normal. Strength 5/5 in all 4.  Psychiatric: Normal judgment and insight. Alert and oriented x 3. Normal mood.    Subsequent Medicare wellness visit   1. Risk factors, based on past  M,S,F -cardiovascular disease risk factors include age, history of hypertension, history of diabetes, history of hyperlipidemia   2.  Physical activities: She walks about a mile every morning   3.  Depression/mood:  Stable, not depressed   4.  Hearing:  Has been noticing increasing issues with hearing, requesting audiology referral   5.  ADL's: Independent in all ADLs   6.  Fall risk:  Low fall risk   7.  Home safety: No problems identified   8.  Height weight, and visual acuity: Height and weight as above, visual acuity is 20/25 with the left eye, 20/32 with the  right eye and 20/25 with both eyes   9.  Counseling:  Advised to secure dental care, will get tetanus and shingles vaccinations from her pharmacy   10. Lab orders based on risk factors: Laboratory update will be reviewed   11. Referral :  Audiology   12. Care plan:  Follow-up with me in 3 months   13. Cognitive assessment:  No cognitive impairment   14. Screening: Patient provided with a written and personalized 5-10 year screening schedule in the AVS.   yes   15. Provider List Update:   PCP only  16. Advance Directives: Full code     Office Visit from 09/01/2019 in [facility]  PHQ-9 Total Score  2      Fall Risk  09/01/2019 03/01/2019 01/02/2018 10/02/2015 07/12/2013  Falls in the past year? 0 0 No No No  Number falls in past yr: 0 0 - - -  Injury with Fall? 0 0 - - -     Impression and Plan:  Encounter for preventive health examination  -She has routine eye care, advised routine dental care. -She is due for flu, tetanus and shingles vaccinations.  Will receive flu in office today, will go to pharmacy to get shingles and tetanus. -Healthy lifestyle has been discussed in detail today. -Screening labs to be performed today. -  She would like to continue annual mammograms, has elected to defer colon and cervical cancer screening due to age, I agree. -DEXA scan requested.  Essential hypertension -Well-controlled on current medications.  PUD (peptic ulcer disease)  -On H2 blockers, well controlled.  Diabetes mellitus with diabetic nephropathy, without long-term current use of insulin ([place])  -Most recent A1c was 5.7 in March 2020. -Recheck A1c today, continue metformin.  Chronic gout without tophus, unspecified cause, unspecified site -No recent flareups, continue allopurinol  Pure hypercholesterolemia -Last LDL was 60 in March 2019.  She is on simvastatin 40 mg daily. -Recheck lipids today.    Patient Instructions  -Nice seeing you  today!!  -Lab work today; will notify you once results are available.  -Flu vaccine today.  -Remember to get tetanus and shingles vaccinations at your pharmacy.  -Please make sure you see a dentist twice a year.  -Bone density test will be scheduled.  -Schedule follow up in 3 months.   Preventive Care 65 Years and Older, Female Preventive care refers to lifestyle choices and visits with your health care provider that can promote health and wellness. This includes:  A yearly physical exam. This is also called an annual well check.  Regular dental and eye exams.  Immunizations.  Screening for certain conditions.  Healthy lifestyle choices, such as diet and exercise. What can I expect for my preventive care visit? Physical exam Your health care provider will check:  Height and weight. These may be used to calculate body mass index (BMI), which is a measurement that tells if you are at a healthy weight.  Heart rate and blood pressure.  Your skin for abnormal spots. Counseling Your health care provider may ask you questions about:  Alcohol, tobacco, and drug use.  Emotional well-being.  Home and relationship well-being.  Sexual activity.  Eating habits.  History of falls.  Memory and ability to understand (cognition).  Work and work Statistician.  Pregnancy and menstrual history. What immunizations do I need?  Influenza (flu) vaccine  This is recommended every year. Tetanus, diphtheria, and pertussis (Tdap) vaccine  You may need a Td booster every 10 years. Varicella (chickenpox) vaccine  You may need this vaccine if you have not already been vaccinated. Zoster (shingles) vaccine  You may need this after age 63. Pneumococcal conjugate (PCV13) vaccine  One dose is recommended after age 71. Pneumococcal polysaccharide (PPSV23) vaccine  One dose is recommended after age 31. Measles, mumps, and rubella (MMR) vaccine  You may need at least one dose  of MMR if you were born in 1957 or later. You may also need a second dose. Meningococcal conjugate (MenACWY) vaccine  You may need this if you have certain conditions. Hepatitis A vaccine  You may need this if you have certain conditions or if you travel or work in places where you may be exposed to hepatitis A. Hepatitis B vaccine  You may need this if you have certain conditions or if you travel or work in places where you may be exposed to hepatitis B. Haemophilus influenzae type b (Hib) vaccine  You may need this if you have certain conditions. You may receive vaccines as individual doses or as more than one vaccine together in one shot (combination vaccines). Talk with your health care provider about the risks and benefits of combination vaccines. What tests do I need? Blood tests  Lipid and cholesterol levels. These may be checked every 5 years, or more frequently depending on your overall health.  Hepatitis C test.  Hepatitis B test. Screening  Lung cancer screening. You may have this screening every year starting at age 27 if you have a 30-pack-year history of smoking and currently smoke or have quit within the past 15 years.  Colorectal cancer screening. All adults should have this screening starting at age 62 and continuing until age 52. Your health care provider may recommend screening at age 70 if you are at increased risk. You will have tests every 1-10 years, depending on your results and the type of screening test.  Diabetes screening. This is done by checking your blood sugar (glucose) after you have not eaten for a while (fasting). You may have this done every 1-3 years.  Mammogram. This may be done every 1-2 years. Talk with your health care provider about how often you should have regular mammograms.  BRCA-related cancer screening. This may be done if you have a family history of breast, ovarian, tubal, or peritoneal cancers. Other tests  Sexually transmitted  disease (STD) testing.  Bone density scan. This is done to screen for osteoporosis. You may have this done starting at age 29. Follow these instructions at home: Eating and drinking  Eat a diet that includes fresh fruits and vegetables, whole grains, lean protein, and low-fat dairy products. Limit your intake of foods with high amounts of sugar, saturated fats, and salt.  Take vitamin and mineral supplements as recommended by your health care provider.  Do not drink alcohol if your health care provider tells you not to drink.  If you drink alcohol: ? Limit how much you have to 0-1 drink a day. ? Be aware of how much alcohol is in your drink. In the [place] one drink equals one 12 oz bottle of beer (355 mL), one 5 oz glass of wine (148 mL), or one 1 oz glass of hard liquor (44 mL). Lifestyle  Take daily care of your teeth and gums.  Stay active. Exercise for at least 30 minutes on 5 or more days each week.  Do not use any products that contain nicotine or tobacco, such as cigarettes, e-cigarettes, and chewing tobacco. If you need help quitting, ask your health care provider.  If you are sexually active, practice safe sex. Use a condom or other form of protection in order to prevent STIs (sexually transmitted infections).  Talk with your health care provider about taking a low-dose aspirin or statin. What's next?  Go to your health care provider once a year for a well check visit.  Ask your health care provider how often you should have your eyes and teeth checked.  Stay up to date on all vaccines. This information is not intended to replace advice given to you by your health care provider. Make sure you discuss any questions you have with your health care provider. Document Released: 12/28/2015 Document Revised: 11/25/2018 Document Reviewed: 11/25/2018 Elsevier Patient Education  2020 [place], MD [facility]

## 2019-09-01 NOTE — Addendum Note (Signed)
Addended by: Elmer Picker on: 09/01/2019 07:38 AM   Modules accepted: Orders

## 2019-09-02 ENCOUNTER — Other Ambulatory Visit (INDEPENDENT_AMBULATORY_CARE_PROVIDER_SITE_OTHER): Payer: Medicare HMO

## 2019-09-02 ENCOUNTER — Other Ambulatory Visit: Payer: Self-pay | Admitting: Internal Medicine

## 2019-09-02 DIAGNOSIS — D509 Iron deficiency anemia, unspecified: Secondary | ICD-10-CM | POA: Diagnosis not present

## 2019-09-02 DIAGNOSIS — N183 Chronic kidney disease, stage 3 unspecified: Secondary | ICD-10-CM

## 2019-09-02 DIAGNOSIS — E039 Hypothyroidism, unspecified: Secondary | ICD-10-CM

## 2019-09-02 LAB — IBC PANEL
Iron: 60 ug/dL (ref 42–145)
Saturation Ratios: 17.1 % — ABNORMAL LOW (ref 20.0–50.0)
Transferrin: 251 mg/dL (ref 212.0–360.0)

## 2019-09-15 ENCOUNTER — Ambulatory Visit: Payer: Self-pay | Admitting: *Deleted

## 2019-09-15 NOTE — Telephone Encounter (Signed)
Pt complains of severe left leg pain described as aching and constant that started  10/13/2019; she has taken pain pills rapid release Tylenol (400 mg x 2 on 10/29, and 400 x 3 10/30); the pain is on the outside of her lower leg, and worsens at night; she rates her pain at 6 out of 10 now, but 10 out of 10 at night; recommendations made per nurse triage protocol; she verbalized understanding; explained that the office is not yet open, and this information will be routed to them; also explained that since this is time sensitive, she may be directed to the ED; she again verbalized  Understanding; the pt can be contacted at [phone]; she sees Dr Jerilee Hoh, [facility]; will route for notification.   Reason for Disposition . [1] Thigh or calf pain AND [2] only 1 side AND [3] present > 1 hour  Answer Assessment - Initial Assessment Questions 1. ONSET: "When did the pain start?"      10/13/2019 2. LOCATION: "Where is the pain located?"      Outside of left lower leg 3. PAIN: "How bad is the pain?"    (Scale 1-10; or mild, moderate, severe)   -  MILD (1-3): doesn't interfere with normal activities    -  MODERATE (4-7): interferes with normal activities (e.g., work or school) or awakens from sleep, limping    -  SEVERE (8-10): excruciating pain, unable to do any normal activities, unable to walk 6-10 out of 10; worse at night 4. WORK OR EXERCISE: "Has there been any recent work or exercise that involved this part of the body?"     no 5. CAUSE: "What do you think is causing the leg pain?"     Not sure 6. OTHER SYMPTOMS: "Do you have any other symptoms?" (e.g., chest pain, back pain, breathing difficulty, swelling, rash, fever, numbness, weakness)    no 7. PREGNANCY: "Is there any chance you are pregnant?" "When was your last menstrual period?"     no  Protocols used: LEG PAIN-A-AH

## 2019-09-15 NOTE — Telephone Encounter (Signed)
Appointment scheduled per Dr Jerilee Hoh

## 2019-09-15 NOTE — Telephone Encounter (Signed)
Please advise on recommendations.

## 2019-09-16 ENCOUNTER — Ambulatory Visit (INDEPENDENT_AMBULATORY_CARE_PROVIDER_SITE_OTHER): Payer: Medicare HMO | Admitting: Internal Medicine

## 2019-09-16 ENCOUNTER — Other Ambulatory Visit: Payer: Self-pay

## 2019-09-16 ENCOUNTER — Encounter: Payer: Self-pay | Admitting: Internal Medicine

## 2019-09-16 VITALS — BP 110/80 | HR 101 | Temp 97.6°F | Wt 176.0 lb

## 2019-09-16 DIAGNOSIS — M5432 Sciatica, left side: Secondary | ICD-10-CM

## 2019-09-16 NOTE — Progress Notes (Signed)
Acute Office Visit     CC/Reason for Visit: left leg pain  HPI: Danielle Atkins is a 84 y.o. female who is coming in today for the above mentioned reasons.  She has been having left leg pain for the past 4 days.  She states it is worse at night.  It starts in her left outside lower leg right above her ankle but radiates all the way up the side and back of her thigh and into her buttocks.  She has had sciatica in the past.  This feels the same.  She has been using 2 extra strength Tylenol a day without much improvement.  She wants to make sure she does not have a blood clot.  Past Medical/Surgical History: Past Medical History:  Diagnosis Date  . ANEMIA-NOS 06/25/2007  . GOUT 11/16/2009  . PARESTHESIA, HANDS 03/27/2009  . PEPTIC ULCER DISEASE 06/25/2007  . RENAL INSUFFICIENCY 06/25/2007  . TRANSIENT ISCHEMIC ATTACK, HX OF 06/25/2007    No past surgical history on file.  Social History:  reports that she quit smoking about 19 years ago. She has never used smokeless tobacco. She reports that she does not drink alcohol or use drugs.  Allergies: No Known Allergies  Family History:  No history of heart disease, cancer, stroke that she is aware of  Current Outpatient Medications:  .  albuterol (PROAIR HFA) 108 (90 Base) MCG/ACT inhaler, Inhale 2 puffs into the lungs every 6 (six) hours as needed for wheezing., Disp: 18 g, Rfl: 2 .  allopurinol (ZYLOPRIM) 300 MG tablet, TAKE 1 TABLET EVERY DAY, Disp: 90 tablet, Rfl: 1 .  amLODipine (NORVASC) 5 MG tablet, Take 1 tablet (5 mg total) by mouth daily., Disp: 90 tablet, Rfl: 1 .  aspirin 81 MG tablet, Take 1 tablet (81 mg total) by mouth daily., Disp: 30 tablet, Rfl:  .  famotidine (PEPCID) 40 MG tablet, Take 1 tablet (40 mg total) by mouth daily., Disp: 90 tablet, Rfl: 1 .  glucose blood test strip, 1 each by Other route daily as needed for other., Disp: 100 each, Rfl: 12 .  levothyroxine (SYNTHROID) 50 MCG tablet, Take 1 tablet (50  mcg total) by mouth daily before breakfast., Disp: 90 tablet, Rfl: 1 .  meclizine (ANTIVERT) 12.5 MG tablet, Take 1 tablet (12.5 mg total) by mouth 3 (three) times daily as needed for dizziness., Disp: 30 tablet, Rfl: 0 .  metFORMIN (GLUCOPHAGE) 500 MG tablet, TAKE 1 TABLET EVERY DAY  WITH  BREAKFAST, Disp: 90 tablet, Rfl: 4 .  Multiple Vitamin (MULTIVITAMIN WITH MINERALS) TABS, Take 1 tablet by mouth daily., Disp: , Rfl:  .  simvastatin (ZOCOR) 40 MG tablet, Take 1 tablet (40 mg total) by mouth daily., Disp: 90 tablet, Rfl: 1  Review of Systems:  Constitutional: Denies fever, chills, diaphoresis, appetite change and fatigue.  HEENT: Denies photophobia, eye pain, redness, hearing loss, ear pain, congestion, sore throat, rhinorrhea, sneezing, mouth sores, trouble swallowing, neck pain, neck stiffness and tinnitus.   Respiratory: Denies SOB, DOE, cough, chest tightness,  and wheezing.   Cardiovascular: Denies chest pain, palpitations and leg swelling.  Gastrointestinal: Denies nausea, vomiting, abdominal pain, diarrhea, constipation, blood in stool and abdominal distention.  Genitourinary: Denies dysuria, urgency, frequency, hematuria, flank pain and difficulty urinating.  Endocrine: Denies: hot or cold intolerance, sweats, changes in hair or nails, polyuria, polydipsia. Musculoskeletal: Denies myalgias, joint swelling, arthralgias. Skin: Denies pallor, rash and wound.  Neurological: Denies dizziness, seizures, syncope, weakness, light-headedness, numbness  and headaches.  Hematological: Denies adenopathy. Easy bruising, personal or family bleeding history  Psychiatric/Behavioral: Denies suicidal ideation, mood changes, confusion, nervousness, sleep disturbance and agitation    Physical Exam: Vitals:   09/16/19 1348  BP: 110/80  Pulse: (!) 101  Temp: 97.6 F (36.4 C)  TempSrc: Temporal  SpO2: 94%  Weight: 176 lb (79.8 kg)    Body mass index is 30.21 kg/m.   Constitutional: NAD,  calm, comfortable Eyes: PERRL, lids and conjunctivae normal ENMT: Mucous membranes are moist. Abdomen: no tenderness, no masses palpated. No hepatosplenomegaly. Bowel sounds positive.  Musculoskeletal: no clubbing / cyanosis. No joint deformity upper and lower extremities. Good ROM, no contractures. Normal muscle tone.  Skin: no rashes, lesions, ulcers. No induration Neurologic: CN 2-12 grossly intact. Sensation intact, DTR normal. Strength 5/5 in all 4.  Psychiatric: Normal judgment and insight. Alert and oriented x 3. Normal mood.    Impression and Plan:  Sciatica, left side -Icing, PRN ibuprofen, local massage therapy, back stretches. -She will notify us if no improvement in 7-14 days. -No concern for DVT or serious pathology at present.    Patient Instructions  -Nice seeing you today!!  -Ice for 15 mins twice a day, ibuprofen as needed and back stretches every day.  -Come back to see [place] if no improvement in 14 days.   Sciatica  Sciatica is pain, weakness, tingling, or loss of feeling (numbness) along the sciatic nerve. The sciatic nerve starts in the lower back and goes down the back of each leg. Sciatica usually goes away on its own or with treatment. Sometimes, sciatica may come back (recur). What are the causes? This condition happens when the sciatic nerve is pinched or has pressure put on it. This may be the result of:  A disk in between the bones of the spine bulging out too far (herniated disk).  Changes in the spinal disks that occur with aging.  A condition that affects a muscle in the butt.  Extra bone growth near the sciatic nerve.  A break (fracture) of the area between your hip bones (pelvis).  Pregnancy.  Tumor. This is rare. What increases the risk? You are more likely to develop this condition if you:  Play sports that put pressure or stress on the spine.  Have poor strength and ease of movement (flexibility).  Have had a back injury in the  past.  Have had back surgery.  Sit for long periods of time.  Do activities that involve bending or lifting over and over again.  Are very overweight (obese). What are the signs or symptoms? Symptoms can vary from mild to very bad. They may include:  Any of these problems in the lower back, leg, hip, or butt: ? Mild tingling, loss of feeling, or dull aches. ? Burning sensations. ? Sharp pains.  Loss of feeling in the back of the calf or the sole of the foot.  Leg weakness.  Very bad back pain that makes it hard to move. These symptoms may get worse when you cough, sneeze, or laugh. They may also get worse when you sit or stand for long periods of time. How is this treated? This condition often gets better without any treatment. However, treatment may include:  Changing or cutting back on physical activity when you have pain.  Doing exercises and stretching.  Putting ice or heat on the affected area.  Medicines that help: ? To relieve pain and swelling. ? To relax your muscles.  Shots (  injections) of medicines that help to relieve pain, irritation, and swelling.  Surgery. Follow these instructions at home: Medicines  Take over-the-counter and prescription medicines only as told by your doctor.  Ask your doctor if the medicine prescribed to you: ? Requires you to avoid driving or using heavy machinery. ? Can cause trouble pooping (constipation). You may need to take these steps to prevent or treat trouble pooping:  Drink enough fluids to keep your pee (urine) pale yellow.  Take over-the-counter or prescription medicines.  Eat foods that are high in fiber. These include beans, whole grains, and fresh fruits and vegetables.  Limit foods that are high in fat and sugar. These include fried or sweet foods. Managing pain      If told, put ice on the affected area. ? Put ice in a plastic bag. ? Place a towel between your skin and the bag. ? Leave the ice on for  20 minutes, 2-3 times a day.  If told, put heat on the affected area. Use the heat source that your doctor tells you to use, such as a moist heat pack or a heating pad. ? Place a towel between your skin and the heat source. ? Leave the heat on for 20-30 minutes. ? Remove the heat if your skin turns bright red. This is very important if you are unable to feel pain, heat, or cold. You may have a greater risk of getting burned. Activity   Return to your normal activities as told by your doctor. Ask your doctor what activities are safe for you.  Avoid activities that make your symptoms worse.  Take short rests during the day. ? When you rest for a long time, do some physical activity or stretching between periods of rest. ? Avoid sitting for a long time without moving. Get up and move around at least one time each hour.  Exercise and stretch regularly, as told by your doctor.  Do not lift anything that is heavier than 10 lb (4.5 kg) while you have symptoms of sciatica. ? Avoid lifting heavy things even when you do not have symptoms. ? Avoid lifting heavy things over and over.  When you lift objects, always lift in a way that is safe for your body. To do this, you should: ? Bend your knees. ? Keep the object close to your body. ? Avoid twisting. General instructions  Stay at a healthy weight.  Wear comfortable shoes that support your feet. Avoid wearing high heels.  Avoid sleeping on a mattress that is too soft or too hard. You might have less pain if you sleep on a mattress that is firm enough to support your back.  Keep all follow-up visits as told by your doctor. This is important. Contact a doctor if:  You have pain that: ? Wakes you up when you are sleeping. ? Gets worse when you lie down. ? Is worse than the pain you have had in the past. ? Lasts longer than 4 weeks.  You lose weight without trying. Get help right away if:  You cannot control when you pee (urinate) or  poop (have a bowel movement).  You have weakness in any of these areas and it gets worse: ? Lower back. ? The area between your hip bones. ? Butt. ? Legs.  You have redness or swelling of your back.  You have a burning feeling when you pee. Summary  Sciatica is pain, weakness, tingling, or loss of feeling (numbness) along the  sciatic nerve.  This condition happens when the sciatic nerve is pinched or has pressure put on it.  Sciatica can cause pain, tingling, or loss of feeling (numbness) in the lower back, legs, hips, and butt.  Treatment often includes rest, exercise, medicines, and putting ice or heat on the affected area. This information is not intended to replace advice given to you by your health care provider. Make sure you discuss any questions you have with your health care provider. Document Released: 09/09/2008 Document Revised: 12/20/2018 Document Reviewed: 12/20/2018 Elsevier Patient Education  2020 [place], [place] [facility]

## 2019-09-16 NOTE — Patient Instructions (Signed)
-Nice seeing you today!!  -Ice for 15 mins twice a day, ibuprofen as needed and back stretches every day.  -Come back to see [place] if no improvement in 14 days.   Sciatica  Sciatica is pain, weakness, tingling, or loss of feeling (numbness) along the sciatic nerve. The sciatic nerve starts in the lower back and goes down the back of each leg. Sciatica usually goes away on its own or with treatment. Sometimes, sciatica may come back (recur). What are the causes? This condition happens when the sciatic nerve is pinched or has pressure put on it. This may be the result of:  A disk in between the bones of the spine bulging out too far (herniated disk).  Changes in the spinal disks that occur with aging.  A condition that affects a muscle in the butt.  Extra bone growth near the sciatic nerve.  A break (fracture) of the area between your hip bones (pelvis).  Pregnancy.  Tumor. This is rare. What increases the risk? You are more likely to develop this condition if you:  Play sports that put pressure or stress on the spine.  Have poor strength and ease of movement (flexibility).  Have had a back injury in the past.  Have had back surgery.  Sit for long periods of time.  Do activities that involve bending or lifting over and over again.  Are very overweight (obese). What are the signs or symptoms? Symptoms can vary from mild to very bad. They may include:  Any of these problems in the lower back, leg, hip, or butt: ? Mild tingling, loss of feeling, or dull aches. ? Burning sensations. ? Sharp pains.  Loss of feeling in the back of the calf or the sole of the foot.  Leg weakness.  Very bad back pain that makes it hard to move. These symptoms may get worse when you cough, sneeze, or laugh. They may also get worse when you sit or stand for long periods of time. How is this treated? This condition often gets better without any treatment. However, treatment may include:   Changing or cutting back on physical activity when you have pain.  Doing exercises and stretching.  Putting ice or heat on the affected area.  Medicines that help: ? To relieve pain and swelling. ? To relax your muscles.  Shots (injections) of medicines that help to relieve pain, irritation, and swelling.  Surgery. Follow these instructions at home: Medicines  Take over-the-counter and prescription medicines only as told by your doctor.  Ask your doctor if the medicine prescribed to you: ? Requires you to avoid driving or using heavy machinery. ? Can cause trouble pooping (constipation). You may need to take these steps to prevent or treat trouble pooping:  Drink enough fluids to keep your pee (urine) pale yellow.  Take over-the-counter or prescription medicines.  Eat foods that are high in fiber. These include beans, whole grains, and fresh fruits and vegetables.  Limit foods that are high in fat and sugar. These include fried or sweet foods. Managing pain      If told, put ice on the affected area. ? Put ice in a plastic bag. ? Place a towel between your skin and the bag. ? Leave the ice on for 20 minutes, 2-3 times a day.  If told, put heat on the affected area. Use the heat source that your doctor tells you to use, such as a moist heat pack or a heating pad. ? Place  a towel between your skin and the heat source. ? Leave the heat on for 20-30 minutes. ? Remove the heat if your skin turns bright red. This is very important if you are unable to feel pain, heat, or cold. You may have a greater risk of getting burned. Activity   Return to your normal activities as told by your doctor. Ask your doctor what activities are safe for you.  Avoid activities that make your symptoms worse.  Take short rests during the day. ? When you rest for a long time, do some physical activity or stretching between periods of rest. ? Avoid sitting for a long time without moving. Get up  and move around at least one time each hour.  Exercise and stretch regularly, as told by your doctor.  Do not lift anything that is heavier than 10 lb (4.5 kg) while you have symptoms of sciatica. ? Avoid lifting heavy things even when you do not have symptoms. ? Avoid lifting heavy things over and over.  When you lift objects, always lift in a way that is safe for your body. To do this, you should: ? Bend your knees. ? Keep the object close to your body. ? Avoid twisting. General instructions  Stay at a healthy weight.  Wear comfortable shoes that support your feet. Avoid wearing high heels.  Avoid sleeping on a mattress that is too soft or too hard. You might have less pain if you sleep on a mattress that is firm enough to support your back.  Keep all follow-up visits as told by your doctor. This is important. Contact a doctor if:  You have pain that: ? Wakes you up when you are sleeping. ? Gets worse when you lie down. ? Is worse than the pain you have had in the past. ? Lasts longer than 4 weeks.  You lose weight without trying. Get help right away if:  You cannot control when you pee (urinate) or poop (have a bowel movement).  You have weakness in any of these areas and it gets worse: ? Lower back. ? The area between your hip bones. ? Butt. ? Legs.  You have redness or swelling of your back.  You have a burning feeling when you pee. Summary  Sciatica is pain, weakness, tingling, or loss of feeling (numbness) along the sciatic nerve.  This condition happens when the sciatic nerve is pinched or has pressure put on it.  Sciatica can cause pain, tingling, or loss of feeling (numbness) in the lower back, legs, hips, and butt.  Treatment often includes rest, exercise, medicines, and putting ice or heat on the affected area. This information is not intended to replace advice given to you by your health care provider. Make sure you discuss any questions you have with  your health care provider. Document Released: 09/09/2008 Document Revised: 12/20/2018 Document Reviewed: 12/20/2018 Elsevier Patient Education  [place].

## 2019-09-22 ENCOUNTER — Encounter (HOSPITAL_COMMUNITY): Payer: Self-pay

## 2019-09-22 ENCOUNTER — Other Ambulatory Visit: Payer: Self-pay | Admitting: Internal Medicine

## 2019-09-22 ENCOUNTER — Other Ambulatory Visit: Payer: Self-pay

## 2019-09-22 ENCOUNTER — Ambulatory Visit (HOSPITAL_COMMUNITY)
Admission: EM | Admit: 2019-09-22 | Discharge: 2019-09-22 | Disposition: A | Discharge status: Home / Self Care | Payer: Medicare HMO | Attending: Nurse Practitioner | Admitting: Nurse Practitioner

## 2019-09-22 DIAGNOSIS — M5432 Sciatica, left side: Secondary | ICD-10-CM

## 2019-09-22 DIAGNOSIS — I10 Essential (primary) hypertension: Secondary | ICD-10-CM

## 2019-09-22 MED ORDER — PREDNISONE 20 MG PO TABS: 40.0000 mg | ORAL_TABLET | Freq: Every day | ORAL | 0 refills | Status: AC

## 2019-09-22 MED ORDER — DICLOFENAC SODIUM 50 MG PO TBEC: 50.0000 mg | DELAYED_RELEASE_TABLET | Freq: Three times a day (TID) | ORAL | 0 refills | Status: DC | PRN

## 2019-09-22 MED ORDER — TRAMADOL HCL 50 MG PO TABS: 50.0000 mg | ORAL_TABLET | Freq: Two times a day (BID) | ORAL | 0 refills | Status: AC

## 2019-09-22 NOTE — ED Triage Notes (Signed)
Pt states she saw her PCP told her it was her sciatic nerve in her left leg and hip. This has been going on for a week now.

## 2019-09-22 NOTE — ED Provider Notes (Signed)
[place]    CSN: [id number] Arrival date & time: 09/22/19  1039      History   Chief Complaint Chief Complaint  Patient presents with   Leg Pain    HPI Danielle Atkins is a 84 y.o. female.   Subjective:  Danielle Atkins is a 84 y.o. female who presents for evaluation of low back pain. The patient has had no prior back problems. Symptoms have been present for 1 week and are unchanged.  Onset was related to / precipitated by no known injury. The pain is located in the left gluteal area and radiates down the posterior left thigh and left lower leg. The pain is described as aching and sharp and occurs all day. She rates her pain as a 8 on a scale of 0-10. Symptoms are exacerbated by extension, flexion, sitting, standing and walking. Symptoms are some improved by change in body position and NSAIDs. She was evaluated by her PCP for this about 5 days ago and diagnosis with sciatica. She was told to take ibuprofen twice daily which has provided very little symptom relief. She denies any weakness in the left leg, tingling in the left leg, burning pain in the left leg, urinary incontinence, urinary retention, bowel incontinence or groin/perineal numbness associated with the back pain. The patient has no "red flag" history indicative of complicated back pain.  The following portions of the patient's history were reviewed and updated as appropriate: allergies, current medications, past family history, past medical history, past social history, past surgical history and problem list.       Past Medical History:  Diagnosis Date   ANEMIA-NOS 06/25/2007   GOUT 11/16/2009   PARESTHESIA, HANDS 03/27/2009   PEPTIC ULCER DISEASE 06/25/2007   RENAL INSUFFICIENCY 06/25/2007   TRANSIENT ISCHEMIC ATTACK, HX OF 06/25/2007    Patient Active Problem List   Diagnosis Date Noted   CKD (chronic kidney disease) stage 3, GFR 30-59 ml/min 09/01/2019   Diabetes mellitus with renal  complications ([place]) 123456   GOUT 11/16/2009   PARESTHESIA, HANDS 03/27/2009   Pure hypercholesterolemia 06/25/2007   Microcytic anemia 06/25/2007   Essential hypertension 06/25/2007   PUD (peptic ulcer disease) 06/25/2007   Disorder resulting from impaired renal function 06/25/2007   TRANSIENT ISCHEMIC ATTACK, HX OF 06/25/2007    History reviewed. No pertinent surgical history.  OB History   No obstetric history on file.      Home Medications    Prior to Admission medications   Medication Sig Start Date End Date Taking? Authorizing Provider  albuterol (PROAIR HFA) 108 (90 Base) MCG/ACT inhaler Inhale 2 puffs into the lungs every 6 (six) hours as needed for wheezing. 12/28/18   [name], MD  allopurinol (ZYLOPRIM) 300 MG tablet TAKE 1 TABLET EVERY DAY 07/26/19   [name], [name], MD  amLODipine (NORVASC) 5 MG tablet Take 1 tablet (5 mg total) by mouth daily. 04/01/19   [name], [name], MD  aspirin 81 MG tablet Take 1 tablet (81 mg total) by mouth daily. 06/29/14   [name], MD  diclofenac (VOLTAREN) 50 MG EC tablet Take 1 tablet (50 mg total) by mouth 3 (three) times daily as needed. Take 1 tablet TID x 5 days then PRN for pain 09/22/19   Enrique Sack, FNP  famotidine (PEPCID) 40 MG tablet Take 1 tablet (40 mg total) by mouth daily. 03/01/19   [name], [name], MD  glucose blood test strip 1 each  by Other route daily as needed for other. 04/12/13   [name], MD  levothyroxine (SYNTHROID) 50 MCG tablet Take 1 tablet (50 mcg total) by mouth daily before breakfast. 09/01/19   [name], [name], MD  meclizine (ANTIVERT) 12.5 MG tablet Take 1 tablet (12.5 mg total) by mouth 3 (three) times daily as needed for dizziness. 06/20/19   Billie Ruddy, MD  metFORMIN (GLUCOPHAGE) 500 MG tablet TAKE 1 TABLET EVERY DAY  WITH  BREAKFAST 03/22/19   [name], [name], MD  Multiple Vitamin (MULTIVITAMIN WITH  MINERALS) TABS Take 1 tablet by mouth daily.    [provider]  predniSONE (DELTASONE) 20 MG tablet Take 2 tablets (40 mg total) by mouth daily for 5 days. 09/22/19 09/27/19  Enrique Sack, FNP  simvastatin (ZOCOR) 40 MG tablet Take 1 tablet (40 mg total) by mouth daily. 03/01/19   [name], [name], MD  traMADol (ULTRAM) 50 MG tablet Take 1 tablet (50 mg total) by mouth every 12 (twelve) hours for 3 days. 09/22/19 09/25/19  Enrique Sack, FNP    Family History History reviewed. No pertinent family history.  Social History Social History   Tobacco Use   Smoking status: Former Smoker    Quit date: 12/16/1999    Years since quitting: 19.7   Smokeless tobacco: Never Used  Substance Use Topics   Alcohol use: No   Drug use: No     Allergies   Patient has no known allergies.   Review of Systems Review of Systems  Constitutional: Negative.   Genitourinary: Negative.   Musculoskeletal: Positive for back pain.  Neurological: Negative.   All other systems reviewed and are negative.    Physical Exam Triage Vital Signs ED Triage Vitals  Enc Vitals Group     BP 09/22/19 1116 136/80     Pulse Rate 09/22/19 1116 84     Resp 09/22/19 1116 18     Temp 09/22/19 1116 97.9 F (36.6 C)     Temp Source 09/22/19 1116 Oral     SpO2 09/22/19 1116 97 %     Weight 09/22/19 1114 169 lb (76.7 kg)     Height --      Head Circumference --      Peak Flow --      Pain Score 09/22/19 1114 10     Pain Loc --      Pain Edu? --      Excl. in [place]? --    No data found.  Updated Vital Signs BP 136/80 (BP Location: Right Arm)    Pulse 84    Temp 97.9 F (36.6 C) (Oral)    Resp 18    Wt 169 lb (76.7 kg)    SpO2 97%    BMI 29.01 kg/m   Visual Acuity Right Eye Distance:   Left Eye Distance:   Bilateral Distance:    Right Eye Near:   Left Eye Near:    Bilateral Near:     Physical Exam Vitals signs reviewed.  Constitutional:      General: She is not in acute  distress.    Appearance: Normal appearance. She is not ill-appearing, toxic-appearing or diaphoretic.  Neck:     Musculoskeletal: Normal range of motion.  Cardiovascular:     Rate and Rhythm: Normal rate and regular rhythm.  Pulmonary:     Effort: Pulmonary effort is normal.     Breath sounds: Normal breath sounds.  Musculoskeletal: Normal range of  motion.     Lumbar back: She exhibits pain and spasm.  Skin:    General: Skin is warm and dry.  Neurological:     General: No focal deficit present.     Mental Status: She is alert and oriented to person, place, and time.  Psychiatric:        Mood and Affect: Mood normal.      UC Treatments / Results  Labs (all labs ordered are listed, but only abnormal results are displayed) Labs Reviewed - No data to display  EKG   Radiology No results found.  Procedures Procedures (including critical care time)  Medications Ordered in UC Medications - No data to display  Initial Impression / Assessment and Plan / UC Course  I have reviewed the triage vital signs and the nursing notes.  Pertinent labs & imaging results that were available during my care of the patient were reviewed by me and considered in my medical decision making (see chart for details).    84 yo female presenting with left gluteal pain with radiation down the posterior left thigh and left lower leg x 1 week. No weakness in the left leg, tingling in the left leg, burning pain in the left leg, urinary incontinence, urinary retention, bowel incontinence or groin/perineal numbness associated with the pain. Symptoms unrelieved with ibuprofen and other supportive measures. Natural history and expected course discussed. Questions answered. Neurosurgeon distributed. Proper lifting, bending technique discussed. Stretching exercises discussed. Regular aerobic and trunk strengthening exercises discussed. Short (2-4 day) period of relative rest recommended until acute symptoms  improve. Ice to affected area as needed for local pain relief. Heat to affected area as needed for local pain relief. NSAIDs and steroids per medication orders. Follow-up with PCP in 5 days.  Today's evaluation has revealed no signs of a dangerous process. Discussed diagnosis with patient and/or guardian. Patient and/or guardian aware of their diagnosis, possible red flag symptoms to watch out for and need for close follow up. Patient and/or guardian understands verbal and written discharge instructions. Patient and/or guardian comfortable with plan and disposition.  Patient and/or guardian has a clear mental status at this time, good insight into illness (after discussion and teaching) and has clear judgment to make decisions regarding their care  This care was provided during an unprecedented National Emergency due to the Novel Coronavirus (COVID-19) pandemic. COVID-19 infections and transmission risks place heavy strains on healthcare resources.  As this pandemic evolves, our facility, providers, and staff strive to respond fluidly, to remain operational, and to provide care relative to available resources and information. Outcomes are unpredictable and treatments are without well-defined guidelines. Further, the impact of COVID-19 on all aspects of urgent care, including the impact to patients seeking care for reasons other than COVID-19, is unavoidable during this national emergency. At this time of the global pandemic, management of patients has significantly changed, even for non-COVID positive patients given high local and regional COVID volumes at this time requiring high healthcare system and resource utilization. The standard of care for management of both COVID suspected and non-COVID suspected patients continues to change rapidly at the local, regional, national, and global levels. This patient was worked up and treated to the best available but ever changing evidence and resources available at this  current time.   Documentation was completed with the aid of voice recognition software. Transcription may contain typographical errors.  Final Clinical Impressions(s) / UC Diagnoses   Final diagnoses:  Sciatica of left side  Discharge Instructions     Stop ibuprofen. Take medications that have been prescribed today as ordered. Continue alternating between ice and heat to affected areas at least three times a day.   Take care & stay safe,  [facility]     ED Prescriptions    Medication Sig Dispense Auth. Provider   diclofenac (VOLTAREN) 50 MG EC tablet Take 1 tablet (50 mg total) by mouth 3 (three) times daily as needed. Take 1 tablet TID x 5 days then PRN for pain 21 tablet Enrique Sack, FNP   predniSONE (DELTASONE) 20 MG tablet Take 2 tablets (40 mg total) by mouth daily for 5 days. 10 tablet Enrique Sack, FNP   traMADol (ULTRAM) 50 MG tablet Take 1 tablet (50 mg total) by mouth every 12 (twelve) hours for 3 days. 6 tablet Enrique Sack, FNP     I have reviewed the PDMP during this encounter.   Enrique Sack, [place] 09/22/19 1218

## 2019-09-22 NOTE — Discharge Instructions (Addendum)
Stop ibuprofen. Take medications that have been prescribed today as ordered. Continue alternating between ice and heat to affected areas at least three times a day.   Take care & stay safe,  Danielle Atkins

## 2019-09-28 ENCOUNTER — Other Ambulatory Visit: Payer: Self-pay

## 2019-09-28 ENCOUNTER — Ambulatory Visit (HOSPITAL_COMMUNITY)
Admission: EM | Admit: 2019-09-28 | Discharge: 2019-09-28 | Disposition: A | Discharge status: Home / Self Care | Payer: Medicare HMO | Attending: Family Medicine | Admitting: Family Medicine

## 2019-09-28 ENCOUNTER — Encounter (HOSPITAL_COMMUNITY): Payer: Self-pay

## 2019-09-28 DIAGNOSIS — M7918 Myalgia, other site: Secondary | ICD-10-CM

## 2019-09-28 DIAGNOSIS — M79605 Pain in left leg: Secondary | ICD-10-CM | POA: Diagnosis not present

## 2019-09-28 MED ORDER — DEXAMETHASONE SODIUM PHOSPHATE 10 MG/ML IJ SOLN: 10.0000 mg | Freq: Once | INTRAMUSCULAR | Status: DC

## 2019-09-28 MED ORDER — TRAMADOL HCL 50 MG PO TABS: 50.0000 mg | ORAL_TABLET | Freq: Four times a day (QID) | ORAL | 0 refills | Status: DC | PRN

## 2019-09-28 NOTE — ED Provider Notes (Signed)
[name]   [id number] 09/28/19 Arrival Time: [id number]  ASSESSMENT & PLAN:  1. Left leg pain   2. Left buttock pain     Able to ambulate here and hemodynamically stable. No indication for imaging of back at this time given no trauma and normal neurological exam. Discussed.  (Patient left before receiving IM Decadron.) Meds ordered this encounter  Medications  . dexamethasone (DECADRON) injection 10 mg  . traMADol (ULTRAM) 50 MG tablet    Sig: Take 1 tablet (50 mg total) by mouth every 6 (six) hours as needed.    Dispense:  12 tablet    Refill:  0   Encourage ROM/movement as tolerated.  Recommend: Follow-up Information    Schedule an appointment as soon as possible for a visit  with [place].   Contact information: [place] [phone]         [place] Controlled Substances Registry consulted for this patient. I feel the risk/benefit ratio today is favorable for proceeding with this prescription for a controlled substance. Medication sedation precautions given.  Reviewed expectations re: course of current medical issues. Questions answered. Outlined signs and symptoms indicating need for more acute intervention. Patient verbalized understanding. After Visit Summary given.   SUBJECTIVE: History from: patient. Seen here on 10/80/2020. Has finished prescribed medications. "Helped a little while I was taking them".  Danielle Atkins is a 84 y.o. female who presents with complaint of continuing and fairly persistent left sided buttock and leg pain. No specific back pain. Onset 2-3 weeks ago. Gradual. Injury/trama: none reported. History of back problems requiring medical care: no. Pain described as aching, throbbing and with occasional sharp buttock pain. Does reports occasional radiation of buttock pain to her posterior thigh. Pain is worse with prolonged walking/standing, unchanged with  movements involving back, and sometimes improved with rest. Progressive LE weakness or saddle anesthesia: none. Extremity sensation changes or weakness: none. Ambulatory without difficulty. Performing normal daily activities without difficulty. Normal bowel/bladder habits: yes; without urinary retention. Normal PO intake without n/v. No associated abdominal pain/n/v. Self treatment: has tried Tylenol without much relief.  Reports no chronic steroid use, fevers, IV drug use, or recent back surgeries or procedures.  ROS: As per HPI. All other systems negative.   OBJECTIVE:  Vitals:   09/28/19 1026 09/28/19 1028  BP:  139/78  Pulse:  90  Resp:  18  Temp:  97.8 F (36.6 C)  SpO2:  98%  Weight: 75.8 kg     General appearance: alert; no distress HEENT: [place]; AT Neck: supple with FROM; without midline tenderness CV: RRR Lungs: unlabored respirations; symmetrical air entry Abdomen: soft, non-tender; non-distended Back: no specific tenderness to palpation; FROM at waist; bruising: none; without midline tenderness Buttock: does report mild and poorly localized TTP over L buttock Extremities: without edema; symmetrical without gross deformities; normal ROM of bilateral LE; does reports "soreness" of L calf area without swelling or overlying erythema Skin: warm and dry Neurologic: normal gait; normal reflexes of bilateral LE; normal sensation of bilateral LE; normal strength of bilateral LE Psychological: alert and cooperative; normal mood and affect   No Known Allergies  Past Medical History:  Diagnosis Date  . ANEMIA-NOS 06/25/2007  . GOUT 11/16/2009  . PARESTHESIA, HANDS 03/27/2009  . PEPTIC ULCER DISEASE 06/25/2007  . RENAL INSUFFICIENCY 06/25/2007  . TRANSIENT ISCHEMIC ATTACK, HX OF 06/25/2007   Social History   Socioeconomic History  . Marital  status: Widowed    Spouse name: Not on file  . Number of children: Not on file  . Years of education: Not on file  . Highest education  level: Not on file  Occupational History  . Not on file  Social Needs  . Financial resource strain: Not on file  . Food insecurity    Worry: Not on file    Inability: Not on file  . Transportation needs    Medical: Not on file    Non-medical: Not on file  Tobacco Use  . Smoking status: Former Smoker    Quit date: 12/16/1999    Years since quitting: 19.7  . Smokeless tobacco: Never Used  Substance and Sexual Activity  . Alcohol use: No  . Drug use: No  . Sexual activity: Not on file  Lifestyle  . Physical activity    Days per week: Not on file    Minutes per session: Not on file  . Stress: Not on file  Relationships  . Social Herbalist on phone: Not on file    Gets together: Not on file    Attends religious service: Not on file    Active member of club or organization: Not on file    Attends meetings of clubs or organizations: Not on file    Relationship status: Not on file  . Intimate partner violence    Fear of current or ex partner: Not on file    Emotionally abused: Not on file    Physically abused: Not on file    Forced sexual activity: Not on file  Other Topics Concern  . Not on file  Social History Narrative  . Not on file    History reviewed. No pertinent surgical history.   Vanessa Kick, MD 09/28/19 1122

## 2019-09-28 NOTE — ED Triage Notes (Signed)
Pt states her sciatic nerve is bothering her. X 5 days. ( left leg pain )

## 2019-09-28 NOTE — ED Notes (Signed)
Patient discharged by other prior to receiving medication ordered.  Dr hagler notified.

## 2019-09-28 NOTE — Discharge Instructions (Addendum)
Be aware, pain medications may cause drowsiness. Please do not drive, operate heavy machinery or make important decisions while on this medication, it can cloud your judgement.  Also, blood sugars will increase while you are on steroids (the shot given today).

## 2019-09-29 ENCOUNTER — Ambulatory Visit: Payer: Medicare HMO | Admitting: Family Medicine

## 2019-09-29 ENCOUNTER — Other Ambulatory Visit: Payer: Self-pay

## 2019-09-29 VITALS — BP 129/80 | Ht 64.5 in | Wt 167.0 lb

## 2019-09-29 DIAGNOSIS — G5702 Lesion of sciatic nerve, left lower limb: Secondary | ICD-10-CM

## 2019-09-29 HISTORY — DX: Lesion of sciatic nerve, left lower limb: G57.02

## 2019-09-29 NOTE — Assessment & Plan Note (Signed)
Exam and history consistent with piriformis syndrome.  No evidence of sciatica on exam, although could have transient symptoms at times, but would expect this to be at the posterior leg.  No pathology noted in hip joint and no evidence of greater trochanteric pain syndrome.  Will proceed with PT and use tylenol prn for pain, given CKD III and recent NSAID and steroid use.  Given piriformis stretching information.  Also advised use of OTC voltaren gel or capsaicin cream for pain.  F/U 6-8 weeks if no improvement.

## 2019-09-29 NOTE — Progress Notes (Signed)
Danielle Atkins is a 84 y.o. female who presents to [facility] today for the following:  Left Leg Pain Pain started in her lateral left ankle and has extended up her leg, now her pain is in her left buttocks Started 3-4 wks ago Has been seen at Urgent Care 10/8 and 10/14, also seen by PCP for this on 10/2 Told by PCP to take ibuprofen, didn't help A first urgent care visit prescribed voltaren 50mg  TID PRN, prednisone 40mg  x 5 days, and tramadol 50mg  BID x 3 days.  States that she thinks that medications helped her, but when she finished, the pain came back. Second urgent care visit was given tramadol 50mg  QID PRN.  In chart notes that she received a decadron injection, but she states that she did not receive this. Has never had this before. Pain wakes her during the night Has tried warm water and ice and has helped Was walking 3 times a week, but has stopped due to pain Has been walking for years at the [facility] for at least three times a week, and then [employer] No swelling, redness, bruising. No injury or trauma. Started all of a sudden, but gradually worsened. No numbness or tingling down the leg, no burning sensation. No back pain.  No change in bowel or bladder habits.  No saddle anesthesia. No weakness in LLE. Not associated with a particular movement Sometimes pain stays in buttocks, sometimes it extends down the lateral leg to the foot.  No pain in posterior thigh. Rates pain >10 at worst Describes pain as an ache  PMH reviewed. Pre-diabetes , CKD 3, Gout, HTN ROS as above. Medications reviewed.  Exam:  BP 129/80   Ht 5' 4.5" (1.638 m)   Wt 167 lb (75.8 kg)   BMI 28.22 kg/m  Gen: Well NAD  Left Hip:  - Inspection: No gross deformity, no swelling, erythema, or ecchymosis bilaterally.  Pes planus L>R.   - Palpation: TTP piriformis region on left, No TTP over greater trochanter or ischial tuberosity specifically, mild TTP IT band.  No TTP right piriformis. - ROM: Normal  range of motion in extension, abduction, internal and external rotation of bilateral hips.  Left hip with slightly decreased flexion to 70 degrees. - Strength: Normal strength in hip flexion, extension, abduction, knee flexion, knee extension bilaterally. - Neuro/vasc: NV intact distally, 2+ dorsalis pedis pulses bilaterally - Special Tests: Negative FABER and FADIR.  Negative Trendelenberg.  Negative Ober's.  Pain in piriformis region with log roll. Pain with piriformis stretch  Back: No TTP of throacic or lumbar spinous processes, no TTP paraspinal musculature bilaterally, full ROM in flexion and extension without pain, negative straight leg raise  Right hip: No deformity. FROM with 5/5 strength. No tenderness to palpation. NVI distally. Negative logroll.  Dg Bone Density (dxa)  Result Date: 09/01/2019 Date of study: 09/01/19 Exam: DUAL X-RAY ABSORPTIOMETRY (DXA) FOR BONE MINERAL DENSITY (BMD) Instrument: [employer] Requesting Provider: PCP Indication: follow up for low BMD Comparison: none (please note that it is not possible to compare data from different instruments) Clinical data: Pt is a 84 y.o. female without previous history of fracture. Results:  Lumbar spine L1-L4 Femoral neck (FN) T-score -1.8 RFN: -2.2 LFN:  -2.0 Assessment: By the [facility] Criteria for diagnosis based on bone density, this patient has Low Bone Density Z Score compares the patients bone density to age, sex, and race matched controls.  Compared to age, sex, and race matched controls, this patient's  bone density is average FRAX 10-year fracture risk calculator: 7.6 % for any major fracture and 2.3 % for hip fracture.  Pharmacologic therapy is recommended if 10 year fracture risk is >20% for any major osteoporotic fracture or >3% for hip fracture.  Comments: the technical quality of the study is good. WHO criteria for diagnosis of osteoporosis in postmenopausal women and in men 66 y/o or older: - normal: T-score -1.0 to  + 1.0 - osteopenia/low bone density: T-score between -2.5 and -1.0 - osteoporosis: T-score below -2.5 - severe osteoporosis: T-score below -2.5 with history of fragility fracture Note: although not part of the WHO classification, the presence of a fragility fracture, regardless of the T-score, should be considered diagnostic of osteoporosis, provided other causes for the fracture have been excluded. RECOMMENDATIONS:  Recommend optimizing calcium (1200 mg/day) and vitamin D (800 IU/day) intake  Follow up BMD is recommended: 2 years. Interpreted by : Mack Guise, MD [facility]     Assessment and Plan: 1) Piriformis syndrome, left Exam and history consistent with piriformis syndrome.  No evidence of sciatica on exam, although could have transient symptoms at times, but would expect this to be at the posterior leg.  No pathology noted in hip joint and no evidence of greater trochanteric pain syndrome.  Will proceed with PT and use tylenol prn for pain, given CKD III and recent NSAID and steroid use.  Given piriformis stretching information.  Also advised use of OTC voltaren gel or capsaicin cream for pain.  F/U 6-8 weeks if no improvement.   [place] Constable, D.O.  PGY-2 Family Medicine  09/29/2019 11:23 AM

## 2019-09-29 NOTE — Patient Instructions (Signed)
You have piriformis syndrome. Try to avoid painful activities when possible. Pick 2-3 stretches where you feel the pull in the area of pain - do 3 of these and hold for 20-30 seconds twice a day. Use Tylenol 2 tabs every 8 hours as needed for pain. You can try topical creams as well for pain that include Voltaren Gel or Capsaicin cream.  You can find these at the pharmacy. Tennis ball to massage area when sitting. We will do physical therapy. Follow up with me in 6-8 weeks or sooner if worsening of pain.

## 2019-09-30 ENCOUNTER — Encounter: Payer: Self-pay | Admitting: Family Medicine

## 2019-10-05 ENCOUNTER — Other Ambulatory Visit: Payer: Self-pay | Admitting: Internal Medicine

## 2019-10-05 DIAGNOSIS — E7800 Pure hypercholesterolemia, unspecified: Secondary | ICD-10-CM

## 2019-10-05 DIAGNOSIS — K279 Peptic ulcer, site unspecified, unspecified as acute or chronic, without hemorrhage or perforation: Secondary | ICD-10-CM

## 2019-10-07 DIAGNOSIS — M25552 Pain in left hip: Secondary | ICD-10-CM | POA: Diagnosis not present

## 2019-10-07 DIAGNOSIS — M6281 Muscle weakness (generalized): Secondary | ICD-10-CM | POA: Diagnosis not present

## 2019-10-17 DIAGNOSIS — M545 Low back pain: Secondary | ICD-10-CM | POA: Diagnosis not present

## 2019-10-17 DIAGNOSIS — M2560 Stiffness of unspecified joint, not elsewhere classified: Secondary | ICD-10-CM | POA: Diagnosis not present

## 2019-10-17 DIAGNOSIS — M5417 Radiculopathy, lumbosacral region: Secondary | ICD-10-CM | POA: Diagnosis not present

## 2019-10-21 DIAGNOSIS — M2560 Stiffness of unspecified joint, not elsewhere classified: Secondary | ICD-10-CM | POA: Diagnosis not present

## 2019-10-21 DIAGNOSIS — M5417 Radiculopathy, lumbosacral region: Secondary | ICD-10-CM | POA: Diagnosis not present

## 2019-10-21 DIAGNOSIS — M545 Low back pain: Secondary | ICD-10-CM | POA: Diagnosis not present

## 2019-10-26 DIAGNOSIS — M545 Low back pain: Secondary | ICD-10-CM | POA: Diagnosis not present

## 2019-10-26 DIAGNOSIS — M5417 Radiculopathy, lumbosacral region: Secondary | ICD-10-CM | POA: Diagnosis not present

## 2019-10-26 DIAGNOSIS — M2560 Stiffness of unspecified joint, not elsewhere classified: Secondary | ICD-10-CM | POA: Diagnosis not present

## 2019-10-27 ENCOUNTER — Ambulatory Visit (HOSPITAL_COMMUNITY)
Admission: EM | Admit: 2019-10-27 | Discharge: 2019-10-27 | Disposition: A | Discharge status: Home / Self Care | Payer: Medicare HMO | Attending: Internal Medicine | Admitting: Internal Medicine

## 2019-10-27 ENCOUNTER — Other Ambulatory Visit: Payer: Self-pay

## 2019-10-27 ENCOUNTER — Ambulatory Visit: Payer: Self-pay | Admitting: *Deleted

## 2019-10-27 ENCOUNTER — Encounter (HOSPITAL_COMMUNITY): Payer: Self-pay

## 2019-10-27 DIAGNOSIS — A084 Viral intestinal infection, unspecified: Secondary | ICD-10-CM | POA: Diagnosis not present

## 2019-10-27 LAB — BASIC METABOLIC PANEL
Anion gap: 11 (ref 5–15)
BUN: 14 mg/dL (ref 8–23)
CO2: 24 mmol/L (ref 22–32)
Calcium: 9.5 mg/dL (ref 8.9–10.3)
Chloride: 103 mmol/L (ref 98–111)
Creatinine, Ser: 1.12 mg/dL — ABNORMAL HIGH (ref 0.44–1.00)
GFR calc Af Amer: 53 mL/min — ABNORMAL LOW (ref >60)
GFR calc non Af Amer: 46 mL/min — ABNORMAL LOW (ref >60)
Glucose, Bld: 125 mg/dL — ABNORMAL HIGH (ref 70–99)
Potassium: 4.3 mmol/L (ref 3.5–5.1)
Sodium: 138 mmol/L (ref 135–145)

## 2019-10-27 LAB — CBC
HCT: 36.5 % (ref 36.0–46.0)
Hemoglobin: 11.1 g/dL — ABNORMAL LOW (ref 12.0–15.0)
MCH: 21.3 pg — ABNORMAL LOW (ref 26.0–34.0)
MCHC: 30.4 g/dL (ref 30.0–36.0)
MCV: 70.2 fL — ABNORMAL LOW (ref 80.0–100.0)
Platelets: 227 10*3/uL (ref 150–400)
RBC: 5.2 MIL/uL — ABNORMAL HIGH (ref 3.87–5.11)
RDW: 19.8 % — ABNORMAL HIGH (ref 11.5–15.5)
WBC: 5.8 10*3/uL (ref 4.0–10.5)
nRBC: 0 % (ref 0.0–0.2)

## 2019-10-27 MED ORDER — ONDANSETRON 4 MG PO TBDP: 4.0000 mg | ORAL_TABLET | Freq: Three times a day (TID) | ORAL | 0 refills | Status: DC | PRN

## 2019-10-27 MED ORDER — SODIUM CHLORIDE 0.9 % IV BOLUS
1000.0000 mL | Freq: Once | INTRAVENOUS | Status: AC
  Administered 2019-10-27: 1000 mL via INTRAVENOUS

## 2019-10-27 NOTE — Telephone Encounter (Signed)
Pt called with waking up to the room spinning this morning. She stated that she started vomiting and her son assisted her to the bathroom. But now she is abe to go to the bathroom by herself. She took a meclizine this morning.  She just took another one when she ate oatmeal.  She denies blurred vision, earache, headache,  weakness or numbness. Advised that she should check her b/p also to make sure that it is not elevated.  Notified [facility] at [facility] for an appointment. No available office visit today Advised to go to an urgent care to be assessed  She voiced understanding and her son will take her. Reason for Disposition . Vomiting occurs with dizziness  Answer Assessment - Initial Assessment Questions 1. DESCRIPTION: "Describe your dizziness."     Room spinning 2. VERTIGO: "Do you feel like either you or the room is spinning or tilting?"      yes 3. LIGHTHEADED: "Do you feel lightheaded?" (e.g., somewhat faint, woozy, weak upon standing)     Weak and woozy when standing 4. SEVERITY: "How bad is it?"  "Can you walk?"   - MILD - Feels unsteady but walking normally.   - MODERATE - Feels very unsteady when walking, but not falling; interferes with normal activities (e.g., school, work) .   - SEVERE - Unable to walk without falling (requires assistance).     Moderate to severe 5. ONSET:  "When did the dizziness begin?"     This morning 6. AGGRAVATING FACTORS: "Does anything make it worse?" (e.g., standing, change in head position)     Movement head and standing, 7. CAUSE: "What do you think is causing the dizziness?"     vertigo 8. RECURRENT SYMPTOM: "Have you had dizziness before?" If so, ask: "When was the last time?" "What happened that time?"     Yes was put on medication for it.  9. OTHER SYMPTOMS: "Do you have any other symptoms?" (e.g., headache, weakness, numbness, vomiting, earache)     Vomiting about 3 times this morning 10. PREGNANCY: "Is there any chance you are pregnant?"  "When was your last menstrual period?"       n/a  Protocols used: DIZZINESS - VERTIGO-A-AH

## 2019-10-27 NOTE — ED Notes (Signed)
No IV pump available to run fluids.  Dr. Lanny Cramp aware and ok with running fluids without the pump.  Pt is sitting in her wheelchair and is comfortable at this time.  She tolerated IV insertion well.  Pt is able to get up and move around if needed and has been told we will be at the RN station if she needs anything.

## 2019-10-27 NOTE — ED Provider Notes (Addendum)
[place]    CSN: [id number] Arrival date & time: 10/27/19  1350      History   Chief Complaint Chief Complaint  Patient presents with  . Dizziness    HPI Danielle Atkins is a 84 y.o. female with a history of hypertension-controlled, diet controlled comes to urgent care with complaint of severe dizziness which started this morning.  Patient was in her usual state of health last night.  This morning when she woke up she started having some vertigo followed by 2 episodes of nonbloody nonbilious vomiting, nausea and 2 bouts of diarrhea.  Patient denies any sick contact.  She denies any abdominal pain, shortness of breath, cough or sputum production.  No fever or chills.  No body aches.  Patient ate some potato salad last night.  No other family member has similar symptoms.  Her appetite as well as her oral fluid intake is diminished. HPI  Past Medical History:  Diagnosis Date  . ANEMIA-NOS 06/25/2007  . GOUT 11/16/2009  . PARESTHESIA, HANDS 03/27/2009  . PEPTIC ULCER DISEASE 06/25/2007  . RENAL INSUFFICIENCY 06/25/2007  . TRANSIENT ISCHEMIC ATTACK, HX OF 06/25/2007    Patient Active Problem List   Diagnosis Date Noted  . Piriformis syndrome, left 09/29/2019  . CKD (chronic kidney disease) stage 3, GFR 30-59 ml/min 09/01/2019  . Diabetes mellitus with renal complications ([place]) 123456  . GOUT 11/16/2009  . PARESTHESIA, HANDS 03/27/2009  . Pure hypercholesterolemia 06/25/2007  . Microcytic anemia 06/25/2007  . Essential hypertension 06/25/2007  . PUD (peptic ulcer disease) 06/25/2007  . Disorder resulting from impaired renal function 06/25/2007  . TRANSIENT ISCHEMIC ATTACK, HX OF 06/25/2007    History reviewed. No pertinent surgical history.  OB History   No obstetric history on file.      Home Medications    Prior to Admission medications   Medication Sig Start Date End Date Taking? Authorizing Provider  albuterol (PROAIR HFA) 108 (90 Base) MCG/ACT  inhaler Inhale 2 puffs into the lungs every 6 (six) hours as needed for wheezing. 12/28/18   [name], MD  allopurinol (ZYLOPRIM) 300 MG tablet TAKE 1 TABLET EVERY DAY 07/26/19   [name], [name], MD  amLODipine (NORVASC) 5 MG tablet TAKE 1 TABLET BY MOUTH EVERY DAY 09/23/19   [name], [name], MD  aspirin 81 MG tablet Take 1 tablet (81 mg total) by mouth daily. 06/29/14   [name], MD  diclofenac (VOLTAREN) 50 MG EC tablet Take 1 tablet (50 mg total) by mouth 3 (three) times daily as needed. Take 1 tablet TID x 5 days then PRN for pain 09/22/19   Enrique Sack, FNP  famotidine (PEPCID) 40 MG tablet TAKE 1 TABLET EVERY DAY 10/06/19   [name], [name], MD  glucose blood test strip 1 each by Other route daily as needed for other. 04/12/13   [name], MD  levothyroxine (SYNTHROID) 50 MCG tablet Take 1 tablet (50 mcg total) by mouth daily before breakfast. 09/01/19   [name], [name], MD  meclizine (ANTIVERT) 12.5 MG tablet Take 1 tablet (12.5 mg total) by mouth 3 (three) times daily as needed for dizziness. Patient not taking: Reported on 09/29/2019 06/20/19   Billie Ruddy, MD  metFORMIN (GLUCOPHAGE) 500 MG tablet TAKE 1 TABLET EVERY DAY  WITH  BREAKFAST 03/22/19   [name], [name], MD  Multiple Vitamin (MULTIVITAMIN WITH MINERALS) TABS Take 1 tablet by mouth daily.  [provider]  simvastatin (ZOCOR) 40 MG tablet TAKE 1 TABLET EVERY DAY 10/06/19   [name], [name], MD  traMADol (ULTRAM) 50 MG tablet Take 1 tablet (50 mg total) by mouth every 6 (six) hours as needed. 09/28/19   Vanessa Kick, MD    Family History History reviewed. No pertinent family history.  Social History Social History   Tobacco Use  . Smoking status: Former Smoker    Quit date: 12/16/1999    Years since quitting: 19.8  . Smokeless tobacco: Never Used  Substance Use Topics  . Alcohol use: No  . Drug use: No      Allergies   Patient has no known allergies.   Review of Systems Review of Systems  Constitutional: Positive for activity change and fatigue. Negative for chills and fever.  HENT: Negative.   Respiratory: Negative.   Cardiovascular: Negative for chest pain, palpitations and leg swelling.  Gastrointestinal: Positive for diarrhea, nausea and vomiting. Negative for abdominal distention.  Genitourinary: Negative for dysuria, frequency and urgency.  Musculoskeletal: Negative.   Skin: Negative.   Neurological: Positive for dizziness and light-headedness. Negative for weakness and headaches.  Psychiatric/Behavioral: Negative.      Physical Exam Triage Vital Signs ED Triage Vitals  Enc Vitals Group     BP 10/27/19 1422 123/77     Pulse Rate 10/27/19 1422 (!) 101     Resp 10/27/19 1422 16     Temp 10/27/19 1422 98.4 F (36.9 C)     Temp Source 10/27/19 1422 Oral     SpO2 10/27/19 1422 98 %     Weight --      Height --      Head Circumference --      Peak Flow --      Pain Score 10/27/19 1420 0     Pain Loc --      Pain Edu? --      Excl. in [place]? --    No data found.  Updated Vital Signs BP 123/77 (BP Location: Right Arm)   Pulse (!) 101   Temp 98.4 F (36.9 C) (Oral)   Resp 16   SpO2 98%   Visual Acuity Right Eye Distance:   Left Eye Distance:   Bilateral Distance:    Right Eye Near:   Left Eye Near:    Bilateral Near:     Physical Exam Vitals signs and nursing note reviewed.  Constitutional:      General: She is not in acute distress.    Appearance: She is ill-appearing.  HENT:     Right Ear: Tympanic membrane normal.     Left Ear: Tympanic membrane normal.  Eyes:     Extraocular Movements: Extraocular movements intact.     Conjunctiva/sclera: Conjunctivae normal.  Neck:     Musculoskeletal: No neck rigidity or muscular tenderness.  Cardiovascular:     Rate and Rhythm: Regular rhythm. Tachycardia present.     Pulses: Normal pulses.     Heart sounds: No  murmur. No friction rub.  Pulmonary:     Effort: Pulmonary effort is normal.     Breath sounds: No wheezing, rhonchi or rales.  Abdominal:     General: Bowel sounds are normal. There is no distension.     Palpations: Abdomen is soft.     Tenderness: There is no abdominal tenderness. There is no rebound.  Musculoskeletal: Normal range of motion.        General: No swelling, tenderness or signs  of injury.  Skin:    General: Skin is warm.     Capillary Refill: Capillary refill takes less than 2 seconds.     Findings: No bruising or erythema.  Neurological:     General: No focal deficit present.     Mental Status: She is alert and oriented to person, place, and time.  Psychiatric:        Mood and Affect: Mood normal.      UC Treatments / Results  Labs (all labs ordered are listed, but only abnormal results are displayed) Labs Reviewed  CBC - Abnormal; Notable for the following components:      Result Value   RBC 5.20 (*)    Hemoglobin 11.1 (*)    MCV 70.2 (*)    MCH 21.3 (*)    RDW 19.8 (*)    All other components within normal limits  BASIC METABOLIC PANEL - Abnormal; Notable for the following components:   Glucose, Bld 125 (*)    Creatinine, Ser 1.12 (*)    GFR calc non Af Amer 46 (*)    GFR calc Af Amer 53 (*)    All other components within normal limits    EKG   Radiology No results found.  Procedures Procedures (including critical care time)  Medications Ordered in UC Medications  sodium chloride 0.9 % bolus 1,000 mL (1,000 mLs Intravenous New Bag/Given 10/27/19 1530)    Initial Impression / Assessment and Plan / UC Course  I have reviewed the triage vital signs and the nursing notes.  Pertinent labs & imaging results that were available during my care of the patient were reviewed by me and considered in my medical decision making (see chart for details).    1.  Acute gastroenteritis likely viral: 1 L bolus normal saline Zofran as needed for  nausea/vomiting Repeat vitals after fluid bolus is complete CBC, BMP. Encourage oral fluid intake  2.  Vertigo likely secondary to viral gastroenteritis, currently resolved: Management as above  Repeat vitals after 1 L normal saline bolus was 115/64 with HR 95.  Patient endorses feeling better. No further episodes of nausea.  Final Clinical Impressions(s) / UC Diagnoses   Final diagnoses:  None   Discharge Instructions   None    ED Prescriptions    None     PDMP not reviewed this encounter.   Chase Picket, MD 10/27/19 1736    Chase Picket, MD 10/27/19 [phone]

## 2019-10-27 NOTE — ED Triage Notes (Signed)
Pt states when se woke up this morning she felt the room was spinning. Pt report she vomited 3 times this morning.Pt states she felt better after she ate oatmeal.  Pt called her PCP they told the pt to be seeing at the UC. Pt states she feels "woozy" at this moment.

## 2019-10-27 NOTE — Telephone Encounter (Signed)
FYI

## 2019-11-14 ENCOUNTER — Ambulatory Visit: Payer: Medicare HMO | Admitting: Family Medicine

## 2019-11-30 ENCOUNTER — Other Ambulatory Visit: Payer: Self-pay

## 2019-12-01 ENCOUNTER — Encounter: Payer: Self-pay | Admitting: Internal Medicine

## 2019-12-01 ENCOUNTER — Ambulatory Visit (INDEPENDENT_AMBULATORY_CARE_PROVIDER_SITE_OTHER): Payer: Medicare HMO | Admitting: Internal Medicine

## 2019-12-01 VITALS — BP 120/80 | HR 85 | Temp 97.9°F | Wt 174.6 lb

## 2019-12-01 DIAGNOSIS — E039 Hypothyroidism, unspecified: Secondary | ICD-10-CM | POA: Diagnosis not present

## 2019-12-01 DIAGNOSIS — I10 Essential (primary) hypertension: Secondary | ICD-10-CM

## 2019-12-01 DIAGNOSIS — K279 Peptic ulcer, site unspecified, unspecified as acute or chronic, without hemorrhage or perforation: Secondary | ICD-10-CM

## 2019-12-01 DIAGNOSIS — E1121 Type 2 diabetes mellitus with diabetic nephropathy: Secondary | ICD-10-CM | POA: Diagnosis not present

## 2019-12-01 DIAGNOSIS — G5702 Lesion of sciatic nerve, left lower limb: Secondary | ICD-10-CM | POA: Diagnosis not present

## 2019-12-01 DIAGNOSIS — N1831 Chronic kidney disease, stage 3a: Secondary | ICD-10-CM

## 2019-12-01 DIAGNOSIS — E7800 Pure hypercholesterolemia, unspecified: Secondary | ICD-10-CM

## 2019-12-01 LAB — POCT GLYCOSYLATED HEMOGLOBIN (HGB A1C): Hemoglobin A1C: 5.5 % (ref 4.0–5.6)

## 2019-12-01 LAB — TSH: TSH: 2.77 u[IU]/mL (ref 0.35–4.50)

## 2019-12-01 NOTE — Addendum Note (Signed)
Addended by: Isaiah Serge D on: 12/01/2019 08:03 AM   Modules accepted: Orders

## 2019-12-01 NOTE — Addendum Note (Signed)
Addended by: Westley Hummer B on: 12/01/2019 07:53 AM   Modules accepted: Orders

## 2019-12-01 NOTE — Patient Instructions (Signed)
-  Nice seeing you today!!  -Schedule follow up in 3 months. 

## 2019-12-01 NOTE — Progress Notes (Addendum)
Established Patient Office Visit     This visit occurred during the SARS-CoV-2 public health emergency.  Safety protocols were in place, including screening questions prior to the visit, additional usage of staff PPE, and extensive cleaning of exam room while observing appropriate contact time as indicated for disinfecting solutions.    CC/Reason for Visit: 51-month follow-up chronic conditions  HPI: Danielle Atkins is a 84 y.o. female who is coming in today for the above mentioned reasons. Past Medical History is significant for:  Gout, peptic ulcer disease, prior history of TIA, well-controlled diabetes, well-controlled hypertension, hypothyroidism, hyperlipidemia and chronic kidney disease.  She has no acute complaints today.  She looks much younger than her stated age.  Since I last saw her she had an ED visit on December 11 when she woke up dizzy and subsequently had multiple bouts of vomiting and diarrhea.  She was diagnosed with acute viral gastroenteritis and given IV fluids and Zofran and subsequently discharged home.  She has been doing well since.  She was also diagnosed with left-sided sciatica and piriformis syndrome by sports medicine and has been undergoing a physical therapy regimen and home exercises which have helped significantly.  Past Medical/Surgical History: Past Medical History:  Diagnosis Date  . ANEMIA-NOS 06/25/2007  . GOUT 11/16/2009  . PARESTHESIA, HANDS 03/27/2009  . PEPTIC ULCER DISEASE 06/25/2007  . RENAL INSUFFICIENCY 06/25/2007  . TRANSIENT ISCHEMIC ATTACK, HX OF 06/25/2007    No past surgical history on file.  Social History:  reports that she quit smoking about 19 years ago. She has never used smokeless tobacco. She reports that she does not drink alcohol or use drugs.  Allergies: No Known Allergies  Family History:  No history of heart disease, cancer, stroke that she is aware of  Current Outpatient Medications:  .  albuterol (PROAIR HFA)  108 (90 Base) MCG/ACT inhaler, Inhale 2 puffs into the lungs every 6 (six) hours as needed for wheezing., Disp: 18 g, Rfl: 2 .  allopurinol (ZYLOPRIM) 300 MG tablet, TAKE 1 TABLET EVERY DAY, Disp: 90 tablet, Rfl: 1 .  amLODipine (NORVASC) 5 MG tablet, TAKE 1 TABLET BY MOUTH EVERY DAY, Disp: 90 tablet, Rfl: 1 .  Ascorbic Acid (VITAMIN C) 100 MG tablet, Take 100 mg by mouth daily., Disp: , Rfl:  .  aspirin 81 MG tablet, Take 1 tablet (81 mg total) by mouth daily., Disp: 30 tablet, Rfl:  .  cholecalciferol (VITAMIN D3) 25 MCG (1000 UT) tablet, Take 1,000 Units by mouth daily., Disp: , Rfl:  .  diclofenac (VOLTAREN) 50 MG EC tablet, Take 1 tablet (50 mg total) by mouth 3 (three) times daily as needed. Take 1 tablet TID x 5 days then PRN for pain, Disp: 21 tablet, Rfl: 0 .  famotidine (PEPCID) 40 MG tablet, TAKE 1 TABLET EVERY DAY, Disp: 90 tablet, Rfl: 1 .  levothyroxine (SYNTHROID) 50 MCG tablet, Take 1 tablet (50 mcg total) by mouth daily before breakfast., Disp: 90 tablet, Rfl: 1 .  metFORMIN (GLUCOPHAGE) 500 MG tablet, TAKE 1 TABLET EVERY DAY  WITH  BREAKFAST, Disp: 90 tablet, Rfl: 4 .  Multiple Vitamin (MULTIVITAMIN WITH MINERALS) TABS, Take 1 tablet by mouth daily., Disp: , Rfl:  .  ondansetron (ZOFRAN ODT) 4 MG disintegrating tablet, Take 1 tablet (4 mg total) by mouth every 8 (eight) hours as needed for nausea or vomiting., Disp: 20 tablet, Rfl: 0 .  simvastatin (ZOCOR) 40 MG tablet, TAKE 1 TABLET EVERY DAY,  Disp: 90 tablet, Rfl: 1  Review of Systems:  Constitutional: Denies fever, chills, diaphoresis, appetite change and fatigue.  HEENT: Denies photophobia, eye pain, redness, hearing loss, ear pain, congestion, sore throat, rhinorrhea, sneezing, mouth sores, trouble swallowing, neck pain, neck stiffness and tinnitus.   Respiratory: Denies SOB, DOE, cough, chest tightness,  and wheezing.   Cardiovascular: Denies chest pain, palpitations and leg swelling.  Gastrointestinal: Denies nausea,  vomiting, abdominal pain, diarrhea, constipation, blood in stool and abdominal distention.  Genitourinary: Denies dysuria, urgency, frequency, hematuria, flank pain and difficulty urinating.  Endocrine: Denies: hot or cold intolerance, sweats, changes in hair or nails, polyuria, polydipsia. Musculoskeletal: Denies myalgias, back pain, joint swelling, arthralgias and gait problem.  Skin: Denies pallor, rash and wound.  Neurological: Denies dizziness, seizures, syncope, weakness, light-headedness, numbness and headaches.  Hematological: Denies adenopathy. Easy bruising, personal or family bleeding history  Psychiatric/Behavioral: Denies suicidal ideation, mood changes, confusion, nervousness, sleep disturbance and agitation    Physical Exam: Vitals:   12/01/19 0657  BP: 120/80  Pulse: 85  Temp: 97.9 F (36.6 C)  TempSrc: Temporal  SpO2: 95%  Weight: 174 lb 9.6 oz (79.2 kg)    Body mass index is 29.51 kg/m.   Constitutional: NAD, calm, comfortable Eyes: PERRL, lids and conjunctivae normal ENMT: Mucous membranes are moist.  Respiratory: clear to auscultation bilaterally, no wheezing, no crackles. Normal respiratory effort. No accessory muscle use.  Cardiovascular: Regular rate and rhythm, no murmurs / rubs / gallops. No extremity edema. 2+ pedal pulses.   Abdomen: no tenderness, no masses palpated. No hepatosplenomegaly. Bowel sounds positive.  Musculoskeletal: no clubbing / cyanosis. No joint deformity upper and lower extremities. Good ROM, no contractures. Normal muscle tone.  Skin: no rashes, lesions, ulcers. No induration Neurologic: Grossly intact and nonfocal Psychiatric: Normal judgment and insight. Alert and oriented x 3. Normal mood.    Impression and Plan:  Diabetes mellitus with diabetic nephropathy, without long-term current use of insulin ([place])  -Extremely well controlled with an A1c of 5.5 today.  Continue metformin once daily.  Piriformis syndrome,  left -Continue PT, home exercise regimen and follow-up with sports medicine as needed  Stage 3a chronic kidney disease -Baseline creatinine around 1.120, recheck in 6 months  Pure hypercholesterolemia -Last measured LDL was 70 in September 2020, continue simvastatin  Essential hypertension -Well-controlled on current regimen.  PUD (peptic ulcer disease) -Well-controlled on famotidine.  Hypothyroidism, unspecified type -In September her TSH was 4.69, her levothyroxine was increased from 25 to 50 mcg, she is due for repeat TSH.    Patient Instructions  -Nice seeing you today!!  -Schedule follow up in 3 months.      Lelon Frohlich, MD [facility]

## 2019-12-16 ENCOUNTER — Other Ambulatory Visit: Payer: Self-pay | Admitting: Internal Medicine

## 2019-12-16 DIAGNOSIS — I10 Essential (primary) hypertension: Secondary | ICD-10-CM

## 2020-01-03 ENCOUNTER — Other Ambulatory Visit: Payer: Self-pay | Admitting: Internal Medicine

## 2020-01-03 DIAGNOSIS — Z1231 Encounter for screening mammogram for malignant neoplasm of breast: Secondary | ICD-10-CM

## 2020-01-17 ENCOUNTER — Other Ambulatory Visit: Payer: Self-pay | Admitting: *Deleted

## 2020-01-17 DIAGNOSIS — I10 Essential (primary) hypertension: Secondary | ICD-10-CM

## 2020-01-17 MED ORDER — AMLODIPINE BESYLATE 5 MG PO TABS: 5.0000 mg | ORAL_TABLET | Freq: Every day | ORAL | 1 refills | Status: DC

## 2020-01-20 ENCOUNTER — Other Ambulatory Visit: Payer: Self-pay | Admitting: Internal Medicine

## 2020-01-20 DIAGNOSIS — E039 Hypothyroidism, unspecified: Secondary | ICD-10-CM

## 2020-02-03 ENCOUNTER — Other Ambulatory Visit: Payer: Self-pay | Admitting: Internal Medicine

## 2020-02-03 ENCOUNTER — Ambulatory Visit
Admission: RE | Admit: 2020-02-03 | Discharge: 2020-02-03 | Disposition: A | Discharge status: Home / Self Care | Payer: Medicare HMO | Source: Ambulatory Visit | Attending: Internal Medicine | Admitting: Internal Medicine

## 2020-02-03 ENCOUNTER — Other Ambulatory Visit: Payer: Self-pay

## 2020-02-03 DIAGNOSIS — E7800 Pure hypercholesterolemia, unspecified: Secondary | ICD-10-CM

## 2020-02-03 DIAGNOSIS — Z1231 Encounter for screening mammogram for malignant neoplasm of breast: Secondary | ICD-10-CM

## 2020-02-03 DIAGNOSIS — K279 Peptic ulcer, site unspecified, unspecified as acute or chronic, without hemorrhage or perforation: Secondary | ICD-10-CM

## 2020-02-17 DIAGNOSIS — I129 Hypertensive chronic kidney disease with stage 1 through stage 4 chronic kidney disease, or unspecified chronic kidney disease: Secondary | ICD-10-CM | POA: Diagnosis not present

## 2020-02-17 DIAGNOSIS — G459 Transient cerebral ischemic attack, unspecified: Secondary | ICD-10-CM | POA: Diagnosis not present

## 2020-02-17 DIAGNOSIS — D631 Anemia in chronic kidney disease: Secondary | ICD-10-CM | POA: Diagnosis not present

## 2020-02-17 DIAGNOSIS — E1122 Type 2 diabetes mellitus with diabetic chronic kidney disease: Secondary | ICD-10-CM | POA: Diagnosis not present

## 2020-02-17 DIAGNOSIS — N2581 Secondary hyperparathyroidism of renal origin: Secondary | ICD-10-CM | POA: Diagnosis not present

## 2020-02-17 DIAGNOSIS — N1831 Chronic kidney disease, stage 3a: Secondary | ICD-10-CM | POA: Diagnosis not present

## 2020-02-17 DIAGNOSIS — N189 Chronic kidney disease, unspecified: Secondary | ICD-10-CM | POA: Diagnosis not present

## 2020-02-21 ENCOUNTER — Other Ambulatory Visit: Payer: Self-pay | Admitting: Nephrology

## 2020-02-21 ENCOUNTER — Telehealth: Payer: Self-pay | Admitting: Internal Medicine

## 2020-02-21 DIAGNOSIS — N1831 Chronic kidney disease, stage 3a: Secondary | ICD-10-CM

## 2020-02-21 NOTE — Telephone Encounter (Signed)
Pt said she went to the [facility] last Friday  and they told her that her kidneys were fine and then she received a call from [facility] to schedule and she wants to know why if her Kidneys are ok. She wants to know if her Dr. Jerilee Hoh wants her to have this imaging done.

## 2020-02-22 NOTE — Telephone Encounter (Signed)
Patient is aware 

## 2020-02-29 ENCOUNTER — Ambulatory Visit
Admission: RE | Admit: 2020-02-29 | Discharge: 2020-02-29 | Disposition: A | Discharge status: Home / Self Care | Payer: Medicare HMO | Source: Ambulatory Visit | Attending: Nephrology | Admitting: Nephrology

## 2020-02-29 DIAGNOSIS — N189 Chronic kidney disease, unspecified: Secondary | ICD-10-CM | POA: Diagnosis not present

## 2020-02-29 DIAGNOSIS — N1831 Chronic kidney disease, stage 3a: Secondary | ICD-10-CM

## 2020-04-25 ENCOUNTER — Other Ambulatory Visit: Payer: Self-pay | Admitting: Internal Medicine

## 2020-04-25 DIAGNOSIS — I10 Essential (primary) hypertension: Secondary | ICD-10-CM

## 2020-05-01 ENCOUNTER — Telehealth: Payer: Self-pay | Admitting: Internal Medicine

## 2020-05-01 DIAGNOSIS — I10 Essential (primary) hypertension: Secondary | ICD-10-CM

## 2020-05-01 DIAGNOSIS — K279 Peptic ulcer, site unspecified, unspecified as acute or chronic, without hemorrhage or perforation: Secondary | ICD-10-CM

## 2020-05-01 DIAGNOSIS — E1121 Type 2 diabetes mellitus with diabetic nephropathy: Secondary | ICD-10-CM

## 2020-05-01 DIAGNOSIS — D509 Iron deficiency anemia, unspecified: Secondary | ICD-10-CM

## 2020-05-01 DIAGNOSIS — E7800 Pure hypercholesterolemia, unspecified: Secondary | ICD-10-CM

## 2020-05-01 DIAGNOSIS — E039 Hypothyroidism, unspecified: Secondary | ICD-10-CM

## 2020-05-01 DIAGNOSIS — N1831 Chronic kidney disease, stage 3a: Secondary | ICD-10-CM

## 2020-05-01 NOTE — Chronic Care Management (AMB) (Signed)
  Chronic Care Management   Note  05/01/2020 Name: Danielle Atkins MRN: [id number] DOB: 1935/06/17  Danielle Atkins is a 85 y.o. year old female who is a primary care patient of [name], [name], MD. I reached out to [name] by phone today in response to a referral sent by Danielle Atkins's PCP, [name], [name], MD.   Danielle Atkins was given information about Chronic Care Management services today including:  1. CCM service includes personalized support from designated clinical staff supervised by her physician, including individualized plan of care and coordination with other care providers 2. 24/7 contact phone numbers for assistance for urgent and routine care needs. 3. Service will only be billed when office clinical staff spend 20 minutes or more in a month to coordinate care. 4. Only one practitioner may furnish and bill the service in a calendar month. 5. The patient may stop CCM services at any time (effective at the end of the month) by phone call to the office staff.   Patient agreed to services and verbal consent obtained.   Follow up plan:   [place]

## 2020-05-23 ENCOUNTER — Other Ambulatory Visit: Payer: Self-pay | Admitting: Internal Medicine

## 2020-05-23 DIAGNOSIS — I10 Essential (primary) hypertension: Secondary | ICD-10-CM

## 2020-05-30 ENCOUNTER — Ambulatory Visit: Payer: Medicare HMO

## 2020-05-30 ENCOUNTER — Other Ambulatory Visit: Payer: Self-pay

## 2020-05-30 DIAGNOSIS — E7800 Pure hypercholesterolemia, unspecified: Secondary | ICD-10-CM

## 2020-05-30 DIAGNOSIS — I10 Essential (primary) hypertension: Secondary | ICD-10-CM

## 2020-05-30 DIAGNOSIS — E039 Hypothyroidism, unspecified: Secondary | ICD-10-CM

## 2020-05-30 DIAGNOSIS — K279 Peptic ulcer, site unspecified, unspecified as acute or chronic, without hemorrhage or perforation: Secondary | ICD-10-CM

## 2020-05-30 DIAGNOSIS — M1A9XX0 Chronic gout, unspecified, without tophus (tophi): Secondary | ICD-10-CM

## 2020-05-30 DIAGNOSIS — N1831 Chronic kidney disease, stage 3a: Secondary | ICD-10-CM

## 2020-05-30 DIAGNOSIS — E1121 Type 2 diabetes mellitus with diabetic nephropathy: Secondary | ICD-10-CM

## 2020-05-30 NOTE — Chronic Care Management (AMB) (Signed)
Chronic Care Management Pharmacy  Name: Danielle Atkins  MRN: [id number] DOB: Jul 12, 1935  Initial Questions: 1. Have you seen any other providers since your last visit? NA 2. Any changes in your medicines or health? No   Chief Complaint/ HPI  [address] [place],  85 y.o. , female presents for their Initial CCM visit with the clinical pharmacist via telephone due to COVID-19 Pandemic.  Patient endorses feeling well. She states she is careful when going out and has received both COVID vaccines. She mentions she used to travel with daughters to several countries and is not sure when she will get to doing it again.   PCP : [name], [name], MD  Their chronic conditions include: HTN, DM, HLD, Hx of TIA, Gout, Hypothyroidism, PUD, Piriformis syndrome, Low bone density, CKD stage 3  Office Visits: 12/01/2019- Lelon Frohlich, MD- Patient presented for office visit for 3 month follow up. No major interventions done. Patient is well controlled. Patient to obtain repeat TSH. Patient to follow up in 3 months.   Consult Visit: 02/17/2020- Nephrology- Edrick Oh- Patient presented for office visit. Unable to access notes.   09/28/2020- Sports medicine- Karlton Lemon, MD- Patient presented for office visit for left leg pain. Presentation consistent with piriformis syndrome. Patient to proceed with PT, APAP prn for pain. Piriformis stretching information given to patient. Also advised to use OTC voltaren gel or capsaicin cream for pain. Follow up in 6 to 8 weeks if no improvement.   Medications: Outpatient Encounter Medications as of 05/30/2020  Medication Sig Note  . albuterol (PROAIR HFA) 108 (90 Base) MCG/ACT inhaler Inhale 2 puffs into the lungs every 6 (six) hours as needed for wheezing.   Marland Kitchen allopurinol (ZYLOPRIM) 300 MG tablet TAKE 1 TABLET EVERY DAY   . amLODipine (NORVASC) 5 MG tablet TAKE 1 TABLET EVERY DAY   . Ascorbic Acid (VITAMIN C) 100 MG tablet Take 100 mg by  mouth daily.   Marland Kitchen aspirin 81 MG tablet Take 1 tablet (81 mg total) by mouth daily.   . cholecalciferol (VITAMIN D3) 25 MCG (1000 UT) tablet Take 1,000 Units by mouth daily.   . famotidine (PEPCID) 40 MG tablet TAKE 1 TABLET EVERY DAY   . levothyroxine (SYNTHROID) 50 MCG tablet TAKE 1/2 TABLET EVERY DAY BEFORE BREAKFAST   . metFORMIN (GLUCOPHAGE) 500 MG tablet TAKE 1 TABLET EVERY DAY  WITH  BREAKFAST   . Multiple Minerals-Vitamins ([facility] CALCIUM CITRATE+D3 W/MAGNE) TABS Take 1 tablet by mouth in the morning and at bedtime. 05/30/2020: Calcium citrate 1074m, Vitamin D3 128m Magnesium 50028m. Multiple Vitamin (MULTIVITAMIN WITH MINERALS) TABS Take 1 tablet by mouth daily.   . simvastatin (ZOCOR) 40 MG tablet TAKE 1 TABLET EVERY DAY   . diclofenac (VOLTAREN) 50 MG EC tablet Take 1 tablet (50 mg total) by mouth 3 (three) times daily as needed. Take 1 tablet TID x 5 days then PRN for pain (Patient not taking: Reported on 05/30/2020)   . ondansetron (ZOFRAN ODT) 4 MG disintegrating tablet Take 1 tablet (4 mg total) by mouth every 8 (eight) hours as needed for nausea or vomiting. (Patient not taking: Reported on 05/30/2020)    No facility-administered encounter medications on file as of 05/30/2020.     Current Diagnosis/Assessment:  Goals Addressed            This Visit's Progress   . Pharmacy Care Plan       CARE PLAN ENTRY  Current Barriers:  .  Chronic Disease Management support, education, and care coordination needs related to Hypertension, Hyperlipidemia, Diabetes, Chronic Kidney Disease, Hypothyroidism, Gout, and Peptic ulcer disease   Hypertension BP Readings from Last 3 Encounters:  12/01/19 120/80  10/27/19 115/64  09/29/19 129/80   . Pharmacist Clinical Goal(s): o Over the next 180 days, patient will work with PharmD and providers to maintain BP goal <130/80 . Current regimen:  o Amlodipine 44m, 1 tablet once daily  . Interventions: o We discussed modifying their lifestyle,  including initial attempts to lose 5 to 10% of body weight.   Engaging in at least 150 minutes per week of moderate-intensity exercise such as brisk walking (15- to 20-minute mile) or something similar. Recommended they incorporate flexibility, balance, and some type of strength training exercises.  Please begin slowly and build up gradually.  I discouraged prolonged sitting times.    . Patient self care activities - Over the next 180 days, patient will: o Check BP as instructed, document, and provide at future appointments o Ensure daily salt intake < 2300 mg/day  Hyperlipidemia Lab Results  Component Value Date/Time   LDLCALC 70 09/01/2019 07:38 AM   . Pharmacist Clinical Goal(s): o Over the next 180 days, patient will work with PharmD and providers to maintain LDL goal < 70 . Current regimen:  o Simvastatin 436m 1 tablet once daily  . Patient self care activities - Over the next 180 days, patient will: o Continue current medications as instructed.   Diabetes Lab Results  Component Value Date/Time   HGBA1C 5.5 12/01/2019 07:07 AM   HGBA1C 6.5 09/01/2019 07:38 AM   HGBA1C 5.7 (A) 03/01/2019 07:14 AM   HGBA1C 6.2 02/24/2018 09:25 AM   . Pharmacist Clinical Goal(s): o Over the next 180 days, patient will work with PharmD and providers to maintain A1c goal <7% . Current regimen:  o Metformin 500 mg, 1 tablet daily with breakfast . Patient self care activities - Over the next 180 days, patient will: o Continue current medications as instructed. .  Hypothyroidism TSH  Date Value Ref Range Status  12/01/2019 2.77 0.35 - 4.50 uIU/mL Final  09/01/2019 4.69 (H) 0.35 - 4.50 uIU/mL Final  02/24/2018 5.47 (H) 0.35 - 4.50 uIU/mL Final   . Pharmacist Clinical Goal(s) o Over the next 180 days, patient will work with PharmD and providers to maintain TSH: 0.35 - 4.50 uIU/mL . Current regimen:  o Levothyroxine 5066m 0.5 (one half) every day before breakfast  . Interventions: o We  discussed: we reviewed importance of maintaining administration consistent everyday and dose separate from other vitamins   . Patient self care activities - Over the next 180 days, patient will: o Continue levothyroxine before breakfast (at least 30 minutes before) and repeat thyroid level in December.   Gout Uric acid: 4.1 (07/12/2013)   . Pharmacist Clinical Goal(s) o Over the next 180 days, patient will work with PharmD and providers to continue allopurinol to prevent flare-ups.  . Current regimen:  o Allopurinol 300m73m tablet once daily  . Patient self care activities o Patient will continue current medications.   Peptic ulcer disease . Pharmacist Clinical Goal(s) o Over the next 180 days, patient will work with PharmD and providers to minimize flare-ups.  . Current regimen:  o Famotidine 40mg48mtablet once daily  . Patient self care activities o Patient will continue current medications.   Chronic kidney disease, stage 3  . Pharmacist Clinical Goal(s) o Over the next 180 days, patient will  work with PharmD and providers to maintain stable kidney function.  . Patient self care activities o Patient will continue maintain proper hydration.   Medication management . Pharmacist Clinical Goal(s): o Over the next 180 days, patient will work with PharmD and providers to maintain optimal medication adherence . Current pharmacy: [facility]  . Interventions o Comprehensive medication review performed. o Continue current medication management strategy . Patient self care activities - Over the next 180 days, patient will: o Take medications as prescribed o Report any questions or concerns to PharmD and/or provider(s)  Initial goal documentation        Diabetes   Recent Relevant Labs: Lab Results  Component Value Date/Time   HGBA1C 5.5 12/01/2019 07:07 AM   HGBA1C 6.5 09/01/2019 07:38 AM   HGBA1C 5.7 (A) 03/01/2019 07:14 AM   HGBA1C 6.2 02/24/2018  09:25 AM   MICROALBUR 5.9 (H) 10/01/2016 08:09 AM   MICROALBUR 5.1 (H) 04/08/2016 08:51 AM    Patient has failed these meds in past: none   Patient is currently controlled on the following medications:   Metformin 500 mg, 1 tablet daily with breakfast   Last diabetic Eye exam:  Lab Results  Component Value Date/Time   HMDIABEYEEXA No Retinopathy 03/12/2016 12:00 AM    Last diabetic Foot exam: No results found for: HMDIABFOOTEX   Plan Continue current medications   Hypertension   Office blood pressures are  BP Readings from Last 3 Encounters:  12/01/19 120/80  10/27/19 115/64  09/29/19 129/80   Patient has failed these meds in the past: lisinopril, losartan   Patient checks BP at home daily  Patient home BP readings are ranging: unable to provide exact readings (reports doing "well")   Patient is controlled on:   Amlodipine 69m, 1 tablet once daily   We discussed - exercising: patient reports she has started walking again at the park with a friend (she mentions [facility]aker [facility] but has stopped due to covid).   Plan Continue current medications   Hyperlipidemia   LDL goal < 70  Lipid Panel     Component Value Date/Time   CHOL 132 09/01/2019 0738   TRIG 103.0 09/01/2019 0738   HDL 42.20 09/01/2019 0738   LDLCALC 70 09/01/2019 0738    Hepatic Function Latest Ref Rng & Units 09/01/2019 09/22/2018 02/24/2018  Total Protein 6.0 - 8.3 g/dL 7.9 7.3 7.4  Albumin 3.5 - 5.2 g/dL 4.1 3.9 4.0  AST 0 - 37 U/L _0 ALT 0 - 35 U/L _1 Alk Phosphatase 39 - 117 U/L 59 49 53  Total Bilirubin 0.2 - 1.2 mg/dL 1.0 0.6 0.7  Bilirubin, Direct 0.0 - 0.3 mg/dL - - -     The ASCVD Risk score (Mikey BussingDC Jr., et al., 2013) failed to calculate for the following reasons:   The 2013 ASCVD risk score is only valid for ages 454to 73  Patient has failed these meds in past: none   Patient is currently controlled on the following medications:   . Simvastatin 434m 1 tablet once daily   We discussed:  diet and exercise extensively (see above).   Plan Continue current medications  History of TIA   Patient is currently controlled on the following medications:  . Aspirin 8155m1 tablet once daily   Plan Continue current medications  Hypothyroidism  Patient reported being confused since she previously received a prescription with directions  to take levothyroxine 56mg, 1 full tablet and mentions never been told to increase so she has continued on 0.5 tablet daily. Since then, TSH has been rechecked on 11/2019 and TSH is WNL.   Lab Results  Component Value Date/Time   TSH 2.77 12/01/2019 08:03 AM   TSH 4.69 (H) 09/01/2019 07:38 AM   TSH 5.47 (H) 02/24/2018 09:25 AM   Patient has failed these meds in past: none   Patient is currently controlled on the following medications:  . Levothyroxine 575m, 0.5 (one half) every day before breakfast   We discussed:   proper administration of levothyroxine.   we reviewed importance of maintaining administration consistent everyday and dosing separate from other vitamins    Plan Continue current medications  Gout   Uric acid: 4.1 (07/12/2013)   Patient was on these meds in past: colchicine   Patient is currently controlled on the following medications:   Allopurinol 30073m1 tablet once daily   Plan Continue current medications Consider dose decrease to 100m61mce daily   Piriformis syndrome    Patient is currently controlled on the following medications:  . Diclofenac 50mg34mtablet three times daily as needed for pain (currently not taking)  Plan Continue current medications   Peptic ulcer disease   Patient reports watching what she eats (avoiding trigger foods).   Patient is currently controlled on the following medications:  . Famotidine 40mg,67mablet once daily   Plan Continue current medications  Shortness of breath (per patient report)   No  diagnosis found for asthma, COPD, or other respiratory condition.   Patient notes she will use inhaler more often when it is hot and humid which exacerbates wheezing. But states overall she is doing well in regards to breathing.   Patient is currently controlled on the following medications:  . Albuterol HFA inhaler, 2 puffs every six hours as needed for wheezing   Plan Continue current medications  Bone health- low bone density/ osteopenia    Last DEXA Scan: 09/01/2019   T-Score femoral neck: RFN: -2.2; LFN: -2.0  T-Score lumbar spine: -1.8   10-year probability of major osteoporotic fracture: 7.6  10-year probability of hip fracture: 2.3%  VITD  Date Value Ref Range Status  09/01/2019 40.76 30.00 - 100.00 ng/mL Final    Patient is not a candidate for pharmacologic treatment  Patient is currently controlled on the following medications:   Vitamin D3 (cholecalciferol) 1000 units, 1 tablet once daily (at night)   Calcium citrate, vitamin D3, magnesium 1000mg, 66m, 560m, 1 31mets twice daily  Plan Continue current medications   OTC/ supplements    Patient is currently on the following medications:   Ascorbic acid 100mg, 1 t40mt once daily (at night)  . Multivitamin, 1 tablet once daily (at night)   Plan Continue current medications   CKD, Stage 3   Patient reports drinking "lots" of water everyday.   Kidney Function Lab Results  Component Value Date/Time   CREATININE 1.12 (H) 10/27/2019 03:30 PM   CREATININE 1.28 (H) 09/01/2019 07:38 AM   GFR 48.24 (L) 09/01/2019 07:38 AM   GFRNONAA 46 (L) 10/27/2019 03:30 PM   GFRAA 53 (L) 10/27/2019 03:30 PM   K 4.3 10/27/2019 03:30 PM   K 3.6 09/01/2019 07:38 AM  Kidney function stable.   Plan Continue to monitor and adjust medications as needed.    Medication Management  Patient organizes medications: patient reports using weekly pill box.   Primary pharmacy: [facility]  Adherence: no  gaps in refill history (per medication dispense history from 12/02/2019 to 05/30/2020)    Follow up Follow up visit with PharmD in 6 months.   Anson Crofts, PharmD Clinical Pharmacist [facility] [place] [phone]

## 2020-05-30 NOTE — Patient Instructions (Addendum)
Visit Information  Goals Addressed            This Visit's Progress   . Pharmacy Care Plan       CARE PLAN ENTRY  Current Barriers:  . Chronic Disease Management support, education, and care coordination needs related to Hypertension, Hyperlipidemia, Diabetes, Chronic Kidney Disease, Hypothyroidism, Gout, and Peptic ulcer disease   Hypertension BP Readings from Last 3 Encounters:  12/01/19 120/80  10/27/19 115/64  09/29/19 129/80   . Pharmacist Clinical Goal(s): o Over the next 180 days, patient will work with PharmD and providers to maintain BP goal <130/80 . Current regimen:  o Amlodipine 5mg , 1 tablet once daily  . Interventions: o We discussed modifying their lifestyle, including initial attempts to lose 5 to 10% of body weight.   Engaging in at least 150 minutes per week of moderate-intensity exercise such as brisk walking (15- to 20-minute mile) or something similar. Recommended they incorporate flexibility, balance, and some type of strength training exercises.  Please begin slowly and build up gradually.  I discouraged prolonged sitting times.    . Patient self care activities - Over the next 180 days, patient will: o Check BP as instructed, document, and provide at future appointments o Ensure daily salt intake < 2300 mg/day  Hyperlipidemia Lab Results  Component Value Date/Time   LDLCALC 70 09/01/2019 07:38 AM   . Pharmacist Clinical Goal(s): o Over the next 180 days, patient will work with PharmD and providers to maintain LDL goal < 70 . Current regimen:  o Simvastatin 40mg , 1 tablet once daily  . Patient self care activities - Over the next 180 days, patient will: o Continue current medications as instructed.   Diabetes Lab Results  Component Value Date/Time   HGBA1C 5.5 12/01/2019 07:07 AM   HGBA1C 6.5 09/01/2019 07:38 AM   HGBA1C 5.7 (A) 03/01/2019 07:14 AM   HGBA1C 6.2 02/24/2018 09:25 AM   . Pharmacist Clinical Goal(s): o Over the next 180 days,  patient will work with PharmD and providers to maintain A1c goal <7% . Current regimen:  o Metformin 500 mg, 1 tablet daily with breakfast . Patient self care activities - Over the next 180 days, patient will: o Continue current medications as instructed. .  Hypothyroidism TSH  Date Value Ref Range Status  12/01/2019 2.77 0.35 - 4.50 uIU/mL Final  09/01/2019 4.69 (H) 0.35 - 4.50 uIU/mL Final  02/24/2018 5.47 (H) 0.35 - 4.50 uIU/mL Final   . Pharmacist Clinical Goal(s) o Over the next 180 days, patient will work with PharmD and providers to maintain TSH: 0.35 - 4.50 uIU/mL . Current regimen:  o Levothyroxine 79mcg, 0.5 (one half) every day before breakfast  . Interventions: o We discussed: we reviewed importance of maintaining administration consistent everyday and dose separate from other vitamins   . Patient self care activities - Over the next 180 days, patient will: o Continue levothyroxine before breakfast (at least 30 minutes before) and repeat thyroid level in December.   Gout Uric acid: 4.1 (07/12/2013)   . Pharmacist Clinical Goal(s) o Over the next 180 days, patient will work with PharmD and providers to continue allopurinol to prevent flare-ups.  . Current regimen:  o Allopurinol 300mg , 1 tablet once daily  . Patient self care activities o Patient will continue current medications.   Peptic ulcer disease . Pharmacist Clinical Goal(s) o Over the next 180 days, patient will work with PharmD and providers to minimize flare-ups.  . Current regimen:  o  Famotidine 40mg , 1 tablet once daily  . Patient self care activities o Patient will continue current medications.   Chronic kidney disease, stage 3  . Pharmacist Clinical Goal(s) o Over the next 180 days, patient will work with PharmD and providers to maintain stable kidney function.  . Patient self care activities o Patient will continue maintain proper hydration.   Medication management . Pharmacist Clinical  Goal(s): o Over the next 180 days, patient will work with PharmD and providers to maintain optimal medication adherence . Current pharmacy: [facility]  . Interventions o Comprehensive medication review performed. o Continue current medication management strategy . Patient self care activities - Over the next 180 days, patient will: o Take medications as prescribed o Report any questions or concerns to PharmD and/or provider(s)  Initial goal documentation        Ms. Timoney was given information about Chronic Care Management services today including:  1. CCM service includes personalized support from designated clinical staff supervised by her physician, including individualized plan of care and coordination with other care providers 2. 24/7 contact phone numbers for assistance for urgent and routine care needs. 3. Standard insurance, coinsurance, copays and deductibles apply for chronic care management only during months in which we provide at least 20 minutes of these services. Most insurances cover these services at 100%, however patients may be responsible for any copay, coinsurance and/or deductible if applicable. This service may help you avoid the need for more expensive face-to-face services. 4. Only one practitioner may furnish and bill the service in a calendar month. 5. The patient may stop CCM services at any time (effective at the end of the month) by phone call to the office staff.  Patient agreed to services and verbal consent obtained.   The patient verbalized understanding of instructions provided today and agreed to receive a mailed copy of patient instruction and/or educational materials. Telephone follow up appointment with pharmacy team member scheduled for: 11/27/2020  Anson Crofts, PharmD Clinical Pharmacist [facility] [place] [phone]   [place] stands for "Dietary Approaches to Stop  Hypertension." The DASH eating plan is a healthy eating plan that has been shown to reduce high blood pressure (hypertension). It may also reduce your risk for type 2 diabetes, heart disease, and stroke. The DASH eating plan may also help with weight loss. What are tips for following this plan?  General guidelines  Avoid eating more than 2,300 mg (milligrams) of salt (sodium) a day. If you have hypertension, you may need to reduce your sodium intake to 1,500 mg a day.  Limit alcohol intake to no more than 1 drink a day for nonpregnant women and 2 drinks a day for men. One drink equals 12 oz of beer, 5 oz of wine, or 1 oz of hard liquor.  Work with your health care provider to maintain a healthy body weight or to lose weight. Ask what an ideal weight is for you.  Get at least 30 minutes of exercise that causes your heart to beat faster (aerobic exercise) most days of the week. Activities may include walking, swimming, or biking.  Work with your health care provider or diet and nutrition specialist (dietitian) to adjust your eating plan to your individual calorie needs. Reading food labels   Check food labels for the amount of sodium per serving. Choose foods with less than 5 percent of the Daily Value of sodium. Generally, foods with less than  300 mg of sodium per serving fit into this eating plan.  To find whole grains, look for the word "whole" as the first word in the ingredient list. Shopping  Buy products labeled as "low-sodium" or "no salt added."  Buy fresh foods. Avoid canned foods and premade or frozen meals. Cooking  Avoid adding salt when cooking. Use salt-free seasonings or herbs instead of table salt or sea salt. Check with your health care provider or pharmacist before using salt substitutes.  Do not fry foods. Cook foods using healthy methods such as baking, boiling, grilling, and broiling instead.  Cook with heart-healthy oils, such as olive, canola, soybean, or  sunflower oil. Meal planning  Eat a balanced diet that includes: ? 5 or more servings of fruits and vegetables each day. At each meal, try to fill half of your plate with fruits and vegetables. ? Up to 6-8 servings of whole grains each day. ? Less than 6 oz of lean meat, poultry, or fish each day. A 3-oz serving of meat is about the same size as a deck of cards. One egg equals 1 oz. ? 2 servings of low-fat dairy each day. ? A serving of nuts, seeds, or beans 5 times each week. ? Heart-healthy fats. Healthy fats called Omega-3 fatty acids are found in foods such as flaxseeds and coldwater fish, like sardines, salmon, and mackerel.  Limit how much you eat of the following: ? Canned or prepackaged foods. ? Food that is high in trans fat, such as fried foods. ? Food that is high in saturated fat, such as fatty meat. ? Sweets, desserts, sugary drinks, and other foods with added sugar. ? Full-fat dairy products.  Do not salt foods before eating.  Try to eat at least 2 vegetarian meals each week.  Eat more home-cooked food and less restaurant, buffet, and fast food.  When eating at a restaurant, ask that your food be prepared with less salt or no salt, if possible. What foods are recommended? The items listed may not be a complete list. Talk with your dietitian about what dietary choices are best for you. Grains Whole-grain or whole-wheat bread. Whole-grain or whole-wheat pasta. Brown rice. [place]. Bulgur. Whole-grain and low-sodium cereals. Pita bread. Low-fat, low-sodium crackers. Whole-wheat flour tortillas. Vegetables Fresh or frozen vegetables (raw, steamed, roasted, or grilled). Low-sodium or reduced-sodium tomato and vegetable juice. Low-sodium or reduced-sodium tomato sauce and tomato paste. Low-sodium or reduced-sodium canned vegetables. Fruits All fresh, dried, or frozen fruit. Canned fruit in natural juice (without added sugar). Meat and other protein foods Skinless  chicken or [place]. Ground chicken or [place]. Pork with fat trimmed off. Fish and seafood. Egg whites. Dried beans, peas, or lentils. Unsalted nuts, nut butters, and seeds. Unsalted canned beans. Lean cuts of beef with fat trimmed off. Low-sodium, lean deli meat. Dairy Low-fat (1%) or fat-free (skim) milk. Fat-free, low-fat, or reduced-fat cheeses. Nonfat, low-sodium ricotta or cottage cheese. Low-fat or nonfat yogurt. Low-fat, low-sodium cheese. Fats and oils Soft margarine without trans fats. Vegetable oil. Low-fat, reduced-fat, or light mayonnaise and salad dressings (reduced-sodium). Canola, safflower, olive, soybean, and sunflower oils. Avocado. Seasoning and other foods Herbs. Spices. Seasoning mixes without salt. Unsalted popcorn and pretzels. Fat-free sweets. What foods are not recommended? The items listed may not be a complete list. Talk with your dietitian about what dietary choices are best for you. Grains Baked goods made with fat, such as croissants, muffins, or some breads. Dry pasta or rice meal packs. Vegetables Creamed  or fried vegetables. Vegetables in a cheese sauce. Regular canned vegetables (not low-sodium or reduced-sodium). Regular canned tomato sauce and paste (not low-sodium or reduced-sodium). Regular tomato and vegetable juice (not low-sodium or reduced-sodium). Angie Fava. Olives. Fruits Canned fruit in a light or heavy syrup. Fried fruit. Fruit in cream or butter sauce. Meat and other protein foods Fatty cuts of meat. Ribs. Fried meat. Berniece Salines. Sausage. Bologna and other processed lunch meats. Salami. Fatback. Hotdogs. Bratwurst. Salted nuts and seeds. Canned beans with added salt. Canned or smoked fish. Whole eggs or egg yolks. Chicken or [place] with skin. Dairy Whole or 2% milk, cream, and half-and-half. Whole or full-fat cream cheese. Whole-fat or sweetened yogurt. Full-fat cheese. Nondairy creamers. Whipped toppings. Processed cheese and cheese spreads. Fats and  oils Butter. Stick margarine. Lard. Shortening. Ghee. Bacon fat. Tropical oils, such as coconut, palm kernel, or palm oil. Seasoning and other foods Salted popcorn and pretzels. Onion salt, garlic salt, seasoned salt, table salt, and sea salt. Worcestershire sauce. Tartar sauce. Barbecue sauce. Teriyaki sauce. Soy sauce, including reduced-sodium. Steak sauce. Canned and packaged gravies. Fish sauce. Oyster sauce. Cocktail sauce. Horseradish that you find on the shelf. Ketchup. Mustard. Meat flavorings and tenderizers. Bouillon cubes. Hot sauce and Tabasco sauce. Premade or packaged marinades. Premade or packaged taco seasonings. Relishes. Regular salad dressings. Where to find more information:  National Heart, Lung, and [place]: https://wilson-eaton.com/  American Heart Association: www.heart.org Summary  The DASH eating plan is a healthy eating plan that has been shown to reduce high blood pressure (hypertension). It may also reduce your risk for type 2 diabetes, heart disease, and stroke.  With the DASH eating plan, you should limit salt (sodium) intake to 2,300 mg a day. If you have hypertension, you may need to reduce your sodium intake to 1,500 mg a day.  When on the DASH eating plan, aim to eat more fresh fruits and vegetables, whole grains, lean proteins, low-fat dairy, and heart-healthy fats.  Work with your health care provider or diet and nutrition specialist (dietitian) to adjust your eating plan to your individual calorie needs. This information is not intended to replace advice given to you by your health care provider. Make sure you discuss any questions you have with your health care provider. Document Revised: 11/13/2017 Document Reviewed: 11/24/2016 Elsevier Patient Education  2020 [employer].

## 2020-05-30 NOTE — Addendum Note (Signed)
Addended by: Westley Hummer B on: 05/30/2020 09:59 AM   Modules accepted: Orders

## 2020-06-20 ENCOUNTER — Other Ambulatory Visit: Payer: Self-pay | Admitting: Internal Medicine

## 2020-06-20 DIAGNOSIS — E7800 Pure hypercholesterolemia, unspecified: Secondary | ICD-10-CM

## 2020-06-20 DIAGNOSIS — K279 Peptic ulcer, site unspecified, unspecified as acute or chronic, without hemorrhage or perforation: Secondary | ICD-10-CM

## 2020-06-27 ENCOUNTER — Encounter: Payer: Self-pay | Admitting: Internal Medicine

## 2020-06-27 ENCOUNTER — Telehealth (INDEPENDENT_AMBULATORY_CARE_PROVIDER_SITE_OTHER): Payer: Medicare HMO | Admitting: Internal Medicine

## 2020-06-27 ENCOUNTER — Other Ambulatory Visit: Payer: Self-pay

## 2020-06-27 VITALS — Wt 168.0 lb

## 2020-06-27 DIAGNOSIS — J069 Acute upper respiratory infection, unspecified: Secondary | ICD-10-CM

## 2020-06-27 NOTE — Progress Notes (Signed)
Virtual Visit via Telephone Note  I connected with Danielle Atkins on 06/27/20 at  3:30 PM EDT by telephone and verified that I am speaking with the correct person using two identifiers.   I discussed the limitations, risks, security and privacy concerns of performing an evaluation and management service by telephone and the availability of in person appointments. I also discussed with the patient that there may be a patient responsible charge related to this service. The patient expressed understanding and agreed to proceed.  Location patient: home Location provider: work office Participants present for the call: patient, provider Patient did not have a visit in the prior 7 days to address this/these issue(s).   History of Present Illness:  She has been experiencing a cough productive of yellow sputum for about a month now. She is walking with a friend in the mornings and it always gets worse afterwards. She has no other symptoms: fever, HA, SOB, runny nose, sinus pain/pressure, body aches. She is fully vaccinated against COVID.   Observations/Objective: Patient sounds cheerful and well on the phone. I do not appreciate any increased work of breathing. Speech and thought processing are grossly intact. Patient reported vitals: none reported   Current Outpatient Medications:  .  albuterol (PROAIR HFA) 108 (90 Base) MCG/ACT inhaler, Inhale 2 puffs into the lungs every 6 (six) hours as needed for wheezing., Disp: 18 g, Rfl: 2 .  allopurinol (ZYLOPRIM) 300 MG tablet, TAKE 1 TABLET EVERY DAY, Disp: 90 tablet, Rfl: 1 .  amLODipine (NORVASC) 5 MG tablet, TAKE 1 TABLET EVERY DAY, Disp: 90 tablet, Rfl: 0 .  Ascorbic Acid (VITAMIN C) 100 MG tablet, Take 100 mg by mouth daily., Disp: , Rfl:  .  aspirin 81 MG tablet, Take 1 tablet (81 mg total) by mouth daily., Disp: 30 tablet, Rfl:  .  cholecalciferol (VITAMIN D3) 25 MCG (1000 UT) tablet, Take 1,000 Units by mouth daily., Disp: , Rfl:    .  diclofenac (VOLTAREN) 50 MG EC tablet, Take 1 tablet (50 mg total) by mouth 3 (three) times daily as needed. Take 1 tablet TID x 5 days then PRN for pain, Disp: 21 tablet, Rfl: 0 .  famotidine (PEPCID) 40 MG tablet, TAKE 1 TABLET EVERY DAY, Disp: 90 tablet, Rfl: 1 .  levothyroxine (SYNTHROID) 50 MCG tablet, TAKE 1/2 TABLET EVERY DAY BEFORE BREAKFAST, Disp: 45 tablet, Rfl: 1 .  metFORMIN (GLUCOPHAGE) 500 MG tablet, TAKE 1 TABLET EVERY DAY  WITH  BREAKFAST, Disp: 90 tablet, Rfl: 4 .  Multiple Minerals-Vitamins ([facility] CALCIUM CITRATE+D3 W/MAGNE) TABS, Take 1 tablet by mouth in the morning and at bedtime., Disp: , Rfl:  .  Multiple Vitamin (MULTIVITAMIN WITH MINERALS) TABS, Take 1 tablet by mouth daily., Disp: , Rfl:  .  ondansetron (ZOFRAN ODT) 4 MG disintegrating tablet, Take 1 tablet (4 mg total) by mouth every 8 (eight) hours as needed for nausea or vomiting., Disp: 20 tablet, Rfl: 0 .  simvastatin (ZOCOR) 40 MG tablet, TAKE 1 TABLET EVERY DAY, Disp: 90 tablet, Rfl: 1  Review of Systems:  Constitutional: Denies fever, chills, diaphoresis, appetite change and fatigue.  HEENT: Denies photophobia, eye pain, redness, hearing loss, ear pain, congestion, sore throat, rhinorrhea, sneezing, mouth sores, trouble swallowing, neck pain, neck stiffness and tinnitus.   Respiratory: Denies SOB, DOE,  chest tightness,  and wheezing.   Cardiovascular: Denies chest pain, palpitations and leg swelling.  Gastrointestinal: Denies nausea, vomiting, abdominal pain, diarrhea, constipation, blood in stool and abdominal  distention.  Genitourinary: Denies dysuria, urgency, frequency, hematuria, flank pain and difficulty urinating.  Endocrine: Denies: hot or cold intolerance, sweats, changes in hair or nails, polyuria, polydipsia. Musculoskeletal: Denies myalgias, back pain, joint swelling, arthralgias and gait problem.  Skin: Denies pallor, rash and wound.  Neurological: Denies dizziness, seizures, syncope, weakness,  light-headedness, numbness and headaches.  Hematological: Denies adenopathy. Easy bruising, personal or family bleeding history  Psychiatric/Behavioral: Denies suicidal ideation, mood changes, confusion, nervousness, sleep disturbance and agitation   Assessment and Plan:  Upper respiratory tract infection, unspecified type -Given our discussion today, PNA, pharyngitis, ear infection are not likely, hence abx have not been prescribed. -Have advised rest, fluids, OTC antihistamines, cough suppressants and mucinex. -RTC if no improvement in 10-14 days.    I discussed the assessment and treatment plan with the patient. The patient was provided an opportunity to ask questions and all were answered. The patient agreed with the plan and demonstrated an understanding of the instructions.   The patient was advised to call back or seek an in-person evaluation if the symptoms worsen or if the condition fails to improve as anticipated.  I provided 13 minutes of non-face-to-face time during this encounter.   Lelon Frohlich, MD [facility]

## 2020-06-28 DIAGNOSIS — H401122 Primary open-angle glaucoma, left eye, moderate stage: Secondary | ICD-10-CM | POA: Diagnosis not present

## 2020-06-28 DIAGNOSIS — Z961 Presence of intraocular lens: Secondary | ICD-10-CM | POA: Diagnosis not present

## 2020-06-28 DIAGNOSIS — E119 Type 2 diabetes mellitus without complications: Secondary | ICD-10-CM | POA: Diagnosis not present

## 2020-06-28 DIAGNOSIS — H401111 Primary open-angle glaucoma, right eye, mild stage: Secondary | ICD-10-CM | POA: Diagnosis not present

## 2020-06-28 DIAGNOSIS — H02825 Cysts of left lower eyelid: Secondary | ICD-10-CM | POA: Diagnosis not present

## 2020-06-28 DIAGNOSIS — H04123 Dry eye syndrome of bilateral lacrimal glands: Secondary | ICD-10-CM | POA: Diagnosis not present

## 2020-06-28 LAB — HM DIABETES EYE EXAM

## 2020-07-04 ENCOUNTER — Encounter: Payer: Self-pay | Admitting: Internal Medicine

## 2020-07-12 ENCOUNTER — Other Ambulatory Visit: Payer: Self-pay | Admitting: Internal Medicine

## 2020-07-12 DIAGNOSIS — E039 Hypothyroidism, unspecified: Secondary | ICD-10-CM

## 2020-07-20 ENCOUNTER — Ambulatory Visit (INDEPENDENT_AMBULATORY_CARE_PROVIDER_SITE_OTHER): Payer: Medicare HMO | Admitting: Internal Medicine

## 2020-07-20 ENCOUNTER — Encounter: Payer: Self-pay | Admitting: Internal Medicine

## 2020-07-20 ENCOUNTER — Other Ambulatory Visit: Payer: Self-pay

## 2020-07-20 VITALS — BP 118/78 | HR 95 | Temp 97.5°F | Ht 64.5 in | Wt 165.8 lb

## 2020-07-20 DIAGNOSIS — N1831 Chronic kidney disease, stage 3a: Secondary | ICD-10-CM

## 2020-07-20 DIAGNOSIS — E7800 Pure hypercholesterolemia, unspecified: Secondary | ICD-10-CM | POA: Diagnosis not present

## 2020-07-20 DIAGNOSIS — M1A9XX0 Chronic gout, unspecified, without tophus (tophi): Secondary | ICD-10-CM

## 2020-07-20 DIAGNOSIS — I10 Essential (primary) hypertension: Secondary | ICD-10-CM | POA: Diagnosis not present

## 2020-07-20 DIAGNOSIS — E1121 Type 2 diabetes mellitus with diabetic nephropathy: Secondary | ICD-10-CM | POA: Diagnosis not present

## 2020-07-20 DIAGNOSIS — K279 Peptic ulcer, site unspecified, unspecified as acute or chronic, without hemorrhage or perforation: Secondary | ICD-10-CM

## 2020-07-20 LAB — POCT GLYCOSYLATED HEMOGLOBIN (HGB A1C): Hemoglobin A1C: 5.5 % (ref 4.0–5.6)

## 2020-07-20 NOTE — Progress Notes (Signed)
Established Patient Office Visit     This visit occurred during the SARS-CoV-2 public health emergency.  Safety protocols were in place, including screening questions prior to the visit, additional usage of staff PPE, and extensive cleaning of exam room while observing appropriate contact time as indicated for disinfecting solutions.    CC/Reason for Visit: 3-month follow-up chronic medical conditions  HPI: Danielle Atkins is a 85 y.o. female who is coming in today for the above mentioned reasons. Past Medical History is significant for: Gout, peptic ulcer disease, prior history of TIA, well-controlled diabetes, well-controlled hypertension, hypothyroidism, hyperlipidemia and chronic kidney disease. She has no acute complaints today. She looks much younger than her stated age.  Since I last saw her she has had no acute issues, she has received her Covid vaccines.   Past Medical/Surgical History: Past Medical History:  Diagnosis Date  . ANEMIA-NOS 06/25/2007  . GOUT 11/16/2009  . PARESTHESIA, HANDS 03/27/2009  . PEPTIC ULCER DISEASE 06/25/2007  . RENAL INSUFFICIENCY 06/25/2007  . TRANSIENT ISCHEMIC ATTACK, HX OF 06/25/2007    History reviewed. No pertinent surgical history.  Social History:  reports that she quit smoking about 20 years ago. She has never used smokeless tobacco. She reports that she does not drink alcohol and does not use drugs.  Allergies: No Known Allergies  Family History:  No history of heart disease, cancer, stroke that she is aware of  Current Outpatient Medications:  .  albuterol (PROAIR HFA) 108 (90 Base) MCG/ACT inhaler, Inhale 2 puffs into the lungs every 6 (six) hours as needed for wheezing., Disp: 18 g, Rfl: 2 .  allopurinol (ZYLOPRIM) 300 MG tablet, TAKE 1 TABLET EVERY DAY, Disp: 90 tablet, Rfl: 1 .  amLODipine (NORVASC) 5 MG tablet, TAKE 1 TABLET EVERY DAY, Disp: 90 tablet, Rfl: 0 .  Ascorbic Acid (VITAMIN C) 100 MG tablet, Take 1,000 mg by  mouth daily. Takes C Complex, Disp: , Rfl:  .  aspirin 81 MG tablet, Take 1 tablet (81 mg total) by mouth daily., Disp: 30 tablet, Rfl:  .  cholecalciferol (VITAMIN D3) 25 MCG (1000 UT) tablet, Take 5,000 Units by mouth daily. , Disp: , Rfl:  .  diclofenac (VOLTAREN) 50 MG EC tablet, Take 1 tablet (50 mg total) by mouth 3 (three) times daily as needed. Take 1 tablet TID x 5 days then PRN for pain, Disp: 21 tablet, Rfl: 0 .  famotidine (PEPCID) 40 MG tablet, TAKE 1 TABLET EVERY DAY, Disp: 90 tablet, Rfl: 1 .  latanoprost (XALATAN) 0.005 % ophthalmic solution, 1 drop daily., Disp: , Rfl:  .  levothyroxine (SYNTHROID) 50 MCG tablet, TAKE 1/2 TABLET EVERY DAY BEFORE BREAKFAST, Disp: 45 tablet, Rfl: 0 .  metFORMIN (GLUCOPHAGE) 500 MG tablet, TAKE 1 TABLET EVERY DAY  WITH  BREAKFAST, Disp: 90 tablet, Rfl: 4 .  Multiple Minerals-Vitamins ([employer] CALCIUM CITRATE+D3 W/MAGNE) TABS, Take 1 tablet by mouth in the morning and at bedtime., Disp: , Rfl:  .  Multiple Vitamin (MULTIVITAMIN WITH MINERALS) TABS, Take 1 tablet by mouth daily., Disp: , Rfl:  .  ondansetron (ZOFRAN ODT) 4 MG disintegrating tablet, Take 1 tablet (4 mg total) by mouth every 8 (eight) hours as needed for nausea or vomiting., Disp: 20 tablet, Rfl: 0 .  simvastatin (ZOCOR) 40 MG tablet, TAKE 1 TABLET EVERY DAY, Disp: 90 tablet, Rfl: 1  Review of Systems:  Constitutional: Denies fever, chills, diaphoresis, appetite change and fatigue.  HEENT: Denies photophobia, eye pain, redness,  hearing loss, ear pain, congestion, sore throat, rhinorrhea, sneezing, mouth sores, trouble swallowing, neck pain, neck stiffness and tinnitus.   Respiratory: Denies SOB, DOE, cough, chest tightness,  and wheezing.   Cardiovascular: Denies chest pain, palpitations and leg swelling.  Gastrointestinal: Denies nausea, vomiting, abdominal pain, diarrhea, constipation, blood in stool and abdominal distention.  Genitourinary: Denies dysuria, urgency, frequency, hematuria,  flank pain and difficulty urinating.  Endocrine: Denies: hot or cold intolerance, sweats, changes in hair or nails, polyuria, polydipsia. Musculoskeletal: Denies myalgias, back pain, joint swelling, arthralgias and gait problem.  Skin: Denies pallor, rash and wound.  Neurological: Denies dizziness, seizures, syncope, weakness, light-headedness, numbness and headaches.  Hematological: Denies adenopathy. Easy bruising, personal or family bleeding history  Psychiatric/Behavioral: Denies suicidal ideation, mood changes, confusion, nervousness, sleep disturbance and agitation    Physical Exam: Vitals:   07/20/20 0926  BP: 118/78  Pulse: 95  Temp: (!) 97.5 F (36.4 C)  TempSrc: Oral  SpO2: 94%  Weight: 165 lb 12.8 oz (75.2 kg)  Height: 5' 4.5" (1.638 m)    Body mass index is 28.02 kg/m.   Constitutional: NAD, calm, comfortable Eyes: PERRL, lids and conjunctivae normal ENMT: Mucous membranes are moist. Respiratory: clear to auscultation bilaterally, no wheezing, no crackles. Normal respiratory effort. No accessory muscle use.  Cardiovascular: Regular rate and rhythm, no murmurs / rubs / gallops. No extremity edema.  Neurologic: Grossly intact and nonfocal. Psychiatric: Normal judgment and insight. Alert and oriented x 3. Normal mood.    Impression and Plan:  Type II diabetes mellitus with nephropathy ([place])  -Well-controlled with an A1c of 5.5 today.  Pure hypercholesterolemia -At goal with the most recent LDL of 70 in September 2020  Chronic gout without tophus, unspecified cause, unspecified site -Stable, no recent flareups.  On daily allopurinol.  Essential hypertension -Well-controlled on current regimen.  PUD (peptic ulcer disease) -Well-controlled on daily H2 blockers  Stage 3a chronic kidney disease -Baseline creatinine has been stable at around 1.3.     Patient Instructions  -Nice seeing you today!!  -Keep up the good work!  See you back in 3 months for  your physical. Please come in fasting.     Lelon Frohlich, MD [facility]

## 2020-07-20 NOTE — Patient Instructions (Addendum)
-  Nice seeing you today!!  -Keep up the good work!  See you back in 3 months for your physical. Please come in fasting.

## 2020-07-26 ENCOUNTER — Other Ambulatory Visit: Payer: Self-pay | Admitting: Internal Medicine

## 2020-07-26 DIAGNOSIS — H401122 Primary open-angle glaucoma, left eye, moderate stage: Secondary | ICD-10-CM | POA: Diagnosis not present

## 2020-07-26 DIAGNOSIS — H401111 Primary open-angle glaucoma, right eye, mild stage: Secondary | ICD-10-CM | POA: Diagnosis not present

## 2020-07-26 DIAGNOSIS — I10 Essential (primary) hypertension: Secondary | ICD-10-CM

## 2020-07-30 ENCOUNTER — Other Ambulatory Visit: Payer: Self-pay | Admitting: Internal Medicine

## 2020-07-30 DIAGNOSIS — I10 Essential (primary) hypertension: Secondary | ICD-10-CM

## 2020-09-10 ENCOUNTER — Ambulatory Visit (HOSPITAL_COMMUNITY)
Admission: EM | Admit: 2020-09-10 | Discharge: 2020-09-10 | Disposition: A | Discharge status: Home / Self Care | Payer: Medicare HMO | Attending: Family Medicine | Admitting: Family Medicine

## 2020-09-10 ENCOUNTER — Encounter (HOSPITAL_COMMUNITY): Payer: Self-pay | Admitting: *Deleted

## 2020-09-10 ENCOUNTER — Other Ambulatory Visit: Payer: Self-pay

## 2020-09-10 ENCOUNTER — Emergency Department (HOSPITAL_COMMUNITY)
Admission: EM | Admit: 2020-09-10 | Discharge: 2020-09-11 | Disposition: A | Discharge status: Home / Self Care | Payer: Medicare HMO | Source: Home / Self Care

## 2020-09-10 ENCOUNTER — Other Ambulatory Visit: Payer: Self-pay | Admitting: Internal Medicine

## 2020-09-10 ENCOUNTER — Encounter (HOSPITAL_COMMUNITY): Payer: Self-pay

## 2020-09-10 DIAGNOSIS — R0600 Dyspnea, unspecified: Secondary | ICD-10-CM | POA: Diagnosis not present

## 2020-09-10 DIAGNOSIS — E1122 Type 2 diabetes mellitus with diabetic chronic kidney disease: Secondary | ICD-10-CM | POA: Diagnosis present

## 2020-09-10 DIAGNOSIS — N280 Ischemia and infarction of kidney: Secondary | ICD-10-CM | POA: Diagnosis present

## 2020-09-10 DIAGNOSIS — R0902 Hypoxemia: Secondary | ICD-10-CM | POA: Diagnosis not present

## 2020-09-10 DIAGNOSIS — R638 Other symptoms and signs concerning food and fluid intake: Secondary | ICD-10-CM | POA: Diagnosis not present

## 2020-09-10 DIAGNOSIS — E039 Hypothyroidism, unspecified: Secondary | ICD-10-CM

## 2020-09-10 DIAGNOSIS — Z20822 Contact with and (suspected) exposure to covid-19: Secondary | ICD-10-CM | POA: Diagnosis present

## 2020-09-10 DIAGNOSIS — J9811 Atelectasis: Secondary | ICD-10-CM | POA: Diagnosis present

## 2020-09-10 DIAGNOSIS — J810 Acute pulmonary edema: Secondary | ICD-10-CM | POA: Diagnosis not present

## 2020-09-10 DIAGNOSIS — I071 Rheumatic tricuspid insufficiency: Secondary | ICD-10-CM | POA: Diagnosis present

## 2020-09-10 DIAGNOSIS — I50811 Acute right heart failure: Secondary | ICD-10-CM | POA: Diagnosis not present

## 2020-09-10 DIAGNOSIS — I7103 Dissection of thoracoabdominal aorta: Secondary | ICD-10-CM | POA: Diagnosis not present

## 2020-09-10 DIAGNOSIS — I129 Hypertensive chronic kidney disease with stage 1 through stage 4 chronic kidney disease, or unspecified chronic kidney disease: Secondary | ICD-10-CM | POA: Diagnosis not present

## 2020-09-10 DIAGNOSIS — R570 Cardiogenic shock: Secondary | ICD-10-CM | POA: Diagnosis present

## 2020-09-10 DIAGNOSIS — K5939 Other megacolon: Secondary | ICD-10-CM | POA: Diagnosis not present

## 2020-09-10 DIAGNOSIS — J9601 Acute respiratory failure with hypoxia: Secondary | ICD-10-CM | POA: Diagnosis not present

## 2020-09-10 DIAGNOSIS — I517 Cardiomegaly: Secondary | ICD-10-CM | POA: Diagnosis not present

## 2020-09-10 DIAGNOSIS — Z48812 Encounter for surgical aftercare following surgery on the circulatory system: Secondary | ICD-10-CM | POA: Diagnosis not present

## 2020-09-10 DIAGNOSIS — N179 Acute kidney failure, unspecified: Secondary | ICD-10-CM | POA: Diagnosis present

## 2020-09-10 DIAGNOSIS — I4510 Unspecified right bundle-branch block: Secondary | ICD-10-CM | POA: Diagnosis not present

## 2020-09-10 DIAGNOSIS — J90 Pleural effusion, not elsewhere classified: Secondary | ICD-10-CM | POA: Diagnosis not present

## 2020-09-10 DIAGNOSIS — R42 Dizziness and giddiness: Secondary | ICD-10-CM | POA: Diagnosis not present

## 2020-09-10 DIAGNOSIS — R0602 Shortness of breath: Secondary | ICD-10-CM | POA: Insufficient documentation

## 2020-09-10 DIAGNOSIS — R4182 Altered mental status, unspecified: Secondary | ICD-10-CM | POA: Insufficient documentation

## 2020-09-10 DIAGNOSIS — I712 Thoracic aortic aneurysm, without rupture: Secondary | ICD-10-CM | POA: Diagnosis not present

## 2020-09-10 DIAGNOSIS — I361 Nonrheumatic tricuspid (valve) insufficiency: Secondary | ICD-10-CM | POA: Diagnosis not present

## 2020-09-10 DIAGNOSIS — Z5321 Procedure and treatment not carried out due to patient leaving prior to being seen by health care provider: Secondary | ICD-10-CM | POA: Insufficient documentation

## 2020-09-10 DIAGNOSIS — J45909 Unspecified asthma, uncomplicated: Secondary | ICD-10-CM | POA: Insufficient documentation

## 2020-09-10 DIAGNOSIS — I7100 Dissection of unspecified site of aorta: Secondary | ICD-10-CM | POA: Diagnosis not present

## 2020-09-10 DIAGNOSIS — I10 Essential (primary) hypertension: Secondary | ICD-10-CM | POA: Diagnosis not present

## 2020-09-10 DIAGNOSIS — Z4682 Encounter for fitting and adjustment of non-vascular catheter: Secondary | ICD-10-CM | POA: Diagnosis not present

## 2020-09-10 DIAGNOSIS — I2721 Secondary pulmonary arterial hypertension: Secondary | ICD-10-CM | POA: Diagnosis present

## 2020-09-10 DIAGNOSIS — R000 Tachycardia, unspecified: Secondary | ICD-10-CM | POA: Diagnosis not present

## 2020-09-10 DIAGNOSIS — R4189 Other symptoms and signs involving cognitive functions and awareness: Secondary | ICD-10-CM

## 2020-09-10 DIAGNOSIS — K6389 Other specified diseases of intestine: Secondary | ICD-10-CM | POA: Diagnosis not present

## 2020-09-10 DIAGNOSIS — I509 Heart failure, unspecified: Secondary | ICD-10-CM | POA: Diagnosis not present

## 2020-09-10 DIAGNOSIS — E785 Hyperlipidemia, unspecified: Secondary | ICD-10-CM | POA: Diagnosis present

## 2020-09-10 DIAGNOSIS — I313 Pericardial effusion (noninflammatory): Secondary | ICD-10-CM | POA: Diagnosis not present

## 2020-09-10 DIAGNOSIS — R079 Chest pain, unspecified: Secondary | ICD-10-CM | POA: Diagnosis not present

## 2020-09-10 DIAGNOSIS — I719 Aortic aneurysm of unspecified site, without rupture: Secondary | ICD-10-CM | POA: Diagnosis not present

## 2020-09-10 DIAGNOSIS — I50810 Right heart failure, unspecified: Secondary | ICD-10-CM | POA: Diagnosis not present

## 2020-09-10 DIAGNOSIS — D62 Acute posthemorrhagic anemia: Secondary | ICD-10-CM | POA: Diagnosis not present

## 2020-09-10 DIAGNOSIS — I312 Hemopericardium, not elsewhere classified: Secondary | ICD-10-CM | POA: Diagnosis not present

## 2020-09-10 DIAGNOSIS — E119 Type 2 diabetes mellitus without complications: Secondary | ICD-10-CM | POA: Diagnosis not present

## 2020-09-10 DIAGNOSIS — N183 Chronic kidney disease, stage 3 unspecified: Secondary | ICD-10-CM | POA: Diagnosis present

## 2020-09-10 DIAGNOSIS — M79673 Pain in unspecified foot: Secondary | ICD-10-CM | POA: Insufficient documentation

## 2020-09-10 DIAGNOSIS — J811 Chronic pulmonary edema: Secondary | ICD-10-CM | POA: Diagnosis not present

## 2020-09-10 DIAGNOSIS — D6959 Other secondary thrombocytopenia: Secondary | ICD-10-CM | POA: Diagnosis present

## 2020-09-10 DIAGNOSIS — J189 Pneumonia, unspecified organism: Secondary | ICD-10-CM | POA: Diagnosis not present

## 2020-09-10 DIAGNOSIS — I351 Nonrheumatic aortic (valve) insufficiency: Secondary | ICD-10-CM | POA: Diagnosis present

## 2020-09-10 DIAGNOSIS — I7101 Dissection of thoracic aorta: Secondary | ICD-10-CM | POA: Diagnosis present

## 2020-09-10 DIAGNOSIS — E876 Hypokalemia: Secondary | ICD-10-CM | POA: Diagnosis not present

## 2020-09-10 DIAGNOSIS — N184 Chronic kidney disease, stage 4 (severe): Secondary | ICD-10-CM | POA: Diagnosis not present

## 2020-09-10 DIAGNOSIS — I471 Supraventricular tachycardia: Secondary | ICD-10-CM | POA: Diagnosis present

## 2020-09-10 DIAGNOSIS — I50813 Acute on chronic right heart failure: Secondary | ICD-10-CM | POA: Diagnosis not present

## 2020-09-10 DIAGNOSIS — R531 Weakness: Secondary | ICD-10-CM | POA: Diagnosis not present

## 2020-09-10 DIAGNOSIS — J449 Chronic obstructive pulmonary disease, unspecified: Secondary | ICD-10-CM | POA: Diagnosis not present

## 2020-09-10 DIAGNOSIS — J439 Emphysema, unspecified: Secondary | ICD-10-CM | POA: Diagnosis present

## 2020-09-10 DIAGNOSIS — E1165 Type 2 diabetes mellitus with hyperglycemia: Secondary | ICD-10-CM | POA: Diagnosis present

## 2020-09-10 DIAGNOSIS — Z452 Encounter for adjustment and management of vascular access device: Secondary | ICD-10-CM | POA: Diagnosis not present

## 2020-09-10 DIAGNOSIS — Z23 Encounter for immunization: Secondary | ICD-10-CM | POA: Diagnosis present

## 2020-09-10 DIAGNOSIS — I130 Hypertensive heart and chronic kidney disease with heart failure and stage 1 through stage 4 chronic kidney disease, or unspecified chronic kidney disease: Secondary | ICD-10-CM | POA: Diagnosis present

## 2020-09-10 LAB — POCT URINALYSIS DIPSTICK, ED / UC
Glucose, UA: NEGATIVE mg/dL
Ketones, ur: 15 mg/dL — AB
Leukocytes,Ua: NEGATIVE
Nitrite: NEGATIVE
Protein, ur: >=300 mg/dL — AB
Specific Gravity, Urine: >=1.03 (ref 1.005–1.030)
Urobilinogen, UA: 1 mg/dL (ref 0.0–1.0)
pH: 5.5 (ref 5.0–8.0)

## 2020-09-10 LAB — COMPREHENSIVE METABOLIC PANEL
ALT: 11 U/L (ref 0–44)
AST: 21 U/L (ref 15–41)
Albumin: 3.5 g/dL (ref 3.5–5.0)
Alkaline Phosphatase: 53 U/L (ref 38–126)
Anion gap: 16 — ABNORMAL HIGH (ref 5–15)
BUN: 16 mg/dL (ref 8–23)
CO2: 22 mmol/L (ref 22–32)
Calcium: 9.2 mg/dL (ref 8.9–10.3)
Chloride: 102 mmol/L (ref 98–111)
Creatinine, Ser: 1.88 mg/dL — ABNORMAL HIGH (ref 0.44–1.00)
GFR calc Af Amer: 28 mL/min — ABNORMAL LOW (ref >60)
GFR calc non Af Amer: 24 mL/min — ABNORMAL LOW (ref >60)
Glucose, Bld: 197 mg/dL — ABNORMAL HIGH (ref 70–99)
Potassium: 3.8 mmol/L (ref 3.5–5.1)
Sodium: 140 mmol/L (ref 135–145)
Total Bilirubin: 1 mg/dL (ref 0.3–1.2)
Total Protein: 8.3 g/dL — ABNORMAL HIGH (ref 6.5–8.1)

## 2020-09-10 LAB — CBC
HCT: 38 % (ref 36.0–46.0)
Hemoglobin: 11.2 g/dL — ABNORMAL LOW (ref 12.0–15.0)
MCH: 21.2 pg — ABNORMAL LOW (ref 26.0–34.0)
MCHC: 29.5 g/dL — ABNORMAL LOW (ref 30.0–36.0)
MCV: 71.8 fL — ABNORMAL LOW (ref 80.0–100.0)
Platelets: 192 10*3/uL (ref 150–400)
RBC: 5.29 MIL/uL — ABNORMAL HIGH (ref 3.87–5.11)
RDW: 19.5 % — ABNORMAL HIGH (ref 11.5–15.5)
WBC: 9.3 10*3/uL (ref 4.0–10.5)
nRBC: 0 % (ref 0.0–0.2)

## 2020-09-10 LAB — CBG MONITORING, ED: Glucose-Capillary: 103 mg/dL — ABNORMAL HIGH (ref 70–99)

## 2020-09-10 NOTE — ED Provider Notes (Signed)
[place]    CSN: [id number] Arrival date & time: 09/10/20  1328      History   Chief Complaint Chief Complaint  Patient presents with  . Foot Pain    burning on bottom started this am    HPI Danielle Atkins is a 85 y.o. female.   [name] presents with complaints of episode today around 11 am in which she was disoriented, she felt dizzy, her speech was slurred. She lives with her son who noted it. States she was away that her speech was altered as well. She feels the symptoms may have lasted around 5-10 minutes, not sure specifically. Now feels like she has burning to her feet, but otherwise seems like her symptoms have improved. She has not taken her medications or eaten today. Has had a TIA in the past, when she lived in [place], she doesn't know what her symptoms were at that time. Her son brought her to UC today. She doesn't have current dizziness. No chest pain .    ROS per HPI, negative if not otherwise mentioned.      Past Medical History:  Diagnosis Date  . ANEMIA-NOS 06/25/2007  . GOUT 11/16/2009  . PARESTHESIA, HANDS 03/27/2009  . PEPTIC ULCER DISEASE 06/25/2007  . RENAL INSUFFICIENCY 06/25/2007  . TRANSIENT ISCHEMIC ATTACK, HX OF 06/25/2007    Patient Active Problem List   Diagnosis Date Noted  . Piriformis syndrome, left 09/29/2019  . CKD (chronic kidney disease) stage 3, GFR 30-59 ml/min 09/01/2019  . Diabetes mellitus with renal complications ([place]) 65/46/5035  . GOUT 11/16/2009  . PARESTHESIA, HANDS 03/27/2009  . Pure hypercholesterolemia 06/25/2007  . Microcytic anemia 06/25/2007  . Essential hypertension 06/25/2007  . PUD (peptic ulcer disease) 06/25/2007  . Disorder resulting from impaired renal function 06/25/2007  . History of cardiovascular disorder 06/25/2007    History reviewed. No pertinent surgical history.  OB History   No obstetric history on file.      Home Medications    Prior to Admission medications    Medication Sig Start Date End Date Taking? Authorizing Provider  albuterol (PROAIR HFA) 108 (90 Base) MCG/ACT inhaler Inhale 2 puffs into the lungs every 6 (six) hours as needed for wheezing. 12/28/18  Yes [name], MD  allopurinol (ZYLOPRIM) 300 MG tablet TAKE 1 TABLET EVERY DAY 07/27/20  Yes [name], [name], MD  amLODipine (NORVASC) 5 MG tablet TAKE 1 TABLET EVERY DAY 07/31/20  Yes [name], [name], MD  aspirin 81 MG tablet Take 1 tablet (81 mg total) by mouth daily. 06/29/14  Yes [name], MD  cholecalciferol (VITAMIN D3) 25 MCG (1000 UT) tablet Take 5,000 Units by mouth daily.    Yes [provider]  famotidine (PEPCID) 40 MG tablet TAKE 1 TABLET EVERY DAY 06/21/20  Yes [name], [name], MD  levothyroxine (SYNTHROID) 50 MCG tablet TAKE 1/2 TABLET EVERY DAY BEFORE BREAKFAST 07/12/20  Yes [name], [name], MD  metFORMIN (GLUCOPHAGE) 500 MG tablet TAKE 1 TABLET EVERY DAY  WITH  BREAKFAST 04/25/20  Yes [name], [name], MD  Ascorbic Acid (VITAMIN C) 100 MG tablet Take 1,000 mg by mouth daily. Takes C Complex    [provider]  diclofenac (VOLTAREN) 50 MG EC tablet Take 1 tablet (50 mg total) by mouth 3 (three) times daily as needed. Take 1 tablet TID x 5 days then PRN for pain 09/22/19   Enrique Sack, FNP  latanoprost (  XALATAN) 0.005 % ophthalmic solution 1 drop daily. 06/28/20   [provider]  Multiple Minerals-Vitamins ([employer] CALCIUM CITRATE+D3 W/MAGNE) TABS Take 1 tablet by mouth in the morning and at bedtime.    [provider]  Multiple Vitamin (MULTIVITAMIN WITH MINERALS) TABS Take 1 tablet by mouth daily.    [provider]  ondansetron (ZOFRAN ODT) 4 MG disintegrating tablet Take 1 tablet (4 mg total) by mouth every 8 (eight) hours as needed for nausea or vomiting. 10/27/19   Chase Picket, MD  simvastatin (ZOCOR) 40 MG tablet TAKE 1 TABLET EVERY DAY 06/21/20   [name], [name], MD    Family History History reviewed. No pertinent family history.  Social History Social History   Tobacco Use  . Smoking status: Former Smoker    Quit date: 12/16/1999    Years since quitting: 20.7  . Smokeless tobacco: Never Used  Vaping Use  . Vaping Use: Never used  Substance Use Topics  . Alcohol use: No  . Drug use: No     Allergies   Patient has no known allergies.   Review of Systems Review of Systems   Physical Exam Triage Vital Signs ED Triage Vitals  Enc Vitals Group     BP 09/10/20 1437 107/60     Pulse Rate 09/10/20 1437 89     Resp 09/10/20 1437 18     Temp 09/10/20 1437 98.4 F (36.9 C)     Temp src --      SpO2 09/10/20 1437 98 %     Weight --      Height --      Head Circumference --      Peak Flow --      Pain Score 09/10/20 1440 0     Pain Loc --      Pain Edu? --      Excl. in [place]? --    No data found.  Updated Vital Signs BP 107/60 (BP Location: Right Arm)   Pulse 89   Temp 98.4 F (36.9 C)   Resp 18   SpO2 98%   Visual Acuity Right Eye Distance:   Left Eye Distance:   Bilateral Distance:    Right Eye Near:   Left Eye Near:    Bilateral Near:     Physical Exam Constitutional:      General: She is not in acute distress.    Appearance: She is well-developed.  Eyes:     Extraocular Movements: Extraocular movements intact.     Pupils: Pupils are equal, round, and reactive to light.  Cardiovascular:     Rate and Rhythm: Normal rate.  Pulmonary:     Effort: Pulmonary effort is normal.  Skin:    General: Skin is warm and dry.  Neurological:     General: No focal deficit present.     Mental Status: She is alert and oriented to person, place, and time. Mental status is at baseline.     Cranial Nerves: No cranial nerve deficit or facial asymmetry.     Sensory: Sensation is intact. No sensory deficit.     Motor: No weakness.     Coordination: Coordination is intact. Coordination normal.  Finger-Nose-Finger Test and Heel to [facility] Test normal.     Gait: Gait normal.      UC Treatments / Results  Labs (all labs ordered are listed, but only abnormal results are displayed) Labs Reviewed  POCT URINALYSIS DIPSTICK, ED / UC  CBG MONITORING, ED  CBG MONITORING, ED    EKG   Radiology No results found.  Procedures Procedures (including critical care time)  Medications Ordered in UC Medications - No data to display  Initial Impression / Assessment and Plan / UC Course  I have reviewed the triage vital signs and the nursing notes.  Pertinent labs & imaging results that were available during my care of the patient were reviewed by me and considered in my medical decision making (see chart for details).     Episode this morning of confusion, dizziness, slurred speech. Has seemingly resolved at this time without any apparent neurological findings. Concern for TIA. Recommend further evaluation in the ER at this time. Patient agreeable. Her son will transport her now.  Final Clinical Impressions(s) / UC Diagnoses   Final diagnoses:  Episode of altered cognition     Discharge Instructions     Due to the episode you have described which occurred earlier today I recommend you seek further evaluation in the ER now.    ED Prescriptions    None     PDMP not reviewed this encounter.   Zigmund Gottron, NP 09/10/20 1528

## 2020-09-10 NOTE — Discharge Instructions (Addendum)
Due to the episode you have described which occurred earlier today I recommend you seek further evaluation in the ER now.

## 2020-09-10 NOTE — ED Triage Notes (Signed)
Pt has several complaints. Appears sob but patient states that is normal for her due to asthma. Was checked in for burning pain to bottom of her feet but then reports waking up this am being disoriented. Is appropriate at triage.

## 2020-09-10 NOTE — ED Triage Notes (Signed)
Pt states her soon advised she was disoriented this am. Pt knows her name, dob, date, and approximate time. Pt complains of bilaterally the bottoms of her feet burning. PT is aox4 at this time and ambulates with some pain.

## 2020-09-11 NOTE — ED Notes (Signed)
Pt called 2x no reponse

## 2020-09-11 NOTE — ED Notes (Signed)
Pt called 1x no answer 

## 2020-09-13 ENCOUNTER — Emergency Department (HOSPITAL_COMMUNITY): Payer: Medicare HMO | Admitting: Anesthesiology

## 2020-09-13 ENCOUNTER — Emergency Department (HOSPITAL_BASED_OUTPATIENT_CLINIC_OR_DEPARTMENT_OTHER): Payer: Medicare HMO

## 2020-09-13 ENCOUNTER — Encounter (HOSPITAL_COMMUNITY)
Admission: EM | Disposition: A | Discharge status: Skilled Nursing Facility | Payer: Self-pay | Source: Home / Self Care | Attending: Cardiothoracic Surgery

## 2020-09-13 ENCOUNTER — Other Ambulatory Visit: Payer: Self-pay

## 2020-09-13 ENCOUNTER — Inpatient Hospital Stay (HOSPITAL_BASED_OUTPATIENT_CLINIC_OR_DEPARTMENT_OTHER)
Admission: EM | Admit: 2020-09-13 | Discharge: 2020-10-06 | DRG: 216 | Disposition: A | Discharge status: Skilled Nursing Facility | Payer: Medicare HMO | Attending: Cardiothoracic Surgery | Admitting: Cardiothoracic Surgery

## 2020-09-13 ENCOUNTER — Encounter (HOSPITAL_BASED_OUTPATIENT_CLINIC_OR_DEPARTMENT_OTHER): Payer: Self-pay | Admitting: Emergency Medicine

## 2020-09-13 ENCOUNTER — Encounter: Payer: Medicare HMO | Admitting: Internal Medicine

## 2020-09-13 ENCOUNTER — Emergency Department (HOSPITAL_COMMUNITY): Payer: Medicare HMO

## 2020-09-13 DIAGNOSIS — R42 Dizziness and giddiness: Secondary | ICD-10-CM | POA: Diagnosis not present

## 2020-09-13 DIAGNOSIS — Z452 Encounter for adjustment and management of vascular access device: Secondary | ICD-10-CM | POA: Diagnosis not present

## 2020-09-13 DIAGNOSIS — I2721 Secondary pulmonary arterial hypertension: Secondary | ICD-10-CM | POA: Diagnosis present

## 2020-09-13 DIAGNOSIS — J439 Emphysema, unspecified: Secondary | ICD-10-CM | POA: Diagnosis present

## 2020-09-13 DIAGNOSIS — I7101 Dissection of thoracic aorta: Principal | ICD-10-CM | POA: Diagnosis present

## 2020-09-13 DIAGNOSIS — I7100 Dissection of unspecified site of aorta: Secondary | ICD-10-CM

## 2020-09-13 DIAGNOSIS — Z20822 Contact with and (suspected) exposure to covid-19: Secondary | ICD-10-CM | POA: Diagnosis not present

## 2020-09-13 DIAGNOSIS — I361 Nonrheumatic tricuspid (valve) insufficiency: Secondary | ICD-10-CM | POA: Diagnosis not present

## 2020-09-13 DIAGNOSIS — I071 Rheumatic tricuspid insufficiency: Secondary | ICD-10-CM | POA: Diagnosis present

## 2020-09-13 DIAGNOSIS — D6959 Other secondary thrombocytopenia: Secondary | ICD-10-CM | POA: Diagnosis present

## 2020-09-13 DIAGNOSIS — I71019 Dissection of thoracic aorta, unspecified: Secondary | ICD-10-CM

## 2020-09-13 DIAGNOSIS — N184 Chronic kidney disease, stage 4 (severe): Secondary | ICD-10-CM | POA: Diagnosis not present

## 2020-09-13 DIAGNOSIS — Z4682 Encounter for fitting and adjustment of non-vascular catheter: Secondary | ICD-10-CM | POA: Diagnosis not present

## 2020-09-13 DIAGNOSIS — N183 Chronic kidney disease, stage 3 unspecified: Secondary | ICD-10-CM | POA: Diagnosis present

## 2020-09-13 DIAGNOSIS — R0902 Hypoxemia: Secondary | ICD-10-CM | POA: Diagnosis not present

## 2020-09-13 DIAGNOSIS — I50810 Right heart failure, unspecified: Secondary | ICD-10-CM | POA: Diagnosis not present

## 2020-09-13 DIAGNOSIS — I10 Essential (primary) hypertension: Secondary | ICD-10-CM

## 2020-09-13 DIAGNOSIS — J939 Pneumothorax, unspecified: Secondary | ICD-10-CM

## 2020-09-13 DIAGNOSIS — I50811 Acute right heart failure: Secondary | ICD-10-CM | POA: Diagnosis not present

## 2020-09-13 DIAGNOSIS — R638 Other symptoms and signs concerning food and fluid intake: Secondary | ICD-10-CM | POA: Diagnosis not present

## 2020-09-13 DIAGNOSIS — K279 Peptic ulcer, site unspecified, unspecified as acute or chronic, without hemorrhage or perforation: Secondary | ICD-10-CM

## 2020-09-13 DIAGNOSIS — R911 Solitary pulmonary nodule: Secondary | ICD-10-CM | POA: Diagnosis present

## 2020-09-13 DIAGNOSIS — D62 Acute posthemorrhagic anemia: Secondary | ICD-10-CM | POA: Diagnosis not present

## 2020-09-13 DIAGNOSIS — I471 Supraventricular tachycardia: Secondary | ICD-10-CM | POA: Diagnosis present

## 2020-09-13 DIAGNOSIS — I712 Thoracic aortic aneurysm, without rupture: Secondary | ICD-10-CM | POA: Diagnosis not present

## 2020-09-13 DIAGNOSIS — I130 Hypertensive heart and chronic kidney disease with heart failure and stage 1 through stage 4 chronic kidney disease, or unspecified chronic kidney disease: Secondary | ICD-10-CM | POA: Diagnosis present

## 2020-09-13 DIAGNOSIS — I4510 Unspecified right bundle-branch block: Secondary | ICD-10-CM | POA: Diagnosis not present

## 2020-09-13 DIAGNOSIS — Z23 Encounter for immunization: Secondary | ICD-10-CM | POA: Diagnosis not present

## 2020-09-13 DIAGNOSIS — E039 Hypothyroidism, unspecified: Secondary | ICD-10-CM | POA: Diagnosis present

## 2020-09-13 DIAGNOSIS — E7800 Pure hypercholesterolemia, unspecified: Secondary | ICD-10-CM

## 2020-09-13 DIAGNOSIS — E1122 Type 2 diabetes mellitus with diabetic chronic kidney disease: Secondary | ICD-10-CM | POA: Diagnosis present

## 2020-09-13 DIAGNOSIS — Z7982 Long term (current) use of aspirin: Secondary | ICD-10-CM

## 2020-09-13 DIAGNOSIS — J90 Pleural effusion, not elsewhere classified: Secondary | ICD-10-CM | POA: Diagnosis not present

## 2020-09-13 DIAGNOSIS — I719 Aortic aneurysm of unspecified site, without rupture: Secondary | ICD-10-CM | POA: Diagnosis not present

## 2020-09-13 DIAGNOSIS — J810 Acute pulmonary edema: Secondary | ICD-10-CM | POA: Diagnosis not present

## 2020-09-13 DIAGNOSIS — Z7984 Long term (current) use of oral hypoglycemic drugs: Secondary | ICD-10-CM

## 2020-09-13 DIAGNOSIS — J9811 Atelectasis: Secondary | ICD-10-CM | POA: Diagnosis present

## 2020-09-13 DIAGNOSIS — E1165 Type 2 diabetes mellitus with hyperglycemia: Secondary | ICD-10-CM | POA: Diagnosis present

## 2020-09-13 DIAGNOSIS — J9601 Acute respiratory failure with hypoxia: Secondary | ICD-10-CM | POA: Diagnosis not present

## 2020-09-13 DIAGNOSIS — R0602 Shortness of breath: Secondary | ICD-10-CM | POA: Diagnosis not present

## 2020-09-13 DIAGNOSIS — I313 Pericardial effusion (noninflammatory): Secondary | ICD-10-CM | POA: Diagnosis not present

## 2020-09-13 DIAGNOSIS — E785 Hyperlipidemia, unspecified: Secondary | ICD-10-CM | POA: Diagnosis present

## 2020-09-13 DIAGNOSIS — Z9889 Other specified postprocedural states: Secondary | ICD-10-CM

## 2020-09-13 DIAGNOSIS — K5939 Other megacolon: Secondary | ICD-10-CM | POA: Diagnosis not present

## 2020-09-13 DIAGNOSIS — I351 Nonrheumatic aortic (valve) insufficiency: Secondary | ICD-10-CM | POA: Diagnosis present

## 2020-09-13 DIAGNOSIS — N280 Ischemia and infarction of kidney: Secondary | ICD-10-CM | POA: Diagnosis present

## 2020-09-13 DIAGNOSIS — J449 Chronic obstructive pulmonary disease, unspecified: Secondary | ICD-10-CM | POA: Diagnosis not present

## 2020-09-13 DIAGNOSIS — R000 Tachycardia, unspecified: Secondary | ICD-10-CM | POA: Diagnosis not present

## 2020-09-13 DIAGNOSIS — R34 Anuria and oliguria: Secondary | ICD-10-CM | POA: Diagnosis not present

## 2020-09-13 DIAGNOSIS — Z87891 Personal history of nicotine dependence: Secondary | ICD-10-CM

## 2020-09-13 DIAGNOSIS — I517 Cardiomegaly: Secondary | ICD-10-CM | POA: Diagnosis not present

## 2020-09-13 DIAGNOSIS — K6389 Other specified diseases of intestine: Secondary | ICD-10-CM | POA: Diagnosis not present

## 2020-09-13 DIAGNOSIS — R0600 Dyspnea, unspecified: Secondary | ICD-10-CM | POA: Diagnosis not present

## 2020-09-13 DIAGNOSIS — R570 Cardiogenic shock: Secondary | ICD-10-CM | POA: Diagnosis present

## 2020-09-13 DIAGNOSIS — I129 Hypertensive chronic kidney disease with stage 1 through stage 4 chronic kidney disease, or unspecified chronic kidney disease: Secondary | ICD-10-CM | POA: Diagnosis not present

## 2020-09-13 DIAGNOSIS — E119 Type 2 diabetes mellitus without complications: Secondary | ICD-10-CM | POA: Diagnosis not present

## 2020-09-13 DIAGNOSIS — I312 Hemopericardium, not elsewhere classified: Secondary | ICD-10-CM | POA: Diagnosis not present

## 2020-09-13 DIAGNOSIS — I509 Heart failure, unspecified: Secondary | ICD-10-CM

## 2020-09-13 DIAGNOSIS — Z9689 Presence of other specified functional implants: Secondary | ICD-10-CM

## 2020-09-13 DIAGNOSIS — E876 Hypokalemia: Secondary | ICD-10-CM | POA: Diagnosis not present

## 2020-09-13 DIAGNOSIS — I7103 Dissection of thoracoabdominal aorta: Secondary | ICD-10-CM | POA: Diagnosis not present

## 2020-09-13 DIAGNOSIS — M109 Gout, unspecified: Secondary | ICD-10-CM | POA: Diagnosis present

## 2020-09-13 DIAGNOSIS — N179 Acute kidney failure, unspecified: Secondary | ICD-10-CM | POA: Diagnosis present

## 2020-09-13 DIAGNOSIS — J189 Pneumonia, unspecified organism: Secondary | ICD-10-CM | POA: Diagnosis not present

## 2020-09-13 DIAGNOSIS — R079 Chest pain, unspecified: Secondary | ICD-10-CM | POA: Diagnosis not present

## 2020-09-13 DIAGNOSIS — Z79899 Other long term (current) drug therapy: Secondary | ICD-10-CM

## 2020-09-13 DIAGNOSIS — I50813 Acute on chronic right heart failure: Secondary | ICD-10-CM | POA: Diagnosis not present

## 2020-09-13 DIAGNOSIS — Z8673 Personal history of transient ischemic attack (TIA), and cerebral infarction without residual deficits: Secondary | ICD-10-CM

## 2020-09-13 DIAGNOSIS — J811 Chronic pulmonary edema: Secondary | ICD-10-CM | POA: Diagnosis not present

## 2020-09-13 HISTORY — DX: Gastrointestinal hemorrhage, unspecified: K92.2

## 2020-09-13 HISTORY — DX: Essential (primary) hypertension: I10

## 2020-09-13 HISTORY — DX: Personal history of nicotine dependence: Z87.891

## 2020-09-13 HISTORY — DX: Chronic kidney disease, stage 3 unspecified: N18.30

## 2020-09-13 HISTORY — DX: Transient cerebral ischemic attack, unspecified: G45.9

## 2020-09-13 HISTORY — DX: Unspecified right bundle-branch block: I45.10

## 2020-09-13 HISTORY — DX: Aortic aneurysm of unspecified site, without rupture: I71.9

## 2020-09-13 HISTORY — DX: Hyperlipidemia, unspecified: E78.5

## 2020-09-13 HISTORY — PX: THORACIC AORTIC ANEURYSM REPAIR: SHX799

## 2020-09-13 LAB — URINALYSIS, MICROSCOPIC (REFLEX)

## 2020-09-13 LAB — CBC WITH DIFFERENTIAL/PLATELET
Abs Immature Granulocytes: 0.15 10*3/uL — ABNORMAL HIGH (ref 0.00–0.07)
Basophils Absolute: 0 10*3/uL (ref 0.0–0.1)
Basophils Relative: 0 %
Eosinophils Absolute: 0 10*3/uL (ref 0.0–0.5)
Eosinophils Relative: 0 %
HCT: 36.7 % (ref 36.0–46.0)
Hemoglobin: 11.2 g/dL — ABNORMAL LOW (ref 12.0–15.0)
Immature Granulocytes: 1 %
Lymphocytes Relative: 10 %
Lymphs Abs: 1.7 10*3/uL (ref 0.7–4.0)
MCH: 21.2 pg — ABNORMAL LOW (ref 26.0–34.0)
MCHC: 30.5 g/dL (ref 30.0–36.0)
MCV: 69.4 fL — ABNORMAL LOW (ref 80.0–100.0)
Monocytes Absolute: 1.3 10*3/uL — ABNORMAL HIGH (ref 0.1–1.0)
Monocytes Relative: 8 %
Neutro Abs: 13.5 10*3/uL — ABNORMAL HIGH (ref 1.7–7.7)
Neutrophils Relative %: 81 %
Platelets: 109 10*3/uL — ABNORMAL LOW (ref 150–400)
RBC: 5.29 MIL/uL — ABNORMAL HIGH (ref 3.87–5.11)
RDW: 19 % — ABNORMAL HIGH (ref 11.5–15.5)
Smear Review: NORMAL
WBC: 16.8 10*3/uL — ABNORMAL HIGH (ref 4.0–10.5)
nRBC: 0.1 % (ref 0.0–0.2)

## 2020-09-13 LAB — POCT I-STAT, CHEM 8
BUN: 27 mg/dL — ABNORMAL HIGH (ref 8–23)
BUN: 31 mg/dL — ABNORMAL HIGH (ref 8–23)
BUN: 26 mg/dL — ABNORMAL HIGH (ref 8–23)
BUN: 26 mg/dL — ABNORMAL HIGH (ref 8–23)
BUN: 26 mg/dL — ABNORMAL HIGH (ref 8–23)
Calcium, Ion: 1.23 mmol/L (ref 1.15–1.40)
Calcium, Ion: 1.2 mmol/L (ref 1.15–1.40)
Calcium, Ion: 1.06 mmol/L — ABNORMAL LOW (ref 1.15–1.40)
Calcium, Ion: 0.87 mmol/L — CL (ref 1.15–1.40)
Calcium, Ion: 0.91 mmol/L — ABNORMAL LOW (ref 1.15–1.40)
Chloride: 105 mmol/L (ref 98–111)
Chloride: 105 mmol/L (ref 98–111)
Chloride: 103 mmol/L (ref 98–111)
Chloride: 100 mmol/L (ref 98–111)
Chloride: 99 mmol/L (ref 98–111)
Creatinine, Ser: 1.7 mg/dL — ABNORMAL HIGH (ref 0.44–1.00)
Creatinine, Ser: 1.6 mg/dL — ABNORMAL HIGH (ref 0.44–1.00)
Creatinine, Ser: 1.4 mg/dL — ABNORMAL HIGH (ref 0.44–1.00)
Creatinine, Ser: 1.4 mg/dL — ABNORMAL HIGH (ref 0.44–1.00)
Creatinine, Ser: 1.4 mg/dL — ABNORMAL HIGH (ref 0.44–1.00)
Glucose, Bld: 107 mg/dL — ABNORMAL HIGH (ref 70–99)
Glucose, Bld: 109 mg/dL — ABNORMAL HIGH (ref 70–99)
Glucose, Bld: 107 mg/dL — ABNORMAL HIGH (ref 70–99)
Glucose, Bld: 162 mg/dL — ABNORMAL HIGH (ref 70–99)
Glucose, Bld: 172 mg/dL — ABNORMAL HIGH (ref 70–99)
HCT: 30 % — ABNORMAL LOW (ref 36.0–46.0)
HCT: 28 % — ABNORMAL LOW (ref 36.0–46.0)
HCT: 22 % — ABNORMAL LOW (ref 36.0–46.0)
HCT: 17 % — ABNORMAL LOW (ref 36.0–46.0)
HCT: 20 % — ABNORMAL LOW (ref 36.0–46.0)
Hemoglobin: 10.2 g/dL — ABNORMAL LOW (ref 12.0–15.0)
Hemoglobin: 9.5 g/dL — ABNORMAL LOW (ref 12.0–15.0)
Hemoglobin: 7.5 g/dL — ABNORMAL LOW (ref 12.0–15.0)
Hemoglobin: 5.8 g/dL — CL (ref 12.0–15.0)
Hemoglobin: 6.8 g/dL — CL (ref 12.0–15.0)
Potassium: 4.1 mmol/L (ref 3.5–5.1)
Potassium: 4.2 mmol/L (ref 3.5–5.1)
Potassium: 3.8 mmol/L (ref 3.5–5.1)
Potassium: 4.1 mmol/L (ref 3.5–5.1)
Potassium: 4.9 mmol/L (ref 3.5–5.1)
Sodium: 140 mmol/L (ref 135–145)
Sodium: 140 mmol/L (ref 135–145)
Sodium: 140 mmol/L (ref 135–145)
Sodium: 137 mmol/L (ref 135–145)
Sodium: 138 mmol/L (ref 135–145)
TCO2: 21 mmol/L — ABNORMAL LOW (ref 22–32)
TCO2: 23 mmol/L (ref 22–32)
TCO2: 25 mmol/L (ref 22–32)
TCO2: 25 mmol/L (ref 22–32)
TCO2: 27 mmol/L (ref 22–32)

## 2020-09-13 LAB — POCT I-STAT 7, (LYTES, BLD GAS, ICA,H+H)
Acid-Base Excess: 3 mmol/L — ABNORMAL HIGH (ref 0.0–2.0)
Acid-Base Excess: 4 mmol/L — ABNORMAL HIGH (ref 0.0–2.0)
Acid-base deficit: 4 mmol/L — ABNORMAL HIGH (ref 0.0–2.0)
Acid-base deficit: 1 mmol/L (ref 0.0–2.0)
Acid-base deficit: 5 mmol/L — ABNORMAL HIGH (ref 0.0–2.0)
Bicarbonate: 21.4 mmol/L (ref 20.0–28.0)
Bicarbonate: 22.4 mmol/L (ref 20.0–28.0)
Bicarbonate: 23.8 mmol/L (ref 20.0–28.0)
Bicarbonate: 25.6 mmol/L (ref 20.0–28.0)
Bicarbonate: 27.4 mmol/L (ref 20.0–28.0)
Calcium, Ion: 0.81 mmol/L — CL (ref 1.15–1.40)
Calcium, Ion: 0.91 mmol/L — ABNORMAL LOW (ref 1.15–1.40)
Calcium, Ion: 0.96 mmol/L — ABNORMAL LOW (ref 1.15–1.40)
Calcium, Ion: 1.08 mmol/L — ABNORMAL LOW (ref 1.15–1.40)
Calcium, Ion: 1.2 mmol/L (ref 1.15–1.40)
HCT: 21 % — ABNORMAL LOW (ref 36.0–46.0)
HCT: 21 % — ABNORMAL LOW (ref 36.0–46.0)
HCT: 24 % — ABNORMAL LOW (ref 36.0–46.0)
HCT: 24 % — ABNORMAL LOW (ref 36.0–46.0)
HCT: 29 % — ABNORMAL LOW (ref 36.0–46.0)
Hemoglobin: 7.1 g/dL — ABNORMAL LOW (ref 12.0–15.0)
Hemoglobin: 7.1 g/dL — ABNORMAL LOW (ref 12.0–15.0)
Hemoglobin: 8.2 g/dL — ABNORMAL LOW (ref 12.0–15.0)
Hemoglobin: 8.2 g/dL — ABNORMAL LOW (ref 12.0–15.0)
Hemoglobin: 9.9 g/dL — ABNORMAL LOW (ref 12.0–15.0)
O2 Saturation: 100 %
O2 Saturation: 100 %
O2 Saturation: 100 %
O2 Saturation: 100 %
O2 Saturation: 100 %
Potassium: 4.2 mmol/L (ref 3.5–5.1)
Potassium: 4.3 mmol/L (ref 3.5–5.1)
Potassium: 4.5 mmol/L (ref 3.5–5.1)
Potassium: 4.7 mmol/L (ref 3.5–5.1)
Potassium: 5.3 mmol/L — ABNORMAL HIGH (ref 3.5–5.1)
Sodium: 139 mmol/L (ref 135–145)
Sodium: 140 mmol/L (ref 135–145)
Sodium: 140 mmol/L (ref 135–145)
Sodium: 140 mmol/L (ref 135–145)
Sodium: 141 mmol/L (ref 135–145)
TCO2: 23 mmol/L (ref 22–32)
TCO2: 24 mmol/L (ref 22–32)
TCO2: 25 mmol/L (ref 22–32)
TCO2: 27 mmol/L (ref 22–32)
TCO2: 28 mmol/L (ref 22–32)
pCO2 arterial: 30.5 mmHg — ABNORMAL LOW (ref 32.0–48.0)
pCO2 arterial: 34.4 mmHg (ref 32.0–48.0)
pCO2 arterial: 38.7 mmHg (ref 32.0–48.0)
pCO2 arterial: 39.2 mmHg (ref 32.0–48.0)
pCO2 arterial: 52.2 mmHg — ABNORMAL HIGH (ref 32.0–48.0)
pH, Arterial: 7.241 — ABNORMAL LOW (ref 7.350–7.450)
pH, Arterial: 7.351 (ref 7.350–7.450)
pH, Arterial: 7.391 (ref 7.350–7.450)
pH, Arterial: 7.51 — ABNORMAL HIGH (ref 7.350–7.450)
pH, Arterial: 7.533 — ABNORMAL HIGH (ref 7.350–7.450)
pO2, Arterial: 178 mmHg — ABNORMAL HIGH (ref 83.0–108.0)
pO2, Arterial: 337 mmHg — ABNORMAL HIGH (ref 83.0–108.0)
pO2, Arterial: 345 mmHg — ABNORMAL HIGH (ref 83.0–108.0)
pO2, Arterial: 348 mmHg — ABNORMAL HIGH (ref 83.0–108.0)
pO2, Arterial: 431 mmHg — ABNORMAL HIGH (ref 83.0–108.0)

## 2020-09-13 LAB — POCT I-STAT EG7
Acid-base deficit: 2 mmol/L (ref 0.0–2.0)
Bicarbonate: 23.1 mmol/L (ref 20.0–28.0)
Calcium, Ion: 1.02 mmol/L — ABNORMAL LOW (ref 1.15–1.40)
HCT: 24 % — ABNORMAL LOW (ref 36.0–46.0)
Hemoglobin: 8.2 g/dL — ABNORMAL LOW (ref 12.0–15.0)
O2 Saturation: 78 %
Potassium: 4.2 mmol/L (ref 3.5–5.1)
Sodium: 141 mmol/L (ref 135–145)
TCO2: 24 mmol/L (ref 22–32)
pCO2, Ven: 39.3 mmHg — ABNORMAL LOW (ref 44.0–60.0)
pH, Ven: 7.378 (ref 7.250–7.430)
pO2, Ven: 43 mmHg (ref 32.0–45.0)

## 2020-09-13 LAB — COMPREHENSIVE METABOLIC PANEL
ALT: 20 U/L (ref 0–44)
AST: 44 U/L — ABNORMAL HIGH (ref 15–41)
Albumin: 3.8 g/dL (ref 3.5–5.0)
Alkaline Phosphatase: 45 U/L (ref 38–126)
Anion gap: 11 (ref 5–15)
BUN: 31 mg/dL — ABNORMAL HIGH (ref 8–23)
CO2: 26 mmol/L (ref 22–32)
Calcium: 10 mg/dL (ref 8.9–10.3)
Chloride: 100 mmol/L (ref 98–111)
Creatinine, Ser: 1.97 mg/dL — ABNORMAL HIGH (ref 0.44–1.00)
GFR calc Af Amer: 27 mL/min — ABNORMAL LOW (ref >60)
GFR calc non Af Amer: 23 mL/min — ABNORMAL LOW (ref >60)
Glucose, Bld: 157 mg/dL — ABNORMAL HIGH (ref 70–99)
Potassium: 3.8 mmol/L (ref 3.5–5.1)
Sodium: 137 mmol/L (ref 135–145)
Total Bilirubin: 2.2 mg/dL — ABNORMAL HIGH (ref 0.3–1.2)
Total Protein: 8.5 g/dL — ABNORMAL HIGH (ref 6.5–8.1)

## 2020-09-13 LAB — RESPIRATORY PANEL BY RT PCR (FLU A&B, COVID)
Influenza A by PCR: NEGATIVE
Influenza B by PCR: NEGATIVE
SARS Coronavirus 2 by RT PCR: NEGATIVE

## 2020-09-13 LAB — D-DIMER, QUANTITATIVE: D-Dimer, Quant: >20 ug/mL-FEU — ABNORMAL HIGH (ref 0.00–0.50)

## 2020-09-13 LAB — URINALYSIS, ROUTINE W REFLEX MICROSCOPIC
Bilirubin Urine: NEGATIVE
Glucose, UA: NEGATIVE mg/dL
Ketones, ur: NEGATIVE mg/dL
Leukocytes,Ua: NEGATIVE
Nitrite: NEGATIVE
Protein, ur: 100 mg/dL — AB
Specific Gravity, Urine: 1.025 (ref 1.005–1.030)
pH: 5.5 (ref 5.0–8.0)

## 2020-09-13 LAB — LACTIC ACID, PLASMA
Lactic Acid, Venous: 2.2 mmol/L (ref 0.5–1.9)
Lactic Acid, Venous: 1.2 mmol/L (ref 0.5–1.9)

## 2020-09-13 LAB — BRAIN NATRIURETIC PEPTIDE: B Natriuretic Peptide: 388.1 pg/mL — ABNORMAL HIGH (ref 0.0–100.0)

## 2020-09-13 LAB — PREPARE RBC (CROSSMATCH)

## 2020-09-13 LAB — TROPONIN I (HIGH SENSITIVITY)
Troponin I (High Sensitivity): 69 ng/L — ABNORMAL HIGH (ref <18)
Troponin I (High Sensitivity): 56 ng/L — ABNORMAL HIGH (ref <18)

## 2020-09-13 SURGERY — REPAIR, ANEURYSM, AORTA, THORACIC, ASCENDING
Anesthesia: General | Site: Chest

## 2020-09-13 MED ORDER — TRANEXAMIC ACID 1000 MG/10ML IV SOLN
1.5000 mg/kg/h | INTRAVENOUS | Status: AC
  Administered 2020-09-13: 1.5 mg/kg/h via INTRAVENOUS
  Filled 2020-09-13: qty 25

## 2020-09-13 MED ORDER — SODIUM CHLORIDE 0.9% IV SOLUTION: Freq: Once | INTRAVENOUS | Status: DC

## 2020-09-13 MED ORDER — 0.9 % SODIUM CHLORIDE (POUR BTL) OPTIME
TOPICAL | Status: DC | PRN
  Administered 2020-09-13: 5000 mL

## 2020-09-13 MED ORDER — NITROGLYCERIN IN D5W 200-5 MCG/ML-% IV SOLN
INTRAVENOUS | Status: DC | PRN
  Administered 2020-09-13: 15 ug/min via INTRAVENOUS

## 2020-09-13 MED ORDER — MIDAZOLAM HCL 5 MG/5ML IJ SOLN
INTRAMUSCULAR | Status: DC | PRN
  Administered 2020-09-13 – 2020-09-14 (×5): 2 mg via INTRAVENOUS

## 2020-09-13 MED ORDER — PHENYLEPHRINE HCL-NACL 20-0.9 MG/250ML-% IV SOLN
30.0000 ug/min | INTRAVENOUS | Status: AC
  Administered 2020-09-13: 60 ug/min via INTRAVENOUS
  Filled 2020-09-13: qty 250

## 2020-09-13 MED ORDER — HEMOSTATIC AGENTS (NO CHARGE) OPTIME
TOPICAL | Status: DC | PRN
  Administered 2020-09-13: 1 via TOPICAL
  Administered 2020-09-13: 2 via TOPICAL
  Administered 2020-09-13 – 2020-09-14 (×3): 1 via TOPICAL
  Administered 2020-09-14: 3 via TOPICAL

## 2020-09-13 MED ORDER — INSULIN REGULAR(HUMAN) IN NACL 100-0.9 UT/100ML-% IV SOLN
INTRAVENOUS | Status: DC | PRN
  Administered 2020-09-13: 1.6 [IU]/h via INTRAVENOUS

## 2020-09-13 MED ORDER — INSULIN REGULAR(HUMAN) IN NACL 100-0.9 UT/100ML-% IV SOLN
INTRAVENOUS | Status: DC
  Filled 2020-09-13: qty 100

## 2020-09-13 MED ORDER — FENTANYL CITRATE (PF) 250 MCG/5ML IJ SOLN
INTRAMUSCULAR | Status: AC
  Filled 2020-09-13: qty 20

## 2020-09-13 MED ORDER — PHENYLEPHRINE 40 MCG/ML (10ML) SYRINGE FOR IV PUSH (FOR BLOOD PRESSURE SUPPORT)
PREFILLED_SYRINGE | INTRAVENOUS | Status: DC | PRN
  Administered 2020-09-13 (×2): 80 ug via INTRAVENOUS

## 2020-09-13 MED ORDER — FENTANYL CITRATE (PF) 250 MCG/5ML IJ SOLN
INTRAMUSCULAR | Status: DC | PRN
Start: 2020-09-13 — End: 2020-09-14
  Administered 2020-09-13: 50 ug via INTRAVENOUS
  Administered 2020-09-13: 250 ug via INTRAVENOUS
  Administered 2020-09-13: 450 ug via INTRAVENOUS
  Administered 2020-09-14 (×2): 100 ug via INTRAVENOUS
  Administered 2020-09-14: 50 ug via INTRAVENOUS

## 2020-09-13 MED ORDER — PROPOFOL 10 MG/ML IV BOLUS
INTRAVENOUS | Status: DC | PRN
  Administered 2020-09-13: 100 mg via INTRAVENOUS

## 2020-09-13 MED ORDER — DEXMEDETOMIDINE HCL IN NACL 400 MCG/100ML IV SOLN
0.1000 ug/kg/h | INTRAVENOUS | Status: AC
  Administered 2020-09-13: .5 ug/kg/h via INTRAVENOUS
  Filled 2020-09-13 (×2): qty 100

## 2020-09-13 MED ORDER — MILRINONE LACTATE IN DEXTROSE 20-5 MG/100ML-% IV SOLN
0.3000 ug/kg/min | INTRAVENOUS | Status: AC
  Administered 2020-09-14: .25 ug/kg/min via INTRAVENOUS
  Filled 2020-09-13: qty 100

## 2020-09-13 MED ORDER — SODIUM CHLORIDE 0.9 % IV SOLN
750.0000 mg | INTRAVENOUS | Status: AC
  Administered 2020-09-14: 750 mg via INTRAVENOUS
  Filled 2020-09-13: qty 750

## 2020-09-13 MED ORDER — HEPARIN SODIUM (PORCINE) 1000 UNIT/ML IJ SOLN
INTRAMUSCULAR | Status: DC | PRN
  Administered 2020-09-13: 25000 [IU] via INTRAVENOUS

## 2020-09-13 MED ORDER — SODIUM CHLORIDE 0.9 % IV SOLN
INTRAVENOUS | Status: DC
  Filled 2020-09-13: qty 30

## 2020-09-13 MED ORDER — NOREPINEPHRINE 4 MG/250ML-% IV SOLN
0.0000 ug/min | INTRAVENOUS | Status: AC
  Administered 2020-09-14: 4 ug/min via INTRAVENOUS
  Filled 2020-09-13: qty 250

## 2020-09-13 MED ORDER — PROTAMINE SULFATE 10 MG/ML IV SOLN
INTRAVENOUS | Status: AC
  Filled 2020-09-13: qty 20

## 2020-09-13 MED ORDER — MAGNESIUM SULFATE 50 % IJ SOLN
40.0000 meq | INTRAMUSCULAR | Status: DC
  Filled 2020-09-13: qty 9.85

## 2020-09-13 MED ORDER — TRANEXAMIC ACID 1000 MG/10ML IV SOLN
1.5000 mg/kg/h | INTRAVENOUS | Status: DC
  Filled 2020-09-13: qty 25

## 2020-09-13 MED ORDER — IOHEXOL 350 MG/ML SOLN
100.0000 mL | Freq: Once | INTRAVENOUS | Status: AC | PRN
  Administered 2020-09-13: 100 mL via INTRAVENOUS

## 2020-09-13 MED ORDER — ROCURONIUM BROMIDE 10 MG/ML (PF) SYRINGE
PREFILLED_SYRINGE | INTRAVENOUS | Status: DC | PRN
  Administered 2020-09-13: 50 mg via INTRAVENOUS
  Administered 2020-09-13: 100 mg via INTRAVENOUS
  Administered 2020-09-13 – 2020-09-14 (×2): 50 mg via INTRAVENOUS

## 2020-09-13 MED ORDER — TRANEXAMIC ACID (OHS) BOLUS VIA INFUSION
15.0000 mg/kg | INTRAVENOUS | Status: AC
  Administered 2020-09-13: 1108.5 mg via INTRAVENOUS
  Filled 2020-09-13: qty 1109

## 2020-09-13 MED ORDER — NITROGLYCERIN IN D5W 200-5 MCG/ML-% IV SOLN
2.0000 ug/min | INTRAVENOUS | Status: DC
  Filled 2020-09-13: qty 250

## 2020-09-13 MED ORDER — LACTATED RINGERS IV SOLN: INTRAVENOUS | Status: DC | PRN

## 2020-09-13 MED ORDER — VANCOMYCIN HCL 1250 MG/250ML IV SOLN
1250.0000 mg | INTRAVENOUS | Status: AC
  Administered 2020-09-13: 1250 mg via INTRAVENOUS
  Filled 2020-09-13 (×4): qty 250

## 2020-09-13 MED ORDER — SODIUM CHLORIDE (PF) 0.9 % IJ SOLN
OROMUCOSAL | Status: DC | PRN
  Administered 2020-09-13 (×3): 4 mL via TOPICAL

## 2020-09-13 MED ORDER — ETOMIDATE 2 MG/ML IV SOLN
INTRAVENOUS | Status: DC | PRN
  Administered 2020-09-13 (×2): 10 mg via INTRAVENOUS

## 2020-09-13 MED ORDER — EPINEPHRINE HCL 5 MG/250ML IV SOLN IN NS
0.0000 ug/min | INTRAVENOUS | Status: DC
  Filled 2020-09-13: qty 250

## 2020-09-13 MED ORDER — SODIUM CHLORIDE 0.9 % IV SOLN
INTRAVENOUS | Status: DC | PRN
  Administered 2020-09-13: 500 mL

## 2020-09-13 MED ORDER — PLASMA-LYTE 148 IV SOLN
INTRAVENOUS | Status: DC
  Filled 2020-09-13: qty 2.5

## 2020-09-13 MED ORDER — ESMOLOL HCL-SODIUM CHLORIDE 2000 MG/100ML IV SOLN
25.0000 ug/kg/min | INTRAVENOUS | Status: DC
  Administered 2020-09-13: 25 ug/kg/min via INTRAVENOUS
  Filled 2020-09-13: qty 100

## 2020-09-13 MED ORDER — POTASSIUM CHLORIDE 2 MEQ/ML IV SOLN
80.0000 meq | INTRAVENOUS | Status: DC
  Filled 2020-09-13: qty 40

## 2020-09-13 MED ORDER — EPHEDRINE 5 MG/ML INJ
INTRAVENOUS | Status: AC
  Filled 2020-09-13: qty 10

## 2020-09-13 MED ORDER — SUCCINYLCHOLINE CHLORIDE 200 MG/10ML IV SOSY
PREFILLED_SYRINGE | INTRAVENOUS | Status: AC
  Filled 2020-09-13: qty 10

## 2020-09-13 MED ORDER — MIDAZOLAM HCL (PF) 10 MG/2ML IJ SOLN
INTRAMUSCULAR | Status: AC
  Filled 2020-09-13: qty 2

## 2020-09-13 MED ORDER — CLEVIDIPINE BUTYRATE 0.5 MG/ML IV EMUL
0.0000 mg/h | INTRAVENOUS | Status: DC
  Filled 2020-09-13: qty 50

## 2020-09-13 MED ORDER — SODIUM CHLORIDE 0.9 % IV BOLUS
1000.0000 mL | Freq: Once | INTRAVENOUS | Status: AC
  Administered 2020-09-13: 1000 mL via INTRAVENOUS

## 2020-09-13 MED ORDER — TRANEXAMIC ACID (OHS) PUMP PRIME SOLUTION
2.0000 mg/kg | INTRAVENOUS | Status: DC
  Filled 2020-09-13 (×2): qty 1.48

## 2020-09-13 MED ORDER — ALBUTEROL SULFATE HFA 108 (90 BASE) MCG/ACT IN AERS
1.0000 | INHALATION_SPRAY | Freq: Once | RESPIRATORY_TRACT | Status: AC
  Administered 2020-09-13: 2 via RESPIRATORY_TRACT
  Filled 2020-09-13: qty 6.7

## 2020-09-13 MED ORDER — HYDROCORTISONE NA SUCCINATE PF 1000 MG IJ SOLR
INTRAMUSCULAR | Status: DC | PRN
  Administered 2020-09-13: 250 mg via INTRAVENOUS

## 2020-09-13 MED ORDER — SODIUM CHLORIDE 0.9 % IV SOLN
1.5000 g | INTRAVENOUS | Status: AC
  Administered 2020-09-13: 1.5 g via INTRAVENOUS
  Filled 2020-09-13: qty 1.5

## 2020-09-13 SURGICAL SUPPLY — 146 items
ADAPTER CARDIO PERF ANTE/RETRO (ADAPTER) ×2 IMPLANT
ADH SRG 12 PREFL SYR 3 SPRDR (MISCELLANEOUS) ×1
ADPR PRFSN 84XANTGRD RTRGD (ADAPTER) ×1
AGENT HMST KT MTR STRL THRMB (HEMOSTASIS) ×1
BAG DECANTER FOR FLEXI CONT (MISCELLANEOUS) ×2 IMPLANT
BLADE CLIPPER SURG (BLADE) ×2 IMPLANT
BLADE STERNUM SYSTEM 6 (BLADE) ×2 IMPLANT
BLADE SURG 11 STRL SS (BLADE) ×1 IMPLANT
BLADE SURG 15 STRL LF DISP TIS (BLADE) ×1 IMPLANT
BLADE SURG 15 STRL SS (BLADE) ×2
CANISTER SUCT 3000ML PPV (MISCELLANEOUS) ×2 IMPLANT
CANN PRFSN .5XCNCT 15X34-48 (MISCELLANEOUS)
CANNULA PRFSN .5XCNCT 15X34-48 (MISCELLANEOUS) IMPLANT
CANNULA SUMP PERICARDIAL (CANNULA) ×1 IMPLANT
CANNULA VEN 2 STAGE (MISCELLANEOUS)
CATH CPB KIT GERHARDT (MISCELLANEOUS) ×2 IMPLANT
CATH FOLEY 2WAY SLVR 30CC 20FR (CATHETERS) ×1 IMPLANT
CATH HEART VENT LEFT (CATHETERS) IMPLANT
CATH RETROPLEGIA CORONARY 14FR (CATHETERS) ×2 IMPLANT
CATH THORACIC 28FR (CATHETERS) IMPLANT
CATH/SQUID NICHOLS JEHLE COR (CATHETERS) ×1 IMPLANT
CAUTERY SURG HI TEMP FINE TIP (MISCELLANEOUS) ×1 IMPLANT
CLIP VESOCCLUDE MED 6/CT (CLIP) ×2 IMPLANT
CLIP VESOCCLUDE SM WIDE 24/CT (CLIP) IMPLANT
CLIP VESOCCLUDE SM WIDE 6/CT (CLIP) IMPLANT
CNTNR URN SCR LID CUP LEK RST (MISCELLANEOUS) IMPLANT
CONN 1/2X1/2X1/2  BEN (MISCELLANEOUS)
CONN 1/2X1/2X1/2 BEN (MISCELLANEOUS) IMPLANT
CONN 3/8X3/8 GISH STERILE (MISCELLANEOUS) IMPLANT
CONN ST 1/4X3/8  BEN (MISCELLANEOUS) ×4
CONN ST 1/4X3/8 BEN (MISCELLANEOUS) IMPLANT
CONN Y 3/8X3/8X3/8  BEN (MISCELLANEOUS)
CONN Y 3/8X3/8X3/8 BEN (MISCELLANEOUS) IMPLANT
CONT SPEC 4OZ STRL OR WHT (MISCELLANEOUS) ×2
DRAIN CHANNEL 15F RND FF W/TCR (WOUND CARE) IMPLANT
DRAIN CHANNEL 19F RND (DRAIN) IMPLANT
DRAIN CHANNEL 28F RND 3/8 FF (WOUND CARE) ×3 IMPLANT
DRAIN SNY 10X20 3/4 PERF (WOUND CARE) IMPLANT
DRAIN WOUND SNY 15 RND (WOUND CARE) IMPLANT
DRSG AQUACEL AG ADV 3.5X 6 (GAUZE/BANDAGES/DRESSINGS) ×1 IMPLANT
DRSG AQUACEL AG ADV 3.5X14 (GAUZE/BANDAGES/DRESSINGS) ×2 IMPLANT
ELECT BLADE 4.0 EZ CLEAN MEGAD (MISCELLANEOUS) ×2
ELECT CAUTERY BLADE 6.4 (BLADE) ×2 IMPLANT
ELECT REM PT RETURN 9FT ADLT (ELECTROSURGICAL) ×4
ELECTRODE BLDE 4.0 EZ CLN MEGD (MISCELLANEOUS) ×1 IMPLANT
ELECTRODE REM PT RTRN 9FT ADLT (ELECTROSURGICAL) ×2 IMPLANT
EVACUATOR SILICONE 100CC (DRAIN) IMPLANT
FELT TEFLON 1X6 (MISCELLANEOUS) ×3 IMPLANT
FELT TEFLON 6X6 (MISCELLANEOUS) IMPLANT
FILTER SMOKE EVAC ULPA (FILTER) ×2 IMPLANT
GAUZE SPONGE 4X4 12PLY STRL (GAUZE/BANDAGES/DRESSINGS) ×2 IMPLANT
GAUZE SPONGE 4X4 12PLY STRL LF (GAUZE/BANDAGES/DRESSINGS) ×2 IMPLANT
GLOVE BIO SURGEON STRL SZ 6.5 (GLOVE) ×9 IMPLANT
GLOVE BIOGEL PI IND STRL 6.5 (GLOVE) IMPLANT
GLOVE BIOGEL PI IND STRL 7.0 (GLOVE) IMPLANT
GLOVE BIOGEL PI IND STRL 9 (GLOVE) IMPLANT
GLOVE BIOGEL PI INDICATOR 6.5 (GLOVE) ×1
GLOVE BIOGEL PI INDICATOR 7.0 (GLOVE) ×1
GLOVE BIOGEL PI INDICATOR 9 (GLOVE) ×1
GOWN STRL REUS W/ TWL LRG LVL3 (GOWN DISPOSABLE) ×4 IMPLANT
GOWN STRL REUS W/TWL LRG LVL3 (GOWN DISPOSABLE) ×8
GRAFT CV 30X8WVN NDL (Graft) IMPLANT
GRAFT HEMASHIELD 8MM (Graft) ×2 IMPLANT
GRAFT WOVEN D/V 32DX30L (Vascular Products) ×1 IMPLANT
HANDLE STAPLE ENDO GIA SHORT (STAPLE) ×1
HEMOSTAT POWDER SURGIFOAM 1G (HEMOSTASIS) ×3 IMPLANT
HEMOSTAT SURGICEL 2X14 (HEMOSTASIS) ×2 IMPLANT
INSERT FOGARTY SM (MISCELLANEOUS) ×3 IMPLANT
INSERT FOGARTY XLG (MISCELLANEOUS) ×1 IMPLANT
KIT BASIN OR (CUSTOM PROCEDURE TRAY) ×2 IMPLANT
KIT SUCTION CATH 14FR (SUCTIONS) ×3 IMPLANT
KIT TURNOVER KIT B (KITS) ×2 IMPLANT
LEAD PACING MYOCARDI (MISCELLANEOUS) ×2 IMPLANT
LINE VENT (MISCELLANEOUS) ×1 IMPLANT
LOOP VESSEL SUPERMAXI WHITE (MISCELLANEOUS) ×1 IMPLANT
NDL AORTIC AIR ASPIRATING (NEEDLE) IMPLANT
NEEDLE AORTIC AIR ASPIRATING (NEEDLE) IMPLANT
NS IRRIG 1000ML POUR BTL (IV SOLUTION) ×9 IMPLANT
PACK E OPEN HEART (SUTURE) ×1 IMPLANT
PACK OPEN HEART (CUSTOM PROCEDURE TRAY) ×2 IMPLANT
PAD ARMBOARD 7.5X6 YLW CONV (MISCELLANEOUS) ×4 IMPLANT
PENCIL SMOKE EVACUATOR (MISCELLANEOUS) ×2 IMPLANT
POSITIONER HEAD DONUT 9IN (MISCELLANEOUS) IMPLANT
RELOAD TRI 2.0 30 VAS MED SUL (STAPLE) ×1 IMPLANT
SEALANT PATCH FIBRIN 2X4IN (MISCELLANEOUS) ×3 IMPLANT
SEALANT SURG COSEAL 8ML (VASCULAR PRODUCTS) ×1 IMPLANT
SET CARDIOPLEGIA MPS 5001102 (MISCELLANEOUS) ×1 IMPLANT
SLEEVE SUCTION 125 (MISCELLANEOUS) ×2 IMPLANT
SLEEVE SUCTION CATH 165 (SLEEVE) ×1 IMPLANT
SPONGE LAP 18X18 RF (DISPOSABLE) IMPLANT
SPONGE LAP 4X18 RFD (DISPOSABLE) ×7 IMPLANT
STAPLER ENDO GIA 12 SHRT THIN (STAPLE) IMPLANT
STAPLER ENDO GIA 12MM SHORT (STAPLE) ×1 IMPLANT
STAPLER VISISTAT 35W (STAPLE) IMPLANT
STOPCOCK 4 WAY LG BORE MALE ST (IV SETS) IMPLANT
STRIP CLOSURE SKIN 1/2X4 (GAUZE/BANDAGES/DRESSINGS) IMPLANT
SURGIFLO W/THROMBIN 8M KIT (HEMOSTASIS) ×1 IMPLANT
SUT ETHIBOND 2 0 SH (SUTURE) ×8
SUT ETHIBOND 2 0 SH 36X2 (SUTURE) ×4 IMPLANT
SUT MNCRL AB 3-0 PS2 18 (SUTURE) IMPLANT
SUT PROLENE 3 0 RB 1 (SUTURE) ×2 IMPLANT
SUT PROLENE 3 0 SH 1 (SUTURE) ×2 IMPLANT
SUT PROLENE 3 0 SH DA (SUTURE) ×2 IMPLANT
SUT PROLENE 3 0 SH1 36 (SUTURE) ×2 IMPLANT
SUT PROLENE 4 0 RB 1 (SUTURE) ×42
SUT PROLENE 4 0 SH DA (SUTURE) ×1 IMPLANT
SUT PROLENE 4 0 TF (SUTURE) ×4 IMPLANT
SUT PROLENE 4-0 RB1 .5 CRCL 36 (SUTURE) IMPLANT
SUT PROLENE 5 0 C 1 36 (SUTURE) ×6 IMPLANT
SUT PROLENE 6 0 C 1 30 (SUTURE) ×2 IMPLANT
SUT PROLENE 6 0 CC (SUTURE) IMPLANT
SUT PROLENE 7 0 BV 1 (SUTURE) IMPLANT
SUT PROLENE 7 0 BV1 MDA (SUTURE) IMPLANT
SUT SILK 1 TIES 10X30 (SUTURE) ×1 IMPLANT
SUT SILK 2 0 SH CR/8 (SUTURE) ×4 IMPLANT
SUT SILK 2 0 TIES 17X18 (SUTURE)
SUT SILK 2-0 18XBRD TIE BLK (SUTURE) ×1 IMPLANT
SUT SILK 3 0 SH CR/8 (SUTURE) ×2 IMPLANT
SUT SILK 4 0 REEL (SUTURE) ×2 IMPLANT
SUT STEEL 6MS V (SUTURE) ×1 IMPLANT
SUT STEEL STERNAL CCS#1 18IN (SUTURE) IMPLANT
SUT STEEL SZ 6 DBL 3X14 BALL (SUTURE) IMPLANT
SUT TEM PAC WIRE 2 0 SH (SUTURE) ×4 IMPLANT
SUT VIC AB 1 CT1 18XCR BRD 8 (SUTURE) IMPLANT
SUT VIC AB 1 CT1 8-18 (SUTURE)
SUT VIC AB 1 CTX 18 (SUTURE) ×4 IMPLANT
SUT VIC AB 1 CTX 27 (SUTURE) ×4 IMPLANT
SUT VIC AB 2-0 CT1 27 (SUTURE) ×2
SUT VIC AB 2-0 CT1 TAPERPNT 27 (SUTURE) IMPLANT
SUT VIC AB 2-0 CTX 36 (SUTURE) ×2 IMPLANT
SUT VIC AB 3-0 SH 27 (SUTURE)
SUT VIC AB 3-0 SH 27X BRD (SUTURE) IMPLANT
SUT VIC AB 3-0 X1 27 (SUTURE) ×1 IMPLANT
SUT VICRYL 4-0 PS2 18IN ABS (SUTURE) IMPLANT
SYR 10ML KIT SKIN ADHESIVE (MISCELLANEOUS) ×1 IMPLANT
SYR BULB IRRIG 60ML STRL (SYRINGE) ×2 IMPLANT
SYSTEM SAHARA CHEST DRAIN ATS (WOUND CARE) ×2 IMPLANT
TAPE CLOTH SURG 4X10 WHT LF (GAUZE/BANDAGES/DRESSINGS) ×1 IMPLANT
TAPE PAPER 2X10 WHT MICROPORE (GAUZE/BANDAGES/DRESSINGS) ×1 IMPLANT
TOWEL GREEN STERILE (TOWEL DISPOSABLE) ×2 IMPLANT
TOWEL GREEN STERILE FF (TOWEL DISPOSABLE) ×2 IMPLANT
TRAP FLUID SMOKE EVACUATOR (MISCELLANEOUS) ×2 IMPLANT
TRAY CATH LUMEN 1 20CM STRL (SET/KITS/TRAYS/PACK) IMPLANT
TUBE FEEDING 8FR 16IN STR KANG (MISCELLANEOUS) ×2 IMPLANT
VENT LEFT HEART 12002 (CATHETERS) ×2
WATER STERILE IRR 1000ML POUR (IV SOLUTION) ×4 IMPLANT

## 2020-09-13 NOTE — Anesthesia Procedure Notes (Signed)
Arterial Line Insertion Start/End9/30/2021 6:55 PM, 09/13/2020 6:58 PM Performed by: Jearld Pies, CRNA, CRNA  Patient location: OR. Emergency situation Patient sedated Right was placed Catheter size: 20 G Hand hygiene performed  and Seldinger technique used Allen's test indicative of satisfactory collateral circulation Attempts: 1 Procedure performed without using ultrasound guided technique. Following insertion, dressing applied and Biopatch. Post procedure assessment: normal  Patient tolerated the procedure well with no immediate complications.

## 2020-09-13 NOTE — ED Notes (Signed)
EDP PA C wants Esmolol gtt started when Pt. Returns from CT

## 2020-09-13 NOTE — ED Notes (Signed)
[place] here to get pt

## 2020-09-13 NOTE — Anesthesia Procedure Notes (Signed)
Procedure Name: Intubation Date/Time: 09/13/2020 7:04 PM Performed by: Eligha Bridegroom, CRNA Pre-anesthesia Checklist: Patient identified, Emergency Drugs available, Suction available, Patient being monitored and Timeout performed Patient Re-evaluated:Patient Re-evaluated prior to induction Oxygen Delivery Method: Circle system utilized Preoxygenation: Pre-oxygenation with 100% oxygen Induction Type: IV induction Ventilation: Mask ventilation with difficulty Laryngoscope Size: Mac and 4 Grade View: Grade I Tube type: Oral Tube size: 7.5 mm Number of attempts: 1 Airway Equipment and Method: Stylet Placement Confirmation: ETT inserted through vocal cords under direct vision,  breath sounds checked- equal and bilateral and positive ETCO2 Secured at: 21 cm Tube secured with: Tape Dental Injury: Teeth and Oropharynx as per pre-operative assessment

## 2020-09-13 NOTE — Anesthesia Preprocedure Evaluation (Addendum)
Anesthesia Evaluation  Patient identified by MRN, date of birth, ID band Patient awake    Reviewed: Allergy & Precautions, H&P , NPO status , Patient's Chart, lab work & pertinent test results  Airway Mallampati: II  TM Distance: >3 FB Neck ROM: Full    Dental no notable dental hx. (+) Partial Upper, Dental Advisory Given   Pulmonary neg pulmonary ROS, former smoker,    Pulmonary exam normal breath sounds clear to auscultation       Cardiovascular hypertension,  Rhythm:Regular Rate:Normal     Neuro/Psych negative neurological ROS  negative psych ROS   GI/Hepatic Neg liver ROS, PUD,   Endo/Other  diabetes, Type 2, Oral Hypoglycemic Agents  Renal/GU Renal InsufficiencyRenal disease  negative genitourinary   Musculoskeletal   Abdominal   Peds  Hematology  (+) Blood dyscrasia, anemia ,   Anesthesia Other Findings   Reproductive/Obstetrics negative OB ROS                             Anesthesia Physical Anesthesia Plan  ASA: IV and emergent  Anesthesia Plan: General   Post-op Pain Management:    Induction: Intravenous  PONV Risk Score and Plan: 3 and Midazolam and Treatment may vary due to age or medical condition  Airway Management Planned: Oral ETT  Additional Equipment: Arterial line, CVP, PA Cath, TEE and Ultrasound Guidance Line Placement  Intra-op Plan: Delibrate Circulatory arrest per surgeon request  Post-operative Plan: Post-operative intubation/ventilation  Informed Consent: I have reviewed the patients History and Physical, chart, labs and discussed the procedure including the risks, benefits and alternatives for the proposed anesthesia with the patient or authorized representative who has indicated his/her understanding and acceptance.     Dental advisory given  Plan Discussed with: CRNA  Anesthesia Plan Comments:        Anesthesia Quick Evaluation

## 2020-09-13 NOTE — ED Notes (Signed)
brevibloc upped to 50 mc/kg or 11 cc by pharmacy

## 2020-09-13 NOTE — Anesthesia Procedure Notes (Signed)
Central Venous Catheter Insertion Performed by: Roderic Palau, MD, anesthesiologist Start/End9/30/2021 6:45 PM, 09/13/2020 7:00 PM Patient location: Pre-op. Preanesthetic checklist: patient identified, IV checked, site marked, risks and benefits discussed, surgical consent, monitors and equipment checked, pre-op evaluation, timeout performed and anesthesia consent Hand hygiene performed  and maximum sterile barriers used  PA cath was placed.[place]wan type:thermodilution PA Cath depth:50 Procedure performed without using ultrasound guided technique. Attempts: 1 Patient tolerated the procedure well with no immediate complications.

## 2020-09-13 NOTE — H&P (Signed)
[address]       [place] [address]             [phone]        Ahmiya Fortune [place]e [place] Medical Record #[id number] Date of Birth: Jan 30, 1935  Referring: No ref. provider found Primary Care: [name], [name], MD Primary Cardiologist:No primary care provider on file.  Chief Complaint:    Chief Complaint  Patient presents with  . Shortness of Breath  . Dizziness    History of Present Illness:     Patient presents approximately 6:30 PM to [facility] with a CT scan done earlier today with obvious type I aortic dissection.  The patient notes that she came to the [facility] emergency room on Tuesday having chest discomfort and spent 9 hours-the patient was never adequately evaluated nor treated in the emergency.  Ultimately she left because she knew she had a previous appointment with her primary care doctor this morning.  From her primary care doctor visit this morning at 9 AM she was sent back to the emergency room and spent an additional 7 hours waiting for a diagnosis.  2 CT scans were done 1 without contrast  The patient has known renal insufficiency, she is made no urine since she was in the emergency room for the past 7 hours creatinine is 2.0  She has no previous cardiac history, does have a history of left hemispheric TIA manifested by slurred speech while reading a book several years ago-this is not reoccurred.  Current Activity/ Functional Status: Patient is independent with mobility/ambulation, transfers, ADL's, IADL's.   Zubrod Score: At the time of surgery this patient's most appropriate activity status/level should be described as: []     0    Normal activity, no symptoms [x]     1    Restricted in physical strenuous activity but ambulatory, able to do out light work []     2    Ambulatory and capable of self care, unable to do work activities, up and about                 more than 50%  Of the time                            []     3     Only limited self care, in bed greater than 50% of waking hours []     4    Completely disabled, no self care, confined to bed or chair []     5    Moribund  Past Medical History:  Diagnosis Date  . ANEMIA-NOS 06/25/2007  . GOUT 11/16/2009  . PARESTHESIA, HANDS 03/27/2009  . PEPTIC ULCER DISEASE 06/25/2007  . RENAL INSUFFICIENCY 06/25/2007  . TRANSIENT ISCHEMIC ATTACK, HX OF 06/25/2007    History reviewed. No pertinent surgical history.  Social History   Tobacco Use  Smoking Status Former Smoker  . Quit date: 12/16/1999  . Years since quitting: 20.7  Smokeless Tobacco Never Used    Social History   Substance and Sexual Activity  Alcohol Use No     No Known Allergies  Current Facility-Administered Medications  Medication Dose Route Frequency Provider Last Rate Last Admin  . 0.9 % irrigation (POUR BTL)    PRN [name], MD   5,000 mL at 09/13/20 1820  . cefUROXime (ZINACEF) 1.5 g in sodium chloride 0.9 % 100 mL IVPB  1.5 g Intravenous To OR [name], MD      . cefUROXime (ZINACEF) 750 mg in sodium chloride 0.9 % 100 mL IVPB  750 mg Intravenous To OR [name], MD      . dexmedetomidine ([place]) 400 MCG/100ML (4 mcg/mL) infusion  0.1-0.7 mcg/kg/hr Intravenous To OR [name], MD      . EPINEPHrine (ADRENALIN) 4 mg in NS 250 mL (0.016 mg/mL) premix infusion  0-10 mcg/min Intravenous To OR [name], MD      . esmolol (BREVIBLOC) 2000 mg / 100 mL (20 mg/mL) infusion  25-300 mcg/kg/min Intravenous Continuous Providence Lanius A, PA-C 5.54 mL/hr at 09/13/20 1710 25 mcg/kg/min at 09/13/20 1710  . hemostatic agents    PRN [name], MD   1 application at 62/26/33 1837  . heparin 30,000 units/NS 1000 mL solution for CELLSAVER   Other To OR [name], MD      . heparin 6,000 Units in sodium chloride 0.9 % 500 mL irrigation    PRN [name], MD   500 mL at 09/13/20 1821  . heparin sodium (porcine) 2,500 Units, papaverine  30 mg in electrolyte-148 (PLASMALYTE-148) 500 mL irrigation   Irrigation To OR [name], MD      . insulin regular, human (MYXREDLIN) 100 units/ 100 mL infusion   Intravenous To OR [name], MD      . magnesium sulfate (IV Push/IM) injection 40 mEq  40 mEq Other To OR [name], MD      . milrinone (PRIMACOR) 20 MG/100 ML (0.2 mg/mL) infusion  0.3 mcg/kg/min Intravenous To OR [name], MD      . nitroGLYCERIN 50 mg in dextrose 5 % 250 mL (0.2 mg/mL) infusion  2-200 mcg/min Intravenous To OR [name], MD      . norepinephrine (LEVOPHED) 4mg  in 250mL premix infusion  0-40 mcg/min Intravenous To OR [name], MD      . phenylephrine (NEOSYNEPHRINE) 20-0.9 MG/250ML-% infusion  30-200 mcg/min Intravenous To OR [name], MD      . potassium chloride injection 80 mEq  80 mEq Other To OR [name], MD      . Surgifoam 1 Gm with 0.9% sodium chloride (4 ml) topical solution    PRN [name], MD   4 mL at 09/13/20 1837  . tranexamic acid (CYKLOKAPRON) 2,500 mg in sodium chloride 0.9 % 250 mL (10 mg/mL) infusion  1.5 mg/kg/hr Intravenous To OR [name], MD      . tranexamic acid (CYKLOKAPRON) bolus via infusion - over 30 minutes 1,108.5 mg  15 mg/kg Intravenous To OR [name], MD      . tranexamic acid (CYKLOKAPRON) pump prime solution 148 mg  2 mg/kg Intracatheter To OR [name], MD      . vancomycin (VANCOREADY) IVPB 1250 mg/250 mL  1,250 mg Intravenous To OR [name], MD       Facility-Administered Medications Ordered in Other Encounters  Medication Dose Route Frequency Provider Last Rate Last Admin  . lactated ringers infusion   Intravenous Continuous PRN Eligha Bridegroom, CRNA   New Bag at 09/13/20 1828  . lactated ringers infusion   Intravenous Continuous PRN Eligha Bridegroom, CRNA   New Bag at 09/13/20 1831    Medications Prior to Admission  Medication Sig Dispense Refill Last  Dose  . albuterol (PROAIR HFA) 108 (90  Base) MCG/ACT inhaler Inhale 2 puffs into the lungs every 6 (six) hours as needed for wheezing. 18 g 2   . allopurinol (ZYLOPRIM) 300 MG tablet TAKE 1 TABLET EVERY DAY 90 tablet 1   . amLODipine (NORVASC) 5 MG tablet TAKE 1 TABLET EVERY DAY 90 tablet 1   . Ascorbic Acid (VITAMIN C) 100 MG tablet Take 1,000 mg by mouth daily. Takes C Complex     . aspirin 81 MG tablet Take 1 tablet (81 mg total) by mouth daily. 30 tablet    . cholecalciferol (VITAMIN D3) 25 MCG (1000 UT) tablet Take 5,000 Units by mouth daily.      . diclofenac (VOLTAREN) 50 MG EC tablet Take 1 tablet (50 mg total) by mouth 3 (three) times daily as needed. Take 1 tablet TID x 5 days then PRN for pain 21 tablet 0   . famotidine (PEPCID) 40 MG tablet TAKE 1 TABLET EVERY DAY 90 tablet 1   . latanoprost (XALATAN) 0.005 % ophthalmic solution 1 drop daily.     Marland Kitchen levothyroxine (SYNTHROID) 50 MCG tablet TAKE 1/2 TABLET EVERY DAY BEFORE BREAKFAST (NEED MD APPOINTMENT) 45 tablet 0   . metFORMIN (GLUCOPHAGE) 500 MG tablet TAKE 1 TABLET EVERY DAY  WITH  BREAKFAST 90 tablet 4   . Multiple Minerals-Vitamins ([employer] CALCIUM CITRATE+D3 W/MAGNE) TABS Take 1 tablet by mouth in the morning and at bedtime.     . Multiple Vitamin (MULTIVITAMIN WITH MINERALS) TABS Take 1 tablet by mouth daily.     . ondansetron (ZOFRAN ODT) 4 MG disintegrating tablet Take 1 tablet (4 mg total) by mouth every 8 (eight) hours as needed for nausea or vomiting. 20 tablet 0   . simvastatin (ZOCOR) 40 MG tablet TAKE 1 TABLET EVERY DAY 90 tablet 1     No family history on file.   Review of Systems:      Cardiac Review of Systems: Y or  [    ]= no  Chest Pain [ Y   ]  Resting SOB Jazmín.Cullens   ] Exertional SOB  [ Y ]  Orthopnea Jazmín.Cullens  ]   Pedal Edema [ N  ]    Palpitations [ N ] Syncope  Aqua.Slicker  ]   Presyncope [ Y  ]  General Review of Systems: [Y] = yes [  ]=no Constitional: recent weight change [  ]; anorexia [  ]; fatigue [  ]; nausea [  ]; night  sweats [  ]; fever [  ]; or chills [  ]                                                               Dental: Last Dentist visit:   Eye : blurred vision [  ]; diplopia [   ]; vision changes [  ];  Amaurosis fugax[  ]; Resp: cough [  ];  wheezing[  ];  hemoptysis[  ]; shortness of breath[  ]; paroxysmal nocturnal dyspnea[  ]; dyspnea on exertion[  ]; or orthopnea[  ];  GI:  gallstones[  ], vomiting[  ];  dysphagia[  ]; melena[  ];  hematochezia [  ]; heartburn[  ];   Hx of  Colonoscopy[  ]; GU: kidney stones [  ]; hematuria[  ];  dysuria [  ];  nocturia[  ];  history of     obstruction [  ]; urinary frequency [  ]             Skin: rash, swelling[  ];, hair loss[  ];  peripheral edema[  ];  or itching[  ]; Musculosketetal: myalgias[  ];  joint swelling[  ];  joint erythema[  ];  joint pain[  ];  back pain[  ];  Heme/Lymph: bruising[  ];  bleeding[  ];  anemia[  ];  Neuro: TIA[  ];  headaches[  ];  stroke[  ];  vertigo[  ];  seizures[  ];   paresthesias[  ];  difficulty walking[  ];  Psych:depression[  ]; anxiety[  ];  Endocrine: diabetes[  ];  thyroid dysfunction[  ];               Physical Exam: BP (!) 112/91   Pulse 97   Temp 97.6 F (36.4 C) (Oral)   Resp (!) 26   Ht 5' 4.5" (1.638 m)   Wt 73.9 kg   SpO2 98%   BMI 27.55 kg/m    General appearance: alert, cooperative and moderate distress Head: Normocephalic, without obvious abnormality, atraumatic Neck: no adenopathy, no carotid bruit, no JVD, supple, symmetrical, trachea midline and thyroid not enlarged, symmetric, no tenderness/mass/nodules Lymph nodes: Cervical, supraclavicular, and axillary nodes normal. Resp: clear to auscultation bilaterally Cardio: regular rate and rhythm, S1, S2 normal, no murmur, click, rub or gallop GI: soft, non-tender; bowel sounds normal; no masses,  no organomegaly Extremities: extremities normal, atraumatic, no cyanosis or edema and Homans sign is negative, no sign of DVT Neurologic: Grossly  normal Patient is awake alert neurologically intact able to relate her history with good detail She has palpable radial pulses bilaterally and femoral pulses bilaterally  Diagnostic Studies & Laboratory data:     Recent Radiology Findings:   CT Head Wo Contrast  Result Date: 09/13/2020 CLINICAL DATA:  Dizziness since first of the week. EXAM: CT HEAD WITHOUT CONTRAST TECHNIQUE: Contiguous axial images were obtained from the base of the skull through the vertex without intravenous contrast. COMPARISON:  CT head 07/30/2003 report without images. FINDINGS: Brain: Cerebral ventricle sizes are concordant with the degree of cerebral volume loss. Patchy and confluent areas of decreased attenuation are noted throughout the deep and periventricular white matter of the cerebral hemispheres bilaterally, compatible with chronic microvascular ischemic disease. No evidence of large-territorial acute infarction. No parenchymal hemorrhage. No mass lesion. No extra-axial collection. No mass effect or midline shift. No hydrocephalus. Basilar cisterns are patent. Vascular: No hyperdense vessel or unexpected calcification. Skull: Negative for fracture or focal lesion. Sinuses/Orbits: Paranasal sinuses and mastoid air cells are clear. Bilateral lens replacement. Otherwise orbits are unremarkable. Other: None. IMPRESSION: No acute intracranial abnormality. Electronically Signed   By: Iven Finn M.D.   On: 09/13/2020 15:13   CT Chest Wo Contrast  Addendum Date: 09/13/2020   ADDENDUM REPORT: 09/13/2020 17:29 ADDENDUM: Cardiovacsular section should mentioned ascneding thoracic aorta enlarged caliber followed by "Aneurysmal descending thoracic aorta measuring up to 4.1 cm." Electronically Signed   By: Iven Finn M.D.   On: 09/13/2020 17:29   Result Date: 09/13/2020 CLINICAL DATA:  SOB, dizziness since Monday HX renal insufficiency, gout EXAM: CT CHEST WITHOUT CONTRAST TECHNIQUE: Multidetector CT imaging of the chest  was performed following the standard protocol without IV contrast. COMPARISON:  None. FINDINGS: Cardiovascular: Normal heart size. Trace pericardial effusion. Ectatic ascending aorta  measuring up to 4.1 cm. Aneurysmal ascending thoracic aorta measuring up to 4.1 cm. Suggestion of an ascending thoracic aorta dissection that extends through the aortic arch to the descending thoracic aorta and suprarenal abdominal aorta. The dissection and aorta itself are collimated off view more distally as abdomen and pelvis were not obtained. Mild-to-moderate atherosclerotic plaque of the thoracic aorta. At least mild four-vessel coronary artery calcifications. Mediastinum/Nodes: Enlarged mediastinal lymph nodes with as an example a 2 cm precarinal lymph no and a 1.1 cm prevascular lymph node (2:55). No gross hilar adenopathy, noting limited sensitivity for the detection of hilar adenopathy on this noncontrast study. Enlarged 1.2 cm right axillary lymph node. Thyroid gland, trachea, and esophagus demonstrate no significant findings. Lungs/Pleura: At least moderate paraseptal and severe centrilobular emphysematous changes. No definite pulmonary nodule. No pulmonary mass. No focal consolidation. Bleb at the left base. Trace left pleural effusion. No pneumothorax. Upper Abdomen: Colon diverticulosis.  No acute abnormality. Musculoskeletal: Fat stranding within the left axilla that appears to be coursing along left subclavian and axillary vessels of unclear etiology (2:36). No suspicious lytic or blastic osseous lesions. No acute displaced fracture. Multilevel degenerative changes of the spine. IMPRESSION: 1. 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 Markedly limited evaluation on this noncontrast study. 2. Trace pericardial effusion. 3. Trace left pleural effusion. 4. Fat stranding  surrounding the left subclavian and axillary vasculature of unclear etiology. 5. Enlarged right axillary lymph node measuring up to 1.2 cm. Correlate with recent COVID vaccination; if clinically indicated, consider mammographic evaluation. 6. Indeterminate mediastinal lymphadenopathy. 7. Aortic Atherosclerosis (ICD10-I70.0) and Emphysema (ICD10-J43.9). These results were called by telephone at the time of interpretation on 09/13/2020 at 4:40 pm to provider [facility] , who verbally acknowledged these results. Electronically Signed: By: Iven Finn M.D. On: 09/13/2020 16:54   DG Chest Portable 1 View  Result Date: 09/13/2020 CLINICAL DATA:  Shortness of breath and dizziness EXAM: PORTABLE CHEST 1 VIEW COMPARISON:  March 01, 2019 FINDINGS: Lungs are clear. There is cardiomegaly with pulmonary vascularity normal. No adenopathy. No bone lesions. IMPRESSION: Cardiomegaly.  No edema or airspace opacity. Electronically Signed   By: Lowella Grip III M.D.   On: 09/13/2020 10:46   CT Angio Chest/Abd/Pel for Dissection W and/or Wo Contrast  Result Date: 09/13/2020 CLINICAL DATA:  Thoracic dissection noted on noncontrast study. Aortic dissection collimated off view on noncontrast study. Evaluate dissection aneurysm further. EXAM: CT ANGIOGRAPHY CHEST, ABDOMEN AND PELVIS TECHNIQUE: Non-contrast CT of the chest was initially obtained. Multidetector CT imaging through the chest, abdomen and pelvis was performed using the standard protocol during bolus administration of intravenous contrast. Multiplanar reconstructed images and MIPs were obtained and reviewed to evaluate the vascular anatomy. CONTRAST:  152mL OMNIPAQUE IOHEXOL 350 MG/ML SOLN COMPARISON:  None. FINDINGS: VASCULAR Aorta: Mild-to-moderate atherosclerotic plaque of the aorta. Ectatic ascending aorta measuring up to 4.3cm on axial imaging. Aneurysmal descending thoracic aorta measuring up to 4 cm. The abdominal aorta is normal in caliber.  Redemonstration of a type A thoracic aorta extending from the ascending aorta through the aortic arch, descending thoracic aorta, and abdominal aorta with termination in the mid right common iliac artery. No definite fat stranding surrounding the aorta. No extravascular contrast extravasation. The dissection flap is noted to extend (4: 19, 92, 10) into the left subclavian artery and left axillary artery (as well as left axillary  branch) which correlates with the fat stranding noted on the noncontrast study. Dissection flap extension into the left common carotid artery with marked stenosis of the origin of the left common carotid artery and suggestion of surrounding fat stranding/soft tissue density more distally. No definite dissection flap extension into the vertebral arteries. Please see vascular delineation below regarding further extension of the dissection flap. Pulmonary artery: No central pulmonary embolus. Enlarged in caliber left and right main pulmonary arteries. Celiac: The dissection flap extends into the origin of the celiac artery. Patent without evidence of aneurysm, dissection, vasculitis or significant stenosis. SMA: The dissection flap extends into the origin of the superior mesenteric artery. Patent without evidence of aneurysm, dissection, vasculitis or significant stenosis. Renals: Complete non opacification of the right renal artery that originates from the false lumen. The left renal artery originates from the true lumen with no extension of dissection flap into it. The left renal artery is patent without significant stenosis. There is however as these moderate narrowing of its origin due to atherosclerotic plaque. IMA: Possible dissection flap extending into the origin of the inferior mesenteric artery. Patent without evidence of aneurysm, dissection, vasculitis or significant stenosis. Iliacs: Asymmetric opacification of the iliac arteries with the right common iliac artery being supplied by  both the true and false lumen and the left common iliac artery only being supplied by the true lumen the dissection. Normal in caliber. Distally patent. No significant stenosis. Veins: No obvious venous abnormality within the limitations of this arterial phase study. Review of the MIP images confirms the above findings. NON-VASCULAR Cardiovascular: Normal heart size. Redemonstration of a similar appearing at least trace pericardial effusion. At least mild four-vessel coronary artery calcifications. Mediastinum/Nodes: Redemonstration of several enlarged mediastinal lymph nodes with as an example a 1.7 cm precarinal and a 1.1 cm prevascular lymph node. No hilar lymphadenopathy. Redemonstration of several enlarged 1.2 cm right axillary lymph node. Thyroid gland, trachea, and esophagus demonstrate no significant findings. Lungs/Pleura: At least moderate paraseptal and severe centrilobular emphysematous changes. There is a 6 mm left upper lobe pulmonary nodule that appears to be sub solid in density (5:23). No pulmonary mass. No focal consolidation. Bleb at the left base. Trace left pleural effusion. No pneumothorax. Hepatobiliary: No focal liver abnormality is seen. Several calcified gallstones within the gallbladder lumen measuring up to at least 1.6 cm. No gallbladder wall thickening or pericholecystic fluid. No biliary ductal dilatation. Biliary dilatation. Pancreas: Unremarkable. No pancreatic ductal dilatation or surrounding inflammatory changes. Spleen: Normal in size without focal abnormality. Slightly heterogeneous splenic parenchyma likely due to timing of contrast. Adrenals/Urinary Tract: No adrenal nodule bilaterally. Homogeneous unremarkable enhancement of the left kidney. Complete non-opacification of the right kidney. No hydronephrosis. No hydroureter. The urinary bladder is unremarkable. Stomach/Bowel: Stomach is within normal limits. No evidence of bowel wall thickening, distention, or inflammatory  changes. Scattered colonic diverticulosis. The appendix is not definitely identified. Lymphatic: No abdominal, pelvic, inguinal lymphadenopathy. Reproductive: Status post hysterectomy. No adnexal masses. Other: No intraperitoneal free fluid. No intraperitoneal free gas. No organized fluid collection. Musculoskeletal: No abdominal wall hernia or abnormality Diffusely decreased bone density. No suspicious lytic or blastic osseous lesions. No acute displaced fracture. Multilevel degenerative changes of the spine with intervertebral disc space vacuum phenomenon at the L5-S1 level. Review of the MIP images confirms the above findings. IMPRESSION: 1. 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 Associated aneurysmal dilatation of the ascending and descending thoracic aorta. The abdominal aorta  is normal in caliber. 2. Dissection flap extends into the left subclavian artery, left axillary artery, and left axillary branch. Consider left upper extremity angiography for further evaluation more distally. Correlate with physical exam. 3. Dissection flap extends into the left common carotid artery with marked stenosis of its origin and suggestion of surrounding fat stranding/soft tissue density more distally. Consider CT angiography neck for further evaluation. 4. Dissection flap extends into the origin of the celiac, superior mesenteric, and likely inferior mesenteric arteries. Distally the arteries are patent. 5. Complete devascularization of the right kidney due to the right renal artery originating from the excluded lumen. 6. A 6 mm subsolid left upper lobe pulmonary nodule. Follow-up non-contrast CT recommended at 3-6 months to confirm persistence. If unchanged, and solid component remains <6 mm, annual CT is recommended until 5 years of stability has been established. If persistent these nodules should be considered highly suspicious if the solid component of the nodule is 6 mm or  greater in size and enlarging. This recommendation follows the consensus statement: Guidelines for Management of Incidental Pulmonary Nodules Detected on CT Images: From the Fleischner Society 2017; Radiology 2017; 284:228-243. 7. Enlarged right axillary lymph node measuring up to 1.2 cm. Correlate with recent COVID vaccination; if clinically indicated, consider mammographic evaluation. 8. Indeterminate mediastinal lymphadenopathy. 9. Aortic Atherosclerosis (ICD10-I70.0) and Emphysema (ICD10-J43.9). These results were called by telephone at the time of interpretation on 09/13/2020 at 5:42 pm to provider [facility] , who verbally acknowledged these results. Flap extends into the origin and proximal Electronically Signed   By: Iven Finn M.D.   On: 09/13/2020 18:14     I have independently reviewed the above radiologic studies and discussed with the patient   Recent Lab Findings: Lab Results  Component Value Date   WBC 16.8 (H) 09/13/2020   HGB 11.2 (L) 09/13/2020   HCT 36.7 09/13/2020   PLT 109 (L) 09/13/2020   GLUCOSE 157 (H) 09/13/2020   CHOL 132 09/01/2019   TRIG 103.0 09/01/2019   HDL 42.20 09/01/2019   LDLCALC 70 09/01/2019   ALT 20 09/13/2020   AST 44 (H) 09/13/2020   NA 137 09/13/2020   K 3.8 09/13/2020   CL 100 09/13/2020   CREATININE 1.97 (H) 09/13/2020   BUN 31 (H) 09/13/2020   CO2 26 09/13/2020   TSH 2.77 12/01/2019   HGBA1C 5.5 07/20/2020      Assessment / Plan:   #1 extensive acute type I aortic dissection with involvement of the left carotid artery and occlusion of the right renal-with delayed diagnosis and evaluation over the past 25 hours-patient is awake alert and is able to understand the gravity of the current situation repair of this is been discussed with her the risks involved including death infection stroke myocardial infarction bleeding blood transfusion and with her current renal situation possible dialysis.  She is willing to proceed.  Of also  explained this in detail to her daughter who is here with her  #2 acute renal failure-with occlusion of the right renal artery, baseline elevated creatinine and now no urine output for the past 7 hours  Patient is transferred directly from the outside emergency room to the OR holding room for evaluation and is transferred directly to the operating room urgently.  [name] MD      [address]  [place] [address] Office [phone]     09/13/2020 6:39 PM

## 2020-09-13 NOTE — ED Notes (Signed)
Med verified by Lavone Orn D

## 2020-09-13 NOTE — Anesthesia Procedure Notes (Signed)
Central Venous Catheter Insertion Performed by: Roderic Palau, MD, anesthesiologist Start/End9/30/2021 6:45 PM, 09/13/2020 7:00 PM Patient location: Pre-op. Preanesthetic checklist: patient identified, IV checked, site marked, risks and benefits discussed, surgical consent, monitors and equipment checked, pre-op evaluation, timeout performed and anesthesia consent Position: Trendelenburg Lidocaine 1% used for infiltration and patient sedated Hand hygiene performed , maximum sterile barriers used  and Seldinger technique used Catheter size: 9 Fr Total catheter length 10. Central line was placed.MAC introducer Procedure performed using ultrasound guided technique. Ultrasound Notes:anatomy identified, needle tip was noted to be adjacent to the nerve/plexus identified, no ultrasound evidence of intravascular and/or intraneural injection and image(s) printed for medical record Attempts: 1 Following insertion, line sutured, dressing applied and Biopatch. Post procedure assessment: blood return through all ports, free fluid flow and no air  Patient tolerated the procedure well with no immediate complications.

## 2020-09-13 NOTE — ED Provider Notes (Signed)
[facility] DEPARTMENT Provider Note   CSN: [id number] Arrival date & time: 09/13/20  3267     History Chief Complaint  Patient presents with   Shortness of Breath   Dizziness    Danielle Atkins is a 85 y.o. female with PMH/o gout, PUD, HTN, anemia, TIA who presents for evaluation of dizziness, generalized weakness, shortness of breath.  She reports that 2 days ago on 09/11/20, she had an episode of dizziness when going to bed.  She felt like she was off balance.  She also had some burning under her feet.  She called her son who questioned if there was some slurred speech.  No facial droop or weakness was noted.  She went to urgent care and was evaluated and was told to come the emergency department for further evaluation.  She waited in the emergency department but states that after 9 hours, she left without being seen.  She states she went home.  She has not any more episodes of dizziness but states she still has felt weak.  She states she feels mostly weak when she gets up and walks around.  She states she feels off balance when she walks around but does not feel room spinning sensation.  She also reports she has had shortness of breath.  She states shortness of breath has been an ongoing issue for several months.  She has been told she has "touch of asthma" and has been prescribed inhalers but she currently does not have any.  She had started noticing about a month ago, even walking around the park with friends, she would get more short of breath.  She nebulized initially attributed to this weather.  She does feel like over the last 2 weeks or so, the shortness of breath has gotten worse.  No chest pain.  She states she has had chills but no fever.  States she has had decreased appetite and felt like she has not wanted to eat anything.  Denies any abdominal pain or vomiting.  She denies any chest pain.  She has not noted any numbness/weakness of her arms or legs. She  denies any OCP use, recent immobilization, prior history of DVT/PE, recent surgery, leg swelling.  She does report that approximate 2 weeks ago, she drove 4 hours to [place] and then few days ago, drove 4 hours back to [place].  The history is provided by the patient.       Past Medical History:  Diagnosis Date   ANEMIA-NOS 06/25/2007   GOUT 11/16/2009   PARESTHESIA, HANDS 03/27/2009   PEPTIC ULCER DISEASE 06/25/2007   RENAL INSUFFICIENCY 06/25/2007   TRANSIENT ISCHEMIC ATTACK, HX OF 06/25/2007    Patient Active Problem List   Diagnosis Date Noted   Piriformis syndrome, left 09/29/2019   CKD (chronic kidney disease) stage 3, GFR 30-59 ml/min 09/01/2019   Diabetes mellitus with renal complications ([place]) 12/45/8099   GOUT 11/16/2009   PARESTHESIA, HANDS 03/27/2009   Pure hypercholesterolemia 06/25/2007   Microcytic anemia 06/25/2007   Essential hypertension 06/25/2007   PUD (peptic ulcer disease) 06/25/2007   Disorder resulting from impaired renal function 06/25/2007   History of cardiovascular disorder 06/25/2007    History reviewed. No pertinent surgical history.   OB History   No obstetric history on file.     No family history on file.  Social History   Tobacco Use   Smoking status: Former Smoker    Quit date: 12/16/1999    Years  since quitting: 20.7   Smokeless tobacco: Never Used  Vaping Use   Vaping Use: Never used  Substance Use Topics   Alcohol use: No   Drug use: No    Home Medications Prior to Admission medications   Medication Sig Start Date End Date Taking? Authorizing Provider  albuterol (PROAIR HFA) 108 (90 Base) MCG/ACT inhaler Inhale 2 puffs into the lungs every 6 (six) hours as needed for wheezing. 12/28/18   [name], MD  allopurinol (ZYLOPRIM) 300 MG tablet TAKE 1 TABLET EVERY DAY 07/27/20   [name], [name], MD  amLODipine (NORVASC) 5 MG tablet TAKE 1 TABLET EVERY DAY 07/31/20   [name],  [name], MD  Ascorbic Acid (VITAMIN C) 100 MG tablet Take 1,000 mg by mouth daily. Takes C Complex    [provider]  aspirin 81 MG tablet Take 1 tablet (81 mg total) by mouth daily. 06/29/14   [name], MD  cholecalciferol (VITAMIN D3) 25 MCG (1000 UT) tablet Take 5,000 Units by mouth daily.     [provider]  diclofenac (VOLTAREN) 50 MG EC tablet Take 1 tablet (50 mg total) by mouth 3 (three) times daily as needed. Take 1 tablet TID x 5 days then PRN for pain 09/22/19   Enrique Sack, FNP  famotidine (PEPCID) 40 MG tablet TAKE 1 TABLET EVERY DAY 06/21/20   [name], [name], MD  latanoprost (XALATAN) 0.005 % ophthalmic solution 1 drop daily. 06/28/20   [provider]  levothyroxine (SYNTHROID) 50 MCG tablet TAKE 1/2 TABLET EVERY DAY BEFORE BREAKFAST (NEED MD APPOINTMENT) 09/12/20   [name], [name], MD  metFORMIN (GLUCOPHAGE) 500 MG tablet TAKE 1 TABLET EVERY DAY  WITH  BREAKFAST 04/25/20   [name], [name], MD  Multiple Minerals-Vitamins ([employer] CALCIUM CITRATE+D3 W/MAGNE) TABS Take 1 tablet by mouth in the morning and at bedtime.    [provider]  Multiple Vitamin (MULTIVITAMIN WITH MINERALS) TABS Take 1 tablet by mouth daily.    [provider]  ondansetron (ZOFRAN ODT) 4 MG disintegrating tablet Take 1 tablet (4 mg total) by mouth every 8 (eight) hours as needed for nausea or vomiting. 10/27/19   Chase Picket, MD  simvastatin (ZOCOR) 40 MG tablet TAKE 1 TABLET EVERY DAY 06/21/20   [name], [name], MD    Allergies    Patient has no known allergies.  Review of Systems   Review of Systems  Constitutional: Positive for appetite change, chills and fatigue. Negative for fever.  Respiratory: Positive for shortness of breath. Negative for cough.   Cardiovascular: Negative for chest pain and leg swelling.  Gastrointestinal: Negative for abdominal pain, nausea and vomiting.  Genitourinary:  Negative for dysuria and hematuria.  Neurological: Positive for dizziness and light-headedness. Negative for weakness, numbness and headaches.  All other systems reviewed and are negative.   Physical Exam Updated Vital Signs BP (!) 112/91    Pulse 97    Temp 97.6 F (36.4 C) (Oral)    Resp (!) 26    Ht 5' 4.5" (1.638 m)    Wt 73.9 kg    SpO2 98%    BMI 27.55 kg/m   Physical Exam Vitals and nursing note reviewed.  Constitutional:      Appearance: Normal appearance. She is well-developed.  HENT:     Head: Normocephalic and atraumatic.  Eyes:     General: Lids are normal.     Conjunctiva/sclera: Conjunctivae normal.  Pupils: Pupils are equal, round, and reactive to light.     Comments: PERRL. EOMs intact. No nystagmus. No neglect.   Cardiovascular:     Rate and Rhythm: Normal rate and regular rhythm.     Pulses:          Radial pulses are 2+ on the right side and 2+ on the left side.       Dorsalis pedis pulses are 1+ on the right side and 1+ on the left side.     Heart sounds: Normal heart sounds. No murmur heard.  No friction rub. No gallop.   Pulmonary:     Effort: Pulmonary effort is normal. Tachypnea present.     Comments: Tachypnea and increased work of breathing noted.  No obvious wheezing.  No rales noted.  Speaking in medium sentences. Abdominal:     Palpations: Abdomen is soft. Abdomen is not rigid.     Tenderness: There is no abdominal tenderness. There is no guarding.     Comments: Abdomen is soft, non-distended, non-tender. No rigidity, No guarding. No peritoneal signs.  Musculoskeletal:        General: Normal range of motion.     Cervical back: Full passive range of motion without pain.     Comments: BLE are symmetric in appearance with no overlying warmth, erythema, edema   Skin:    General: Skin is warm and dry.     Capillary Refill: Capillary refill takes less than 2 seconds.  Neurological:     Mental Status: She is alert and oriented to person, place, and  time.     Comments: Cranial nerves III-XII intact Follows commands, Moves all extremities  5/5 strength to BUE and BLE  Sensation intact throughout all major nerve distributions No pronator drift. No gait abnormalities - no obvious gait ataxia but patient does feel off when she walks No slurred speech. No facial droop.   Psychiatric:        Speech: Speech normal.     ED Results / Procedures / Treatments   Labs (all labs ordered are listed, but only abnormal results are displayed) Labs Reviewed  COMPREHENSIVE METABOLIC PANEL - Abnormal; Notable for the following components:      Result Value   Glucose, Bld 157 (*)    BUN 31 (*)    Creatinine, Ser 1.97 (*)    Total Protein 8.5 (*)    AST 44 (*)    Total Bilirubin 2.2 (*)    GFR calc non Af Amer 23 (*)    GFR calc Af Amer 27 (*)    All other components within normal limits  CBC WITH DIFFERENTIAL/PLATELET - Abnormal; Notable for the following components:   WBC 16.8 (*)    RBC 5.29 (*)    Hemoglobin 11.2 (*)    MCV 69.4 (*)    MCH 21.2 (*)    RDW 19.0 (*)    Platelets 109 (*)    Neutro Abs 13.5 (*)    Monocytes Absolute 1.3 (*)    Abs Immature Granulocytes 0.15 (*)    All other components within normal limits  LACTIC ACID, PLASMA - Abnormal; Notable for the following components:   Lactic Acid, Venous 2.2 (*)    All other components within normal limits  URINALYSIS, ROUTINE W REFLEX MICROSCOPIC - Abnormal; Notable for the following components:   APPearance CLOUDY (*)    Hgb urine dipstick SMALL (*)    Protein, ur 100 (*)    All other components  within normal limits  BRAIN NATRIURETIC PEPTIDE - Abnormal; Notable for the following components:   B Natriuretic Peptide 388.1 (*)    All other components within normal limits  D-DIMER, QUANTITATIVE (NOT AT [facility]) - Abnormal; Notable for the following components:   D-Dimer, Quant >20.00 (*)    All other components within normal limits  URINALYSIS, MICROSCOPIC (REFLEX) -  Abnormal; Notable for the following components:   Bacteria, UA MANY (*)    All other components within normal limits  TROPONIN I (HIGH SENSITIVITY) - Abnormal; Notable for the following components:   Troponin I (High Sensitivity) 69 (*)    All other components within normal limits  TROPONIN I (HIGH SENSITIVITY) - Abnormal; Notable for the following components:   Troponin I (High Sensitivity) 56 (*)    All other components within normal limits  RESPIRATORY PANEL BY RT PCR (FLU A&B, COVID)  CULTURE, BLOOD (ROUTINE X 2)  CULTURE, BLOOD (ROUTINE X 2)  URINE CULTURE  LACTIC ACID, PLASMA  TYPE AND SCREEN    EKG EKG Interpretation  Date/Time:  Thursday September 13 2020 10:26:08 EDT Ventricular Rate:  107 PR Interval:    QRS Duration: 139 QT Interval:  363 QTC Calculation: 485 R Axis:   -82 Text Interpretation: Sinus tachycardia Right bundle branch block Probable anterior infarct, age indeterminate No significant change since last tracing Confirmed by Deno Etienne [phone]) on 09/13/2020 4:38:38 PM   Radiology CT Head Wo Contrast  Result Date: 09/13/2020 CLINICAL DATA:  Dizziness since first of the week. EXAM: CT HEAD WITHOUT CONTRAST TECHNIQUE: Contiguous axial images were obtained from the base of the skull through the vertex without intravenous contrast. COMPARISON:  CT head 07/30/2003 report without images. FINDINGS: Brain: Cerebral ventricle sizes are concordant with the degree of cerebral volume loss. Patchy and confluent areas of decreased attenuation are noted throughout the deep and periventricular white matter of the cerebral hemispheres bilaterally, compatible with chronic microvascular ischemic disease. No evidence of large-territorial acute infarction. No parenchymal hemorrhage. No mass lesion. No extra-axial collection. No mass effect or midline shift. No hydrocephalus. Basilar cisterns are patent. Vascular: No hyperdense vessel or unexpected calcification. Skull: Negative for  fracture or focal lesion. Sinuses/Orbits: Paranasal sinuses and mastoid air cells are clear. Bilateral lens replacement. Otherwise orbits are unremarkable. Other: None. IMPRESSION: No acute intracranial abnormality. Electronically Signed   By: Iven Finn M.D.   On: 09/13/2020 15:13   CT Chest Wo Contrast  Addendum Date: 09/13/2020   ADDENDUM REPORT: 09/13/2020 17:29 ADDENDUM: Cardiovacsular section should mentioned ascneding thoracic aorta enlarged caliber followed by "Aneurysmal descending thoracic aorta measuring up to 4.1 cm." Electronically Signed   By: Iven Finn M.D.   On: 09/13/2020 17:29   Result Date: 09/13/2020 CLINICAL DATA:  SOB, dizziness since Monday HX renal insufficiency, gout EXAM: CT CHEST WITHOUT CONTRAST TECHNIQUE: Multidetector CT imaging of the chest was performed following the standard protocol without IV contrast. COMPARISON:  None. FINDINGS: Cardiovascular: Normal heart size. Trace pericardial effusion. Ectatic ascending aorta measuring up to 4.1 cm. Aneurysmal ascending thoracic aorta measuring up to 4.1 cm. Suggestion of an ascending thoracic aorta dissection that extends through the aortic arch to the descending thoracic aorta and suprarenal abdominal aorta. The dissection and aorta itself are collimated off view more distally as abdomen and pelvis were not obtained. Mild-to-moderate atherosclerotic plaque of the thoracic aorta. At least mild four-vessel coronary artery calcifications. Mediastinum/Nodes: Enlarged mediastinal lymph nodes with as an example a 2 cm precarinal lymph no and a  1.1 cm prevascular lymph node (2:55). No gross hilar adenopathy, noting limited sensitivity for the detection of hilar adenopathy on this noncontrast study. Enlarged 1.2 cm right axillary lymph node. Thyroid gland, trachea, and esophagus demonstrate no significant findings. Lungs/Pleura: At least moderate paraseptal and severe centrilobular emphysematous changes. No definite pulmonary  nodule. No pulmonary mass. No focal consolidation. Bleb at the left base. Trace left pleural effusion. No pneumothorax. Upper Abdomen: Colon diverticulosis.  No acute abnormality. Musculoskeletal: Fat stranding within the left axilla that appears to be coursing along left subclavian and axillary vessels of unclear etiology (2:36). No suspicious lytic or blastic osseous lesions. No acute displaced fracture. Multilevel degenerative changes of the spine. IMPRESSION: 1. 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 Markedly limited evaluation on this noncontrast study. 2. Trace pericardial effusion. 3. Trace left pleural effusion. 4. Fat stranding surrounding the left subclavian and axillary vasculature of unclear etiology. 5. Enlarged right axillary lymph node measuring up to 1.2 cm. Correlate with recent COVID vaccination; if clinically indicated, consider mammographic evaluation. 6. Indeterminate mediastinal lymphadenopathy. 7. Aortic Atherosclerosis (ICD10-I70.0) and Emphysema (ICD10-J43.9). These results were called by telephone at the time of interpretation on 09/13/2020 at 4:40 pm to provider [facility] , who verbally acknowledged these results. Electronically Signed: By: Iven Finn M.D. On: 09/13/2020 16:54   DG Chest Portable 1 View  Result Date: 09/13/2020 CLINICAL DATA:  Shortness of breath and dizziness EXAM: PORTABLE CHEST 1 VIEW COMPARISON:  March 01, 2019 FINDINGS: Lungs are clear. There is cardiomegaly with pulmonary vascularity normal. No adenopathy. No bone lesions. IMPRESSION: Cardiomegaly.  No edema or airspace opacity. Electronically Signed   By: Lowella Grip III M.D.   On: 09/13/2020 10:46   CT Angio Chest/Abd/Pel for Dissection W and/or Wo Contrast  Result Date: 09/13/2020 CLINICAL DATA:  Thoracic dissection noted on noncontrast study.  Aortic dissection collimated off view on noncontrast study. Evaluate dissection aneurysm further. EXAM: CT ANGIOGRAPHY CHEST, ABDOMEN AND PELVIS TECHNIQUE: Non-contrast CT of the chest was initially obtained. Multidetector CT imaging through the chest, abdomen and pelvis was performed using the standard protocol during bolus administration of intravenous contrast. Multiplanar reconstructed images and MIPs were obtained and reviewed to evaluate the vascular anatomy. CONTRAST:  159mL OMNIPAQUE IOHEXOL 350 MG/ML SOLN COMPARISON:  None. FINDINGS: VASCULAR Aorta: Mild-to-moderate atherosclerotic plaque of the aorta. Ectatic ascending aorta measuring up to 4.3cm on axial imaging. Aneurysmal descending thoracic aorta measuring up to 4 cm. The abdominal aorta is normal in caliber. Redemonstration of a type A thoracic aorta extending from the ascending aorta through the aortic arch, descending thoracic aorta, and abdominal aorta with termination in the mid right common iliac artery. No definite fat stranding surrounding the aorta. No extravascular contrast extravasation. The dissection flap is noted to extend (4: 22, 69, 50) into the left subclavian artery and left axillary artery (as well as left axillary branch) which correlates with the fat stranding noted on the noncontrast study. Dissection flap extension into the left common carotid artery with marked stenosis of the origin of the left common carotid artery and suggestion of surrounding fat stranding/soft tissue density more distally. No definite dissection flap extension into the vertebral arteries. Please see vascular delineation below regarding further extension of the dissection flap. Pulmonary artery: No central pulmonary embolus. Enlarged in caliber left and right main pulmonary arteries. Celiac: The dissection flap extends into  the origin of the celiac artery. Patent without evidence of aneurysm, dissection, vasculitis or significant stenosis. SMA: The  dissection flap extends into the origin of the superior mesenteric artery. Patent without evidence of aneurysm, dissection, vasculitis or significant stenosis. Renals: Complete non opacification of the right renal artery that originates from the false lumen. The left renal artery originates from the true lumen with no extension of dissection flap into it. The left renal artery is patent without significant stenosis. There is however as these moderate narrowing of its origin due to atherosclerotic plaque. IMA: Possible dissection flap extending into the origin of the inferior mesenteric artery. Patent without evidence of aneurysm, dissection, vasculitis or significant stenosis. Iliacs: Asymmetric opacification of the iliac arteries with the right common iliac artery being supplied by both the true and false lumen and the left common iliac artery only being supplied by the true lumen the dissection. Normal in caliber. Distally patent. No significant stenosis. Veins: No obvious venous abnormality within the limitations of this arterial phase study. Review of the MIP images confirms the above findings. NON-VASCULAR Cardiovascular: Normal heart size. Redemonstration of a similar appearing at least trace pericardial effusion. At least mild four-vessel coronary artery calcifications. Mediastinum/Nodes: Redemonstration of several enlarged mediastinal lymph nodes with as an example a 1.7 cm precarinal and a 1.1 cm prevascular lymph node. No hilar lymphadenopathy. Redemonstration of several enlarged 1.2 cm right axillary lymph node. Thyroid gland, trachea, and esophagus demonstrate no significant findings. Lungs/Pleura: At least moderate paraseptal and severe centrilobular emphysematous changes. There is a 6 mm left upper lobe pulmonary nodule that appears to be sub solid in density (5:23). No pulmonary mass. No focal consolidation. Bleb at the left base. Trace left pleural effusion. No pneumothorax. Hepatobiliary: No focal  liver abnormality is seen. Several calcified gallstones within the gallbladder lumen measuring up to at least 1.6 cm. No gallbladder wall thickening or pericholecystic fluid. No biliary ductal dilatation. Biliary dilatation. Pancreas: Unremarkable. No pancreatic ductal dilatation or surrounding inflammatory changes. Spleen: Normal in size without focal abnormality. Slightly heterogeneous splenic parenchyma likely due to timing of contrast. Adrenals/Urinary Tract: No adrenal nodule bilaterally. Homogeneous unremarkable enhancement of the left kidney. Complete non-opacification of the right kidney. No hydronephrosis. No hydroureter. The urinary bladder is unremarkable. Stomach/Bowel: Stomach is within normal limits. No evidence of bowel wall thickening, distention, or inflammatory changes. Scattered colonic diverticulosis. The appendix is not definitely identified. Lymphatic: No abdominal, pelvic, inguinal lymphadenopathy. Reproductive: Status post hysterectomy. No adnexal masses. Other: No intraperitoneal free fluid. No intraperitoneal free gas. No organized fluid collection. Musculoskeletal: No abdominal wall hernia or abnormality Diffusely decreased bone density. No suspicious lytic or blastic osseous lesions. No acute displaced fracture. Multilevel degenerative changes of the spine with intervertebral disc space vacuum phenomenon at the L5-S1 level. Review of the MIP images confirms the above findings. IMPRESSION: 1. 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 Associated aneurysmal dilatation of the ascending and descending thoracic aorta. The abdominal aorta is normal in caliber. 2. Dissection flap extends into the left subclavian artery, left axillary artery, and left axillary branch. Consider left upper extremity angiography for further evaluation more distally. Correlate with physical exam. 3. Dissection flap extends into the left common carotid artery with marked  stenosis of its origin and suggestion of surrounding fat stranding/soft tissue density more distally. Consider CT angiography neck for further evaluation. 4. Dissection flap extends into the origin of the celiac, superior mesenteric, and likely inferior mesenteric arteries.  Distally the arteries are patent. 5. Complete devascularization of the right kidney due to the right renal artery originating from the excluded lumen. 6. A 6 mm subsolid left upper lobe pulmonary nodule. Follow-up non-contrast CT recommended at 3-6 months to confirm persistence. If unchanged, and solid component remains <6 mm, annual CT is recommended until 5 years of stability has been established. If persistent these nodules should be considered highly suspicious if the solid component of the nodule is 6 mm or greater in size and enlarging. This recommendation follows the consensus statement: Guidelines for Management of Incidental Pulmonary Nodules Detected on CT Images: From the Fleischner Society 2017; Radiology 2017; 284:228-243. 7. Enlarged right axillary lymph node measuring up to 1.2 cm. Correlate with recent COVID vaccination; if clinically indicated, consider mammographic evaluation. 8. Indeterminate mediastinal lymphadenopathy. 9. Aortic Atherosclerosis (ICD10-I70.0) and Emphysema (ICD10-J43.9). These results were called by telephone at the time of interpretation on 09/13/2020 at 5:42 pm to provider [facility] , who verbally acknowledged these results. Flap extends into the origin and proximal Electronically Signed   By: Iven Finn M.D.   On: 09/13/2020 18:14    Procedures .Critical Care Performed by: Volanda Napoleon, PA-C Authorized by: Volanda Napoleon, PA-C   Critical care provider statement:    Critical care time (minutes):  60   Critical care was necessary to treat or prevent imminent or life-threatening deterioration of the following conditions:  Circulatory failure (aortic dissection)   Critical care was  time spent personally by me on the following activities:  Discussions with consultants, evaluation of patient's response to treatment, examination of patient, ordering and performing treatments and interventions, ordering and review of laboratory studies, ordering and review of radiographic studies, pulse oximetry, re-evaluation of patient's condition, obtaining history from patient or surrogate and review of old charts   (including critical care time)  Medications Ordered in ED Medications  esmolol (BREVIBLOC) 2000 mg / 100 mL (20 mg/mL) infusion (25 mcg/kg/min  73.9 kg Intravenous New Bag/Given 09/13/20 1710)  dexmedetomidine (PRECEDEX) 400 MCG/100ML (4 mcg/mL) infusion (has no administration in time range)  insulin regular, human (MYXREDLIN) 100 units/ 100 mL infusion (has no administration in time range)  EPINEPHrine (ADRENALIN) 4 mg in NS 250 mL (0.016 mg/mL) premix infusion (has no administration in time range)  milrinone (PRIMACOR) 20 MG/100 ML (0.2 mg/mL) infusion (has no administration in time range)  nitroGLYCERIN 50 mg in dextrose 5 % 250 mL (0.2 mg/mL) infusion (has no administration in time range)  norepinephrine (LEVOPHED) 4mg  in 273mL premix infusion (has no administration in time range)  phenylephrine (NEOSYNEPHRINE) 20-0.9 MG/250ML-% infusion (has no administration in time range)  magnesium sulfate (IV Push/IM) injection 40 mEq (has no administration in time range)  potassium chloride injection 80 mEq (has no administration in time range)  heparin 30,000 units/NS 1000 mL solution for CELLSAVER (has no administration in time range)  heparin sodium (porcine) 2,500 Units, papaverine 30 mg in electrolyte-148 (PLASMALYTE-148) 500 mL irrigation (has no administration in time range)  tranexamic acid (CYKLOKAPRON) pump prime solution 148 mg (has no administration in time range)  tranexamic acid (CYKLOKAPRON) bolus via infusion - over 30 minutes 1,108.5 mg (has no administration in time  range)  tranexamic acid (CYKLOKAPRON) 2,500 mg in sodium chloride 0.9 % 250 mL (10 mg/mL) infusion (has no administration in time range)  vancomycin (VANCOREADY) IVPB 1250 mg/250 mL (has no administration in time range)  cefUROXime (ZINACEF) 1.5 g in sodium chloride 0.9 % 100 mL IVPB (has  no administration in time range)  cefUROXime (ZINACEF) 750 mg in sodium chloride 0.9 % 100 mL IVPB (has no administration in time range)  0.9 % irrigation (POUR BTL) (5,000 mLs Irrigation Given 09/13/20 1820)  heparin 6,000 Units in sodium chloride 0.9 % 500 mL irrigation (500 mLs Irrigation Given 09/13/20 1821)  Surgifoam 1 Gm with 0.9% sodium chloride (4 ml) topical solution (4 mLs Topical Given 09/13/20 1837)  hemostatic agents (1 application Topical Given 09/13/20 1837)  sodium chloride 0.9 % bolus 1,000 mL (0 mLs Intravenous Stopped 09/13/20 1334)  albuterol (VENTOLIN HFA) 108 (90 Base) MCG/ACT inhaler 1-2 puff (2 puffs Inhalation Given 09/13/20 1501)  iohexol (OMNIPAQUE) 350 MG/ML injection 100 mL (100 mLs Intravenous Contrast Given 09/13/20 1708)    ED Course  I have reviewed the triage vital signs and the nursing notes.  Pertinent labs & imaging results that were available during my care of the patient were reviewed by me and considered in my medical decision making (see chart for details).    MDM Rules/Calculators/A&P                          85 year old female who presents for evaluation of generalized weakness, dizziness, shortness of breath.  Had an episode on Tuesday and was seen at urgent care who evaluated her and wanted her to come to the emergency department.  She went to the emergency department but left before being seen.  States she has not any more dizziness but still felt off when she walks, generalized weakness.  Also reports ongoing and worsening shortness of breath.  No chest pain.  On initial arrival, she is afebrile but is hypotensive, tachycardic and tachypneic.  On exam, no obvious  wheezing but does have some tachypnea and increased work of breathing noted.  Legs are symmetric in appearance.  Consider infectious etiology versus electrolyte imbalance.  I do not see any obvious nystagmus that she does not report report of room spinning sensation.  Doubt peripheral vertigo.  Also consider CVA.  Though she does not have any obvious gait ataxia when I walk her.  Question if she is symptomatic from hypotension.  She states she has had some decreased appetite.  Question if this is all volume depletion.  Also consider cardiac etiology.  Additionally, patient is tachycardic and short of breath.  She is not hypoxic but just when I walked her from the bed around the room, she becomes very short of breath and had significantly increased work of breathing.  She does report that she recently drove from [place] to [place] and reports that the trip was about 4 hours both ways.  No other PE risk factors.  We will plan to check labs, lactic, chest x-ray, CT head.  We'll also obtain CTA of chest for evaluation of possible PE PE as etiology of her symptoms.  Doubt infectious etiology but will plan for sepsis work-up given that she is hypotensive, tachycardiac.   BNP is elevated at 388.  CBC shows leukocytosis of 16.8.  Hemoglobin stable.  CMP shows slight elevation in her BUN and creatinine at 31, 1.97.  Her initial troponin is negative at 69.  Patient not having any chest pain.  Suspect this is likely from demand ischemia.  Her initial lactic is elevated at 2.  Plan for fluids.  Chest x-ray shows cardiomegaly.  No evidence of vascular congestion.  No acute infiltrate.  ED P notified me that while patient was  talking to him in the room, she dropped down with O2 sats of 90.  She has not gone below 90 but we have placed her on 2 L with improvement in her oxygenation status.  She is still tachypneic and slightly tachycardic.  Her blood pressure is improved slightly after a liter bolus.  Given unclear  etiology of her symptoms, will hold off on full 30 cc/kg of fluid bolus here in the ED.  Additionally, for concern for PE, do not want to fluid overload her. At this time, given her tachycardia and borderline hypoxia, will plan for CTA of chest.   Radiology inform you that given patient's GFR, she cannot obtain a CTA of the chest.  We will plan for CT head, and CT chest without contrast for evaluation of any infectious etiology that could be contributing to her symptoms.  I received a call from radiology stating that her CT chest without contrast was concerning for aortic dissection.  Patient with no prior history.  I discussed these findings with both patient and her daughter who was at bedside.  We discussed at length regarding the diagnosis of an aortic dissection and the severity of this finding, including but not limited to death.  I discussed with patient and family that radiology recommend obtaining a CTA of chest abdomen pelvis to see the felt full extension of this dissection.  I discussed with both patient and daughter that given her kidney function, the contrast could be damaging to her kidneys and could cause permanent damage.  We discussed the risk vs benefits of obtaining a CTA chest and pelvis with contrast, including but not limited to worsening condition of dissection, death.  We also discussed that this could result in permanent kidney damage.  I had patient verbalized these concerns back to me.  Zenia Resides, RN witness this conversation.  After extensive discussion with both patient and family, they were agreeable to do CTA of chest and pelvis with contrast for further evaluation.  I received a call from radiology stating that patient had a dissection that extended to the common iliac.  There was some concern that there is some devascularization of the kidney as well.  Patient had been able to give a urine sample here in the ED but had only urinated once since being here.  Given  her findings, cardiothoracic surgeon was consulted.  Patient started on esmolol drip.  Dr. Tyrone Nine discussed with Dr. Pia Mau (CT surgery) who recommended that patient be emergently transferred to the ED.   Updated patient and daughter on plan.  I also discussed with family over phone.  They understand the severity of this diagnosis and are agreeable for transfer. Patient will be transferred over to [facility] OR.   Portions of this note were generated with Lobbyist. Dictation errors may occur despite best attempts at proofreading.   Final Clinical Impression(s) / ED Diagnoses Final diagnoses:  Dissection of thoracic aorta [facility])    Rx / DC Orders ED Discharge Orders    None       Desma Mcgregor 09/13/20 1916    Maudie Flakes, MD 09/14/20 [phone]

## 2020-09-13 NOTE — ED Notes (Signed)
To CT

## 2020-09-13 NOTE — ED Triage Notes (Signed)
SOB, dizziness since Monday. Went to [facility] but left before being seen.

## 2020-09-13 NOTE — ED Notes (Signed)
Pt and family in room aware of needed surgery to fix ballooning  Of aorta, report to [facility]

## 2020-09-13 NOTE — Progress Notes (Signed)
Echocardiogram Echocardiogram Transesophageal has been performed.  Danielle Atkins 09/13/2020, 8:16 PM

## 2020-09-13 NOTE — ED Notes (Signed)
brevibloc up to 75 mc/kg

## 2020-09-13 NOTE — ED Notes (Signed)
PT. ON MONITOR

## 2020-09-13 NOTE — ED Notes (Signed)
Lindsey into [facility] to pt and family agagin

## 2020-09-13 NOTE — Anesthesia Procedure Notes (Signed)
Arterial Line Insertion Start/End9/30/2021 6:52 PM, 09/13/2020 6:53 PM Performed by: Jearld Pies, CRNA, CRNA  Patient location: OR. Emergency situation Lidocaine 1% used for infiltration and patient sedated Left was placed Catheter size: 20 G Hand hygiene performed  and Seldinger technique used Allen's test indicative of satisfactory collateral circulation Attempts: 2 Procedure performed using ultrasound guided technique. Ultrasound Notes:anatomy identified, needle tip was noted to be adjacent to the nerve/plexus identified and no ultrasound evidence of intravascular and/or intraneural injection Following insertion, dressing applied and Biopatch. Patient tolerated the procedure well with no immediate complications.

## 2020-09-14 ENCOUNTER — Inpatient Hospital Stay (HOSPITAL_COMMUNITY): Payer: Medicare HMO

## 2020-09-14 ENCOUNTER — Encounter (HOSPITAL_COMMUNITY): Payer: Self-pay | Admitting: Cardiothoracic Surgery

## 2020-09-14 DIAGNOSIS — I7100 Dissection of unspecified site of aorta: Secondary | ICD-10-CM | POA: Diagnosis present

## 2020-09-14 HISTORY — DX: Dissection of unspecified site of aorta: I71.00

## 2020-09-14 LAB — POCT I-STAT 7, (LYTES, BLD GAS, ICA,H+H)
Acid-Base Excess: 0 mmol/L (ref 0.0–2.0)
Acid-Base Excess: 1 mmol/L (ref 0.0–2.0)
Acid-Base Excess: 1 mmol/L (ref 0.0–2.0)
Acid-Base Excess: 2 mmol/L (ref 0.0–2.0)
Acid-Base Excess: 2 mmol/L (ref 0.0–2.0)
Acid-Base Excess: 4 mmol/L — ABNORMAL HIGH (ref 0.0–2.0)
Acid-base deficit: 2 mmol/L (ref 0.0–2.0)
Bicarbonate: 22.3 mmol/L (ref 20.0–28.0)
Bicarbonate: 23 mmol/L (ref 20.0–28.0)
Bicarbonate: 25.4 mmol/L (ref 20.0–28.0)
Bicarbonate: 25.5 mmol/L (ref 20.0–28.0)
Bicarbonate: 25.6 mmol/L (ref 20.0–28.0)
Bicarbonate: 25.7 mmol/L (ref 20.0–28.0)
Bicarbonate: 27.8 mmol/L (ref 20.0–28.0)
Calcium, Ion: 1.08 mmol/L — ABNORMAL LOW (ref 1.15–1.40)
Calcium, Ion: 1.09 mmol/L — ABNORMAL LOW (ref 1.15–1.40)
Calcium, Ion: 1.09 mmol/L — ABNORMAL LOW (ref 1.15–1.40)
Calcium, Ion: 1.11 mmol/L — ABNORMAL LOW (ref 1.15–1.40)
Calcium, Ion: 1.12 mmol/L — ABNORMAL LOW (ref 1.15–1.40)
Calcium, Ion: 1.19 mmol/L (ref 1.15–1.40)
Calcium, Ion: 1.21 mmol/L (ref 1.15–1.40)
HCT: 20 % — ABNORMAL LOW (ref 36.0–46.0)
HCT: 22 % — ABNORMAL LOW (ref 36.0–46.0)
HCT: 25 % — ABNORMAL LOW (ref 36.0–46.0)
HCT: 26 % — ABNORMAL LOW (ref 36.0–46.0)
HCT: 27 % — ABNORMAL LOW (ref 36.0–46.0)
HCT: 29 % — ABNORMAL LOW (ref 36.0–46.0)
HCT: 29 % — ABNORMAL LOW (ref 36.0–46.0)
Hemoglobin: 6.8 g/dL — CL (ref 12.0–15.0)
Hemoglobin: 7.5 g/dL — ABNORMAL LOW (ref 12.0–15.0)
Hemoglobin: 8.5 g/dL — ABNORMAL LOW (ref 12.0–15.0)
Hemoglobin: 8.8 g/dL — ABNORMAL LOW (ref 12.0–15.0)
Hemoglobin: 9.2 g/dL — ABNORMAL LOW (ref 12.0–15.0)
Hemoglobin: 9.9 g/dL — ABNORMAL LOW (ref 12.0–15.0)
Hemoglobin: 9.9 g/dL — ABNORMAL LOW (ref 12.0–15.0)
O2 Saturation: 100 %
O2 Saturation: 88 %
O2 Saturation: 90 %
O2 Saturation: 94 %
O2 Saturation: 95 %
O2 Saturation: 96 %
O2 Saturation: 98 %
Patient temperature: 34.2
Patient temperature: 35.4
Patient temperature: 36.7
Patient temperature: 36.9
Patient temperature: 37
Potassium: 3.7 mmol/L (ref 3.5–5.1)
Potassium: 3.7 mmol/L (ref 3.5–5.1)
Potassium: 4 mmol/L (ref 3.5–5.1)
Potassium: 4.1 mmol/L (ref 3.5–5.1)
Potassium: 4.4 mmol/L (ref 3.5–5.1)
Potassium: 4.6 mmol/L (ref 3.5–5.1)
Potassium: 4.9 mmol/L (ref 3.5–5.1)
Sodium: 140 mmol/L (ref 135–145)
Sodium: 142 mmol/L (ref 135–145)
Sodium: 144 mmol/L (ref 135–145)
Sodium: 144 mmol/L (ref 135–145)
Sodium: 144 mmol/L (ref 135–145)
Sodium: 145 mmol/L (ref 135–145)
Sodium: 145 mmol/L (ref 135–145)
TCO2: 23 mmol/L (ref 22–32)
TCO2: 24 mmol/L (ref 22–32)
TCO2: 27 mmol/L (ref 22–32)
TCO2: 27 mmol/L (ref 22–32)
TCO2: 27 mmol/L (ref 22–32)
TCO2: 27 mmol/L (ref 22–32)
TCO2: 29 mmol/L (ref 22–32)
pCO2 arterial: 29.3 mmHg — ABNORMAL LOW (ref 32.0–48.0)
pCO2 arterial: 31.6 mmHg — ABNORMAL LOW (ref 32.0–48.0)
pCO2 arterial: 35.1 mmHg (ref 32.0–48.0)
pCO2 arterial: 35.4 mmHg (ref 32.0–48.0)
pCO2 arterial: 36.3 mmHg (ref 32.0–48.0)
pCO2 arterial: 38.4 mmHg (ref 32.0–48.0)
pCO2 arterial: 38.9 mmHg (ref 32.0–48.0)
pH, Arterial: 7.411 (ref 7.350–7.450)
pH, Arterial: 7.423 (ref 7.350–7.450)
pH, Arterial: 7.452 — ABNORMAL HIGH (ref 7.350–7.450)
pH, Arterial: 7.463 — ABNORMAL HIGH (ref 7.350–7.450)
pH, Arterial: 7.469 — ABNORMAL HIGH (ref 7.350–7.450)
pH, Arterial: 7.503 — ABNORMAL HIGH (ref 7.350–7.450)
pH, Arterial: 7.506 — ABNORMAL HIGH (ref 7.350–7.450)
pO2, Arterial: 322 mmHg — ABNORMAL HIGH (ref 83.0–108.0)
pO2, Arterial: 52 mmHg — ABNORMAL LOW (ref 83.0–108.0)
pO2, Arterial: 54 mmHg — ABNORMAL LOW (ref 83.0–108.0)
pO2, Arterial: 57 mmHg — ABNORMAL LOW (ref 83.0–108.0)
pO2, Arterial: 69 mmHg — ABNORMAL LOW (ref 83.0–108.0)
pO2, Arterial: 83 mmHg (ref 83.0–108.0)
pO2, Arterial: 90 mmHg (ref 83.0–108.0)

## 2020-09-14 LAB — BASIC METABOLIC PANEL
Anion gap: 12 (ref 5–15)
Anion gap: 11 (ref 5–15)
Anion gap: 9 (ref 5–15)
BUN: 23 mg/dL (ref 8–23)
BUN: 25 mg/dL — ABNORMAL HIGH (ref 8–23)
BUN: 29 mg/dL — ABNORMAL HIGH (ref 8–23)
CO2: 24 mmol/L (ref 22–32)
CO2: 24 mmol/L (ref 22–32)
CO2: 24 mmol/L (ref 22–32)
Calcium: 7.6 mg/dL — ABNORMAL LOW (ref 8.9–10.3)
Calcium: 7.9 mg/dL — ABNORMAL LOW (ref 8.9–10.3)
Calcium: 8 mg/dL — ABNORMAL LOW (ref 8.9–10.3)
Chloride: 107 mmol/L (ref 98–111)
Chloride: 106 mmol/L (ref 98–111)
Chloride: 108 mmol/L (ref 98–111)
Creatinine, Ser: 1.41 mg/dL — ABNORMAL HIGH (ref 0.44–1.00)
Creatinine, Ser: 1.66 mg/dL — ABNORMAL HIGH (ref 0.44–1.00)
Creatinine, Ser: 1.72 mg/dL — ABNORMAL HIGH (ref 0.44–1.00)
GFR calc Af Amer: 40 mL/min — ABNORMAL LOW (ref >60)
GFR calc Af Amer: 33 mL/min — ABNORMAL LOW (ref >60)
GFR calc Af Amer: 31 mL/min — ABNORMAL LOW (ref >60)
GFR calc non Af Amer: 34 mL/min — ABNORMAL LOW (ref >60)
GFR calc non Af Amer: 28 mL/min — ABNORMAL LOW (ref >60)
GFR calc non Af Amer: 27 mL/min — ABNORMAL LOW (ref >60)
Glucose, Bld: 197 mg/dL — ABNORMAL HIGH (ref 70–99)
Glucose, Bld: 213 mg/dL — ABNORMAL HIGH (ref 70–99)
Glucose, Bld: 103 mg/dL — ABNORMAL HIGH (ref 70–99)
Potassium: 4.1 mmol/L (ref 3.5–5.1)
Potassium: 4 mmol/L (ref 3.5–5.1)
Potassium: 3.6 mmol/L (ref 3.5–5.1)
Sodium: 143 mmol/L (ref 135–145)
Sodium: 141 mmol/L (ref 135–145)
Sodium: 141 mmol/L (ref 135–145)

## 2020-09-14 LAB — POCT I-STAT, CHEM 8
BUN: 28 mg/dL — ABNORMAL HIGH (ref 8–23)
BUN: 28 mg/dL — ABNORMAL HIGH (ref 8–23)
Calcium, Ion: 1.16 mmol/L (ref 1.15–1.40)
Calcium, Ion: 1.69 mmol/L (ref 1.15–1.40)
Chloride: 105 mmol/L (ref 98–111)
Chloride: 105 mmol/L (ref 98–111)
Creatinine, Ser: 1.4 mg/dL — ABNORMAL HIGH (ref 0.44–1.00)
Creatinine, Ser: 1.5 mg/dL — ABNORMAL HIGH (ref 0.44–1.00)
Glucose, Bld: 148 mg/dL — ABNORMAL HIGH (ref 70–99)
Glucose, Bld: 152 mg/dL — ABNORMAL HIGH (ref 70–99)
HCT: 21 % — ABNORMAL LOW (ref 36.0–46.0)
HCT: 27 % — ABNORMAL LOW (ref 36.0–46.0)
Hemoglobin: 7.1 g/dL — ABNORMAL LOW (ref 12.0–15.0)
Hemoglobin: 9.2 g/dL — ABNORMAL LOW (ref 12.0–15.0)
Potassium: 4.6 mmol/L (ref 3.5–5.1)
Potassium: 5 mmol/L (ref 3.5–5.1)
Sodium: 138 mmol/L (ref 135–145)
Sodium: 141 mmol/L (ref 135–145)
TCO2: 21 mmol/L — ABNORMAL LOW (ref 22–32)
TCO2: 23 mmol/L (ref 22–32)

## 2020-09-14 LAB — ECHO INTRAOPERATIVE TEE
Height: 64.5 in
Weight: 2608 oz

## 2020-09-14 LAB — URINE CULTURE: Culture: NO GROWTH

## 2020-09-14 LAB — CBC
HCT: 28.5 % — ABNORMAL LOW (ref 36.0–46.0)
HCT: 28.1 % — ABNORMAL LOW (ref 36.0–46.0)
Hemoglobin: 9.3 g/dL — ABNORMAL LOW (ref 12.0–15.0)
Hemoglobin: 9.1 g/dL — ABNORMAL LOW (ref 12.0–15.0)
MCH: 23.4 pg — ABNORMAL LOW (ref 26.0–34.0)
MCH: 23.2 pg — ABNORMAL LOW (ref 26.0–34.0)
MCHC: 32.6 g/dL (ref 30.0–36.0)
MCHC: 32.4 g/dL (ref 30.0–36.0)
MCV: 71.8 fL — ABNORMAL LOW (ref 80.0–100.0)
MCV: 71.5 fL — ABNORMAL LOW (ref 80.0–100.0)
Platelets: 81 10*3/uL — ABNORMAL LOW (ref 150–400)
Platelets: 76 10*3/uL — ABNORMAL LOW (ref 150–400)
RBC: 3.97 MIL/uL (ref 3.87–5.11)
RBC: 3.93 MIL/uL (ref 3.87–5.11)
RDW: 19.1 % — ABNORMAL HIGH (ref 11.5–15.5)
RDW: 18.9 % — ABNORMAL HIGH (ref 11.5–15.5)
WBC: 7.8 10*3/uL (ref 4.0–10.5)
WBC: 8.9 10*3/uL (ref 4.0–10.5)
nRBC: 0 % (ref 0.0–0.2)
nRBC: 0 % (ref 0.0–0.2)

## 2020-09-14 LAB — COOXEMETRY PANEL
Carboxyhemoglobin: 1.2 % (ref 0.5–1.5)
Carboxyhemoglobin: 1.3 % (ref 0.5–1.5)
Carboxyhemoglobin: 1.5 % (ref 0.5–1.5)
Methemoglobin: 1.1 % (ref 0.0–1.5)
Methemoglobin: 1 % (ref 0.0–1.5)
Methemoglobin: 1 % (ref 0.0–1.5)
O2 Saturation: 48.1 %
O2 Saturation: 42.9 %
O2 Saturation: 56.2 %
Total hemoglobin: 9.6 g/dL — ABNORMAL LOW (ref 12.0–16.0)
Total hemoglobin: 7.9 g/dL — ABNORMAL LOW (ref 12.0–16.0)
Total hemoglobin: 9.8 g/dL — ABNORMAL LOW (ref 12.0–16.0)

## 2020-09-14 LAB — FIBRINOGEN: Fibrinogen: 304 mg/dL (ref 210–475)

## 2020-09-14 LAB — GLUCOSE, CAPILLARY
Glucose-Capillary: 100 mg/dL — ABNORMAL HIGH (ref 70–99)
Glucose-Capillary: 103 mg/dL — ABNORMAL HIGH (ref 70–99)
Glucose-Capillary: 106 mg/dL — ABNORMAL HIGH (ref 70–99)
Glucose-Capillary: 106 mg/dL — ABNORMAL HIGH (ref 70–99)
Glucose-Capillary: 109 mg/dL — ABNORMAL HIGH (ref 70–99)
Glucose-Capillary: 121 mg/dL — ABNORMAL HIGH (ref 70–99)
Glucose-Capillary: 123 mg/dL — ABNORMAL HIGH (ref 70–99)
Glucose-Capillary: 171 mg/dL — ABNORMAL HIGH (ref 70–99)
Glucose-Capillary: 191 mg/dL — ABNORMAL HIGH (ref 70–99)
Glucose-Capillary: 202 mg/dL — ABNORMAL HIGH (ref 70–99)
Glucose-Capillary: 203 mg/dL — ABNORMAL HIGH (ref 70–99)
Glucose-Capillary: 205 mg/dL — ABNORMAL HIGH (ref 70–99)
Glucose-Capillary: 206 mg/dL — ABNORMAL HIGH (ref 70–99)
Glucose-Capillary: 207 mg/dL — ABNORMAL HIGH (ref 70–99)
Glucose-Capillary: 209 mg/dL — ABNORMAL HIGH (ref 70–99)
Glucose-Capillary: 228 mg/dL — ABNORMAL HIGH (ref 70–99)
Glucose-Capillary: 87 mg/dL (ref 70–99)

## 2020-09-14 LAB — PROTIME-INR
INR: 1 (ref 0.8–1.2)
Prothrombin Time: 12.3 seconds (ref 11.4–15.2)

## 2020-09-14 LAB — PLATELET COUNT: Platelets: 46 10*3/uL — ABNORMAL LOW (ref 150–400)

## 2020-09-14 LAB — MAGNESIUM
Magnesium: 1.4 mg/dL — ABNORMAL LOW (ref 1.7–2.4)
Magnesium: 2.4 mg/dL (ref 1.7–2.4)

## 2020-09-14 LAB — PREPARE RBC (CROSSMATCH)

## 2020-09-14 LAB — HEMOGLOBIN AND HEMATOCRIT, BLOOD
HCT: 22.9 % — ABNORMAL LOW (ref 36.0–46.0)
Hemoglobin: 7 g/dL — ABNORMAL LOW (ref 12.0–15.0)

## 2020-09-14 LAB — ABO/RH: ABO/RH(D): AB POS

## 2020-09-14 LAB — APTT: aPTT: 36 seconds (ref 24–36)

## 2020-09-14 MED ORDER — LEVALBUTEROL HCL 0.63 MG/3ML IN NEBU
0.6300 mg | INHALATION_SOLUTION | Freq: Three times a day (TID) | RESPIRATORY_TRACT | Status: DC | PRN
  Administered 2020-09-15 – 2020-09-16 (×3): 0.63 mg via RESPIRATORY_TRACT
  Filled 2020-09-14 (×3): qty 3

## 2020-09-14 MED ORDER — ALLOPURINOL 300 MG PO TABS
300.0000 mg | ORAL_TABLET | Freq: Every day | ORAL | Status: DC
  Administered 2020-09-16 – 2020-10-05 (×20): 300 mg via ORAL
  Filled 2020-09-14 (×20): qty 1

## 2020-09-14 MED ORDER — DOPAMINE-DEXTROSE 3.2-5 MG/ML-% IV SOLN: 0.0000 ug/kg/min | INTRAVENOUS | Status: DC

## 2020-09-14 MED ORDER — METOPROLOL TARTRATE 5 MG/5ML IV SOLN
2.5000 mg | INTRAVENOUS | Status: DC | PRN
  Filled 2020-09-14: qty 5

## 2020-09-14 MED ORDER — PANTOPRAZOLE SODIUM 40 MG PO TBEC
40.0000 mg | DELAYED_RELEASE_TABLET | Freq: Every day | ORAL | Status: DC
  Administered 2020-09-16 – 2020-10-05 (×20): 40 mg via ORAL
  Filled 2020-09-14 (×20): qty 1

## 2020-09-14 MED ORDER — ORAL CARE MOUTH RINSE
15.0000 mL | OROMUCOSAL | Status: DC
  Administered 2020-09-14 – 2020-09-15 (×15): 15 mL via OROMUCOSAL

## 2020-09-14 MED ORDER — SODIUM CHLORIDE 0.9% IV SOLUTION: Freq: Once | INTRAVENOUS | Status: DC

## 2020-09-14 MED ORDER — ACETAMINOPHEN 500 MG PO TABS
1000.0000 mg | ORAL_TABLET | Freq: Four times a day (QID) | ORAL | Status: AC
  Administered 2020-09-15 – 2020-09-19 (×10): 1000 mg via ORAL
  Filled 2020-09-14 (×11): qty 2

## 2020-09-14 MED ORDER — SODIUM CHLORIDE 0.9% FLUSH
3.0000 mL | Freq: Two times a day (BID) | INTRAVENOUS | Status: DC
  Administered 2020-09-15 – 2020-10-05 (×29): 3 mL via INTRAVENOUS

## 2020-09-14 MED ORDER — COAGULATION FACTOR VIIA RECOMB 1 MG IV SOLR
90.0000 ug/kg | Freq: Once | INTRAVENOUS | Status: AC
  Administered 2020-09-14: 7000 ug via INTRAVENOUS
  Filled 2020-09-14: qty 5

## 2020-09-14 MED ORDER — DOCUSATE SODIUM 100 MG PO CAPS
200.0000 mg | ORAL_CAPSULE | Freq: Every day | ORAL | Status: DC
  Administered 2020-09-17 – 2020-10-05 (×18): 200 mg via ORAL
  Filled 2020-09-14 (×19): qty 2

## 2020-09-14 MED ORDER — LACTATED RINGERS IV SOLN: INTRAVENOUS | Status: DC

## 2020-09-14 MED ORDER — NOREPINEPHRINE 4 MG/250ML-% IV SOLN: 0.0000 ug/min | INTRAVENOUS | Status: DC

## 2020-09-14 MED ORDER — MIDAZOLAM HCL 2 MG/2ML IJ SOLN
2.0000 mg | INTRAMUSCULAR | Status: DC | PRN
  Administered 2020-09-14 (×2): 1 mg via INTRAVENOUS
  Administered 2020-09-15: 2 mg via INTRAVENOUS
  Filled 2020-09-14 (×3): qty 2

## 2020-09-14 MED ORDER — ASPIRIN EC 325 MG PO TBEC
325.0000 mg | DELAYED_RELEASE_TABLET | Freq: Every day | ORAL | Status: DC
  Administered 2020-09-16 – 2020-10-05 (×20): 325 mg via ORAL
  Filled 2020-09-14 (×22): qty 1

## 2020-09-14 MED ORDER — SODIUM CHLORIDE 0.9% IV SOLUTION: Freq: Once | INTRAVENOUS | Status: AC

## 2020-09-14 MED ORDER — DOPAMINE-DEXTROSE 3.2-5 MG/ML-% IV SOLN
2.5000 ug/kg/min | INTRAVENOUS | Status: DC
  Administered 2020-09-14: 2.5 ug/kg/min via INTRAVENOUS

## 2020-09-14 MED ORDER — DOPAMINE-DEXTROSE 3.2-5 MG/ML-% IV SOLN
2.0000 ug/kg/min | INTRAVENOUS | Status: DC
  Administered 2020-09-16: 2.5 ug/kg/min via INTRAVENOUS
  Filled 2020-09-14: qty 250

## 2020-09-14 MED ORDER — CHLORHEXIDINE GLUCONATE 0.12% ORAL RINSE (MEDLINE KIT)
15.0000 mL | Freq: Two times a day (BID) | OROMUCOSAL | Status: DC
  Administered 2020-09-14 – 2020-10-02 (×24): 15 mL via OROMUCOSAL

## 2020-09-14 MED ORDER — ACETAMINOPHEN 160 MG/5ML PO SOLN
1000.0000 mg | Freq: Four times a day (QID) | ORAL | Status: AC
  Administered 2020-09-17: 1000 mg
  Filled 2020-09-14 (×2): qty 40.6

## 2020-09-14 MED ORDER — OXYCODONE HCL 5 MG PO TABS
5.0000 mg | ORAL_TABLET | ORAL | Status: DC | PRN
  Administered 2020-09-15: 5 mg via ORAL
  Filled 2020-09-14 (×2): qty 1

## 2020-09-14 MED ORDER — PNEUMOCOCCAL VAC POLYVALENT 25 MCG/0.5ML IJ INJ
0.5000 mL | INJECTION | INTRAMUSCULAR | Status: AC
  Administered 2020-09-18: 0.5 mL via INTRAMUSCULAR
  Filled 2020-09-14: qty 0.5

## 2020-09-14 MED ORDER — VANCOMYCIN HCL IN DEXTROSE 1-5 GM/200ML-% IV SOLN
1000.0000 mg | Freq: Once | INTRAVENOUS | Status: AC
  Administered 2020-09-14: 1000 mg via INTRAVENOUS
  Filled 2020-09-14: qty 200

## 2020-09-14 MED ORDER — SODIUM CHLORIDE 0.9% FLUSH: 3.0000 mL | INTRAVENOUS | Status: DC | PRN

## 2020-09-14 MED ORDER — POTASSIUM CHLORIDE 10 MEQ/50ML IV SOLN
10.0000 meq | INTRAVENOUS | Status: AC
  Administered 2020-09-14 (×3): 10 meq via INTRAVENOUS
  Filled 2020-09-14 (×3): qty 50

## 2020-09-14 MED ORDER — ACETAMINOPHEN 160 MG/5ML PO SOLN
650.0000 mg | Freq: Once | ORAL | Status: AC
  Administered 2020-09-14: 650 mg
  Filled 2020-09-14: qty 20.3

## 2020-09-14 MED ORDER — FAMOTIDINE IN NACL 20-0.9 MG/50ML-% IV SOLN
20.0000 mg | Freq: Two times a day (BID) | INTRAVENOUS | Status: AC
  Administered 2020-09-14 (×2): 20 mg via INTRAVENOUS
  Filled 2020-09-14 (×2): qty 50

## 2020-09-14 MED ORDER — ACETAMINOPHEN 650 MG RE SUPP: 650.0000 mg | Freq: Once | RECTAL | Status: AC

## 2020-09-14 MED ORDER — LACTATED RINGERS IV SOLN: 500.0000 mL | Freq: Once | INTRAVENOUS | Status: DC | PRN

## 2020-09-14 MED ORDER — INSULIN REGULAR(HUMAN) IN NACL 100-0.9 UT/100ML-% IV SOLN: INTRAVENOUS | Status: DC

## 2020-09-14 MED ORDER — MAGNESIUM SULFATE 4 GM/100ML IV SOLN
4.0000 g | Freq: Once | INTRAVENOUS | Status: AC
  Administered 2020-09-14: 4 g via INTRAVENOUS
  Filled 2020-09-14: qty 100

## 2020-09-14 MED ORDER — SODIUM CHLORIDE 0.9 % IV SOLN
250.0000 mL | INTRAVENOUS | Status: DC
  Administered 2020-09-14: 250 mL via INTRAVENOUS

## 2020-09-14 MED ORDER — DOPAMINE-DEXTROSE 3.2-5 MG/ML-% IV SOLN
0.0000 ug/kg/min | INTRAVENOUS | Status: AC
  Administered 2020-09-14: 3 ug/kg/min via INTRAVENOUS
  Filled 2020-09-14: qty 250

## 2020-09-14 MED ORDER — MILRINONE LACTATE IN DEXTROSE 20-5 MG/100ML-% IV SOLN: 0.3000 ug/kg/min | INTRAVENOUS | Status: DC

## 2020-09-14 MED ORDER — DEXTROSE 50 % IV SOLN: 0.0000 mL | INTRAVENOUS | Status: DC | PRN

## 2020-09-14 MED ORDER — SODIUM CHLORIDE 0.9 % IV SOLN
1.5000 g | Freq: Two times a day (BID) | INTRAVENOUS | Status: AC
  Administered 2020-09-14 – 2020-09-15 (×4): 1.5 g via INTRAVENOUS
  Filled 2020-09-14 (×4): qty 1.5

## 2020-09-14 MED ORDER — NITROGLYCERIN IN D5W 200-5 MCG/ML-% IV SOLN: 0.0000 ug/min | INTRAVENOUS | Status: DC

## 2020-09-14 MED ORDER — ASPIRIN 81 MG PO CHEW
324.0000 mg | CHEWABLE_TABLET | Freq: Every day | ORAL | Status: DC
  Administered 2020-09-28: 324 mg
  Filled 2020-09-14 (×6): qty 4

## 2020-09-14 MED ORDER — LEVOTHYROXINE SODIUM 25 MCG PO TABS
25.0000 ug | ORAL_TABLET | Freq: Every morning | ORAL | Status: DC
  Administered 2020-09-16 – 2020-10-05 (×18): 25 ug via ORAL
  Filled 2020-09-14 (×18): qty 1

## 2020-09-14 MED ORDER — MILRINONE LACTATE IN DEXTROSE 20-5 MG/100ML-% IV SOLN
0.3000 ug/kg/min | INTRAVENOUS | Status: DC
  Administered 2020-09-14 – 2020-09-15 (×3): 0.3 ug/kg/min via INTRAVENOUS
  Filled 2020-09-14: qty 100

## 2020-09-14 MED ORDER — BISACODYL 10 MG RE SUPP: 10.0000 mg | Freq: Every day | RECTAL | Status: DC

## 2020-09-14 MED ORDER — DEXMEDETOMIDINE HCL IN NACL 400 MCG/100ML IV SOLN
0.0000 ug/kg/h | INTRAVENOUS | Status: DC
  Administered 2020-09-14 (×2): 0.7 ug/kg/h via INTRAVENOUS
  Administered 2020-09-14: 0 ug/kg/h via INTRAVENOUS
  Administered 2020-09-15: 0.7 ug/kg/h via INTRAVENOUS
  Filled 2020-09-14 (×3): qty 100

## 2020-09-14 MED ORDER — CHLORHEXIDINE GLUCONATE 0.12 % MT SOLN
15.0000 mL | OROMUCOSAL | Status: AC
  Administered 2020-09-14: 15 mL via OROMUCOSAL
  Filled 2020-09-14: qty 15

## 2020-09-14 MED ORDER — BISACODYL 5 MG PO TBEC
10.0000 mg | DELAYED_RELEASE_TABLET | Freq: Every day | ORAL | Status: DC
  Administered 2020-09-18 – 2020-10-05 (×16): 10 mg via ORAL
  Filled 2020-09-14 (×17): qty 2

## 2020-09-14 MED ORDER — SODIUM CHLORIDE 0.45 % IV SOLN: INTRAVENOUS | Status: DC | PRN

## 2020-09-14 MED ORDER — PROTAMINE SULFATE 10 MG/ML IV SOLN
INTRAVENOUS | Status: DC | PRN
  Administered 2020-09-14: 250 mg via INTRAVENOUS

## 2020-09-14 MED ORDER — SIMVASTATIN 20 MG PO TABS
40.0000 mg | ORAL_TABLET | Freq: Every day | ORAL | Status: DC
  Administered 2020-09-15 – 2020-10-05 (×18): 40 mg via ORAL
  Filled 2020-09-14 (×19): qty 2

## 2020-09-14 MED ORDER — ONDANSETRON HCL 4 MG/2ML IJ SOLN
4.0000 mg | Freq: Four times a day (QID) | INTRAMUSCULAR | Status: DC | PRN
  Administered 2020-09-14 – 2020-09-19 (×2): 4 mg via INTRAVENOUS
  Filled 2020-09-14 (×2): qty 2

## 2020-09-14 MED ORDER — POTASSIUM CHLORIDE 10 MEQ/50ML IV SOLN: 10.0000 meq | INTRAVENOUS | Status: DC

## 2020-09-14 MED ORDER — CHLORHEXIDINE GLUCONATE CLOTH 2 % EX PADS
6.0000 | MEDICATED_PAD | Freq: Every day | CUTANEOUS | Status: DC
  Administered 2020-09-14 – 2020-10-05 (×16): 6 via TOPICAL

## 2020-09-14 MED ORDER — TRAMADOL HCL 50 MG PO TABS
50.0000 mg | ORAL_TABLET | ORAL | Status: DC | PRN
  Administered 2020-09-15: 50 mg via ORAL
  Filled 2020-09-14: qty 1

## 2020-09-14 MED ORDER — MILRINONE LACTATE IN DEXTROSE 20-5 MG/100ML-% IV SOLN
0.2500 ug/kg/min | INTRAVENOUS | Status: DC
  Filled 2020-09-14: qty 100

## 2020-09-14 MED ORDER — PHENYLEPHRINE HCL-NACL 20-0.9 MG/250ML-% IV SOLN
0.0000 ug/min | INTRAVENOUS | Status: DC
  Administered 2020-09-14: 35 ug/min via INTRAVENOUS
  Filled 2020-09-14 (×2): qty 250

## 2020-09-14 MED ORDER — INFLUENZA VAC A&B SA ADJ QUAD 0.5 ML IM PRSY
0.5000 mL | PREFILLED_SYRINGE | INTRAMUSCULAR | Status: DC
  Filled 2020-09-14: qty 0.5

## 2020-09-14 MED ORDER — ALBUMIN HUMAN 5 % IV SOLN
250.0000 mL | INTRAVENOUS | Status: AC | PRN
  Administered 2020-09-14 (×4): 12.5 g via INTRAVENOUS
  Filled 2020-09-14: qty 250

## 2020-09-14 MED ORDER — SODIUM CHLORIDE 0.9 % IV SOLN: INTRAVENOUS | Status: DC

## 2020-09-14 MED ORDER — MORPHINE SULFATE (PF) 2 MG/ML IV SOLN
1.0000 mg | INTRAVENOUS | Status: DC | PRN
  Administered 2020-09-14 (×3): 2 mg via INTRAVENOUS
  Administered 2020-09-15: 1 mg via INTRAVENOUS
  Administered 2020-09-15 (×2): 3 mg via INTRAVENOUS
  Administered 2020-09-17: 2 mg via INTRAVENOUS
  Filled 2020-09-14: qty 1
  Filled 2020-09-14: qty 2
  Filled 2020-09-14 (×3): qty 1
  Filled 2020-09-14: qty 2
  Filled 2020-09-14: qty 1

## 2020-09-14 MED FILL — Calcium Chloride Inj 10%: INTRAVENOUS | Qty: 10 | Status: AC

## 2020-09-14 MED FILL — Sodium Bicarbonate IV Soln 8.4%: INTRAVENOUS | Qty: 200 | Status: AC

## 2020-09-14 MED FILL — Heparin Sodium (Porcine) Inj 1000 Unit/ML: INTRAMUSCULAR | Qty: 2500 | Status: AC

## 2020-09-14 MED FILL — Electrolyte-R (PH 7.4) Solution: INTRAVENOUS | Qty: 5000 | Status: AC

## 2020-09-14 MED FILL — Magnesium Sulfate Inj 50%: INTRAMUSCULAR | Qty: 20 | Status: AC

## 2020-09-14 MED FILL — Heparin Sodium (Porcine) Inj 1000 Unit/ML: INTRAMUSCULAR | Qty: 30 | Status: AC

## 2020-09-14 MED FILL — Albumin, Human Inj 5%: INTRAVENOUS | Qty: 250 | Status: AC

## 2020-09-14 MED FILL — Mannitol IV Soln 20%: INTRAVENOUS | Qty: 500 | Status: AC

## 2020-09-14 MED FILL — Lidocaine HCl Local Soln Prefilled Syringe 100 MG/5ML (2%): INTRAMUSCULAR | Qty: 5 | Status: AC

## 2020-09-14 MED FILL — Potassium Chloride Inj 2 mEq/ML: INTRAVENOUS | Qty: 40 | Status: AC

## 2020-09-14 MED FILL — Sodium Chloride IV Soln 0.9%: INTRAVENOUS | Qty: 3000 | Status: AC

## 2020-09-14 NOTE — Transfer of Care (Signed)
Immediate Anesthesia Transfer of Care Note  Patient: Teacher, early years/pre  Procedure(s) Performed: THORACIC ASCENDING ANEURYSM REPAIR (AAA) USING HEMASHIELD PLATINUM 32MM x 30CM WOVEN DOUBLE VELOUR GRAFT (N/A Chest)  Patient Location: [name]  Anesthesia Type:General  Level of Consciousness: sedated and Patient remains intubated per anesthesia plan  Airway & Oxygen Therapy: Patient remains intubated per anesthesia plan and Patient placed on Ventilator (see vital sign flow sheet for setting)  Post-op Assessment: Report given to RN and Post -op Vital signs reviewed and stable  Post vital signs: Reviewed and stable  Last Vitals:  Vitals Value Taken Time  BP    Temp    Pulse    Resp    SpO2      Last Pain:  Vitals:   09/13/20 1608  TempSrc:   PainSc: 3          Complications: No complications documented.

## 2020-09-14 NOTE — Progress Notes (Signed)
Patient ID: Danielle Atkins, female   DOB: 07/06/1935, 85 y.o.   MRN: [id number] TCTS DAILY [name] PROGRESS NOTE                   [address]            [place] [address]          [phone]   1 Day Post-Op Procedure(s) (LRB): THORACIC ASCENDING ANEURYSM REPAIR (AAA) USING HEMASHIELD PLATINUM 32MM x 30CM WOVEN DOUBLE VELOUR GRAFT (N/A)  Total Length of Stay:  LOS: 1 day   Subjective: Sedated on vent currently   Objective: Vital signs in last 24 hours: Temp:  [93.4 F (34.1 C)-98.4 F (36.9 C)] 98.2 F (36.8 C) (10/01 1000) Pulse Rate:  [77-110] 89 (10/01 1000) Cardiac Rhythm: Normal sinus rhythm (10/01 0900) Resp:  [15-41] 22 (10/01 1000) BP: (76-122)/(45-91) 91/57 (10/01 0800) SpO2:  [94 %-100 %] 96 % (10/01 1000) Arterial Line BP: (70-126)/(40-64) 126/59 (10/01 1000) FiO2 (%):  [50 %-100 %] 50 % (10/01 0952) Weight:  [73.9 kg] 73.9 kg (09/30 1012)  Filed Weights   09/13/20 1012  Weight: 73.9 kg    Weight change:    Hemodynamic parameters for last 24 hours: PAP: (32-45)/(9-15) 42/10 CO:  [2.8 L/min-3 L/min] 2.8 L/min CI:  [1.5 L/min/m2-1.7 L/min/m2] 1.6 L/min/m2  Intake/Output from previous day: 09/30 0701 - 10/01 0700 In: 8859.3 [I.V.:4196.9; Blood:3135; IV Piggyback:1527.5] Out: 7371 [Urine:1380; Blood:3000; Chest Tube:60]  Intake/Output this shift: Total I/O In: 1052.1 [I.V.:181.7; Blood:470; IV Piggyback:400.4] Out: 125 [Urine:85; Chest Tube:40]  Current Meds: Scheduled Meds: . [START ON 09/15/2020] acetaminophen  1,000 mg Oral Q6H   Or  . [START ON 09/15/2020] acetaminophen (TYLENOL) oral liquid 160 mg/5 mL  1,000 mg Per Tube Q6H  . acetaminophen (TYLENOL) oral liquid 160 mg/5 mL  650 mg Per Tube Once   Or  . acetaminophen  650 mg Rectal Once  . [START ON 09/15/2020] allopurinol  300 mg Oral Daily  . [START ON 09/15/2020] aspirin EC  325 mg Oral Daily   Or  . [START ON 09/15/2020] aspirin  324 mg Per Tube Daily  . [START ON  09/15/2020] bisacodyl  10 mg Oral Daily   Or  . [START ON 09/15/2020] bisacodyl  10 mg Rectal Daily  . chlorhexidine gluconate (MEDLINE KIT)  15 mL Mouth Rinse BID  . Chlorhexidine Gluconate Cloth  6 each Topical Daily  . [START ON 09/15/2020] docusate sodium  200 mg Oral Daily  . [START ON 09/15/2020] levothyroxine  25 mcg Oral q AM  . mouth rinse  15 mL Mouth Rinse 10 times per day  . [START ON 09/16/2020] pantoprazole  40 mg Oral Daily  . [START ON 09/15/2020] simvastatin  40 mg Oral q1800  . [START ON 09/15/2020] sodium chloride flush  3 mL Intravenous Q12H   Continuous Infusions: . sodium chloride Stopped (09/14/20 0823)  . [START ON 09/15/2020] sodium chloride Stopped (09/14/20 0800)  . sodium chloride 11.09 mL/hr at 09/14/20 0900  . cefUROXime (ZINACEF)  IV Stopped (09/14/20 0626)  . dexmedetomidine (PRECEDEX) IV infusion 0.7 mcg/kg/hr (09/14/20 0900)  . DOPamine    . esmolol 25 mcg/kg/min (09/14/20 0427)  . famotidine (PEPCID) IV Stopped (09/14/20 0444)  . insulin 6 Units/hr (09/14/20 0923)  . lactated ringers    . lactated ringers    . lactated ringers 20 mL/hr at 09/14/20 0900  . nitroGLYCERIN Stopped (09/14/20 0420)  . norepinephrine (LEVOPHED) Adult infusion 0 mcg/min (  09/14/20 0425)  . phenylephrine (NEO-SYNEPHRINE) Adult infusion 40 mcg/min (09/14/20 0900)  . tranexamic acid (CYKLOKAPRON) infusion ([facility]) Stopped (09/14/20 0959)   PRN Meds:.sodium chloride, dextrose, lactated ringers, levalbuterol, metoprolol tartrate, midazolam, morphine injection, ondansetron (ZOFRAN) IV, oxyCODONE, [START ON 09/15/2020] sodium chloride flush, traMADol  General appearance: sedated on vent , some cough relex Neurologic: intact Heart: ddd paced, underlying sinus at 74 Lungs: diminished breath sounds bibasilar Abdomen: soft, non-tender; bowel sounds normal; no masses,  no organomegaly Extremities: pedal pusles present bilaterial   Lab Results: CBC: Recent Labs    09/13/20 1129  09/13/20 1913 09/13/20 2334 09/14/20 0005 09/14/20 0350 09/14/20 0539  WBC 16.8*  --   --   --  8.9  --   HGB 11.2*   < > 7.0*   < > 6.5* 7.5*  HCT 36.7   < > 22.9*   < > 20.3* 22.0*  PLT 109*  --  46*  --  61*  --    < > = values in this interval not displayed.   BMET:  Recent Labs    09/13/20 1129 09/13/20 1913 09/14/20 0053 09/14/20 0347 09/14/20 0350 09/14/20 0539  NA 137   < > 141   < > 143 144  K 3.8   < > 4.6   < > 4.1 4.0  CL 100   < > 105  --  107  --   CO2 26  --   --   --  24  --   GLUCOSE 157*   < > 148*  --  197*  --   BUN 31*   < > 28*  --  23  --   CREATININE 1.97*   < > 1.40*  --  1.41*  --   CALCIUM 10.0  --   --   --  7.6*  --    < > = values in this interval not displayed.    CMET: Lab Results  Component Value Date   WBC 8.9 09/14/2020   HGB 7.5 (L) 09/14/2020   HCT 22.0 (L) 09/14/2020   PLT 61 (L) 09/14/2020   GLUCOSE 197 (H) 09/14/2020   CHOL 132 09/01/2019   TRIG 103.0 09/01/2019   HDL 42.20 09/01/2019   LDLCALC 70 09/01/2019   ALT 20 09/13/2020   AST 44 (H) 09/13/2020   NA 144 09/14/2020   K 4.0 09/14/2020   CL 107 09/14/2020   CREATININE 1.41 (H) 09/14/2020   BUN 23 09/14/2020   CO2 24 09/14/2020   TSH 2.77 12/01/2019   INR 1.0 09/14/2020   HGBA1C 5.5 07/20/2020   MICROALBUR 5.9 (H) 10/01/2016      PT/INR:  Recent Labs    09/14/20 0350  LABPROT 12.3  INR 1.0   Radiology: DG Abd 1 View  Result Date: 09/14/2020 CLINICAL DATA:  OG tube placement EXAM: ABDOMEN - 1 VIEW COMPARISON:  None. FINDINGS: An enteric tube is present with tip in the left mid abdomen consistent with location in the body of the stomach. Scattered gas and stool in the colon. Dilated gas-filled cecum measuring 9.8 cm in diameter. This is likely ileus. No small bowel distention. IMPRESSION: Enteric tube tip is in the left mid abdomen consistent with location in the body of the stomach. Electronically Signed   By: Lucienne Capers M.D.   On: 09/14/2020 04:06    CT Head Wo Contrast  Result Date: 09/13/2020 CLINICAL DATA:  Dizziness since first of the week. EXAM: CT HEAD WITHOUT  CONTRAST TECHNIQUE: Contiguous axial images were obtained from the base of the skull through the vertex without intravenous contrast. COMPARISON:  CT head 07/30/2003 report without images. FINDINGS: Brain: Cerebral ventricle sizes are concordant with the degree of cerebral volume loss. Patchy and confluent areas of decreased attenuation are noted throughout the deep and periventricular white matter of the cerebral hemispheres bilaterally, compatible with chronic microvascular ischemic disease. No evidence of large-territorial acute infarction. No parenchymal hemorrhage. No mass lesion. No extra-axial collection. No mass effect or midline shift. No hydrocephalus. Basilar cisterns are patent. Vascular: No hyperdense vessel or unexpected calcification. Skull: Negative for fracture or focal lesion. Sinuses/Orbits: Paranasal sinuses and mastoid air cells are clear. Bilateral lens replacement. Otherwise orbits are unremarkable. Other: None. IMPRESSION: No acute intracranial abnormality. Electronically Signed   By: Iven Finn M.D.   On: 09/13/2020 15:13   CT Chest Wo Contrast  Addendum Date: 09/13/2020   ADDENDUM REPORT: 09/13/2020 17:29 ADDENDUM: Cardiovacsular section should mentioned ascneding thoracic aorta enlarged caliber followed by "Aneurysmal descending thoracic aorta measuring up to 4.1 cm." Electronically Signed   By: Iven Finn M.D.   On: 09/13/2020 17:29   Result Date: 09/13/2020 CLINICAL DATA:  SOB, dizziness since Monday HX renal insufficiency, gout EXAM: CT CHEST WITHOUT CONTRAST TECHNIQUE: Multidetector CT imaging of the chest was performed following the standard protocol without IV contrast. COMPARISON:  None. FINDINGS: Cardiovascular: Normal heart size. Trace pericardial effusion. Ectatic ascending aorta measuring up to 4.1 cm. Aneurysmal ascending thoracic aorta  measuring up to 4.1 cm. Suggestion of an ascending thoracic aorta dissection that extends through the aortic arch to the descending thoracic aorta and suprarenal abdominal aorta. The dissection and aorta itself are collimated off view more distally as abdomen and pelvis were not obtained. Mild-to-moderate atherosclerotic plaque of the thoracic aorta. At least mild four-vessel coronary artery calcifications. Mediastinum/Nodes: Enlarged mediastinal lymph nodes with as an example a 2 cm precarinal lymph no and a 1.1 cm prevascular lymph node (2:55). No gross hilar adenopathy, noting limited sensitivity for the detection of hilar adenopathy on this noncontrast study. Enlarged 1.2 cm right axillary lymph node. Thyroid gland, trachea, and esophagus demonstrate no significant findings. Lungs/Pleura: At least moderate paraseptal and severe centrilobular emphysematous changes. No definite pulmonary nodule. No pulmonary mass. No focal consolidation. Bleb at the left base. Trace left pleural effusion. No pneumothorax. Upper Abdomen: Colon diverticulosis.  No acute abnormality. Musculoskeletal: Fat stranding within the left axilla that appears to be coursing along left subclavian and axillary vessels of unclear etiology (2:36). No suspicious lytic or blastic osseous lesions. No acute displaced fracture. Multilevel degenerative changes of the spine. IMPRESSION: 1. 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 Markedly limited evaluation on this noncontrast study. 2. Trace pericardial effusion. 3. Trace left pleural effusion. 4. Fat stranding surrounding the left subclavian and axillary vasculature of unclear etiology. 5. Enlarged right axillary lymph node measuring up to 1.2 cm. Correlate with recent COVID vaccination; if clinically indicated, consider mammographic evaluation. 6.  Indeterminate mediastinal lymphadenopathy. 7. Aortic Atherosclerosis (ICD10-I70.0) and Emphysema (ICD10-J43.9). These results were called by telephone at the time of interpretation on 09/13/2020 at 4:40 pm to provider [facility] , who verbally acknowledged these results. Electronically Signed: By: Iven Finn M.D. On: 09/13/2020 16:54   DG Chest Port 1 View  Result Date: 09/14/2020 CLINICAL DATA:  Ascending aortic aneurysm repair EXAM: PORTABLE  CHEST 1 VIEW COMPARISON:  Chest CT from yesterday FINDINGS: Endotracheal tube with tip just below the clavicular heads. Enteric tube with tip and side-port reaching the stomach. Swan-Ganz catheter from the right with tip at the right main pulmonary artery. Thoracic drains in place. Cardiopericardial enlargement and diffuse interstitial opacity. No visible collapse or pneumothorax. IMPRESSION: 1. Unremarkable hardware positioning. 2. Pulmonary edema. Electronically Signed   By: Monte Fantasia M.D.   On: 09/14/2020 04:09    CT Angio Chest/Abd/Pel for Dissection W and/or Wo Contrast  Result Date: 09/13/2020 CLINICAL DATA:  Thoracic dissection noted on noncontrast study. Aortic dissection collimated off view on noncontrast study. Evaluate dissection aneurysm further. EXAM: CT ANGIOGRAPHY CHEST, ABDOMEN AND PELVIS TECHNIQUE: Non-contrast CT of the chest was initially obtained. Multidetector CT imaging through the chest, abdomen and pelvis was performed using the standard protocol during bolus administration of intravenous contrast. Multiplanar reconstructed images and MIPs were obtained and reviewed to evaluate the vascular anatomy. CONTRAST:  122m OMNIPAQUE IOHEXOL 350 MG/ML SOLN COMPARISON:  None. FINDINGS: VASCULAR Aorta: Mild-to-moderate atherosclerotic plaque of the aorta. Ectatic ascending aorta measuring up to 4.3cm on axial imaging. Aneurysmal descending thoracic aorta measuring up to 4 cm. The abdominal aorta is normal in caliber. Redemonstration of a  type A thoracic aorta extending from the ascending aorta through the aortic arch, descending thoracic aorta, and abdominal aorta with termination in the mid right common iliac artery. No definite fat stranding surrounding the aorta. No extravascular contrast extravasation. The dissection flap is noted to extend (4: 127 241 155 into the left subclavian artery and left axillary artery (as well as left axillary branch) which correlates with the fat stranding noted on the noncontrast study. Dissection flap extension into the left common carotid artery with marked stenosis of the origin of the left common carotid artery and suggestion of surrounding fat stranding/soft tissue density more distally. No definite dissection flap extension into the vertebral arteries. Please see vascular delineation below regarding further extension of the dissection flap. Pulmonary artery: No central pulmonary embolus. Enlarged in caliber left and right main pulmonary arteries. Celiac: The dissection flap extends into the origin of the celiac artery. Patent without evidence of aneurysm, dissection, vasculitis or significant stenosis. SMA: The dissection flap extends into the origin of the superior mesenteric artery. Patent without evidence of aneurysm, dissection, vasculitis or significant stenosis. Renals: Complete non opacification of the right renal artery that originates from the false lumen. The left renal artery originates from the true lumen with no extension of dissection flap into it. The left renal artery is patent without significant stenosis. There is however as these moderate narrowing of its origin due to atherosclerotic plaque. IMA: Possible dissection flap extending into the origin of the inferior mesenteric artery. Patent without evidence of aneurysm, dissection, vasculitis or significant stenosis. Iliacs: Asymmetric opacification of the iliac arteries with the right common iliac artery being supplied by both the true and  false lumen and the left common iliac artery only being supplied by the true lumen the dissection. Normal in caliber. Distally patent. No significant stenosis. Veins: No obvious venous abnormality within the limitations of this arterial phase study. Review of the MIP images confirms the above findings. NON-VASCULAR Cardiovascular: Normal heart size. Redemonstration of a similar appearing at least trace pericardial effusion. At least mild four-vessel coronary artery calcifications. Mediastinum/Nodes: Redemonstration of several enlarged mediastinal lymph nodes with as an example a 1.7 cm precarinal and a 1.1 cm prevascular lymph node. No hilar lymphadenopathy. Redemonstration of  several enlarged 1.2 cm right axillary lymph node. Thyroid gland, trachea, and esophagus demonstrate no significant findings. Lungs/Pleura: At least moderate paraseptal and severe centrilobular emphysematous changes. There is a 6 mm left upper lobe pulmonary nodule that appears to be sub solid in density (5:23). No pulmonary mass. No focal consolidation. Bleb at the left base. Trace left pleural effusion. No pneumothorax. Hepatobiliary: No focal liver abnormality is seen. Several calcified gallstones within the gallbladder lumen measuring up to at least 1.6 cm. No gallbladder wall thickening or pericholecystic fluid. No biliary ductal dilatation. Biliary dilatation. Pancreas: Unremarkable. No pancreatic ductal dilatation or surrounding inflammatory changes. Spleen: Normal in size without focal abnormality. Slightly heterogeneous splenic parenchyma likely due to timing of contrast. Adrenals/Urinary Tract: No adrenal nodule bilaterally. Homogeneous unremarkable enhancement of the left kidney. Complete non-opacification of the right kidney. No hydronephrosis. No hydroureter. The urinary bladder is unremarkable. Stomach/Bowel: Stomach is within normal limits. No evidence of bowel wall thickening, distention, or inflammatory changes. Scattered  colonic diverticulosis. The appendix is not definitely identified. Lymphatic: No abdominal, pelvic, inguinal lymphadenopathy. Reproductive: Status post hysterectomy. No adnexal masses. Other: No intraperitoneal free fluid. No intraperitoneal free gas. No organized fluid collection. Musculoskeletal: No abdominal wall hernia or abnormality Diffusely decreased bone density. No suspicious lytic or blastic osseous lesions. No acute displaced fracture. Multilevel degenerative changes of the spine with intervertebral disc space vacuum phenomenon at the L5-S1 level. Review of the MIP images confirms the above findings. IMPRESSION: 1. 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 Associated aneurysmal dilatation of the ascending and descending thoracic aorta. The abdominal aorta is normal in caliber. 2. Dissection flap extends into the left subclavian artery, left axillary artery, and left axillary branch. Consider left upper extremity angiography for further evaluation more distally. Correlate with physical exam. 3. Dissection flap extends into the left common carotid artery with marked stenosis of its origin and suggestion of surrounding fat stranding/soft tissue density more distally. Consider CT angiography neck for further evaluation. 4. Dissection flap extends into the origin of the celiac, superior mesenteric, and likely inferior mesenteric arteries. Distally the arteries are patent. 5. Complete devascularization of the right kidney due to the right renal artery originating from the excluded lumen. 6. A 6 mm subsolid left upper lobe pulmonary nodule. Follow-up non-contrast CT recommended at 3-6 months to confirm persistence. If unchanged, and solid component remains <6 mm, annual CT is recommended until 5 years of stability has been established. If persistent these nodules should be considered highly suspicious if the solid component of the nodule is 6 mm or greater in size and  enlarging. This recommendation follows the consensus statement: Guidelines for Management of Incidental Pulmonary Nodules Detected on CT Images: From the Fleischner Society 2017; Radiology 2017; 284:228-243. 7. Enlarged right axillary lymph node measuring up to 1.2 cm. Correlate with recent COVID vaccination; if clinically indicated, consider mammographic evaluation. 8. Indeterminate mediastinal lymphadenopathy. 9. Aortic Atherosclerosis (ICD10-I70.0) and Emphysema (ICD10-J43.9). These results were called by telephone at the time of interpretation on 09/13/2020 at 5:42 pm to provider [facility] , who verbally acknowledged these results. Flap extends into the origin and proximal Electronically Signed   By: Iven Finn M.D.   On: 09/13/2020 18:14     Assessment/Plan: S/P Procedure(s) (LRB): THORACIC ASCENDING ANEURYSM REPAIR (AAA) USING HEMASHIELD PLATINUM 32MM x 30CM WOVEN DOUBLE VELOUR GRAFT (N/A) Cr elevated preop  Making urine  Not bleeding Expected Acute  Blood - loss Anemia- continue to  monitor  Give additional unit of blood Slowly wean Peridex assess neuro function        [name] 09/14/2020 10:03 AM

## 2020-09-14 NOTE — Brief Op Note (Addendum)
      [address]       [place] [address]             [phone]    09/13/2020  12:34 AM  PATIENT:  Danielle Atkins  85 y.o. female  PRE-OPERATIVE DIAGNOSIS:  Acute Type 1 Aortic Dissection  POST-OPERATIVE DIAGNOSIS:  Acute Type 1 Aortic Dissection  PROCEDURE:  MEDIAN STERNOTOMY, EXTRACORPOREAL CIRCULATION, REPLACEMENT OF ASCENDING AORTA (STRAIGHT GRAFT) using a 32 mm Hema shield graft under deep hypothermic circulatory arrest  SURGEON:  Surgeon(s) and Role:    [name], MD - Primary  PHYSICIAN ASSISTANT: Lars Pinks PA-C  ASSISTANTS: Rodman Key RNFA  ANESTHESIA:   general  EBL:  Per anesthesia and perfusion record  DRAINS: Chest tubes placed in the mediastinal and pleural spaces   SPECIMEN:  Source of Specimen:  Portion of native aorta with dissection  DISPOSITION OF SPECIMEN:  PATHOLOGY  COUNTS CORRECT:  YES  DICTATION: .Dragon Dictation  PLAN OF CARE: Admit to inpatient   PATIENT DISPOSITION:  [name] - intubated and critically ill.   Delay start of Pharmacological VTE agent (>24hrs) due to surgical blood loss or risk of bleeding: yes  BASELINE WEIGHT: 73.9 kg

## 2020-09-14 NOTE — Progress Notes (Signed)
CRITICAL VALUE ALERT  Critical Value:  6.5 hemoglobin  Date & Time Notied:  09/14/20 0430  Provider Notified: Lanelle Bal  Orders Received/Actions taken: MD ordered 1 unit packed red blood cell, keep MAP of 72, redraw ABG in 1 hour

## 2020-09-14 NOTE — Progress Notes (Signed)
      [address]       [place] [address]             [phone]      S/p repair of aortic dissection  Intubated, sedated but follows commands  BP 96/62   Pulse 89   Temp 98.4 F (36.9 C)   Resp (!) 21   Ht 5' 4.5" (1.638 m)   Wt 73.9 kg   SpO2 91%   BMI 27.55 kg/m   21/8 CI= 2.4  Milrinone 0.3, dopamine 2.5  IMV 15/70%/5 PEEP 7.47/35/54  Intake/Output Summary (Last 24 hours) at 09/14/2020 1738 Last data filed at 09/14/2020 1700 Gross per 24 hour  Intake 11212.1 ml  Output 5040 ml  Net 6172.1 ml   Hct = 29, calcium 1.12 CBG well controlled  Stable early postop Wean vent when oxygenation improves  Remo Lipps C. Roxan Hockey, MD [facility] [phone]

## 2020-09-14 NOTE — Hospital Course (Addendum)
History of Present Illness:  Patient presents approximately 6:30 PM to [facility] with a CT scan done earlier today with obvious type I aortic dissection.  The patient notes that she came to the [facility] emergency room on Tuesday having chest discomfort and spent 9 hours-the patient was never adequately evaluated nor treated in the emergency room.  Ultimately she left because she knew she had a previous appointment with her primary care doctor this morning.  From her primary care doctor visit this morning at 9 AM she was sent back to the emergency room and spent an additional 7 hours waiting for a diagnosis.  2 CT scans were done 1 without contrast which ultimately revealed a Type 1 Aortic Dissection.  The patient has known renal insufficiency,  and she has made no urine since she was in the emergency room for the past 7 hours creatinine is 2.0  She has no previous cardiac history, does have a history of left hemispheric TIA manifested by slurred speech while reading a book several years ago-this is not reoccurred.  She was evaluated by Dr. Servando Snare who recommended emergent surgical intervention.  The risks and benefits of the procedure were explained to the patient and she was agreeable to proceed.  Hospital Course:  The patient was taken emergently to the OR on 09/13/2020.  She underwent Median Sternotomy with Replacement of Ascending Aorta under deep hypothermic circulatory arrest.  She tolerated the procedure and was taken to the SICU in stable but critical condition.  The patient was coagulopathic and required transfusion of multiple units of blood products.  The patient required inotropic support with Neo, Esmolol, and Milrinone.  These were weaned as hemodynamics allowed. She was mobilized and made fair progress with ambulation.    She was transferred to [facility] on 09/22/20. The IV milrinone was continued for RV failure. A VQ scan was done and ruled out PE. Cardiac MRI was scheduled for  09/24/20 and showed***

## 2020-09-14 NOTE — Anesthesia Postprocedure Evaluation (Signed)
Anesthesia Post Note  Patient: Teacher, early years/pre  Procedure(s) Performed: THORACIC ASCENDING ANEURYSM REPAIR (AAA) USING HEMASHIELD PLATINUM 32MM x 30CM WOVEN DOUBLE VELOUR GRAFT (N/A Chest)     Patient location during evaluation: SICU Anesthesia Type: General Level of consciousness: sedated Pain management: pain level controlled Vital Signs Assessment: post-procedure vital signs reviewed and stable Respiratory status: patient remains intubated per anesthesia plan Cardiovascular status: stable Postop Assessment: no apparent nausea or vomiting Anesthetic complications: no   No complications documented.  Last Vitals:  Vitals:   09/13/20 1730 09/14/20 0343  BP: (!) 112/91 (!) 76/45  Pulse: 97 80  Resp: (!) 26 15  Temp:    SpO2: 98%     Last Pain:  Vitals:   09/13/20 1608  TempSrc:   PainSc: 3                  Janis Sol,W. EDMOND

## 2020-09-14 NOTE — Progress Notes (Signed)
[facility] DAILY [name] PROGRESS NOTE                   [address]            [place] [address]          [phone]   1 Day Post-Op Procedure(s) (LRB): THORACIC ASCENDING ANEURYSM REPAIR (AAA) USING HEMASHIELD PLATINUM 32MM x 30CM WOVEN DOUBLE VELOUR GRAFT (N/A)  Total Length of Stay:  LOS: 1 day   Subjective:  Sedated on vent.  Arrived from OR around 330 this morning.  Objective: Vital signs in last 24 hours: Temp:  [93.4 F (34.1 C)-98.2 F (36.8 C)] 98.2 F (36.8 C) (10/01 0800) Pulse Rate:  [78-115] 79 (10/01 0800) Cardiac Rhythm: Normal sinus rhythm (10/01 0500) Resp:  [15-30] 25 (10/01 0800) BP: (76-122)/(45-91) 91/57 (10/01 0800) SpO2:  [94 %-100 %] 100 % (10/01 0800) Arterial Line BP: (70-110)/(40-64) 106/55 (10/01 0800) FiO2 (%):  [100 %] 100 % (10/01 0800) Weight:  [73.9 kg-74.3 kg] 73.9 kg (09/30 1012)  Filed Weights   09/13/20 1012  Weight: 73.9 kg    Weight change:    Hemodynamic parameters for last 24 hours: PAP: (32-45)/(9-15) 34/11 CO:  [2.8 L/min-3 L/min] 2.8 L/min CI:  [1.5 L/min/m2-1.7 L/min/m2] 1.6 L/min/m2  Intake/Output from previous day: 09/30 0701 - 10/01 0700 In: 8859.3 [I.V.:4196.9; Blood:3135; IV Piggyback:1527.5] Out: 5498 [Urine:1380; Blood:3000; Chest Tube:60]  Intake/Output this shift: Total I/O In: 796.3 [I.V.:94.7; Blood:470; IV Piggyback:231.6] Out: 70 [Urine:50; Chest Tube:20]  Current Meds: Scheduled Meds: . [START ON 09/15/2020] acetaminophen  1,000 mg Oral Q6H   Or  . [START ON 09/15/2020] acetaminophen (TYLENOL) oral liquid 160 mg/5 mL  1,000 mg Per Tube Q6H  . acetaminophen (TYLENOL) oral liquid 160 mg/5 mL  650 mg Per Tube Once   Or  . acetaminophen  650 mg Rectal Once  . [START ON 09/15/2020] allopurinol  300 mg Oral Daily  . [START ON 09/15/2020] aspirin EC  325 mg Oral Daily   Or  . [START ON 09/15/2020] aspirin  324 mg Per Tube Daily  . [START ON 09/15/2020] bisacodyl  10 mg Oral Daily   Or  .  [START ON 09/15/2020] bisacodyl  10 mg Rectal Daily  . chlorhexidine gluconate (MEDLINE KIT)  15 mL Mouth Rinse BID  . Chlorhexidine Gluconate Cloth  6 each Topical Daily  . [START ON 09/15/2020] docusate sodium  200 mg Oral Daily  . [START ON 09/15/2020] levothyroxine  25 mcg Oral q AM  . mouth rinse  15 mL Mouth Rinse 10 times per day  . [START ON 09/16/2020] pantoprazole  40 mg Oral Daily  . [START ON 09/15/2020] simvastatin  40 mg Oral q1800  . [START ON 09/15/2020] sodium chloride flush  3 mL Intravenous Q12H   Continuous Infusions: . sodium chloride 20 mL/hr at 09/14/20 0804  . [START ON 09/15/2020] sodium chloride Stopped (09/14/20 0800)  . sodium chloride 11.09 mL/hr at 09/14/20 0800  . cefUROXime (ZINACEF)  IV Stopped (09/14/20 2641)  . dexmedetomidine (PRECEDEX) IV infusion 0.7 mcg/kg/hr (09/14/20 0800)  . DOPamine 0 mcg/kg/min (09/14/20 0436)  . esmolol 25 mcg/kg/min (09/14/20 0427)  . famotidine (PEPCID) IV Stopped (09/14/20 0444)  . insulin 3.4 Units/hr (09/14/20 0800)  . lactated ringers    . lactated ringers    . lactated ringers 20 mL/hr at 09/14/20 0805  . milrinone 0.3 mcg/kg/min (09/14/20 0800)  . nitroGLYCERIN Stopped (09/14/20 0420)  . norepinephrine (LEVOPHED) Adult infusion  0 mcg/min (09/14/20 0425)  . phenylephrine (NEO-SYNEPHRINE) Adult infusion 34 mcg/min (09/14/20 0800)  . tranexamic acid (CYKLOKAPRON) infusion ([facility]) 1.5 mg/kg/hr (09/14/20 0442)  . vancomycin 1,000 mg (09/14/20 0823)   PRN Meds:.sodium chloride, dextrose, lactated ringers, levalbuterol, metoprolol tartrate, midazolam, morphine injection, ondansetron (ZOFRAN) IV, oxyCODONE, [START ON 09/15/2020] sodium chloride flush, traMADol  General appearance: sedated on vent Heart: regular rate and rhythm Lungs: clear to auscultation bilaterally Abdomen: mild distention, no BS Extremities: extremities normal, atraumatic, no cyanosis or edema and + pulses Wound: aquacel in place  Lab Results: CBC: Recent  Labs    09/13/20 1129 09/13/20 1913 09/13/20 2334 09/14/20 0005 09/14/20 0350 09/14/20 0539  WBC 16.8*  --   --   --  8.9  --   HGB 11.2*   < > 7.0*   < > 6.5* 7.5*  HCT 36.7   < > 22.9*   < > 20.3* 22.0*  PLT 109*  --  46*  --  61*  --    < > = values in this interval not displayed.   BMET:  Recent Labs    09/13/20 1129 09/13/20 1913 09/14/20 0053 09/14/20 0347 09/14/20 0350 09/14/20 0539  NA 137   < > 141   < > 143 144  K 3.8   < > 4.6   < > 4.1 4.0  CL 100   < > 105  --  107  --   CO2 26  --   --   --  24  --   GLUCOSE 157*   < > 148*  --  197*  --   BUN 31*   < > 28*  --  23  --   CREATININE 1.97*   < > 1.40*  --  1.41*  --   CALCIUM 10.0  --   --   --  7.6*  --    < > = values in this interval not displayed.    CMET: Lab Results  Component Value Date   WBC 8.9 09/14/2020   HGB 7.5 (L) 09/14/2020   HCT 22.0 (L) 09/14/2020   PLT 61 (L) 09/14/2020   GLUCOSE 197 (H) 09/14/2020   CHOL 132 09/01/2019   TRIG 103.0 09/01/2019   HDL 42.20 09/01/2019   LDLCALC 70 09/01/2019   ALT 20 09/13/2020   AST 44 (H) 09/13/2020   NA 144 09/14/2020   K 4.0 09/14/2020   CL 107 09/14/2020   CREATININE 1.41 (H) 09/14/2020   BUN 23 09/14/2020   CO2 24 09/14/2020   TSH 2.77 12/01/2019   INR 1.0 09/14/2020   HGBA1C 5.5 07/20/2020   MICROALBUR 5.9 (H) 10/01/2016      PT/INR:  Recent Labs    09/14/20 0350  LABPROT 12.3  INR 1.0   Radiology: DG Abd 1 View  Result Date: 09/14/2020 CLINICAL DATA:  OG tube placement EXAM: ABDOMEN - 1 VIEW COMPARISON:  None. FINDINGS: An enteric tube is present with tip in the left mid abdomen consistent with location in the body of the stomach. Scattered gas and stool in the colon. Dilated gas-filled cecum measuring 9.8 cm in diameter. This is likely ileus. No small bowel distention. IMPRESSION: Enteric tube tip is in the left mid abdomen consistent with location in the body of the stomach. Electronically Signed   By: Lucienne Capers M.D.    On: 09/14/2020 04:06   CT Head Wo Contrast  Result Date: 09/13/2020 CLINICAL DATA:  Dizziness since first of the  week. EXAM: CT HEAD WITHOUT CONTRAST TECHNIQUE: Contiguous axial images were obtained from the base of the skull through the vertex without intravenous contrast. COMPARISON:  CT head 07/30/2003 report without images. FINDINGS: Brain: Cerebral ventricle sizes are concordant with the degree of cerebral volume loss. Patchy and confluent areas of decreased attenuation are noted throughout the deep and periventricular white matter of the cerebral hemispheres bilaterally, compatible with chronic microvascular ischemic disease. No evidence of large-territorial acute infarction. No parenchymal hemorrhage. No mass lesion. No extra-axial collection. No mass effect or midline shift. No hydrocephalus. Basilar cisterns are patent. Vascular: No hyperdense vessel or unexpected calcification. Skull: Negative for fracture or focal lesion. Sinuses/Orbits: Paranasal sinuses and mastoid air cells are clear. Bilateral lens replacement. Otherwise orbits are unremarkable. Other: None. IMPRESSION: No acute intracranial abnormality. Electronically Signed   By: Iven Finn M.D.   On: 09/13/2020 15:13   CT Chest Wo Contrast  Addendum Date: 09/13/2020   ADDENDUM REPORT: 09/13/2020 17:29 ADDENDUM: Cardiovacsular section should mentioned ascneding thoracic aorta enlarged caliber followed by "Aneurysmal descending thoracic aorta measuring up to 4.1 cm." Electronically Signed   By: Iven Finn M.D.   On: 09/13/2020 17:29   Result Date: 09/13/2020 CLINICAL DATA:  SOB, dizziness since Monday HX renal insufficiency, gout EXAM: CT CHEST WITHOUT CONTRAST TECHNIQUE: Multidetector CT imaging of the chest was performed following the standard protocol without IV contrast. COMPARISON:  None. FINDINGS: Cardiovascular: Normal heart size. Trace pericardial effusion. Ectatic ascending aorta measuring up to 4.1 cm. Aneurysmal  ascending thoracic aorta measuring up to 4.1 cm. Suggestion of an ascending thoracic aorta dissection that extends through the aortic arch to the descending thoracic aorta and suprarenal abdominal aorta. The dissection and aorta itself are collimated off view more distally as abdomen and pelvis were not obtained. Mild-to-moderate atherosclerotic plaque of the thoracic aorta. At least mild four-vessel coronary artery calcifications. Mediastinum/Nodes: Enlarged mediastinal lymph nodes with as an example a 2 cm precarinal lymph no and a 1.1 cm prevascular lymph node (2:55). No gross hilar adenopathy, noting limited sensitivity for the detection of hilar adenopathy on this noncontrast study. Enlarged 1.2 cm right axillary lymph node. Thyroid gland, trachea, and esophagus demonstrate no significant findings. Lungs/Pleura: At least moderate paraseptal and severe centrilobular emphysematous changes. No definite pulmonary nodule. No pulmonary mass. No focal consolidation. Bleb at the left base. Trace left pleural effusion. No pneumothorax. Upper Abdomen: Colon diverticulosis.  No acute abnormality. Musculoskeletal: Fat stranding within the left axilla that appears to be coursing along left subclavian and axillary vessels of unclear etiology (2:36). No suspicious lytic or blastic osseous lesions. No acute displaced fracture. Multilevel degenerative changes of the spine. IMPRESSION: 1. 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 Markedly limited evaluation on this noncontrast study. 2. Trace pericardial effusion. 3. Trace left pleural effusion. 4. Fat stranding surrounding the left subclavian and axillary vasculature of unclear etiology. 5. Enlarged right axillary lymph node measuring up to 1.2 cm. Correlate with recent COVID vaccination; if clinically indicated, consider  mammographic evaluation. 6. Indeterminate mediastinal lymphadenopathy. 7. Aortic Atherosclerosis (ICD10-I70.0) and Emphysema (ICD10-J43.9). These results were called by telephone at the time of interpretation on 09/13/2020 at 4:40 pm to provider [facility] , who verbally acknowledged these results. Electronically Signed: By: Iven Finn M.D. On: 09/13/2020 16:54   DG Chest Port 1 View  Result Date: 09/14/2020 CLINICAL DATA:  Ascending  aortic aneurysm repair EXAM: PORTABLE CHEST 1 VIEW COMPARISON:  Chest CT from yesterday FINDINGS: Endotracheal tube with tip just below the clavicular heads. Enteric tube with tip and side-port reaching the stomach. Swan-Ganz catheter from the right with tip at the right main pulmonary artery. Thoracic drains in place. Cardiopericardial enlargement and diffuse interstitial opacity. No visible collapse or pneumothorax. IMPRESSION: 1. Unremarkable hardware positioning. 2. Pulmonary edema. Electronically Signed   By: Monte Fantasia M.D.   On: 09/14/2020 04:09   DG Chest Portable 1 View  Result Date: 09/13/2020 CLINICAL DATA:  Shortness of breath and dizziness EXAM: PORTABLE CHEST 1 VIEW COMPARISON:  March 01, 2019 FINDINGS: Lungs are clear. There is cardiomegaly with pulmonary vascularity normal. No adenopathy. No bone lesions. IMPRESSION: Cardiomegaly.  No edema or airspace opacity. Electronically Signed   By: Lowella Grip III M.D.   On: 09/13/2020 10:46   ECHO INTRAOPERATIVE TEE  Result Date: 09/14/2020  *INTRAOPERATIVE TRANSESOPHAGEAL REPORT *  Patient Name:   Danielle Atkins  Date of Exam: 09/13/2020 Medical Rec #:  [id number]      Height:       64.5 in Accession #:    [id number]     Weight:       162.9 lb Date of Birth:  Nov 02, 1935       BSA:          1.80 m Patient Age:    36 years       BP:           112/91 mmHg Patient Gender: F              HR:           93 bpm. Exam Location:  Anesthesiology Transesophogeal exam was perform intraoperatively during surgical  procedure. Patient was closely monitored under general anesthesia during the entirety of examination. Indications:     Aortic Dissection Performing Phys: [id number] Diagnosing Phys: Roderic Palau MD Complications: No known complications during this procedure. POST-OP IMPRESSIONS - Left Ventricle: The left ventricle is unchanged from pre-bypass. - Aorta: A graft was placed in the ascending aorta for repair. - Aortic Valve: There is moderate regurgitation. - Mitral Valve: The mitral valve appears unchanged from pre-bypass. - Tricuspid Valve: The tricuspid valve appears unchanged from pre-bypass. PRE-OP FINDINGS  Left Ventricle: The left ventricle has low normal systolic function, with an ejection fraction of 50-55%. The cavity size was normal. There is no increase in left ventricular wall thickness. Right Ventricle: The right ventricle has mildly reduced systolic function. The cavity was moderately enlarged. There is no increase in right ventricular wall thickness. Left Atrium: Left atrial size was not assessed. Right Atrium: Right atrial size was not assessed. Interatrial Septum: No atrial level shunt detected by color flow Doppler. Pericardium: A moderately sized pericardial effusion is present. The pericardial effusion is circumferential. There is no evidence of cardiac tamponade. Mitral Valve: The mitral valve is normal in structure. Mitral valve regurgitation is trivial by color flow Doppler. Tricuspid Valve: The tricuspid valve was normal in structure. Tricuspid valve regurgitation is moderate by color flow Doppler. Aortic Valve: The aortic valve is tricuspid Aortic valve regurgitation is moderate to severe by color flow Doppler. Pulmonic Valve: The pulmonic valve was normal in structure. Pulmonic valve regurgitation is not visualized by color flow Doppler. Aorta: There is evidence of a dissection in the aortic root. The dissection can be classified as a DeBakey Type I. The dissection entry point is at the  aortic root.  A false lumen can be seen that compresses the true lumen.  Roderic Palau MD Electronically signed by Roderic Palau MD Signature Date/Time: 09/14/2020/3:49:49 AM    Final    CT Angio Chest/Abd/Pel for Dissection W and/or Wo Contrast  Result Date: 09/13/2020 CLINICAL DATA:  Thoracic dissection noted on noncontrast study. Aortic dissection collimated off view on noncontrast study. Evaluate dissection aneurysm further. EXAM: CT ANGIOGRAPHY CHEST, ABDOMEN AND PELVIS TECHNIQUE: Non-contrast CT of the chest was initially obtained. Multidetector CT imaging through the chest, abdomen and pelvis was performed using the standard protocol during bolus administration of intravenous contrast. Multiplanar reconstructed images and MIPs were obtained and reviewed to evaluate the vascular anatomy. CONTRAST:  187m OMNIPAQUE IOHEXOL 350 MG/ML SOLN COMPARISON:  None. FINDINGS: VASCULAR Aorta: Mild-to-moderate atherosclerotic plaque of the aorta. Ectatic ascending aorta measuring up to 4.3cm on axial imaging. Aneurysmal descending thoracic aorta measuring up to 4 cm. The abdominal aorta is normal in caliber. Redemonstration of a type A thoracic aorta extending from the ascending aorta through the aortic arch, descending thoracic aorta, and abdominal aorta with termination in the mid right common iliac artery. No definite fat stranding surrounding the aorta. No extravascular contrast extravasation. The dissection flap is noted to extend ([phone] into the left subclavian artery and left axillary artery (as well as left axillary branch) which correlates with the fat stranding noted on the noncontrast study. Dissection flap extension into the left common carotid artery with marked stenosis of the origin of the left common carotid artery and suggestion of surrounding fat stranding/soft tissue density more distally. No definite dissection flap extension into the vertebral arteries. Please see vascular  delineation below regarding further extension of the dissection flap. Pulmonary artery: No central pulmonary embolus. Enlarged in caliber left and right main pulmonary arteries. Celiac: The dissection flap extends into the origin of the celiac artery. Patent without evidence of aneurysm, dissection, vasculitis or significant stenosis. SMA: The dissection flap extends into the origin of the superior mesenteric artery. Patent without evidence of aneurysm, dissection, vasculitis or significant stenosis. Renals: Complete non opacification of the right renal artery that originates from the false lumen. The left renal artery originates from the true lumen with no extension of dissection flap into it. The left renal artery is patent without significant stenosis. There is however as these moderate narrowing of its origin due to atherosclerotic plaque. IMA: Possible dissection flap extending into the origin of the inferior mesenteric artery. Patent without evidence of aneurysm, dissection, vasculitis or significant stenosis. Iliacs: Asymmetric opacification of the iliac arteries with the right common iliac artery being supplied by both the true and false lumen and the left common iliac artery only being supplied by the true lumen the dissection. Normal in caliber. Distally patent. No significant stenosis. Veins: No obvious venous abnormality within the limitations of this arterial phase study. Review of the MIP images confirms the above findings. NON-VASCULAR Cardiovascular: Normal heart size. Redemonstration of a similar appearing at least trace pericardial effusion. At least mild four-vessel coronary artery calcifications. Mediastinum/Nodes: Redemonstration of several enlarged mediastinal lymph nodes with as an example a 1.7 cm precarinal and a 1.1 cm prevascular lymph node. No hilar lymphadenopathy. Redemonstration of several enlarged 1.2 cm right axillary lymph node. Thyroid gland, trachea, and esophagus demonstrate no  significant findings. Lungs/Pleura: At least moderate paraseptal and severe centrilobular emphysematous changes. There is a 6 mm left upper lobe pulmonary nodule that appears to be sub solid in density (5:23). No pulmonary mass.  No focal consolidation. Bleb at the left base. Trace left pleural effusion. No pneumothorax. Hepatobiliary: No focal liver abnormality is seen. Several calcified gallstones within the gallbladder lumen measuring up to at least 1.6 cm. No gallbladder wall thickening or pericholecystic fluid. No biliary ductal dilatation. Biliary dilatation. Pancreas: Unremarkable. No pancreatic ductal dilatation or surrounding inflammatory changes. Spleen: Normal in size without focal abnormality. Slightly heterogeneous splenic parenchyma likely due to timing of contrast. Adrenals/Urinary Tract: No adrenal nodule bilaterally. Homogeneous unremarkable enhancement of the left kidney. Complete non-opacification of the right kidney. No hydronephrosis. No hydroureter. The urinary bladder is unremarkable. Stomach/Bowel: Stomach is within normal limits. No evidence of bowel wall thickening, distention, or inflammatory changes. Scattered colonic diverticulosis. The appendix is not definitely identified. Lymphatic: No abdominal, pelvic, inguinal lymphadenopathy. Reproductive: Status post hysterectomy. No adnexal masses. Other: No intraperitoneal free fluid. No intraperitoneal free gas. No organized fluid collection. Musculoskeletal: No abdominal wall hernia or abnormality Diffusely decreased bone density. No suspicious lytic or blastic osseous lesions. No acute displaced fracture. Multilevel degenerative changes of the spine with intervertebral disc space vacuum phenomenon at the L5-S1 level. Review of the MIP images confirms the above findings. IMPRESSION: 1. 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 Associated aneurysmal dilatation of the ascending and descending thoracic  aorta. The abdominal aorta is normal in caliber. 2. Dissection flap extends into the left subclavian artery, left axillary artery, and left axillary branch. Consider left upper extremity angiography for further evaluation more distally. Correlate with physical exam. 3. Dissection flap extends into the left common carotid artery with marked stenosis of its origin and suggestion of surrounding fat stranding/soft tissue density more distally. Consider CT angiography neck for further evaluation. 4. Dissection flap extends into the origin of the celiac, superior mesenteric, and likely inferior mesenteric arteries. Distally the arteries are patent. 5. Complete devascularization of the right kidney due to the right renal artery originating from the excluded lumen. 6. A 6 mm subsolid left upper lobe pulmonary nodule. Follow-up non-contrast CT recommended at 3-6 months to confirm persistence. If unchanged, and solid component remains <6 mm, annual CT is recommended until 5 years of stability has been established. If persistent these nodules should be considered highly suspicious if the solid component of the nodule is 6 mm or greater in size and enlarging. This recommendation follows the consensus statement: Guidelines for Management of Incidental Pulmonary Nodules Detected on CT Images: From the Fleischner Society 2017; Radiology 2017; 284:228-243. 7. Enlarged right axillary lymph node measuring up to 1.2 cm. Correlate with recent COVID vaccination; if clinically indicated, consider mammographic evaluation. 8. Indeterminate mediastinal lymphadenopathy. 9. Aortic Atherosclerosis (ICD10-I70.0) and Emphysema (ICD10-J43.9). These results were called by telephone at the time of interpretation on 09/13/2020 at 5:42 pm to provider [facility] , who verbally acknowledged these results. Flap extends into the origin and proximal Electronically Signed   By: Iven Finn M.D.   On: 09/13/2020 18:14     Assessment/Plan: S/P  Procedure(s) (LRB): THORACIC ASCENDING ANEURYSM REPAIR (AAA) USING HEMASHIELD PLATINUM 32MM x 30CM WOVEN DOUBLE VELOUR GRAFT (N/A)  1. CV- NSR- on Milrinone, Neo, and Esmolol-- weaning as tolerated 2. Pulm- sedated on vent, CXR with pulmonary edema/atelectasis, CT output low 3. Renal- known CKD, creatinine improved at 1.4, patient making urine, would likely benefit from diuresis with pulmonary edema, will defer to Dr. Servando Snare 4. Expected post operative blood loss anemia, Hgb at 6.5, blood products ordered 5. Expected post operative thrombocytopenia, ct  is 61...will monitor 6. CBGs- elevated at >200, continue insulin drip per protocol 7. Dispo- patient very early post op, treat coagulopathy, creatinine improved patient making urine, watch platelet count, improve glucose control, ? Diuretics with pulmonary edema   Ellwood Handler, PA-C 09/14/2020 8:27 AM

## 2020-09-14 NOTE — Progress Notes (Signed)
Initial Nutrition Assessment  DOCUMENTATION CODES:   Not applicable  INTERVENTION:   Tube feeding recommendations: - Vital AF 1.2 @ 50 ml/hr (1200 ml/day) via OG tube  Recommended tube feeding regimen provides 1440 kcal, 90 grams of protein, and 973 ml of H2O.   NUTRITION DIAGNOSIS:   Inadequate oral intake related to inability to eat as evidenced by NPO status.  GOAL:   Provide needs based on ASPEN/SCCM guidelines  MONITOR:   Vent status, Labs, Weight trends, Skin, I & O's  REASON FOR ASSESSMENT:   Ventilator    ASSESSMENT:   85 year old female who presented to the ED on 9/30 with SOB. PMH of gout, PUD, HTN, anemia, TIA. Pt found to have type I aortic dissection.   9/30 - s/p thoracic ascending aneurysm repair  Discussed pt with RN and during [name] rounds. No plan for extubation today. RD to leave tube feeding recommendations.  Pt with OG tube in place, currently to low intermittent suction.  Reviewed weight history in chart. Pt with a 5.9 kg weight loss over the last 1 year. This is a 7.4% weight loss which is not significant for timeframe. Unable to obtain diet history at this time.  Patient is currently intubated on ventilator support MV: 10.3 L/min Temp (24hrs), Avg:96.5 F (35.8 C), Min:93.4 F (34.1 C), Max:98.4 F (36.9 C)  Drips: 1/2 NS: 20 ml/hr LR: 20 ml/hr Precedex Dopamine Esmolol Insulin Milrinone Levophed Neosynephrine  Medications reviewed and include: dulcolax, colace, protonix, IV abx  Labs reviewed: ionized calcium 1.11, BUN 25, creatinine 1.66, hemoglobin 9.2 CBG's: 103-228 x 24 hours  UOP: 1380 ml x 24 hours CT: 60 ml x 24 hours I/O's: +5.4 L since admit  NUTRITION - FOCUSED PHYSICAL EXAM:    Most Recent Value  Orbital Region No depletion  Upper Arm Region No depletion  Thoracic and Lumbar Region No depletion  Buccal Region Unable to assess  Temple Region No depletion  Clavicle Bone Region No depletion  Clavicle and  Acromion Bone Region No depletion  Scapular Bone Region Unable to assess  Dorsal Hand No depletion  Patellar Region No depletion  Anterior Thigh Region Mild depletion  Posterior Calf Region No depletion  Edema (RD Assessment) Mild  Hair Reviewed  Eyes Reviewed  Mouth Reviewed  Skin Reviewed  Nails Reviewed       Diet Order:   Diet Order            Diet NPO time specified  Diet effective now                 EDUCATION NEEDS:   No education needs have been identified at this time  Skin:  Skin Assessment: Skin Integrity Issues: Incisions: chest x 2  Last BM:  no documented BM  Height:   Ht Readings from Last 1 Encounters:  09/13/20 5' 4.5" (1.638 m)    Weight:   Wt Readings from Last 1 Encounters:  09/13/20 73.9 kg    BMI:  Body mass index is 27.55 kg/m.  Estimated Nutritional Needs:   Kcal:  1431  Protein:  90-110 grams  Fluid:  >/= 1.5 L    Gaynell Face, MS, RD, LDN Inpatient Clinical Dietitian Please see AMiON for contact information.

## 2020-09-15 ENCOUNTER — Inpatient Hospital Stay (HOSPITAL_COMMUNITY): Payer: Medicare HMO

## 2020-09-15 LAB — COMPREHENSIVE METABOLIC PANEL
ALT: 29 U/L (ref 0–44)
AST: 64 U/L — ABNORMAL HIGH (ref 15–41)
Albumin: 2.9 g/dL — ABNORMAL LOW (ref 3.5–5.0)
Alkaline Phosphatase: 38 U/L (ref 38–126)
Anion gap: 10 (ref 5–15)
BUN: 30 mg/dL — ABNORMAL HIGH (ref 8–23)
CO2: 24 mmol/L (ref 22–32)
Calcium: 7.9 mg/dL — ABNORMAL LOW (ref 8.9–10.3)
Chloride: 108 mmol/L (ref 98–111)
Creatinine, Ser: 1.66 mg/dL — ABNORMAL HIGH (ref 0.44–1.00)
GFR calc Af Amer: 33 mL/min — ABNORMAL LOW (ref >60)
GFR calc non Af Amer: 28 mL/min — ABNORMAL LOW (ref >60)
Glucose, Bld: 88 mg/dL (ref 70–99)
Potassium: 4.1 mmol/L (ref 3.5–5.1)
Sodium: 142 mmol/L (ref 135–145)
Total Bilirubin: 3.5 mg/dL — ABNORMAL HIGH (ref 0.3–1.2)
Total Protein: 5.6 g/dL — ABNORMAL LOW (ref 6.5–8.1)

## 2020-09-15 LAB — POCT I-STAT 7, (LYTES, BLD GAS, ICA,H+H)
Acid-Base Excess: 1 mmol/L (ref 0.0–2.0)
Acid-Base Excess: 3 mmol/L — ABNORMAL HIGH (ref 0.0–2.0)
Acid-Base Excess: 1 mmol/L (ref 0.0–2.0)
Acid-Base Excess: 3 mmol/L — ABNORMAL HIGH (ref 0.0–2.0)
Bicarbonate: 25.5 mmol/L (ref 20.0–28.0)
Bicarbonate: 27.5 mmol/L (ref 20.0–28.0)
Bicarbonate: 25.4 mmol/L (ref 20.0–28.0)
Bicarbonate: 26.8 mmol/L (ref 20.0–28.0)
Calcium, Ion: 1.12 mmol/L — ABNORMAL LOW (ref 1.15–1.40)
Calcium, Ion: 1.08 mmol/L — ABNORMAL LOW (ref 1.15–1.40)
Calcium, Ion: 1.12 mmol/L — ABNORMAL LOW (ref 1.15–1.40)
Calcium, Ion: 1.06 mmol/L — ABNORMAL LOW (ref 1.15–1.40)
HCT: 29 % — ABNORMAL LOW (ref 36.0–46.0)
HCT: 29 % — ABNORMAL LOW (ref 36.0–46.0)
HCT: 30 % — ABNORMAL LOW (ref 36.0–46.0)
HCT: 29 % — ABNORMAL LOW (ref 36.0–46.0)
Hemoglobin: 9.9 g/dL — ABNORMAL LOW (ref 12.0–15.0)
Hemoglobin: 9.9 g/dL — ABNORMAL LOW (ref 12.0–15.0)
Hemoglobin: 10.2 g/dL — ABNORMAL LOW (ref 12.0–15.0)
Hemoglobin: 9.9 g/dL — ABNORMAL LOW (ref 12.0–15.0)
O2 Saturation: 91 %
O2 Saturation: 94 %
O2 Saturation: 95 %
O2 Saturation: 92 %
Patient temperature: 37.3
Patient temperature: 37.2
Patient temperature: 37
Patient temperature: 98.1
Potassium: 4 mmol/L (ref 3.5–5.1)
Potassium: 5.1 mmol/L (ref 3.5–5.1)
Potassium: 4.1 mmol/L (ref 3.5–5.1)
Potassium: 4 mmol/L (ref 3.5–5.1)
Sodium: 145 mmol/L (ref 135–145)
Sodium: 144 mmol/L (ref 135–145)
Sodium: 146 mmol/L — ABNORMAL HIGH (ref 135–145)
Sodium: 144 mmol/L (ref 135–145)
TCO2: 27 mmol/L (ref 22–32)
TCO2: 29 mmol/L (ref 22–32)
TCO2: 27 mmol/L (ref 22–32)
TCO2: 28 mmol/L (ref 22–32)
pCO2 arterial: 39.4 mmHg (ref 32.0–48.0)
pCO2 arterial: 39.2 mmHg (ref 32.0–48.0)
pCO2 arterial: 36.6 mmHg (ref 32.0–48.0)
pCO2 arterial: 36 mmHg (ref 32.0–48.0)
pH, Arterial: 7.42 (ref 7.350–7.450)
pH, Arterial: 7.455 — ABNORMAL HIGH (ref 7.350–7.450)
pH, Arterial: 7.45 (ref 7.350–7.450)
pH, Arterial: 7.478 — ABNORMAL HIGH (ref 7.350–7.450)
pO2, Arterial: 60 mmHg — ABNORMAL LOW (ref 83.0–108.0)
pO2, Arterial: 68 mmHg — ABNORMAL LOW (ref 83.0–108.0)
pO2, Arterial: 73 mmHg — ABNORMAL LOW (ref 83.0–108.0)
pO2, Arterial: 58 mmHg — ABNORMAL LOW (ref 83.0–108.0)

## 2020-09-15 LAB — COOXEMETRY PANEL
Carboxyhemoglobin: 0.9 % (ref 0.5–1.5)
Methemoglobin: 0.9 % (ref 0.0–1.5)
O2 Saturation: 64.3 %
Total hemoglobin: 8.7 g/dL — ABNORMAL LOW (ref 12.0–16.0)

## 2020-09-15 LAB — PREPARE PLATELET PHERESIS
Unit division: 0
Unit division: 0

## 2020-09-15 LAB — BPAM CRYOPRECIPITATE
Blood Product Expiration Date: 202110010624
Blood Product Expiration Date: 202110010624
ISSUE DATE / TIME: 202110010043
ISSUE DATE / TIME: 202110010043
Unit Type and Rh: 6200
Unit Type and Rh: 6200

## 2020-09-15 LAB — CBC
HCT: 20.3 % — ABNORMAL LOW (ref 36.0–46.0)
HCT: 25 % — ABNORMAL LOW (ref 36.0–46.0)
HCT: 28.6 % — ABNORMAL LOW (ref 36.0–46.0)
HCT: 29.6 % — ABNORMAL LOW (ref 36.0–46.0)
Hemoglobin: 6.5 g/dL — CL (ref 12.0–15.0)
Hemoglobin: 7.9 g/dL — ABNORMAL LOW (ref 12.0–15.0)
Hemoglobin: 9.5 g/dL — ABNORMAL LOW (ref 12.0–15.0)
Hemoglobin: 9.6 g/dL — ABNORMAL LOW (ref 12.0–15.0)
MCH: 22.3 pg — ABNORMAL LOW (ref 26.0–34.0)
MCH: 22.3 pg — ABNORMAL LOW (ref 26.0–34.0)
MCH: 24.1 pg — ABNORMAL LOW (ref 26.0–34.0)
MCH: 23.8 pg — ABNORMAL LOW (ref 26.0–34.0)
MCHC: 32 g/dL (ref 30.0–36.0)
MCHC: 31.6 g/dL (ref 30.0–36.0)
MCHC: 33.2 g/dL (ref 30.0–36.0)
MCHC: 32.4 g/dL (ref 30.0–36.0)
MCV: 69.8 fL — ABNORMAL LOW (ref 80.0–100.0)
MCV: 70.6 fL — ABNORMAL LOW (ref 80.0–100.0)
MCV: 72.4 fL — ABNORMAL LOW (ref 80.0–100.0)
MCV: 73.4 fL — ABNORMAL LOW (ref 80.0–100.0)
Platelets: 61 10*3/uL — ABNORMAL LOW (ref 150–400)
Platelets: 77 10*3/uL — ABNORMAL LOW (ref 150–400)
Platelets: 81 10*3/uL — ABNORMAL LOW (ref 150–400)
Platelets: 94 10*3/uL — ABNORMAL LOW (ref 150–400)
RBC: 2.91 MIL/uL — ABNORMAL LOW (ref 3.87–5.11)
RBC: 3.54 MIL/uL — ABNORMAL LOW (ref 3.87–5.11)
RBC: 3.95 MIL/uL (ref 3.87–5.11)
RBC: 4.03 MIL/uL (ref 3.87–5.11)
RDW: 18.4 % — ABNORMAL HIGH (ref 11.5–15.5)
RDW: 17.9 % — ABNORMAL HIGH (ref 11.5–15.5)
RDW: 19.2 % — ABNORMAL HIGH (ref 11.5–15.5)
RDW: 20.3 % — ABNORMAL HIGH (ref 11.5–15.5)
WBC: 8.9 10*3/uL (ref 4.0–10.5)
WBC: 7.6 10*3/uL (ref 4.0–10.5)
WBC: 10.9 10*3/uL — ABNORMAL HIGH (ref 4.0–10.5)
WBC: 13 10*3/uL — ABNORMAL HIGH (ref 4.0–10.5)
nRBC: 0 % (ref 0.0–0.2)
nRBC: 0.3 % — ABNORMAL HIGH (ref 0.0–0.2)
nRBC: 0.3 % — ABNORMAL HIGH (ref 0.0–0.2)
nRBC: 0.2 % (ref 0.0–0.2)

## 2020-09-15 LAB — PREPARE FRESH FROZEN PLASMA
Unit division: 0
Unit division: 0

## 2020-09-15 LAB — BPAM FFP
Blood Product Expiration Date: 202110062359
Blood Product Expiration Date: 202110062359
Blood Product Expiration Date: 202110062359
Blood Product Expiration Date: 202110062359
Blood Product Expiration Date: 202110062359
Blood Product Expiration Date: 202110062359
Blood Product Expiration Date: 202110062359
ISSUE DATE / TIME: 202110010051
ISSUE DATE / TIME: 202110010051
ISSUE DATE / TIME: 202110010051
ISSUE DATE / TIME: 202110010115
ISSUE DATE / TIME: 202110010252
ISSUE DATE / TIME: 202110010252
Unit Type and Rh: 2800
Unit Type and Rh: 8400
Unit Type and Rh: 8400
Unit Type and Rh: 8400
Unit Type and Rh: 8400
Unit Type and Rh: 8400
Unit Type and Rh: 8400

## 2020-09-15 LAB — GLUCOSE, CAPILLARY
Glucose-Capillary: 100 mg/dL — ABNORMAL HIGH (ref 70–99)
Glucose-Capillary: 101 mg/dL — ABNORMAL HIGH (ref 70–99)
Glucose-Capillary: 103 mg/dL — ABNORMAL HIGH (ref 70–99)
Glucose-Capillary: 104 mg/dL — ABNORMAL HIGH (ref 70–99)
Glucose-Capillary: 116 mg/dL — ABNORMAL HIGH (ref 70–99)
Glucose-Capillary: 138 mg/dL — ABNORMAL HIGH (ref 70–99)
Glucose-Capillary: 170 mg/dL — ABNORMAL HIGH (ref 70–99)
Glucose-Capillary: 177 mg/dL — ABNORMAL HIGH (ref 70–99)
Glucose-Capillary: 90 mg/dL (ref 70–99)
Glucose-Capillary: 90 mg/dL (ref 70–99)
Glucose-Capillary: 96 mg/dL (ref 70–99)

## 2020-09-15 LAB — BPAM PLATELET PHERESIS
Blood Product Expiration Date: 202110022359
Blood Product Expiration Date: 202110022359
ISSUE DATE / TIME: 202110010038
ISSUE DATE / TIME: 202110010038
Unit Type and Rh: 6200
Unit Type and Rh: 8400

## 2020-09-15 LAB — BASIC METABOLIC PANEL
Anion gap: 11 (ref 5–15)
BUN: 29 mg/dL — ABNORMAL HIGH (ref 8–23)
CO2: 22 mmol/L (ref 22–32)
Calcium: 7.8 mg/dL — ABNORMAL LOW (ref 8.9–10.3)
Chloride: 108 mmol/L (ref 98–111)
Creatinine, Ser: 1.57 mg/dL — ABNORMAL HIGH (ref 0.44–1.00)
GFR calc Af Amer: 35 mL/min — ABNORMAL LOW (ref >60)
GFR calc non Af Amer: 30 mL/min — ABNORMAL LOW (ref >60)
Glucose, Bld: 129 mg/dL — ABNORMAL HIGH (ref 70–99)
Potassium: 4.2 mmol/L (ref 3.5–5.1)
Sodium: 141 mmol/L (ref 135–145)

## 2020-09-15 LAB — PREPARE CRYOPRECIPITATE
Unit division: 0
Unit division: 0

## 2020-09-15 LAB — MAGNESIUM: Magnesium: 2.1 mg/dL (ref 1.7–2.4)

## 2020-09-15 MED ORDER — INSULIN DETEMIR 100 UNIT/ML ~~LOC~~ SOLN
10.0000 [IU] | Freq: Two times a day (BID) | SUBCUTANEOUS | Status: DC
  Filled 2020-09-15 (×2): qty 0.1

## 2020-09-15 MED ORDER — INSULIN ASPART 100 UNIT/ML ~~LOC~~ SOLN
0.0000 [IU] | SUBCUTANEOUS | Status: DC
  Administered 2020-09-15: 4 [IU] via SUBCUTANEOUS
  Administered 2020-09-15: 2 [IU] via SUBCUTANEOUS
  Administered 2020-09-15: 4 [IU] via SUBCUTANEOUS
  Administered 2020-09-16 – 2020-09-18 (×8): 2 [IU] via SUBCUTANEOUS
  Administered 2020-09-19: 4 [IU] via SUBCUTANEOUS
  Administered 2020-09-19 – 2020-09-20 (×4): 2 [IU] via SUBCUTANEOUS
  Administered 2020-09-21 – 2020-09-22 (×3): 4 [IU] via SUBCUTANEOUS
  Administered 2020-09-22 – 2020-09-24 (×5): 2 [IU] via SUBCUTANEOUS
  Administered 2020-09-24: 8 [IU] via SUBCUTANEOUS
  Administered 2020-09-25 – 2020-09-26 (×4): 2 [IU] via SUBCUTANEOUS
  Administered 2020-09-27: 4 [IU] via SUBCUTANEOUS
  Administered 2020-09-28 – 2020-10-04 (×6): 2 [IU] via SUBCUTANEOUS

## 2020-09-15 MED ORDER — FUROSEMIDE 10 MG/ML IJ SOLN
40.0000 mg | Freq: Two times a day (BID) | INTRAMUSCULAR | Status: DC
  Administered 2020-09-15 – 2020-09-16 (×3): 40 mg via INTRAVENOUS
  Filled 2020-09-15 (×3): qty 4

## 2020-09-15 MED ORDER — MILRINONE LACTATE IN DEXTROSE 20-5 MG/100ML-% IV SOLN
0.1250 ug/kg/min | INTRAVENOUS | Status: DC
  Administered 2020-09-15 – 2020-09-18 (×4): 0.125 ug/kg/min via INTRAVENOUS
  Filled 2020-09-15 (×3): qty 100

## 2020-09-15 MED ORDER — MILRINONE LACTATE IN DEXTROSE 20-5 MG/100ML-% IV SOLN
0.1250 ug/kg/min | INTRAVENOUS | Status: DC
  Administered 2020-09-15: 0.125 ug/kg/min via INTRAVENOUS
  Filled 2020-09-15: qty 100

## 2020-09-15 MED ORDER — SODIUM CHLORIDE 0.9% FLUSH
10.0000 mL | Freq: Two times a day (BID) | INTRAVENOUS | Status: DC
  Administered 2020-09-15: 20 mL
  Administered 2020-09-16 (×2): 10 mL
  Administered 2020-09-17: 20 mL
  Administered 2020-09-18 – 2020-09-28 (×15): 10 mL
  Administered 2020-09-28: 20 mL
  Administered 2020-09-29 – 2020-10-03 (×9): 10 mL
  Administered 2020-10-03: 20 mL
  Administered 2020-10-04 – 2020-10-05 (×3): 10 mL

## 2020-09-15 NOTE — Progress Notes (Signed)
2 Days Post-Op Procedure(s) (LRB): THORACIC ASCENDING ANEURYSM REPAIR (AAA) USING HEMASHIELD PLATINUM 32MM x 30CM WOVEN DOUBLE VELOUR GRAFT (N/A) Subjective: Extubated herself this AM Currently mildly sedated but responsive and follows commands, no respiratory distress  Objective: Vital signs in last 24 hours: Temp:  [94.1 F (34.5 C)-99.3 F (37.4 C)] 98.6 F (37 C) (10/02 0700) Pulse Rate:  [77-101] 92 (10/02 0748) Cardiac Rhythm: A-V Sequential paced (10/02 0000) Resp:  [15-28] 26 (10/02 0748) BP: (90-121)/(54-73) 121/63 (10/02 0748) SpO2:  [90 %-100 %] 93 % (10/02 0748) Arterial Line BP: (96-144)/(48-64) 106/54 (10/02 0700) FiO2 (%):  [50 %-80 %] 80 % (10/02 0400) Weight:  [89.2 kg] 89.2 kg (10/02 0530)  Hemodynamic parameters for last 24 hours: PAP: (13-42)/(6-12) 13/8 CVP:  [5 mmHg-47 mmHg] 6 mmHg CO:  [3.3 L/min-4.3 L/min] 3.7 L/min CI:  [1.8 L/min/m2-2.4 L/min/m2] 2.1 L/min/m2  Intake/Output from previous day: 10/01 0701 - 10/02 0700 In: 4618.8 [I.V.:2129.3; Blood:1600; IV Piggyback:889.5] Out: 1450 [Urine:1010; Emesis/NG output:150; Chest Tube:290] Intake/Output this shift: No intake/output data recorded.  General appearance: cooperative and no distress Neurologic: nonfocal Heart: regular rate and rhythm Lungs: diminished breath sounds bibasilar Abdomen: normal findings: soft, non-tender  Lab Results: Recent Labs    09/14/20 1954 09/14/20 2221 09/15/20 0323 09/15/20 0539  WBC 8.9  --  10.9*  --   HGB 9.1*   < > 9.5* 9.9*  HCT 28.1*   < > 28.6* 29.0*  PLT 76*  --  81*  --    < > = values in this interval not displayed.   BMET:  Recent Labs    09/14/20 1715 09/14/20 1722 09/15/20 0323 09/15/20 0539  NA 141   < > 142 144  K 3.6   < > 4.1 5.1  CL 108  --  108  --   CO2 24  --  24  --   GLUCOSE 103*  --  88  --   BUN 29*  --  30*  --   CREATININE 1.72*  --  1.66*  --   CALCIUM 8.0*  --  7.9*  --    < > = values in this interval not displayed.     PT/INR:  Recent Labs    09/14/20 0350  LABPROT 12.3  INR 1.0   ABG    Component Value Date/Time   PHART 7.455 (H) 09/15/2020 0539   HCO3 27.5 09/15/2020 0539   TCO2 29 09/15/2020 0539   ACIDBASEDEF 2.0 09/14/2020 0048   O2SAT 94.0 09/15/2020 0539   CBG (last 3)  Recent Labs    09/15/20 0426 09/15/20 0537 09/15/20 0817  GLUCAP 90 103* 96    Assessment/Plan: S/P Procedure(s) (LRB): THORACIC ASCENDING ANEURYSM REPAIR (AAA) USING HEMASHIELD PLATINUM 32MM x 30CM WOVEN DOUBLE VELOUR GRAFT (N/A) -POD # 1.5  NEURO- follows commands with good strength, dc precedex CV- hemodynamically stable on dopamine and milrinone, CI= 2.5  Decrease milrinone to 0.125, wean dopamine as BP allows RESP- self extubated this AM  So far is stable from a respiratory standpoint on a 70% NRB  ABG 7.45/39/68/29  Monitor closely RENAL- creatinine stable, lytes OK  Diurese ENDO- CBG well controlled- dc insulin drip  SSI q4 Anemia secondary to ABL- stable, follow Thrombocytopenia- stable, no evidence of bleeding SCD for DVT prophylaxis, no enoxaparin in setting of thrombocytopenia Gi- keep NPO this AM until respiratory status declares Dc chest tubes     LOS: 2 days    Melrose Nakayama 09/15/2020

## 2020-09-15 NOTE — Progress Notes (Signed)
0745-entered pt room to find pt self extubated. RT called and  NRB placed on pt. MD aware. Pt is stable at this time

## 2020-09-15 NOTE — Progress Notes (Addendum)
RT was called by RN stating pt had self extubated. RT at bedside at this time. Pt does not seem to be in distress. Pt is talking at this time. Pt placed on a NRB and VSS at this time. RT will continue to monitor pt.

## 2020-09-15 NOTE — Progress Notes (Signed)
      [address]       [place] [address]             [phone]      Resting comfortably  BP 110/78   Pulse 88   Temp 100 F (37.8 C)   Resp (!) 33   Ht 5' 4.5" (1.638 m)   Wt 89.2 kg   SpO2 90%   BMI 33.23 kg/m  On 15L HFNC 96% sat  Intake/Output Summary (Last 24 hours) at 09/15/2020 1754 Last data filed at 09/15/2020 1600 Gross per 24 hour  Intake 1777.45 ml  Output 2705 ml  Net -927.55 ml  K= 4.2, creatinine 1.57  Still on low dose milrinone and dopamine Good index- dc Swan, can follow co-ox Clear liquids  Danielle Lipps C. Roxan Hockey, MD [facility] [phone]

## 2020-09-16 ENCOUNTER — Inpatient Hospital Stay (HOSPITAL_COMMUNITY): Payer: Medicare HMO

## 2020-09-16 DIAGNOSIS — J90 Pleural effusion, not elsewhere classified: Secondary | ICD-10-CM

## 2020-09-16 DIAGNOSIS — J9601 Acute respiratory failure with hypoxia: Secondary | ICD-10-CM

## 2020-09-16 DIAGNOSIS — I7103 Dissection of thoracoabdominal aorta: Secondary | ICD-10-CM

## 2020-09-16 DIAGNOSIS — N184 Chronic kidney disease, stage 4 (severe): Secondary | ICD-10-CM

## 2020-09-16 DIAGNOSIS — J810 Acute pulmonary edema: Secondary | ICD-10-CM

## 2020-09-16 LAB — BASIC METABOLIC PANEL
Anion gap: 11 (ref 5–15)
Anion gap: 8 (ref 5–15)
BUN: 31 mg/dL — ABNORMAL HIGH (ref 8–23)
BUN: 27 mg/dL — ABNORMAL HIGH (ref 8–23)
CO2: 25 mmol/L (ref 22–32)
CO2: 27 mmol/L (ref 22–32)
Calcium: 7.9 mg/dL — ABNORMAL LOW (ref 8.9–10.3)
Calcium: 7.5 mg/dL — ABNORMAL LOW (ref 8.9–10.3)
Chloride: 106 mmol/L (ref 98–111)
Chloride: 108 mmol/L (ref 98–111)
Creatinine, Ser: 1.56 mg/dL — ABNORMAL HIGH (ref 0.44–1.00)
Creatinine, Ser: 1.43 mg/dL — ABNORMAL HIGH (ref 0.44–1.00)
GFR calc Af Amer: 35 mL/min — ABNORMAL LOW (ref >60)
GFR calc Af Amer: 39 mL/min — ABNORMAL LOW (ref >60)
GFR calc non Af Amer: 30 mL/min — ABNORMAL LOW (ref >60)
GFR calc non Af Amer: 34 mL/min — ABNORMAL LOW (ref >60)
Glucose, Bld: 166 mg/dL — ABNORMAL HIGH (ref 70–99)
Glucose, Bld: 117 mg/dL — ABNORMAL HIGH (ref 70–99)
Potassium: 3.8 mmol/L (ref 3.5–5.1)
Potassium: 4.8 mmol/L (ref 3.5–5.1)
Sodium: 142 mmol/L (ref 135–145)
Sodium: 143 mmol/L (ref 135–145)

## 2020-09-16 LAB — CBC
HCT: 29.4 % — ABNORMAL LOW (ref 36.0–46.0)
Hemoglobin: 9.1 g/dL — ABNORMAL LOW (ref 12.0–15.0)
MCH: 23.1 pg — ABNORMAL LOW (ref 26.0–34.0)
MCHC: 31 g/dL (ref 30.0–36.0)
MCV: 74.6 fL — ABNORMAL LOW (ref 80.0–100.0)
Platelets: 104 10*3/uL — ABNORMAL LOW (ref 150–400)
RBC: 3.94 MIL/uL (ref 3.87–5.11)
RDW: 20.4 % — ABNORMAL HIGH (ref 11.5–15.5)
WBC: 14.3 10*3/uL — ABNORMAL HIGH (ref 4.0–10.5)
nRBC: 0.4 % — ABNORMAL HIGH (ref 0.0–0.2)

## 2020-09-16 LAB — GLUCOSE, CAPILLARY
Glucose-Capillary: 101 mg/dL — ABNORMAL HIGH (ref 70–99)
Glucose-Capillary: 109 mg/dL — ABNORMAL HIGH (ref 70–99)
Glucose-Capillary: 117 mg/dL — ABNORMAL HIGH (ref 70–99)
Glucose-Capillary: 127 mg/dL — ABNORMAL HIGH (ref 70–99)
Glucose-Capillary: 140 mg/dL — ABNORMAL HIGH (ref 70–99)
Glucose-Capillary: 140 mg/dL — ABNORMAL HIGH (ref 70–99)
Glucose-Capillary: 153 mg/dL — ABNORMAL HIGH (ref 70–99)
Glucose-Capillary: 89 mg/dL (ref 70–99)

## 2020-09-16 LAB — COOXEMETRY PANEL
Carboxyhemoglobin: 0.9 % (ref 0.5–1.5)
Methemoglobin: 0.7 % (ref 0.0–1.5)
O2 Saturation: 65.3 %
Total hemoglobin: 9.8 g/dL — ABNORMAL LOW (ref 12.0–16.0)

## 2020-09-16 LAB — MAGNESIUM: Magnesium: 2 mg/dL (ref 1.7–2.4)

## 2020-09-16 MED ORDER — POTASSIUM CHLORIDE 10 MEQ/50ML IV SOLN
10.0000 meq | INTRAVENOUS | Status: AC
  Administered 2020-09-16 (×4): 10 meq via INTRAVENOUS
  Filled 2020-09-16 (×4): qty 50

## 2020-09-16 MED ORDER — LATANOPROST 0.005 % OP SOLN
1.0000 [drp] | Freq: Every day | OPHTHALMIC | Status: DC
  Administered 2020-09-16 – 2020-10-05 (×20): 1 [drp] via OPHTHALMIC
  Filled 2020-09-16: qty 2.5

## 2020-09-16 MED ORDER — FUROSEMIDE 10 MG/ML IJ SOLN
12.0000 mg/h | INTRAVENOUS | Status: DC
  Administered 2020-09-16: 4 mg/h via INTRAVENOUS
  Administered 2020-09-18 – 2020-09-20 (×3): 6 mg/h via INTRAVENOUS
  Filled 2020-09-16 (×5): qty 25

## 2020-09-16 MED ORDER — POTASSIUM CHLORIDE 10 MEQ/100ML IV SOLN: 10.0000 meq | INTRAVENOUS | Status: DC

## 2020-09-16 MED ORDER — POTASSIUM CHLORIDE 10 MEQ/50ML IV SOLN
10.0000 meq | INTRAVENOUS | Status: AC
  Administered 2020-09-16 (×3): 10 meq via INTRAVENOUS
  Filled 2020-09-16 (×3): qty 50

## 2020-09-16 MED ORDER — NOREPINEPHRINE 4 MG/250ML-% IV SOLN: 0.0000 ug/min | INTRAVENOUS | Status: DC

## 2020-09-16 MED ORDER — ENOXAPARIN SODIUM 30 MG/0.3ML ~~LOC~~ SOLN
30.0000 mg | SUBCUTANEOUS | Status: DC
  Administered 2020-09-16 – 2020-10-05 (×20): 30 mg via SUBCUTANEOUS
  Filled 2020-09-16 (×20): qty 0.3

## 2020-09-16 MED ORDER — FAMOTIDINE IN NACL 20-0.9 MG/50ML-% IV SOLN
INTRAVENOUS | Status: AC
  Filled 2020-09-16: qty 50

## 2020-09-16 MED ORDER — ALBUMIN HUMAN 25 % IV SOLN
25.0000 g | Freq: Once | INTRAVENOUS | Status: AC
  Administered 2020-09-16: 12.5 g via INTRAVENOUS

## 2020-09-16 MED ORDER — NITROGLYCERIN IN D5W 200-5 MCG/ML-% IV SOLN
INTRAVENOUS | Status: AC
  Administered 2020-09-16: 5 ug/min via INTRAVENOUS
  Filled 2020-09-16: qty 250

## 2020-09-16 MED ORDER — LEVALBUTEROL HCL 0.63 MG/3ML IN NEBU: 0.6300 mg | INHALATION_SOLUTION | Freq: Four times a day (QID) | RESPIRATORY_TRACT | Status: DC | PRN

## 2020-09-16 MED ORDER — FUROSEMIDE 10 MG/ML IJ SOLN: 60.0000 mg | Freq: Once | INTRAMUSCULAR | Status: AC

## 2020-09-16 MED ORDER — FUROSEMIDE 10 MG/ML IJ SOLN
INTRAMUSCULAR | Status: AC
  Administered 2020-09-16: 60 mg via INTRAVENOUS
  Filled 2020-09-16: qty 6

## 2020-09-16 MED ORDER — FUROSEMIDE 10 MG/ML IJ SOLN
40.0000 mg | Freq: Three times a day (TID) | INTRAMUSCULAR | Status: DC
  Administered 2020-09-16 – 2020-09-17 (×3): 40 mg via INTRAVENOUS
  Filled 2020-09-16 (×3): qty 4

## 2020-09-16 MED ORDER — CHLORHEXIDINE GLUCONATE 0.12 % MT SOLN
OROMUCOSAL | Status: AC
  Administered 2020-09-16: 15 mL via OROMUCOSAL
  Filled 2020-09-16: qty 15

## 2020-09-16 MED ORDER — LIP MEDEX EX OINT
TOPICAL_OINTMENT | CUTANEOUS | Status: DC | PRN
  Filled 2020-09-16: qty 7

## 2020-09-16 MED ORDER — NITROGLYCERIN IN D5W 200-5 MCG/ML-% IV SOLN
0.0000 ug/min | INTRAVENOUS | Status: DC
  Filled 2020-09-16: qty 250

## 2020-09-16 NOTE — Plan of Care (Signed)
Tolerating NRB for the past few hours. Will plan on BiPAP overnight tonight. BMP @ 10pm. Con't NTG and lasix drips. Still low threshold to intubate if progressive respiratory failure.  Julian Hy, DO 09/16/20 7:44 PM [facility]

## 2020-09-16 NOTE — Progress Notes (Signed)
eLink Physician-Brief Progress Note Patient Name: Nalea Salce DOB: 1935/03/07 MRN: [id number]   Date of Service  09/16/2020  HPI/Events of Note  BMP and mag reviewed, acceptable Patient on NRB mask on camera, comfortable  eICU Interventions  Planned to switch to BIPAP, RN/RT aware      Intervention Category Intermediate Interventions: Other:  Margaretmary Lombard 09/16/2020, 11:24 PM

## 2020-09-16 NOTE — Progress Notes (Signed)
      [address]       [place] [address]             [phone]      Mrs. Boyte required BIPAP due to low sats  Dr. Carlis Abbott from [facility] consulting  Will plan to start some IV NTG and change Lasix to a drip to try decrease preload.  Not in distress at this point but will have low threshold to reintubate if necessary  Remo Lipps C. Roxan Hockey, MD [facility] [phone]

## 2020-09-16 NOTE — Progress Notes (Signed)
      [address]       [place] [address]             [phone]      Comfortable at present, no distress  On NRB 96% sat  BP 105/73   Pulse 90   Temp 98.6 F (37 C)   Resp (!) 22   Ht 5' 4.5" (1.638 m)   Wt 88.5 kg   SpO2 93%   BMI 32.97 kg/m   Intake/Output Summary (Last 24 hours) at 09/16/2020 1815 Last data filed at 09/16/2020 1800 Gross per 24 hour  Intake 1157.31 ml  Output 1820 ml  Net -662.69 ml   Continue diuresis  Remo Lipps C. Roxan Hockey, MD [facility] [phone]

## 2020-09-16 NOTE — Consult Note (Signed)
NAME:  Danielle Atkins, MRN:  [id number], DOB:  [birth year], LOS: 3 ADMISSION DATE:  09/13/2020, CONSULTATION DATE:  10/3 REFERRING MD:  Roxan Hockey, CHIEF COMPLAINT:  Worsening hypoxia   Brief History   Acute type I aortic dissection from L carotid artery to iliacs on 9/30, s/p repair with graft  History of present illness   Danielle Atkins is an 85 year old woman with a history of HLD, HTN, PUD with history of UGIB, TIA who presented with CP on 9/27 and unfortunately had a prolonged wait time in the ED before she was evaluated and found to have a type I aortic dissection. She was emergently transferred to [facility] for TCTS evaluation. She underwent an aortic graft repair on 9/30 and was transferred to the [name] intubated. She self-extubated on 10/2 and has had progressively increasing oxygen requirements since. She now requires BiPAP and has been unable to take breaks without desaturations. She denies other complaints currently. Her son is at bedside who has provided some of the history. Per her daughter, she is active at baseline, but has been walking with her friends less over the past 6 months. She has an albuterol inhaler at home that she uses infrequently, but no known lung disease. She has had more SOB for the past ~6 months, but is unsure why. She quit smoking about 25 years ago and was never a heavy smoker.   Past Medical History  HLD HTN CKD2 NIDDM- PTA metformin Gout  Significant Hospital Events     Consults:  PCCM TCTS  Procedures:  Aortic dissection repair trialysis placement  Significant Diagnostic Tests:  TEE LVEF 50-55%, dilated RV  Micro Data:  9/30 urine> NG 9/30 blood> NG  Antimicrobials:  vanc 9/30 -10/1 Cefuroxime 9/30> 10/2  Interim history/subjective:    Objective   Blood pressure 103/73, pulse 90, temperature 97.7 F (36.5 C), temperature source Axillary, resp. rate 18, height 5' 4.5" (1.638 m), weight 88.5 kg, SpO2 93 %. CVP:  [10 mmHg-14 mmHg] 12 mmHg   Vent Mode: BIPAP FiO2 (%):  [60 %-100 %] 60 % Set Rate:  [8 bmp] 8 bmp PEEP:  [7 cmH20] 7 cmH20   Intake/Output Summary (Last 24 hours) at 09/16/2020 1329 Last data filed at 09/16/2020 0700 Gross per 24 hour  Intake 1009.8 ml  Output 1320 ml  Net -310.2 ml   Filed Weights   09/15/20 0530 09/15/20 2100 09/16/20 0500  Weight: 89.2 kg 87.8 kg 88.5 kg    Examination: General: ill appearing elderly woman laying in bed in NAD HENT: [place]/AT, eyes anicteric  Lungs: rhales bilaterally, mild tachypnea, no respiratory distress Cardiovascular: RRR Abdomen: abd soft, NT Extremities: upper extremity edema, no LE edema, no clubbing or cyanosis Neuro: awake and alert, comprehension appears intact, attempting to talk. Globally weak and fatigued appearing. Derm: incisions covered- no significant bleeding into bandages.  CXR 10/3 personally reviewed-pleural effusions bilaterally, cardiogenic pulmonary edema.  Right IJ trial assist catheter in place.  Resolved Hospital Problem list     Assessment & Plan:  Acute hypoxic respiratory failure due to acute pulmonary edema, bilateral pleural effusions -Okay to continue with trial of BiPAP.  If she deteriorates or is unable to progress to the point of taking BiPAP breaks will recommend intubation.  Discussed this with the patient, consented. -Aggressive diuresis and electrolyte repletion -Nitroglycerin to reduce preload.  May need to start low-dose Levophed to tolerate due to hypotension. -Strict I's/O -Albuterol as needed  Type I aortic dissection, status post graft repair -Management  per primary. 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 Roxan Hockey at bedside.  Family (son at bedside, daughter over the phone) updated.  Best practice:  Diet: NPO Pain/Anxiety/Delirium protocol (if indicated): n/a VAP protocol (if indicated): n/a DVT prophylaxis: Lovenox GI prophylaxis: Pantoprazole Glucose control: SSI Mobility: Bedrest Code Status: Full Family Communication: Updated son at bedside and daughter over the phone Disposition: [name]  Labs   CBC: Recent Labs  Lab 09/13/20 1129 09/13/20 1913 09/14/20 1512 09/14/20 1722 09/14/20 1954 09/14/20 2221 09/15/20 0323 09/15/20 0323 09/15/20 0539 09/15/20 0749 09/15/20 1705 09/15/20 2136 09/16/20 0335  WBC 16.8*   < > 7.8  --  8.9  --  10.9*  --   --   --  13.0*  --  14.3*  NEUTROABS 13.5*  --   --   --   --   --   --   --   --   --   --   --   --   HGB 11.2*   < > 9.3*   < > 9.1*   < > 9.5*   < > 9.9* 10.2* 9.6* 9.9* 9.1*  HCT 36.7   < > 28.5*   < > 28.1*   < > 28.6*   < > 29.0* 30.0* 29.6* 29.0* 29.4*  MCV 69.4*   < > 71.8*  --  71.5*  --  72.4*  --   --   --  73.4*  --  74.6*  PLT 109*   < > 81*  --  76*  --  81*  --   --   --  94*  --  104*   < > = values in this interval not displayed.    Basic Metabolic Panel: Recent Labs  Lab 09/14/20 0350 09/14/20 0539 09/14/20 0831 09/14/20 1112 09/14/20 1715 09/14/20 1722 09/15/20 0323 09/15/20 0323 09/15/20 0539 09/15/20 0749 09/15/20 1705 09/15/20 2136 09/16/20 0335  NA 143   < > 141   < > 141   < > 142   < > 144 146* 141 144 142  K 4.1   < > 4.0   < > 3.6   < > 4.1   < > 5.1 4.1 4.2 4.0 3.8  CL 107   < > 106  --  108  --  108  --   --   --  108  --  106  CO2 24   < > 24  --  24  --  24  --   --   --  22  --  25  GLUCOSE 197*   < > 213*  --  103*  --  88  --   --   --  129*  --  166*  BUN 23   < > 25*  --  29*  --  30*  --   --   --  29*  --  31*  CREATININE 1.41*   <  > 1.66*  --  1.72*  --  1.66*  --   --   --  1.57*  --  1.56*  CALCIUM 7.6*   < > 7.9*  --  8.0*  --  7.9*  --   --   --  7.8*  --  7.9*  MG 1.4*  --  2.4  --   --   --   --   --   --   --  2.1  --   --    < > = values in this interval not displayed.   GFR: Estimated Creatinine Clearance: 29.7 mL/min (A) (by C-G formula based on SCr of 1.56 mg/dL (H)). Recent Labs  Lab 09/13/20 1129 09/13/20 1354 09/14/20 0350 09/14/20 1954 09/15/20 0323 09/15/20 1705 09/16/20 0335  WBC 16.8*  --    < > 8.9 10.9* 13.0* 14.3*  LATICACIDVEN 2.2* 1.2  --   --   --   --   --    < > = values in this interval not displayed.    Liver Function Tests: Recent Labs  Lab 09/10/20 1640 09/13/20 1129 09/15/20 0323  AST 21 44* 64*  ALT 11 20 29   ALKPHOS 53 45 38  BILITOT 1.0 2.2* 3.5*  PROT 8.3* 8.5* 5.6*  ALBUMIN 3.5 3.8 2.9*   No results for input(s): LIPASE, AMYLASE in the last 168 hours. No results for input(s): AMMONIA in the last 168 hours.  ABG    Component Value Date/Time   PHART 7.478 (H) 09/15/2020 2136   PCO2ART 36.0 09/15/2020 2136   PO2ART 58 (L) 09/15/2020 2136   HCO3 26.8 09/15/2020 2136   TCO2 28 09/15/2020 2136   ACIDBASEDEF 2.0 09/14/2020 0048   O2SAT 65.3 09/16/2020 0335     Coagulation Profile: Recent Labs  Lab 09/14/20 0350  INR 1.0    Cardiac Enzymes: No results for input(s): CKTOTAL, CKMB, CKMBINDEX, TROPONINI in the last 168 hours.  HbA1C: Hemoglobin A1C  Date/Time Value Ref Range Status  07/20/2020 09:40 AM 5.5 4.0 - 5.6 % Final  12/01/2019 07:07 AM 5.5 4.0 - 5.6 % Final   Hgb A1c MFr Bld  Date/Time Value Ref Range Status  09/01/2019 07:38 AM 6.5 4.6 - 6.5 % Final    Comment:    Glycemic Control Guidelines for People with Diabetes:Non Diabetic:  <6%Goal of Therapy: <7%Additional Action Suggested:  >8%   02/24/2018 09:25 AM 6.2 4.6 - 6.5 % Final    Comment:    Glycemic Control Guidelines for People with Diabetes:Non Diabetic:  <6%Goal of Therapy:  <7%Additional Action Suggested:  >8%     CBG: Recent Labs  Lab 09/15/20 1951 09/15/20 2338 09/16/20 0332 09/16/20 0701 09/16/20 1158  GLUCAP 177* 170* 153* 140* 140*    Review of Systems:   +SOB Otherwise negative Limited due to patient's respiratory status  Past Medical History  She,  has a past medical history of ANEMIA-NOS (06/25/2007), GOUT (11/16/2009), PARESTHESIA, HANDS (03/27/2009), PEPTIC ULCER DISEASE (06/25/2007), RENAL INSUFFICIENCY (06/25/2007), and TRANSIENT ISCHEMIC ATTACK, HX OF (06/25/2007).   Surgical History    Past Surgical History:  Procedure Laterality Date  . THORACIC AORTIC ANEURYSM REPAIR N/A 09/13/2020   Procedure: THORACIC ASCENDING ANEURYSM REPAIR (AAA) USING HEMASHIELD PLATINUM 32MM x 30CM WOVEN DOUBLE VELOUR GRAFT;  Surgeon: [name], MD;  Location: [facility];  Service: Open Heart Surgery;  Laterality: N/A;     Social History   reports that she quit smoking about 20 years ago. She has never used smokeless tobacco. She reports that she does not drink alcohol and does not use drugs.   Family History   Her  family history is not on file.   Allergies No Known Allergies   Home Medications  Prior to Admission medications   Medication Sig Start Date End Date Taking? Authorizing Provider  albuterol (PROAIR HFA) 108 (90 Base) MCG/ACT inhaler Inhale 2 puffs into the lungs every 6 (six) hours as needed for wheezing. 12/28/18  Yes [name], MD  allopurinol (ZYLOPRIM) 300 MG tablet TAKE 1 TABLET EVERY DAY Patient taking differently: Take 300 mg by mouth daily.  07/27/20  Yes [name], [name], MD  amLODipine (NORVASC) 5 MG tablet TAKE 1 TABLET EVERY DAY Patient taking differently: Take 5 mg by mouth daily.  07/31/20  Yes [name], [name], MD  Ascorbic Acid (VITAMIN C) 100 MG tablet Take 1,000 mg by mouth daily. Takes C Complex   Yes [provider]  aspirin 81 MG tablet Take 1 tablet (81 mg total) by mouth  daily. Patient taking differently: Take 325 mg by mouth daily.  06/29/14  Yes [name], MD  cholecalciferol (VITAMIN D3) 25 MCG (1000 UT) tablet Take 5,000 Units by mouth daily.    Yes [provider]  famotidine (PEPCID) 40 MG tablet TAKE 1 TABLET EVERY DAY Patient taking differently: Take 40 mg by mouth daily.  06/21/20  Yes [name], [name], MD  latanoprost (XALATAN) 0.005 % ophthalmic solution 1 drop daily. 06/28/20  Yes [provider]  levothyroxine (SYNTHROID) 50 MCG tablet TAKE 1/2 TABLET EVERY DAY BEFORE BREAKFAST (NEED MD APPOINTMENT) Patient taking differently: Take 25 mcg by mouth daily before breakfast.  09/12/20  Yes [name], [name], MD  metFORMIN (GLUCOPHAGE) 500 MG tablet TAKE 1 TABLET EVERY DAY  WITH  BREAKFAST Patient taking differently: Take 500 mg by mouth daily with breakfast.  04/25/20  Yes [name], [name], MD  simvastatin (ZOCOR) 40 MG tablet TAKE 1 TABLET EVERY DAY Patient taking differently: Take 40 mg by mouth daily.  06/21/20  Yes [name], [name], MD  diclofenac (VOLTAREN) 50 MG EC tablet Take 1 tablet (50 mg total) by mouth 3 (three) times daily as needed. Take 1 tablet TID x 5 days then PRN for pain Patient not taking: Reported on 09/15/2020 09/22/19   Enrique Sack, FNP  ondansetron (ZOFRAN ODT) 4 MG disintegrating tablet Take 1 tablet (4 mg total) by mouth every 8 (eight) hours as needed for nausea or vomiting. Patient not taking: Reported on 09/15/2020 10/27/19   Chase Picket, MD     This patient is critically ill with multiple organ system failure which requires frequent high complexity decision making, assessment, support, evaluation, and titration of therapies. This was completed through the application of advanced monitoring technologies and extensive interpretation of multiple databases. During this encounter critical care time was devoted to patient care services described in this note for  45 minutes.  Julian Hy, DO 09/16/20 2:44 PM [facility]

## 2020-09-16 NOTE — Progress Notes (Signed)
Attempted to sit patient up on edge of the bed this morning but was unable to do so due to patient becoming dizzy and diaphoretic with oxygen saturation dropping to the 80's on 15L HFNC and NRB  RN repositioned patient back into the bed, aside from dropping her oxygen saturations all other vitals  were stable during this episode  The patient recovered quickly and stated that she felt better within minutes of being moved back into the bed.  Patient states that she has had similar episodes of dizziness in the past.

## 2020-09-16 NOTE — Progress Notes (Signed)
3 Days Post-Op Procedure(s) (LRB): THORACIC ASCENDING ANEURYSM REPAIR (AAA) USING HEMASHIELD PLATINUM 32MM x 30CM WOVEN DOUBLE VELOUR GRAFT (N/A) Subjective: RN noted increased WOB this AM Denies pain  Objective: Vital signs in last 24 hours: Temp:  [97.4 F (36.3 C)-100 F (37.8 C)] 97.8 F (36.6 C) (10/03 0358) Pulse Rate:  [86-101] 88 (10/03 0800) Cardiac Rhythm: Normal sinus rhythm (10/03 0400) Resp:  [17-34] 21 (10/03 0800) BP: (92-116)/(60-78) 95/60 (10/03 0800) SpO2:  [88 %-100 %] 89 % (10/03 0800) Arterial Line BP: (91-127)/(53-78) 108/55 (10/03 0300) FiO2 (%):  [100 %] 100 % (10/02 2059) Weight:  [87.8 kg-88.5 kg] 88.5 kg (10/03 0500)  Hemodynamic parameters for last 24 hours: PAP: (12-22)/(0-18) 13/0 CVP:  [3 mmHg-14 mmHg] 12 mmHg  Intake/Output from previous day: 10/02 0701 - 10/03 0700 In: 1362.6 [P.O.:558; I.V.:704.6; IV Piggyback:100.1] Out: 2525 [Urine:2395; Chest Tube:130] Intake/Output this shift: No intake/output data recorded.  General appearance: alert, cooperative and no distress Neurologic: intact Heart: regular rate and rhythm Lungs: diminished breath sounds bibasilar Abdomen: normal findings: soft, non-tender  Lab Results: Recent Labs    09/15/20 1705 09/15/20 1705 09/15/20 2136 09/16/20 0335  WBC 13.0*  --   --  14.3*  HGB 9.6*   < > 9.9* 9.1*  HCT 29.6*   < > 29.0* 29.4*  PLT 94*  --   --  104*   < > = values in this interval not displayed.   BMET:  Recent Labs    09/15/20 1705 09/15/20 1705 09/15/20 2136 09/16/20 0335  NA 141   < > 144 142  K 4.2   < > 4.0 3.8  CL 108  --   --  106  CO2 22  --   --  25  GLUCOSE 129*  --   --  166*  BUN 29*  --   --  31*  CREATININE 1.57*  --   --  1.56*  CALCIUM 7.8*  --   --  7.9*   < > = values in this interval not displayed.    PT/INR:  Recent Labs    09/14/20 0350  LABPROT 12.3  INR 1.0   ABG    Component Value Date/Time   PHART 7.478 (H) 09/15/2020 2136   HCO3 26.8  09/15/2020 2136   TCO2 28 09/15/2020 2136   ACIDBASEDEF 2.0 09/14/2020 0048   O2SAT 65.3 09/16/2020 0335   CBG (last 3)  Recent Labs    09/15/20 2338 09/16/20 0332 09/16/20 0701  GLUCAP 170* 153* 140*    Assessment/Plan: S/P Procedure(s) (LRB): THORACIC ASCENDING ANEURYSM REPAIR (AAA) USING HEMASHIELD PLATINUM 32MM x 30CM WOVEN DOUBLE VELOUR GRAFT (N/A) -] NEURO- no focal deficit CV- in SR, co-ox 65 om low dose dopamine and milrinone  Continue dopamine and milrinone for now as we push diuresis RESP- pulmonary edema, diurese  IS, flutter  Consider BIPAP if sats don't improve RENAL- creatinine stable, continue diuresis ENDO- CBG reasonably well controlled SCD for DVT prophylaxis- will add enoxaparin as platelets > 100K Anemia secondary to ABL- stable   LOS: 3 days    Melrose Nakayama 09/16/2020

## 2020-09-17 ENCOUNTER — Inpatient Hospital Stay (HOSPITAL_COMMUNITY): Payer: Medicare HMO

## 2020-09-17 ENCOUNTER — Inpatient Hospital Stay: Payer: Self-pay

## 2020-09-17 DIAGNOSIS — J9601 Acute respiratory failure with hypoxia: Secondary | ICD-10-CM | POA: Diagnosis not present

## 2020-09-17 DIAGNOSIS — J810 Acute pulmonary edema: Secondary | ICD-10-CM | POA: Diagnosis not present

## 2020-09-17 DIAGNOSIS — R570 Cardiogenic shock: Secondary | ICD-10-CM

## 2020-09-17 DIAGNOSIS — I7101 Dissection of thoracic aorta: Principal | ICD-10-CM

## 2020-09-17 LAB — TYPE AND SCREEN
ABO/RH(D): AB POS
Antibody Screen: NEGATIVE
Unit division: 0
Unit division: 0
Unit division: 0
Unit division: 0
Unit division: 0
Unit division: 0
Unit division: 0
Unit division: 0

## 2020-09-17 LAB — BPAM RBC
Blood Product Expiration Date: 202110292359
Blood Product Expiration Date: 202110292359
Blood Product Expiration Date: 202110292359
Blood Product Expiration Date: 202110292359
Blood Product Expiration Date: 202110292359
Blood Product Expiration Date: 202110292359
Blood Product Expiration Date: 202110292359
Blood Product Expiration Date: 202110292359
ISSUE DATE / TIME: 202109301956
ISSUE DATE / TIME: 202109301956
ISSUE DATE / TIME: 202109301956
ISSUE DATE / TIME: 202109301956
ISSUE DATE / TIME: 202110010548
ISSUE DATE / TIME: 202110011111
Unit Type and Rh: 6200
Unit Type and Rh: 6200
Unit Type and Rh: 6200
Unit Type and Rh: 6200
Unit Type and Rh: 6200
Unit Type and Rh: 6200
Unit Type and Rh: 6200
Unit Type and Rh: 6200

## 2020-09-17 LAB — BASIC METABOLIC PANEL
Anion gap: 10 (ref 5–15)
BUN: 27 mg/dL — ABNORMAL HIGH (ref 8–23)
CO2: 28 mmol/L (ref 22–32)
Calcium: 7.8 mg/dL — ABNORMAL LOW (ref 8.9–10.3)
Chloride: 106 mmol/L (ref 98–111)
Creatinine, Ser: 1.45 mg/dL — ABNORMAL HIGH (ref 0.44–1.00)
GFR calc Af Amer: 39 mL/min — ABNORMAL LOW (ref >60)
GFR calc non Af Amer: 33 mL/min — ABNORMAL LOW (ref >60)
Glucose, Bld: 125 mg/dL — ABNORMAL HIGH (ref 70–99)
Potassium: 3.9 mmol/L (ref 3.5–5.1)
Sodium: 144 mmol/L (ref 135–145)

## 2020-09-17 LAB — COOXEMETRY PANEL
Carboxyhemoglobin: 1 % (ref 0.5–1.5)
Methemoglobin: 1.1 % (ref 0.0–1.5)
O2 Saturation: 62.3 %
Total hemoglobin: 11 g/dL — ABNORMAL LOW (ref 12.0–16.0)

## 2020-09-17 LAB — GLUCOSE, CAPILLARY
Glucose-Capillary: 119 mg/dL — ABNORMAL HIGH (ref 70–99)
Glucose-Capillary: 122 mg/dL — ABNORMAL HIGH (ref 70–99)
Glucose-Capillary: 126 mg/dL — ABNORMAL HIGH (ref 70–99)
Glucose-Capillary: 115 mg/dL — ABNORMAL HIGH (ref 70–99)
Glucose-Capillary: 93 mg/dL (ref 70–99)

## 2020-09-17 LAB — SURGICAL PATHOLOGY

## 2020-09-17 LAB — HEMOGLOBIN A1C
Hgb A1c MFr Bld: 5.8 % — ABNORMAL HIGH (ref 4.8–5.6)
Mean Plasma Glucose: 120 mg/dL

## 2020-09-17 MED ORDER — TRAMADOL HCL 50 MG PO TABS: 50.0000 mg | ORAL_TABLET | Freq: Four times a day (QID) | ORAL | Status: DC

## 2020-09-17 MED ORDER — OXYCODONE HCL 5 MG PO TABS
5.0000 mg | ORAL_TABLET | Freq: Four times a day (QID) | ORAL | Status: DC | PRN
  Administered 2020-09-18 – 2020-09-24 (×2): 5 mg via ORAL
  Filled 2020-09-17 (×2): qty 1

## 2020-09-17 MED ORDER — TRAMADOL HCL 50 MG PO TABS
50.0000 mg | ORAL_TABLET | Freq: Four times a day (QID) | ORAL | Status: DC | PRN
  Administered 2020-09-18 – 2020-10-05 (×17): 50 mg via ORAL
  Filled 2020-09-17 (×19): qty 1

## 2020-09-17 MED ORDER — POTASSIUM CHLORIDE 10 MEQ/50ML IV SOLN
10.0000 meq | INTRAVENOUS | Status: AC
  Administered 2020-09-17 (×2): 10 meq via INTRAVENOUS
  Filled 2020-09-17 (×2): qty 50

## 2020-09-17 NOTE — Progress Notes (Signed)
Nutrition Follow-up  DOCUMENTATION CODES:   Not applicable  INTERVENTION:   If unable to advance diet, recommend placement of Cortrak NG tube and initiation of enteral nutrition. Recommend: - Osmolite 1.2 @ 60 ml/hr (1440 ml/day) - ProSource TF 45 ml daily  Recommended tube feeding regimen would provide 1768 kcal, 91 grams of protein, and 1181 ml of H2O.  - If diet advanced, Ensure Enlive po BID, each supplement provides 350 kcal and 20 grams of protein  NUTRITION DIAGNOSIS:   Inadequate oral intake related to inability to eat as evidenced by NPO status.  Ongoing  GOAL:   Patient will meet greater than or equal to 90% of their needs  Unmet at this time  MONITOR:   Diet advancement, Labs, Weight trends, TF tolerance, Skin, I & O's  REASON FOR ASSESSMENT:   Ventilator    ASSESSMENT:   85 year old female who presented to the ED on 9/30 with SOB. PMH of gout, PUD, HTN, anemia, TIA. Pt found to have type I aortic dissection.  09/30 - s/p thoracic ascending aneurysm repair 10/02 - self-extubated, clear liquid diet 10/03 - NPO  Discussed pt in [name] rounds and with RN. Pt tolerated BiPAP overnight and is on NRB this morning. Pt remains NPO at this time. Per CCM NP, hoping to progress diet pending availability to wean O2. If no significant progress, recommend Cortrak placement and initiation of enteral nutrition. Discussed with CCM. RD to leave tube feeding recommendations.  Admit weight: 89.2 kg Current weight: 81.1 kg  Pt with non-pitting edema to BUE and BLE.  Medications reviewed and include: dulcolax, colace, SSI q 4 hours, protonix Drips: dopamine, lasix, milrinone, nitroglycerin  Labs reviewed: BUN 27, creatinine 1.45, hemoglobin 9.1 CBG's: 101-127 x 24 hours  UOP: 3080 ml x 24 hours I/O's: +3.6 L since admit  Diet Order:   Diet Order            Diet NPO time specified  Diet effective now                 EDUCATION NEEDS:   No education needs have  been identified at this time  Skin:  Skin Assessment: Skin Integrity Issues: Incisions: chest x 2  Last BM:  09/16/20 smear type 4  Height:   Ht Readings from Last 1 Encounters:  09/13/20 5' 4.5" (1.638 m)    Weight:   Wt Readings from Last 1 Encounters:  09/17/20 81.1 kg    BMI:  Body mass index is 30.22 kg/m.  Estimated Nutritional Needs:   Kcal:  1700-1900  Protein:  90-110 grams  Fluid:  1.7-1.9 L    Gaynell Face, MS, RD, LDN Inpatient Clinical Dietitian Please see AMiON for contact information.

## 2020-09-17 NOTE — Progress Notes (Addendum)
NAME:  Danielle Atkins, MRN:  [id number], DOB:  [birth year], LOS: 4 ADMISSION DATE:  09/13/2020, CONSULTATION DATE:  10/3 REFERRING MD:  Danielle Atkins, CHIEF COMPLAINT:  Worsening hypoxia   Brief History   Acute type I aortic dissection from L carotid artery to iliacs on 9/30, s/p repair with graft  History of present illness   Danielle Atkins is an 85 year old woman with a history of HLD, HTN, PUD with history of UGIB, TIA who presented with CP on 9/27 and unfortunately had a prolonged wait time in the ED before she was evaluated and found to have a type I aortic dissection. She was emergently transferred to [facility] for TCTS evaluation. She underwent an aortic graft repair on 9/30 and was transferred to the [name] intubated. She self-extubated on 10/2 and has had progressively increasing oxygen requirements since. She now requires BiPAP and has been unable to take breaks without desaturations. She denies other complaints currently. Her son is at bedside who has provided some of the history. Per her daughter, she is active at baseline, but has been walking with her friends less over the past 6 months. She has an albuterol inhaler at home that she uses infrequently, but no known lung disease. She has had more SOB for the past ~6 months, but is unsure why. She quit smoking about 25 years ago and was never a heavy smoker.   Past Medical History  HLD HTN CKD2 NIDDM- PTA metformin Gout  Significant Hospital Events     Consults:  PCCM TCTS  Procedures:  Aortic dissection repair trialysis placement  Significant Diagnostic Tests:  TEE LVEF 50-55%, dilated RV  Micro Data:  9/30 urine> NG 9/30 blood> NG  Antimicrobials:  vanc 9/30 -10/1 Cefuroxime 9/30> 10/2  Interim history/subjective:   Tolerated BiPAP overnight On NRB this morning   States she felt a little short of breath eating Ice chips.  Is able to speak in full sentences.   Objective   Blood pressure 111/81, pulse (!) 103,  temperature 99.1 F (37.3 C), temperature source Oral, resp. rate (!) 28, height 5' 4.5" (1.638 m), weight 81.1 kg, SpO2 94 %. CVP:  [11 mmHg-16 mmHg] 16 mmHg  Vent Mode: BIPAP FiO2 (%):  [60 %-80 %] 80 % Set Rate:  [8 bmp] 8 bmp PEEP:  [7 cmH20] 7 cmH20   Intake/Output Summary (Last 24 hours) at 09/17/2020 0934 Last data filed at 09/17/2020 0800 Gross per 24 hour  Intake 777.86 ml  Output 3430 ml  Net -2652.14 ml   Filed Weights   09/15/20 2100 09/16/20 0500 09/17/20 0615  Weight: 87.8 kg 88.5 kg 81.1 kg    Examination: General: ill appearing elderly woman laying in bed in NAD HENT: [place]/AT, eyes anicteric  Lungs: Diminished bibasilar rounds, some rales. Symmetrical chest expansion no accessory use on NRB  Cardiovascular: tachycardic rate reg rhythm. S1s2. Cap refill < 3 seconds  Abdomen: soft round ndnt. + bowel sounds  Extremities: non-pitting upper ext edema. No obvious joint deformity. No cyanosis or clubbing  Neuro:AAOx3 following commands, no focal deficit, PERRL Skin: surgical site dressings c/d/i.   Resolved Hospital Problem list     Assessment & Plan:   Acute hypoxic respiratory failure due to acute pulmonary edema, bilateral pleural effusions, ? Atelectasis  P --Has maintained without reintubation (BiPAP overnight, NRB today). Will wean O2 as tolerated  --if deteriorates, low threshold for re-intubation  -Aggressive diuresis and electrolyte repletion -Nitroglycerin to reduce preload -- decreasing 10/4 --IS   --  Mobility. Bed in chair while I was in room, PT ordered by primary   Type I aortic dissection, status post graft repair -Management per primary. 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 If no significant progress, may need cortrak  Pain/Anxiety/Delirium protocol (if indicated): n/a VAP protocol (if indicated): n/a DVT prophylaxis: Lovenox GI prophylaxis: Pantoprazole Glucose control: SSI Mobility: Bedrest Code Status: Full Family Communication: Pending 10/4 Disposition: [name]  Labs   CBC: Recent Labs  Lab 09/13/20 1129 09/13/20 1913 09/14/20 1512 09/14/20 1722 09/14/20 1954 09/14/20 2221 09/15/20 0323 09/15/20 0323 09/15/20 0539 09/15/20 0749 09/15/20 1705 09/15/20 2136 09/16/20 0335  WBC 16.8*   < > 7.8  --  8.9  --  10.9*  --   --   --  13.0*  --  14.3*  NEUTROABS 13.5*  --   --   --   --   --   --   --   --   --   --   --   --   HGB 11.2*   < > 9.3*   < > 9.1*   < > 9.5*   < > 9.9* 10.2* 9.6* 9.9* 9.1*  HCT 36.7   < > 28.5*   < > 28.1*   < > 28.6*   < > 29.0* 30.0* 29.6* 29.0* 29.4*  MCV 69.4*   < > 71.8*  --  71.5*  --  72.4*  --   --   --  73.4*  --  74.6*  PLT 109*   < > 81*  --  76*  --  81*  --   --   --  94*  --  104*   < > = values in this interval not displayed.    Basic Metabolic Panel: Recent Labs  Lab 09/14/20 0350 09/14/20 0539 09/14/20 0831 09/14/20 1112 09/15/20 0323 09/15/20 0539 09/15/20 1705 09/15/20 2136 09/16/20 0335 09/16/20 2225 09/17/20 0448  NA 143   < > 141   < > 142   < > 141 144 142 143 144  K 4.1   < > 4.0   < > 4.1   < > 4.2 4.0 3.8 4.8 3.9  CL 107   < > 106   < > 108  --  108  --  106 108 106  CO2 24   < > 24   < > 24  --  22  --  25 27 28   GLUCOSE 197*   < > 213*   < > 88  --  129*  --  166* 117* 125*  BUN 23   < > 25*   < > 30*  --  29*  --  31* 27*  27*  CREATININE 1.41*   < > 1.66*   < > 1.66*  --  1.57*  --  1.56* 1.43*  1.45*  CALCIUM 7.6*   < > 7.9*   < > 7.9*  --  7.8*  --  7.9* 7.5* 7.8*  MG 1.4*  --  2.4  --   --   --  2.1  --   --  2.0  --    < > = values in this interval not displayed.   GFR: Estimated Creatinine Clearance: 30.6 mL/min (A) (by C-G formula based on SCr of 1.45 mg/dL (H)). Recent Labs  Lab 09/13/20 1129 09/13/20 1354 09/14/20 0350 09/14/20 1954 09/15/20 0323 09/15/20 1705 09/16/20 0335  WBC 16.8*  --    < > 8.9 10.9* 13.0* 14.3*  LATICACIDVEN 2.2* 1.2  --   --   --   --   --    < > = values in this interval not displayed.    Liver Function Tests: Recent Labs  Lab 09/10/20 1640 09/13/20 1129 09/15/20 0323  AST 21 44* 64*  ALT 11 20 29   ALKPHOS 53 45 38  BILITOT 1.0 2.2* 3.5*  PROT 8.3* 8.5* 5.6*  ALBUMIN 3.5 3.8 2.9*   No results for input(s): LIPASE, AMYLASE in the last 168 hours. No results for input(s): AMMONIA in the last 168 hours.  ABG    Component Value Date/Time   PHART 7.478 (H) 09/15/2020 2136   PCO2ART 36.0 09/15/2020 2136   PO2ART 58 (L) 09/15/2020 2136   HCO3 26.8 09/15/2020 2136   TCO2 28 09/15/2020 2136   ACIDBASEDEF 2.0 09/14/2020 0048   O2SAT 62.3 09/17/2020 0448     Coagulation Profile: Recent Labs  Lab 09/14/20 0350  INR 1.0    Cardiac Enzymes: No results for input(s): CKTOTAL, CKMB, CKMBINDEX, TROPONINI in the last 168 hours.  HbA1C: Hemoglobin A1C  Date/Time Value Ref Range Status  07/20/2020 09:40 AM 5.5 4.0 - 5.6 % Final  12/01/2019 07:07 AM 5.5 4.0 - 5.6 % Final   Hgb A1c MFr Bld  Date/Time Value Ref Range Status  09/01/2019 07:38 AM 6.5 4.6 - 6.5 % Final    Comment:    Glycemic Control Guidelines for People with Diabetes:Non Diabetic:  <6%Goal of Therapy: <7%Additional Action Suggested:  >8%   02/24/2018 09:25 AM 6.2 4.6 - 6.5 % Final    Comment:    Glycemic Control Guidelines for People with Diabetes:Non Diabetic:  <6%Goal of Therapy:  <7%Additional Action Suggested:  >8%     CBG: Recent Labs  Lab 09/16/20 1724 09/16/20 2010 09/16/20 2331 09/17/20 0447 09/17/20 0809  GLUCAP 109* 117* 101* 119* 122*    CRITICAL CARE Performed by: Cristal Generous   Total critical care time: 45 minutes  Critical care time was exclusive of separately billable procedures and treating other patients. Critical care was necessary to treat or prevent imminent or life-threatening deterioration.  Critical care was time spent personally by me on the following activities: development of treatment plan with patient and/or surrogate as well as nursing, discussions with consultants, evaluation of patient's response to treatment, examination of patient, obtaining history from patient or surrogate, ordering and performing treatments and interventions, ordering and review of laboratory studies, ordering and review of radiographic studies, pulse oximetry and re-evaluation of patient's condition.  Eliseo Gum MSN, AGACNP-BC [place] [phone] If no answer, [phone] 09/17/2020, 9:34 AM

## 2020-09-17 NOTE — Addendum Note (Signed)
Addendum  created 09/17/20 0702 by Josephine Igo, CRNA   Order list changed

## 2020-09-17 NOTE — Progress Notes (Signed)
Patient ID: Danielle Atkins, female   DOB: 1935-05-27, 85 y.o.   MRN: [id number] TCTS DAILY [name] PROGRESS NOTE                   [address]            [place] [address]          [phone]   4 Days Post-Op Procedure(s) (LRB): THORACIC ASCENDING ANEURYSM REPAIR (AAA) USING HEMASHIELD PLATINUM 32MM x 30CM WOVEN DOUBLE VELOUR GRAFT (N/A)  Total Length of Stay:  LOS: 4 days   Subjective: Patient awake alert, follows commands, moves all extremities without difficulty.  Asks appropriate questions  Objective: Vital signs in last 24 hours: Temp:  [97.3 F (36.3 C)-99.1 F (37.3 C)] 99.1 F (37.3 C) (10/04 0746) Pulse Rate:  [36-108] 88 (10/04 0746) Cardiac Rhythm: Normal sinus rhythm (10/03 2000) Resp:  [17-28] 24 (10/04 0746) BP: (86-117)/(53-81) 100/59 (10/04 0700) SpO2:  [89 %-100 %] 97 % (10/04 0746) Arterial Line BP: (111)/(108) 111/108 (10/03 1200) FiO2 (%):  [60 %-80 %] 80 % (10/04 0400) Weight:  [81.1 kg] 81.1 kg (10/04 0615)  Filed Weights   09/15/20 2100 09/16/20 0500 09/17/20 0615  Weight: 87.8 kg 88.5 kg 81.1 kg    Weight change: -6.7 kg   Hemodynamic parameters for last 24 hours: CVP:  [11 mmHg] 11 mmHg  Intake/Output from previous day: 10/03 0701 - 10/04 0700 In: 756 [I.V.:343.6; IV Piggyback:412.4] Out: 3080 [Urine:3080]  Intake/Output this shift: No intake/output data recorded.  Current Meds: Scheduled Meds: . acetaminophen  1,000 mg Oral Q6H   Or  . acetaminophen (TYLENOL) oral liquid 160 mg/5 mL  1,000 mg Per Tube Q6H  . allopurinol  300 mg Oral Daily  . aspirin EC  325 mg Oral Daily   Or  . aspirin  324 mg Per Tube Daily  . bisacodyl  10 mg Oral Daily   Or  . bisacodyl  10 mg Rectal Daily  . chlorhexidine gluconate (MEDLINE KIT)  15 mL Mouth Rinse BID  . Chlorhexidine Gluconate Cloth  6 each Topical Daily  . docusate sodium  200 mg Oral Daily  . enoxaparin (LOVENOX) injection  30 mg Subcutaneous Q24H  . furosemide  40  mg Intravenous Q8H  . influenza vaccine adjuvanted  0.5 mL Intramuscular Tomorrow-1000  . insulin aspart  0-24 Units Subcutaneous Q4H  . latanoprost  1 drop Both Eyes QHS  . levothyroxine  25 mcg Oral q AM  . pantoprazole  40 mg Oral Daily  . pneumococcal 23 valent vaccine  0.5 mL Intramuscular Tomorrow-1000  . simvastatin  40 mg Oral q1800  . sodium chloride flush  10-40 mL Intracatheter Q12H  . sodium chloride flush  3 mL Intravenous Q12H   Continuous Infusions: . sodium chloride Stopped (09/15/20 1756)  . sodium chloride Stopped (09/14/20 0800)  . sodium chloride 10 mL/hr at 09/16/20 1104  . DOPamine 2 mcg/kg/min (09/17/20 0700)  . esmolol 25 mcg/kg/min (09/14/20 0427)  . furosemide (LASIX) infusion 4 mg/hr (09/17/20 0700)  . lactated ringers    . lactated ringers Stopped (09/14/20 1518)  . lactated ringers Stopped (09/15/20 1905)  . milrinone 0.125 mcg/kg/min (09/17/20 0700)  . nitroGLYCERIN 40 mcg/min (09/17/20 0700)  . norepinephrine (LEVOPHED) Adult infusion Stopped (09/16/20 1624)   PRN Meds:.sodium chloride, dextrose, lactated ringers, levalbuterol, lip balm, metoprolol tartrate, midazolam, morphine injection, ondansetron (ZOFRAN) IV, oxyCODONE, sodium chloride flush, traMADol  General appearance: alert, cooperative and fatigued Neurologic:  intact Heart: regular rate and rhythm, S1, S2 normal, no murmur, click, rub or gallop Lungs: diminished breath sounds bilaterally Abdomen: soft, non-tender; bowel sounds normal; no masses,  no organomegaly Extremities: extremities normal, atraumatic, no cyanosis or edema and Homans sign is negative, no sign of DVT Wound: Sternum stable Right hand is swollen compared to the left, palpable distal pulses bilateral lower extremities  Lab Results: CBC: Recent Labs    09/15/20 1705 09/15/20 1705 09/15/20 2136 09/16/20 0335  WBC 13.0*  --   --  14.3*  HGB 9.6*   < > 9.9* 9.1*  HCT 29.6*   < > 29.0* 29.4*  PLT 94*  --   --  104*    < > = values in this interval not displayed.   BMET:  Recent Labs    09/16/20 2225 09/17/20 0448  NA 143 144  K 4.8 3.9  CL 108 106  CO2 27 28  GLUCOSE 117* 125*  BUN 27* 27*  CREATININE 1.43* 1.45*  CALCIUM 7.5* 7.8*    CMET: Lab Results  Component Value Date   WBC 14.3 (H) 09/16/2020   HGB 9.1 (L) 09/16/2020   HCT 29.4 (L) 09/16/2020   PLT 104 (L) 09/16/2020   GLUCOSE 125 (H) 09/17/2020   CHOL 132 09/01/2019   TRIG 103.0 09/01/2019   HDL 42.20 09/01/2019   LDLCALC 70 09/01/2019   ALT 29 09/15/2020   AST 64 (H) 09/15/2020   NA 144 09/17/2020   K 3.9 09/17/2020   CL 106 09/17/2020   CREATININE 1.45 (H) 09/17/2020   BUN 27 (H) 09/17/2020   CO2 28 09/17/2020   TSH 2.77 12/01/2019   INR 1.0 09/14/2020   HGBA1C 5.5 07/20/2020   MICROALBUR 5.9 (H) 10/01/2016      PT/INR: No results for input(s): LABPROT, INR in the last 72 hours. Radiology: [facility] Chest Port 1 View  Result Date: 09/17/2020 CLINICAL DATA:  Shortness of breath.  Recent aortic dissection EXAM: PORTABLE CHEST 1 VIEW COMPARISON:  September 16, 2020 FINDINGS: Cordis tip is in the superior vena cava. No pneumothorax. There is a persistent right pleural effusion. There is airspace consolidation in the medial lung base regions. Apparent previous small left pleural effusion no longer evident. There is cardiomegaly with pulmonary vascularity within normal limits. Mediastinal prominence may be consistent with known aneurysm in recent surgery. Appearance stable. No adenopathy evident. No bone lesions. IMPRESSION: Central catheter position unchanged without pneumothorax. Stable cardiac silhouette. Right pleural effusion noted. Bibasilar consolidation, probably largely due to atelectasis with potential superimposed pneumonia in these areas. Previously noted left pleural effusion no longer evident. Electronically Signed   By: Lowella Grip III M.D.   On: 09/17/2020 08:04   COOX 62.3  Assessment/Plan: S/P Procedure(s)  (LRB): THORACIC ASCENDING ANEURYSM REPAIR (AAA) USING HEMASHIELD PLATINUM 32MM x 30CM WOVEN DOUBLE VELOUR GRAFT (N/A) Mobilize Diuresis Place PICC line for IV access and so central line from the OR can be removed, continue low-dose dopamine and milrinone Patient is making progress on diuresis, left lung is clearing compared to 2 days ago-continue Lasix drip Start working with physical therapy as she can tolerate Still has increased oxygen demands but less than yesterday On lower dose Lovenox daily for DVT prophylaxis  [name] 09/17/2020 8:15 AM

## 2020-09-17 NOTE — Op Note (Signed)
NAME: Danielle Atkins, TOPOR MEDICAL RECORD FW:[id number] ACCOUNT [id number] DATE OF BIRTH:[birth year] FACILITY: [facility] LOCATION: [facility] PHYSICIAN:Calah Gershman B. Servando Snare, MD  OPERATIVE REPORT  DATE OF PROCEDURE:  09/13/2020  PREOPERATIVE DIAGNOSIS:  Acute type 1 aortic dissection of at least 24 hours duration.  POSTOPERATIVE DIAGNOSIS:  Acute type 1 aortic dissection of at least 24 hours duration.  SURGICAL PROCEDURE:  Emergency repair of type 1 aortic dissection with replacement of ascending aorta with 32 mm Hemashield graft with resuspension of the aortic valve with circulatory arrest, hypothermic.  SURGEON:  Lanelle Bal, MD  FIRST ASSISTANT:  Lars Pinks, PA  BRIEF HISTORY:  The patient is an 85 year old female with no previous history of myocardial infarction.  Several years prior, she had had a TIA associated with slurred speech.  The day prior to surgery, she came to the [facility] emergency room, spent 9  hours, and was never adequately evaluated.  She became frustrated knowing that she already had an appointment with her primary care doctor made 3 months before for thew following morning so left.  She originally presented to the emergency room because of dizziness and chest discomfort.   She went back home.  The next morning, still felt bad.  Went to see her primary care doctor approximately 9:00 in the morning and was sent back to the emergency services at [facility].  The patient then spent the remainder of the day until  approximately 6:30 in the evening when the surgical service was notified that diagnosis of type 1 aortic dissection had been made.  Talking to the referring emergency room      doctor, the patient had been anuric all day, creatinine was elevated to 2.2.   She was transferred urgently to the operating room holding room at [facility].  Was examined.  CT scans were reviewed.  The patient was awake, neurologically intact, and was able to relate her history  in good detail.  She had palpable femoral pulses  and bilateral palpable radial pulses.  Her speech was intact.  After describing to her the lethal nature of the current situation she agreed to proceed with emergency surgery.  Her daughter was in the hospital and this was also explained to her.  DESCRIPTION OF PROCEDURE:  The patient was quickly transferred to the operating room in supine position after Suann Larry placed bilateral arterial lines and a Swan-Ganz catheter.  The patient's blood pressure was fluctuating, but was maintaining  at least 90 systolic.  She was intubated.  TEE probe was placed, which again confirmed evidence of at least moderate to severe aortic insufficiency with the dissection appearing to start just at the commissure between the left and right coronary cusp.   Leaflet was a 3 leaflet aortic valve.  She also had moderate tricuspid regurgitation.  Overall, ventricular function appeared preserved.  The skin of the chest and legs was prepped with Betadine, draped in the usual sterile manner.  Appropriate timeout  was performed.  We first proceeded with isolating the right axillary artery.  Incision was made in the sternum, pecto clavicular groove.  The muscle fibers of the pectoralis major were separated.  The pectoralis muscle was divided.  This gave access to a  reasonably sized right axillary artery that did not appear to be dissected on visual examination or by CT.  Vessel loops were placed around the vessel.  The patient was systemically heparinized.  Clamps were placed on the axillary artery.  Vessel was  opened and  an 8 mm Hemashield graft was sewed with a running 6-0 Prolene to the axillary artery.  This graft was de-aired and attached to a connection site and attached to the arterial line.  A median sternotomy was performed.  Pericardium was opened and  as suggested by TEE and CT scan, the patient had an obvious type 1 aortic dissection involving the ascending aorta,  but without frank rupture of the aorta.  There was a moderate sized pericardial effusion, but this was not bloody.  Retrograde  cardioplegic catheter was placed.  Dual stage venous cannula was placed.  The patient was then placed on cardiopulmonary bypass 2.4 liters per minute per meter square.  A right superior pulmonary vein vent was placed.  We then cooled the patient's body  temperature down to approximately 20 degrees.  The innominate artery was identified in the dissection.  Cerebral oximetry was used.  With the body temperature cooled to 20 degrees we then began circulatory arrest.  A vascular clamp was placed at the  takeoff of the innominate artery and antegrade cerebral perfusion was started.  We then placed a coronary sucker and the aorta was opened.  Ascending aorta was transected in the midportion.  We then dissected towards the takeoff of the innominate artery.   It appeared that the primary tear of the dissection was just above the left coronary ostium.  The aortic valve was trileaflet.  The leaflets were intact.  The ostium of the right coronary artery was also intact.  We then first turned our attention to  distal anastomosis.  The aorta was sized for a 32 mm graft was selected, a Hemashield graft, straight in nature.  Felt strips were placed inside the lumen and also outside with baseball stitch.  Securing the edges together was performed.  The distal  anastomosis was then performed with a running 3-0 SH1 to the graft.  As this was completed, we allowed the graft and aorta to refill slowly and deair.  During the period of circulatory arrest there was significant backflow from the left carotid origin.   As the graft and aorta deaired we then placed a clamp across the graft.  The clamp that had been on the innominate artery had been removed and we resumed full circulatory support.  We then turned our attention to the aortic valve.  The 3 pledgeted  sutures were placed at the high point of  each commissure reapproximating the intima and adventitia and resuspending the leaflets.  We then in a similar fashion, placed a felt strip on the inside of the aorta and a 2nd one on the outside and using a  horizontal mattress suture, sandwiched the aorta between the felt strips.  The Hemashield graft was then trimmed to the appropriate length, slightly tapered and then the proximal anastomosis was performed with a running 3-0 Prolene suture in a continuous  manner.  During the procedure, we used intermittent retrograde cold cardioplegia.  As we completed the proximal anastomosis the heart was allowed to fill and deair.  Warm retrograde cardioplegia was administered, approximately 200 mL, and then the  crossclamp was removed with a total crossclamp time of 132 minutes.  Circulatory arrest time of 47 minutes with the majority of this with antegrade cerebral perfusion.  The patient spontaneously converted to a sinus rhythm.  Atrial and ventricular pacing  wires were placed to increase rate.  Retrograde cardioplegic catheter was removed.  The patient's body temperature was rewarmed to 37 degrees.  TEE images  were difficult to obtain.  The patient's LV function appeared adequate and preserved in all  segments with approximately 50% ejection fraction.  The patient had trace to mild central aortic regurgitation.  With the patient's body temperature rewarmed to 37 degrees she was then ventilated.  The right superior pulmonary vein vent was removed.  She  was then ventilated and weaned from cardiopulmonary bypass on low dose dopamine and milrinone infusion.  She did have significant coagulopathy.  The venous cannula was removed.  Protamine was administered.  The graft sewed to the axillary artery was  ligated with a vascular stapler.  We then administered blood products including cryoprecipitate, fresh frozen, and platelets and ultimately factor VII.  With this, we were ultimately able to have the operative field  hemostatic.  Two Blake drains were  left in the pericardium.  Anterior pericardium was closed over the ascending aorta.  The sternum was then closed with #6 stainless steel wire.  Fascia closed with interrupted 2-0 Vicryl, running 3-0 Vicryl subcutaneous tissue, 3-0 subcuticular stitch in  skin edges.  The area of the right axillary cannulation was hemostatic with the staple on the vascular stump of the graft.  The pectoralis major muscle fibers were reapproximated with interrupted 0 Vicryl, and 3-0 Vicryl in the subcutaneous tissue, with  3-0 subcuticular stitch in skin edges.  Dry dressings were applied.  The patient was then transferred to the surgical intensive care unit, intubated for further postoperative care.  Sponge and needle count was reported as correct at completion of  procedure.  Total pump time was 200 minutes.  The patient did require both platelets, fresh frozen, packed red blood cells, cryoprecipitate, and factor VII to correct her coagulopathy.  At the end of the case, the chest tube drainage was minimal.  CN/NUANCE  D:09/16/2020 T:09/17/2020 JOB:[id number]

## 2020-09-17 NOTE — Progress Notes (Signed)
EVENING ROUNDS NOTE :     [address]       [place] [address]             [phone]                 4 Days Post-Op Procedure(s) (LRB): THORACIC ASCENDING ANEURYSM REPAIR (AAA) USING HEMASHIELD PLATINUM 32MM x 30CM WOVEN DOUBLE VELOUR GRAFT (N/A)   Total Length of Stay:  LOS: 4 days  Events:   No events Resting on bipap    BP 120/72   Pulse 89   Temp 98.9 F (37.2 C) (Axillary)   Resp (!) 21   Ht 5' 4.5" (1.638 m)   Wt 81.1 kg   SpO2 99%   BMI 30.22 kg/m   CVP:  [11 mmHg-16 mmHg] 16 mmHg  Vent Mode: BIPAP FiO2 (%):  [60 %-80 %] 60 % Set Rate:  [8 bmp] 8 bmp PEEP:  [7 cmH20] 7 cmH20  . sodium chloride Stopped (09/15/20 1756)  . sodium chloride Stopped (09/14/20 0800)  . sodium chloride 10 mL/hr at 09/17/20 1617  . DOPamine 2 mcg/kg/min (09/17/20 1700)  . furosemide (LASIX) infusion 8 mg/hr (09/17/20 1700)  . lactated ringers Stopped (09/15/20 1905)  . milrinone 0.125 mcg/kg/min (09/17/20 1700)  . nitroGLYCERIN Stopped (09/17/20 1518)    I/O last 3 completed shifts: In: 1391.6 [P.O.:558; I.V.:421.3; IV Piggyback:412.4] Out: 3500 [Urine:3500]   CBC Latest Ref Rng & Units 09/16/2020 09/15/2020 09/15/2020  WBC 4.0 - 10.5 K/uL 14.3(H) - 13.0(H)  Hemoglobin 12.0 - 15.0 g/dL 9.1(L) 9.9(L) 9.6(L)  Hematocrit 36 - 46 % 29.4(L) 29.0(L) 29.6(L)  Platelets 150 - 400 K/uL 104(L) - 94(L)    BMP Latest Ref Rng & Units 09/17/2020 09/16/2020 09/16/2020  Glucose 70 - 99 mg/dL 125(H) 117(H) 166(H)  BUN 8 - 23 mg/dL 27(H) 27(H) 31(H)  Creatinine 0.44 - 1.00 mg/dL 1.45(H) 1.43(H) 1.56(H)  Sodium 135 - 145 mmol/L 144 143 142  Potassium 3.5 - 5.1 mmol/L 3.9 4.8 3.8  Chloride 98 - 111 mmol/L 106 108 106  CO2 22 - 32 mmol/L 28 27 25   Calcium 8.9 - 10.3 mg/dL 7.8(L) 7.5(L) 7.9(L)    ABG    Component Value Date/Time   PHART 7.478 (H) 09/15/2020 2136   PCO2ART 36.0 09/15/2020 2136   PO2ART 58 (L) 09/15/2020 2136   HCO3 26.8 09/15/2020 2136   TCO2 28  09/15/2020 2136   ACIDBASEDEF 2.0 09/14/2020 0048   O2SAT 62.3 09/17/2020 0448       Melodie Bouillon, MD 09/17/2020 5:46 PM

## 2020-09-18 ENCOUNTER — Inpatient Hospital Stay (HOSPITAL_COMMUNITY): Payer: Medicare HMO

## 2020-09-18 ENCOUNTER — Encounter (HOSPITAL_COMMUNITY): Payer: Self-pay | Admitting: Cardiothoracic Surgery

## 2020-09-18 DIAGNOSIS — R638 Other symptoms and signs concerning food and fluid intake: Secondary | ICD-10-CM | POA: Diagnosis not present

## 2020-09-18 DIAGNOSIS — I312 Hemopericardium, not elsewhere classified: Secondary | ICD-10-CM

## 2020-09-18 DIAGNOSIS — J90 Pleural effusion, not elsewhere classified: Secondary | ICD-10-CM | POA: Diagnosis not present

## 2020-09-18 DIAGNOSIS — J9601 Acute respiratory failure with hypoxia: Secondary | ICD-10-CM | POA: Diagnosis not present

## 2020-09-18 DIAGNOSIS — R570 Cardiogenic shock: Secondary | ICD-10-CM | POA: Diagnosis not present

## 2020-09-18 DIAGNOSIS — J810 Acute pulmonary edema: Secondary | ICD-10-CM | POA: Diagnosis not present

## 2020-09-18 DIAGNOSIS — I7103 Dissection of thoracoabdominal aorta: Secondary | ICD-10-CM | POA: Diagnosis not present

## 2020-09-18 DIAGNOSIS — I7101 Dissection of thoracic aorta: Secondary | ICD-10-CM | POA: Diagnosis not present

## 2020-09-18 LAB — BASIC METABOLIC PANEL
Anion gap: 11 (ref 5–15)
BUN: 27 mg/dL — ABNORMAL HIGH (ref 8–23)
CO2: 27 mmol/L (ref 22–32)
Calcium: 8.3 mg/dL — ABNORMAL LOW (ref 8.9–10.3)
Chloride: 104 mmol/L (ref 98–111)
Creatinine, Ser: 1.38 mg/dL — ABNORMAL HIGH (ref 0.44–1.00)
GFR calc non Af Amer: 35 mL/min — ABNORMAL LOW (ref >60)
Glucose, Bld: 178 mg/dL — ABNORMAL HIGH (ref 70–99)
Potassium: 3.5 mmol/L (ref 3.5–5.1)
Sodium: 142 mmol/L (ref 135–145)

## 2020-09-18 LAB — CULTURE, BLOOD (ROUTINE X 2)
Culture: NO GROWTH
Culture: NO GROWTH
Special Requests: ADEQUATE
Special Requests: ADEQUATE

## 2020-09-18 LAB — COMPREHENSIVE METABOLIC PANEL
ALT: 40 U/L (ref 0–44)
AST: 79 U/L — ABNORMAL HIGH (ref 15–41)
Albumin: 2.5 g/dL — ABNORMAL LOW (ref 3.5–5.0)
Alkaline Phosphatase: 61 U/L (ref 38–126)
Anion gap: 11 (ref 5–15)
BUN: 26 mg/dL — ABNORMAL HIGH (ref 8–23)
CO2: 29 mmol/L (ref 22–32)
Calcium: 7.8 mg/dL — ABNORMAL LOW (ref 8.9–10.3)
Chloride: 103 mmol/L (ref 98–111)
Creatinine, Ser: 1.31 mg/dL — ABNORMAL HIGH (ref 0.44–1.00)
GFR calc Af Amer: 44 mL/min — ABNORMAL LOW (ref >60)
GFR calc non Af Amer: 38 mL/min — ABNORMAL LOW (ref >60)
Glucose, Bld: 138 mg/dL — ABNORMAL HIGH (ref 70–99)
Potassium: 3.3 mmol/L — ABNORMAL LOW (ref 3.5–5.1)
Sodium: 143 mmol/L (ref 135–145)
Total Bilirubin: 1.4 mg/dL — ABNORMAL HIGH (ref 0.3–1.2)
Total Protein: 6.1 g/dL — ABNORMAL LOW (ref 6.5–8.1)

## 2020-09-18 LAB — CBC
HCT: 27.4 % — ABNORMAL LOW (ref 36.0–46.0)
Hemoglobin: 8.6 g/dL — ABNORMAL LOW (ref 12.0–15.0)
MCH: 23.5 pg — ABNORMAL LOW (ref 26.0–34.0)
MCHC: 31.4 g/dL (ref 30.0–36.0)
MCV: 74.9 fL — ABNORMAL LOW (ref 80.0–100.0)
Platelets: 173 10*3/uL (ref 150–400)
RBC: 3.66 MIL/uL — ABNORMAL LOW (ref 3.87–5.11)
RDW: 21.7 % — ABNORMAL HIGH (ref 11.5–15.5)
WBC: 12.2 10*3/uL — ABNORMAL HIGH (ref 4.0–10.5)
nRBC: 0.5 % — ABNORMAL HIGH (ref 0.0–0.2)

## 2020-09-18 LAB — COOXEMETRY PANEL
Carboxyhemoglobin: 1.2 % (ref 0.5–1.5)
Methemoglobin: 0.7 % (ref 0.0–1.5)
O2 Saturation: 75.2 %
Total hemoglobin: 8.8 g/dL — ABNORMAL LOW (ref 12.0–16.0)

## 2020-09-18 LAB — GLUCOSE, CAPILLARY
Glucose-Capillary: 133 mg/dL — ABNORMAL HIGH (ref 70–99)
Glucose-Capillary: 99 mg/dL (ref 70–99)
Glucose-Capillary: 93 mg/dL (ref 70–99)
Glucose-Capillary: 114 mg/dL — ABNORMAL HIGH (ref 70–99)
Glucose-Capillary: 152 mg/dL — ABNORMAL HIGH (ref 70–99)
Glucose-Capillary: 137 mg/dL — ABNORMAL HIGH (ref 70–99)

## 2020-09-18 MED ORDER — POTASSIUM CHLORIDE CRYS ER 20 MEQ PO TBCR
40.0000 meq | EXTENDED_RELEASE_TABLET | Freq: Two times a day (BID) | ORAL | Status: AC
  Administered 2020-09-18 – 2020-09-19 (×2): 40 meq via ORAL
  Filled 2020-09-18 (×2): qty 2

## 2020-09-18 MED ORDER — POTASSIUM CHLORIDE 10 MEQ/50ML IV SOLN
10.0000 meq | INTRAVENOUS | Status: AC
  Administered 2020-09-18 (×4): 10 meq via INTRAVENOUS
  Filled 2020-09-18 (×4): qty 50

## 2020-09-18 MED ORDER — POTASSIUM CHLORIDE 20 MEQ/15ML (10%) PO SOLN
20.0000 meq | Freq: Once | ORAL | Status: AC
  Administered 2020-09-18: 20 meq via ORAL
  Filled 2020-09-18: qty 15

## 2020-09-18 MED ORDER — POTASSIUM CHLORIDE CRYS ER 20 MEQ PO TBCR: 20.0000 meq | EXTENDED_RELEASE_TABLET | ORAL | Status: DC

## 2020-09-18 MED ORDER — POTASSIUM CHLORIDE 20 MEQ PO PACK: 40.0000 meq | PACK | Freq: Once | ORAL | Status: DC

## 2020-09-18 MED ORDER — BOOST / RESOURCE BREEZE PO LIQD CUSTOM
1.0000 | Freq: Three times a day (TID) | ORAL | Status: DC
  Administered 2020-09-18 – 2020-09-19 (×3): 1 via ORAL

## 2020-09-18 NOTE — Progress Notes (Signed)
Patient ID: Danielle Atkins, female   DOB: 1935/09/22, 85 y.o.   MRN: [id number] TCTS DAILY [name] PROGRESS NOTE                    [address]            ,[place] [address]          [phone]   5 Days Post-Op Procedure(s) (LRB): THORACIC ASCENDING ANEURYSM REPAIR (AAA) USING HEMASHIELD PLATINUM 32MM x 30CM WOVEN DOUBLE VELOUR GRAFT (N/A)  Total Length of Stay:  LOS: 5 days   Subjective: , Patient awake alert sitting in chair, overall much more alert talkative and comfortable breathing this morning compared to yesterday  Objective: Vital signs in last 24 hours: Temp:  [98.1 F (36.7 C)-99.1 F (37.3 C)] 98.4 F (36.9 C) (10/05 0400) Pulse Rate:  [86-108] 92 (10/05 0000) Cardiac Rhythm: Sinus tachycardia (10/05 0000) Resp:  [19-29] 27 (10/05 0000) BP: (81-137)/(34-85) 113/68 (10/05 0000) SpO2:  [87 %-100 %] 90 % (10/05 0000) FiO2 (%):  [60 %-80 %] 60 % (10/04 2011) Weight:  [78.3 kg] 78.3 kg (10/05 0500)  Filed Weights   09/16/20 0500 09/17/20 0615 09/18/20 0500  Weight: 88.5 kg 81.1 kg 78.3 kg    Weight change: -2.8 kg   Hemodynamic parameters for last 24 hours: CVP:  [16 mmHg] 16 mmHg  Intake/Output from previous day: 10/04 0701 - 10/05 0700 In: 332.2 [I.V.:232.2; IV Piggyback:100] Out: 3350 [Urine:3350]  Intake/Output this shift: Total I/O In: 98.4 [I.V.:98.4] Out: 2000 [Urine:2000]  Current Meds: Scheduled Meds: . acetaminophen  1,000 mg Oral Q6H   Or  . acetaminophen (TYLENOL) oral liquid 160 mg/5 mL  1,000 mg Per Tube Q6H  . allopurinol  300 mg Oral Daily  . aspirin EC  325 mg Oral Daily   Or  . aspirin  324 mg Per Tube Daily  . bisacodyl  10 mg Oral Daily   Or  . bisacodyl  10 mg Rectal Daily  . chlorhexidine gluconate (MEDLINE KIT)  15 mL Mouth Rinse BID  . Chlorhexidine Gluconate Cloth  6 each Topical Daily  . docusate sodium  200 mg Oral Daily  . enoxaparin (LOVENOX) injection  30 mg Subcutaneous Q24H  . influenza vaccine  adjuvanted  0.5 mL Intramuscular Tomorrow-1000  . insulin aspart  0-24 Units Subcutaneous Q4H  . latanoprost  1 drop Both Eyes QHS  . levothyroxine  25 mcg Oral q AM  . pantoprazole  40 mg Oral Daily  . pneumococcal 23 valent vaccine  0.5 mL Intramuscular Tomorrow-1000  . simvastatin  40 mg Oral q1800  . sodium chloride flush  10-40 mL Intracatheter Q12H  . sodium chloride flush  3 mL Intravenous Q12H   Continuous Infusions: . sodium chloride Stopped (09/15/20 1756)  . sodium chloride Stopped (09/14/20 0800)  . sodium chloride 10 mL/hr at 09/17/20 1617  . DOPamine 2 mcg/kg/min (09/18/20 0000)  . furosemide (LASIX) infusion 8 mg/hr (09/18/20 0000)  . lactated ringers Stopped (09/15/20 1905)  . milrinone 0.125 mcg/kg/min (09/18/20 0000)  . nitroGLYCERIN Stopped (09/17/20 1518)  . potassium chloride 10 mEq (09/18/20 0619)   PRN Meds:.sodium chloride, dextrose, levalbuterol, lip balm, metoprolol tartrate, ondansetron (ZOFRAN) IV, oxyCODONE, sodium chloride flush, traMADol  General appearance: alert and cooperative Neurologic: intact Heart: regular rate and rhythm, S1, S2 normal, no murmur, click, rub or gallop Lungs: diminished breath sounds bibasilar Abdomen: soft, non-tender; bowel sounds normal; no masses,  no organomegaly Extremities: Decreased peripheral  edema especially right hand Wound: Sternum stable  Lab Results: CBC: Recent Labs    09/16/20 0335 09/18/20 0405  WBC 14.3* 12.2*  HGB 9.1* 8.6*  HCT 29.4* 27.4*  PLT 104* 173   BMET:  Recent Labs    09/17/20 0448 09/18/20 0405  NA 144 143  K 3.9 3.3*  CL 106 103  CO2 28 29  GLUCOSE 125* 138*  BUN 27* 26*  CREATININE 1.45* 1.31*  CALCIUM 7.8* 7.8*    CMET: Lab Results  Component Value Date   WBC 12.2 (H) 09/18/2020   HGB 8.6 (L) 09/18/2020   HCT 27.4 (L) 09/18/2020   PLT 173 09/18/2020   GLUCOSE 138 (H) 09/18/2020   CHOL 132 09/01/2019   TRIG 103.0 09/01/2019   HDL 42.20 09/01/2019   LDLCALC 70  09/01/2019   ALT 40 09/18/2020   AST 79 (H) 09/18/2020   NA 143 09/18/2020   K 3.3 (L) 09/18/2020   CL 103 09/18/2020   CREATININE 1.31 (H) 09/18/2020   BUN 26 (H) 09/18/2020   CO2 29 09/18/2020   TSH 2.77 12/01/2019   INR 1.0 09/14/2020   HGBA1C 5.8 (H) 09/14/2020   MICROALBUR 5.9 (H) 10/01/2016      PT/INR: No results for input(s): LABPROT, INR in the last 72 hours. Radiology: [place] EKG SITE RITE  Result Date: 09/17/2020 If Site Rite image not attached, placement could not be confirmed due to current cardiac rhythm.  Chest x-ray this morning not read yet but improved from yesterday, still with bilateral effusions right greater than left but decreased  Assessment/Plan: S/P Procedure(s) (LRB): THORACIC ASCENDING ANEURYSM REPAIR (AAA) USING HEMASHIELD PLATINUM 32MM x 30CM WOVEN DOUBLE VELOUR GRAFT (N/A) Mobilize Diuresis Diabetes control Start working with physical therapy for mobility Continue diuresis  [name] 09/18/2020 6:53 AM

## 2020-09-18 NOTE — Progress Notes (Signed)
RT went to place pt on bipap for the night, pt resting comfortably, vital signs stable. No need for bipap at this time but is on stand by in room. RN aware.

## 2020-09-18 NOTE — Progress Notes (Signed)
Patient ID: Danielle Atkins, female   DOB: 1935-11-02, 85 y.o.   MRN: [id number] EVENING ROUNDS NOTE :     [address]       [place] [address]             [phone]                 5 Days Post-Op Procedure(s) (LRB): THORACIC ASCENDING ANEURYSM REPAIR (AAA) USING HEMASHIELD PLATINUM 32MM x 30CM WOVEN DOUBLE VELOUR GRAFT (N/A)  Total Length of Stay:  LOS: 5 days  BP 128/74   Pulse 90   Temp 98 F (36.7 C) (Oral)   Resp (!) 28   Ht 5' 4.5" (1.638 m)   Wt 78.3 kg   SpO2 93%   BMI 29.17 kg/m   .Intake/Output      10/04 0701 - 10/05 0700 10/05 0701 - 10/06 0700   P.O.  420   I.V. (mL/kg) 321.6 (4.1) 86.9 (1.1)   IV Piggyback 134 166.1   Total Intake(mL/kg) 455.5 (5.8) 673 (8.6)   Urine (mL/kg/hr) 3350 (1.8) 670 (0.8)   Stool  0   Total Output 3350 670   Net -2894.5 +3        Stool Occurrence  2 x     . sodium chloride Stopped (09/15/20 1756)  . sodium chloride Stopped (09/14/20 0800)  . sodium chloride 10 mL/hr at 09/17/20 1617  . furosemide (LASIX) infusion 6 mg/hr (09/18/20 1600)  . lactated ringers Stopped (09/15/20 1905)  . milrinone 0.125 mcg/kg/min (09/18/20 1725)     Lab Results  Component Value Date   WBC 12.2 (H) 09/18/2020   HGB 8.6 (L) 09/18/2020   HCT 27.4 (L) 09/18/2020   PLT 173 09/18/2020   GLUCOSE 178 (H) 09/18/2020   CHOL 132 09/01/2019   TRIG 103.0 09/01/2019   HDL 42.20 09/01/2019   LDLCALC 70 09/01/2019   ALT 40 09/18/2020   AST 79 (H) 09/18/2020   NA 142 09/18/2020   K 3.5 09/18/2020   CL 104 09/18/2020   CREATININE 1.38 (H) 09/18/2020   BUN 27 (H) 09/18/2020   CO2 27 09/18/2020   TSH 2.77 12/01/2019   INR 1.0 09/14/2020   HGBA1C 5.8 (H) 09/14/2020   MICROALBUR 5.9 (H) 10/01/2016   Stable today Walked in hall short distance   [name] MD  Beeper [phone] Office [phone] 09/18/2020 5:57 PM

## 2020-09-18 NOTE — Progress Notes (Signed)
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 She was emergently transferred to [facility] for TCTS evaluation. She underwent an aortic graft repair on 9/30 and was transferred to the [name] intubated. She self-extubated on 10/2 and has had progressively increasing oxygen requirements since. She now requires BiPAP and has been unable to take breaks without desaturations. She denies other complaints currently. Her son is at bedside who has provided some of the history. Per her daughter, she is active at baseline, but has been walking with her friends less over the past 6 months. She has an albuterol inhaler at home that she uses infrequently, but no known lung disease. She has had more SOB for the past ~6 months, but is unsure why. She quit smoking about 25 years ago and was never a heavy smoker.   Past Medical History  HLD HTN CKD2 NIDDM- PTA metformin Gout  Significant Hospital Events     Consults:  PCCM TCTS  Procedures:  Aortic dissection repair trialysis placement  Significant Diagnostic Tests:  TEE LVEF 50-55%, dilated RV  Micro Data:  9/30 urine> NG 9/30 blood> NG  Antimicrobials:  vanc 9/30 -10/1 Cefuroxime 9/30> 10/2  Interim history/subjective:   K 3.3 - replacing Cr 1.31 (decr from 1.45)  SvO2 62.3 Off nitro Lasix gtt at 6 Off dopamine Milrinone at 0.125   On HFNC, off NRB  Worked with PT this morning   Objective   Blood pressure 124/75, pulse  86, temperature 98.4 F (36.9 C), temperature source Axillary, resp. rate (!) 29, height 5' 4.5" (1.638 m), weight 78.3 kg, SpO2 91 %.    Vent Mode: BIPAP FiO2 (%):  [60 %-80 %] 60 % Set Rate:  [8 bmp] 8 bmp PEEP:  [7 cmH20] 7 cmH20   Intake/Output Summary (Last 24 hours) at 09/18/2020 1015 Last data filed at 09/18/2020 1000 Gross per 24 hour  Intake 548.38 ml  Output 3075 ml  Net -2526.62 ml   Filed Weights   09/16/20 0500 09/17/20 0615 09/18/20 0500  Weight: 88.5 kg 81.1 kg 78.3 kg    Examination: General: Tired appearing elderly F, reclined in bed NAD on HFNC  HENT: NCAT anicteric sclera pink mmm. Trachea midline  Lungs: Scattered rales, decreased from prior. Diminished bibasilar sounds. Even, unlabored respirations on HFNC  Cardiovascular: RRR s1s2 cap refill < 3 seconds 2+ periph pulses  Abdomen: Soft round ndnt + bowel sounds, + flatus  Extremities: Non-pitting BUE edema. No BLE edema. No obvious joint deformity.   Neuro: AAOx3 following commands. PERRL.  Skin: c/d/w. No rash. Surgical site dressing c/d/i   Resolved Hospital Problem list     Assessment & Plan:   Acute hypoxic respiratory failure, improving - due to acute pulmonary edema, bilateral pleural effusions, ? Atelectasis  P --Off NRB and tolerating HFNC well  -- continue diuresis, IS, mobility efforts --if deteriorates, low threshold for re-intubation   Type I aortic dissection,  status post graft repair -Management per primary. -Continue aspirin and statin  Shock postoperatively -likely cardiogenic -off nitro, off dopamine. 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 Lasix incr to 8 ml/hr 10/4 afternoon. Changed to 37ml/hr 10/5 P -Strict  I's/O -Renally dose medications and avoid nephrotoxic meds -Diuresis for goal euvolemia-- Lasix at 72ml/hr  Inadequate PO intake -kept NPO 10/4 with risk of reintubation -improving from O2 support standpoint, will ask SLP to eval -if unable to take POs, rec cortrak 10/6 with EN per RDN recs  Best practice:  Diet: NPO -- hoping to advance 10/5 Pain/Anxiety/Delirium protocol (if indicated): n/a VAP protocol (if indicated): n/a DVT prophylaxis: Lovenox GI prophylaxis: Pantoprazole Glucose control: SSI Mobility: Bedrest Code Status: Full Family Communication: patient updated 10/5. Family communication per primary Disposition: [name]  Labs   CBC: Recent Labs  Lab 09/13/20 1129 09/13/20 1913 09/14/20 1954 09/14/20 2221 09/15/20 0323 09/15/20 0539 09/15/20 0749 09/15/20 1705 09/15/20 2136 09/16/20 0335 09/18/20 0405  WBC 16.8*   < > 8.9  --  10.9*  --   --  13.0*  --  14.3* 12.2*  NEUTROABS 13.5*  --   --   --   --   --   --   --   --   --   --   HGB 11.2*   < > 9.1*   < > 9.5*   < > 10.2* 9.6* 9.9* 9.1* 8.6*  HCT 36.7   < > 28.1*   < > 28.6*   < > 30.0* 29.6* 29.0* 29.4* 27.4*  MCV 69.4*   < > 71.5*  --  72.4*  --   --  73.4*  --  74.6* 74.9*  PLT 109*   < > 76*  --  81*  --   --  94*  --  104* 173   < > = values in this interval not displayed.    Basic Metabolic Panel: Recent Labs  Lab 09/14/20 0350 09/14/20 0539 09/14/20 0831 09/14/20 1112 09/15/20 1705 09/15/20 1705 09/15/20 2136 09/16/20 0335 09/16/20 2225 09/17/20 0448 09/18/20 0405  NA 143   < > 141   < > 141   < > 144 142 143 144 143  K 4.1   < > 4.0   < > 4.2   < > 4.0 3.8 4.8 3.9 3.3*  CL 107   < > 106   < > 108  --   --  106 108 106 103  CO2 24   < > 24   < > 22  --   --  25 27 28 29   GLUCOSE 197*   < > 213*   < > 129*  --   --  166* 117* 125* 138*  BUN 23   < > 25*   < > 29*  --   --  31* 27* 27* 26*  CREATININE 1.41*   < > 1.66*   < > 1.57*  --   --  1.56* 1.43* 1.45* 1.31*  CALCIUM 7.6*   < > 7.9*    < > 7.8*  --   --  7.9* 7.5* 7.8* 7.8*  MG 1.4*  --  2.4  --  2.1  --   --   --  2.0  --   --    < > = values in this interval not displayed.   GFR: Estimated Creatinine Clearance: 33.3 mL/min (A) (by C-G formula based on SCr of 1.31 mg/dL (H)). Recent Labs  Lab 09/13/20 1129 09/13/20 1354 09/14/20 0350 09/15/20 0323 09/15/20 1705 09/16/20 0335 09/18/20 0405  WBC 16.8*  --    < > 10.9* 13.0* 14.3* 12.2*  LATICACIDVEN 2.2* 1.2  --   --   --   --   --    < > = values in this interval not displayed.    Liver Function Tests: Recent Labs  Lab 09/13/20 1129 09/15/20 0323 09/18/20 0405  AST 44* 64* 79*  ALT 20 29 40  ALKPHOS 45 38 61  BILITOT 2.2* 3.5* 1.4*  PROT 8.5* 5.6* 6.1*  ALBUMIN 3.8 2.9* 2.5*   No results for input(s): LIPASE, AMYLASE in the last 168 hours. No results for input(s): AMMONIA in the last 168 hours.  ABG    Component Value Date/Time   PHART 7.478 (H) 09/15/2020 2136   PCO2ART 36.0 09/15/2020 2136   PO2ART 58 (L) 09/15/2020 2136   HCO3 26.8 09/15/2020 2136   TCO2 28 09/15/2020 2136   ACIDBASEDEF 2.0 09/14/2020 0048   O2SAT 75.2 09/18/2020 0349     Coagulation Profile: Recent Labs  Lab 09/14/20 0350  INR 1.0    Cardiac Enzymes: No results for input(s): CKTOTAL, CKMB, CKMBINDEX, TROPONINI in the last 168 hours.  HbA1C: Hemoglobin A1C  Date/Time Value Ref Range Status  07/20/2020 09:40 AM 5.5 4.0 - 5.6 % Final  12/01/2019 07:07 AM 5.5 4.0 - 5.6 % Final   Hgb A1c MFr Bld  Date/Time Value Ref Range Status  09/14/2020 03:50 AM 5.8 (H) 4.8 - 5.6 % Final    Comment:    (NOTE)         Prediabetes: 5.7 - 6.4         Diabetes: >6.4         Glycemic control for adults with diabetes: <7.0   09/01/2019 07:38 AM 6.5 4.6 - 6.5 % Final    Comment:    Glycemic Control Guidelines for People with Diabetes:Non Diabetic:  <6%Goal of Therapy: <7%Additional Action Suggested:  >8%     CBG: Recent Labs  Lab 09/17/20 1209 09/17/20 1531  09/17/20 2015 09/18/20 0022 09/18/20 0401  GLUCAP 126* 115* 93 133* 99    CRITICAL CARE Performed by: Cristal Generous   Total critical care time: 38 minutes  Critical care time was exclusive of separately billable procedures and treating other patients. Critical care was necessary to treat or prevent imminent or life-threatening deterioration.  Critical care was time spent personally by me on the following activities: development of treatment plan with patient and/or surrogate as well as nursing, discussions with consultants, evaluation of patient's response to treatment, examination of patient, obtaining history from patient or surrogate, ordering and performing treatments and interventions, ordering and review of laboratory studies, ordering and review of radiographic studies, pulse oximetry and re-evaluation of patient's condition.Eliseo Gum MSN, AGACNP-BC [place] [phone] If no answer, [phone] 09/18/2020, 10:17 AM

## 2020-09-18 NOTE — Consult Note (Signed)
Cardiology Consultation:   Patient ID: Danielle Atkins MRN: [id number]; DOB: 28-Jul-1935  Admit date: 09/13/2020 Date of Consult: 09/18/2020  Primary Care Provider: [name], [name], MD [facility] Cardiologist: Pixie Casino, MD (new) [facility] Electrophysiologist:  None    Patient Profile:   Danielle Atkins is a 85 y.o. female with a hx of HTN, HLD, DM, prior TIA, gout, peptic ulcer disease, upper GIB, hypothyroidism, CKD stage III, RBBB, remote tobacco abuse who is being seen today for the evaluation of aortic dissection co-management at the request of Dr. Servando Snare.  History of Present Illness:   The patient initially went to UC on 09/10/20 having had an episode earlier in the day with dizziness, slurred speech and disorientation that lasted about 5-10 minutes. Subsequent to this she felt some burning in the bottom of her feet. She was sent to the ED for evaluation but appears she LWBS due to the prolonged wait time of 9 hours. She ultimately left because she knew she had an appointment coming back up with her primary care on 9/30. From that visit she was sent acutely back to the ED and she spent an additional 7 hours waiting for a diagnosis. She also did not make any urine while in the ED. Initial workup showed tachycardia, leukocytosis, AKI on CKD, mildly elevated troponin. Her pulse ox was also borderline. CT chest without contrast was obtained due to her renal insufficiency and this was suspicious for aortic dissection. Risk/benefit discussion ensued and she subsequently underwent CT angio chest/abd/pelvis showing type A thoracic aortic dissection extending into the mid right common iliac artery. The dissection flap also extended into the left subclavian artery, left axillary artery, left axillary branch, left common carotid artery with marked stenosis of its origin, into the origin of the celiac, superior mesenteric, and likely inferior mesenteric arteries. There  was complete devascularization of the right kidney due to the right renal artery originating from the excluded lumen. She went emergently to the OR and underwent median sternotomy with replacement of ascending aorta using a 32m Hemashield graft. Intra-op TEE pre-operatively showed EF 50-55% moderate-severe AI, moderate TR, mild MR, and moderate pericardial effusion with post-report showing moderate AI, similar tricuspid/mitral findings, and does not comment on pericardial effusion size.  She was intubated for surgery but self-extubated on 10/2. Post-op course notable for acute hypoxic respiratory failure due to acute pulmonary edema/bilateral pleural effusions requiring diuresis, likely cardiogenic shock postoperatively, AKI superimposed on CKD. Pulmonary critical care has been following.  She is seen today and is about to have her SLP evaluation. She appears mildly SOB. She reports though that she is feeling better today, with more energy, less SOB and increased appetite. Denies any significant pain. Her daughter [place] a [place]urance account manager[place]   Past Medical History:  Diagnosis Date  . ANEMIA-NOS 06/25/2007  . CKD (chronic kidney disease), stage III ([facility]   . Diabetes mellitus ([facility]   . Former tobacco use   . GOUT 11/16/2009  . HTN (hypertension)   . Hyperlipidemia   . PARESTHESIA, HANDS 03/27/2009  . PEPTIC ULCER DISEASE 06/25/2007  . RBBB   . TIA (transient ischemic attack)   . Upper GI bleed     Past Surgical History:  Procedure Laterality Date  . THORACIC AORTIC ANEURYSM REPAIR N/A 09/13/2020   Procedure: THORACIC ASCENDING ANEURYSM REPAIR (AAA) USING HEMASHIELD PLATINUM 32MM x 30CM WOVEN DOUBLE VELOUR GRAFT;  Surgeon: GGrace Isaac MD;  [facility]  Service: Open  Heart Surgery;  Laterality: N/A;     Home Medications:  Prior to Admission medications   Medication Sig Start Date End Date Taking? Authorizing Provider  albuterol (PROAIR HFA) 108 (90 Base) MCG/ACT inhaler  Inhale 2 puffs into the lungs every 6 (six) hours as needed for wheezing. 12/28/18  Yes [name], MD  allopurinol (ZYLOPRIM) 300 MG tablet TAKE 1 TABLET EVERY DAY Patient taking differently: Take 300 mg by mouth daily.  07/27/20  Yes [name], [name], MD  amLODipine (NORVASC) 5 MG tablet TAKE 1 TABLET EVERY DAY Patient taking differently: Take 5 mg by mouth daily.  07/31/20  Yes [name], [name], MD  Ascorbic Acid (VITAMIN C) 100 MG tablet Take 1,000 mg by mouth daily. Takes C Complex   Yes [provider]  aspirin 81 MG tablet Take 1 tablet (81 mg total) by mouth daily. Patient taking differently: Take 325 mg by mouth daily.  06/29/14  Yes [name], MD  cholecalciferol (VITAMIN D3) 25 MCG (1000 UT) tablet Take 5,000 Units by mouth daily.    Yes [provider]  famotidine (PEPCID) 40 MG tablet TAKE 1 TABLET EVERY DAY Patient taking differently: Take 40 mg by mouth daily.  06/21/20  Yes [name], [name], MD  latanoprost (XALATAN) 0.005 % ophthalmic solution 1 drop daily. 06/28/20  Yes [provider]  levothyroxine (SYNTHROID) 50 MCG tablet TAKE 1/2 TABLET EVERY DAY BEFORE BREAKFAST (NEED MD APPOINTMENT) Patient taking differently: Take 25 mcg by mouth daily before breakfast.  09/12/20  Yes [name], [name], MD  metFORMIN (GLUCOPHAGE) 500 MG tablet TAKE 1 TABLET EVERY DAY  WITH  BREAKFAST Patient taking differently: Take 500 mg by mouth daily with breakfast.  04/25/20  Yes [name], [name], MD  simvastatin (ZOCOR) 40 MG tablet TAKE 1 TABLET EVERY DAY Patient taking differently: Take 40 mg by mouth daily.  06/21/20  Yes [name], [name], MD  diclofenac (VOLTAREN) 50 MG EC tablet Take 1 tablet (50 mg total) by mouth 3 (three) times daily as needed. Take 1 tablet TID x 5 days then PRN for pain Patient not taking: Reported on 09/15/2020 09/22/19   Enrique Sack, FNP  ondansetron (ZOFRAN ODT) 4 MG  disintegrating tablet Take 1 tablet (4 mg total) by mouth every 8 (eight) hours as needed for nausea or vomiting. Patient not taking: Reported on 09/15/2020 10/27/19   Chase Picket, MD    Inpatient Medications: Scheduled Meds: . acetaminophen  1,000 mg Oral Q6H   Or  . acetaminophen (TYLENOL) oral liquid 160 mg/5 mL  1,000 mg Per Tube Q6H  . allopurinol  300 mg Oral Daily  . aspirin EC  325 mg Oral Daily   Or  . aspirin  324 mg Per Tube Daily  . bisacodyl  10 mg Oral Daily   Or  . bisacodyl  10 mg Rectal Daily  . chlorhexidine gluconate (MEDLINE KIT)  15 mL Mouth Rinse BID  . Chlorhexidine Gluconate Cloth  6 each Topical Daily  . docusate sodium  200 mg Oral Daily  . enoxaparin (LOVENOX) injection  30 mg Subcutaneous Q24H  . influenza vaccine adjuvanted  0.5 mL Intramuscular Tomorrow-1000  . insulin aspart  0-24 Units Subcutaneous Q4H  . latanoprost  1 drop Both Eyes QHS  . levothyroxine  25 mcg Oral q AM  . pantoprazole  40 mg Oral Daily  . simvastatin  40 mg Oral q1800  . sodium chloride flush  10-40 mL Intracatheter Q12H  . sodium chloride flush  3 mL Intravenous Q12H   Continuous Infusions: . sodium chloride Stopped (09/15/20 1756)  . sodium chloride Stopped (09/14/20 0800)  . sodium chloride 10 mL/hr at 09/17/20 1617  . furosemide (LASIX) infusion 6 mg/hr (09/18/20 1300)  . lactated ringers Stopped (09/15/20 1905)  . milrinone 0.125 mcg/kg/min (09/18/20 1300)   PRN Meds: sodium chloride, dextrose, levalbuterol, lip balm, metoprolol tartrate, ondansetron (ZOFRAN) IV, oxyCODONE, sodium chloride flush, traMADol  Allergies:   No Known Allergies  Social History:   Social History   Socioeconomic History  . Marital status: Widowed    Spouse name: Not on file  . Number of children: Not on file  . Years of education: Not on file  . Highest education level: Not on file  Occupational History  . Not on file  Tobacco Use  . Smoking status: Former Smoker    Quit  date: 12/16/1999    Years since quitting: 20.7  . Smokeless tobacco: Never Used  Vaping Use  . Vaping Use: Never used  Substance and Sexual Activity  . Alcohol use: No  . Drug use: No  . Sexual activity: Not on file  Other Topics Concern  . Not on file  Social History Narrative  . Not on file   Social Determinants of Health   Financial Resource Strain: Low Risk   . Difficulty of Paying Living Expenses: Not hard at all  Food Insecurity:   . Worried About Charity fundraiser in the Last Year: Not on file  . Ran Out of Food in the Last Year: Not on file  Transportation Needs: No Transportation Needs  . Lack of Transportation (Medical): No  . Lack of Transportation (Non-Medical): No  Physical Activity:   . Days of Exercise per Week: Not on file  . Minutes of Exercise per Session: Not on file  Stress:   . Feeling of Stress : Not on file  Social Connections:   . Frequency of Communication with Friends and Family: Not on file  . Frequency of Social Gatherings with Friends and Family: Not on file  . Attends Religious Services: Not on file  . Active Member of Clubs or Organizations: Not on file  . Attends Archivist Meetings: Not on file  . Marital Status: Not on file  Intimate Partner Violence:   . Fear of Current or Ex-Partner: Not on file  . Emotionally Abused: Not on file  . Physically Abused: Not on file  . Sexually Abused: Not on file    Family History:   Family History  Problem Relation Age of Onset  . Cancer Mother      ROS:  Please see the history of present illness.  All other ROS reviewed and negative.     Physical Exam/Data:   Vitals:   09/18/20 1200 09/18/20 1215 09/18/20 1230 09/18/20 1245  BP: 121/73  128/65   Pulse: 99 97 100 93  Resp: (!) 25 (!) 24 19 (!) 23  Temp:      TempSrc:      SpO2: 94% 94% 91% 94%  Weight:      Height:        Intake/Output Summary (Last 24 hours) at 09/18/2020 1418 Last data filed at 09/18/2020 1300 Gross per  24 hour  Intake 659.6 ml  Output 2910 ml  Net -2250.4 ml   Last 3 Weights 09/18/2020 09/17/2020 09/16/2020  Weight (lbs) 172 lb 9.9 oz 178 lb 12.7  oz 195 lb 1.7 oz  Weight (kg) 78.3 kg 81.1 kg 88.5 kg     Body mass index is 29.17 kg/m.  General: Well developed, well nourished fatigued appearing AAF Head: Normocephalic, atraumatic, sclera non-icteric, no xanthomas, nares are without discharge. Neck: Negative for carotid bruits. JVP not elevated. Lungs: Diminished coarse bibasilar breath sounds, no wheezing or rhonchi. Mild increased WOB  Heart: RRR S1 S2 without murmurs, rubs, or gallops. Sternotomy incision c/d/i Abdomen: Soft, non-tender, non-distended with normoactive bowel sounds. No rebound/guarding. Extremities: No clubbing or cyanosis. No edema. Distal pedal pulses are 2+ and equal bilaterally. Neuro: Alert and oriented X 3. Moves all extremities spontaneously. Psych:  Responds to questions appropriately with a normal affect.  EKG:  The EKG was personally reviewed and demonstrates:  Sinus tach 107bpm RBBB, TWI V1-V3, no significant change  Telemetry:  Telemetry was personally reviewed and demonstrates: NSR with brief run of what appears to be an atrial tachycardia overnight, then occasional PACs/short atrial run, & 1 ventricular triplet  Relevant CV Studies: POST-OP IMPRESSIONS  - Left Ventricle: The left ventricle is unchanged from pre-bypass.  - Aorta: A graft was placed in the ascending aorta for repair.  - Aortic Valve: There is moderate regurgitation.  - Mitral Valve: The mitral valve appears unchanged from pre-bypass.  - Tricuspid Valve: The tricuspid valve appears unchanged from pre-bypass.   PRE-OP FINDINGS  Left Ventricle: The left ventricle has low normal systolic function, with  an ejection fraction of 50-55%. The cavity size was normal. There is no  increase in left ventricular wall thickness.   Right Ventricle: The right ventricle has mildly reduced systolic  function.  The cavity was moderately enlarged. There is no increase in right  ventricular wall thickness.   Left Atrium: Left atrial size was not assessed.   Right Atrium: Right atrial size was not assessed.   Interatrial Septum: No atrial level shunt detected by color flow Doppler.   Pericardium: A moderately sized pericardial effusion is present. The  pericardial effusion is circumferential. There is no evidence of cardiac  tamponade.   Mitral Valve: The mitral valve is normal in structure. Mitral valve  regurgitation is trivial by color flow Doppler.   Tricuspid Valve: The tricuspid valve was normal in structure. Tricuspid  valve regurgitation is moderate by color flow Doppler.   Aortic Valve: The aortic valve is tricuspid Aortic valve regurgitation is  moderate to severe by color flow Doppler.   Pulmonic Valve: The pulmonic valve was normal in structure.  Pulmonic valve regurgitation is not visualized by color flow Doppler.    Aorta: There is evidence of a dissection in the aortic root. The  dissection can be classified as a DeBakey Type I. The dissection entry  point is at the aortic root. A false lumen can be seen that compresses the  true lumen.     Roderic Palau MD  Electronically signed by Roderic Palau MD  Signature Date/Time: 09/14/2020/3:49:49 AM     Laboratory Data:  High Sensitivity Troponin:   Recent Labs  Lab 09/13/20 1129 09/13/20 1354  TROPONINIHS 69* 56*     Chemistry Recent Labs  Lab 09/16/20 2225 09/17/20 0448 09/18/20 0405  NA 143 144 143  K 4.8 3.9 3.3*  CL 108 106 103  CO2 '27 28 29  ' GLUCOSE 117* 125* 138*  BUN 27* 27* 26*  CREATININE 1.43* 1.45* 1.31*  CALCIUM 7.5* 7.8* 7.8*  GFRNONAA 34* 33* 38*  GFRAA 39* 39* 44*  ANIONGAP 8  10 11    Recent Labs  Lab 09/13/20 1129 09/15/20 0323 09/18/20 0405  PROT 8.5* 5.6* 6.1*  ALBUMIN 3.8 2.9* 2.5*  AST 44* 64* 79*  ALT 20 29 40  ALKPHOS 45 38 61  BILITOT 2.2* 3.5* 1.4*    Hematology Recent Labs  Lab 09/15/20 1705 09/15/20 1705 09/15/20 2136 09/16/20 0335 09/18/20 0405  WBC 13.0*  --   --  14.3* 12.2*  RBC 4.03  --   --  3.94 3.66*  HGB 9.6*   < > 9.9* 9.1* 8.6*  HCT 29.6*   < > 29.0* 29.4* 27.4*  MCV 73.4*  --   --  74.6* 74.9*  MCH 23.8*  --   --  23.1* 23.5*  MCHC 32.4  --   --  31.0 31.4  RDW 20.3*  --   --  20.4* 21.7*  PLT 94*  --   --  104* 173   < > = values in this interval not displayed.   BNP Recent Labs  Lab 09/13/20 1129  BNP 388.1*    DDimer  Recent Labs  Lab 09/13/20 1130  DDIMER >20.00*     Radiology/Studies:  DG Chest Port 1 View  Result Date: 09/18/2020 CLINICAL DATA:  Status post open heart surgery.  Chest tube removed. EXAM: PORTABLE CHEST 1 VIEW COMPARISON:  09/17/2020. FINDINGS: Interim removal of right IJ line. Left PICC line noted with tip over cavoatrial junction. Prior CABG. Severe cardiomegaly. Diffuse bilateral interstitial infiltrates/edema. Persistent bibasilar atelectasis. Small bilateral pleural effusions again noted. No pneumothorax. IMPRESSION: 1. Interim removal of right IJ line. Left PICC line noted with tip over cavoatrial junction. 2. Prior CABG. Severe cardiomegaly. Diffuse bilateral interstitial infiltrates/edema. Small bilateral pleural effusions again noted. No interim change from prior exam. 3. Persistent bibasilar atelectasis. Electronically Signed   By: Marcello Moores  Register   On: 09/18/2020 07:13   DG Chest Port 1 View  Result Date: 09/17/2020 CLINICAL DATA:  Shortness of breath.  Recent aortic dissection EXAM: PORTABLE CHEST 1 VIEW COMPARISON:  September 16, 2020 FINDINGS: Cordis tip is in the superior vena cava. No pneumothorax. There is a persistent right pleural effusion. There is airspace consolidation in the medial lung base regions. Apparent previous small left pleural effusion no longer evident. There is cardiomegaly with pulmonary vascularity within normal limits. Mediastinal prominence may be  consistent with known aneurysm in recent surgery. Appearance stable. No adenopathy evident. No bone lesions. IMPRESSION: Central catheter position unchanged without pneumothorax. Stable cardiac silhouette. Right pleural effusion noted. Bibasilar consolidation, probably largely due to atelectasis with potential superimposed pneumonia in these areas. Previously noted left pleural effusion no longer evident. Electronically Signed   By: Lowella Grip III M.D.   On: 09/17/2020 08:04   DG Chest Port 1 View  Result Date: 09/16/2020 CLINICAL DATA:  Aortic dissection EXAM: PORTABLE CHEST 1 VIEW COMPARISON:  September 15, 2020 FINDINGS: The cardiomediastinal silhouette is unchanged and enlarged in contour.Removal of PA catheter. RIGHT IJ PA catheter sheath tip terminates over the SVC. Interval extubation. Small bilateral pleural effusions. There is mildly increased fluid within the RIGHT minor fissure. Diffuse interstitial prominence and peribronchial cuffing, mildly increased. Bibasilar homogeneous opacities, likely atelectasis. No significant pneumothorax. Visualized abdomen is unremarkable. Multilevel degenerative changes of the thoracic spine. IMPRESSION: Mildly increased pulmonary edema, small bilateral pleural effusions, and atelectasis. Electronically Signed   By: Valentino Saxon MD   On: 09/16/2020 08:11   DG Chest Port 1 View  Result Date: 09/15/2020 CLINICAL DATA:  Shortness of breath EXAM: PORTABLE CHEST 1 VIEW COMPARISON:  September 14, 2020 FINDINGS: The cardiomediastinal silhouette is unchanged and enlarged in contour.ETT tip terminates 4.9 cm above the carina. RIGHT IJ PA catheter tip terminates over the region of the RIGHT ventricle. Temporary cardiac pacing wires. Midline drains. Small bilateral pleural effusions. Diffuse interstitial prominence, similar in comparison to prior. LEFT basilar homogeneous opacification, likely atelectasis. No pneumothorax. Visualized abdomen is unremarkable. Multilevel  degenerative changes of the thoracic spine. IMPRESSION: 1. Support apparatus as described above. RIGHT IJ PA catheter tip terminates over the region of the RIGHT ventricle. 2. Mild pulmonary edema with small bilateral pleural effusions. Electronically Signed   By: Valentino Saxon MD   On: 09/15/2020 08:53   [place] EKG SITE RITE  Result Date: 09/17/2020 If Site Rite image not attached, placement could not be confirmed due to current cardiac rhythm.   Assessment and Plan:   1. Type 1 aortic dissection s/p replacement of ascending aorta using a 72m Hemashield graft - management per surgery - on full dose ASA - had been kept NPO 10/4 with risk of reintubation, pending eval by SLP, PCCM plans Cortrak 10/6 with EN if unable to take POs - intraop TEE showed moderate AI, moderate TR, mild MR and moderate pericardial effusion -> per preliminarily discussion with Dr. HDebara Pickett plan f/u limited echocardiogram tomorrow to establish new baseline  2. Acute hypoxic respiratory failure likely due to pulmonary edema/bilateral pleural effusions - PCCM has been managing diuretics, currently on Lasix gtt at 692mhr with peak weight 196lb, down to 172lb today - Cr improving, clinically improving as well  3. Shock, post-operatively, felt cardiogenic in nature - previously on dopamine - currently maintained on milrinone, no changes made at present time pending discussion with MD  4. AKI superimposed on CKD stage III - right renal artery occluded due to dissection - prior baseline 1.1-1.3 - peak Cr 1.97 this admission, improved back to 1.31 today  5. Brief atrial tachycardia, occasional PACs, 1 ventricular triplet - lyte optimization per primary team - keep K closer to 4.0 or greater (last value 3.3 -> subsequent repletion given), Mg 2.0 or greater (last value 2.0) - consider addition of beta blocker when BP consistently stable and off inotropes  6. Post-op anemia - Hgb 8.6 today (mostly 9 range the last few  days), per primary team  7. Hypokalemia - potassium repletion was ordered today  8. Pulmonary nodule and right axillary lymph node - noted on original CT, will need OP f/u as appropriate after discharge   For questions or updates, please contact [facility] consult [email or web address] for contact info under    Signed, DaCharlie PitterPA-C  09/18/2020 2:18 PM

## 2020-09-18 NOTE — Evaluation (Signed)
Physical Therapy Evaluation Patient Details Name: Danielle Atkins MRN: [id number] DOB: January 13, 1935 Today's Date: 09/18/2020   History of Present Illness  85 yo admitted with chest pain 9/27 with aortic dissection s/p thoracic aortic aneurysm repair on 9/30 who self extubated 10/2 with progressive respiratory failure requiring bipap. PMhx:HTN, HLD, upper GIB, DM, gout  Clinical Impression  Pt very pleasant sitting in chair and eager to return to bed. Pt educated for sternal precautions with handout provided. Pt with decreased transfers, gait and functional mobility who will benefit from acute therapy to maximize mobility, safety and function to decrease burden of care. Pt encouraged to be OOB daily with nursing assist.   HR 102 SpO2 88-91% on 15L HFNC    Follow Up Recommendations Home health PT    Equipment Recommendations  Rolling walker with 5" wheels;3in1 (PT)    Recommendations for Other Services OT consult     Precautions / Restrictions Precautions Precautions: Sternal;Fall Precaution Booklet Issued: Yes (comment) Precaution Comments: watch sats Restrictions Weight Bearing Restrictions: Yes (STERNAL PRECAUTIONS)      Mobility  Bed Mobility Overal bed mobility: Needs Assistance Bed Mobility: Sit to Supine       Sit to supine: Min assist   General bed mobility comments: min assist to bring legs to surface with pt holding pillow, cues for sequence and safety  Transfers Overall transfer level: Needs assistance   Transfers: Sit to/from Stand Sit to Stand: Min assist         General transfer comment: cues for hand placement, assist to lean forward and rise from surface  Ambulation/Gait Ambulation/Gait assistance: Min assist Gait Distance (Feet): 4 Feet Assistive device: 1 person hand held assist Gait Pattern/deviations: Step-to pattern   Gait velocity interpretation: 1.31 - 2.62 ft/sec, indicative of limited community ambulator General Gait Details: pt with  bil UE on therapist arms to step from chair to bed 4'. Pt declined further ambulation due to fatigue as well as SpO2 88% on 15L limiting activity  Stairs            Wheelchair Mobility    Modified Rankin (Stroke Patients Only)       Balance Overall balance assessment: Mild deficits observed, not formally tested                                           Pertinent Vitals/Pain Pain Assessment: No/denies pain    Home Living Family/patient expects to be discharged to:: Private residence Living Arrangements: Children Available Help at Discharge: Family;Available 24 hours/day Type of Home: House Home Access: Stairs to enter   [employer] of Steps: 2 Home Layout: One level Home Equipment: Crutches      Prior Function Level of Independence: Independent               Hand Dominance        Extremity/Trunk Assessment   Upper Extremity Assessment Upper Extremity Assessment: Generalized weakness    Lower Extremity Assessment Lower Extremity Assessment: Generalized weakness    Cervical / Trunk Assessment Cervical / Trunk Assessment: Normal  Communication   Communication: No difficulties  Cognition Arousal/Alertness: Awake/alert Behavior During Therapy: WFL for tasks assessed/performed Overall Cognitive Status: Within Functional Limits for tasks assessed  General Comments      Exercises     Assessment/Plan    PT Assessment Patient needs continued PT services  PT Problem List Decreased mobility;Decreased safety awareness;Decreased activity tolerance;Decreased balance;Decreased knowledge of use of DME;Decreased knowledge of precautions       PT Treatment Interventions Gait training;Stair training;Functional mobility training;Therapeutic activities;Patient/family education;DME instruction;Therapeutic exercise    PT Goals (Current goals can be found in the Care Plan  section)  Acute Rehab PT Goals Patient Stated Goal: return to gambling at the casino, travel PT Goal Formulation: With patient Time For Goal Achievement: 10/02/20 Potential to Achieve Goals: Good    Frequency Min 3X/week   Barriers to discharge        Co-evaluation               AM-PAC PT "6 Clicks" Mobility  Outcome Measure Help needed turning from your back to your side while in a flat bed without using bedrails?: A Little Help needed moving from lying on your back to sitting on the side of a flat bed without using bedrails?: A Little Help needed moving to and from a bed to a chair (including a wheelchair)?: A Little Help needed standing up from a chair using your arms (e.g., wheelchair or bedside chair)?: A Little Help needed to walk in hospital room?: A Little Help needed climbing 3-5 steps with a railing? : A Lot 6 Click Score: 17    End of Session Equipment Utilized During Treatment: Gait belt Activity Tolerance: Patient tolerated treatment well Patient left: in bed;with call bell/phone within reach;with nursing/sitter in room Nurse Communication: Mobility status PT Visit Diagnosis: Other abnormalities of gait and mobility (R26.89);Difficulty in walking, not elsewhere classified (R26.2)    Time: 6629-4765 PT Time Calculation (min) (ACUTE ONLY): 16 min   Charges:   PT Evaluation $PT Eval Moderate Complexity: 1 Mod          [place], PT Acute Rehabilitation Services Pager: [phone] Office: [phone]   Danielle Atkins B Danielle Atkins 09/18/2020, 10:16 AM

## 2020-09-18 NOTE — Evaluation (Signed)
Clinical/Bedside Swallow Evaluation Patient Details  Name: Oleta Gunnoe MRN: [id number] Date of Birth: 11/11/1935  Today's Date: 09/18/2020 Time: SLP Start Time (ACUTE ONLY): 9532 SLP Stop Time (ACUTE ONLY): 1414 SLP Time Calculation (min) (ACUTE ONLY): 10 min  Past Medical History:  Past Medical History:  Diagnosis Date  . ANEMIA-NOS 06/25/2007  . CKD (chronic kidney disease), stage III ([place])   . Diabetes mellitus ([place])   . Former tobacco use   . GOUT 11/16/2009  . HTN (hypertension)   . Hyperlipidemia   . PARESTHESIA, HANDS 03/27/2009  . PEPTIC ULCER DISEASE 06/25/2007  . RBBB   . TIA (transient ischemic attack)   . Upper GI bleed    Past Surgical History:  Past Surgical History:  Procedure Laterality Date  . THORACIC AORTIC ANEURYSM REPAIR N/A 09/13/2020   Procedure: THORACIC ASCENDING ANEURYSM REPAIR (AAA) USING HEMASHIELD PLATINUM 32MM x 30CM WOVEN DOUBLE VELOUR GRAFT;  Surgeon: [name], MD;  Location: [facility];  Service: Open Heart Surgery;  Laterality: N/A;   HPI:  Ms. Moralez is an 85 year old woman with a history of HLD, HTN, PUD with history of UGIB, TIA who presented with CP on 9/27 and unfortunately had a prolonged wait time in the ED before she was evaluated and found to have a type I aortic dissection. She was emergently transferred to [facility] for TCTS evaluation. She underwent an aortic graft repair on 9/30 and was transferred to the [name] intubated. She self-extubated on 10/2 and has had progressively increasing oxygen requirements since. She now requires BiPAP and has been unable to take breaks without desaturations. Improving 10/5.    Assessment / Plan / Recommendation Clinical Impression  Pt demonstrates no further signs of dysphagia. Daughter at bedside reports that pt did have some initial coughing with intake after self extubation. Now pt is able to tolerate thin liquids without signs of aspiration and has been sipping onclears all day without difficulty. Will  upgrade to regular thin. No SLP f/u needed.  SLP Visit Diagnosis: Dysphagia, unspecified (R13.10)    Aspiration Risk       Diet Recommendation Thin liquid;Regular   Liquid Administration via: Cup;Straw Medication Administration: Whole meds with liquid Supervision: Patient able to self feed    Other  Recommendations     Follow up Recommendations None      Frequency and Duration            Prognosis        Swallow Study   General HPI: Ms. Klemmer is an 85 year old woman with a history of HLD, HTN, PUD with history of UGIB, TIA who presented with CP on 9/27 and unfortunately had a prolonged wait time in the ED before she was evaluated and found to have a type I aortic dissection. She was emergently transferred to [facility] for TCTS evaluation. She underwent an aortic graft repair on 9/30 and was transferred to the [name] intubated. She self-extubated on 10/2 and has had progressively increasing oxygen requirements since. She now requires BiPAP and has been unable to take breaks without desaturations. Improving 10/5.  Type of Study: Bedside Swallow Evaluation Previous Swallow Assessment: none Diet Prior to this Study: Regular;Thin liquids Temperature Spikes Noted: No Respiratory Status: Room air History of Recent Intubation: No Behavior/Cognition: Alert;Cooperative;Pleasant mood Oral Cavity Assessment: Within Functional Limits Oral Care Completed by SLP: No Oral Cavity - Dentition: Dentures, not available;Missing dentition Patient Positioning: Upright in bed Baseline Vocal Quality: Normal Volitional Cough: Strong    Oral/Motor/Sensory Function Overall  Oral Motor/Sensory Function: Within functional limits   Ice Chips     Thin Liquid Thin Liquid: Within functional limits    Nectar Thick Nectar Thick Liquid: Not tested   Honey Thick Honey Thick Liquid: Not tested   Puree Puree: Within functional limits   Solid     Solid: Within functional limits     Herbie [place], MA [place] Pager [phone] Office [phone]  Bela Bonaparte, Katherene Ponto 09/18/2020,3:18 PM

## 2020-09-18 NOTE — Progress Notes (Signed)
Pt on resting comfortably on NRB mask SPO2 96% RR 20. No increased WOB noted.

## 2020-09-19 ENCOUNTER — Encounter (HOSPITAL_COMMUNITY): Payer: Self-pay | Admitting: Cardiothoracic Surgery

## 2020-09-19 ENCOUNTER — Inpatient Hospital Stay (HOSPITAL_COMMUNITY): Payer: Medicare HMO

## 2020-09-19 DIAGNOSIS — J9601 Acute respiratory failure with hypoxia: Secondary | ICD-10-CM | POA: Diagnosis not present

## 2020-09-19 DIAGNOSIS — I361 Nonrheumatic tricuspid (valve) insufficiency: Secondary | ICD-10-CM

## 2020-09-19 DIAGNOSIS — I313 Pericardial effusion (noninflammatory): Secondary | ICD-10-CM | POA: Diagnosis not present

## 2020-09-19 DIAGNOSIS — I7101 Dissection of thoracic aorta: Secondary | ICD-10-CM | POA: Diagnosis not present

## 2020-09-19 DIAGNOSIS — I351 Nonrheumatic aortic (valve) insufficiency: Secondary | ICD-10-CM

## 2020-09-19 DIAGNOSIS — J810 Acute pulmonary edema: Secondary | ICD-10-CM | POA: Diagnosis not present

## 2020-09-19 LAB — CBC
HCT: 29.2 % — ABNORMAL LOW (ref 36.0–46.0)
Hemoglobin: 8.9 g/dL — ABNORMAL LOW (ref 12.0–15.0)
MCH: 22.9 pg — ABNORMAL LOW (ref 26.0–34.0)
MCHC: 30.5 g/dL (ref 30.0–36.0)
MCV: 75.1 fL — ABNORMAL LOW (ref 80.0–100.0)
Platelets: 257 10*3/uL (ref 150–400)
RBC: 3.89 MIL/uL (ref 3.87–5.11)
RDW: 22.2 % — ABNORMAL HIGH (ref 11.5–15.5)
WBC: 11.6 10*3/uL — ABNORMAL HIGH (ref 4.0–10.5)
nRBC: 0.6 % — ABNORMAL HIGH (ref 0.0–0.2)

## 2020-09-19 LAB — COOXEMETRY PANEL
Carboxyhemoglobin: 1.4 % (ref 0.5–1.5)
Methemoglobin: 0.8 % (ref 0.0–1.5)
O2 Saturation: 57.7 %
Total hemoglobin: 10.9 g/dL — ABNORMAL LOW (ref 12.0–16.0)

## 2020-09-19 LAB — BASIC METABOLIC PANEL
Anion gap: 10 (ref 5–15)
BUN: 25 mg/dL — ABNORMAL HIGH (ref 8–23)
CO2: 27 mmol/L (ref 22–32)
Calcium: 8.1 mg/dL — ABNORMAL LOW (ref 8.9–10.3)
Chloride: 106 mmol/L (ref 98–111)
Creatinine, Ser: 1.35 mg/dL — ABNORMAL HIGH (ref 0.44–1.00)
GFR calc non Af Amer: 36 mL/min — ABNORMAL LOW (ref >60)
Glucose, Bld: 149 mg/dL — ABNORMAL HIGH (ref 70–99)
Potassium: 3.6 mmol/L (ref 3.5–5.1)
Sodium: 143 mmol/L (ref 135–145)

## 2020-09-19 LAB — LIPID PANEL
Cholesterol: 86 mg/dL (ref 0–200)
HDL: 23 mg/dL — ABNORMAL LOW (ref >40)
LDL Cholesterol: 44 mg/dL (ref 0–99)
Total CHOL/HDL Ratio: 3.7 RATIO
Triglycerides: 96 mg/dL (ref <150)
VLDL: 19 mg/dL (ref 0–40)

## 2020-09-19 LAB — MAGNESIUM: Magnesium: 1.7 mg/dL (ref 1.7–2.4)

## 2020-09-19 LAB — HEPATIC FUNCTION PANEL
ALT: 43 U/L (ref 0–44)
AST: 82 U/L — ABNORMAL HIGH (ref 15–41)
Albumin: 2.4 g/dL — ABNORMAL LOW (ref 3.5–5.0)
Alkaline Phosphatase: 68 U/L (ref 38–126)
Bilirubin, Direct: 0.4 mg/dL — ABNORMAL HIGH (ref 0.0–0.2)
Indirect Bilirubin: 0.9 mg/dL (ref 0.3–0.9)
Total Bilirubin: 1.3 mg/dL — ABNORMAL HIGH (ref 0.3–1.2)
Total Protein: 6.4 g/dL — ABNORMAL LOW (ref 6.5–8.1)

## 2020-09-19 LAB — GLUCOSE, CAPILLARY
Glucose-Capillary: 113 mg/dL — ABNORMAL HIGH (ref 70–99)
Glucose-Capillary: 115 mg/dL — ABNORMAL HIGH (ref 70–99)
Glucose-Capillary: 125 mg/dL — ABNORMAL HIGH (ref 70–99)
Glucose-Capillary: 127 mg/dL — ABNORMAL HIGH (ref 70–99)
Glucose-Capillary: 167 mg/dL — ABNORMAL HIGH (ref 70–99)
Glucose-Capillary: 81 mg/dL (ref 70–99)

## 2020-09-19 LAB — ECHOCARDIOGRAM LIMITED
Height: 64.5 in
P 1/2 time: 597 msec
Weight: 2465.62 oz

## 2020-09-19 LAB — TSH: TSH: 4.607 u[IU]/mL — ABNORMAL HIGH (ref 0.350–4.500)

## 2020-09-19 MED ORDER — POTASSIUM CHLORIDE CRYS ER 20 MEQ PO TBCR
40.0000 meq | EXTENDED_RELEASE_TABLET | Freq: Once | ORAL | Status: AC
  Administered 2020-09-19: 40 meq via ORAL
  Filled 2020-09-19: qty 2

## 2020-09-19 MED ORDER — MILRINONE LACTATE IN DEXTROSE 20-5 MG/100ML-% IV SOLN
0.1250 ug/kg/min | INTRAVENOUS | Status: DC
  Administered 2020-09-20 – 2020-09-25 (×5): 0.125 ug/kg/min via INTRAVENOUS
  Filled 2020-09-19 (×5): qty 100

## 2020-09-19 MED ORDER — ENSURE ENLIVE PO LIQD
237.0000 mL | Freq: Two times a day (BID) | ORAL | Status: DC
  Administered 2020-09-21 – 2020-10-05 (×15): 237 mL via ORAL

## 2020-09-19 NOTE — Progress Notes (Signed)
  Echocardiogram 2D Echocardiogram has been performed.  Jennette Dubin 09/19/2020, 1:37 PM

## 2020-09-19 NOTE — Progress Notes (Signed)
NAME:  Danielle Atkins, MRN:  [id number], DOB:  1935/08/20, LOS: 6 ADMISSION DATE:  09/13/2020, CONSULTATION DATE:  10/3 REFERRING MD:  Danielle Atkins, CHIEF COMPLAINT:  Worsening hypoxia   Brief History   Acute type I aortic dissection from L carotid artery to iliacs on 9/30, s/p repair with graft  History of present illness   Danielle Atkins is an 85 year old woman with a history of HLD, HTN, PUD with history of UGIB, TIA who presented with CP on 9/27 and unfortunately had a prolonged wait time in the ED before she was evaluated and found to have a type I aortic dissection. She was emergently transferred to [facility] for TCTS evaluation. She underwent an aortic graft repair on 9/30 and was transferred to the [name] intubated. She self-extubated on 10/2 and has had progressively increasing oxygen requirements since. She now requires BiPAP and has been unable to take breaks without desaturations. She denies other complaints currently. Her son is at bedside who has provided some of the history. Per her daughter, she is active at baseline, but has been walking with her friends less over the past 6 months. She has an albuterol inhaler at home that she uses infrequently, but no known lung disease. She has had more SOB for the past ~6 months, but is unsure why. She quit smoking about 25 years ago and was never a heavy smoker.   Past Medical History  HLD HTN CKD2 NIDDM- PTA metformin Gout  Significant Hospital Events     Consults:  PCCM TCTS  Procedures:  Aortic dissection repair trialysis placement  Significant Diagnostic Tests:  TEE LVEF 50-55%, dilated RV  Micro Data:  9/30 urine> NG 9/30 blood> NG  Antimicrobials:  vanc 9/30 -10/1 Cefuroxime 9/30> 10/2  Interim history/subjective:   K 3.3 - replacing Cr 1.31 (decr from 1.45)  SvO2 62.3 Off nitro Lasix gtt at 6 Off dopamine Milrinone at 0.125   On HFNC, off NRB  Worked with PT this morning   Objective   Blood pressure 120/71, pulse  (!) 105, temperature 98 F (36.7 C), temperature source Oral, resp. rate (!) 25, height 5' 4.5" (1.638 m), weight 69.9 kg, SpO2 94 %.        Intake/Output Summary (Last 24 hours) at 09/19/2020 0829 Last data filed at 09/19/2020 0602 Gross per 24 hour  Intake 744.74 ml  Output 1455 ml  Net -710.26 ml   Filed Weights   09/17/20 0615 09/18/20 0500 09/19/20 0500  Weight: 81.1 kg 78.3 kg 69.9 kg    General:  Awake, sitting up in chair eating breakfast HEENT: MM pink/moist, sclera anicteric  Neuro: awake, A&Ox3, conversational CV: s1s2 rrr, no m/r/g, healing surgical site PULM:  Good oxygen saturations on 8L HFNC, CTAB GI: soft, bsx4 active  Extremities: warm/dry, no edema  Skin: no rashes or lesions   Resolved Hospital Problem list     Assessment & Plan:   Acute hypoxic respiratory failure, improving - due to acute pulmonary edema, bilateral pleural effusions, ? Atelectasis  -on continued Lasix gtt with 0.60ml/kg/hr out -Repeat Echo pending  -Able to ambulate on HFNC  Type I aortic dissection, status post graft repair -Stable, plan per TCTS -On Asa and Statin   Shock, likely cardiogenic Improving and off pressors, intraoperative TEE with LVEF of 50-55% -Continue Milrinone, wean per cardiology, Coox with decreased SVO2 57% down from 75% yesterday, though clinically stable -Lasix gtt    Diabetes with controlled hyperglycemia -Continue SSI with glucose goal 140-180  AKI vs CKD 4 Creatinine stable 1.3 after Right renal artery occluded due to dissection -continue to follow UOP and electrolytes   Hypokalemia -improved, continue to follow while on Lasix gtt  *Pt is stable from a respiratory stand-point, PCCM will sign off.  Please re-engage for any concerns or worsening clinical status  Best practice:  Diet: NPO -- tolerating po's today Pain/Anxiety/Delirium protocol (if indicated): n/a VAP protocol (if indicated): n/a DVT prophylaxis: Lovenox GI prophylaxis:  Pantoprazole Glucose control: SSI Mobility: Bedrest Code Status: Full Family Communication: . Family communication per primary Disposition: [name]  Labs   CBC: Recent Labs  Lab 09/13/20 1129 09/13/20 1913 09/15/20 0323 09/15/20 0539 09/15/20 1705 09/15/20 2136 09/16/20 0335 09/18/20 0405 09/19/20 0427  WBC 16.8*   < > 10.9*  --  13.0*  --  14.3* 12.2* 11.6*  NEUTROABS 13.5*  --   --   --   --   --   --   --   --   HGB 11.2*   < > 9.5*   < > 9.6* 9.9* 9.1* 8.6* 8.9*  HCT 36.7   < > 28.6*   < > 29.6* 29.0* 29.4* 27.4* 29.2*  MCV 69.4*   < > 72.4*  --  73.4*  --  74.6* 74.9* 75.1*  PLT 109*   < > 81*  --  94*  --  104* 173 257   < > = values in this interval not displayed.    Basic Metabolic Panel: Recent Labs  Lab 09/14/20 0350 09/14/20 0539 09/14/20 0831 09/14/20 1112 09/15/20 1705 09/15/20 2136 09/16/20 2225 09/17/20 0448 09/18/20 0405 09/18/20 1605 09/19/20 0427  NA 143   < > 141   < > 141   < > 143 144 143 142 143  K 4.1   < > 4.0   < > 4.2   < > 4.8 3.9 3.3* 3.5 3.6  CL 107   < > 106   < > 108   < > 108 106 103 104 106  CO2 24   < > 24   < > 22   < > 27 28 29 27 27   GLUCOSE 197*   < > 213*   < > 129*   < > 117* 125* 138* 178* 149*  BUN 23   < > 25*   < > 29*   < > 27* 27* 26* 27* 25*  CREATININE 1.41*   < > 1.66*   < > 1.57*   < > 1.43* 1.45* 1.31* 1.38* 1.35*  CALCIUM 7.6*   < > 7.9*   < > 7.8*   < > 7.5* 7.8* 7.8* 8.3* 8.1*  MG 1.4*  --  2.4  --  2.1  --  2.0  --   --   --  1.7   < > = values in this interval not displayed.   GFR: Estimated Creatinine Clearance: 30.7 mL/min (A) (by C-G formula based on SCr of 1.35 mg/dL (H)). Recent Labs  Lab 09/13/20 1129 09/13/20 1354 09/14/20 0350 09/15/20 1705 09/16/20 0335 09/18/20 0405 09/19/20 0427  WBC 16.8*  --    < > 13.0* 14.3* 12.2* 11.6*  LATICACIDVEN 2.2* 1.2  --   --   --   --   --    < > = values in this interval not displayed.    Liver Function Tests: Recent Labs  Lab 09/13/20 1129  09/15/20 0323 09/18/20 0405 09/19/20 0427  AST 44* 64* 79*  82*  ALT 20 29 40 43  ALKPHOS 45 38 61 68  BILITOT 2.2* 3.5* 1.4* 1.3*  PROT 8.5* 5.6* 6.1* 6.4*  ALBUMIN 3.8 2.9* 2.5* 2.4*   No results for input(s): LIPASE, AMYLASE in the last 168 hours. No results for input(s): AMMONIA in the last 168 hours.  ABG    Component Value Date/Time   PHART 7.478 (H) 09/15/2020 2136   PCO2ART 36.0 09/15/2020 2136   PO2ART 58 (L) 09/15/2020 2136   HCO3 26.8 09/15/2020 2136   TCO2 28 09/15/2020 2136   ACIDBASEDEF 2.0 09/14/2020 0048   O2SAT 57.7 09/19/2020 0427     Coagulation Profile: Recent Labs  Lab 09/14/20 0350  INR 1.0    Cardiac Enzymes: No results for input(s): CKTOTAL, CKMB, CKMBINDEX, TROPONINI in the last 168 hours.  HbA1C: Hemoglobin A1C  Date/Time Value Ref Range Status  07/20/2020 09:40 AM 5.5 4.0 - 5.6 % Final  12/01/2019 07:07 AM 5.5 4.0 - 5.6 % Final   Hgb A1c MFr Bld  Date/Time Value Ref Range Status  09/14/2020 03:50 AM 5.8 (H) 4.8 - 5.6 % Final    Comment:    (NOTE)         Prediabetes: 5.7 - 6.4         Diabetes: >6.4         Glycemic control for adults with diabetes: <7.0   09/01/2019 07:38 AM 6.5 4.6 - 6.5 % Final    Comment:    Glycemic Control Guidelines for People with Diabetes:Non Diabetic:  <6%Goal of Therapy: <7%Additional Action Suggested:  >8%     CBG: Recent Labs  Lab 09/18/20 1140 09/18/20 1610 09/18/20 2005 09/19/20 0021 09/19/20 0350  GLUCAP 114* 152* 137* 115* 127*    CRITICAL CARE Performed by: Otilio Carpen Adreana Coull   Total critical care time: 40 minutes  Critical care time was exclusive of separately billable procedures and treating other patients. Critical care was necessary to treat or prevent imminent or life-threatening deterioration.  Critical care was time spent personally by me on the following activities: development of treatment plan with patient and/or surrogate as well as nursing, discussions with consultants,  evaluation of patient's response to treatment, examination of patient, obtaining history from patient or surrogate, ordering and performing treatments and interventions, ordering and review of laboratory studies, ordering and review of radiographic studies, pulse oximetry and re-evaluation of patient's condition.Otilio Carpen Mychele Seyller, PA-C [place] [facility]  Pager# [phone], if no answer [phone]

## 2020-09-19 NOTE — Progress Notes (Signed)
Patient ID: Danielle Atkins, female   DOB: 09-05-1935, 85 y.o.   MRN: [id number] TCTS DAILY [name] PROGRESS NOTE                   [address]            [place], [address]          [phone]   6 Days Post-Op Procedure(s) (LRB): THORACIC ASCENDING ANEURYSM REPAIR (AAA) USING HEMASHIELD PLATINUM 32MM x 30CM WOVEN DOUBLE VELOUR GRAFT (N/A)  Total Length of Stay:  LOS: 6 days   Subjective: Awake and alert, talkative. Gives details of her stay in ER for 9 hours- says she was never examined, stayed only in lobby , noted they did bring her water and blankets while she waited.   Walked in hall this am - does get sob with activity, sats better , did not require bipap last night  Objective: Vital signs in last 24 hours: Temp:  [97.7 F (36.5 C)-98.7 F (37.1 C)] 98 F (36.7 C) (10/06 0400) Pulse Rate:  [86-111] 105 (10/06 0647) Cardiac Rhythm: Sinus tachycardia (10/06 0402) Resp:  [16-31] 25 (10/06 0647) BP: (102-131)/(61-77) 120/71 (10/06 0602) SpO2:  [87 %-100 %] 94 % (10/06 0647) Weight:  [69.9 kg] 69.9 kg (10/06 0500)  Filed Weights   09/17/20 0615 09/18/20 0500 09/19/20 0500  Weight: 81.1 kg 78.3 kg 69.9 kg    Weight change: -8.4 kg   Hemodynamic parameters for last 24 hours:    Intake/Output from previous day: 10/05 0701 - 10/06 0700 In: 802.1 [P.O.:420; I.V.:216; IV Piggyback:166.1] Out: 1530 [Urine:1530]  Intake/Output this shift: No intake/output data recorded.  Current Meds: Scheduled Meds: . acetaminophen  1,000 mg Oral Q6H   Or  . acetaminophen (TYLENOL) oral liquid 160 mg/5 mL  1,000 mg Per Tube Q6H  . allopurinol  300 mg Oral Daily  . aspirin EC  325 mg Oral Daily   Or  . aspirin  324 mg Per Tube Daily  . bisacodyl  10 mg Oral Daily   Or  . bisacodyl  10 mg Rectal Daily  . chlorhexidine gluconate (MEDLINE KIT)  15 mL Mouth Rinse BID  . Chlorhexidine Gluconate Cloth  6 each Topical Daily  . docusate sodium  200 mg Oral Daily  .  enoxaparin (LOVENOX) injection  30 mg Subcutaneous Q24H  . feeding supplement  1 Container Oral TID BM  . influenza vaccine adjuvanted  0.5 mL Intramuscular Tomorrow-1000  . insulin aspart  0-24 Units Subcutaneous Q4H  . latanoprost  1 drop Both Eyes QHS  . levothyroxine  25 mcg Oral q AM  . pantoprazole  40 mg Oral Daily  . potassium chloride  40 mEq Oral BID  . simvastatin  40 mg Oral q1800  . sodium chloride flush  10-40 mL Intracatheter Q12H  . sodium chloride flush  3 mL Intravenous Q12H   Continuous Infusions: . sodium chloride Stopped (09/15/20 1756)  . sodium chloride Stopped (09/14/20 0800)  . sodium chloride 10 mL/hr at 09/17/20 1617  . furosemide (LASIX) infusion 6 mg/hr (09/19/20 0600)  . lactated ringers Stopped (09/15/20 1905)  . milrinone 0.125 mcg/kg/min (09/19/20 0600)   PRN Meds:.sodium chloride, dextrose, levalbuterol, lip balm, metoprolol tartrate, ondansetron (ZOFRAN) IV, oxyCODONE, sodium chloride flush, traMADol  General appearance: alert, cooperative and appears stated age Neurologic: intact Heart: regular rate and rhythm, S1, S2 normal, no murmur, click, rub or gallop Lungs: diminished breath sounds bibasilar Abdomen: soft, non-tender; bowel  sounds normal; no masses,  no organomegaly Extremities: edema decreasing edema Wound: sternum stable   Lab Results: CBC: Recent Labs    09/18/20 0405 09/19/20 0427  WBC 12.2* 11.6*  HGB 8.6* 8.9*  HCT 27.4* 29.2*  PLT 173 257   BMET:  Recent Labs    09/18/20 1605 09/19/20 0427  NA 142 143  K 3.5 3.6  CL 104 106  CO2 27 27  GLUCOSE 178* 149*  BUN 27* 25*  CREATININE 1.38* 1.35*  CALCIUM 8.3* 8.1*    CMET: Lab Results  Component Value Date   WBC 11.6 (H) 09/19/2020   HGB 8.9 (L) 09/19/2020   HCT 29.2 (L) 09/19/2020   PLT 257 09/19/2020   GLUCOSE 149 (H) 09/19/2020   CHOL 86 09/19/2020   TRIG 96 09/19/2020   HDL 23 (L) 09/19/2020   LDLCALC 44 09/19/2020   ALT 43 09/19/2020   AST 82 (H)  09/19/2020   NA 143 09/19/2020   K 3.6 09/19/2020   CL 106 09/19/2020   CREATININE 1.35 (H) 09/19/2020   BUN 25 (H) 09/19/2020   CO2 27 09/19/2020   TSH 4.607 (H) 09/19/2020   INR 1.0 09/14/2020   HGBA1C 5.8 (H) 09/14/2020   MICROALBUR 5.9 (H) 10/01/2016      PT/INR: No results for input(s): LABPROT, INR in the last 72 hours. Radiology: No results found.   Assessment/Plan: S/P Procedure(s) (LRB): THORACIC ASCENDING ANEURYSM REPAIR (AAA) USING HEMASHIELD PLATINUM 32MM x 30CM WOVEN DOUBLE VELOUR GRAFT (N/A) Mobilize Diuresis foley removed Renal function stable and below admission cr level Wean milrinone as tolerated    Danielle Atkins 09/19/2020 7:53 AM

## 2020-09-19 NOTE — Progress Notes (Signed)
TCTS BRIEF SICU PROGRESS NOTE  6 Days Post-Op  S/P Procedure(s) (LRB): TYPE 1 AORTIC DISSECTION WITH REPLACEMENT OF ASCENDING AORTA WITH 32MM HEMASHIELD GRAFT, RESUSPENSION OF AORTIC VALVE AND HYPOTHERMIC  CIRCULATORY ARRST WITH CEREBRAL PREFUSION   (N/A)   Stable day Stable HR and BP Breathing comfortably w/ O2 sats 94% on 8 L/min via HFNC  Plan: Continue current plan  Rexene Alberts, MD 09/19/2020 4:54 PM

## 2020-09-19 NOTE — Progress Notes (Signed)
Inpatient Rehab Admissions:  Inpatient Rehab Consult received.  Note pt mobilizing with min assist, and therapy recommendations are for home health for follow up.  Would not get [facility] approval for CIR.  Will sign off at this time.    Signed: Shann Medal, PT, DPT Admissions Coordinator [phone] 09/19/20  12:00 PM

## 2020-09-19 NOTE — Discharge Summary (Signed)
Physician Discharge Summary  Patient ID: Danielle Atkins MRN: [id number] DOB/AGE: 83-13-1936 83 y.o.  Admit date: 09/13/2020 Discharge date: 10/05/2020  Admission Diagnoses:  Discharge Diagnoses:  Active Problems:   Aortic aneurysm [facility])   Aortic dissection (HCC)   Right heart failure [facility])   Patient Active Problem List   Diagnosis Date Noted  . CKD (chronic kidney disease), stage III ([place])   . Diabetes mellitus ([place])   . Former tobacco use   . HTN (hypertension)   . Hyperlipidemia   . RBBB   . TIA (transient ischemic attack)   . Upper GI bleed   . Right heart failure ([place]) 09/20/2020  . Aortic dissection ([place]) 09/14/2020  . Aortic aneurysm ([place]) 09/13/2020  . Piriformis syndrome, left 09/29/2019  . CKD (chronic kidney disease) stage 3, GFR 30-59 ml/min (HCC) 09/01/2019  . Diabetes mellitus with renal complications ([place]) 85/27/7824  . GOUT 11/16/2009  . PARESTHESIA, HANDS 03/27/2009  . Pure hypercholesterolemia 06/25/2007  . Microcytic anemia 06/25/2007  . Essential hypertension 06/25/2007  . PUD (peptic ulcer disease) 06/25/2007  . Disorder resulting from impaired renal function 06/25/2007  . History of cardiovascular disorder 06/25/2007  . ANEMIA-NOS 06/25/2007   History of Present Illness:  Patient presents approximately 6:30 [facility] with a CT scan done earlier today with obvious type I aortic dissection.  The patient notes that she came to the [facility] emergency room on Tuesday having chest discomfort and spent 9 hours-the patient was never adequately evaluated nor treated in the emergency room.  Ultimately she left because she knew she had a previous appointment with her primary care doctor this morning.  From her primary care doctor visit this morning at 9 AM she was sent back to the emergency room and spent an additional 7 hours waiting for a diagnosis.  2 CT scans were done 1 without contrast which ultimately revealed a Type 1 Aortic Dissection.  The  patient has known renal insufficiency,  and she has made no urine since she was in the emergency room for the past 7 hours creatinine is 2.0  She has no previous cardiac history, does have a history of left hemispheric TIA manifested by slurred speech while reading a book several years ago-this is not reoccurred.  She was evaluated by Dr. Servando Snare who recommended emergent surgical intervention.  The risks and benefits of the procedure were explained to the patient and she was agreeable to proceed.    Discharged Condition: good  Hospital Course  The patient was taken emergently to the OR on 09/13/2020.  She underwent Median Sternotomy with Replacement of Ascending Aorta under deep hypothermic circulatory arrest.  She tolerated the procedure and was taken to the SICU in stable but critical condition.  The patient was coagulopathic and required transfusion of multiple units of blood products.  The patient required inotropic support with Neo, Esmolol, and Milrinone.  We are assisted throughout the postoperative course by the advanced heart failure team who assisted with medication adjustments as well as further diagnostic evaluation.  These were weaned as hemodynamics allowed.  Her primary heart failure issues RV failure/post cardiotomy shock.  He also developed acute kidney injury on top of chronic stage III kidney disease.  Her creatinine has improved over time and on 09/28/2020 it was 1.15.  She does also have an expected acute blood loss anemia which is stabilized.  Her most recent hemoglobin hematocrit dated 09/27/2020 were 9.2 and 27.0.  She was mobilized and made fair progress  with ambulation.    She was transferred to [facility] on 09/22/20. The IV milrinone was continued for RV failure. A VQ scan was done and ruled out PE. Cardiac MRI was scheduled for 09/24/20 and showed (see below).  Initially she had a right heart cath as well as a CT scan of the chest.  These reports are also listed below.   Overall she has made very steady progress in her physical and is not felt to be a good candidate for CIR due to ambulating 150 feet with minimal guarding and assistance.  Oxygen is progressively being weaned over time.  She is currently on 3 L by nasal cannula.  She has a very supportive family but unfortunately none are local and we are attempting to make further disposition arrangements for rehabilitation as she is currently not safe to drive long distances to stay with family.  When a bed becomes available at skilled nursing facility she will be discharged.   Consults: cardiology  Significant Diagnostic Studies:    ECHOCARDIOGRAM LIMITED REPORT       Patient Name:  Danielle Atkins Date of Exam: 09/19/2020  Medical Rec #: [id number]      Height:    64.5 in  Accession #:  [id number]     Weight:    154.1 lb  Date of Birth: 20-Feb-1935      BSA:     1.761 m  Patient Age:  85 years      BP:      108/61 mmHg  Patient Gender: F          HR:      97 bpm.  Exam Location: Inpatient   Procedure: Limited Echo, Limited Color Doppler and Cardiac Doppler   Indications:  Pericardial Effusion I31.3; S/P THORACIC ASCENDING  ANEURYSM         REPAIR    History:    Patient has prior history of Echocardiogram examinations,  most         recent 09/13/2020. Risk Factors:Hypertension, Dyslipidemia  and         Diabetes. - Aorta Dissection, Graft and Repair;  Date:09/13/2020         Graft Type: THORACIC ASCENDING ANEURYSM REPAIR (AAA) USING         HEMASHIELD PLATINUM 32MM x 30CM WOVEN DOUBLE VELOUR GRAFT.    Sonographer:  Mikki Santee RDCS (AE)  Referring Phys: [place]    1. Abnormal septal motion . Left ventricular ejection fraction, by  estimation, is 50 to 55%. The left ventricle has low normal function.  2. Right ventricular systolic function is  severely reduced. The right  ventricular size is severely enlarged.  3. Left atrial size was mildly dilated.  4. Right atrial size was mildly dilated.  5. Moderate mitral annular calcification.  6. Tricuspid valve regurgitation is moderate.  7. The aortic valve is abnormal. Aortic valve regurgitation is mild. Mild  to moderate aortic valve sclerosis/calcification is present, without any  evidence of aortic stenosis.  8. Normal diameter previous graft repair.   FINDINGS  Left Ventricle: Abnormal septal motion. Left ventricular ejection  fraction, by estimation, is 50 to 55%. The left ventricle has low normal  function.   Right Ventricle: The right ventricular size is severely enlarged. Right  vetricular wall thickness was not assessed. Right ventricular systolic  function is severely reduced.   Left Atrium: Left atrial size was mildly dilated.   Right Atrium: Right atrial  size was mildly dilated.   Pericardium: There is no evidence of pericardial effusion.   Mitral Valve: There is mild thickening of the mitral valve leaflet(s).  There is mild calcification of the mitral valve leaflet(s). Moderate  mitral annular calcification.   Tricuspid Valve: Tricuspid valve regurgitation is moderate.   Aortic Valve: The aortic valve is abnormal. Aortic valve regurgitation is  mild. Aortic regurgitation PHT measures 597 msec. Mild to moderate aortic  valve sclerosis/calcification is present, without any evidence of aortic  stenosis.   Aorta: Normal diameter previous graft repair.   Venous: The inferior vena cava was not well visualized.   IAS/Shunts: The interatrial septum was not well visualized.   AORTIC VALVE  LVOT Vmax:  90.50 cm/s  LVOT Vmean: 56.600 cm/s  LVOT VTI:  0.162 m  AI PHT:   597 msec   TRICUSPID VALVE  TR Peak grad:  43.0 mmHg  TR Vmax:    328.00 cm/s    SHUNTS  Systemic VTI: 0.16 m   Jenkins Rouge MD  Electronically signed by Jenkins Rouge  MD  Signature Date/Time: 09/19/2020/2:09:32 PM   DG Chest 1 View  Result Date: 10/04/2020 CLINICAL DATA:  Shortness of breath. Status post recent thoracic aortic aneurysm repair. EXAM: CHEST  1 VIEW COMPARISON:  Chest radiograph 09/26/2020 and CT 09/27/2020 FINDINGS: Postoperative changes are noted from thoracic aortic aneurysm repair. A left PICC terminates near the superior cavoatrial junction, unchanged. The cardiac silhouette remains enlarged. Lung volumes are low. Fine interstitial densities throughout both lungs are stable to slightly improved. Asymmetric left basilar airspace opacity has decreased. There may be a persistent small left pleural effusion. No pneumothorax is identified. IMPRESSION: 1. Stable to slightly improved appearance of bilateral interstitial densities which could reflect mild edema superimposed on chronic lung disease. 2. Mildly improved left basilar opacity, likely atelectasis. Electronically Signed   By: Logan Bores M.D.   On: 10/04/2020 11:21    Treatments: surgery:  OPERATIVE REPORT  DATE OF PROCEDURE:  09/13/2020  PREOPERATIVE DIAGNOSIS:  Acute type 1 aortic dissection of at least 24 hours duration.  POSTOPERATIVE DIAGNOSIS:  Acute type 1 aortic dissection of at least 24 hours duration.  SURGICAL PROCEDURE:  Emergency repair of type 1 aortic dissection with replacement of ascending aorta with 32 mm Hemashield graft with resuspension of the aortic valve with circulatory arrest, hypothermic.  SURGEON:  Lanelle Bal, MD  FIRST ASSISTANT:  Lars Pinks, PA  Discharge Exam: Blood pressure 108/65, pulse 99, temperature (!) 97.5 F (36.4 C), temperature source Oral, resp. rate 17, height 5' 4.5" (1.638 m), weight 68.8 kg, SpO2 95 %.  General appearance: alert, cooperative and no distress Heart: regular rate and rhythm Lungs: mildly dim in bases Abdomen: benign Extremities: no edema Wound: incis healing well Disposition: Discharge disposition:  03-Skilled [place]       Discharge Instructions    Discharge patient   Complete by: As directed    Discharge disposition: 03-Skilled [place]   Discharge patient date: 10/05/2020     Allergies as of 10/05/2020   No Known Allergies     Medication List    STOP taking these medications   amLODipine 5 MG tablet Commonly known as: NORVASC   aspirin 81 MG tablet Replaced by: aspirin 325 MG EC tablet   diclofenac 50 MG EC tablet Commonly known as: VOLTAREN   ondansetron 4 MG disintegrating tablet Commonly known as: Zofran ODT     TAKE these medications   albuterol 108 (90  Base) MCG/ACT inhaler Commonly known as: ProAir HFA Inhale 2 puffs into the lungs every 6 (six) hours as needed for wheezing.   allopurinol 300 MG tablet Commonly known as: ZYLOPRIM Take 1 tablet (300 mg total) by mouth daily.   aspirin 325 MG EC tablet Take 1 tablet (325 mg total) by mouth daily. Replaces: aspirin 81 MG tablet   bisoprolol 5 MG tablet Commonly known as: ZEBETA Take 0.5 tablets (2.5 mg total) by mouth daily.   cholecalciferol 25 MCG (1000 UNIT) tablet Commonly known as: VITAMIN D3 Take 5,000 Units by mouth daily.   digoxin 0.125 MG tablet Commonly known as: LANOXIN Take 1 tablet (0.125 mg total) by mouth daily.   famotidine 40 MG tablet Commonly known as: PEPCID Take 1 tablet (40 mg total) by mouth daily.   furosemide 40 MG tablet Commonly known as: LASIX Take 1 tablet (40 mg total) by mouth daily.   latanoprost 0.005 % ophthalmic solution Commonly known as: XALATAN 1 drop daily.   levothyroxine 25 MCG tablet Commonly known as: SYNTHROID Take 1 tablet (25 mcg total) by mouth in the morning. Start taking on: October 06, 2020 What changed:   medication strength  See the new instructions.   metFORMIN 500 MG tablet Commonly known as: GLUCOPHAGE Take 1 tablet (500 mg total) by mouth daily with breakfast. What changed: See the new  instructions.   potassium chloride SA 20 MEQ tablet Commonly known as: KLOR-CON Take 1 tablet (20 mEq total) by mouth daily.   simvastatin 40 MG tablet Commonly known as: ZOCOR Take 1 tablet (40 mg total) by mouth daily.   traMADol 50 MG tablet Commonly known as: ULTRAM Take 1 tablet (50 mg total) by mouth every 6 (six) hours as needed for up to 7 days for moderate pain.   vitamin C 100 MG tablet Take 1,000 mg by mouth daily. Takes C Complex            Durable Medical Equipment  (From admission, onward)         Start     Ordered   09/26/20 1439  For home use only DME Walker rolling  Once       Question Answer Comment  Walker: With [place] Wheels   Patient needs a walker to treat with the following condition Weakness      09/26/20 1439          Contact information for follow-up providers    [name], MD Follow up.   Specialty: Cardiothoracic Surgery Contact information: [address] [place] [address] [phone]        Revankar, Reita Cliche, MD Follow up.   Specialty: Cardiology Why: You have a New Patient Visit with Dr. Geraldo Pitter (Cardiology) scheduled for 10/01/2020 at 2:40pm. Please arrive 15 minutes early for check-in. If this date/time does not work for you, please call our office to reschedule. Contact information: [place] [address] [phone]        [facility] Follow up.   Why: HHPT Contact information: [place] [address] [phone]        [facility] Follow up.   Why: rolling walker Contact information: [address] [place] [address] [facility] Follow up on 10/10/2020.   Specialty: Cardiology Why: [facility] 10:00 AM [place] Code [phone]  Contact information: [address] [id number] [place] [phone]            Contact information for after-discharge care    Destination    [facility] .   Service: Skilled Nursing Contact information: [address] [place] [phone]                 1. Please obtain vital signs at least one time daily 2.Please weigh the patient daily. If he or she continues to gain weight or develops lower extremity edema, contact the office at [phone]. 3. Ambulate patient at least three times daily and please use sternal precautions. 4 keep on O2 at 3 liters, wean as able for sats >90%   Signed: John Giovanni PA-C 10/05/2020, 7:23 AM

## 2020-09-19 NOTE — Progress Notes (Signed)
Nutrition Follow-up  DOCUMENTATION CODES:   Not applicable  INTERVENTION:  - Ensure Enlive po BID, each supplement provides 350 kcal and 20 grams of protein  - Encourage adequate PO intake  NUTRITION DIAGNOSIS:   Inadequate oral intake related to decreased appetite as evidenced by meal completion < 50%, per patient/family report.  Progressing, pt now on Heart Healthy diet  GOAL:   Patient will meet greater than or equal to 90% of their needs  Progressing  MONITOR:   PO intake, Supplement acceptance, Labs, Weight trends, Skin, I & O's  REASON FOR ASSESSMENT:   Ventilator    ASSESSMENT:   85 year old female who presented to the ED on 9/30 with SOB. PMH of gout, PUD, HTN, anemia, TIA. Pt found to have type I aortic dissection.  09/30 - s/p thoracic ascending aneurysm repair 10/02 - self-extubated, clear liquid diet 10/03 - NPO 10/05 - clear liquid diet, later Heart Healthy diet  Discussed pt with RN and during [name] rounds. Pt ate fairly well at breakfast this morning. Meal completion charted as 50%.  Spoke with pt and daughter at bedside. Pt reports appetite is slowly improving. Pt denies nausea but reports she did have some abdominal pain earlier due to coughing. Lunch meal tray at bedside and daughter to assist pt with lunch. Pt states that she does not like the Boost Breeze but is willing to try the Ensure Enlive. RD provided an Ensure to pt at time of visit and will order BID.  Pt's daughter reports that pt does not eat much at meals at baseline and prefers to snack at nighttime (example: Oreos). Pt and daughter deny any recent changes in pt's weight.  Admit weight: 73.9 kg Current weight: 69.9 kg  Weight down slightly since admission. Will continue to monitor trends.  Discussed with pt and daughter the importance of adequate PO intake in post-op healing. Pt and daughter express understanding.  Meal Completion: 25-50%  Medications reviewed and include:  dulcolax, colace, Boost Breeze TID, SSI q 4 hours, protonix, lasix gtt, milrinone gtt  Labs reviewed: BUN 25, creatinine 1.35, HDL 23, hemoglobin 8.9 CBG's: 114-167 x 24 hours  UOP: 1530 ml x 24 hours I/O's: +510 ml since admit  Diet Order:   Diet Order            Diet Heart Room service appropriate? Yes; Fluid consistency: Thin  Diet effective now                 EDUCATION NEEDS:   Education needs have been addressed  Skin:  Skin Assessment: Skin Integrity Issues: Incisions: chest x 2  Last BM:  09/18/20  Height:   Ht Readings from Last 1 Encounters:  09/13/20 5' 4.5" (1.638 m)    Weight:   Wt Readings from Last 1 Encounters:  09/19/20 69.9 kg    Ideal Body Weight:     BMI:  Body mass index is 26.04 kg/m.  Estimated Nutritional Needs:   Kcal:  1700-1900  Protein:  90-110 grams  Fluid:  1.7-1.9 L    Gaynell Face, MS, RD, LDN Inpatient Clinical Dietitian Please see AMiON for contact information.

## 2020-09-20 ENCOUNTER — Inpatient Hospital Stay (HOSPITAL_COMMUNITY): Payer: Medicare HMO

## 2020-09-20 DIAGNOSIS — I50810 Right heart failure, unspecified: Secondary | ICD-10-CM

## 2020-09-20 DIAGNOSIS — R570 Cardiogenic shock: Secondary | ICD-10-CM | POA: Diagnosis not present

## 2020-09-20 DIAGNOSIS — I50811 Acute right heart failure: Secondary | ICD-10-CM

## 2020-09-20 DIAGNOSIS — I7100 Dissection of unspecified site of aorta: Secondary | ICD-10-CM | POA: Diagnosis not present

## 2020-09-20 HISTORY — DX: Right heart failure, unspecified: I50.810

## 2020-09-20 LAB — BASIC METABOLIC PANEL
Anion gap: 10 (ref 5–15)
BUN: 20 mg/dL (ref 8–23)
CO2: 28 mmol/L (ref 22–32)
Calcium: 8.2 mg/dL — ABNORMAL LOW (ref 8.9–10.3)
Chloride: 103 mmol/L (ref 98–111)
Creatinine, Ser: 1.22 mg/dL — ABNORMAL HIGH (ref 0.44–1.00)
GFR calc non Af Amer: 41 mL/min — ABNORMAL LOW (ref >60)
Glucose, Bld: 128 mg/dL — ABNORMAL HIGH (ref 70–99)
Potassium: 3.9 mmol/L (ref 3.5–5.1)
Sodium: 141 mmol/L (ref 135–145)

## 2020-09-20 LAB — GLUCOSE, CAPILLARY
Glucose-Capillary: 104 mg/dL — ABNORMAL HIGH (ref 70–99)
Glucose-Capillary: 105 mg/dL — ABNORMAL HIGH (ref 70–99)
Glucose-Capillary: 110 mg/dL — ABNORMAL HIGH (ref 70–99)
Glucose-Capillary: 139 mg/dL — ABNORMAL HIGH (ref 70–99)
Glucose-Capillary: 148 mg/dL — ABNORMAL HIGH (ref 70–99)
Glucose-Capillary: 86 mg/dL (ref 70–99)
Glucose-Capillary: 86 mg/dL (ref 70–99)

## 2020-09-20 LAB — COOXEMETRY PANEL
Carboxyhemoglobin: 1.5 % (ref 0.5–1.5)
Methemoglobin: 1 % (ref 0.0–1.5)
O2 Saturation: 59.3 %
Total hemoglobin: 9.4 g/dL — ABNORMAL LOW (ref 12.0–16.0)

## 2020-09-20 LAB — CBC
HCT: 28.5 % — ABNORMAL LOW (ref 36.0–46.0)
Hemoglobin: 8.5 g/dL — ABNORMAL LOW (ref 12.0–15.0)
MCH: 22.6 pg — ABNORMAL LOW (ref 26.0–34.0)
MCHC: 29.8 g/dL — ABNORMAL LOW (ref 30.0–36.0)
MCV: 75.8 fL — ABNORMAL LOW (ref 80.0–100.0)
Platelets: 293 10*3/uL (ref 150–400)
RBC: 3.76 MIL/uL — ABNORMAL LOW (ref 3.87–5.11)
RDW: 22.5 % — ABNORMAL HIGH (ref 11.5–15.5)
WBC: 11.3 10*3/uL — ABNORMAL HIGH (ref 4.0–10.5)
nRBC: 0.5 % — ABNORMAL HIGH (ref 0.0–0.2)

## 2020-09-20 LAB — MAGNESIUM: Magnesium: 1.7 mg/dL (ref 1.7–2.4)

## 2020-09-20 MED ORDER — POTASSIUM CHLORIDE CRYS ER 20 MEQ PO TBCR
40.0000 meq | EXTENDED_RELEASE_TABLET | Freq: Once | ORAL | Status: AC
  Administered 2020-09-20: 40 meq via ORAL
  Filled 2020-09-20: qty 2

## 2020-09-20 MED ORDER — CHLORHEXIDINE GLUCONATE 0.12 % MT SOLN
OROMUCOSAL | Status: AC
  Filled 2020-09-20: qty 15

## 2020-09-20 MED ORDER — MAGNESIUM SULFATE 2 GM/50ML IV SOLN
2.0000 g | Freq: Once | INTRAVENOUS | Status: AC
  Administered 2020-09-20: 2 g via INTRAVENOUS
  Filled 2020-09-20: qty 50

## 2020-09-20 MED ORDER — DIGOXIN 125 MCG PO TABS
0.1250 mg | ORAL_TABLET | Freq: Every day | ORAL | Status: DC
  Administered 2020-09-20 – 2020-10-05 (×16): 0.125 mg via ORAL
  Filled 2020-09-20 (×16): qty 1

## 2020-09-20 NOTE — Progress Notes (Signed)
DAILY PROGRESS NOTE   Patient Name: Danielle Atkins Date of Encounter: 09/20/2020 Cardiologist: Pixie Casino, MD  Chief Complaint   Fatigued  Patient Profile   Danielle Atkins is a 85 y.o. female with a hx of HTN, HLD, DM, prior TIA, gout, peptic ulcer disease, upper GIB, hypothyroidism, CKD stage III, RBBB, remote tobacco abuse who is being seen today for the evaluation of aortic dissection co-management at the request of Dr. Servando Snare.  Subjective   Echo yesterday shows no persistent pericardial effusion, however, LVEF is low normal at 50-55%, but there is severe RV systolic dysfunction. Left on milrinone d/t this. There is mild AI.  Co-ox is lower but stable around 57-59%. Creatinine continues to improve.  Objective   Vitals:   09/20/20 0410 09/20/20 0630 09/20/20 0700 09/20/20 0800  BP: 107/71 113/66    Pulse: (!) 110 (!) 111  (!) 108  Resp: 20 (!) 23  (!) 26  Temp:   97.6 F (36.4 C)   TempSrc:   Oral   SpO2: 94% 95%  92%  Weight:  77.7 kg    Height:        Intake/Output Summary (Last 24 hours) at 09/20/2020 0859 Last data filed at 09/20/2020 0800 Gross per 24 hour  Intake 773.93 ml  Output 1650 ml  Net -876.07 ml   Filed Weights   09/18/20 0500 09/19/20 0500 09/20/20 0630  Weight: 78.3 kg 69.9 kg 77.7 kg    Physical Exam   General appearance: alert and no distress Neck: no carotid bruit, no JVD and thyroid not enlarged, symmetric, no tenderness/mass/nodules Lungs: clear to auscultation bilaterally Heart: regular tachycardia Abdomen: soft, non-tender; bowel sounds normal; no masses,  no organomegaly Extremities: extremities normal, atraumatic, no cyanosis or edema Pulses: 2+ and symmetric Skin: Skin color, texture, turgor normal. No rashes or lesions Neurologic: Grossly normal Psych: Pleasant  Inpatient Medications    Scheduled Meds: . allopurinol  300 mg Oral Daily  . aspirin EC  325 mg Oral Daily   Or  . aspirin  324 mg Per Tube Daily   . bisacodyl  10 mg Oral Daily   Or  . bisacodyl  10 mg Rectal Daily  . chlorhexidine gluconate (MEDLINE KIT)  15 mL Mouth Rinse BID  . Chlorhexidine Gluconate Cloth  6 each Topical Daily  . docusate sodium  200 mg Oral Daily  . enoxaparin (LOVENOX) injection  30 mg Subcutaneous Q24H  . feeding supplement (ENSURE ENLIVE)  237 mL Oral BID BM  . insulin aspart  0-24 Units Subcutaneous Q4H  . latanoprost  1 drop Both Eyes QHS  . levothyroxine  25 mcg Oral q AM  . pantoprazole  40 mg Oral Daily  . simvastatin  40 mg Oral q1800  . sodium chloride flush  10-40 mL Intracatheter Q12H  . sodium chloride flush  3 mL Intravenous Q12H    Continuous Infusions: . sodium chloride Stopped (09/14/20 0800)  . furosemide (LASIX) infusion 6 mg/hr (09/20/20 0800)  . lactated ringers Stopped (09/15/20 1905)  . milrinone 0.125 mcg/kg/min (09/20/20 0800)    PRN Meds: dextrose, levalbuterol, lip balm, metoprolol tartrate, ondansetron (ZOFRAN) IV, oxyCODONE, sodium chloride flush, traMADol   Labs   Results for orders placed or performed during the hospital encounter of 09/13/20 (from the past 48 hour(s))  Glucose, capillary     Status: Abnormal   Collection Time: 09/18/20 11:40 AM  Result Value Ref Range   Glucose-Capillary 114 (H) 70 - 99 mg/dL  Comment: Glucose reference range applies only to samples taken after fasting for at least 8 hours.  Basic metabolic panel     Status: Abnormal   Collection Time: 09/18/20  4:05 PM  Result Value Ref Range   Sodium 142 135 - 145 mmol/L   Potassium 3.5 3.5 - 5.1 mmol/L   Chloride 104 98 - 111 mmol/L   CO2 27 22 - 32 mmol/L   Glucose, Bld 178 (H) 70 - 99 mg/dL    Comment: Glucose reference range applies only to samples taken after fasting for at least 8 hours.   BUN 27 (H) 8 - 23 mg/dL   Creatinine, Ser 1.38 (H) 0.44 - 1.00 mg/dL   Calcium 8.3 (L) 8.9 - 10.3 mg/dL   GFR calc non Af Amer 35 (L) >60 mL/min   Anion gap 11 5 - 15    Comment: Performed at  [facility] [address] [place], [place] [address]  Glucose, capillary     Status: Abnormal   Collection Time: 09/18/20  4:10 PM  Result Value Ref Range   Glucose-Capillary 152 (H) 70 - 99 mg/dL    Comment: Glucose reference range applies only to samples taken after fasting for at least 8 hours.  Glucose, capillary     Status: Abnormal   Collection Time: 09/18/20  8:05 PM  Result Value Ref Range   Glucose-Capillary 137 (H) 70 - 99 mg/dL    Comment: Glucose reference range applies only to samples taken after fasting for at least 8 hours.  Glucose, capillary     Status: Abnormal   Collection Time: 09/19/20 12:21 AM  Result Value Ref Range   Glucose-Capillary 115 (H) 70 - 99 mg/dL    Comment: Glucose reference range applies only to samples taken after fasting for at least 8 hours.  Glucose, capillary     Status: Abnormal   Collection Time: 09/19/20  3:50 AM  Result Value Ref Range   Glucose-Capillary 127 (H) 70 - 99 mg/dL    Comment: Glucose reference range applies only to samples taken after fasting for at least 8 hours.  Cooxemetry Panel (carboxy, met, total hgb, O2 sat)     Status: Abnormal   Collection Time: 09/19/20  4:27 AM  Result Value Ref Range   Total hemoglobin 10.9 (L) 12.0 - 16.0 g/dL   O2 Saturation 57.7 %   Carboxyhemoglobin 1.4 0.5 - 1.5 %   Methemoglobin 0.8 0.0 - 1.5 %    Comment: Performed at [facility], [place] [address] [place], [place] [address]  Basic metabolic panel     Status: Abnormal   Collection Time: 09/19/20  4:27 AM  Result Value Ref Range   Sodium 143 135 - 145 mmol/L   Potassium 3.6 3.5 - 5.1 mmol/L   Chloride 106 98 - 111 mmol/L   CO2 27 22 - 32 mmol/L   Glucose, Bld 149 (H) 70 - 99 mg/dL    Comment: Glucose reference range applies only to samples taken after fasting for at least 8 hours.   BUN 25 (H) 8 - 23 mg/dL   Creatinine, Ser 1.35 (H) 0.44 - 1.00 mg/dL   Calcium 8.1 (L) 8.9 - 10.3 mg/dL   GFR calc non Af Amer 36 (L) >60  mL/min   Anion gap 10 5 - 15    Comment: Performed at [facility] [address], [place], [place] [address]  CBC     Status: Abnormal  Collection Time: 09/19/20  4:27 AM  Result Value Ref Range   WBC 11.6 (H) 4.0 - 10.5 K/uL   RBC 3.89 3.87 - 5.11 MIL/uL   Hemoglobin 8.9 (L) 12.0 - 15.0 g/dL    Comment: Reticulocyte Hemoglobin testing may be clinically indicated, consider ordering this additional test [id number]    HCT 29.2 (L) 36 - 46 %   MCV 75.1 (L) 80.0 - 100.0 fL   MCH 22.9 (L) 26.0 - 34.0 pg   MCHC 30.5 30.0 - 36.0 g/dL   RDW 22.2 (H) 11.5 - 15.5 %   Platelets 257 150 - 400 K/uL    Comment: REPEATED TO VERIFY   nRBC 0.6 (H) 0.0 - 0.2 %    Comment: Performed at [facility] [address], [place], [place] [address]  Hepatic function panel     Status: Abnormal   Collection Time: 09/19/20  4:27 AM  Result Value Ref Range   Total Protein 6.4 (L) 6.5 - 8.1 g/dL   Albumin 2.4 (L) 3.5 - 5.0 g/dL   AST 82 (H) 15 - 41 U/L   ALT 43 0 - 44 U/L   Alkaline Phosphatase 68 38 - 126 U/L   Total Bilirubin 1.3 (H) 0.3 - 1.2 mg/dL   Bilirubin, Direct 0.4 (H) 0.0 - 0.2 mg/dL   Indirect Bilirubin 0.9 0.3 - 0.9 mg/dL    Comment: Performed at [facility] [address] [place], [place] [address]  Magnesium     Status: None   Collection Time: 09/19/20  4:27 AM  Result Value Ref Range   Magnesium 1.7 1.7 - 2.4 mg/dL    Comment: Performed at [facility] [address], [place], [place] [address]  TSH     Status: Abnormal   Collection Time: 09/19/20  4:27 AM  Result Value Ref Range   TSH 4.607 (H) 0.350 - 4.500 uIU/mL    Comment: Performed by a 3rd Generation assay with a functional sensitivity of <=0.01 uIU/mL. Performed at [facility], [place] [address], [place], [place] [address]   Lipid panel     Status: Abnormal   Collection Time: 09/19/20  4:27 AM  Result Value Ref Range   Cholesterol 86 0 - 200 mg/dL   Triglycerides 96 <150 mg/dL   HDL 23 (L)  >40 mg/dL   Total CHOL/HDL Ratio 3.7 RATIO   VLDL 19 0 - 40 mg/dL   LDL Cholesterol 44 0 - 99 mg/dL    Comment:        Total Cholesterol/HDL:CHD Risk Coronary Heart Disease Risk Table                     Men   Women  1/2 Average Risk   3.4   3.3  Average Risk       5.0   4.4  2 X Average Risk   9.6   7.1  3 X Average Risk  23.4   11.0        Use the calculated Patient Ratio above and the CHD Risk Table to determine the patient's CHD Risk.        ATP III CLASSIFICATION (LDL):  <100     mg/dL   Optimal  100-129  mg/dL   Near or Above                    Optimal  130-159  mg/dL   Borderline  160-189  mg/dL   High  >190     mg/dL   Very High Performed at [facility] [address], [place], [place] [address]   Glucose, capillary     Status: Abnormal   Collection Time: 09/19/20 10:03 AM  Result Value Ref Range   Glucose-Capillary 167 (H) 70 - 99 mg/dL    Comment: Glucose reference range applies only to samples taken after fasting for at least 8 hours.  Glucose, capillary     Status: Abnormal   Collection Time: 09/19/20 11:32 AM  Result Value Ref Range   Glucose-Capillary 113 (H) 70 - 99 mg/dL    Comment: Glucose reference range applies only to samples taken after fasting for at least 8 hours.  Glucose, capillary     Status: Abnormal   Collection Time: 09/19/20  4:22 PM  Result Value Ref Range   Glucose-Capillary 125 (H) 70 - 99 mg/dL    Comment: Glucose reference range applies only to samples taken after fasting for at least 8 hours.  Glucose, capillary     Status: None   Collection Time: 09/19/20  8:28 PM  Result Value Ref Range   Glucose-Capillary 81 70 - 99 mg/dL    Comment: Glucose reference range applies only to samples taken after fasting for at least 8 hours.  Glucose, capillary     Status: Abnormal   Collection Time: 09/19/20 11:56 PM  Result Value Ref Range   Glucose-Capillary 104 (H) 70 - 99 mg/dL    Comment: Glucose reference range applies only to  samples taken after fasting for at least 8 hours.  Glucose, capillary     Status: Abnormal   Collection Time: 09/20/20  3:53 AM  Result Value Ref Range   Glucose-Capillary 105 (H) 70 - 99 mg/dL    Comment: Glucose reference range applies only to samples taken after fasting for at least 8 hours.  Cooxemetry Panel (carboxy, met, total hgb, O2 sat)     Status: Abnormal   Collection Time: 09/20/20  4:07 AM  Result Value Ref Range   Total hemoglobin 9.4 (L) 12.0 - 16.0 g/dL   O2 Saturation 59.3 %   Carboxyhemoglobin 1.5 0.5 - 1.5 %   Methemoglobin 1.0 0.0 - 1.5 %    Comment: Performed at [facility], [place] [address] [place], [place] [address]  Magnesium     Status: None   Collection Time: 09/20/20  4:07 AM  Result Value Ref Range   Magnesium 1.7 1.7 - 2.4 mg/dL    Comment: Performed at [facility] [address] [place], [place] [address]  Basic metabolic panel     Status: Abnormal   Collection Time: 09/20/20  4:07 AM  Result Value Ref Range   Sodium 141 135 - 145 mmol/L   Potassium 3.9 3.5 - 5.1 mmol/L   Chloride 103 98 - 111 mmol/L   CO2 28 22 - 32 mmol/L   Glucose, Bld 128 (H) 70 - 99 mg/dL    Comment: Glucose reference range applies only to samples taken after fasting for at least 8 hours.   BUN 20 8 - 23 mg/dL   Creatinine, Ser 1.22 (H) 0.44 - 1.00 mg/dL   Calcium 8.2 (L) 8.9 - 10.3 mg/dL   GFR calc non Af Amer 41 (L) >60 mL/min   Anion gap 10 5 - 15    Comment: Performed at [facility] [address], [place], [place] [address]  CBC  Status: Abnormal   Collection Time: 09/20/20  4:07 AM  Result Value Ref Range   WBC 11.3 (H) 4.0 - 10.5 K/uL   RBC 3.76 (L) 3.87 - 5.11 MIL/uL   Hemoglobin 8.5 (L) 12.0 - 15.0 g/dL    Comment: Reticulocyte Hemoglobin testing may be clinically indicated, consider ordering this additional test [id number]    HCT 28.5 (L) 36 - 46 %   MCV 75.8 (L) 80.0 - 100.0 fL   MCH 22.6 (L) 26.0 - 34.0 pg   MCHC 29.8 (L) 30.0 -  36.0 g/dL   RDW 22.5 (H) 11.5 - 15.5 %   Platelets 293 150 - 400 K/uL   nRBC 0.5 (H) 0.0 - 0.2 %    Comment: Performed at [facility], [place] [address] [place], [place] [address]  Glucose, capillary     Status: Abnormal   Collection Time: 09/20/20  6:52 AM  Result Value Ref Range   Glucose-Capillary 110 (H) 70 - 99 mg/dL    Comment: Glucose reference range applies only to samples taken after fasting for at least 8 hours.    ECG   N/A  Telemetry   Sinus tachycardia - Personally Reviewed  Radiology    DG Chest Port 1 View  Result Date: 09/20/2020 CLINICAL DATA:  Sore chest.  Open-heart surgery. EXAM: PORTABLE CHEST 1 VIEW COMPARISON:  09/19/2020. FINDINGS: Left PICC line in stable position. Prior median sternotomy. Cardiomegaly. Diffuse bilateral pulmonary infiltrates/edema and bilateral pleural effusions again noted. No interim change. No pneumothorax. IMPRESSION: Prior median sternotomy. Cardiomegaly with diffuse bilateral pulmonary infiltrates/edema and bilateral pleural effusions again noted. No interim change. Electronically Signed   By: Marcello Moores  Register   On: 09/20/2020 07:43   DG Chest Port 1 View  Result Date: 09/19/2020 CLINICAL DATA:  Chest pain.  Recent thoracic aortic aneurysm repair EXAM: PORTABLE CHEST 1 VIEW COMPARISON:  September 18, 2020. FINDINGS: Cardiomegaly is stable. Mediastinal contours are stable compared to 1 day prior. Pulmonary vascularity is within normal limits. There are pleural effusions bilaterally with interstitial edema throughout the lungs bilaterally. There is bibasilar atelectasis. There is no frank airspace consolidation. Central catheter tip is at the cavoatrial junction. No appreciable pneumothorax. No bone lesions. IMPRESSION: Stable cardiomediastinal silhouette. Cardiomegaly in particular is stable. There is interstitial edema with pleural effusions bilaterally. Suspect a degree of congestive heart failure. Stable central catheter position.  Electronically Signed   By: Lowella Grip III M.D.   On: 09/19/2020 08:18   ECHOCARDIOGRAM LIMITED  Result Date: 09/19/2020    ECHOCARDIOGRAM LIMITED REPORT   Patient Name:   CAMBREY LUPI Coven Date of Exam: 09/19/2020 Medical Rec #:  [id number]           Height:       64.5 in Accession #:    [id number]          Weight:       154.1 lb Date of Birth:  [birth year]            BSA:          1.761 m Patient Age:    30 years            BP:           108/61 mmHg Patient Gender: F                   HR:           97 bpm. Exam Location:  Inpatient Procedure: Limited Echo,  Limited Color Doppler and Cardiac Doppler Indications:    Pericardial Effusion I31.3; S/P THORACIC ASCENDING ANEURYSM                 REPAIR  History:        Patient has prior history of Echocardiogram examinations, most                 recent 09/13/2020. Risk Factors:Hypertension, Dyslipidemia and                 Diabetes. - Aorta Dissection, Graft and Repair; Date:09/13/2020                 Graft Type: THORACIC ASCENDING ANEURYSM REPAIR (AAA) USING                 HEMASHIELD PLATINUM 32MM x 30CM WOVEN DOUBLE VELOUR GRAFT.  Sonographer:    Mikki Santee RDCS (AE) Referring Phys: [place]  1. Abnormal septal motion . Left ventricular ejection fraction, by estimation, is 50 to 55%. The left ventricle has low normal function.  2. Right ventricular systolic function is severely reduced. The right ventricular size is severely enlarged.  3. Left atrial size was mildly dilated.  4. Right atrial size was mildly dilated.  5. Moderate mitral annular calcification.  6. Tricuspid valve regurgitation is moderate.  7. The aortic valve is abnormal. Aortic valve regurgitation is mild. Mild to moderate aortic valve sclerosis/calcification is present, without any evidence of aortic stenosis.  8. Normal diameter previous graft repair. FINDINGS  Left Ventricle: Abnormal septal motion. Left ventricular ejection fraction, by estimation, is 50 to 55%.  The left ventricle has low normal function. Right Ventricle: The right ventricular size is severely enlarged. Right vetricular wall thickness was not assessed. Right ventricular systolic function is severely reduced. Left Atrium: Left atrial size was mildly dilated. Right Atrium: Right atrial size was mildly dilated. Pericardium: There is no evidence of pericardial effusion. Mitral Valve: There is mild thickening of the mitral valve leaflet(s). There is mild calcification of the mitral valve leaflet(s). Moderate mitral annular calcification. Tricuspid Valve: Tricuspid valve regurgitation is moderate. Aortic Valve: The aortic valve is abnormal. Aortic valve regurgitation is mild. Aortic regurgitation PHT measures 597 msec. Mild to moderate aortic valve sclerosis/calcification is present, without any evidence of aortic stenosis. Aorta: Normal diameter previous graft repair. Venous: The inferior vena cava was not well visualized. IAS/Shunts: The interatrial septum was not well visualized. AORTIC VALVE LVOT Vmax:   90.50 cm/s LVOT Vmean:  56.600 cm/s LVOT VTI:    0.162 m AI PHT:      597 msec TRICUSPID VALVE TR Peak grad:   43.0 mmHg TR Vmax:        328.00 cm/s  SHUNTS Systemic VTI: 0.16 m Jenkins Rouge MD Electronically signed by Jenkins Rouge MD Signature Date/Time: 09/19/2020/2:09:32 PM    Final     Cardiac Studies   See above  Assessment   Active Problems:   Aortic aneurysm (HCC)   Aortic dissection (HCC)   Right heart failure ([facility])   Plan   1. Noted to have severe RV dysfunction on echo - remains on low dose milrinone and IV lasix, diuresing with improved creatinine. Would continue. Hold off on BB until HR resolves. Continue current therapies.  Time Spent Directly with Patient:  I have spent a total of 35 minutes with the patient reviewing hospital notes, telemetry, EKGs, labs and examining the patient as well as establishing an assessment and plan  that was discussed personally with the patient.   > 50% of time was spent in direct patient care.  Length of Stay:  LOS: 7 days   Pixie Casino, MD, [facility], [place] Director of the Advanced Lipid Disorders &  Cardiovascular Risk Reduction Clinic Diplomate of the American Board of Clinical Lipidology Attending Cardiologist  Direct Dial: [phone]  Fax: [phone]  Website:  [email or web address] Ardith Lewman 09/20/2020, 8:59 AM

## 2020-09-20 NOTE — Progress Notes (Signed)
CT surgery p.m. Rounds  Patient stable on milrinone for postoperative RV dysfunction.  Patient evaluated by advanced heart failure for treatment of RV dysfunction noted on echocardiogram performed yesterday Blood pressure 103/64, pulse (!) 104, temperature 98 F (36.7 C), temperature source Oral, resp. rate 19, height 5' 4.5" (1.638 m), weight 77.7 kg, SpO2 94 %.

## 2020-09-20 NOTE — Progress Notes (Addendum)
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 WITH 32MM HEMASHIELD GRAFT, RESUSPENSION OF AORTIC VALVE AND HYPOTHERMIC  CIRCULATORY ARRST WITH CEREBRAL PREFUSION   (N/A)  Total Length of Stay:  LOS: 7 days   Subjective: Feels a little weaker today  Objective: Vital signs in last 24 hours: Temp:  [97.4 F (36.3 C)-98 F (36.7 C)] 97.6 F (36.4 C) (10/07 0700) Pulse Rate:  [85-111] 111 (10/07 0630) Cardiac Rhythm: Sinus tachycardia (10/07 0410) Resp:  [16-27] 23 (10/07 0630) BP: (103-113)/(63-77) 113/66 (10/07 0630) SpO2:  [90 %-98 %] 95 % (10/07 0630) Weight:  [77.7 kg] 77.7 kg (10/07 0630)  Filed Weights   09/18/20 0500 09/19/20 0500 09/20/20 0630  Weight: 78.3 kg 69.9 kg 77.7 kg    Weight change: 7.801 kg   Hemodynamic parameters for last 24 hours: CVP:  [6 mmHg-7 mmHg] 7 mmHg  Intake/Output from previous day: 10/06 0701 - 10/07 0700 In: 556.7 [P.O.:360; I.V.:196.7] Out: 1650 [Urine:1650]  Intake/Output this shift: No intake/output data recorded.  Current Meds: Scheduled Meds: . allopurinol  300 mg Oral Daily  . aspirin EC  325 mg Oral Daily   Or  . aspirin  324 mg Per Tube Daily  . bisacodyl  10 mg Oral Daily   Or  . bisacodyl  10 mg Rectal Daily  . chlorhexidine gluconate (MEDLINE KIT)  15 mL Mouth Rinse BID  . Chlorhexidine Gluconate Cloth  6 each Topical Daily  . docusate sodium  200 mg Oral Daily  . enoxaparin (LOVENOX) injection  30 mg Subcutaneous Q24H  . feeding supplement (ENSURE ENLIVE)  237 mL Oral BID BM  . insulin aspart  0-24 Units Subcutaneous Q4H  . latanoprost  1 drop Both Eyes QHS  . levothyroxine  25 mcg Oral q AM  . pantoprazole  40 mg Oral Daily  . simvastatin  40 mg Oral q1800  . sodium chloride flush  10-40 mL Intracatheter Q12H  . sodium chloride  flush  3 mL Intravenous Q12H   Continuous Infusions: . sodium chloride Stopped (09/14/20 0800)  . furosemide (LASIX) infusion 6 mg/hr (09/20/20 0600)  . lactated ringers Stopped (09/15/20 1905)  . milrinone 0.125 mcg/kg/min (09/20/20 0600)   PRN Meds:.dextrose, levalbuterol, lip balm, metoprolol tartrate, ondansetron (ZOFRAN) IV, oxyCODONE, sodium chloride flush, traMADol  General appearance: alert, cooperative and no distress Heart: regular rate and rhythm and tachy Lungs: clear to auscultation bilaterally Abdomen: benign Extremities: no edema Wound: incis healing well  Lab Results: CBC: Recent Labs    09/19/20 0427 09/20/20 0407  WBC 11.6* 11.3*  HGB 8.9* 8.5*  HCT 29.2* 28.5*  PLT 257 293   BMET:  Recent Labs    09/19/20 0427 09/20/20 0407  NA 143 141  K 3.6 3.9  CL 106 103  CO2 27 28  GLUCOSE 149* 128*  BUN 25* 20  CREATININE 1.35* 1.22*  CALCIUM 8.1* 8.2*    CMET: Lab Results  Component Value Date   WBC 11.3 (H) 09/20/2020   HGB 8.5 (L) 09/20/2020   HCT 28.5 (L) 09/20/2020   PLT 293 09/20/2020   GLUCOSE 128 (H) 09/20/2020   CHOL 86 09/19/2020  TRIG 96 09/19/2020   HDL 23 (L) 09/19/2020   LDLCALC 44 09/19/2020   ALT 43 09/19/2020   AST 82 (H) 09/19/2020   NA 141 09/20/2020   K 3.9 09/20/2020   CL 103 09/20/2020   CREATININE 1.22 (H) 09/20/2020   BUN 20 09/20/2020   CO2 28 09/20/2020   TSH 4.607 (H) 09/19/2020   INR 1.0 09/14/2020   HGBA1C 5.8 (H) 09/14/2020   MICROALBUR 5.9 (H) 10/01/2016      PT/INR: No results for input(s): LABPROT, INR in the last 72 hours. Radiology: ECHOCARDIOGRAM LIMITED  Result Date: 09/19/2020    ECHOCARDIOGRAM LIMITED REPORT   Patient Name:   Danielle Atkins Carradine Date of Exam: 09/19/2020 Medical Rec #:  [id number]           Height:       64.5 in Accession #:    [id number]          Weight:       154.1 lb Date of Birth:  01-Feb-1935            BSA:          1.761 m Patient Age:    85 years            BP:           108/61  mmHg Patient Gender: F                   HR:           97 bpm. Exam Location:  Inpatient Procedure: Limited Echo, Limited Color Doppler and Cardiac Doppler Indications:    Pericardial Effusion I31.3; S/P THORACIC ASCENDING ANEURYSM                 REPAIR  History:        Patient has prior history of Echocardiogram examinations, most                 recent 09/13/2020. Risk Factors:Hypertension, Dyslipidemia and                 Diabetes. - Aorta Dissection, Graft and Repair; Date:09/13/2020                 Graft Type: THORACIC ASCENDING ANEURYSM REPAIR (AAA) USING                 HEMASHIELD PLATINUM 32MM x 30CM WOVEN DOUBLE VELOUR GRAFT.  Sonographer:    Mikki Santee RDCS (AE) Referring Phys: [place]  1. Abnormal septal motion . Left ventricular ejection fraction, by estimation, is 50 to 55%. The left ventricle has low normal function.  2. Right ventricular systolic function is severely reduced. The right ventricular size is severely enlarged.  3. Left atrial size was mildly dilated.  4. Right atrial size was mildly dilated.  5. Moderate mitral annular calcification.  6. Tricuspid valve regurgitation is moderate.  7. The aortic valve is abnormal. Aortic valve regurgitation is mild. Mild to moderate aortic valve sclerosis/calcification is present, without any evidence of aortic stenosis.  8. Normal diameter previous graft repair. FINDINGS  Left Ventricle: Abnormal septal motion. Left ventricular ejection fraction, by estimation, is 50 to 55%. The left ventricle has low normal function. Right Ventricle: The right ventricular size is severely enlarged. Right vetricular wall thickness was not assessed. Right ventricular systolic function is severely reduced. Left Atrium: Left atrial size was mildly dilated. Right Atrium: Right atrial size was mildly dilated. Pericardium: There is  no evidence of pericardial effusion. Mitral Valve: There is mild thickening of the mitral valve leaflet(s). There is  mild calcification of the mitral valve leaflet(s). Moderate mitral annular calcification. Tricuspid Valve: Tricuspid valve regurgitation is moderate. Aortic Valve: The aortic valve is abnormal. Aortic valve regurgitation is mild. Aortic regurgitation PHT measures 597 msec. Mild to moderate aortic valve sclerosis/calcification is present, without any evidence of aortic stenosis. Aorta: Normal diameter previous graft repair. Venous: The inferior vena cava was not well visualized. IAS/Shunts: The interatrial septum was not well visualized. 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 Co-ox is 59 today 4 creat conts to improve, fair UOP 5 anemia is fairly stable 6 Leukocytosis is stable, very minor 7 magnesium 1.7 - replace 8 CXR appearance is stable, mildly improved ASD/pulm edema 9 cont to push rehab/pulm toilet/nutrition  Danielle Giovanni PA-C pager [phone]  09/20/2020 7:41 AM   Milrinone increased back to 0.125 with echo result of ECHO in regard to rv dilation and function  [facility]ox 59 Continue diuresis  Inpatient rehab turned - due to " [employer] will not approve" Family would like to take her to [place] at some point to recovery -  Check cvp and ask heart failure to see I have seen and examined [name] and agree with the above assessment  and plan.  [name]  MD Bee[facility] [phone] Office [phone] 09/20/2020 8:18 AM

## 2020-09-20 NOTE — Progress Notes (Signed)
Physical Therapy Treatment Patient Details Name: Danielle Atkins MRN: [id number] DOB: 17-Oct-1935 Today's Date: 09/20/2020    History of Present Illness 85 yo admitted with chest pain 9/27 with aortic dissection s/p thoracic aortic aneurysm repair on 9/30 who self extubated 10/2 with progressive respiratory failure requiring bipap. PMhx:HTN, HLD, upper GIB, DM, gout    PT Comments    Pt supine in bed and slightly drowsy after waking this am.  Pt continues to benefit from skilled rehab in the home setting,  She has a son who work part time around 4 hours a day who can assist her at home.  Pt is making good progress.  Plan for continued gt training and transition to use of RW away from EVA walker.     Follow Up Recommendations  Home health PT     Equipment Recommendations  Rolling walker with 5" wheels;3in1 (PT)    Recommendations for Other Services       Precautions / Restrictions Precautions Precautions: Sternal;Fall Precaution Booklet Issued: Yes (comment) Precaution Comments: watch sats Restrictions Weight Bearing Restrictions:  (sternal precautions)    Mobility  Bed Mobility Overal bed mobility: Needs Assistance Bed Mobility: Supine to Sit     Supine to sit: Min assist     General bed mobility comments: min assist to bring legs to surface and elevate trunk with pt holding pillow, cues for sequence and safety  Transfers Overall transfer level: Needs assistance Equipment used: None (EVA walker) Transfers: Sit to/from Stand Sit to Stand: Min assist         General transfer comment: cues for hand placement, assist to lean forward and rise from surface,  Pt attempting to reach outside of "tube" and required cues to correct.  Ambulation/Gait Ambulation/Gait assistance: Min guard Gait Distance (Feet): 120 Feet Assistive device:  (EVA walker) Gait Pattern/deviations: Decreased stride length;Trunk flexed;Step-through pattern;Narrow base of support     General  Gait Details: Cues for upper trunk control and increasing BPS.  Pt maintained O2 sats on 8L HF [place] and HR slightly (113bpm )elevated with activity.   Stairs             Wheelchair Mobility    Modified Rankin (Stroke Patients Only)       Balance Overall balance assessment: Mild deficits observed, not formally tested                                          Cognition Arousal/Alertness: Awake/alert Behavior During Therapy: WFL for tasks assessed/performed Overall Cognitive Status: Within Functional Limits for tasks assessed                                        Exercises Other Exercises Other Exercises: Incentive Spirometer: 250-556ml quality x 5 reps. Productive cough x1.    General Comments        Pertinent Vitals/Pain Pain Assessment: No/denies pain    Home Living                      Prior Function            PT Goals (current goals can now be found in the care plan section) Acute Rehab PT Goals Patient Stated Goal: return to gambling at the casino, travel Potential to Achieve Goals: Good  Progress towards PT goals: Progressing toward goals    Frequency    Min 3X/week      PT Plan Current plan remains appropriate    Co-evaluation              AM-PAC PT "6 Clicks" Mobility   Outcome Measure  Help needed turning from your back to your side while in a flat bed without using bedrails?: A Little Help needed moving from lying on your back to sitting on the side of a flat bed without using bedrails?: A Little Help needed moving to and from a bed to a chair (including a wheelchair)?: A Little Help needed standing up from a chair using your arms (e.g., wheelchair or bedside chair)?: A Little Help needed to walk in hospital room?: A Little Help needed climbing 3-5 steps with a railing? : A Little 6 Click Score: 18    End of Session Equipment Utilized During Treatment: Oxygen Activity Tolerance: Patient  tolerated treatment well Patient left: with call bell/phone within reach;in chair Nurse Communication: Mobility status PT Visit Diagnosis: Other abnormalities of gait and mobility (R26.89);Difficulty in walking, not elsewhere classified (R26.2)     Time: 1005-1035 PT Time Calculation (min) (ACUTE ONLY): 30 min  Charges:  $Gait Training: 8-22 mins $Therapeutic Activity: 8-22 mins                     Erasmo Leventhal , PTA Acute Rehabilitation Services Pager [phone] Office [phone]     Danielle Atkins 09/20/2020, 10:58 AM

## 2020-09-20 NOTE — Consult Note (Addendum)
Advanced Heart Failure Team Consult Note   Primary Physician: [name], [name], MD PCP-Cardiologist:  Pixie Casino, MD  Reason for Consultation: RV Failure / post cardiotomy shock  HPI:    [facility] is seen today for evaluation of RV failure/ post cardiotomy shock w/ difficulty weaning milrinone, at the request of Dr. Servando Snare, [facility].    85 y/o AAF w/ multiple CRs including T2DM, HTN and HLD as well as RBBB, Stage III CKD and prior TIA, admitted on 9/30 for acute Type 1 Aortic disection of at least 24 hrs duration. Underwent emergent surgical repair by Dr. Servando Snare. Intraoperative TEE demonstrated normal LVEF 50-55%. RV moderately enlarged w/ mildly reduced systolic function, moderate TR. There was also a moderate sized pericardial effusion that was drained. It appeared that the primary tear of the dissection was just above the left coronary ostium. The ostium of the right coronary artery was also intact. The aortic valve was trileaflet.  She has had difficulty weaning off milrinone. Co-ox dropped from 75>>58% yesterday w/ wean. Required dose titration back to 0.125 mcg/kg/min. Repeat limited echo was obtained. LVEF is unchanged at 50-55%, however RV is now severely enlarged w/ severely reduced systolic function. Moderate TR. RVSP not documented. No pericardial effusion seen.   She is OOB sitting up in chair. She feels tired, more so today than yesterday. BP stable 262M systolic. SCr trending down, 1.38>>1.35>>1.22. WBC 11.3, Hgb 8.5. She remains on high flow [place] w/ suboptimal O2 sats, in the low 90s. CXR today shows cardiomegaly w/ diffuse bilateral pulmonary infiltrates/edema and bilateral pleural effusions. She is currently on lasix gtt at 6 ml/hr. -1.7L in UOP yesterday. She is still ~8 lb above preop wt. CVP 13.   She tells me that prior to her acute dissection, she had notice gradual change in functional capacity over the last 6-8 months. She is an avid walker  and noticed increased dyspnea on exertion and inability to finish her exercise walks. Becoming progressively fatigue but no associated CP. No h/o COVID, to her knowledge. She is a former smoker but quit ~25 years ago. She has an albuterol inhaler that she has not had to use recently but no formal diagnosis of COPD. She has been told that she snores but has never had a sleep study. Her daughter has observed apneic spells during sleep. No h/o PE/DVT. Of note, she had a prior echo in 08/2010 that showed mildly dilated and moderately hypekinetic RV in apical 4-chamber views. LVEF 60-65% at that time. EKG shows RBBB w/ ? anterior infarct, age undetermined.    Limited Echo 09/19/20 Abnormal septal motion . Left ventricular ejection fraction, by estimation, is 50 to 55%. The left ventricle has low normal function. 2. Right ventricular systolic function is severely reduced. The right ventricular size is severely enlarged. 3. Left atrial size was mildly dilated. 4. Right atrial size was mildly dilated. 5. Moderate mitral annular calcification. 6. Tricuspid valve regurgitation is moderate. 7. The aortic valve is abnormal. Aortic valve regurgitation is mild. Mild to moderate aortic valve sclerosis/calcification is present, without any evidence of aortic stenosis. 8. Normal diameter previous graft repair.  Review of Systems: [y] = yes, '[ ]'  = no   . General: Weight gain '[ ]' ; Weight loss '[ ]' ; Anorexia '[ ]' ; Fatigue [ Y]; Fever '[ ]' ; Chills '[ ]' ; Weakness [Y]  . Cardiac: Chest pain/pressure '[ ]' ; Resting SOB '[ ]' ; Exertional SOB [Y ]; Orthopnea '[ ]' ; Pedal Edema '[ ]' ;  Palpitations '[ ]' ; Syncope '[ ]' ; Presyncope '[ ]' ; Paroxysmal nocturnal dyspnea'[ ]'   . Pulmonary: Cough '[ ]' ; Wheezing'[ ]' ; Hemoptysis'[ ]' ; Sputum '[ ]' ; Snoring '[ ]'   . GI: Vomiting'[ ]' ; Dysphagia'[ ]' ; Melena'[ ]' ; Hematochezia '[ ]' ; Heartburn'[ ]' ; Abdominal pain '[ ]' ; Constipation '[ ]' ; Diarrhea '[ ]' ; BRBPR '[ ]'   . GU: Hematuria'[ ]' ; Dysuria '[ ]' ; Nocturia'[ ]'   . Vascular:  Pain in legs with walking '[ ]' ; Pain in feet with lying flat '[ ]' ; Non-healing sores '[ ]' ; Stroke '[ ]' ; TIA '[ ]' ; Slurred speech '[ ]' ;  . Neuro: Headaches'[ ]' ; Vertigo'[ ]' ; Seizures'[ ]' ; Paresthesias'[ ]' ;Blurred vision '[ ]' ; Diplopia '[ ]' ; Vision changes '[ ]'   . Ortho/Skin: Arthritis '[ ]' ; Joint pain '[ ]' ; Muscle pain '[ ]' ; Joint swelling '[ ]' ; Back Pain '[ ]' ; Rash '[ ]'   . Psych: Depression'[ ]' ; Anxiety'[ ]'   . Heme: Bleeding problems '[ ]' ; Clotting disorders '[ ]' ; Anemia '[ ]'   . Endocrine: Diabetes '[ ]' ; Thyroid dysfunction'[ ]'   Home Medications Prior to Admission medications   Medication Sig Start Date End Date Taking? Authorizing Provider  albuterol (PROAIR HFA) 108 (90 Base) MCG/ACT inhaler Inhale 2 puffs into the lungs every 6 (six) hours as needed for wheezing. 12/28/18  Yes [name], MD  allopurinol (ZYLOPRIM) 300 MG tablet TAKE 1 TABLET EVERY DAY Patient taking differently: Take 300 mg by mouth daily.  07/27/20  Yes [name], [name], MD  amLODipine (NORVASC) 5 MG tablet TAKE 1 TABLET EVERY DAY Patient taking differently: Take 5 mg by mouth daily.  07/31/20  Yes [name], [name], MD  Ascorbic Acid (VITAMIN C) 100 MG tablet Take 1,000 mg by mouth daily. Takes C Complex   Yes [provider]  aspirin 81 MG tablet Take 1 tablet (81 mg total) by mouth daily. Patient taking differently: Take 325 mg by mouth daily.  06/29/14  Yes [name], MD  cholecalciferol (VITAMIN D3) 25 MCG (1000 UT) tablet Take 5,000 Units by mouth daily.    Yes [provider]  famotidine (PEPCID) 40 MG tablet TAKE 1 TABLET EVERY DAY Patient taking differently: Take 40 mg by mouth daily.  06/21/20  Yes [name], [name], MD  latanoprost (XALATAN) 0.005 % ophthalmic solution 1 drop daily. 06/28/20  Yes [provider]  levothyroxine (SYNTHROID) 50 MCG tablet TAKE 1/2 TABLET EVERY DAY BEFORE BREAKFAST (NEED MD APPOINTMENT) Patient taking differently: Take 25 mcg by mouth  daily before breakfast.  09/12/20  Yes [name], [name], MD  metFORMIN (GLUCOPHAGE) 500 MG tablet TAKE 1 TABLET EVERY DAY  WITH  BREAKFAST Patient taking differently: Take 500 mg by mouth daily with breakfast.  04/25/20  Yes [name], [name], MD  simvastatin (ZOCOR) 40 MG tablet TAKE 1 TABLET EVERY DAY Patient taking differently: Take 40 mg by mouth daily.  06/21/20  Yes [name], [name], MD  diclofenac (VOLTAREN) 50 MG EC tablet Take 1 tablet (50 mg total) by mouth 3 (three) times daily as needed. Take 1 tablet TID x 5 days then PRN for pain Patient not taking: Reported on 09/15/2020 09/22/19   Enrique Sack, FNP  ondansetron (ZOFRAN ODT) 4 MG disintegrating tablet Take 1 tablet (4 mg total) by mouth every 8 (eight) hours as needed for nausea or vomiting. Patient not taking: Reported on 09/15/2020 10/27/19   Chase Picket, MD    Past Medical History: Past Medical History:  Diagnosis Date  .  ANEMIA-NOS 06/25/2007  . CKD (chronic kidney disease), stage III ([place])   . Diabetes mellitus ([place])   . Former tobacco use   . GOUT 11/16/2009  . HTN (hypertension)   . Hyperlipidemia   . PARESTHESIA, HANDS 03/27/2009  . PEPTIC ULCER DISEASE 06/25/2007  . RBBB   . TIA (transient ischemic attack)   . Upper GI bleed     Past Surgical History: Past Surgical History:  Procedure Laterality Date  . THORACIC AORTIC ANEURYSM REPAIR N/A 09/13/2020   Procedure: TYPE 1 AORTIC DISSECTION WITH REPLACEMENT OF ASCENDING AORTA WITH 32MM HEMASHIELD GRAFT, RESUSPENSION OF AORTIC VALVE AND HYPOTHERMIC  CIRCULATORY ARRST WITH CEREBRAL PREFUSION  ;  Surgeon: [name], MD;  Location: [facility];  Service: Open Heart Surgery;  Laterality: N/A;    Family History: Family History  Problem Relation Age of Onset  . Cancer Mother     Social History: Social History   Socioeconomic History  . Marital status: Widowed    Spouse name: Not on file  . Number of children: Not on file  .  Years of education: Not on file  . Highest education level: Not on file  Occupational History  . Not on file  Tobacco Use  . Smoking status: Former Smoker    Quit date: 12/16/1999    Years since quitting: 20.7  . Smokeless tobacco: Never Used  Vaping Use  . Vaping Use: Never used  Substance and Sexual Activity  . Alcohol use: No  . Drug use: No  . Sexual activity: Not on file  Other Topics Concern  . Not on file  Social History Narrative  . Not on file   Social Determinants of Health   Financial Resource Strain: Low Risk   . Difficulty of Paying Living Expenses: Not hard at all  Food Insecurity:   . Worried About Charity fundraiser in the Last Year: Not on file  . Ran Out of Food in the Last Year: Not on file  Transportation Needs: No Transportation Needs  . Lack of Transportation (Medical): No  . Lack of Transportation (Non-Medical): No  Physical Activity:   . Days of Exercise per Week: Not on file  . Minutes of Exercise per Session: Not on file  Stress:   . Feeling of Stress : Not on file  Social Connections:   . Frequency of Communication with Friends and Family: Not on file  . Frequency of Social Gatherings with Friends and Family: Not on file  . Attends Religious Services: Not on file  . Active Member of Clubs or Organizations: Not on file  . Attends Archivist Meetings: Not on file  . Marital Status: Not on file    Allergies:  No Known Allergies  Objective:    Vital Signs:   Temp:  [97.4 F (36.3 C)-98 F (36.7 C)] 97.6 F (36.4 C) (10/07 0700) Pulse Rate:  [85-111] 108 (10/07 0800) Resp:  [16-27] 26 (10/07 0800) BP: (103-113)/(63-77) 113/66 (10/07 0630) SpO2:  [90 %-98 %] 92 % (10/07 0800) Weight:  [77.7 kg] 77.7 kg (10/07 0630) Last BM Date: 09/19/20  Weight change: Filed Weights   09/18/20 0500 09/19/20 0500 09/20/20 0630  Weight: 78.3 kg 69.9 kg 77.7 kg    Intake/Output:   Intake/Output Summary (Last 24 hours) at 09/20/2020  1011 Last data filed at 09/20/2020 0800 Gross per 24 hour  Intake 562.99 ml  Output 1850 ml  Net -1287.01 ml      Physical Exam  General:  Fatigue appearing. Look much younger than actual age on HFNC but no visible respiratory difficulty  HEENT: normal Neck: supple. JVP ~10 cm . Carotids 2+ bilat; no bruits. No lymphadenopathy or thyromegaly appreciated. Sternotomy site ok.  Cor: PMI nondisplaced. Regular rhythm, tachy rate. No rubs, gallops or murmurs. Lungs: decreased BS at the bases bilaterally  Abdomen: soft, nontender, nondistended. No hepatosplenomegaly. No bruits or masses. Good bowel sounds. Extremities: no cyanosis, clubbing, rash, trace bilateral LE edema Neuro: alert & orientedx3, cranial nerves grossly intact. moves all 4 extremities w/o difficulty. Affect pleasant   Telemetry   Sinus tach low 100s   EKG    Admit EKG 9/30 showed Sinus tach 107 bpm w/ RBBB   Labs   Basic Metabolic Panel: Recent Labs  Lab 09/14/20 0831 09/14/20 1112 09/15/20 1705 09/15/20 2136 09/16/20 2225 09/16/20 2225 09/17/20 0448 09/17/20 0448 09/18/20 0405 09/18/20 0405 09/18/20 1605 09/19/20 0427 09/20/20 0407  NA 141   < > 141   < > 143   < > 144  --  143  --  142 143 141  K 4.0   < > 4.2   < > 4.8   < > 3.9  --  3.3*  --  3.5 3.6 3.9  CL 106   < > 108   < > 108   < > 106  --  103  --  104 106 103  CO2 24   < > 22   < > 27   < > 28  --  29  --  '27 27 28  ' GLUCOSE 213*   < > 129*   < > 117*   < > 125*  --  138*  --  178* 149* 128*  BUN 25*   < > 29*   < > 27*   < > 27*  --  26*  --  27* 25* 20  CREATININE 1.66*   < > 1.57*   < > 1.43*   < > 1.45*  --  1.31*  --  1.38* 1.35* 1.22*  CALCIUM 7.9*   < > 7.8*   < > 7.5*   < > 7.8*   < > 7.8*   < > 8.3* 8.1* 8.2*  MG 2.4  --  2.1  --  2.0  --   --   --   --   --   --  1.7 1.7   < > = values in this interval not displayed.    Liver Function Tests: Recent Labs  Lab 09/13/20 1129 09/15/20 0323 09/18/20 0405 09/19/20 0427  AST  44* 64* 79* 82*  ALT 20 29 40 43  ALKPHOS 45 38 61 68  BILITOT 2.2* 3.5* 1.4* 1.3*  PROT 8.5* 5.6* 6.1* 6.4*  ALBUMIN 3.8 2.9* 2.5* 2.4*   No results for input(s): LIPASE, AMYLASE in the last 168 hours. No results for input(s): AMMONIA in the last 168 hours.  CBC: Recent Labs  Lab 09/13/20 1129 09/13/20 1913 09/15/20 1705 09/15/20 1705 09/15/20 2136 09/16/20 0335 09/18/20 0405 09/19/20 0427 09/20/20 0407  WBC 16.8*   < > 13.0*  --   --  14.3* 12.2* 11.6* 11.3*  NEUTROABS 13.5*  --   --   --   --   --   --   --   --   HGB 11.2*   < > 9.6*   < > 9.9* 9.1* 8.6* 8.9* 8.5*  HCT 36.7   < >  29.6*   < > 29.0* 29.4* 27.4* 29.2* 28.5*  MCV 69.4*   < > 73.4*  --   --  74.6* 74.9* 75.1* 75.8*  PLT 109*   < > 94*  --   --  104* 173 257 293   < > = values in this interval not displayed.    Cardiac Enzymes: No results for input(s): CKTOTAL, CKMB, CKMBINDEX, TROPONINI in the last 168 hours.  BNP: BNP (last 3 results) Recent Labs    09/13/20 1129  BNP 388.1*    ProBNP (last 3 results) No results for input(s): PROBNP in the last 8760 hours.   CBG: Recent Labs  Lab 09/19/20 1622 09/19/20 2028 09/19/20 2356 09/20/20 0353 09/20/20 0652  GLUCAP 125* 81 104* 105* 110*    Coagulation Studies: No results for input(s): LABPROT, INR in the last 72 hours.   Imaging   DG Chest Port 1 View  Result Date: 09/20/2020 CLINICAL DATA:  Sore chest.  Open-heart surgery. EXAM: PORTABLE CHEST 1 VIEW COMPARISON:  09/19/2020. FINDINGS: Left PICC line in stable position. Prior median sternotomy. Cardiomegaly. Diffuse bilateral pulmonary infiltrates/edema and bilateral pleural effusions again noted. No interim change. No pneumothorax. IMPRESSION: Prior median sternotomy. Cardiomegaly with diffuse bilateral pulmonary infiltrates/edema and bilateral pleural effusions again noted. No interim change. Electronically Signed   By: Marcello Moores  Register   On: 09/20/2020 07:43   ECHOCARDIOGRAM  LIMITED  Result Date: 09/19/2020    ECHOCARDIOGRAM LIMITED REPORT   Patient Name:   Danielle Atkins Date of Exam: 09/19/2020 Medical Rec #:  [id number]           Height:       64.5 in Accession #:    [id number]          Weight:       154.1 lb Date of Birth:  1935/07/29            BSA:          1.761 m Patient Age:    13 years            BP:           108/61 mmHg Patient Gender: F                   HR:           97 bpm. Exam Location:  Inpatient Procedure: Limited Echo, Limited Color Doppler and Cardiac Doppler Indications:    Pericardial Effusion I31.3; S/P THORACIC ASCENDING ANEURYSM                 REPAIR  History:        Patient has prior history of Echocardiogram examinations, most                 recent 09/13/2020. Risk Factors:Hypertension, Dyslipidemia and                 Diabetes. - Aorta Dissection, Graft and Repair; Date:09/13/2020                 Graft Type: THORACIC ASCENDING ANEURYSM REPAIR ([facility]) USING                 HEMASHIELD PLATINUM 32MM x 30CM WOVEN DOUBLE VELOUR GRAFT.  Sonographer:    Mikki Santee RDCS (AE) Referring Phys: [place]  1. Abnormal septal motion . Left ventricular ejection fraction, by estimation, is 50 to 55%. The left ventricle has low normal function.  2. Right  ventricular systolic function is severely reduced. The right ventricular size is severely enlarged.  3. Left atrial size was mildly dilated.  4. Right atrial size was mildly dilated.  5. Moderate mitral annular calcification.  6. Tricuspid valve regurgitation is moderate.  7. The aortic valve is abnormal. Aortic valve regurgitation is mild. Mild to moderate aortic valve sclerosis/calcification is present, without any evidence of aortic stenosis.  8. Normal diameter previous graft repair. FINDINGS  Left Ventricle: Abnormal septal motion. Left ventricular ejection fraction, by estimation, is 50 to 55%. The left ventricle has low normal function. Right Ventricle: The right ventricular size is  severely enlarged. Right vetricular wall thickness was not assessed. Right ventricular systolic function is severely reduced. Left Atrium: Left atrial size was mildly dilated. Right Atrium: Right atrial size was mildly dilated. Pericardium: There is no evidence of pericardial effusion. Mitral Valve: There is mild thickening of the mitral valve leaflet(s). There is mild calcification of the mitral valve leaflet(s). Moderate mitral annular calcification. Tricuspid Valve: Tricuspid valve regurgitation is moderate. Aortic Valve: The aortic valve is abnormal. Aortic valve regurgitation is mild. Aortic regurgitation PHT measures 597 msec. Mild to moderate aortic valve sclerosis/calcification is present, without any evidence of aortic stenosis. Aorta: Normal diameter previous graft repair. Venous: The inferior vena cava was not well visualized. IAS/Shunts: The interatrial septum was not well visualized. AORTIC VALVE LVOT Vmax:   90.50 cm/s LVOT Vmean:  56.600 cm/s LVOT VTI:    0.162 m AI PHT:      597 msec TRICUSPID VALVE TR Peak grad:   43.0 mmHg TR Vmax:        328.00 cm/s  SHUNTS Systemic VTI: 0.16 m Jenkins Rouge MD Electronically signed by Jenkins Rouge MD Signature Date/Time: 09/19/2020/2:09:32 PM    Final       Medications:     Current Medications: . allopurinol  300 mg Oral Daily  . aspirin EC  325 mg Oral Daily   Or  . aspirin  324 mg Per Tube Daily  . bisacodyl  10 mg Oral Daily   Or  . bisacodyl  10 mg Rectal Daily  . chlorhexidine gluconate (MEDLINE KIT)  15 mL Mouth Rinse BID  . Chlorhexidine Gluconate Cloth  6 each Topical Daily  . docusate sodium  200 mg Oral Daily  . enoxaparin (LOVENOX) injection  30 mg Subcutaneous Q24H  . feeding supplement (ENSURE ENLIVE)  237 mL Oral BID BM  . insulin aspart  0-24 Units Subcutaneous Q4H  . latanoprost  1 drop Both Eyes QHS  . levothyroxine  25 mcg Oral q AM  . pantoprazole  40 mg Oral Daily  . simvastatin  40 mg Oral q1800  . sodium chloride  flush  10-40 mL Intracatheter Q12H  . sodium chloride flush  3 mL Intravenous Q12H     Infusions: . sodium chloride Stopped (09/14/20 0800)  . furosemide (LASIX) infusion 6 mg/hr (09/20/20 0800)  . lactated ringers Stopped (09/15/20 1905)  . milrinone 0.125 mcg/kg/min (09/20/20 0800)      Assessment/Plan   1. Acute Type I Aortic Dissection  - preoperative TEE confirmed evidence of at least moderate to severe aortic insufficiency with the dissection appearing to start just at the commissure between the left and right coronary cusp. Aortic valve tricuspid  - s/p emergent surgical repair 9/30 by Dr. Servando Snare - BP well controlled   2. RV Failure / Post- Cardiotomy Shock  - Prior Echo 08/2010 showed normal LVEF 60-65%, G1DD. RV appeared normal  on parasternal images, however on the apical 4-chamber views it appeared mildly dilated and moderately hypokinetic. - prior to recent acute illness, she had notice gradual change in functional capacity over the last 6-8 months. She is an avid walker and noticed increased dyspnea on exertion and inability to finish her exercise walks. Becoming progressively fatigue but no associated CP. - Intra-operative TEE this admit demonstrated normal LVEF 50-55%. RV moderately enlarged w/ mildly reduced systolic function, moderate TR.  - repeat limited post-op echo yesterday showed worsening RV function, cavity now severely dilated w/ severely reduced systolic function. RVSP not documented  - she has had difficulty weaning off milrinone. Remains on 0.125 w/ marginal Co-ox at 59% - she is fluid overloaded. CVP 13. Wt still ~8 lb above pre-op wt.  - increase lasix gtt to 8 ml/hr  - AKI improving, SCr down to 1.22 today. Add Digoxin 0.125  - may also benefit from sildenafil  - post discharge will need sleep study to f/o OSA +/- PFTs and V/Q scan   3. AKI on Stage III CKD  - resolved. SCr 1.9 on admit  - 1.22 today  - continue to follow w/ diuresis   4.  Pericardial Effusion  - moderate effusion noted on intraoperative TEE, drained in OR - repeat limited echo 10/6 showed no effusion   5. Anemia - expected ABLA from surgery  - hgb 8.5 today  - management per CT surgery   6. Tricuspid Regurgitation  - moderate TR, likely functional   Length of Stay: [address] PA-C  09/20/2020, 10:11 AM  Advanced Heart Failure Team Pager [phone] (M-F; 7a - 4p)  Please contact [facility] for night-coverage after hours (4p -7a ) and weekends on [email or web address]  Patient seen with PA, agree with the above note.  She had a type A aortic dissection now s/p replacement of the ascending aorta.  She has been slow to wean off milrinone post-op, still on milrinone 0.125 (co-ox dropped when stopped, therefore restarted).  Echo was done on 10/6, on my review showing EF 50-55%, D-shaped septum, mod-severely dilated RV with moderately decreased systolic function.  Peak RV-RA gradient 43 mmHg. There is concern that RV failure is preventing inotrope weaning.  The intra-op TEE report describes the RV as moderately dilated and at least mildly dysfunctional (unable to pull up the images).  I suspect that some degree of RV dysfunction pre-dates this admission.  She has no history of VTE.  She was a smoker but quit many years ago.  Not on oxygen or CPAP at home.  Her daughter does say that she was getting gradually more short of breath with exertion prior to this admission.    Today, she is on milrinone 0.125 and Lasix gtt at 6 mg/hr.  Creatinine 1.22 with co-ox 59%.  CVP is 13-14.   General: NAD Neck: JVP 14+ cm, no thyromegaly or thyroid nodule.  Lungs: Clear to auscultation bilaterally with normal respiratory effort. CV: Nondisplaced PMI.  Heart regular S1/S2, no S3/S4, no murmur.  No peripheral edema.  No carotid bruit.  Normal pedal pulses.  Abdomen: Soft, nontender, no hepatosplenomegaly, no distention.  Skin: Intact without lesions or rashes.  Neurologic:  Alert and oriented x 3.  Psych: Normal affect. Extremities: No clubbing or cyanosis.  HEENT: Normal.   Cardiogenic shock post aortic dissection repair, difficulty weaning from milrinone.  As above, post-op echo review shows dilated and dysfunctional right ventricle with D-shaped septum. LV EF 50-55%, only mild aortic  insuffciency noted.  I do think that the RV was dysfunctional pre-operatively as well based on the intra-op TEE. Remote smoker, no known history of PE/DVT.  The RCA was not approached during surgery so think RV infarct unlikely.  She is now on a low dose of milrinone at 0.125.  She is volume overloaded with elevated CVP.  - For today, would increase diuresis => increase Lasix gtt to 12 mg/hr.  Follow creatinine closely.  - Continue milrinone 0.125 for now, try to wean when volume status is better.  - Agree with addition of digoxin.  - She may need RHC if she does not wean off milrinone successfully tomorrow.  - Would arrange for V/Q scan for completeness to assess for chronic PEs (think this would be more likely than acute).    Loralie Champagne 09/20/2020 2:12 PM

## 2020-09-20 NOTE — Progress Notes (Signed)
Bipap not needed at this time. Pt resting comfortably, RT will continue to monitor as needed.

## 2020-09-21 ENCOUNTER — Inpatient Hospital Stay (HOSPITAL_COMMUNITY): Payer: Medicare HMO

## 2020-09-21 DIAGNOSIS — I50810 Right heart failure, unspecified: Secondary | ICD-10-CM | POA: Diagnosis not present

## 2020-09-21 DIAGNOSIS — I7101 Dissection of thoracic aorta: Secondary | ICD-10-CM | POA: Diagnosis not present

## 2020-09-21 LAB — BASIC METABOLIC PANEL
Anion gap: 12 (ref 5–15)
BUN: 15 mg/dL (ref 8–23)
CO2: 30 mmol/L (ref 22–32)
Calcium: 8.2 mg/dL — ABNORMAL LOW (ref 8.9–10.3)
Chloride: 92 mmol/L — ABNORMAL LOW (ref 98–111)
Creatinine, Ser: 1.17 mg/dL — ABNORMAL HIGH (ref 0.44–1.00)
GFR calc non Af Amer: 43 mL/min — ABNORMAL LOW (ref >60)
Glucose, Bld: 204 mg/dL — ABNORMAL HIGH (ref 70–99)
Potassium: 3.7 mmol/L (ref 3.5–5.1)
Sodium: 134 mmol/L — ABNORMAL LOW (ref 135–145)

## 2020-09-21 LAB — GLUCOSE, CAPILLARY
Glucose-Capillary: 113 mg/dL — ABNORMAL HIGH (ref 70–99)
Glucose-Capillary: 164 mg/dL — ABNORMAL HIGH (ref 70–99)
Glucose-Capillary: 178 mg/dL — ABNORMAL HIGH (ref 70–99)
Glucose-Capillary: 204 mg/dL — ABNORMAL HIGH (ref 70–99)
Glucose-Capillary: 82 mg/dL (ref 70–99)
Glucose-Capillary: 93 mg/dL (ref 70–99)
Glucose-Capillary: 98 mg/dL (ref 70–99)

## 2020-09-21 LAB — COOXEMETRY PANEL
Carboxyhemoglobin: 1.6 % — ABNORMAL HIGH (ref 0.5–1.5)
Methemoglobin: 0.9 % (ref 0.0–1.5)
O2 Saturation: 62.3 %
Total hemoglobin: 12.1 g/dL (ref 12.0–16.0)

## 2020-09-21 LAB — MAGNESIUM: Magnesium: 1.7 mg/dL (ref 1.7–2.4)

## 2020-09-21 MED ORDER — MAGNESIUM SULFATE 2 GM/50ML IV SOLN
2.0000 g | Freq: Once | INTRAVENOUS | Status: AC
  Administered 2020-09-21: 2 g via INTRAVENOUS
  Filled 2020-09-21: qty 50

## 2020-09-21 MED ORDER — POTASSIUM CHLORIDE CRYS ER 20 MEQ PO TBCR
40.0000 meq | EXTENDED_RELEASE_TABLET | Freq: Once | ORAL | Status: AC
  Administered 2020-09-21: 40 meq via ORAL
  Filled 2020-09-21: qty 2

## 2020-09-21 MED ORDER — TORSEMIDE 20 MG PO TABS
40.0000 mg | ORAL_TABLET | Freq: Every day | ORAL | Status: DC
  Administered 2020-09-22 – 2020-09-24 (×3): 40 mg via ORAL
  Filled 2020-09-21 (×3): qty 2

## 2020-09-21 MED ORDER — TECHNETIUM TO 99M ALBUMIN AGGREGATED
4.2000 | Freq: Once | INTRAVENOUS | Status: AC | PRN
  Administered 2020-09-21: 4.2 via INTRAVENOUS

## 2020-09-21 MED ORDER — POTASSIUM CHLORIDE CRYS ER 20 MEQ PO TBCR: 40.0000 meq | EXTENDED_RELEASE_TABLET | Freq: Once | ORAL | Status: DC

## 2020-09-21 MED ORDER — MAGNESIUM OXIDE 400 (241.3 MG) MG PO TABS
400.0000 mg | ORAL_TABLET | Freq: Two times a day (BID) | ORAL | Status: DC
  Administered 2020-09-21 – 2020-10-05 (×30): 400 mg via ORAL
  Filled 2020-09-21 (×30): qty 1

## 2020-09-21 NOTE — Progress Notes (Addendum)
[address]       [employer] [address]             [phone]      8 Days Post-Op Procedure(s) (LRB): TYPE 1 AORTIC DISSECTION WITH REPLACEMENT OF ASCENDING AORTA WITH 32MM HEMASHIELD GRAFT, RESUSPENSION OF AORTIC VALVE AND HYPOTHERMIC  CIRCULATORY ARRST WITH CEREBRAL PREFUSION   (N/A) Subjective: conts to look and feel better  Objective: Vital signs in last 24 hours: Temp:  [97.4 F (36.3 C)-98.4 F (36.9 C)] 97.4 F (36.3 C) (10/08 0635) Pulse Rate:  [98-111] 111 (10/08 0515) Cardiac Rhythm: Normal sinus rhythm;Sinus tachycardia (10/08 0415) Resp:  [15-31] 27 (10/08 0515) BP: (94-126)/(62-75) 108/72 (10/08 0515) SpO2:  [92 %-96 %] 94 % (10/08 0515) Weight:  [73.8 kg] 73.8 kg (10/08 0500)  Hemodynamic parameters for last 24 hours: CVP:  [9 mmHg-10 mmHg] 10 mmHg  Intake/Output from previous day: 10/07 0701 - 10/08 0700 In: 471.4 [P.O.:200; I.V.:271.4] Out: 3650 [Urine:3650] Intake/Output this shift: No intake/output data recorded.  General appearance: alert, cooperative and no distress Heart: regular rate and rhythm Lungs: fair air exchange throughout Abdomen: benign Extremities: no edema Wound: incis healing well without signs of infection  Lab Results: Recent Labs    09/19/20 0427 09/20/20 0407  WBC 11.6* 11.3*  HGB 8.9* 8.5*  HCT 29.2* 28.5*  PLT 257 293   BMET:  Recent Labs    09/19/20 0427 09/20/20 0407  NA 143 141  K 3.6 3.9  CL 106 103  CO2 27 28  GLUCOSE 149* 128*  BUN 25* 20  CREATININE 1.35* 1.22*  CALCIUM 8.1* 8.2*    PT/INR: No results for input(s): LABPROT, INR in the last 72 hours. ABG    Component Value Date/Time   PHART 7.478 (H) 09/15/2020 2136   HCO3 26.8 09/15/2020 2136   TCO2 28 09/15/2020 2136   ACIDBASEDEF 2.0 09/14/2020 0048   O2SAT 62.3 09/21/2020 0416   CBG (last 3)  Recent Labs    09/21/20 0425 09/21/20 0433 09/21/20 0635  GLUCAP 204* 113* 164*    Meds Scheduled Meds: . allopurinol   300 mg Oral Daily  . aspirin EC  325 mg Oral Daily   Or  . aspirin  324 mg Per Tube Daily  . bisacodyl  10 mg Oral Daily   Or  . bisacodyl  10 mg Rectal Daily  . chlorhexidine gluconate (MEDLINE KIT)  15 mL Mouth Rinse BID  . Chlorhexidine Gluconate Cloth  6 each Topical Daily  . digoxin  0.125 mg Oral Daily  . docusate sodium  200 mg Oral Daily  . enoxaparin (LOVENOX) injection  30 mg Subcutaneous Q24H  . feeding supplement (ENSURE ENLIVE)  237 mL Oral BID BM  . insulin aspart  0-24 Units Subcutaneous Q4H  . latanoprost  1 drop Both Eyes QHS  . levothyroxine  25 mcg Oral q AM  . pantoprazole  40 mg Oral Daily  . simvastatin  40 mg Oral q1800  . sodium chloride flush  10-40 mL Intracatheter Q12H  . sodium chloride flush  3 mL Intravenous Q12H   Continuous Infusions: . sodium chloride Stopped (09/14/20 0800)  . lactated ringers Stopped (09/15/20 1905)  . milrinone 0.125 mcg/kg/min (09/21/20 0400)   PRN Meds:.dextrose, levalbuterol, lip balm, metoprolol tartrate, ondansetron (ZOFRAN) IV, oxyCODONE, sodium chloride flush, traMADol  Xrays DG Chest Port 1 View  Result Date: 09/20/2020 CLINICAL DATA:  Sore chest.  Open-heart surgery. EXAM: PORTABLE CHEST 1  VIEW COMPARISON:  09/19/2020. FINDINGS: Left PICC line in stable position. Prior median sternotomy. Cardiomegaly. Diffuse bilateral pulmonary infiltrates/edema and bilateral pleural effusions again noted. No interim change. No pneumothorax. IMPRESSION: Prior median sternotomy. Cardiomegaly with diffuse bilateral pulmonary infiltrates/edema and bilateral pleural effusions again noted. No interim change. Electronically Signed   By: Marcello Moores  Register   On: 09/20/2020 07:43   ECHOCARDIOGRAM LIMITED  Result Date: 09/19/2020    ECHOCARDIOGRAM LIMITED REPORT   Patient Name:   Danielle Atkins Date of Exam: 09/19/2020 Medical Rec #:  [id number]           Height:       64.5 in Accession #:    [id number]          Weight:       154.1 lb Date of  Birth:  02-Apr-1935            BSA:          1.761 m Patient Age:    85 years            BP:           108/61 mmHg Patient Gender: F                   HR:           97 bpm. Exam Location:  Inpatient Procedure: Limited Echo, Limited Color Doppler and Cardiac Doppler Indications:    Pericardial Effusion I31.3; S/P THORACIC ASCENDING ANEURYSM                 REPAIR  History:        Patient has prior history of Echocardiogram examinations, most                 recent 09/13/2020. Risk Factors:Hypertension, Dyslipidemia and                 Diabetes. - Aorta Dissection, Graft and Repair; Date:09/13/2020                 Graft Type: THORACIC ASCENDING ANEURYSM REPAIR (AAA) USING                 HEMASHIELD PLATINUM 32MM x 30CM WOVEN DOUBLE VELOUR GRAFT.  Sonographer:    Mikki Santee RDCS (AE) Referring Phys: [place]  1. Abnormal septal motion . Left ventricular ejection fraction, by estimation, is 50 to 55%. The left ventricle has low normal function.  2. Right ventricular systolic function is severely reduced. The right ventricular size is severely enlarged.  3. Left atrial size was mildly dilated.  4. Right atrial size was mildly dilated.  5. Moderate mitral annular calcification.  6. Tricuspid valve regurgitation is moderate.  7. The aortic valve is abnormal. Aortic valve regurgitation is mild. Mild to moderate aortic valve sclerosis/calcification is present, without any evidence of aortic stenosis.  8. Normal diameter previous graft repair. FINDINGS  Left Ventricle: Abnormal septal motion. Left ventricular ejection fraction, by estimation, is 50 to 55%. The left ventricle has low normal function. Right Ventricle: The right ventricular size is severely enlarged. Right vetricular wall thickness was not assessed. Right ventricular systolic function is severely reduced. Left Atrium: Left atrial size was mildly dilated. Right Atrium: Right atrial size was mildly dilated. Pericardium: There is no evidence  of pericardial effusion. Mitral Valve: There is mild thickening of the mitral valve leaflet(s). There is mild calcification of the mitral valve leaflet(s). Moderate mitral annular calcification. Tricuspid Valve: Tricuspid  valve regurgitation is moderate. Aortic Valve: The aortic valve is abnormal. Aortic valve regurgitation is mild. Aortic regurgitation PHT measures 597 msec. Mild to moderate aortic valve sclerosis/calcification is present, without any evidence of aortic stenosis. Aorta: Normal diameter previous graft repair. Venous: The inferior vena cava was not well visualized. IAS/Shunts: The interatrial septum was not well visualized. AORTIC VALVE LVOT Vmax:   90.50 cm/s LVOT Vmean:  56.600 cm/s LVOT VTI:    0.162 m AI PHT:      597 msec TRICUSPID VALVE TR Peak grad:   43.0 mmHg TR Vmax:        328.00 cm/s  SHUNTS Systemic VTI: 0.16 m Jenkins Rouge MD Electronically signed by Jenkins Rouge MD Signature Date/Time: 09/19/2020/2:09:32 PM    Final     Assessment/Plan: S/P Procedure(s) (LRB): TYPE 1 AORTIC DISSECTION WITH REPLACEMENT OF ASCENDING AORTA WITH 32MM HEMASHIELD GRAFT, RESUSPENSION OF AORTIC VALVE AND HYPOTHERMIC  CIRCULATORY ARRST WITH CEREBRAL PREFUSION   (N/A)  1 conts with overall improvement, appreciate AHF team assisting on management. Good response from lasix gtt, now of Co-OX improving- conts milrinone and digoxin 2 no fevers 3 sats good on 8 l HFNC 4 BS variable 86-204 range/24 h 5 magnesium has been low, replaced with IV-  will add po supplement   LOS: 8 days    John Giovanni PA-C Pager [phone] 09/21/2020  Walked around unit this am dyspneic  with activity I have seen and examined Danielle Atkins and agree with the above assessment  and plan.  [name] MD Beeper [phone] Office [phone] 09/21/2020 10:38 AM

## 2020-09-21 NOTE — Progress Notes (Signed)
TCTS Evening Rounds  POD #3 s/p repair Type A dissection No complaints BP 108/72   Pulse (!) 110   Temp (!) 97.5 F (36.4 C) (Oral)   Resp (!) 27   Ht 5' 4.5" (1.638 m)   Wt 73.8 kg   SpO2 94%   BMI 27.50 kg/m  CTA Tachycardiac   Intake/Output Summary (Last 24 hours) at 09/21/2020 1656 Last data filed at 09/21/2020 0500 Gross per 24 hour  Intake 254.17 ml  Output 3450 ml  Net -3195.83 ml   Good uop  A/p: continues to improve Continue present management Danielle Atkins Z. Orvan Seen, [place]

## 2020-09-21 NOTE — Progress Notes (Signed)
Physical Therapy Treatment Patient Details Name: Danielle Atkins MRN: [id number] DOB: [birth year] Today's Date: 09/21/2020    History of Present Illness 85 yo admitted with chest pain 9/27 with aortic dissection s/p thoracic aortic aneurysm repair on 9/30 who self extubated 10/2 with progressive respiratory failure requiring bipap. PMhx:HTN, HLD, upper GIB, DM, gout    PT Comments    Pt supine in bed on arrival and reports feeling sleepy.  Pt pleasant and agreeable to PT session this am.  Pt progressed gt distance with good tolerance to increased activity.  She continues to require 8L HFNC.  Will continue to follow acutely and remain to recommend HHPT at d/c.  Will trial rollator vs. RW next session.     Follow Up Recommendations  Home health PT     Equipment Recommendations  Rolling walker with 5" wheels;3in1 (PT)    Recommendations for Other Services       Precautions / Restrictions Precautions Precautions: Sternal;Fall Precaution Booklet Issued: Yes (comment) Precaution Comments: watch sats Restrictions Weight Bearing Restrictions: No (sternal precautions)    Mobility  Bed Mobility Overal bed mobility: Needs Assistance Bed Mobility: Supine to Sit;Sit to Supine     Supine to sit: Min assist Sit to supine: Min assist   General bed mobility comments: MIn assistance to elevate trunk into sitting and for back to bed to lift B LEs against gravity.  Once in supine +2 to boost to [facility].  Transfers Overall transfer level: Needs assistance Equipment used: 4-wheeled walker;Right platform walker;Left platform walker (EVA walker) Transfers: Sit to/from Stand Sit to Stand: Min assist         General transfer comment: Cues for sequencing and hand placement on knee to rise into standing and return to sitting while moving " In the tube."  Ambulation/Gait Ambulation/Gait assistance: Min guard Gait Distance (Feet): 320 Feet Assistive device: 4-wheeled walker;Right platform  walker;Left platform walker (EVA walker) Gait Pattern/deviations: Decreased stride length;Trunk flexed;Step-through pattern;Narrow base of support     General Gait Details: Continued cues for upper trunk control and forward gaze,  Mild instability and assistance to guide EVA walker.  HR elevated to 119 bpm.  SPO2 maintained at 8L HFNC.   Stairs             Wheelchair Mobility    Modified Rankin (Stroke Patients Only)       Balance Overall balance assessment: Mild deficits observed, not formally tested                                          Cognition Arousal/Alertness: Awake/alert Behavior During Therapy: WFL for tasks assessed/performed Overall Cognitive Status: Within Functional Limits for tasks assessed                                        Exercises      General Comments        Pertinent Vitals/Pain Pain Assessment: No/denies pain    Home Living                      Prior Function            PT Goals (current goals can now be found in the care plan section) Acute Rehab PT Goals Patient Stated Goal: return to gambling at  [place], travel Potential to Achieve Goals: Good Progress towards PT goals: Progressing toward goals    Frequency    Min 3X/week      PT Plan Current plan remains appropriate    Co-evaluation              AM-PAC PT "6 Clicks" Mobility   Outcome Measure  Help needed turning from your back to your side while in a flat bed without using bedrails?: A Little Help needed moving from lying on your back to sitting on the side of a flat bed without using bedrails?: A Little Help needed moving to and from a bed to a chair (including a wheelchair)?: A Little Help needed standing up from a chair using your arms (e.g., wheelchair or bedside chair)?: A Little Help needed to walk in hospital room?: A Little Help needed climbing 3-5 steps with a railing? : A Little 6 Click Score:  18    End of Session Equipment Utilized During Treatment: Oxygen Activity Tolerance: Patient tolerated treatment well Patient left: with call bell/phone within reach;in chair Nurse Communication: Mobility status PT Visit Diagnosis: Other abnormalities of gait and mobility (R26.89);Difficulty in walking, not elsewhere classified (R26.2)     Time: 9892-1194 PT Time Calculation (min) (ACUTE ONLY): 37 min  Charges:  $Gait Training: 8-22 mins $Therapeutic Activity: 8-22 mins                     Erasmo Leventhal , PTA Acute Rehabilitation Services Pager [phone] Office [phone]     Pooja Camuso Eli Hose 09/21/2020, 11:50 AM

## 2020-09-21 NOTE — Progress Notes (Addendum)
Advanced Heart Failure Rounding Note  PCP-Cardiologist: Pixie Casino, MD   Subjective:    Remains on milrinone 0.125. Digoxin added yesterday, Co-ox slightly up but still marginal,  58>>60>>62% today.    Lasix gtt was increased yesterday to 12 ml/hr. Good diuresis. -3.7L in UOP.   Wt down 9 lb from yesterday, 171>>162 lb. Now back below pre-op wt. CVP 10-12.   BMP pending. BP soft, low 62E systolic.   Sitting up in chair eating breakfast. Feels ok. Continues on HFNC 6L/min. O2 sats mid 90s.    Objective:   Weight Range: 73.8 kg Body mass index is 27.5 kg/m.   Vital Signs:   Temp:  [97.4 F (36.3 C)-98.4 F (36.9 C)] 97.4 F (36.3 C) (10/08 0635) Pulse Rate:  [98-111] 111 (10/08 0515) Resp:  [15-31] 27 (10/08 0515) BP: (94-126)/(62-75) 108/72 (10/08 0515) SpO2:  [92 %-96 %] 94 % (10/08 0515) Weight:  [73.8 kg] 73.8 kg (10/08 0500) Last BM Date: 09/19/20  Weight change: Filed Weights   09/19/20 0500 09/20/20 0630 09/21/20 0500  Weight: 69.9 kg 77.7 kg 73.8 kg    Intake/Output:   Intake/Output Summary (Last 24 hours) at 09/21/2020 0713 Last data filed at 09/21/2020 0500 Gross per 24 hour  Intake 471.41 ml  Output 3650 ml  Net -3178.59 ml      Physical Exam    CVP 10-12 General:  Fatigue appearing. Sitting up in chair. No resp difficulty HEENT: Normal Neck: Supple. JVP ~10 cm . Carotids 2+ bilat; no bruits. No lymphadenopathy or thyromegaly appreciated. Cor: PMI nondisplaced. Regular rhythm, mildy tachy No rubs, gallops or murmurs. Sternotomy site ok  Lungs: deceased bs at the bases  Abdomen: Soft, nontender, nondistended. No hepatosplenomegaly. No bruits or masses. Good bowel sounds. Extremities: No cyanosis, clubbing, rash, edema Neuro: Alert & orientedx3, cranial nerves grossly intact. moves all 4 extremities w/o difficulty. Affect pleasant   Telemetry   Sinus tach low 100s   EKG    No new EKG to view   Labs    CBC Recent Labs     09/19/20 0427 09/20/20 0407  WBC 11.6* 11.3*  HGB 8.9* 8.5*  HCT 29.2* 28.5*  MCV 75.1* 75.8*  PLT 257 366   Basic Metabolic Panel Recent Labs    09/19/20 0427 09/19/20 0427 09/20/20 0407 09/21/20 0416  NA 143  --  141  --   K 3.6  --  3.9  --   CL 106  --  103  --   CO2 27  --  28  --   GLUCOSE 149*  --  128*  --   BUN 25*  --  20  --   CREATININE 1.35*  --  1.22*  --   CALCIUM 8.1*  --  8.2*  --   MG 1.7   < > 1.7 1.7   < > = values in this interval not displayed.   Liver Function Tests Recent Labs    09/19/20 0427  AST 82*  ALT 43  ALKPHOS 68  BILITOT 1.3*  PROT 6.4*  ALBUMIN 2.4*   No results for input(s): LIPASE, AMYLASE in the last 72 hours. Cardiac Enzymes No results for input(s): CKTOTAL, CKMB, CKMBINDEX, TROPONINI in the last 72 hours.  BNP: BNP (last 3 results) Recent Labs    09/13/20 1129  BNP 388.1*    ProBNP (last 3 results) No results for input(s): PROBNP in the last 8760 hours.   D-Dimer No results for input(s):  DDIMER in the last 72 hours. Hemoglobin A1C No results for input(s): HGBA1C in the last 72 hours. Fasting Lipid Panel Recent Labs    09/19/20 0427  CHOL 86  HDL 23*  LDLCALC 44  TRIG 96  CHOLHDL 3.7   Thyroid Function Tests Recent Labs    09/19/20 0427  TSH 4.607*    Other results:   Imaging     No results found.   Medications:     Scheduled Medications: . allopurinol  300 mg Oral Daily  . aspirin EC  325 mg Oral Daily   Or  . aspirin  324 mg Per Tube Daily  . bisacodyl  10 mg Oral Daily   Or  . bisacodyl  10 mg Rectal Daily  . chlorhexidine gluconate (MEDLINE KIT)  15 mL Mouth Rinse BID  . Chlorhexidine Gluconate Cloth  6 each Topical Daily  . digoxin  0.125 mg Oral Daily  . docusate sodium  200 mg Oral Daily  . enoxaparin (LOVENOX) injection  30 mg Subcutaneous Q24H  . feeding supplement (ENSURE ENLIVE)  237 mL Oral BID BM  . insulin aspart  0-24 Units Subcutaneous Q4H  . latanoprost  1  drop Both Eyes QHS  . levothyroxine  25 mcg Oral q AM  . pantoprazole  40 mg Oral Daily  . potassium chloride  40 mEq Oral Once  . simvastatin  40 mg Oral q1800  . sodium chloride flush  10-40 mL Intracatheter Q12H  . sodium chloride flush  3 mL Intravenous Q12H     Infusions: . sodium chloride Stopped (09/14/20 0800)  . furosemide (LASIX) infusion 12 mg/hr (09/21/20 0400)  . lactated ringers Stopped (09/15/20 1905)  . magnesium sulfate bolus IVPB 2 g (09/21/20 0621)  . milrinone 0.125 mcg/kg/min (09/21/20 0400)     PRN Medications:  dextrose, levalbuterol, lip balm, metoprolol tartrate, ondansetron (ZOFRAN) IV, oxyCODONE, sodium chloride flush, traMADol   Assessment/Plan   1. Acute Type I Aortic Dissection  - preoperative TEE confirmed evidence of at least moderate to severe aortic insufficiency with the dissection appearing to start just at the commissure between the left and right coronary cusp. Aortic valve tricuspid  - s/p emergent surgical repair 9/30 by Dr. Servando Snare - BP well controlled    2. RV Failure / Post- Cardiotomy Shock  - Prior Echo 08/2010 showed normal LVEF 60-65%, G1DD. RV appeared normal on parasternal images, however on the apical 4-chamber views it appeared mildly dilated and moderately hypokinetic. - prior to recent acute illness, she had notice gradual change in functional capacity over the last 6-8 months. She is an avid walker and noticed increased dyspnea on exertion and inability to finish her exercise walks. Becoming progressively fatigue but no associated CP. - Intra-operative TEE this admit demonstrated normal LVEF 50-55%. RV moderately enlarged w/ mildly reduced systolic function, moderate TR.  - repeat limited post-op echo showed worsening RV function, D-shaped septum, mod-severely dilated RV with moderately decreased systolic function.  Peak RV-RA gradient 43 mmHg. - she has had difficulty weaning off milrinone. Co-ox dropped when stopped,  therefore restarted. Remains on 0.125 w/ marginal Co-ox at 62% - continue digoxin 0.125 - good diuresis on lasix gtt, down 9 lb from yesterday and back below pre op wt. CVP 10-12, probably as good as we will get w/ RV failure - stop lasix gtt for now, transition to PO torsemide  - Check BMP  - V/Q scan today negative  -She may need RHC if she does not  wean off milrinone successfully     3. AKI on Stage III CKD  - resolved. SCr 1.9 on admit  - 1.22 yesterday. Repeat BMP pending   4. Pericardial Effusion  - moderate effusion noted on intraoperative TEE, drained in OR - repeat limited echo 10/6 showed no effusion   5. Anemia - expected ABLA from surgery  - hgb 8.5  - management per CT surgery   6. Tricuspid Regurgitation  - moderate TR, likely functional   Length of Stay: [address] PA-C  09/21/2020, 7:13 AM  Advanced Heart Failure Team Pager [phone] (M-F; 7a - 4p)  Please contact [facility] for night-coverage after hours (4p -7a ) and weekends on [email or web address]  Patient seen and examined with the above-signed Advanced Practice Provider and/or Housestaff. I personally reviewed laboratory data, imaging studies and relevant notes. I independently examined the patient and formulated the important aspects of the plan. I have edited the note to reflect any of my changes or salient points. I have personally discussed the plan with the patient and/or family.  Remains on milrinone 0.125. Co-ox 62%. CVP down to 8. IV lasix stopped. VQ negative. Was able to walk unit today.   General:  Weak appearing. No resp difficulty HEENT: normal Neck: supple. JVP 8-9  Carotids 2+ bilat; no bruits. No lymphadenopathy or thryomegaly appreciated. Cor: PMI nondisplaced. Sternal wound ok  Regular rate & rhythm. 2/6 TR Lungs: clear Abdomen: soft, nontender, nondistended. No hepatosplenomegaly. No bruits or masses. Good bowel sounds. Extremities: no cyanosis, clubbing, rash, edema Neuro:  alert & orientedx3, cranial nerves grossly intact. moves all 4 extremities w/o difficulty. Affect pleasant  She remains weak but improving. Co-ox ok on low dose milrinone. Volume status looks good. Continue milrinone on more day. Continue digoxin. Switch to po diuretics. Continue to mobilize. Possible to 2C in am. D/w Dr. Servando Snare.   Agree with Dr. Aundra Dubin that some of her RV dysfunction may have predated surgery. VQ negative. On initial chest CT, mid to distal RCA not well seen. ? If she has had previous RV infarct. Can consider cMRI prior to d/c.    Glori Bickers, MD  4:41 PM

## 2020-09-22 ENCOUNTER — Inpatient Hospital Stay (HOSPITAL_COMMUNITY): Payer: Medicare HMO

## 2020-09-22 DIAGNOSIS — I7100 Dissection of unspecified site of aorta: Secondary | ICD-10-CM | POA: Diagnosis not present

## 2020-09-22 DIAGNOSIS — I50810 Right heart failure, unspecified: Secondary | ICD-10-CM | POA: Diagnosis not present

## 2020-09-22 LAB — BASIC METABOLIC PANEL
Anion gap: 9 (ref 5–15)
BUN: 20 mg/dL (ref 8–23)
CO2: 31 mmol/L (ref 22–32)
Calcium: 8.4 mg/dL — ABNORMAL LOW (ref 8.9–10.3)
Chloride: 97 mmol/L — ABNORMAL LOW (ref 98–111)
Creatinine, Ser: 1.14 mg/dL — ABNORMAL HIGH (ref 0.44–1.00)
GFR, Estimated: 44 mL/min — ABNORMAL LOW (ref >60)
Glucose, Bld: 113 mg/dL — ABNORMAL HIGH (ref 70–99)
Potassium: 3.9 mmol/L (ref 3.5–5.1)
Sodium: 137 mmol/L (ref 135–145)

## 2020-09-22 LAB — CBC
HCT: 27.3 % — ABNORMAL LOW (ref 36.0–46.0)
Hemoglobin: 8.4 g/dL — ABNORMAL LOW (ref 12.0–15.0)
MCH: 22.9 pg — ABNORMAL LOW (ref 26.0–34.0)
MCHC: 30.8 g/dL (ref 30.0–36.0)
MCV: 74.4 fL — ABNORMAL LOW (ref 80.0–100.0)
Platelets: 358 10*3/uL (ref 150–400)
RBC: 3.67 MIL/uL — ABNORMAL LOW (ref 3.87–5.11)
RDW: 21.8 % — ABNORMAL HIGH (ref 11.5–15.5)
WBC: 10.9 10*3/uL — ABNORMAL HIGH (ref 4.0–10.5)
nRBC: 0.4 % — ABNORMAL HIGH (ref 0.0–0.2)

## 2020-09-22 LAB — GLUCOSE, CAPILLARY
Glucose-Capillary: 100 mg/dL — ABNORMAL HIGH (ref 70–99)
Glucose-Capillary: 105 mg/dL — ABNORMAL HIGH (ref 70–99)
Glucose-Capillary: 112 mg/dL — ABNORMAL HIGH (ref 70–99)
Glucose-Capillary: 122 mg/dL — ABNORMAL HIGH (ref 70–99)
Glucose-Capillary: 130 mg/dL — ABNORMAL HIGH (ref 70–99)
Glucose-Capillary: 174 mg/dL — ABNORMAL HIGH (ref 70–99)

## 2020-09-22 LAB — COOXEMETRY PANEL
Carboxyhemoglobin: 1.6 % — ABNORMAL HIGH (ref 0.5–1.5)
Methemoglobin: 1.1 % (ref 0.0–1.5)
O2 Saturation: 53.1 %
Total hemoglobin: 9.7 g/dL — ABNORMAL LOW (ref 12.0–16.0)

## 2020-09-22 MED ORDER — POTASSIUM CHLORIDE CRYS ER 20 MEQ PO TBCR
20.0000 meq | EXTENDED_RELEASE_TABLET | Freq: Once | ORAL | Status: AC
  Administered 2020-09-22: 20 meq via ORAL
  Filled 2020-09-22: qty 1

## 2020-09-22 NOTE — Progress Notes (Signed)
Advanced Heart Failure Rounding Note  PCP-Cardiologist: Pixie Casino, MD   Subjective:    Remains on milrinone 0.125. Co-ox down to 53% today.  Feels ok. Walked entire unit already. CVP 6   Denies CP or SOB   Objective:   Weight Range: 74.1 kg Body mass index is 27.61 kg/m.   Vital Signs:   Temp:  [97.3 F (36.3 C)-98 F (36.7 C)] 97.3 F (36.3 C) (10/09 0822) Pulse Rate:  [102-113] 109 (10/09 0804) Resp:  [14-30] 30 (10/09 0804) BP: (96-120)/(60-71) 104/62 (10/09 0804) SpO2:  [91 %-99 %] 92 % (10/09 0804) Weight:  [74.1 kg] 74.1 kg (10/09 0553) Last BM Date: 09/21/20  Weight change: Filed Weights   09/20/20 0630 09/21/20 0500 09/22/20 0553  Weight: 77.7 kg 73.8 kg 74.1 kg    Intake/Output:   Intake/Output Summary (Last 24 hours) at 09/22/2020 0837 Last data filed at 09/22/2020 0800 Gross per 24 hour  Intake 311.11 ml  Output 850 ml  Net -538.89 ml      Physical Exam    General: Sitting up in chair. No resp difficulty HEENT: normal Neck: supple.JVP 6 Carotids 2+ bilat; no bruits. No lymphadenopathy or thryomegaly appreciated. Cor: PMI nondisplaced. Chest wound ok  Tachy regular. + RV lift 2/6 TR Lungs: clear Abdomen: soft, nontender, nondistended. No hepatosplenomegaly. No bruits or masses. Good bowel sounds. Extremities: no cyanosis, clubbing, rash, edema Neuro: alert & orientedx3, cranial nerves grossly intact. moves all 4 extremities w/o difficulty. Affect pleasant   Telemetry   Sinus tach 100-110 Personally reviewed  Labs    CBC Recent Labs    09/20/20 0407 09/22/20 0329  WBC 11.3* 10.9*  HGB 8.5* 8.4*  HCT 28.5* 27.3*  MCV 75.8* 74.4*  PLT 293 662   Basic Metabolic Panel Recent Labs    09/20/20 0407 09/20/20 0407 09/21/20 0416 09/22/20 0329  NA 141   < > 134* 137  K 3.9   < > 3.7 3.9  CL 103   < > 92* 97*  CO2 28   < > 30 31  GLUCOSE 128*   < > 204* 113*  BUN 20   < > 15 20  CREATININE 1.22*   < > 1.17* 1.14*   CALCIUM 8.2*   < > 8.2* 8.4*  MG 1.7  --  1.7  --    < > = values in this interval not displayed.   Liver Function Tests No results for input(s): AST, ALT, ALKPHOS, BILITOT, PROT, ALBUMIN in the last 72 hours. No results for input(s): LIPASE, AMYLASE in the last 72 hours. Cardiac Enzymes No results for input(s): CKTOTAL, CKMB, CKMBINDEX, TROPONINI in the last 72 hours.  BNP: BNP (last 3 results) Recent Labs    09/13/20 1129  BNP 388.1*    ProBNP (last 3 results) No results for input(s): PROBNP in the last 8760 hours.   D-Dimer No results for input(s): DDIMER in the last 72 hours. Hemoglobin A1C No results for input(s): HGBA1C in the last 72 hours. Fasting Lipid Panel No results for input(s): CHOL, HDL, LDLCALC, TRIG, CHOLHDL, LDLDIRECT in the last 72 hours. Thyroid Function Tests No results for input(s): TSH, T4TOTAL, T3FREE, THYROIDAB in the last 72 hours.  Invalid input(s): FREET3  Other results:   Imaging    NM Pulmonary Perfusion  Result Date: 09/21/2020 CLINICAL DATA:  Shortness of breath EXAM: NUCLEAR MEDICINE PERFUSION LUNG SCAN TECHNIQUE: Perfusion images were obtained in multiple projections after intravenous injection of radiopharmaceutical. Views:  Anterior, posterior, left lateral, right lateral, RPO, LPO, RAO, LAO RADIOPHARMACEUTICALS:  4.2 mCi Tc-79m MAA IV COMPARISON:  Chest radiograph September 21, 2020 FINDINGS: Radiotracer uptake is homogeneous and symmetric bilaterally. No perfusion defects appreciable. There is cardiac prominence. IMPRESSION: No appreciable perfusion defects. No findings suggestive of pulmonary embolus on perfusion study. Cardiac prominence noted. Electronically Signed   By: Lowella Grip III M.D.   On: 09/21/2020 14:40   DG CHEST PORT 1 VIEW  Result Date: 09/21/2020 CLINICAL DATA:  Recent chest pain and shortness of breath. Recent thoracic aortic aneurysm repair EXAM: PORTABLE CHEST 1 VIEW COMPARISON:  September 20, 2020 FINDINGS:  Central catheter tip is in the superior vena cava. No pneumothorax. There are persistent small pleural effusions with interstitial pulmonary edema. There is atelectatic change in the lung bases. There is stable cardiomegaly and prominence of the thoracic aorta, stable. No bone lesions. IMPRESSION: Stable cardiomediastinal silhouette. Small pleural effusions with interstitial edema. Suspect a degree of underlying congestive heart failure. Bibasilar atelectasis. Central catheter tip in superior vena cava near cavoatrial junction. No pneumothorax. Electronically Signed   By: Lowella Grip III M.D.   On: 09/21/2020 10:15     Medications:     Scheduled Medications: . allopurinol  300 mg Oral Daily  . aspirin EC  325 mg Oral Daily   Or  . aspirin  324 mg Per Tube Daily  . bisacodyl  10 mg Oral Daily   Or  . bisacodyl  10 mg Rectal Daily  . chlorhexidine gluconate (MEDLINE KIT)  15 mL Mouth Rinse BID  . Chlorhexidine Gluconate Cloth  6 each Topical Daily  . digoxin  0.125 mg Oral Daily  . docusate sodium  200 mg Oral Daily  . enoxaparin (LOVENOX) injection  30 mg Subcutaneous Q24H  . feeding supplement (ENSURE ENLIVE)  237 mL Oral BID BM  . insulin aspart  0-24 Units Subcutaneous Q4H  . latanoprost  1 drop Both Eyes QHS  . levothyroxine  25 mcg Oral q AM  . magnesium oxide  400 mg Oral BID  . pantoprazole  40 mg Oral Daily  . simvastatin  40 mg Oral q1800  . sodium chloride flush  10-40 mL Intracatheter Q12H  . sodium chloride flush  3 mL Intravenous Q12H  . torsemide  40 mg Oral Daily    Infusions: . sodium chloride Stopped (09/14/20 0800)  . lactated ringers Stopped (09/15/20 1905)  . milrinone 0.125 mcg/kg/min (09/21/20 1400)    PRN Medications: dextrose, levalbuterol, lip balm, metoprolol tartrate, ondansetron (ZOFRAN) IV, oxyCODONE, sodium chloride flush, traMADol   Assessment/Plan   1. Acute Type I Aortic Dissection  - preoperative TEE confirmed evidence of at least  moderate to severe aortic insufficiency with the dissection appearing to start just at the commissure between the left and right coronary cusp. Aortic valve tricuspid  - s/p emergent surgical repair 9/30 by Dr. Servando Snare - BP well controlled and stable  2. RV Failure / Post- Cardiotomy Shock  - Prior Echo 08/2010 showed normal LVEF 60-65%, G1DD. RV appeared normal on parasternal images, however on the apical 4-chamber views it appeared mildly dilated and moderately hypokinetic. - prior to recent acute illness, she had notice gradual change in functional capacity over the last 6-8 months. She is an avid walker and noticed increased dyspnea on exertion and inability to finish her exercise walks. Becoming progressively fatigue but no associated CP. - Intra-operative TEE this admit demonstrated normal LVEF 50-55%. RV moderately enlarged w/ mildly reduced  systolic function, moderate TR.  - repeat limited post-op echo showed worsening RV function, D-shaped septum, mod-severely dilated RV with moderately decreased systolic function.  Peak RV-RA gradient 43 mmHg. - she has had difficulty weaning off milrinone. Co-ox dropped when stopped, therefore restarted. Remains on 0.125 w/ marginal Co-ox at 53%. Will continue for now  - continue digoxin 0.125 - CVP stable at 6. Weight at baseline. Transitioned to oral diuretics yesterday. Adjust as needed - Renal function stable  - She may need RHC if she does not wean off milrinone successfully  - Agree with Dr. Aundra Dubin that some of her RV dysfunction may have predated surgery. VQ negative. On initial chest CT, mid to distal RCA not well seen. ? If she has had previous RV infarct. Will do cMRI on Monday   3. AKI on Stage III CKD  - resolved. SCr 1.9 on admit  - 1.14 today  4. Pericardial Effusion  - moderate effusion noted on intraoperative TEE, drained in OR - repeat limited echo 10/6 showed no effusion   5. Anemia - expected ABLA from surgery  - hgb 8.4 -  management per CT surgery   6. Tricuspid Regurgitation  - moderate TR, likely functional   Can go to 2C. D/w Dr. Orvan Seen.   Length of Stay: [place], MD  09/22/2020, 8:37 AM  Advanced Heart Failure Team Pager [phone] (M-F; 7a - 4p)  Please contact [facility] for night-coverage after hours (4p -7a ) and weekends on [email or web address]

## 2020-09-22 NOTE — Plan of Care (Signed)
  Problem: Education: Goal: Knowledge of General Education information will improve Description: Including pain rating scale, medication(s)/side effects and non-pharmacologic comfort measures Outcome: Progressing   Problem: Health Behavior/Discharge Planning: Goal: Ability to manage health-related needs will improve Outcome: Progressing   Problem: Clinical Measurements: Goal: Cardiovascular complication will be avoided Outcome: Progressing   Problem: Nutrition: Goal: Adequate nutrition will be maintained Outcome: Progressing   Problem: Pain Managment: Goal: General experience of comfort will improve Outcome: Progressing   Problem: Safety: Goal: Ability to remain free from injury will improve Outcome: Progressing   Problem: Skin Integrity: Goal: Risk for impaired skin integrity will decrease Outcome: Progressing   

## 2020-09-22 NOTE — Progress Notes (Signed)
9 Days Post-Op Procedure(s) (LRB): TYPE 1 AORTIC DISSECTION WITH REPLACEMENT OF ASCENDING AORTA WITH 32MM HEMASHIELD GRAFT, RESUSPENSION OF AORTIC VALVE AND HYPOTHERMIC  CIRCULATORY ARRST WITH CEREBRAL PREFUSION   (N/A) Subjective: No complaints  Objective: Vital signs in last 24 hours: Temp:  [97.3 F (36.3 C)-98 F (36.7 C)] 97.3 F (36.3 C) (10/09 0822) Pulse Rate:  [102-113] 109 (10/09 0804) Cardiac Rhythm: Sinus tachycardia (10/09 0804) Resp:  [14-30] 30 (10/09 0804) BP: (96-120)/(60-71) 104/62 (10/09 0804) SpO2:  [91 %-99 %] 92 % (10/09 0804) Weight:  [74.1 kg] 74.1 kg (10/09 0553)  Hemodynamic parameters for last 24 hours: CVP:  [0 mmHg-2 mmHg] 2 mmHg  Intake/Output from previous day: 10/08 0701 - 10/09 0700 In: 122.2 [I.V.:122.2] Out: 850 [Urine:850] Intake/Output this shift: Total I/O In: 245.2 [P.O.:240; I.V.:5.2] Out: -   General appearance: alert and cooperative Neurologic: intact Heart: regular rate and rhythm, S1, S2 normal, no murmur, click, rub or gallop Lungs: diminished breath sounds RLL Abdomen: soft, non-tender; bowel sounds normal; no masses,  no organomegaly Extremities: extremities normal, atraumatic, no cyanosis or edema Wound: dressed, dry  Lab Results: Recent Labs    09/20/20 0407 09/22/20 0329  WBC 11.3* 10.9*  HGB 8.5* 8.4*  HCT 28.5* 27.3*  PLT 293 358   BMET:  Recent Labs    09/21/20 0416 09/22/20 0329  NA 134* 137  K 3.7 3.9  CL 92* 97*  CO2 30 31  GLUCOSE 204* 113*  BUN 15 20  CREATININE 1.17* 1.14*  CALCIUM 8.2* 8.4*    PT/INR: No results for input(s): LABPROT, INR in the last 72 hours. ABG    Component Value Date/Time   PHART 7.478 (H) 09/15/2020 2136   HCO3 26.8 09/15/2020 2136   TCO2 28 09/15/2020 2136   ACIDBASEDEF 2.0 09/14/2020 0048   O2SAT 53.1 09/22/2020 0329   CBG (last 3)  Recent Labs    09/21/20 2345 09/22/20 0404 09/22/20 0906  GLUCAP 98 105* 174*    Assessment/Plan: S/P Procedure(s)  (LRB): TYPE 1 AORTIC DISSECTION WITH REPLACEMENT OF ASCENDING AORTA WITH 32MM HEMASHIELD GRAFT, RESUSPENSION OF AORTIC VALVE AND HYPOTHERMIC  CIRCULATORY ARRST WITH CEREBRAL PREFUSION   (N/A) Plan for transfer to step-down: see transfer orders  Repeat CXR in am   LOS: 9 days    Wonda Olds 09/22/2020

## 2020-09-22 NOTE — Progress Notes (Signed)
CARDIAC REHAB PHASE I   PRE:  Rate/Rhythm: 111 ST    BP: sitting 100/65    SaO2: 98 6L  MODE:  Ambulation: 370 ft   POST:  Rate/Rhythm: 119 ST    BP: sitting 111/67     SaO2: 99 6L  Pt to BSC then ambulated with [place] and 6L, assist x2 with gait belt. Fairly steady, no major c/o. VSS. To bed after being up in recliner all morning. Pt motivated. Will f/u Monday. [place], ACSM 09/22/2020 2:21 PM

## 2020-09-23 ENCOUNTER — Inpatient Hospital Stay (HOSPITAL_COMMUNITY): Payer: Medicare HMO

## 2020-09-23 DIAGNOSIS — I7100 Dissection of unspecified site of aorta: Secondary | ICD-10-CM | POA: Diagnosis not present

## 2020-09-23 DIAGNOSIS — I50810 Right heart failure, unspecified: Secondary | ICD-10-CM | POA: Diagnosis not present

## 2020-09-23 LAB — COOXEMETRY PANEL
Carboxyhemoglobin: 1.6 % — ABNORMAL HIGH (ref 0.5–1.5)
Methemoglobin: 0.6 % (ref 0.0–1.5)
O2 Saturation: 55.8 %
Total hemoglobin: 8.9 g/dL — ABNORMAL LOW (ref 12.0–16.0)

## 2020-09-23 LAB — GLUCOSE, CAPILLARY
Glucose-Capillary: 105 mg/dL — ABNORMAL HIGH (ref 70–99)
Glucose-Capillary: 103 mg/dL — ABNORMAL HIGH (ref 70–99)
Glucose-Capillary: 116 mg/dL — ABNORMAL HIGH (ref 70–99)
Glucose-Capillary: 123 mg/dL — ABNORMAL HIGH (ref 70–99)
Glucose-Capillary: 120 mg/dL — ABNORMAL HIGH (ref 70–99)

## 2020-09-23 LAB — BASIC METABOLIC PANEL
Anion gap: 10 (ref 5–15)
BUN: 19 mg/dL (ref 8–23)
CO2: 30 mmol/L (ref 22–32)
Calcium: 8.7 mg/dL — ABNORMAL LOW (ref 8.9–10.3)
Chloride: 97 mmol/L — ABNORMAL LOW (ref 98–111)
Creatinine, Ser: 1.18 mg/dL — ABNORMAL HIGH (ref 0.44–1.00)
GFR, Estimated: 43 mL/min — ABNORMAL LOW (ref >60)
Glucose, Bld: 102 mg/dL — ABNORMAL HIGH (ref 70–99)
Potassium: 4 mmol/L (ref 3.5–5.1)
Sodium: 137 mmol/L (ref 135–145)

## 2020-09-23 LAB — CBC
HCT: 27.2 % — ABNORMAL LOW (ref 36.0–46.0)
Hemoglobin: 8.5 g/dL — ABNORMAL LOW (ref 12.0–15.0)
MCH: 23.4 pg — ABNORMAL LOW (ref 26.0–34.0)
MCHC: 31.3 g/dL (ref 30.0–36.0)
MCV: 74.9 fL — ABNORMAL LOW (ref 80.0–100.0)
Platelets: 388 10*3/uL (ref 150–400)
RBC: 3.63 MIL/uL — ABNORMAL LOW (ref 3.87–5.11)
RDW: 21.8 % — ABNORMAL HIGH (ref 11.5–15.5)
WBC: 9.7 10*3/uL (ref 4.0–10.5)
nRBC: 0.2 % (ref 0.0–0.2)

## 2020-09-23 NOTE — Plan of Care (Signed)
  Problem: Education: Goal: Knowledge of General Education information will improve Description: Including pain rating scale, medication(s)/side effects and non-pharmacologic comfort measures Outcome: Progressing   Problem: Health Behavior/Discharge Planning: Goal: Ability to manage health-related needs will improve Outcome: Progressing   Problem: Clinical Measurements: Goal: Ability to maintain clinical measurements within normal limits will improve Outcome: Progressing   Problem: Clinical Measurements: Goal: Cardiovascular complication will be avoided Outcome: Progressing   Problem: Clinical Measurements: Goal: Diagnostic test results will improve Outcome: Progressing   Problem: Activity: Goal: Risk for activity intolerance will decrease Outcome: Progressing   Problem: Nutrition: Goal: Adequate nutrition will be maintained Outcome: Progressing   Problem: Pain Managment: Goal: General experience of comfort will improve Outcome: Progressing   Problem: Skin Integrity: Goal: Risk for impaired skin integrity will decrease Outcome: Progressing

## 2020-09-23 NOTE — Progress Notes (Signed)
10 Days Post-Op Procedure(s) (LRB): TYPE 1 AORTIC DISSECTION WITH REPLACEMENT OF ASCENDING AORTA WITH 32MM HEMASHIELD GRAFT, RESUSPENSION OF AORTIC VALVE AND HYPOTHERMIC  CIRCULATORY ARRST WITH CEREBRAL PREFUSION   (N/A) Subjective: No complaints;  Objective: Vital signs in last 24 hours: Temp:  [97.6 F (36.4 C)-99.6 F (37.6 C)] 97.8 F (36.6 C) (10/10 0837) Pulse Rate:  [103-116] 112 (10/10 0837) Cardiac Rhythm: Sinus tachycardia;Bundle branch block (10/10 0700) Resp:  [18-26] 20 (10/10 0837) BP: (86-116)/(60-76) 114/67 (10/10 0837) SpO2:  [92 %-97 %] 92 % (10/10 0837) Weight:  [76 kg] 76 kg (10/10 0344)  Hemodynamic parameters for last 24 hours:    Intake/Output from previous day: 10/09 0701 - 10/10 0700 In: 1016.6 [P.O.:960; I.V.:56.6] Out: 702 [Urine:701; Stool:1] Intake/Output this shift: Total I/O In: 240 [P.O.:240] Out: -   General appearance: resting comfortably Neurologic: intact Heart: tachy Lungs: clear to auscultation bilaterally Extremities: edema mild Wound: c/d/i  Lab Results: Recent Labs    09/22/20 0329 09/23/20 0255  WBC 10.9* 9.7  HGB 8.4* 8.5*  HCT 27.3* 27.2*  PLT 358 388   BMET:  Recent Labs    09/22/20 0329 09/23/20 0255  NA 137 137  K 3.9 4.0  CL 97* 97*  CO2 31 30  GLUCOSE 113* 102*  BUN 20 19  CREATININE 1.14* 1.18*  CALCIUM 8.4* 8.7*    PT/INR: No results for input(s): LABPROT, INR in the last 72 hours. ABG    Component Value Date/Time   PHART 7.478 (H) 09/15/2020 2136   HCO3 26.8 09/15/2020 2136   TCO2 28 09/15/2020 2136   ACIDBASEDEF 2.0 09/14/2020 0048   O2SAT 55.8 09/23/2020 0255   CBG (last 3)  Recent Labs    09/22/20 2348 09/23/20 0328 09/23/20 0745  GLUCAP 100* 105* 103*    Assessment/Plan: S/P Procedure(s) (LRB): TYPE 1 AORTIC DISSECTION WITH REPLACEMENT OF ASCENDING AORTA WITH 32MM HEMASHIELD GRAFT, RESUSPENSION OF AORTIC VALVE AND HYPOTHERMIC  CIRCULATORY ARRST WITH CEREBRAL PREFUSION    (N/A) continue PT  Consider placement options 1st of week   LOS: 10 days    Wonda Olds 09/23/2020

## 2020-09-23 NOTE — Progress Notes (Signed)
Advanced Heart Failure Rounding Note  PCP-Cardiologist: Pixie Casino, MD   Subjective:    Remains on milrinone 0.125. Co-ox down to 53%-> 56% today.  Feels ok. Denies CP or SOB. Able to walk hall. On po diuretics. Only 700cc recorded out. Weight up 4 pounds, CVP 4  Objective:   Weight Range: 76 kg Body mass index is 28.32 kg/m.   Vital Signs:   Temp:  [97.6 F (36.4 C)-99.6 F (37.6 C)] 97.8 F (36.6 C) (10/10 0837) Pulse Rate:  [103-116] 112 (10/10 0837) Resp:  [18-26] 20 (10/10 0837) BP: (86-116)/(60-76) 114/67 (10/10 0837) SpO2:  [92 %-97 %] 92 % (10/10 0837) Weight:  [76 kg] 76 kg (10/10 0344) Last BM Date: 09/22/20  Weight change: Filed Weights   09/21/20 0500 09/22/20 0553 09/23/20 0344  Weight: 73.8 kg 74.1 kg 76 kg    Intake/Output:   Intake/Output Summary (Last 24 hours) at 09/23/2020 1018 Last data filed at 09/23/2020 0800 Gross per 24 hour  Intake 766.07 ml  Output 552 ml  Net 214.07 ml      Physical Exam    General: Sitting up in bed No resp difficulty HEENT: normal Neck: supple. no JVD. Carotids 2+ bilat; no bruits. No lymphadenopathy or thryomegaly appreciated. Cor: PMI nondisplaced. Tachy regular. + RV lift. 2/6 TR Lungs: clear Abdomen: soft, nontender, nondistended. No hepatosplenomegaly. No bruits or masses. Good bowel sounds. Extremities: no cyanosis, clubbing, rash, trace edema Neuro: alert & orientedx3, cranial nerves grossly intact. moves all 4 extremities w/o difficulty. Affect pleasant  Telemetry   Sinus tach 100-115 Personally reviewed  Labs    CBC Recent Labs    09/22/20 0329 09/23/20 0255  WBC 10.9* 9.7  HGB 8.4* 8.5*  HCT 27.3* 27.2*  MCV 74.4* 74.9*  PLT 358 606   Basic Metabolic Panel Recent Labs    09/21/20 0416 09/21/20 0416 09/22/20 0329 09/23/20 0255  NA 134*   < > 137 137  K 3.7   < > 3.9 4.0  CL 92*   < > 97* 97*  CO2 30   < > 31 30  GLUCOSE 204*   < > 113* 102*  BUN 15   < > 20 19   CREATININE 1.17*   < > 1.14* 1.18*  CALCIUM 8.2*   < > 8.4* 8.7*  MG 1.7  --   --   --    < > = values in this interval not displayed.   Liver Function Tests No results for input(s): AST, ALT, ALKPHOS, BILITOT, PROT, ALBUMIN in the last 72 hours. No results for input(s): LIPASE, AMYLASE in the last 72 hours. Cardiac Enzymes No results for input(s): CKTOTAL, CKMB, CKMBINDEX, TROPONINI in the last 72 hours.  BNP: BNP (last 3 results) Recent Labs    09/13/20 1129  BNP 388.1*    ProBNP (last 3 results) No results for input(s): PROBNP in the last 8760 hours.   D-Dimer No results for input(s): DDIMER in the last 72 hours. Hemoglobin A1C No results for input(s): HGBA1C in the last 72 hours. Fasting Lipid Panel No results for input(s): CHOL, HDL, LDLCALC, TRIG, CHOLHDL, LDLDIRECT in the last 72 hours. Thyroid Function Tests No results for input(s): TSH, T4TOTAL, T3FREE, THYROIDAB in the last 72 hours.  Invalid input(s): FREET3  Other results:   Imaging    No results found.   Medications:     Scheduled Medications:  allopurinol  300 mg Oral Daily   aspirin EC  325  mg Oral Daily   Or   aspirin  324 mg Per Tube Daily   bisacodyl  10 mg Oral Daily   Or   bisacodyl  10 mg Rectal Daily   chlorhexidine gluconate (MEDLINE KIT)  15 mL Mouth Rinse BID   Chlorhexidine Gluconate Cloth  6 each Topical Daily   digoxin  0.125 mg Oral Daily   docusate sodium  200 mg Oral Daily   enoxaparin (LOVENOX) injection  30 mg Subcutaneous Q24H   feeding supplement (ENSURE ENLIVE)  237 mL Oral BID BM   insulin aspart  0-24 Units Subcutaneous Q4H   latanoprost  1 drop Both Eyes QHS   levothyroxine  25 mcg Oral q AM   magnesium oxide  400 mg Oral BID   pantoprazole  40 mg Oral Daily   simvastatin  40 mg Oral q1800   sodium chloride flush  10-40 mL Intracatheter Q12H   sodium chloride flush  3 mL Intravenous Q12H   torsemide  40 mg Oral Daily    Infusions:   sodium chloride Stopped (09/14/20 0800)   lactated ringers Stopped (09/15/20 1905)   milrinone 0.125 mcg/kg/min (09/22/20 1900)    PRN Medications: dextrose, levalbuterol, lip balm, metoprolol tartrate, ondansetron (ZOFRAN) IV, oxyCODONE, sodium chloride flush, traMADol   Assessment/Plan   1. Acute Type I Aortic Dissection  - preoperative TEE confirmed evidence of at least moderate to severe aortic insufficiency with the dissection appearing to start just at the commissure between the left and right coronary cusp. Aortic valve tricuspid  - s/p emergent surgical repair 9/30 by Dr. Servando Snare - BP well controlled and stable  2. RV Failure / Post- Cardiotomy Shock  - Prior Echo 08/2010 showed normal LVEF 60-65%, G1DD. RV appeared normal on parasternal images, however on the apical 4-chamber views it appeared mildly dilated and moderately hypokinetic. - prior to recent acute illness, she had notice gradual change in functional capacity over the last 6-8 months. She is an avid walker and noticed increased dyspnea on exertion and inability to finish her exercise walks. Becoming progressively fatigue but no associated CP. - Intra-operative TEE this admit demonstrated normal LVEF 50-55%. RV moderately enlarged w/ mildly reduced systolic function, moderate TR.  - repeat limited post-op echo showed worsening RV function, D-shaped septum, mod-severely dilated RV with moderately decreased systolic function.  Peak RV-RA gradient 43 mmHg. - Remains very tenuous from RV standpoint. She has had difficulty weaning off milrinone. Co-ox dropped when stopped, therefore restarted. Remains on 0.125 w/ marginal Co-ox at 56%. Will continue for now  - continue digoxin 0.125 - CVP stable at 4. Weight  Climbing . Transitioned to oral diuretics yesterday. Adjust as needed - Renal function stable  - She may need RHC if she does not wean off milrinone successfully  - Agree with Dr. Aundra Dubin that some of her RV dysfunction  may have predated surgery. VQ negative. On initial chest CT, mid to distal RCA not well seen. ? If she has had previous RV infarct. Will do cMRI tomorrow.    3. AKI on Stage III CKD  - resolved. SCr 1.9 on admit  - 1.18 today  4. Pericardial Effusion  - moderate effusion noted on intraoperative TEE, drained in OR - repeat limited echo 10/6 showed no effusion   5. Anemia - expected ABLA from surgery  - hgb 8.5 - management per CT surgery   6. Tricuspid Regurgitation  - moderate TR, likely functional   7. Weakness/debility - PT recommending HHPT/OT  Length of Stay: [place], MD  09/23/2020, 10:18 AM  Advanced Heart Failure Team Pager [phone] (M-F; [place])  Please contact [facility] for night-coverage after hours (4p -7a ) and weekends on [email or web address]

## 2020-09-24 ENCOUNTER — Inpatient Hospital Stay (HOSPITAL_COMMUNITY): Payer: Medicare HMO

## 2020-09-24 DIAGNOSIS — I712 Thoracic aortic aneurysm, without rupture: Secondary | ICD-10-CM

## 2020-09-24 DIAGNOSIS — I50810 Right heart failure, unspecified: Secondary | ICD-10-CM | POA: Diagnosis not present

## 2020-09-24 LAB — BASIC METABOLIC PANEL
Anion gap: 10 (ref 5–15)
BUN: 16 mg/dL (ref 8–23)
CO2: 31 mmol/L (ref 22–32)
Calcium: 8.6 mg/dL — ABNORMAL LOW (ref 8.9–10.3)
Chloride: 99 mmol/L (ref 98–111)
Creatinine, Ser: 1.31 mg/dL — ABNORMAL HIGH (ref 0.44–1.00)
GFR, Estimated: 38 mL/min — ABNORMAL LOW (ref >60)
Glucose, Bld: 102 mg/dL — ABNORMAL HIGH (ref 70–99)
Potassium: 3.7 mmol/L (ref 3.5–5.1)
Sodium: 140 mmol/L (ref 135–145)

## 2020-09-24 LAB — GLUCOSE, CAPILLARY
Glucose-Capillary: 104 mg/dL — ABNORMAL HIGH (ref 70–99)
Glucose-Capillary: 104 mg/dL — ABNORMAL HIGH (ref 70–99)
Glucose-Capillary: 112 mg/dL — ABNORMAL HIGH (ref 70–99)
Glucose-Capillary: 113 mg/dL — ABNORMAL HIGH (ref 70–99)
Glucose-Capillary: 134 mg/dL — ABNORMAL HIGH (ref 70–99)
Glucose-Capillary: 147 mg/dL — ABNORMAL HIGH (ref 70–99)
Glucose-Capillary: 204 mg/dL — ABNORMAL HIGH (ref 70–99)

## 2020-09-24 LAB — COOXEMETRY PANEL
Carboxyhemoglobin: 1.6 % — ABNORMAL HIGH (ref 0.5–1.5)
Methemoglobin: 0.7 % (ref 0.0–1.5)
O2 Saturation: 59 %
Total hemoglobin: 9 g/dL — ABNORMAL LOW (ref 12.0–16.0)

## 2020-09-24 NOTE — Progress Notes (Signed)
CARDIAC REHAB PHASE I   PRE:  Rate/Rhythm: 108 ST  BP:  Supine:   Sitting: 100/43     SaO2: 95% 6L  MODE:  Ambulation: 300 ft   POST:  Rate/Rhythm: 120 ST  BP:  Supine:   Sitting: 105/80    SaO2: 98% 6L  Pt was lying in bed on arrival. Pt stated she was ready to try and walk. She was independent with getting to edge of bed and used sternal precautions. Pt needed some assistance standing up from bed. Pt ambulated with EVA x1 assist. She walked 300 ft with no complaints or concerns. Pt did have to take 2 rest breaks due to mild SOB. Pt tolerated exercise well with the EVA. Encouraged her to try and use the front wheel walker for ambulation next time. Returned pt to recliner and encouraged her to use her IS. Decreased oxygen to 4L after walking.  [place], MS, CEP 09/24/2020 3:32 PM

## 2020-09-24 NOTE — Progress Notes (Signed)
Physical Therapy Treatment Patient Details Name: Danielle Atkins MRN: [id number] DOB: May 07, 1935 Today's Date: 09/24/2020    History of Present Illness 85 yo admitted with chest pain 9/27 with aortic dissection s/p thoracic aortic aneurysm repair on 9/30 who self extubated 10/2 with progressive respiratory failure requiring bipap. PMhx:HTN, HLD, upper GIB, DM, gout    PT Comments    Pt refused gt training and functional mobility after mobilizing with phase 1 cardiac rehab.  Focused on LE strengthening and postural exercises.  Pt continues to fatigue quickly.  HHPT remains appropriate.     Follow Up Recommendations  Home health PT     Equipment Recommendations  Rolling walker with 5" wheels;3in1 (PT)    Recommendations for Other Services       Precautions / Restrictions Precautions Precautions: Sternal;Fall Precaution Booklet Issued: Yes (comment) Precaution Comments: watch sats    Mobility  Bed Mobility               General bed mobility comments: refused OOB after working with cardiac rehab this pm  Transfers                    Ambulation/Gait                 Stairs             Wheelchair Mobility    Modified Rankin (Stroke Patients Only)       Balance                                            Cognition Arousal/Alertness: Awake/alert Behavior During Therapy: WFL for tasks assessed/performed Overall Cognitive Status: Within Functional Limits for tasks assessed                                        Exercises General Exercises - Lower Extremity Ankle Circles/Pumps: AROM;Both;10 reps;Supine Quad Sets: AROM;Both;10 reps;Supine Heel Slides: AROM;Both;10 reps;Supine Hip ABduction/ADduction: AROM;Both;10 reps;Supine Straight Leg Raises: AROM;Both;10 reps;Supine Other Exercises Other Exercises: Incentive Spirometer: 500-71ml quality x 5 reps. Other Exercises: Shoulder elevation; BUE 1x10  reps. Other Exercises: Scapular Retraction: BUE 1x10 reps.    General Comments        Pertinent Vitals/Pain Pain Assessment: No/denies pain    Home Living                      Prior Function            PT Goals (current goals can now be found in the care plan section) Acute Rehab PT Goals Patient Stated Goal: return to gambling at the casino, travel Potential to Achieve Goals: Good Progress towards PT goals: Progressing toward goals    Frequency    Min 3X/week      PT Plan Current plan remains appropriate    Co-evaluation              AM-PAC PT "6 Clicks" Mobility   Outcome Measure  Help needed turning from your back to your side while in a flat bed without using bedrails?: A Little Help needed moving from lying on your back to sitting on the side of a flat bed without using bedrails?: A Little Help needed moving to and from a bed to a chair (including  a wheelchair)?: A Little Help needed standing up from a chair using your arms (e.g., wheelchair or bedside chair)?: A Little Help needed to walk in hospital room?: A Little Help needed climbing 3-5 steps with a railing? : A Little 6 Click Score: 18    End of Session Equipment Utilized During Treatment: Oxygen Activity Tolerance: Patient tolerated treatment well Patient left: with call bell/phone within reach;in chair (Pt in recliner did not check if an alarm was in place.) Nurse Communication: Mobility status PT Visit Diagnosis: Other abnormalities of gait and mobility (R26.89);Difficulty in walking, not elsewhere classified (R26.2)     Time: 9675-9163 PT Time Calculation (min) (ACUTE ONLY): 12 min  Charges:  $Therapeutic Exercise: 8-22 mins                     Erasmo Leventhal , PTA Acute Rehabilitation Services Pager [phone] Office [phone]     Montrelle Eddings Eli Hose 09/24/2020, 5:30 PM

## 2020-09-24 NOTE — Progress Notes (Signed)
PT Cancellation Note  Patient Details Name: Abigale Dorow MRN: [id number] DOB: 1935/07/07   Cancelled Treatment:    Reason Eval/Treat Not Completed: (P) Medical issues which prohibited therapy (Pt with SHOB this am, and nurse deferred tx, will f/u per POC.)   Kallum Jorgensen Eli Hose 09/24/2020, 11:49 AM  Erasmo Leventhal , PTA Acute Rehabilitation Services Pager [phone] Office [phone]

## 2020-09-24 NOTE — Progress Notes (Addendum)
      [address]       [place] [address]             [phone]        11 Days Post-Op Procedure(s) (LRB): TYPE 1 AORTIC DISSECTION WITH REPLACEMENT OF ASCENDING AORTA WITH 32MM HEMASHIELD GRAFT, RESUSPENSION OF AORTIC VALVE AND HYPOTHERMIC  CIRCULATORY ARRST WITH CEREBRAL PREFUSION   (N/A)  Subjective: Patient with complaints of cough and shortness of breath.  Objective: Vital signs in last 24 hours: Temp:  [97.7 F (36.5 C)-99.2 F (37.3 C)] 98.1 F (36.7 C) (10/11 0400) Pulse Rate:  [108-114] 110 (10/11 0400) Cardiac Rhythm: Sinus tachycardia (10/11 0712) Resp:  [18-20] 20 (10/11 0400) BP: (102-111)/(63-70) 108/70 (10/11 0400) SpO2:  [91 %-94 %] 93 % (10/11 0400) Weight:  [74.6 kg] 74.6 kg (10/11 0400)  Pre op weight 73.9 kg Current Weight  09/24/20 74.6 kg      Intake/Output from previous day: 10/10 0701 - 10/11 0700 In: 303.5 [P.O.:240; I.V.:63.5] Out: 1600 [Urine:1600]   Physical Exam:  Cardiovascular: Tachycardic Pulmonary: Diminished bibasilar breath sounds Abdomen: Soft, non tender, bowel sounds present. Extremities: SCDs in place Wounds: Clean and dry.  No erythema or signs of infection.  Lab Results: CBC: Recent Labs    09/22/20 0329 09/23/20 0255  WBC 10.9* 9.7  HGB 8.4* 8.5*  HCT 27.3* 27.2*  PLT 358 388   BMET:  Recent Labs    09/23/20 0255 09/24/20 0255  NA 137 140  K 4.0 3.7  CL 97* 99  CO2 30 31  GLUCOSE 102* 102*  BUN 19 16  CREATININE 1.18* 1.31*  CALCIUM 8.7* 8.6*    PT/INR:  Lab Results  Component Value Date   INR 1.0 09/14/2020   ABG:  INR: Will add last result for INR, ABG once components are confirmed Will add last 4 CBG results once components are confirmed  Assessment/Plan:  1. CV - ST with HR 110's. On Milrinone drip at 0.125 with co ox this am 59%. Also, on Digoxin 0.125 mg daily. Of note, post op echo showed mod-severely dilated RV with moderately decreased systolic function. Per  heart failure, may need RHC if not able to successfully wean off Milrinone drip 2.  Pulmonary - On 6 liter HFNC. CXR this am ordered but not taken yet. Encourage incentive spirometer. 3. Volume Overload - On Torsemide 40 mg daily. Heart failure following. 4.  Expected post op acute blood loss anemia - H and H yesterday 8.5 and 27.2 5. DM-CBGs 120/104/104. On Metformin pre op and will restart closer to discharge. Continue Insulin PRN for now. Pre op HGA1C 5.8 6. Supplement potassium 7. History of hypothyroidism-on Levothyroxine 25 mcg daily  Donielle M ZimmermanPA-C 09/24/2020,8:38 AM  More fatigued this am, has not walked yet Sinus tach - 110-113- on  Dig 0.125  , currently only prn beta blocker -will leave  to cardiology to adjust meds  I have seen and examined [facility] and agree with the above assessment  and plan.  [name] MD Beeper [phone] Office [phone] 09/24/2020 10:15 AM

## 2020-09-24 NOTE — Progress Notes (Addendum)
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  Remains on milrinone 0.125. Co-ox 59% today.   Weight back down within 1 pound of preop weight.    Complaining cough and shortness of breath.     Objective:   Weight Range: 74.6 kg Body mass index is 27.79 kg/m.   Vital Signs:   Temp:  [97.7 F (36.5 C)-99.2 F (37.3 C)] 98.1 F (36.7 C) (10/11 0400) Pulse Rate:  [108-116] 110 (10/11 0400) Resp:  [18-26] 20 (10/11 0400) BP: (102-114)/(63-70) 108/70 (10/11 0400) SpO2:  [91 %-94 %] 93 % (10/11 0400) Weight:  [74.6 kg] 74.6 kg (10/11 0400) Last BM Date: 09/23/20 (1840)  Weight change: Filed Weights   09/22/20 0553 09/23/20 0344 09/24/20 0400  Weight: 74.1 kg 76 kg 74.6 kg    Intake/Output:   Intake/Output Summary (Last 24 hours) at 09/24/2020 0810 Last data filed at 09/24/2020 0426 Gross per 24 hour  Intake 63.51 ml  Output 1600 ml  Net -1536.49 ml      Physical Exam   CVP ~5 General: Coughing sitting up right in bed.  HEENT: normal Neck: supple. no JVD. Carotids 2+ bilat; no bruits. No lymphadenopathy or thryomegaly appreciated. Cor: PMI nondisplaced. Regular rate & rhythm. No rubs, gallops. Sternal incision approximated except lower portion with dressing. RV lift 2/6 TR  Lungs: Decreased in the bases on 6 liters [place].  Abdomen: soft, nontender, nondistended. No hepatosplenomegaly. No bruits or masses. Good bowel sounds. Extremities: no cyanosis, clubbing, rash, edema Neuro: alert & orientedx3, cranial nerves grossly intact. moves all 4 extremities w/o difficulty. Affect pleasant  Telemetry   Sinus Tach 110s   Labs    CBC Recent Labs    09/22/20 0329 09/23/20 0255  WBC 10.9* 9.7  HGB 8.4* 8.5*  HCT 27.3*  27.2*  MCV 74.4* 74.9*  PLT 358 997   Basic Metabolic Panel Recent Labs    09/23/20 0255 09/24/20 0255  NA 137 140  K 4.0 3.7  CL 97* 99  CO2 30 31  GLUCOSE 102* 102*  BUN 19 16  CREATININE 1.18* 1.31*  CALCIUM 8.7* 8.6*   Liver Function Tests No results for input(s): AST, ALT, ALKPHOS, BILITOT, PROT, ALBUMIN in the last 72 hours. No results for input(s): LIPASE, AMYLASE in the last 72 hours. Cardiac Enzymes No results for input(s): CKTOTAL, CKMB, CKMBINDEX, TROPONINI in the last 72 hours.  BNP: BNP (last 3 results) Recent Labs    09/13/20 1129  BNP 388.1*    ProBNP (last 3 results) No results for input(s): PROBNP in the last 8760 hours.   D-Dimer No results for input(s): DDIMER in the last 72 hours. Hemoglobin A1C No results for input(s): HGBA1C in the last 72 hours. Fasting Lipid Panel No results for input(s): CHOL, HDL, LDLCALC, TRIG, CHOLHDL, LDLDIRECT in the last 72 hours. Thyroid Function Tests No results for input(s): TSH, T4TOTAL, T3FREE, THYROIDAB in the last 72 hours.  Invalid input(s): FREET3  Other results:   Imaging    No results found.   Medications:     Scheduled Medications: . allopurinol  300 mg Oral Daily  . aspirin EC  325 mg Oral Daily   Or  . aspirin  324 mg Per Tube Daily  . bisacodyl  10 mg  Oral Daily   Or  . bisacodyl  10 mg Rectal Daily  . chlorhexidine gluconate (MEDLINE KIT)  15 mL Mouth Rinse BID  . Chlorhexidine Gluconate Cloth  6 each Topical Daily  . digoxin  0.125 mg Oral Daily  . docusate sodium  200 mg Oral Daily  . enoxaparin (LOVENOX) injection  30 mg Subcutaneous Q24H  . feeding supplement (ENSURE ENLIVE)  237 mL Oral BID BM  . insulin aspart  0-24 Units Subcutaneous Q4H  . latanoprost  1 drop Both Eyes QHS  . levothyroxine  25 mcg Oral q AM  . magnesium oxide  400 mg Oral BID  . pantoprazole  40 mg Oral Daily  . simvastatin  40 mg Oral q1800  . sodium chloride flush  10-40 mL Intracatheter Q12H  .  sodium chloride flush  3 mL Intravenous Q12H  . torsemide  40 mg Oral Daily    Infusions: . sodium chloride Stopped (09/14/20 0800)  . lactated ringers Stopped (09/15/20 1905)  . milrinone 0.125 mcg/kg/min (09/22/20 1900)    PRN Medications: dextrose, levalbuterol, lip balm, metoprolol tartrate, ondansetron (ZOFRAN) IV, oxyCODONE, sodium chloride flush, traMADol   Assessment/Plan   1. Acute Type I Aortic Dissection  - preoperative TEE confirmed evidence of at least moderate to severe aortic insufficiency with the dissection appearing to start just at the commissure between the left and right coronary cusp. Aortic valve tricuspid  - s/p emergent surgical repair 9/30 by Dr. Servando Snare - BP stable.   2. RV Failure / Post- Cardiotomy Shock  - Prior Echo 08/2010 showed normal LVEF 60-65%, G1DD. RV appeared normal on parasternal images, however on the apical 4-chamber views it appeared mildly dilated and moderately hypokinetic. - prior to recent acute illness, she had notice gradual change in functional capacity over the last 6-8 months. She is an avid walker and noticed increased dyspnea on exertion and inability to finish her exercise walks. Becoming progressively fatigue but no associated CP. - Intra-operative TEE this admit demonstrated normal LVEF 50-55%. RV moderately enlarged w/ mildly reduced systolic function, moderate TR.  - repeat limited post-op echo showed worsening RV function, D-shaped septum, mod-severely dilated RV with moderately decreased systolic function.  Peak RV-RA gradient 43 mmHg. - Remains very tenuous from RV standpoint. She has had difficulty weaning off milrinone.  - Remains on milrinone  0.125 mcg . CO-OX 59%.  - CVP 5 today. Continue torsemide 40 mg daily.  - continue digoxin 0.125 - - She may need RHC if she does not wean off milrinone successfully  - Agree with Dr. Aundra Dubin that some of her RV dysfunction may have predated surgery. VQ negative. On initial chest  CT, mid to distal RCA not well seen. ? If she has had previous RV infarct.  - Hold off on MRI with cough and shortness of breath. CVP not elevated. Get CXR now.    3. AKI on Stage III CKD  - resolved. SCr 1.9 on admit  - Creatinine 1.3 today.   4. Pericardial Effusion  - moderate effusion noted on intraoperative TEE, drained in OR - repeat limited echo 10/6 showed no effusion   5. Anemia - expected ABLA from surgery  - No CBC today.  - management per CT surgery   6. Tricuspid Regurgitation  - moderate TR, likely functional   7. Weakness/debility - PT recommending HHPT/OT  Length of Stay: [place], NP  09/24/2020, 8:10 AM  Advanced Heart Failure Team Pager [phone] (M-F; 7a -  4p)  Please contact [facility] for night-coverage after hours (4p -7a ) and weekends on [email or web address]  Patient seen and examined with the above-signed Advanced Practice Provider and/or Housestaff. I personally reviewed laboratory data, imaging studies and relevant notes. I independently examined the patient and formulated the important aspects of the plan. I have edited the note to reflect any of my changes or salient points. I have personally discussed the plan with the patient and/or family.  More SOB this am with cough. CXR improved. Felt better throughout the day but still weak. Able to [employer] some. Co-ox 59% on milrinone 0.125. CVP 5. Weight at baseline  General:  Sitting in chair. Weak appearing HEENT: normal Neck: supple. No JVD  Carotids 2+ bilat; no bruits. No lymphadenopathy or thryomegaly appreciated. Cor: Chest wound ok PMI nondisplaced. Regular tachy +RV lift . Lungs: coarse  Abdomen: soft, nontender, nondistended. No hepatosplenomegaly. No bruits or masses. Good bowel sounds. Extremities: no cyanosis, clubbing, rash, edema Neuro: alert & orientedx3, cranial nerves grossly intact. moves all 4 extremities w/o difficulty. Affect pleasant  Remains tenuous. I remain concerned  about her RV. Continue milrinone today -possibly stop tomorrow.. Plan cMRI tomorrow. Likely RHC on Wednesday with Dr. Aundra Dubin. Would avoid b-blocker given degree of RV dysfunction. Continue digoxin.   Glori Bickers, MD  6:12 PM

## 2020-09-24 NOTE — Plan of Care (Signed)
  Problem: Education: Goal: Knowledge of General Education information will improve Description: Including pain rating scale, medication(s)/side effects and non-pharmacologic comfort measures Outcome: Progressing   Problem: Nutrition: Goal: Adequate nutrition will be maintained Outcome: Progressing   

## 2020-09-24 NOTE — Progress Notes (Signed)
Came earlier but pt declined due to fatigue/SOB. Will f/u as able. Has PT today too. Yves Dill CES, ACSM 11:45 AM 09/24/2020

## 2020-09-25 ENCOUNTER — Inpatient Hospital Stay (HOSPITAL_COMMUNITY): Payer: Medicare HMO

## 2020-09-25 DIAGNOSIS — I712 Thoracic aortic aneurysm, without rupture: Secondary | ICD-10-CM | POA: Diagnosis not present

## 2020-09-25 DIAGNOSIS — I50810 Right heart failure, unspecified: Secondary | ICD-10-CM | POA: Diagnosis not present

## 2020-09-25 LAB — BASIC METABOLIC PANEL
Anion gap: 10 (ref 5–15)
BUN: 18 mg/dL (ref 8–23)
CO2: 28 mmol/L (ref 22–32)
Calcium: 8 mg/dL — ABNORMAL LOW (ref 8.9–10.3)
Chloride: 101 mmol/L (ref 98–111)
Creatinine, Ser: 1.22 mg/dL — ABNORMAL HIGH (ref 0.44–1.00)
GFR, Estimated: 41 mL/min — ABNORMAL LOW (ref >60)
Glucose, Bld: 90 mg/dL (ref 70–99)
Potassium: 3.4 mmol/L — ABNORMAL LOW (ref 3.5–5.1)
Sodium: 139 mmol/L (ref 135–145)

## 2020-09-25 LAB — COOXEMETRY PANEL
Carboxyhemoglobin: 1.5 % (ref 0.5–1.5)
Carboxyhemoglobin: 1.6 % — ABNORMAL HIGH (ref 0.5–1.5)
Methemoglobin: 0.7 % (ref 0.0–1.5)
Methemoglobin: 1 % (ref 0.0–1.5)
O2 Saturation: 49.7 %
O2 Saturation: 50.1 %
Total hemoglobin: 9.2 g/dL — ABNORMAL LOW (ref 12.0–16.0)
Total hemoglobin: 9.4 g/dL — ABNORMAL LOW (ref 12.0–16.0)

## 2020-09-25 LAB — GLUCOSE, CAPILLARY
Glucose-Capillary: 127 mg/dL — ABNORMAL HIGH (ref 70–99)
Glucose-Capillary: 159 mg/dL — ABNORMAL HIGH (ref 70–99)
Glucose-Capillary: 85 mg/dL (ref 70–99)
Glucose-Capillary: 95 mg/dL (ref 70–99)

## 2020-09-25 MED ORDER — SODIUM CHLORIDE 0.9 % IV BOLUS
250.0000 mL | Freq: Once | INTRAVENOUS | Status: AC
  Administered 2020-09-25: 250 mL via INTRAVENOUS

## 2020-09-25 MED ORDER — POTASSIUM CHLORIDE CRYS ER 20 MEQ PO TBCR
40.0000 meq | EXTENDED_RELEASE_TABLET | Freq: Once | ORAL | Status: AC
  Administered 2020-09-25: 40 meq via ORAL
  Filled 2020-09-25: qty 2

## 2020-09-25 MED ORDER — GADOBUTROL 1 MMOL/ML IV SOLN
10.0000 mL | Freq: Once | INTRAVENOUS | Status: AC | PRN
  Administered 2020-09-25: 10 mL via INTRAVENOUS

## 2020-09-25 NOTE — Progress Notes (Signed)
Nutrition Follow-up  RD working remotely.  DOCUMENTATION CODES:   Not applicable  INTERVENTION:   - Continue Ensure Enlive po BID, each supplement provides 350 kcal and 20 grams of protein  - Add snacks BID between meals  - Encourage adequate PO intake  NUTRITION DIAGNOSIS:   Inadequate oral intake related to decreased appetite as evidenced by meal completion < 50%, per patient/family report.  Progressing  GOAL:   Patient will meet greater than or equal to 90% of their needs  Progressing  MONITOR:   PO intake, Supplement acceptance, Labs, Weight trends, Skin, I & O's  REASON FOR ASSESSMENT:   Ventilator    ASSESSMENT:   85 year old female who presented to the ED on 9/30 with SOB. PMH of gout, PUD, HTN, anemia, TIA. Pt found to have type I aortic dissection.  09/30 - s/p thoracic ascending aneurysm repair 10/02 - self-extubated, clear liquid diet 10/03 - NPO 10/05 - clear liquid diet, later Heart Healthy diet  Noted plan for RHC tomorrow.  Spoke with pt via phone call to room. Pt reports that appetite is okay but not as good as it is when she is at home. Pt reports that she is drinking "some" Ensure Enlive supplements. RD encouraged PO intake and supplement consumption to aid in healing and maintaining lean muscle mass. Pt expresses understanding. Pt reports that her breakfast tray is often cold once she wakes up and gets ready to eat. RD will order snacks BID between meals. Pt likes peaches and graham crackers with peanut butter.  Admit weight: 73.9 kg Current weight: 71.8 kg  Pt with no edema noted per flowsheet documentation.  Meal Completion: 25-50%  Medications reviewed and include: dulcolax, colace, Ensure Enlive BID, SSI q 4 hours, magnesium oxide, protonix  Labs reviewed: potassium 3.4 CBG's: 103-127 x 24 hours  Diet Order:   Diet Order            Diet Heart Room service appropriate? Yes; Fluid consistency: Thin  Diet effective now                  EDUCATION NEEDS:   Education needs have been addressed  Skin:  Skin Assessment: Skin Integrity Issues: Incisions: chest x 2  Last BM:  09/25/20 medium type 7  Height:   Ht Readings from Last 1 Encounters:  09/13/20 5' 4.5" (1.638 m)    Weight:   Wt Readings from Last 1 Encounters:  09/26/20 71.8 kg    BMI:  Body mass index is 26.75 kg/m.  Estimated Nutritional Needs:   Kcal:  1700-1900  Protein:  90-110 grams  Fluid:  1.7-1.9 L    Gaynell Face, MS, RD, LDN Inpatient Clinical Dietitian Please see AMiON for contact information.

## 2020-09-25 NOTE — Progress Notes (Signed)
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  Remains on milrinone 0.125. Co-ox 50% today. CVP 2-3  Feels Ok. Remains weak. Coughing better. Denies SOB, orthopnea or PND.    Objective:   Weight Range: 73.9 kg Body mass index is 27.53 kg/m.   Vital Signs:   Temp:  [97.2 F (36.2 C)-98.6 F (37 C)] 97.8 F (36.6 C) (10/12 0412) Pulse Rate:  [108-116] 113 (10/12 0412) Resp:  [17-24] 23 (10/12 0412) BP: (104-117)/(64-86) 106/64 (10/12 0412) SpO2:  [92 %-95 %] 93 % (10/12 0412) Weight:  [73.9 kg] 73.9 kg (10/12 0500) Last BM Date: 09/23/20  Weight change: Filed Weights   09/23/20 0344 09/24/20 0400 09/25/20 0500  Weight: 76 kg 74.6 kg 73.9 kg    Intake/Output:   Intake/Output Summary (Last 24 hours) at 09/25/2020 0536 Last data filed at 09/25/2020 0400 Gross per 24 hour  Intake --  Output 500 ml  Net -500 ml      Physical Exam    General:  Weak appearing. No resp difficulty HEENT: normal Neck: supple. no JVD. Carotids 2+ bilat; no bruits. No lymphadenopathy or thryomegaly appreciated. Cor: PMI nondisplaced. Surgical wound ok  Regular tachy. + RV lift. 2/6 TR Lungs: clear Abdomen: soft, nontender, nondistended. No hepatosplenomegaly. No bruits or masses. Good bowel sounds. Extremities: no cyanosis, clubbing, rash, edema Neuro: alert & orientedx3, cranial nerves grossly intact. moves all 4 extremities w/o difficulty. Affect pleasant  Telemetry   Sinus tach 110-115 Personally reviewed  Labs    CBC Recent Labs    09/23/20 0255  WBC 9.7  HGB 8.5*  HCT 27.2*  MCV 74.9*  PLT 224   Basic Metabolic Panel Recent Labs    09/24/20 0255 09/25/20 0420  NA 140 139  K 3.7 3.4*  CL 99 101  CO2 31 28  GLUCOSE  102* 90  BUN 16 18  CREATININE 1.31* 1.22*  CALCIUM 8.6* 8.0*   Liver Function Tests No results for input(s): AST, ALT, ALKPHOS, BILITOT, PROT, ALBUMIN in the last 72 hours. No results for input(s): LIPASE, AMYLASE in the last 72 hours. Cardiac Enzymes No results for input(s): CKTOTAL, CKMB, CKMBINDEX, TROPONINI in the last 72 hours.  BNP: BNP (last 3 results) Recent Labs    09/13/20 1129  BNP 388.1*    ProBNP (last 3 results) No results for input(s): PROBNP in the last 8760 hours.   D-Dimer No results for input(s): DDIMER in the last 72 hours. Hemoglobin A1C No results for input(s): HGBA1C in the last 72 hours. Fasting Lipid Panel No results for input(s): CHOL, HDL, LDLCALC, TRIG, CHOLHDL, LDLDIRECT in the last 72 hours. Thyroid Function Tests No results for input(s): TSH, T4TOTAL, T3FREE, THYROIDAB in the last 72 hours.  Invalid input(s): FREET3  Other results:   Imaging    DG CHEST PORT 1 VIEW  Result Date: 09/24/2020 CLINICAL DATA:  Dyspnea EXAM: PORTABLE CHEST 1 VIEW COMPARISON:  Multiple priors, most recent 09/23/2020 FINDINGS: Improved aeration lungs. Persistent left basilar opacities. Subtle chronic prominence of the central bronchovascular markings. No visible pleural effusions or pneumothorax on this limited single AP portable radiograph. Similar enlarged cardiac silhouette. Left subclavian approach PICC with the tip projecting at the right atrium. IMPRESSION: Similar cardiomegaly. Overall, improved aeration of the lungs with  persistent left basilar opacity, which may represent atelectasis, aspiration, and/or pneumonia. Electronically Signed   By: Margaretha Sheffield MD   On: 09/24/2020 10:34     Medications:     Scheduled Medications: . allopurinol  300 mg Oral Daily  . aspirin EC  325 mg Oral Daily   Or  . aspirin  324 mg Per Tube Daily  . bisacodyl  10 mg Oral Daily   Or  . bisacodyl  10 mg Rectal Daily  . chlorhexidine gluconate (MEDLINE KIT)  15  mL Mouth Rinse BID  . Chlorhexidine Gluconate Cloth  6 each Topical Daily  . digoxin  0.125 mg Oral Daily  . docusate sodium  200 mg Oral Daily  . enoxaparin (LOVENOX) injection  30 mg Subcutaneous Q24H  . feeding supplement (ENSURE ENLIVE)  237 mL Oral BID BM  . insulin aspart  0-24 Units Subcutaneous Q4H  . latanoprost  1 drop Both Eyes QHS  . levothyroxine  25 mcg Oral q AM  . magnesium oxide  400 mg Oral BID  . pantoprazole  40 mg Oral Daily  . simvastatin  40 mg Oral q1800  . sodium chloride flush  10-40 mL Intracatheter Q12H  . sodium chloride flush  3 mL Intravenous Q12H  . torsemide  40 mg Oral Daily    Infusions: . sodium chloride Stopped (09/14/20 0800)  . lactated ringers Stopped (09/15/20 1905)  . milrinone 0.125 mcg/kg/min (09/24/20 0931)    PRN Medications: dextrose, levalbuterol, lip balm, metoprolol tartrate, ondansetron (ZOFRAN) IV, oxyCODONE, sodium chloride flush, traMADol   Assessment/Plan   1. Acute Type I Aortic Dissection  - preoperative TEE confirmed evidence of at least moderate to severe aortic insufficiency with the dissection appearing to start just at the commissure between the left and right coronary cusp. Aortic valve tricuspid  - s/p emergent surgical repair 9/30 by Dr. Servando Snare - Stable.   2. RV Failure / Post- Cardiotomy Shock  - Prior Echo 08/2010 showed normal LVEF 60-65%, G1DD. RV appeared normal on parasternal images, however on the apical 4-chamber views it appeared mildly dilated and moderately hypokinetic. - prior to recent acute illness, she had notice gradual change in functional capacity over the last 6-8 months. She is an avid walker and noticed increased dyspnea on exertion and inability to finish her exercise walks. Becoming progressively fatigue but no associated CP. - Intra-operative TEE this admit demonstrated normal LVEF 50-55%. RV moderately enlarged w/ mildly reduced systolic function, moderate TR.  - repeat limited post-op  echo showed worsening RV function, D-shaped septum, mod-severely dilated RV with moderately decreased systolic function.  Peak RV-RA gradient 43 mmHg. - Remains very tenuous from RV standpoint. She has had difficulty weaning off milrinone.  - Remains on milrinone  0.125 mcg . CO-OX 50%.  Will repeat. May need to increase milrinone  - continue digoxin 0.125 - CVP low. Needs more preload. Hold torsemide. May need to give some fluid back.  - Agree with Dr. Aundra Dubin that some of her RV dysfunction may have predated surgery. VQ negative. On initial chest CT, mid to distal RCA not well seen. ? If she has had previous RV infarct.  - Cardiac MRI today. RHC tomorrow with Dr. Aundra Dubin   3. AKI on Stage III CKD  - resolved. SCr 1.9 on admit  - Creatinine 1.22 today.   4. Pericardial Effusion  - moderate effusion noted on intraoperative TEE, drained in OR - repeat limited echo 10/6 showed no effusion  - no  change  5. Anemia - expected ABLA from surgery  - hgb 9.2 today - management per CT surgery   6. Tricuspid Regurgitation  - moderate TR, likely functional  - management of RV failure as above  7. Weakness/debility - PT recommending HHPT/OT  8. Hypokalemia - will supp  Length of Stay: [place], MD  09/25/2020, 5:36 AM  Advanced Heart Failure Team Pager [phone] (M-F; 7a - 4p)  Please contact [facility] for night-coverage after hours (4p -7a ) and weekends on [email or web address]

## 2020-09-25 NOTE — Care Management Important Message (Signed)
Important Message  Patient Details  Name: Danielle Atkins MRN: [id number] Date of Birth: Apr 19, 1935   Medicare Important Message Given:  Yes     [name] [address]ine Circle 09/25/2020, 2:33 PM

## 2020-09-25 NOTE — Progress Notes (Signed)
   09/25/20 0800  Assess: MEWS Score  BP (!) 100/58  Pulse Rate (!) 115  ECG Heart Rate (!) 115  Resp (!) 23  Level of Consciousness Alert  SpO2 93 %  O2 Device HFNC  O2 Flow Rate (L/min) 4 L/min  Assess: MEWS Score  MEWS Temp 0  MEWS Systolic 1  MEWS Pulse 2  MEWS RR 1  MEWS LOC 0  MEWS Score 4  MEWS Score Color Red  Assess: if the MEWS score is Yellow or Red  Were vital signs taken at a resting state? Yes  Focused Assessment No change from prior assessment  Early Detection of Sepsis Score *See Row Information* Low  MEWS guidelines implemented *See Row Information* No, previously red, continue vital signs every 4 hours  Take Vital Signs  Increase Vital Sign Frequency  Red: Q 1hr X 4 then Q 4hr X 4, if remains red, continue Q 4hrs  Notify: Charge Nurse/RN  Name of Charge Nurse/RN Notified Marita [place]  Date Charge Nurse/RN Notified 09/25/20  Time Charge Nurse/RN Notified 0815  Notify: Provider  Provider Name/Title Jadene Pierini PA at the bedside  Notification Type Face-to-face  Response No new orders  Document  Progress note created (see row info) Yes

## 2020-09-25 NOTE — Progress Notes (Signed)
CARDIAC REHAB PHASE I   PRE:  Rate/Rhythm: 110 ST    BP: sitting 93/81    SaO2: 90-91 4L  MODE:  Ambulation: 340 ft   POST:  Rate/Rhythm: 123 ST    BP: sitting 116/71     SaO2: 93 4L  Pt in bed on arrival, has been awaiting MRI. Able to move to EOB mostly independently, min assist toward edge. Stood with rocking and min assist. To BSC then ambulated hall with RW and 4L O2, gait belt, standby assist. Rest x3 due to DOE. Tired after walk, to recliner for lunch.  [place], ACSM 09/25/2020 2:42 PM

## 2020-09-25 NOTE — Plan of Care (Signed)

## 2020-09-25 NOTE — Progress Notes (Signed)
Pacer wires removed per order- Pt instructed to remain on bedrest x1hr, BP 110/68, HR ST at 109.

## 2020-09-25 NOTE — Progress Notes (Addendum)
[address]       [place] [address]             [phone]      12 Days Post-Op Procedure(s) (LRB): TYPE 1 AORTIC DISSECTION WITH REPLACEMENT OF ASCENDING AORTA WITH 32MM HEMASHIELD GRAFT, RESUSPENSION OF AORTIC VALVE AND HYPOTHERMIC  CIRCULATORY ARRST WITH CEREBRAL PREFUSION   (N/A) Subjective: Feels pretty well overall, slow but steady improvement in physical recovery  Objective: Vital signs in last 24 hours: Temp:  [97.2 F (36.2 C)-98.6 F (37 C)] 97.8 F (36.6 C) (10/12 0412) Pulse Rate:  [108-116] 113 (10/12 0412) Cardiac Rhythm: Sinus tachycardia (10/12 0710) Resp:  [17-24] 23 (10/12 0412) BP: (104-117)/(64-86) 106/64 (10/12 0412) SpO2:  [92 %-95 %] 93 % (10/12 0412) Weight:  [73.9 kg] 73.9 kg (10/12 0500)  Hemodynamic parameters for last 24 hours:    Intake/Output from previous day: 10/11 0701 - 10/12 0700 In: -  Out: 500 [Urine:500] Intake/Output this shift: No intake/output data recorded.  General appearance: alert, cooperative and no distress Heart: regular rate and rhythm Lungs: clear to auscultation bilaterally Abdomen: benign Extremities: no edema Wound: incis healing well  Lab Results: Recent Labs    09/23/20 0255  WBC 9.7  HGB 8.5*  HCT 27.2*  PLT 388   BMET:  Recent Labs    09/24/20 0255 09/25/20 0420  NA 140 139  K 3.7 3.4*  CL 99 101  CO2 31 28  GLUCOSE 102* 90  BUN 16 18  CREATININE 1.31* 1.22*  CALCIUM 8.6* 8.0*    PT/INR: No results for input(s): LABPROT, INR in the last 72 hours. ABG    Component Value Date/Time   PHART 7.478 (H) 09/15/2020 2136   HCO3 26.8 09/15/2020 2136   TCO2 28 09/15/2020 2136   ACIDBASEDEF 2.0 09/14/2020 0048   O2SAT 50.1 09/25/2020 0630   CBG (last 3)  Recent Labs    09/24/20 1957 09/25/20 0008 09/25/20 0442  GLUCAP 134* 85 95    Meds Scheduled Meds: . allopurinol  300 mg Oral Daily  . aspirin EC  325 mg Oral Daily   Or  . aspirin  324 mg Per Tube Daily   . bisacodyl  10 mg Oral Daily   Or  . bisacodyl  10 mg Rectal Daily  . chlorhexidine gluconate (MEDLINE KIT)  15 mL Mouth Rinse BID  . Chlorhexidine Gluconate Cloth  6 each Topical Daily  . digoxin  0.125 mg Oral Daily  . docusate sodium  200 mg Oral Daily  . enoxaparin (LOVENOX) injection  30 mg Subcutaneous Q24H  . feeding supplement (ENSURE ENLIVE)  237 mL Oral BID BM  . insulin aspart  0-24 Units Subcutaneous Q4H  . latanoprost  1 drop Both Eyes QHS  . levothyroxine  25 mcg Oral q AM  . magnesium oxide  400 mg Oral BID  . pantoprazole  40 mg Oral Daily  . simvastatin  40 mg Oral q1800  . sodium chloride flush  10-40 mL Intracatheter Q12H  . sodium chloride flush  3 mL Intravenous Q12H   Continuous Infusions: . sodium chloride Stopped (09/14/20 0800)  . lactated ringers Stopped (09/15/20 1905)  . milrinone 0.125 mcg/kg/min (09/24/20 0931)   PRN Meds:.dextrose, levalbuterol, lip balm, metoprolol tartrate, ondansetron (ZOFRAN) IV, oxyCODONE, sodium chloride flush, traMADol  Xrays DG CHEST PORT 1 VIEW  Result Date: 09/24/2020 CLINICAL DATA:  Dyspnea EXAM: PORTABLE CHEST 1 VIEW COMPARISON:  Multiple priors, most recent 09/23/2020  FINDINGS: Improved aeration lungs. Persistent left basilar opacities. Subtle chronic prominence of the central bronchovascular markings. No visible pleural effusions or pneumothorax on this limited single AP portable radiograph. Similar enlarged cardiac silhouette. Left subclavian approach PICC with the tip projecting at the right atrium. IMPRESSION: Similar cardiomegaly. Overall, improved aeration of the lungs with persistent left basilar opacity, which may represent atelectasis, aspiration, and/or pneumonia. Electronically Signed   By: Margaretha Sheffield MD   On: 09/24/2020 10:34    Assessment/Plan: S/P Procedure(s) (LRB): TYPE 1 AORTIC DISSECTION WITH REPLACEMENT OF ASCENDING AORTA WITH 32MM HEMASHIELD GRAFT, RESUSPENSION OF AORTIC VALVE AND HYPOTHERMIC   CIRCULATORY ARRST WITH CEREBRAL PREFUSION   (N/A)   1 afeb, VSS, tachy at times, Co-ox 40 , AHF team managing meds, for cardiac MRI, RHC tomorrow. May be dry at this point with low CVP 2 sats good on 4 liters HFNC 3 creat improved trend, needs K+ replacement 4 BS control is good 5 push rehab as able  LOS: 12 days    Danielle Giovanni PA-C Pager [phone] 09/25/2020  Now off diuretic with cvp low  Still with base line hr 100-112 sinus at rest  Feels better today then yesterday  I have seen and examined Danielle Atkins and agree with the above assessment  and plan.  [name] MD Beeper [phone] Office [phone] 09/25/2020 2:38 PM

## 2020-09-26 ENCOUNTER — Inpatient Hospital Stay (HOSPITAL_COMMUNITY): Payer: Medicare HMO

## 2020-09-26 DIAGNOSIS — I50811 Acute right heart failure: Secondary | ICD-10-CM | POA: Diagnosis not present

## 2020-09-26 LAB — GLUCOSE, CAPILLARY
Glucose-Capillary: 103 mg/dL — ABNORMAL HIGH (ref 70–99)
Glucose-Capillary: 104 mg/dL — ABNORMAL HIGH (ref 70–99)
Glucose-Capillary: 116 mg/dL — ABNORMAL HIGH (ref 70–99)
Glucose-Capillary: 137 mg/dL — ABNORMAL HIGH (ref 70–99)
Glucose-Capillary: 143 mg/dL — ABNORMAL HIGH (ref 70–99)
Glucose-Capillary: 76 mg/dL (ref 70–99)
Glucose-Capillary: 81 mg/dL (ref 70–99)

## 2020-09-26 LAB — BASIC METABOLIC PANEL
Anion gap: 11 (ref 5–15)
BUN: 20 mg/dL (ref 8–23)
CO2: 27 mmol/L (ref 22–32)
Calcium: 9 mg/dL (ref 8.9–10.3)
Chloride: 99 mmol/L (ref 98–111)
Creatinine, Ser: 1.3 mg/dL — ABNORMAL HIGH (ref 0.44–1.00)
GFR, Estimated: 38 mL/min — ABNORMAL LOW (ref >60)
Glucose, Bld: 128 mg/dL — ABNORMAL HIGH (ref 70–99)
Potassium: 4 mmol/L (ref 3.5–5.1)
Sodium: 137 mmol/L (ref 135–145)

## 2020-09-26 LAB — COOXEMETRY PANEL
Carboxyhemoglobin: 1.5 % (ref 0.5–1.5)
Methemoglobin: 0.7 % (ref 0.0–1.5)
O2 Saturation: 57.6 %
Total hemoglobin: 10 g/dL — ABNORMAL LOW (ref 12.0–16.0)

## 2020-09-26 LAB — MRSA PCR SCREENING: MRSA by PCR: NEGATIVE

## 2020-09-26 NOTE — TOC Transition Note (Signed)
Transition of Care [facility]) - CM/SW Discharge Note   Patient Details  Name: Danielle Atkins MRN: [id number] Date of Birth: 1935/05/14  [facility] CM/SW Contact:  Zenon Mayo, RN Phone Number: 09/26/2020, 2:43 PM   Clinical Narrative:    NCM offered patient choice for HHPT, she states she does not have a preference, NCM made referral to [facility] with [facility], she is able to take referral.  Soc will begin 24 to 48 hrs post dc.  NCM made referral for rolling walker to [employer], they will bring to patient room prior to dc.   Final next level of care: [place] Barriers to Discharge: Continued Medical Work up   Patient Goals and CMS Choice Patient states their goals for this hospitalization and ongoing recovery are:: to get better CMS Medicare.gov Compare Post Acute Care list provided to:: Patient Choice offered to / list presented to : Patient  Discharge Placement                       Discharge Plan and Services                DME Arranged: Walker rolling DME Agency: [employer] Date DME Agency Contacted: 09/26/20 Time DME Agency Contacted: [phone] Representative spoke with at DME Agency: [employer] rep [place]: PT [place]:  ([place]) Date [place]: 09/26/20 Time [place]: [place] Representative spoke with at [place]: [place] ([place]) Interventions     Readmission Risk Interventions No flowsheet data found.

## 2020-09-26 NOTE — Progress Notes (Addendum)
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  Remains on milrinone 0.125. Co-ox 58% today. Diuretics held yesterday for low CVP (2-3). CVP not set up currently. Wt down additional 3 lb.   cMRI yesterday w/ mild-mod RV dysfunction. LVEF normal. No definite LGE was noted.  No complaints currently. Resting comfortably in bed. Remains on 4L [place].    cMRI 10/12  IMPRESSION: 1. Technically difficult study with significant artifact from pacing wires. 2.  Normal LV size with EF 66%, normal wall motion. 3. Moderately dilated RV with EF 36%, mild-moderate systolic dysfunction. 4. Delayed enhancement images were difficult due to artifact, but no definite LGE was noted. Therefore, no definitive evidence for prior MI, myocarditis, or infiltrative disease.    Objective:   Weight Range: 71.8 kg Body mass index is 26.75 kg/m.   Vital Signs:   Temp:  [97.6 F (36.4 C)-98.4 F (36.9 C)] 97.6 F (36.4 C) (10/13 0334) Pulse Rate:  [107-117] 108 (10/12 2038) Resp:  [19-30] 19 (10/12 2038) BP: (93-110)/(58-81) 109/65 (10/12 2038) SpO2:  [92 %-97 %] 97 % (10/12 2038) Weight:  [71.8 kg] 71.8 kg (10/13 0500) Last BM Date: 09/25/20  Weight change: Filed Weights   09/24/20 0400 09/25/20 0500 09/26/20 0500  Weight: 74.6 kg 73.9 kg 71.8 kg    Intake/Output:   Intake/Output Summary (Last 24 hours) at 09/26/2020 0715 Last data filed at 09/25/2020 2223 Gross per 24 hour  Intake 13 ml  Output 1 ml  Net 12 ml      Physical Exam    General:  Fatigue appearing elderly female. No resp difficulty HEENT: normal Neck: supple. no JVD. Carotids 2+ bilat; no bruits. No lymphadenopathy or thryomegaly appreciated. Cor: PMI nondisplaced. Sternal  incision site ok  Regular rhythm, tachy rate. 2/6 TR Lungs: clear, no wheezing  Abdomen: soft, nontender, nondistended. No hepatosplenomegaly. No bruits or masses. Good bowel sounds. Extremities: no cyanosis, clubbing, rash, edema Neuro: alert & orientedx3, cranial nerves grossly intact. moves all 4 extremities w/o difficulty. Affect pleasant  Telemetry   Sinus tach 110s Personally reviewed  Labs    CBC No results for input(s): WBC, NEUTROABS, HGB, HCT, MCV, PLT in the last 72 hours. Basic Metabolic Panel Recent Labs    09/24/20 0255 09/25/20 0420  NA 140 139  K 3.7 3.4*  CL 99 101  CO2 31 28  GLUCOSE 102* 90  BUN 16 18  CREATININE 1.31* 1.22*  CALCIUM 8.6* 8.0*   Liver Function Tests No results for input(s): AST, ALT, ALKPHOS, BILITOT, PROT, ALBUMIN in the last 72 hours. No results for input(s): LIPASE, AMYLASE in the last 72 hours. Cardiac Enzymes No results for input(s): CKTOTAL, CKMB, CKMBINDEX, TROPONINI in the last 72 hours.  BNP: BNP (last 3 results) Recent Labs    09/13/20 1129  BNP 388.1*    ProBNP (last 3 results) No results for input(s): PROBNP in the last 8760 hours.   D-Dimer No results for input(s): DDIMER in the last 72 hours. Hemoglobin A1C No results for input(s): HGBA1C in the last 72 hours. Fasting Lipid Panel No results for input(s): CHOL, HDL, LDLCALC, TRIG, CHOLHDL, LDLDIRECT in the last 72 hours. Thyroid Function Tests No results for input(s): TSH, T4TOTAL, T3FREE, THYROIDAB in the last 72 hours.  Invalid  input(s): FREET3  Other results:   Imaging    MR CARDIAC MORPHOLOGY W WO CONTRAST  Result Date: 09/25/2020 CLINICAL DATA:  RV failure EXAM: CARDIAC MRI TECHNIQUE: The patient was scanned on a 1.5 Tesla GE magnet. A dedicated cardiac coil was used. Functional imaging was done using Fiesta sequences. 2,3, and 4 chamber views were done to assess for RWMA's. Modified Simpson's rule using a short axis stack was used to calculate an  ejection fraction on a dedicated work Conservation officer, nature. The patient received 10 cc of Gadavist. 10 minutes inversion recovery sequences were used to assess for infiltration and scar tissue. CONTRAST:  Gadavist 10 cc FINDINGS: S/p sternotomy. There was artifact from the pacing wires. Small bilateral pleural effusions with associated atelectasis on the left. S/p ascending aorta and arch replacement with residual dissection noted in the descending thoracic aorta. Normal left ventricular size and wall thickness. Normal wall motion with EF 66%. D-shaped septum suggestive of RV pressure/volume overload. RV moderately dilated and moderately dysfunctional, EF 36%. Moderate left and right atrial enlargement. There was probably moderate tricuspid regurgitation. No mitral regurgitation. Tricuspid aortic valve. suspect mild aortic insufficiency with no significant stenosis. Flow images to quantify aortic insufficiency were degraded by artifact from pacing wires. Delayed enhancement images were difficult due to artifact, but I did not see any definitive late gadolinium enhancement (LGE). Measurements: Calculated LV EF 66% (volumes not accurate). Calculated RV EF 36% (volumes not accurate. IMPRESSION: 1. Technically difficult study with significant artifact from pacing wires. 2.  Normal LV size with EF 66%, normal wall motion. 3. Moderately dilated RV with EF 36%, mild-moderate systolic dysfunction. 4. Delayed enhancement images were difficult due to artifact, but no definite LGE was noted. Therefore, no definitive evidence for prior MI, myocarditis, or infiltrative disease. Earlyn Sylvan Electronically Signed   By: Loralie Champagne M.D.   On: 09/25/2020 18:14     Medications:     Scheduled Medications: . allopurinol  300 mg Oral Daily  . aspirin EC  325 mg Oral Daily   Or  . aspirin  324 mg Per Tube Daily  . bisacodyl  10 mg Oral Daily   Or  . bisacodyl  10 mg Rectal Daily  . chlorhexidine gluconate  (MEDLINE KIT)  15 mL Mouth Rinse BID  . Chlorhexidine Gluconate Cloth  6 each Topical Daily  . digoxin  0.125 mg Oral Daily  . docusate sodium  200 mg Oral Daily  . enoxaparin (LOVENOX) injection  30 mg Subcutaneous Q24H  . feeding supplement (ENSURE ENLIVE)  237 mL Oral BID BM  . insulin aspart  0-24 Units Subcutaneous Q4H  . latanoprost  1 drop Both Eyes QHS  . levothyroxine  25 mcg Oral q AM  . magnesium oxide  400 mg Oral BID  . pantoprazole  40 mg Oral Daily  . simvastatin  40 mg Oral q1800  . sodium chloride flush  10-40 mL Intracatheter Q12H  . sodium chloride flush  3 mL Intravenous Q12H    Infusions: . sodium chloride Stopped (09/14/20 0800)  . lactated ringers Stopped (09/15/20 1905)  . milrinone 0.125 mcg/kg/min (09/25/20 1755)    PRN Medications: dextrose, levalbuterol, lip balm, metoprolol tartrate, ondansetron (ZOFRAN) IV, oxyCODONE, sodium chloride flush, traMADol   Assessment/Plan   1. Acute Type I Aortic Dissection  - preoperative TEE confirmed evidence of at least moderate to severe aortic insufficiency with the dissection appearing to start just at the commissure between the left and right coronary  cusp. Aortic valve tricuspid  - s/p emergent surgical repair 9/30 by Dr. Tyrone Sage - Stable.   2. RV Failure / Post- Cardiotomy Shock  - Prior Echo 08/2010 showed normal LVEF 60-65%, G1DD. RV appeared normal on parasternal images, however on the apical 4-chamber views it appeared mildly dilated and moderately hypokinetic. - prior to recent acute illness, she had notice gradual change in functional capacity over the last 6-8 months. She is an avid walker and noticed increased dyspnea on exertion and inability to finish her exercise walks. Becoming progressively fatigue but no associated CP. - Intra-operative TEE this admit demonstrated normal LVEF 50-55%. RV moderately enlarged w/ mildly reduced systolic function, moderate TR.  - repeat limited post-op echo showed  worsening RV function, D-shaped septum, mod-severely dilated RV with moderately decreased systolic function.  Peak RV-RA gradient 43 mmHg. - Remains very tenuous from RV standpoint. She has had difficulty weaning off milrinone.  - Remains on milrinone  0.125 mcg. CO-OX 58%.   - continue digoxin 0.125 - continue to hold diuretics for low CVP. Needs more preload.  May need to give some fluid back.  - Suspect some of her RV dysfunction may have predated surgery. VQ negative. On initial chest CT, mid to distal RCA not well seen. ? If she has had previous RV infarct.  - Cardiac MRI showed no definite LGE. LVEF normal 66%. RV moderately dilated w/ mild-moderate systolic dysfunction, EF 36%.  - plan RHC with Dr. Shirlee Latch   3. AKI on Stage III CKD  - resolved. SCr 1.9 on admit  - 1.22 yesterday, repeat BMP pending  4. Pericardial Effusion  - moderate effusion noted on intraoperative TEE, drained in OR - repeat limited echo 10/6 showed no effusion  - no change   5. Anemia - expected ABLA from surgery  - management per CT surgery   6. Tricuspid Regurgitation  - moderate TR, likely functional  - management of RV failure as above  7. Weakness/debility - PT recommending HHPT/OT  8. Hypokalemia - BMP pending   Length of Stay: [address] PA-C  09/26/2020, 7:15 AM  Advanced Heart Failure Team Pager [phone] (M-F; 7a - 4p)  Please contact [facility] for night-coverage after hours (4p -7a ) and weekends on [email or web address]  Patient seen with PA, agree with the above note.   CVP 4-5 today with co-ox 58% on milrinone 0.125.  Cardiac MRI yesterday was difficult study, but LV EF 66% and RV moderately dilated with EF 36%.  D-shaped septum.  No LGE noted, so no evidence for inferior/RV infarction.   Still on 4L oxygen by [place].  Last CXR with left base atelectasis.   General: NAD Neck: No JVD, no thyromegaly or thyroid nodule.  Lungs: Clear to auscultation bilaterally with normal  respiratory effort. CV: Nondisplaced PMI.  Heart regular S1/S2, no S3/S4, no murmur.  No peripheral edema.   Abdomen: Soft, nontender, no hepatosplenomegaly, no distention.  Skin: Intact without lesions or rashes.  Neurologic: Alert and oriented x 3.  Psych: Normal affect. Extremities: No clubbing or cyanosis.  HEENT: Normal.   Seems to be making improvement.  Hold diuretics today and will stop milrinone.  RHC tomorrow morning off milrinone, discussed risks/benefits and she agrees to procedure.  Pending creatinine today, will send BMET.   Cause for RV failure still uncertain, RV EF 36% with moderately dilated RV on cMRI.  No evidence for inferior/RV infarction by LGE.  V/Q scan was negative for PE. Suspect she had  some pre-existing RV dysfunction, ?from COPD.   Still on oxygen with normal CVP.  Will repeat pCXR today, had atelectasis on last CXR.   Loralie Champagne 09/26/2020 8:37 AM

## 2020-09-26 NOTE — Progress Notes (Addendum)
[address]       [place] [address]             [phone]      13 Days Post-Op Procedure(s) (LRB): TYPE 1 AORTIC DISSECTION WITH REPLACEMENT OF ASCENDING AORTA WITH 32MM HEMASHIELD GRAFT, RESUSPENSION OF AORTIC VALVE AND HYPOTHERMIC  CIRCULATORY ARRST WITH CEREBRAL PREFUSION   (N/A) Subjective: Feels "better every day"  Objective: Vital signs in last 24 hours: Temp:  [97.6 F (36.4 C)-98.4 F (36.9 C)] 97.6 F (36.4 C) (10/13 0334) Pulse Rate:  [107-117] 108 (10/12 2038) Cardiac Rhythm: Sinus tachycardia (10/13 0443) Resp:  [19-30] 19 (10/12 2038) BP: (93-110)/(58-81) 109/65 (10/12 2038) SpO2:  [92 %-97 %] 97 % (10/12 2038) Weight:  [71.8 kg] 71.8 kg (10/13 0500)  Hemodynamic parameters for last 24 hours:    Intake/Output from previous day: 10/12 0701 - 10/13 0700 In: 13 [I.V.:13] Out: 1 [Stool:1] Intake/Output this shift: No intake/output data recorded.  General appearance: alert, cooperative and no distress Heart: regular rate and rhythm and tachy Lungs: fair air exchange throughout Abdomen: benign Extremities: no edema- PAS in place Wound: incis without signs of infection  Lab Results: No results for input(s): WBC, HGB, HCT, PLT in the last 72 hours. BMET: Recent Labs    09/24/20 0255 09/25/20 0420  NA 140 139  K 3.7 3.4*  CL 99 101  CO2 31 28  GLUCOSE 102* 90  BUN 16 18  CREATININE 1.31* 1.22*  CALCIUM 8.6* 8.0*    PT/INR: No results for input(s): LABPROT, INR in the last 72 hours. ABG    Component Value Date/Time   PHART 7.478 (H) 09/15/2020 2136   HCO3 26.8 09/15/2020 2136   TCO2 28 09/15/2020 2136   ACIDBASEDEF 2.0 09/14/2020 0048   O2SAT 57.6 09/26/2020 0445   CBG (last 3)  Recent Labs    09/25/20 2118 09/26/20 0043 09/26/20 0341  GLUCAP 127* 116* 103*    Meds Scheduled Meds: . allopurinol  300 mg Oral Daily  . aspirin EC  325 mg Oral Daily   Or  . aspirin  324 mg Per Tube Daily  . bisacodyl  10 mg  Oral Daily   Or  . bisacodyl  10 mg Rectal Daily  . chlorhexidine gluconate (MEDLINE KIT)  15 mL Mouth Rinse BID  . Chlorhexidine Gluconate Cloth  6 each Topical Daily  . digoxin  0.125 mg Oral Daily  . docusate sodium  200 mg Oral Daily  . enoxaparin (LOVENOX) injection  30 mg Subcutaneous Q24H  . feeding supplement (ENSURE ENLIVE)  237 mL Oral BID BM  . insulin aspart  0-24 Units Subcutaneous Q4H  . latanoprost  1 drop Both Eyes QHS  . levothyroxine  25 mcg Oral q AM  . magnesium oxide  400 mg Oral BID  . pantoprazole  40 mg Oral Daily  . simvastatin  40 mg Oral q1800  . sodium chloride flush  10-40 mL Intracatheter Q12H  . sodium chloride flush  3 mL Intravenous Q12H   Continuous Infusions: . sodium chloride Stopped (09/14/20 0800)  . lactated ringers Stopped (09/15/20 1905)  . milrinone 0.125 mcg/kg/min (09/25/20 1755)   PRN Meds:.dextrose, levalbuterol, lip balm, metoprolol tartrate, ondansetron (ZOFRAN) IV, oxyCODONE, sodium chloride flush, traMADol  Xrays DG CHEST PORT 1 VIEW  Result Date: 09/24/2020 CLINICAL DATA:  Dyspnea EXAM: PORTABLE CHEST 1 VIEW COMPARISON:  Multiple priors, most recent 09/23/2020 FINDINGS: Improved aeration lungs. Persistent left basilar opacities. Subtle  chronic prominence of the central bronchovascular markings. No visible pleural effusions or pneumothorax on this limited single AP portable radiograph. Similar enlarged cardiac silhouette. Left subclavian approach PICC with the tip projecting at the right atrium. IMPRESSION: Similar cardiomegaly. Overall, improved aeration of the lungs with persistent left basilar opacity, which may represent atelectasis, aspiration, and/or pneumonia. Electronically Signed   By: Margaretha Sheffield MD   On: 09/24/2020 10:34   MR CARDIAC MORPHOLOGY W WO CONTRAST  Result Date: 09/25/2020 CLINICAL DATA:  RV failure EXAM: CARDIAC MRI TECHNIQUE: The patient was scanned on a 1.5 Tesla GE magnet. A dedicated cardiac coil was  used. Functional imaging was done using Fiesta sequences. 2,3, and 4 chamber views were done to assess for RWMA's. Modified Simpson's rule using a short axis stack was used to calculate an ejection fraction on a dedicated work Conservation officer, nature. The patient received 10 cc of Gadavist. 10 minutes inversion recovery sequences were used to assess for infiltration and scar tissue. CONTRAST:  Gadavist 10 cc FINDINGS: S/p sternotomy. There was artifact from the pacing wires. Small bilateral pleural effusions with associated atelectasis on the left. S/p ascending aorta and arch replacement with residual dissection noted in the descending thoracic aorta. Normal left ventricular size and wall thickness. Normal wall motion with EF 66%. D-shaped septum suggestive of RV pressure/volume overload. RV moderately dilated and moderately dysfunctional, EF 36%. Moderate left and right atrial enlargement. There was probably moderate tricuspid regurgitation. No mitral regurgitation. Tricuspid aortic valve. suspect mild aortic insufficiency with no significant stenosis. Flow images to quantify aortic insufficiency were degraded by artifact from pacing wires. Delayed enhancement images were difficult due to artifact, but I did not see any definitive late gadolinium enhancement (LGE). Measurements: Calculated LV EF 66% (volumes not accurate). Calculated RV EF 36% (volumes not accurate. IMPRESSION: 1. Technically difficult study with significant artifact from pacing wires. 2.  Normal LV size with EF 66%, normal wall motion. 3. Moderately dilated RV with EF 36%, mild-moderate systolic dysfunction. 4. Delayed enhancement images were difficult due to artifact, but no definite LGE was noted. Therefore, no definitive evidence for prior MI, myocarditis, or infiltrative disease. Dalton Mclean Electronically Signed   By: Loralie Champagne M.D.   On: 09/25/2020 18:14    Assessment/Plan: S/P Procedure(s) (LRB): TYPE 1 AORTIC DISSECTION  WITH REPLACEMENT OF ASCENDING AORTA WITH 32MM HEMASHIELD GRAFT, RESUSPENSION OF AORTIC VALVE AND HYPOTHERMIC  CIRCULATORY ARRST WITH CEREBRAL PREFUSION   (N/A)  1 AHF conts to eval RV failure/maximize medical therapy- MRI results noted 2 afeb, SBP 90-110's,  sinus tachy, Co-Ox 57, milrinone at .125 3 sats ok on 4 l HFNC 4 sugars adeq controlled 5 RHC today  LOS: 13 days    John Giovanni PA-C Pager [phone] 09/26/2020  Further evaluation treatment by heart failure team Still on milrinone  I have seen and examined [facility] and agree with the above assessment  and plan.  [name] MD Beeper [phone] Office [phone] 09/26/2020 8:32 AM

## 2020-09-26 NOTE — Progress Notes (Signed)
CARDIAC REHAB PHASE I   PRE:  Rate/Rhythm: 117 ST    BP: sitting 103/67    SaO2: 93 3L  MODE:  Ambulation: 350 ft (50 ft then 300 ft)   POST:  Rate/Rhythm: 129 ST    BP: sitting 115/70     SaO2: 93 4L  Pt more tired today, blames MRI experience. HR higher at rest and with activity. Began walking on 3L but SaO2 down to 88 therefore increased to 4L. SLower pace, SOB and fatigued, rest x3 and also stop for [facility] after 50 ft.  Used RW, assist x1 and 4L O2.  [id number]  Darrick Meigs CES, ACSM 09/26/2020 10:19 AM

## 2020-09-26 NOTE — H&P (View-Only) (Signed)
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  Remains on milrinone 0.125. Co-ox 58% today. Diuretics held yesterday for low CVP (2-3). CVP not set up currently. Wt down additional 3 lb.   cMRI yesterday w/ mild-mod RV dysfunction. LVEF normal. No definite LGE was noted.  No complaints currently. Resting comfortably in bed. Remains on 4L [place].    cMRI 10/12  IMPRESSION: 1. Technically difficult study with significant artifact from pacing wires. 2.  Normal LV size with EF 66%, normal wall motion. 3. Moderately dilated RV with EF 36%, mild-moderate systolic dysfunction. 4. Delayed enhancement images were difficult due to artifact, but no definite LGE was noted. Therefore, no definitive evidence for prior MI, myocarditis, or infiltrative disease.    Objective:   Weight Range: 71.8 kg Body mass index is 26.75 kg/m.   Vital Signs:   Temp:  [97.6 F (36.4 C)-98.4 F (36.9 C)] 97.6 F (36.4 C) (10/13 0334) Pulse Rate:  [107-117] 108 (10/12 2038) Resp:  [19-30] 19 (10/12 2038) BP: (93-110)/(58-81) 109/65 (10/12 2038) SpO2:  [92 %-97 %] 97 % (10/12 2038) Weight:  [71.8 kg] 71.8 kg (10/13 0500) Last BM Date: 09/25/20  Weight change: Filed Weights   09/24/20 0400 09/25/20 0500 09/26/20 0500  Weight: 74.6 kg 73.9 kg 71.8 kg    Intake/Output:   Intake/Output Summary (Last 24 hours) at 09/26/2020 0715 Last data filed at 09/25/2020 2223 Gross per 24 hour  Intake 13 ml  Output 1 ml  Net 12 ml      Physical Exam    General:  Fatigue appearing elderly female. No resp difficulty HEENT: normal Neck: supple. no JVD. Carotids 2+ bilat; no bruits. No lymphadenopathy or thryomegaly appreciated. Cor: PMI nondisplaced. Sternal  incision site ok  Regular rhythm, tachy rate. 2/6 TR Lungs: clear, no wheezing  Abdomen: soft, nontender, nondistended. No hepatosplenomegaly. No bruits or masses. Good bowel sounds. Extremities: no cyanosis, clubbing, rash, edema Neuro: alert & orientedx3, cranial nerves grossly intact. moves all 4 extremities w/o difficulty. Affect pleasant  Telemetry   Sinus tach 110s Personally reviewed  Labs    CBC No results for input(s): WBC, NEUTROABS, HGB, HCT, MCV, PLT in the last 72 hours. Basic Metabolic Panel Recent Labs    09/24/20 0255 09/25/20 0420  NA 140 139  K 3.7 3.4*  CL 99 101  CO2 31 28  GLUCOSE 102* 90  BUN 16 18  CREATININE 1.31* 1.22*  CALCIUM 8.6* 8.0*   Liver Function Tests No results for input(s): AST, ALT, ALKPHOS, BILITOT, PROT, ALBUMIN in the last 72 hours. No results for input(s): LIPASE, AMYLASE in the last 72 hours. Cardiac Enzymes No results for input(s): CKTOTAL, CKMB, CKMBINDEX, TROPONINI in the last 72 hours.  BNP: BNP (last 3 results) Recent Labs    09/13/20 1129  BNP 388.1*    ProBNP (last 3 results) No results for input(s): PROBNP in the last 8760 hours.   D-Dimer No results for input(s): DDIMER in the last 72 hours. Hemoglobin A1C No results for input(s): HGBA1C in the last 72 hours. Fasting Lipid Panel No results for input(s): CHOL, HDL, LDLCALC, TRIG, CHOLHDL, LDLDIRECT in the last 72 hours. Thyroid Function Tests No results for input(s): TSH, T4TOTAL, T3FREE, THYROIDAB in the last 72 hours.  Invalid  input(s): FREET3  Other results:   Imaging    MR CARDIAC MORPHOLOGY W WO CONTRAST  Result Date: 09/25/2020 CLINICAL DATA:  RV failure EXAM: CARDIAC MRI TECHNIQUE: The patient was scanned on a 1.5 Tesla GE magnet. A dedicated cardiac coil was used. Functional imaging was done using Fiesta sequences. 2,3, and 4 chamber views were done to assess for RWMA's. Modified Simpson's rule using a short axis stack was used to calculate an  ejection fraction on a dedicated work Conservation officer, nature. The patient received 10 cc of Gadavist. 10 minutes inversion recovery sequences were used to assess for infiltration and scar tissue. CONTRAST:  Gadavist 10 cc FINDINGS: S/p sternotomy. There was artifact from the pacing wires. Small bilateral pleural effusions with associated atelectasis on the left. S/p ascending aorta and arch replacement with residual dissection noted in the descending thoracic aorta. Normal left ventricular size and wall thickness. Normal wall motion with EF 66%. D-shaped septum suggestive of RV pressure/volume overload. RV moderately dilated and moderately dysfunctional, EF 36%. Moderate left and right atrial enlargement. There was probably moderate tricuspid regurgitation. No mitral regurgitation. Tricuspid aortic valve. suspect mild aortic insufficiency with no significant stenosis. Flow images to quantify aortic insufficiency were degraded by artifact from pacing wires. Delayed enhancement images were difficult due to artifact, but I did not see any definitive late gadolinium enhancement (LGE). Measurements: Calculated LV EF 66% (volumes not accurate). Calculated RV EF 36% (volumes not accurate. IMPRESSION: 1. Technically difficult study with significant artifact from pacing wires. 2.  Normal LV size with EF 66%, normal wall motion. 3. Moderately dilated RV with EF 36%, mild-moderate systolic dysfunction. 4. Delayed enhancement images were difficult due to artifact, but no definite LGE was noted. Therefore, no definitive evidence for prior MI, myocarditis, or infiltrative disease. Renad Jenniges Electronically Signed   By: Loralie Champagne M.D.   On: 09/25/2020 18:14     Medications:     Scheduled Medications: . allopurinol  300 mg Oral Daily  . aspirin EC  325 mg Oral Daily   Or  . aspirin  324 mg Per Tube Daily  . bisacodyl  10 mg Oral Daily   Or  . bisacodyl  10 mg Rectal Daily  . chlorhexidine gluconate  (MEDLINE KIT)  15 mL Mouth Rinse BID  . Chlorhexidine Gluconate Cloth  6 each Topical Daily  . digoxin  0.125 mg Oral Daily  . docusate sodium  200 mg Oral Daily  . enoxaparin (LOVENOX) injection  30 mg Subcutaneous Q24H  . feeding supplement (ENSURE ENLIVE)  237 mL Oral BID BM  . insulin aspart  0-24 Units Subcutaneous Q4H  . latanoprost  1 drop Both Eyes QHS  . levothyroxine  25 mcg Oral q AM  . magnesium oxide  400 mg Oral BID  . pantoprazole  40 mg Oral Daily  . simvastatin  40 mg Oral q1800  . sodium chloride flush  10-40 mL Intracatheter Q12H  . sodium chloride flush  3 mL Intravenous Q12H    Infusions: . sodium chloride Stopped (09/14/20 0800)  . lactated ringers Stopped (09/15/20 1905)  . milrinone 0.125 mcg/kg/min (09/25/20 1755)    PRN Medications: dextrose, levalbuterol, lip balm, metoprolol tartrate, ondansetron (ZOFRAN) IV, oxyCODONE, sodium chloride flush, traMADol   Assessment/Plan   1. Acute Type I Aortic Dissection  - preoperative TEE confirmed evidence of at least moderate to severe aortic insufficiency with the dissection appearing to start just at the commissure between the left and right coronary  cusp. Aortic valve tricuspid  - s/p emergent surgical repair 9/30 by Dr. Servando Snare - Stable.   2. RV Failure / Post- Cardiotomy Shock  - Prior Echo 08/2010 showed normal LVEF 60-65%, G1DD. RV appeared normal on parasternal images, however on the apical 4-chamber views it appeared mildly dilated and moderately hypokinetic. - prior to recent acute illness, she had notice gradual change in functional capacity over the last 6-8 months. She is an avid walker and noticed increased dyspnea on exertion and inability to finish her exercise walks. Becoming progressively fatigue but no associated CP. - Intra-operative TEE this admit demonstrated normal LVEF 50-55%. RV moderately enlarged w/ mildly reduced systolic function, moderate TR.  - repeat limited post-op echo showed  worsening RV function, D-shaped septum, mod-severely dilated RV with moderately decreased systolic function.  Peak RV-RA gradient 43 mmHg. - Remains very tenuous from RV standpoint. She has had difficulty weaning off milrinone.  - Remains on milrinone  0.125 mcg. CO-OX 58%.   - continue digoxin 0.125 - continue to hold diuretics for low CVP. Needs more preload.  May need to give some fluid back.  - Suspect some of her RV dysfunction may have predated surgery. VQ negative. On initial chest CT, mid to distal RCA not well seen. ? If she has had previous RV infarct.  - Cardiac MRI showed no definite LGE. LVEF normal 66%. RV moderately dilated w/ mild-moderate systolic dysfunction, EF 06%.  - plan RHC with Dr. Aundra Dubin   3. AKI on Stage III CKD  - resolved. SCr 1.9 on admit  - 1.22 yesterday, repeat BMP pending  4. Pericardial Effusion  - moderate effusion noted on intraoperative TEE, drained in OR - repeat limited echo 10/6 showed no effusion  - no change   5. Anemia - expected ABLA from surgery  - management per CT surgery   6. Tricuspid Regurgitation  - moderate TR, likely functional  - management of RV failure as above  7. Weakness/debility - PT recommending HHPT/OT  8. Hypokalemia - BMP pending   Length of Stay: [address] PA-C  09/26/2020, 7:15 AM  Advanced Heart Failure Team Pager [phone] (M-F; [place])  Please contact [facility] for night-coverage after hours (4p -7a ) and weekends on [email or web address]  Patient seen with PA, agree with the above note.   CVP 4-5 today with co-ox 58% on milrinone 0.125.  Cardiac MRI yesterday was difficult study, but LV EF 66% and RV moderately dilated with EF 36%.  D-shaped septum.  No LGE noted, so no evidence for inferior/RV infarction.   Still on 4L oxygen by [place].  Last CXR with left base atelectasis.   General: NAD Neck: No JVD, no thyromegaly or thyroid nodule.  Lungs: Clear to auscultation bilaterally with normal  respiratory effort. CV: Nondisplaced PMI.  Heart regular S1/S2, no S3/S4, no murmur.  No peripheral edema.   Abdomen: Soft, nontender, no hepatosplenomegaly, no distention.  Skin: Intact without lesions or rashes.  Neurologic: Alert and oriented x 3.  Psych: Normal affect. Extremities: No clubbing or cyanosis.  HEENT: Normal.   Seems to be making improvement.  Hold diuretics today and will stop milrinone.  RHC tomorrow morning off milrinone, discussed risks/benefits and she agrees to procedure.  Pending creatinine today, will send BMET.   Cause for RV failure still uncertain, RV EF 15% with moderately dilated RV on cMRI.  No evidence for inferior/RV infarction by LGE.  V/Q scan was negative for PE. Suspect she had  some pre-existing RV dysfunction, ?from COPD.   Still on oxygen with normal CVP.  Will repeat pCXR today, had atelectasis on last CXR.   Loralie Champagne 09/26/2020 8:37 AM

## 2020-09-26 NOTE — Plan of Care (Signed)

## 2020-09-26 NOTE — Progress Notes (Signed)
Patient's daughter asked that patient can move to [place]. Because patient's son lives in [place] and he wanted to take care of her in her house. Case manager Neoma Laming already arranged [facility] today. Patient's daughter wanted to talk to doctor tomorrow for discuss this matter. [employer]

## 2020-09-26 NOTE — Progress Notes (Signed)
Physical Therapy Treatment Patient Details Name: Danielle Atkins MRN: [id number] DOB: 1935-11-21 Today's Date: 09/26/2020    History of Present Illness 85 yo admitted with chest pain 9/27 with aortic dissection s/p thoracic aortic aneurysm repair on 9/30 who self extubated 10/2 with progressive respiratory failure requiring bipap. PMhx:HTN, HLD, upper GIB, DM, gout    PT Comments    Pt seated in recliner on arrival this session.  Pt required assistance to stand and mobilize.  She reports feeling better this pm.  Increased WOB noted with activity.  Returned to bed after session.  Continue to recommend HHPT.     Follow Up Recommendations  Home health PT     Equipment Recommendations  Rolling walker with 5" wheels;3in1 (PT)    Recommendations for Other Services       Precautions / Restrictions Precautions Precautions: Sternal;Fall Precaution Booklet Issued: Yes (comment) Precaution Comments: watch sats Restrictions Weight Bearing Restrictions: Yes (sternal precautions)    Mobility  Bed Mobility   Bed Mobility: Sit to Sidelying;Rolling Rolling: Min guard       Sit to sidelying: Min assist General bed mobility comments: Assistance to lift B LEs back to bed against gravity.  Transfers Overall transfer level: Needs assistance Equipment used: Rolling walker (2 wheeled) Transfers: Sit to/from Stand Sit to Stand: Min assist         General transfer comment: Min assistance to boost into standing against gravity this session.  Cues for hands on knees.  Ambulation/Gait Ambulation/Gait assistance: Min guard Gait Distance (Feet): 150 Feet Assistive device: Rolling walker (2 wheeled) Gait Pattern/deviations: Decreased stride length;Trunk flexed;Step-through pattern;Narrow base of support     General Gait Details: Pt required cues for posture and pacing.  Required 6L HFNC during gt training.  Poor waveform this session so unable to read SPO2.  HR stable at 87  bpm.   Stairs             Wheelchair Mobility    Modified Rankin (Stroke Patients Only)       Balance                                            Cognition Arousal/Alertness: Awake/alert Behavior During Therapy: WFL for tasks assessed/performed Overall Cognitive Status: Within Functional Limits for tasks assessed                                        Exercises      General Comments        Pertinent Vitals/Pain Pain Assessment: No/denies pain    Home Living                      Prior Function            PT Goals (current goals can now be found in the care plan section) Acute Rehab PT Goals Patient Stated Goal: return to gambling at the casino, travel Potential to Achieve Goals: Good Progress towards PT goals: Progressing toward goals    Frequency    Min 3X/week      PT Plan Current plan remains appropriate    Co-evaluation              AM-PAC PT "6 Clicks" Mobility   Outcome Measure  Help needed  turning from your back to your side while in a flat bed without using bedrails?: A Little Help needed moving from lying on your back to sitting on the side of a flat bed without using bedrails?: A Little Help needed moving to and from a bed to a chair (including a wheelchair)?: A Little Help needed standing up from a chair using your arms (e.g., wheelchair or bedside chair)?: A Little Help needed to walk in hospital room?: A Little Help needed climbing 3-5 steps with a railing? : A Little 6 Click Score: 18    End of Session Equipment Utilized During Treatment: Gait belt Activity Tolerance: Patient tolerated treatment well Patient left: in bed;with call bell/phone within reach;with bed alarm set;with family/visitor present Nurse Communication: Mobility status PT Visit Diagnosis: Other abnormalities of gait and mobility (R26.89);Difficulty in walking, not elsewhere classified (R26.2)     Time:  4353-9122 PT Time Calculation (min) (ACUTE ONLY): 28 min  Charges:  $Gait Training: 8-22 mins $Therapeutic Activity: 8-22 mins                     Erasmo Leventhal , PTA Acute Rehabilitation Services Pager [phone] Office [phone]     Ryen Rhames Eli Hose 09/26/2020, 5:28 PM

## 2020-09-27 ENCOUNTER — Encounter (HOSPITAL_COMMUNITY)
Admission: EM | Disposition: A | Discharge status: Skilled Nursing Facility | Payer: Self-pay | Source: Home / Self Care | Attending: Cardiothoracic Surgery

## 2020-09-27 ENCOUNTER — Encounter (HOSPITAL_COMMUNITY): Payer: Self-pay | Admitting: Cardiology

## 2020-09-27 ENCOUNTER — Inpatient Hospital Stay (HOSPITAL_COMMUNITY): Payer: Medicare HMO

## 2020-09-27 DIAGNOSIS — I50811 Acute right heart failure: Secondary | ICD-10-CM | POA: Diagnosis not present

## 2020-09-27 HISTORY — PX: RIGHT HEART CATH: CATH118263

## 2020-09-27 LAB — CBC
HCT: 26.8 % — ABNORMAL LOW (ref 36.0–46.0)
Hemoglobin: 8.2 g/dL — ABNORMAL LOW (ref 12.0–15.0)
MCH: 23.2 pg — ABNORMAL LOW (ref 26.0–34.0)
MCHC: 30.6 g/dL (ref 30.0–36.0)
MCV: 75.7 fL — ABNORMAL LOW (ref 80.0–100.0)
Platelets: 444 10*3/uL — ABNORMAL HIGH (ref 150–400)
RBC: 3.54 MIL/uL — ABNORMAL LOW (ref 3.87–5.11)
RDW: 21.5 % — ABNORMAL HIGH (ref 11.5–15.5)
WBC: 9.2 10*3/uL (ref 4.0–10.5)
nRBC: 0 % (ref 0.0–0.2)

## 2020-09-27 LAB — POCT I-STAT EG7
Acid-Base Excess: 7 mmol/L — ABNORMAL HIGH (ref 0.0–2.0)
Acid-Base Excess: 7 mmol/L — ABNORMAL HIGH (ref 0.0–2.0)
Bicarbonate: 31.4 mmol/L — ABNORMAL HIGH (ref 20.0–28.0)
Bicarbonate: 30.7 mmol/L — ABNORMAL HIGH (ref 20.0–28.0)
Calcium, Ion: 1.21 mmol/L (ref 1.15–1.40)
Calcium, Ion: 1.19 mmol/L (ref 1.15–1.40)
HCT: 27 % — ABNORMAL LOW (ref 36.0–46.0)
HCT: 27 % — ABNORMAL LOW (ref 36.0–46.0)
Hemoglobin: 9.2 g/dL — ABNORMAL LOW (ref 12.0–15.0)
Hemoglobin: 9.2 g/dL — ABNORMAL LOW (ref 12.0–15.0)
O2 Saturation: 60 %
O2 Saturation: 60 %
Potassium: 4.3 mmol/L (ref 3.5–5.1)
Potassium: 4.3 mmol/L (ref 3.5–5.1)
Sodium: 139 mmol/L (ref 135–145)
Sodium: 140 mmol/L (ref 135–145)
TCO2: 33 mmol/L — ABNORMAL HIGH (ref 22–32)
TCO2: 32 mmol/L (ref 22–32)
pCO2, Ven: 41.2 mmHg — ABNORMAL LOW (ref 44.0–60.0)
pCO2, Ven: 40.6 mmHg — ABNORMAL LOW (ref 44.0–60.0)
pH, Ven: 7.491 — ABNORMAL HIGH (ref 7.250–7.430)
pH, Ven: 7.487 — ABNORMAL HIGH (ref 7.250–7.430)
pO2, Ven: 29 mmHg — CL (ref 32.0–45.0)
pO2, Ven: 29 mmHg — CL (ref 32.0–45.0)

## 2020-09-27 LAB — BASIC METABOLIC PANEL
Anion gap: 12 (ref 5–15)
BUN: 18 mg/dL (ref 8–23)
CO2: 27 mmol/L (ref 22–32)
Calcium: 9.1 mg/dL (ref 8.9–10.3)
Chloride: 99 mmol/L (ref 98–111)
Creatinine, Ser: 1.22 mg/dL — ABNORMAL HIGH (ref 0.44–1.00)
GFR, Estimated: 41 mL/min — ABNORMAL LOW (ref >60)
Glucose, Bld: 94 mg/dL (ref 70–99)
Potassium: 4.6 mmol/L (ref 3.5–5.1)
Sodium: 138 mmol/L (ref 135–145)

## 2020-09-27 LAB — GLUCOSE, CAPILLARY
Glucose-Capillary: 92 mg/dL (ref 70–99)
Glucose-Capillary: 163 mg/dL — ABNORMAL HIGH (ref 70–99)
Glucose-Capillary: 93 mg/dL (ref 70–99)
Glucose-Capillary: 115 mg/dL — ABNORMAL HIGH (ref 70–99)
Glucose-Capillary: 94 mg/dL (ref 70–99)

## 2020-09-27 LAB — COOXEMETRY PANEL
Carboxyhemoglobin: 1.7 % — ABNORMAL HIGH (ref 0.5–1.5)
Methemoglobin: 0.6 % (ref 0.0–1.5)
O2 Saturation: 69 %
Total hemoglobin: 9.1 g/dL — ABNORMAL LOW (ref 12.0–16.0)

## 2020-09-27 LAB — DIGOXIN LEVEL: Digoxin Level: 0.8 ng/mL — ABNORMAL LOW (ref 1.0–2.0)

## 2020-09-27 SURGERY — RIGHT HEART CATH
Anesthesia: LOCAL

## 2020-09-27 MED ORDER — LIDOCAINE HCL (PF) 1 % IJ SOLN
INTRAMUSCULAR | Status: AC
  Filled 2020-09-27: qty 30

## 2020-09-27 MED ORDER — SODIUM CHLORIDE 0.9 % IV SOLN: INTRAVENOUS | Status: DC

## 2020-09-27 MED ORDER — LIDOCAINE HCL (PF) 1 % IJ SOLN
INTRAMUSCULAR | Status: DC | PRN
  Administered 2020-09-27: 2 mL

## 2020-09-27 MED ORDER — SODIUM CHLORIDE 0.9 % IV SOLN: 250.0000 mL | INTRAVENOUS | Status: DC | PRN

## 2020-09-27 MED ORDER — SODIUM CHLORIDE 0.9% FLUSH: 3.0000 mL | INTRAVENOUS | Status: DC | PRN

## 2020-09-27 MED ORDER — HEPARIN (PORCINE) IN NACL 1000-0.9 UT/500ML-% IV SOLN
INTRAVENOUS | Status: AC
  Filled 2020-09-27: qty 500

## 2020-09-27 MED ORDER — HEPARIN (PORCINE) IN NACL 1000-0.9 UT/500ML-% IV SOLN
INTRAVENOUS | Status: DC | PRN
  Administered 2020-09-27: 500 mL

## 2020-09-27 MED ORDER — SODIUM CHLORIDE 0.9% FLUSH: 3.0000 mL | Freq: Two times a day (BID) | INTRAVENOUS | Status: DC

## 2020-09-27 SURGICAL SUPPLY — 8 items
CATH SWAN GANZ 7F STRAIGHT (CATHETERS) ×1 IMPLANT
GLIDESHEATH SLENDER 7FR .021G (SHEATH) ×1 IMPLANT
PACK CARDIAC CATHETERIZATION (CUSTOM PROCEDURE TRAY) ×2 IMPLANT
PROTECTION STATION PRESSURIZED (MISCELLANEOUS) ×2
STATION PROTECTION PRESSURIZED (MISCELLANEOUS) IMPLANT
TRANSDUCER W/STOPCOCK (MISCELLANEOUS) ×2 IMPLANT
TUBING ART PRESS 72  MALE/FEM (TUBING) ×2
TUBING ART PRESS 72 MALE/FEM (TUBING) IMPLANT

## 2020-09-27 NOTE — Plan of Care (Signed)

## 2020-09-27 NOTE — Plan of Care (Signed)

## 2020-09-27 NOTE — Plan of Care (Signed)
  Problem: Education: Goal: Knowledge of General Education information will improve Description: Including pain rating scale, medication(s)/side effects and non-pharmacologic comfort measures Outcome: Progressing   Problem: Health Behavior/Discharge Planning: Goal: Ability to manage health-related needs will improve Outcome: Progressing   Problem: Clinical Measurements: Goal: Ability to maintain clinical measurements within normal limits will improve Outcome: Progressing   Problem: Clinical Measurements: Goal: Diagnostic test results will improve Outcome: Progressing   Problem: Activity: Goal: Risk for activity intolerance will decrease Outcome: Progressing   Problem: Nutrition: Goal: Adequate nutrition will be maintained Outcome: Progressing   Problem: Pain Managment: Goal: General experience of comfort will improve Outcome: Progressing   Problem: Education: Goal: Understanding of CV disease, CV risk reduction, and recovery process will improve Outcome: Progressing   Problem: Activity: Goal: Ability to return to baseline activity level will improve Outcome: Progressing   Problem: Cardiovascular: Goal: Vascular access site(s) Level 0-1 will be maintained Outcome: Progressing

## 2020-09-27 NOTE — Progress Notes (Signed)
Pt just to bathroom and to recliner. Sts "I'm so tired today, I don't think I can walk". Practiced IS, 750 mL x3. Encouraged her to ambulate later today after she rests.  [place], ACSM 2:13 PM 09/27/2020

## 2020-09-27 NOTE — Progress Notes (Signed)
Advanced Heart Failure Rounding Note  PCP-Cardiologist: Pixie Casino, MD   Subjective:    S/P 09/13/20 Emergency repair of type 1 aortic dissection with replacement of ascending aorta with 32 mm Hemashield graft with resuspension of the aortic valve with circulatory arrest, hypothermic.  Now off milrinone, good cardiac output on RHC (see below).   No complaints currently. Resting comfortably in bed. Remains on 4L [place].   cMRI 10/12  IMPRESSION: 1. Technically difficult study with significant artifact from pacing wires. 2.  Normal LV size with EF 66%, normal wall motion. 3. Moderately dilated RV with EF 36%, mild-moderate systolic dysfunction. 4. Delayed enhancement images were difficult due to artifact, but no definite LGE was noted. Therefore, no definitive evidence for prior MI, myocarditis, or infiltrative disease.  RHC Procedural Findings: Hemodynamics (mmHg) RA mean 6 RV 44/5 PA 47/21, mean 29 PCWP mean 5 Oxygen saturations: PA 60% AO 93% Cardiac Output (Fick) 6.48  Cardiac Index (Fick) 3.61 PVR 3.7 WU Cardiac Output (Thermo) 5.67 Cardiac Index (Thermo) 3.16  PVR 4.23 WU  Objective:   Weight Range: 71.6 kg Body mass index is 26.68 kg/m.   Vital Signs:   Temp:  [97.4 F (36.3 C)-98.6 F (37 C)] 98.2 F (36.8 C) (10/14 0432) Pulse Rate:  [104-118] 118 (10/14 0432) Resp:  [18-29] 18 (10/14 0443) BP: (91-113)/(63-78) 110/78 (10/14 0432) SpO2:  [91 %-95 %] 94 % (10/14 0730) Weight:  [71.6 kg] 71.6 kg (10/14 0500) Last BM Date: 09/25/20  Weight change: Filed Weights   09/25/20 0500 09/26/20 0500 09/27/20 0500  Weight: 73.9 kg 71.8 kg 71.6 kg    Intake/Output:   Intake/Output Summary (Last 24 hours) at 09/27/2020 0914 Last data filed at 09/27/2020 0515 Gross per 24 hour  Intake 221.59 ml  Output 400 ml  Net -178.41 ml      Physical Exam    General: NAD Neck: No JVD, no thyromegaly or thyroid nodule.  Lungs: Clear to auscultation  bilaterally with normal respiratory effort. CV: Nondisplaced PMI.  Heart regular S1/S2, no S3/S4, no murmur.  No peripheral edema.   Abdomen: Soft, nontender, no hepatosplenomegaly, no distention.  Skin: Intact without lesions or rashes.  Neurologic: Alert and oriented x 3.  Psych: Normal affect. Extremities: No clubbing or cyanosis.  HEENT: Normal.    Telemetry   Sinus tachy 110s Personally reviewed  Labs    CBC Recent Labs    09/27/20 0545  WBC 9.2  HGB 8.2*  HCT 26.8*  MCV 75.7*  PLT 147*   Basic Metabolic Panel Recent Labs    09/26/20 0847 09/27/20 0445  NA 137 138  K 4.0 4.6  CL 99 99  CO2 27 27  GLUCOSE 128* 94  BUN 20 18  CREATININE 1.30* 1.22*  CALCIUM 9.0 9.1   Liver Function Tests No results for input(s): AST, ALT, ALKPHOS, BILITOT, PROT, ALBUMIN in the last 72 hours. No results for input(s): LIPASE, AMYLASE in the last 72 hours. Cardiac Enzymes No results for input(s): CKTOTAL, CKMB, CKMBINDEX, TROPONINI in the last 72 hours.  BNP: BNP (last 3 results) Recent Labs    09/13/20 1129  BNP 388.1*    ProBNP (last 3 results) No results for input(s): PROBNP in the last 8760 hours.   D-Dimer No results for input(s): DDIMER in the last 72 hours. Hemoglobin A1C No results for input(s): HGBA1C in the last 72 hours. Fasting Lipid Panel No results for input(s): CHOL, HDL, LDLCALC, TRIG, CHOLHDL, LDLDIRECT in the last  72 hours. Thyroid Function Tests No results for input(s): TSH, T4TOTAL, T3FREE, THYROIDAB in the last 72 hours.  Invalid input(s): FREET3  Other results:   Imaging    CARDIAC CATHETERIZATION  Result Date: 09/27/2020 1. Normal right and left heart filling pressures. 2. Mild-moderate pulmonary arterial hypertension. 3. Preserved cardiac output.   DG CHEST PORT 1 VIEW  Result Date: 09/26/2020 CLINICAL DATA:  Congestive heart failure. EXAM: PORTABLE CHEST 1 VIEW COMPARISON:  September 24, 2020. FINDINGS: Stable cardiomegaly. No  pneumothorax is noted. Right lung is clear. Mild left basilar atelectasis or infiltrate is noted. Bony thorax is unremarkable. Sternotomy wires are noted. Left-sided PICC line is unchanged in position. IMPRESSION: Mild left basilar atelectasis or infiltrate is noted. Electronically Signed   By: Marijo Conception M.D.   On: 09/26/2020 10:33     Medications:     Scheduled Medications: . allopurinol  300 mg Oral Daily  . aspirin EC  325 mg Oral Daily   Or  . aspirin  324 mg Per Tube Daily  . bisacodyl  10 mg Oral Daily   Or  . bisacodyl  10 mg Rectal Daily  . chlorhexidine gluconate (MEDLINE KIT)  15 mL Mouth Rinse BID  . Chlorhexidine Gluconate Cloth  6 each Topical Daily  . digoxin  0.125 mg Oral Daily  . docusate sodium  200 mg Oral Daily  . enoxaparin (LOVENOX) injection  30 mg Subcutaneous Q24H  . feeding supplement  237 mL Oral BID BM  . insulin aspart  0-24 Units Subcutaneous Q4H  . latanoprost  1 drop Both Eyes QHS  . levothyroxine  25 mcg Oral q AM  . magnesium oxide  400 mg Oral BID  . pantoprazole  40 mg Oral Daily  . simvastatin  40 mg Oral q1800  . sodium chloride flush  10-40 mL Intracatheter Q12H  . sodium chloride flush  3 mL Intravenous Q12H    Infusions: . sodium chloride Stopped (09/14/20 0800)  . lactated ringers Stopped (09/15/20 1905)    PRN Medications: dextrose, levalbuterol, lip balm, metoprolol tartrate, ondansetron (ZOFRAN) IV, oxyCODONE, sodium chloride flush, traMADol   Assessment/Plan   1. Acute Type I Aortic Dissection:  Preoperative TEE confirmed evidence of at least moderate to severe aortic insufficiency with the dissection appearing to start just at the commissure between the left and right coronary cusp. Aortic valve tricuspid.  S/p aortic valve repair and ascending aorta replacement 9/30 by Dr. Servando Snare.  Mild AI on followup echo.  2. RV Failure / Post-Cardiotomy Shock: Prior Echo 08/2010 showed normal LVEF 60-65%, G1DD. RV appeared normal on  parasternal images, however on the apical 4-chamber views it appeared mildly dilated and moderately hypokinetic. Prior to recent acute illness, she had notice gradual change in functional capacity over the last 6-8 months. She is an avid walker and noticed increased dyspnea on exertion and inability to finish her exercise walks. Becoming progressively fatigue but no associated CP.  Intra-operative TEE this admit demonstrated normal LVEF 50-55%. RV moderately enlarged w/ mildly reduced systolic function, moderate TR. Repeat limited post-op echo showed worsening RV function, D-shaped septum, mod-severely dilated RV with moderately decreased systolic function, peak RV-RA gradient 43 mmHg.  RV EF 36% with moderately dilated RV on cMRI, no evidence for inferior/RV infarction by LGE.  V/Q scan was negative for PE. Suspect she had some pre-existing RV dysfunction, ?from COPD. She had difficulty weaning off milrinone but now off with excellent cardiac output on RHC today.  Filling pressures  area normal/low.  - continue digoxin 0.125 - continue to hold diuretics for low CVP, likely start po Lasix tomorrow.  3. AKI on Stage III CKD: Resolved, creatinine down to 1.2. 4. Pericardial Effusion: Moderate effusion noted on intraoperative TEE, drained in OR.  Repeat limited echo 10/6 showed no effusion  - no change  5. Anemia: Expected ABLA from surgery  - management per CT surgery  6. Tricuspid Regurgitation: Moderate TR, likely functional  - management of RV failure as above 7. Hypoxemia: She remains on oxygen despite good filling pressures on RHC. ?Underlying lung disease contributing to RV failure.  - I will arrange for high resolution CT chest to rule out ILD, etc.  8. Pulmonary hypertension: Mild pulmonary arterial hypertension on RHC.  This may be due to OSA.  - Will need sleep study as outpatient.   Making improvement, continue to work with PT.   Length of Stay: [place], MD  09/27/2020, 9:14  AM  Advanced Heart Failure Team Pager [phone] (M-F; 7a - 4p)  Please contact [facility] for night-coverage after hours (4p -7a ) and weekends on [email or web address]

## 2020-09-27 NOTE — Progress Notes (Addendum)
[address]       [place] [address]             [phone]      14 Days Post-Op Procedure(s) (LRB): TYPE 1 AORTIC DISSECTION WITH REPLACEMENT OF ASCENDING AORTA WITH 32MM HEMASHIELD GRAFT, RESUSPENSION OF AORTIC VALVE AND HYPOTHERMIC  CIRCULATORY ARRST WITH CEREBRAL PREFUSION   (N/A) Subjective: For RHC today off milrinone- done  Objective: Vital signs in last 24 hours: Temp:  [97.4 F (36.3 C)-98.6 F (37 C)] 98.2 F (36.8 C) (10/14 0432) Pulse Rate:  [104-118] 118 (10/14 0432) Cardiac Rhythm: Sinus tachycardia (10/14 0432) Resp:  [18-29] 18 (10/14 0443) BP: (91-113)/(63-78) 110/78 (10/14 0432) SpO2:  [91 %-95 %] 95 % (10/14 0432) Weight:  [71.6 kg] 71.6 kg (10/14 0500)  Hemodynamic parameters for last 24 hours: CVP:  [4 mmHg-8 mmHg] 6 mmHg  Intake/Output from previous day: 10/13 0701 - 10/14 0700 In: 550.9 [P.O.:400; I.V.:150.9] Out: 400 [Urine:400] Intake/Output this shift: No intake/output data recorded.  General appearance: alert, cooperative and tired this afternoon from busy morning Heart: regular rate and rhythm Lungs: fair air exchange throughout Abdomen: benign Extremities: no edema Wound: incis healing well  Lab Results: Recent Labs    09/27/20 0545  WBC 9.2  HGB 8.2*  HCT 26.8*  PLT 444*   BMET:  Recent Labs    09/26/20 0847 09/27/20 0445  NA 137 138  K 4.0 4.6  CL 99 99  CO2 27 27  GLUCOSE 128* 94  BUN 20 18  CREATININE 1.30* 1.22*  CALCIUM 9.0 9.1    PT/INR: No results for input(s): LABPROT, INR in the last 72 hours. ABG    Component Value Date/Time   PHART 7.478 (H) 09/15/2020 2136   HCO3 26.8 09/15/2020 2136   TCO2 28 09/15/2020 2136   ACIDBASEDEF 2.0 09/14/2020 0048   O2SAT 69.0 09/27/2020 0445   CBG (last 3)  Recent Labs    09/26/20 1945 09/26/20 2319 09/27/20 0406  GLUCAP 137* 81 92    Meds Scheduled Meds: . allopurinol  300 mg Oral Daily  . aspirin EC  325 mg Oral Daily   Or  .  aspirin  324 mg Per Tube Daily  . bisacodyl  10 mg Oral Daily   Or  . bisacodyl  10 mg Rectal Daily  . chlorhexidine gluconate (MEDLINE KIT)  15 mL Mouth Rinse BID  . Chlorhexidine Gluconate Cloth  6 each Topical Daily  . digoxin  0.125 mg Oral Daily  . docusate sodium  200 mg Oral Daily  . enoxaparin (LOVENOX) injection  30 mg Subcutaneous Q24H  . feeding supplement  237 mL Oral BID BM  . insulin aspart  0-24 Units Subcutaneous Q4H  . latanoprost  1 drop Both Eyes QHS  . levothyroxine  25 mcg Oral q AM  . magnesium oxide  400 mg Oral BID  . pantoprazole  40 mg Oral Daily  . simvastatin  40 mg Oral q1800  . sodium chloride flush  10-40 mL Intracatheter Q12H  . sodium chloride flush  3 mL Intravenous Q12H  . sodium chloride flush  3 mL Intravenous Q12H   Continuous Infusions: . sodium chloride Stopped (09/14/20 0800)  . sodium chloride    . sodium chloride 10 mL/hr at 09/27/20 0500  . lactated ringers Stopped (09/15/20 1905)   PRN Meds:.sodium chloride, dextrose, levalbuterol, lip balm, metoprolol tartrate, ondansetron (ZOFRAN) IV, oxyCODONE, sodium chloride flush, sodium chloride flush, traMADol  Xrays DG CHEST PORT 1 VIEW  Result Date: 09/26/2020 CLINICAL DATA:  Congestive heart failure. EXAM: PORTABLE CHEST 1 VIEW COMPARISON:  September 24, 2020. FINDINGS: Stable cardiomegaly. No pneumothorax is noted. Right lung is clear. Mild left basilar atelectasis or infiltrate is noted. Bony thorax is unremarkable. Sternotomy wires are noted. Left-sided PICC line is unchanged in position. IMPRESSION: Mild left basilar atelectasis or infiltrate is noted. Electronically Signed   By: Marijo Conception M.D.   On: 09/26/2020 10:33   MR CARDIAC MORPHOLOGY W WO CONTRAST  Result Date: 09/25/2020 CLINICAL DATA:  RV failure EXAM: CARDIAC MRI TECHNIQUE: The patient was scanned on a 1.5 Tesla GE magnet. A dedicated cardiac coil was used. Functional imaging was done using Fiesta sequences. 2,3, and 4  chamber views were done to assess for RWMA's. Modified Simpson's rule using a short axis stack was used to calculate an ejection fraction on a dedicated work Conservation officer, nature. The patient received 10 cc of Gadavist. 10 minutes inversion recovery sequences were used to assess for infiltration and scar tissue. CONTRAST:  Gadavist 10 cc FINDINGS: S/p sternotomy. There was artifact from the pacing wires. Small bilateral pleural effusions with associated atelectasis on the left. S/p ascending aorta and arch replacement with residual dissection noted in the descending thoracic aorta. Normal left ventricular size and wall thickness. Normal wall motion with EF 66%. D-shaped septum suggestive of RV pressure/volume overload. RV moderately dilated and moderately dysfunctional, EF 36%. Moderate left and right atrial enlargement. There was probably moderate tricuspid regurgitation. No mitral regurgitation. Tricuspid aortic valve. suspect mild aortic insufficiency with no significant stenosis. Flow images to quantify aortic insufficiency were degraded by artifact from pacing wires. Delayed enhancement images were difficult due to artifact, but I did not see any definitive late gadolinium enhancement (LGE). Measurements: Calculated LV EF 66% (volumes not accurate). Calculated RV EF 36% (volumes not accurate. IMPRESSION: 1. Technically difficult study with significant artifact from pacing wires. 2.  Normal LV size with EF 66%, normal wall motion. 3. Moderately dilated RV with EF 36%, mild-moderate systolic dysfunction. 4. Delayed enhancement images were difficult due to artifact, but no definite LGE was noted. Therefore, no definitive evidence for prior MI, myocarditis, or infiltrative disease. Dalton Mclean Electronically Signed   By: Loralie Champagne M.D.   On: 09/25/2020 18:14    Assessment/Plan: S/P Procedure(s) (LRB): TYPE 1 AORTIC DISSECTION WITH REPLACEMENT OF ASCENDING AORTA WITH 32MM HEMASHIELD GRAFT,  RESUSPENSION OF AORTIC VALVE AND HYPOTHERMIC  CIRCULATORY ARRST WITH CEREBRAL PREFUSION   (N/A)   1 afeb, VSS off milrinone, Co-ox is 69 2 sats good on 4 l HFNC- wean as able, to get CTA of chest to r/o ILD per AHF team 3 anemia is fairly stable 4 renal fxn is stable 5 AHF team maximizing meds for discharge 6 PT recs HH but she has no local family to stay with, will prob benefit from [facility] for further rehab so she can travel to [place]  LOS: 14 days    John Giovanni PA-C Pager [phone] 09/27/2020  rhc resulted noted Patient improved over past week, off milrinone x24 hours- reevaluate for inpatient rehab - if turned down by inpatient rehab  then consider consider SNF  I have seen and examined [facility] and agree with the above assessment  and plan.  [name] MD Beeper [phone] Office [phone] 09/27/2020 5:27 PM

## 2020-09-27 NOTE — Interval H&P Note (Signed)
History and Physical Interval Note:  09/27/2020 7:49 AM  Danielle Atkins  has presented today for surgery, with the diagnosis of heart failure.  The various methods of treatment have been discussed with the patient and family. After consideration of risks, benefits and other options for treatment, the patient has consented to  Procedure(s): RIGHT HEART CATH (N/A) as a surgical intervention.  The patient's history has been reviewed, patient examined, no change in status, stable for surgery.  I have reviewed the patient's chart and labs.  Questions were answered to the patient's satisfaction.     Danielle Atkins

## 2020-09-28 DIAGNOSIS — E785 Hyperlipidemia, unspecified: Secondary | ICD-10-CM | POA: Insufficient documentation

## 2020-09-28 DIAGNOSIS — I50811 Acute right heart failure: Secondary | ICD-10-CM | POA: Diagnosis not present

## 2020-09-28 DIAGNOSIS — E119 Type 2 diabetes mellitus without complications: Secondary | ICD-10-CM | POA: Insufficient documentation

## 2020-09-28 DIAGNOSIS — Z87891 Personal history of nicotine dependence: Secondary | ICD-10-CM | POA: Insufficient documentation

## 2020-09-28 DIAGNOSIS — N183 Chronic kidney disease, stage 3 unspecified: Secondary | ICD-10-CM | POA: Insufficient documentation

## 2020-09-28 DIAGNOSIS — I10 Essential (primary) hypertension: Secondary | ICD-10-CM | POA: Insufficient documentation

## 2020-09-28 DIAGNOSIS — I4510 Unspecified right bundle-branch block: Secondary | ICD-10-CM | POA: Insufficient documentation

## 2020-09-28 DIAGNOSIS — G459 Transient cerebral ischemic attack, unspecified: Secondary | ICD-10-CM | POA: Insufficient documentation

## 2020-09-28 DIAGNOSIS — K922 Gastrointestinal hemorrhage, unspecified: Secondary | ICD-10-CM | POA: Insufficient documentation

## 2020-09-28 LAB — BASIC METABOLIC PANEL
Anion gap: 12 (ref 5–15)
BUN: 18 mg/dL (ref 8–23)
CO2: 26 mmol/L (ref 22–32)
Calcium: 8.8 mg/dL — ABNORMAL LOW (ref 8.9–10.3)
Chloride: 100 mmol/L (ref 98–111)
Creatinine, Ser: 1.15 mg/dL — ABNORMAL HIGH (ref 0.44–1.00)
GFR, Estimated: 44 mL/min — ABNORMAL LOW (ref >60)
Glucose, Bld: 100 mg/dL — ABNORMAL HIGH (ref 70–99)
Potassium: 4.1 mmol/L (ref 3.5–5.1)
Sodium: 138 mmol/L (ref 135–145)

## 2020-09-28 LAB — GLUCOSE, CAPILLARY
Glucose-Capillary: 100 mg/dL — ABNORMAL HIGH (ref 70–99)
Glucose-Capillary: 110 mg/dL — ABNORMAL HIGH (ref 70–99)
Glucose-Capillary: 126 mg/dL — ABNORMAL HIGH (ref 70–99)
Glucose-Capillary: 151 mg/dL — ABNORMAL HIGH (ref 70–99)
Glucose-Capillary: 88 mg/dL (ref 70–99)
Glucose-Capillary: 96 mg/dL (ref 70–99)

## 2020-09-28 LAB — COOXEMETRY PANEL
Carboxyhemoglobin: 1.6 % — ABNORMAL HIGH (ref 0.5–1.5)
Methemoglobin: 0.7 % (ref 0.0–1.5)
O2 Saturation: 58.5 %
Total hemoglobin: 8.9 g/dL — ABNORMAL LOW (ref 12.0–16.0)

## 2020-09-28 MED ORDER — BISOPROLOL FUMARATE 5 MG PO TABS
2.5000 mg | ORAL_TABLET | Freq: Every day | ORAL | Status: DC
  Administered 2020-09-28 – 2020-10-05 (×8): 2.5 mg via ORAL
  Filled 2020-09-28 (×8): qty 0.5

## 2020-09-28 NOTE — Progress Notes (Signed)
Patient ID: Danielle Atkins, female   DOB: [birth year], 85 y.o.   MRN: [id number]     Advanced Heart Failure Rounding Note  PCP-Cardiologist: Pixie Casino, MD   Subjective:    S/P 09/13/20 Emergency repair of type 1 aortic dissection with replacement of ascending aorta with 32 mm Hemashield graft with resuspension of the aortic valve with circulatory arrest, hypothermic.  Now off milrinone, good cardiac output on RHC (see below).  Co-ox 59% today.  Remains in sinus tachy around 110 bpm. Still on 3L [place]. CVP 3-4.   No dyspnea or chest pain at rest.   CT chest high resolution: No ILD, bilateral lower lobe atelectasis, moderate-severe emphysema.   cMRI 10/12  IMPRESSION: 1. Technically difficult study with significant artifact from pacing wires. 2.  Normal LV size with EF 66%, normal wall motion. 3. Moderately dilated RV with EF 36%, mild-moderate systolic dysfunction. 4. Delayed enhancement images were difficult due to artifact, but no definite LGE was noted. Therefore, no definitive evidence for prior MI, myocarditis, or infiltrative disease.  RHC Procedural Findings: Hemodynamics (mmHg) RA mean 6 RV 44/5 PA 47/21, mean 29 PCWP mean 5 Oxygen saturations: PA 60% AO 93% Cardiac Output (Fick) 6.48  Cardiac Index (Fick) 3.61 PVR 3.7 WU Cardiac Output (Thermo) 5.67 Cardiac Index (Thermo) 3.16  PVR 4.23 WU  Objective:   Weight Range: 71.1 kg Body mass index is 26.49 kg/m.   Vital Signs:   Temp:  [97.4 F (36.3 C)-98.7 F (37.1 C)] 97.7 F (36.5 C) (10/15 0807) Pulse Rate:  [105-112] 110 (10/15 0807) Resp:  [14-25] 25 (10/15 0807) BP: (117-157)/(67-80) 122/77 (10/15 0807) SpO2:  [91 %-98 %] 91 % (10/15 0807) Weight:  [71.1 kg] 71.1 kg (10/15 0409) Last BM Date: 09/26/20  Weight change: Filed Weights   09/26/20 0500 09/27/20 0500 09/28/20 0409  Weight: 71.8 kg 71.6 kg 71.1 kg    Intake/Output:  No intake or output data in the 24 hours ending 09/28/20  0928    Physical Exam    General: NAD Neck: No JVD, no thyromegaly or thyroid nodule.  Lungs: Decreased BS at bases.  CV: Nondisplaced PMI.  Heart regular S1/S2, no S3/S4, 2/6 SEM RUSB.  No peripheral edema.   Abdomen: Soft, nontender, no hepatosplenomegaly, no distention.  Skin: Intact without lesions or rashes.  Neurologic: Alert and oriented x 3.  Psych: Normal affect. Extremities: No clubbing or cyanosis.  HEENT: Normal.    Telemetry   Sinus tachy 110s. Personally reviewed  Labs    CBC Recent Labs    09/27/20 0545 09/27/20 0545 09/27/20 0805 09/27/20 0806  WBC 9.2  --   --   --   HGB 8.2*   < > 9.2* 9.2*  HCT 26.8*   < > 27.0* 27.0*  MCV 75.7*  --   --   --   PLT 444*  --   --   --    < > = values in this interval not displayed.   Basic Metabolic Panel Recent Labs    09/27/20 0445 09/27/20 0805 09/27/20 0806 09/28/20 0320  NA 138   < > 139 138  K 4.6   < > 4.3 4.1  CL 99  --   --  100  CO2 27  --   --  26  GLUCOSE 94  --   --  100*  BUN 18  --   --  18  CREATININE 1.22*  --   --  1.15*  CALCIUM 9.1  --   --  8.8*   < > = values in this interval not displayed.   Liver Function Tests No results for input(s): AST, ALT, ALKPHOS, BILITOT, PROT, ALBUMIN in the last 72 hours. No results for input(s): LIPASE, AMYLASE in the last 72 hours. Cardiac Enzymes No results for input(s): CKTOTAL, CKMB, CKMBINDEX, TROPONINI in the last 72 hours.  BNP: BNP (last 3 results) Recent Labs    09/13/20 1129  BNP 388.1*    ProBNP (last 3 results) No results for input(s): PROBNP in the last 8760 hours.   D-Dimer No results for input(s): DDIMER in the last 72 hours. Hemoglobin A1C No results for input(s): HGBA1C in the last 72 hours. Fasting Lipid Panel No results for input(s): CHOL, HDL, LDLCALC, TRIG, CHOLHDL, LDLDIRECT in the last 72 hours. Thyroid Function Tests No results for input(s): TSH, T4TOTAL, T3FREE, THYROIDAB in the last 72 hours.  Invalid  input(s): FREET3  Other results:   Imaging    CT Chest High Resolution  Result Date: 09/27/2020 CLINICAL DATA:  Inpatient. Dyspnea. Hypoxemia. Type A aortic dissection with ascending aortic repair 09/13/2020. EXAM: CT CHEST WITHOUT CONTRAST TECHNIQUE: Multidetector CT imaging of the chest was performed following the standard protocol without intravenous contrast. High resolution imaging of the lungs, as well as inspiratory and expiratory imaging, was performed. COMPARISON:  09/13/2020 chest CT angiogram. Chest radiograph from one day prior. FINDINGS: Motion degraded scan, limiting assessment. Cardiovascular: Moderate cardiomegaly. Trace pericardial effusion/thickening, similar. Left anterior descending coronary atherosclerosis. Left PICC terminates at the cavoatrial junction. Interval ascending thoracic aortic graft repair. Luminally displaced atherosclerotic intimal calcifications throughout the aortic arch and descending thoracic aorta compatible with known type A dissection. No acute intramural hematoma in the thoracic aorta. Mildly dilated descending thoracic aorta up to 4.1 cm diameter, unchanged. Stable dilated main pulmonary artery (3.9 cm diameter). Mediastinum/Nodes: No discrete thyroid nodules. Unremarkable esophagus. Stable mildly enlarged 1.0 cm right axillary lymph node (series 5/image 41). No pathologically enlarged left axillary nodes. Right paratracheal adenopathy up to 1.6 cm (series 5/image 52), unchanged. No new pathologically enlarged mediastinal nodes. No discrete hilar adenopathy on these noncontrast images. Lungs/Pleura: No pneumothorax. Small dependent bilateral pleural effusions, new on the right and stable on the left. Moderate passive atelectasis in the dependent lower lobes bilaterally, worsened bilaterally. Moderate to severe centrilobular emphysema with mild diffuse bronchial wall thickening. No lung masses or significant pulmonary nodules. No significant lobular air trapping  or evidence of tracheobronchomalacia on the expiration sequence. No substantial regions of subpleural reticulation, ground-glass attenuation, traction bronchiectasis or architectural distortion on this limited scan. No frank honeycombing. Mild interlobular septal thickening throughout both lungs. Upper abdomen: No acute abnormality. Musculoskeletal: No aggressive appearing focal osseous lesions. Intact sternotomy wires. Moderate thoracic spondylosis. IMPRESSION: 1. Limited motion degraded scan. No compelling evidence of underlying interstitial lung disease. 2. Evidence of mild congestive heart failure with cardiomegaly, mild interlobular septal thickening throughout both lungs suggesting mild pulmonary edema and small dependent bilateral pleural effusions, new on the right and stable on the left. 3. Moderate passive atelectasis in the dependent lower lobes, worsened bilaterally. 4. Stable dilated main pulmonary artery, suggesting chronic pulmonary arterial hypertension. 5. Moderate to severe centrilobular emphysema with mild diffuse bronchial wall thickening, suggesting COPD. 6. One vessel coronary atherosclerosis. 7. Stable mild right axillary and mediastinal lymphadenopathy, nonspecific. Follow-up chest CT with IV contrast suggested in 3-6 months. 8. Known type A aortic dissection status post interval ascending aortic graft repair, not well evaluated  on this noncontrast CT study. 9. Aortic Atherosclerosis (ICD10-I70.0) and Emphysema (ICD10-J43.9). Electronically Signed   By: Ilona Sorrel M.D.   On: 09/27/2020 12:42     Medications:     Scheduled Medications: . allopurinol  300 mg Oral Daily  . aspirin EC  325 mg Oral Daily   Or  . aspirin  324 mg Per Tube Daily  . bisacodyl  10 mg Oral Daily   Or  . bisacodyl  10 mg Rectal Daily  . chlorhexidine gluconate (MEDLINE KIT)  15 mL Mouth Rinse BID  . Chlorhexidine Gluconate Cloth  6 each Topical Daily  . digoxin  0.125 mg Oral Daily  . docusate sodium   200 mg Oral Daily  . enoxaparin (LOVENOX) injection  30 mg Subcutaneous Q24H  . feeding supplement  237 mL Oral BID BM  . insulin aspart  0-24 Units Subcutaneous Q4H  . latanoprost  1 drop Both Eyes QHS  . levothyroxine  25 mcg Oral q AM  . magnesium oxide  400 mg Oral BID  . pantoprazole  40 mg Oral Daily  . simvastatin  40 mg Oral q1800  . sodium chloride flush  10-40 mL Intracatheter Q12H  . sodium chloride flush  3 mL Intravenous Q12H    Infusions: . sodium chloride Stopped (09/14/20 0800)  . lactated ringers Stopped (09/15/20 1905)    PRN Medications: dextrose, levalbuterol, lip balm, metoprolol tartrate, ondansetron (ZOFRAN) IV, oxyCODONE, sodium chloride flush, traMADol   Assessment/Plan   1. Acute Type I Aortic Dissection:  Preoperative TEE confirmed evidence of at least moderate to severe aortic insufficiency with the dissection appearing to start just at the commissure between the left and right coronary cusp. Aortic valve tricuspid.  S/p aortic valve repair and ascending aorta replacement 9/30 by Dr. Servando Snare.  Mild AI on followup echo.  2. RV Failure / Post-Cardiotomy Shock: Prior Echo 08/2010 showed normal LVEF 60-65%, G1DD. RV appeared normal on parasternal images, however on the apical 4-chamber views it appeared mildly dilated and moderately hypokinetic. Prior to recent acute illness, she had notice gradual change in functional capacity over the last 6-8 months. She is an avid walker and noticed increased dyspnea on exertion and inability to finish her exercise walks. Becoming progressively fatigue but no associated CP.  Intra-operative TEE this admit demonstrated normal LVEF 50-55%. RV moderately enlarged w/ mildly reduced systolic function, moderate TR. Repeat limited post-op echo showed worsening RV function, D-shaped septum, mod-severely dilated RV with moderately decreased systolic function, peak RV-RA gradient 43 mmHg.  RV EF 36% with moderately dilated RV on cMRI, no  evidence for inferior/RV infarction by LGE.  V/Q scan was negative for PE. Suspect she had some pre-existing RV dysfunction, ?from COPD/emphysema. She had difficulty weaning off milrinone but now off with excellent cardiac output on RHC 10/14.  Filling pressures were normal/low.  Today, co-ox 59% with CVP 3-4.  - Continue digoxin 0.125 for RV support, check level in am.  - Continue to hold diuretics for low CVP.  - Can try low dose bisoprolol 2.5 mg daily for persistent tachycardia.  3. AKI on Stage III CKD: Resolved, creatinine down to 1.15. 4. Pericardial Effusion: Moderate effusion noted on intraoperative TEE, drained in OR.  Repeat limited echo 10/6 showed no effusion  - no change  5. Anemia: Expected ABLA from surgery  - management per CT surgery  6. Tricuspid Regurgitation: Moderate TR, likely functional  - management of RV failure as above 7. Hypoxemia: She remains on  oxygen despite good filling pressures on RHC and low CVP this morning. High resolution CT chest showed bilateral lower lobe atelectasis as wall as moderate-severe COPD.  I suspect that this combination is leading to her persistent hypoxemia.  - Continue incentive spirometry.  - She may need home oxygen.  8. Pulmonary hypertension: Mild pulmonary arterial hypertension on RHC.  Suspect group 3 PH due to ?OSA and COPD.  - Will need sleep study as outpatient.   Making improvement, continue to work with PT.   Length of Stay: [place], [place]  09/28/2020, 9:28 AM  Advanced Heart Failure Team Pager [phone] (M-F; 7a - 4p)  Please contact [facility] for night-coverage after hours (4p -7a ) and weekends on [email or web address]

## 2020-09-28 NOTE — Progress Notes (Addendum)
IP rehab admissions - Please see PT recommendations for Danielle Atkins therapies.  Patient did well ambulating 150' minguard assist.  Will not need an acute inpatient rehab admission.  Call me for questions.  [phone]  I will sign off for inpatient rehab at this time.  [phone]

## 2020-09-28 NOTE — Progress Notes (Signed)
CARDIAC REHAB PHASE I   PRE:  Rate/Rhythm: 120 ST    BP: sitting 106/74    SaO2: 93 3L  MODE:  Ambulation: 200 ft   POST:  Rate/Rhythm: 128 ST    BP: sitting 121/73     SaO2: 93 3L  Pt tired this am, up in recliner. Sts lack of sleep and busy day yesterday. She did not walk or do IS yesterday. Her HR has progressively increased over days as well as her general fatigue.   Able to stand with verbal cues for mechanics, min assist. Used RW, 3L. Slow walk, no real assist. Much more slow and tired today with decreased distance. Return to [facility] then recliner after long sit on BSC. Max HR 128 ST while walking. Had pt practice IS x3, which is very fatiguing for her, 700 mL max. Encouraged increased use today and also more walking.  [phone]    [place], ACSM 09/28/2020 10:18 AM

## 2020-09-28 NOTE — Progress Notes (Addendum)
[address]       [place] [address]             [phone]      1 Day Post-Op Procedure(s) (LRB): RIGHT HEART CATH (N/A) Subjective: Slept well, feels better   Objective: Vital signs in last 24 hours: Temp:  [97.4 F (36.3 C)-98.7 F (37.1 C)] 97.6 F (36.4 C) (10/15 0310) Pulse Rate:  [105-112] 112 (10/15 0310) Cardiac Rhythm: Sinus tachycardia (10/15 0310) Resp:  [14-27] 20 (10/15 0310) BP: (117-157)/(67-80) 117/72 (10/15 0310) SpO2:  [91 %-98 %] 93 % (10/15 0310) Weight:  [71.1 kg] 71.1 kg (10/15 0409)  Hemodynamic parameters for last 24 hours: CVP:  [5 mmHg-8 mmHg] 5 mmHg  Intake/Output from previous day: No intake/output data recorded. Intake/Output this shift: No intake/output data recorded.  General appearance: alert, cooperative and no distress Heart: regular rate and rhythm and tachy Lungs: dim in bases, fair air exchange throughout Abdomen: benign Extremities: no edema, PAS in place Wound: incis healing well  Lab Results: Recent Labs    09/27/20 0545 09/27/20 0545 09/27/20 0805 09/27/20 0806  WBC 9.2  --   --   --   HGB 8.2*   < > 9.2* 9.2*  HCT 26.8*   < > 27.0* 27.0*  PLT 444*  --   --   --    < > = values in this interval not displayed.   BMET:  Recent Labs    09/27/20 0445 09/27/20 0805 09/27/20 0806 09/28/20 0320  NA 138   < > 139 138  K 4.6   < > 4.3 4.1  CL 99  --   --  100  CO2 27  --   --  26  GLUCOSE 94  --   --  100*  BUN 18  --   --  18  CREATININE 1.22*  --   --  1.15*  CALCIUM 9.1  --   --  8.8*   < > = values in this interval not displayed.    PT/INR: No results for input(s): LABPROT, INR in the last 72 hours. ABG    Component Value Date/Time   PHART 7.478 (H) 09/15/2020 2136   HCO3 31.4 (H) 09/27/2020 0806   TCO2 33 (H) 09/27/2020 0806   ACIDBASEDEF 2.0 09/14/2020 0048   O2SAT 58.5 09/28/2020 0320   CBG (last 3)  Recent Labs    09/27/20 1957 09/27/20 2305 09/28/20 0309  GLUCAP  115* 94 100*    Meds Scheduled Meds: . allopurinol  300 mg Oral Daily  . aspirin EC  325 mg Oral Daily   Or  . aspirin  324 mg Per Tube Daily  . bisacodyl  10 mg Oral Daily   Or  . bisacodyl  10 mg Rectal Daily  . chlorhexidine gluconate (MEDLINE KIT)  15 mL Mouth Rinse BID  . Chlorhexidine Gluconate Cloth  6 each Topical Daily  . digoxin  0.125 mg Oral Daily  . docusate sodium  200 mg Oral Daily  . enoxaparin (LOVENOX) injection  30 mg Subcutaneous Q24H  . feeding supplement  237 mL Oral BID BM  . insulin aspart  0-24 Units Subcutaneous Q4H  . latanoprost  1 drop Both Eyes QHS  . levothyroxine  25 mcg Oral q AM  . magnesium oxide  400 mg Oral BID  . pantoprazole  40 mg Oral Daily  . simvastatin  40 mg Oral q1800  . sodium chloride  flush  10-40 mL Intracatheter Q12H  . sodium chloride flush  3 mL Intravenous Q12H   Continuous Infusions: . sodium chloride Stopped (09/14/20 0800)  . lactated ringers Stopped (09/15/20 1905)   PRN Meds:.dextrose, levalbuterol, lip balm, metoprolol tartrate, ondansetron (ZOFRAN) IV, oxyCODONE, sodium chloride flush, traMADol  Xrays CARDIAC CATHETERIZATION  Result Date: 09/27/2020 1. Normal right and left heart filling pressures. 2. Mild-moderate pulmonary arterial hypertension. 3. Preserved cardiac output.   CT Chest High Resolution  Result Date: 09/27/2020 CLINICAL DATA:  Inpatient. Dyspnea. Hypoxemia. Type A aortic dissection with ascending aortic repair 09/13/2020. EXAM: CT CHEST WITHOUT CONTRAST TECHNIQUE: Multidetector CT imaging of the chest was performed following the standard protocol without intravenous contrast. High resolution imaging of the lungs, as well as inspiratory and expiratory imaging, was performed. COMPARISON:  09/13/2020 chest CT angiogram. Chest radiograph from one day prior. FINDINGS: Motion degraded scan, limiting assessment. Cardiovascular: Moderate cardiomegaly. Trace pericardial effusion/thickening, similar. Left  anterior descending coronary atherosclerosis. Left PICC terminates at the cavoatrial junction. Interval ascending thoracic aortic graft repair. Luminally displaced atherosclerotic intimal calcifications throughout the aortic arch and descending thoracic aorta compatible with known type A dissection. No acute intramural hematoma in the thoracic aorta. Mildly dilated descending thoracic aorta up to 4.1 cm diameter, unchanged. Stable dilated main pulmonary artery (3.9 cm diameter). Mediastinum/Nodes: No discrete thyroid nodules. Unremarkable esophagus. Stable mildly enlarged 1.0 cm right axillary lymph node (series 5/image 41). No pathologically enlarged left axillary nodes. Right paratracheal adenopathy up to 1.6 cm (series 5/image 52), unchanged. No new pathologically enlarged mediastinal nodes. No discrete hilar adenopathy on these noncontrast images. Lungs/Pleura: No pneumothorax. Small dependent bilateral pleural effusions, new on the right and stable on the left. Moderate passive atelectasis in the dependent lower lobes bilaterally, worsened bilaterally. Moderate to severe centrilobular emphysema with mild diffuse bronchial wall thickening. No lung masses or significant pulmonary nodules. No significant lobular air trapping or evidence of tracheobronchomalacia on the expiration sequence. No substantial regions of subpleural reticulation, ground-glass attenuation, traction bronchiectasis or architectural distortion on this limited scan. No frank honeycombing. Mild interlobular septal thickening throughout both lungs. Upper abdomen: No acute abnormality. Musculoskeletal: No aggressive appearing focal osseous lesions. Intact sternotomy wires. Moderate thoracic spondylosis. IMPRESSION: 1. Limited motion degraded scan. No compelling evidence of underlying interstitial lung disease. 2. Evidence of mild congestive heart failure with cardiomegaly, mild interlobular septal thickening throughout both lungs suggesting mild  pulmonary edema and small dependent bilateral pleural effusions, new on the right and stable on the left. 3. Moderate passive atelectasis in the dependent lower lobes, worsened bilaterally. 4. Stable dilated main pulmonary artery, suggesting chronic pulmonary arterial hypertension. 5. Moderate to severe centrilobular emphysema with mild diffuse bronchial wall thickening, suggesting COPD. 6. One vessel coronary atherosclerosis. 7. Stable mild right axillary and mediastinal lymphadenopathy, nonspecific. Follow-up chest CT with IV contrast suggested in 3-6 months. 8. Known type A aortic dissection status post interval ascending aortic graft repair, not well evaluated on this noncontrast CT study. 9. Aortic Atherosclerosis (ICD10-I70.0) and Emphysema (ICD10-J43.9). Electronically Signed   By: Ilona Sorrel M.D.   On: 09/27/2020 12:42   DG CHEST PORT 1 VIEW  Result Date: 09/26/2020 CLINICAL DATA:  Congestive heart failure. EXAM: PORTABLE CHEST 1 VIEW COMPARISON:  September 24, 2020. FINDINGS: Stable cardiomegaly. No pneumothorax is noted. Right lung is clear. Mild left basilar atelectasis or infiltrate is noted. Bony thorax is unremarkable. Sternotomy wires are noted. Left-sided PICC line is unchanged in position. IMPRESSION: Mild left basilar atelectasis or infiltrate  is noted. Electronically Signed   By: Marijo Conception M.D.   On: 09/26/2020 10:33    Assessment/Plan: S/P Procedure(s) (LRB): RIGHT HEART CATH (N/A)   1 afeb, VSS some HTN readings with SBP upto 150's, sinus tachy- conts med management as per AHF team, Co-ox 58 with trend lower 2 sats ok on 3 liters- CT of chest reviewed, some findings of CHF and COPd, pulm HTN - full report above 3 renal fxn conts to improve, weight is stable 4 discharge planning in place but no local family who can stay with her, family hopeful to get to [facility] when able to travel that kind of distance safely  LOS: 15 days    John Giovanni PA-C Pager [phone] 09/28/2020  Patient would ideally benefit from in patient rehab now that renal  function improved and off milrinone for 48 hours with  normal right and left filling pressure by right heart cath -  And return to independent living here.  If turned down by inpatient rehab team then local SNF with PT and then transition to home care in [place] (3 hour drive) with son and daughter in law   IP rehab consult orders placed and consult to care management placed   I have seen and examined [name] and agree with the above assessment  and plan.  [name] MD Beeper [phone] Office [phone] 09/28/2020 8:17 AM

## 2020-09-28 NOTE — Plan of Care (Signed)
  Problem: Education: Goal: Knowledge of General Education information will improve Description: Including pain rating scale, medication(s)/side effects and non-pharmacologic comfort measures Outcome: Progressing   Problem: Health Behavior/Discharge Planning: Goal: Ability to manage health-related needs will improve Outcome: Progressing   Problem: Clinical Measurements: Goal: Ability to maintain clinical measurements within normal limits will improve Outcome: Progressing   Problem: Clinical Measurements: Goal: Diagnostic test results will improve Outcome: Progressing   Problem: Clinical Measurements: Goal: Cardiovascular complication will be avoided Outcome: Progressing   Problem: Activity: Goal: Risk for activity intolerance will decrease Outcome: Progressing   Problem: Nutrition: Goal: Adequate nutrition will be maintained Outcome: Progressing   Problem: Elimination: Goal: Will not experience complications related to bowel motility Outcome: Progressing   Problem: Pain Managment: Goal: General experience of comfort will improve Outcome: Progressing   Problem: Safety: Goal: Ability to remain free from injury will improve Outcome: Progressing   Problem: Skin Integrity: Goal: Risk for impaired skin integrity will decrease Outcome: Progressing   Problem: Education: Goal: Understanding of CV disease, CV risk reduction, and recovery process will improve Outcome: Progressing   Problem: Health Behavior/Discharge Planning: Goal: Ability to safely manage health-related needs after discharge will improve Outcome: Progressing

## 2020-09-28 NOTE — Progress Notes (Signed)
Physical Therapy Treatment Patient Details Name: Danielle Atkins MRN: [id number] DOB: 1935/07/23 Today's Date: 09/28/2020    History of Present Illness 85 yo admitted with chest pain 9/27 with aortic dissection s/p thoracic aortic aneurysm repair on 9/30 who self extubated 10/2 with progressive respiratory failure requiring bipap. PMhx:HTN, HLD, upper GIB, DM, gout    PT Comments    Pt with steady progress. Continue to recommend home with HHPT.    Follow Up Recommendations  Home health PT     Equipment Recommendations  Rolling walker with 5" wheels;3in1 (PT)    Recommendations for Other Services       Precautions / Restrictions Precautions Precautions: Sternal;Fall Precaution Booklet Issued: Yes (comment) Precaution Comments: watch sats    Mobility  Bed Mobility Overal bed mobility: Needs Assistance Bed Mobility: Sit to Supine       Sit to supine: Supervision   General bed mobility comments: supervision for lines  Transfers Overall transfer level: Needs assistance Equipment used: Rolling walker (2 wheeled) Transfers: Sit to/from Stand Sit to Stand: Min guard         General transfer comment: Assist for safety and verbal cues for hand placement  Ambulation/Gait Ambulation/Gait assistance: Min guard Gait Distance (Feet): 180 Feet Assistive device: Rolling walker (2 wheeled) Gait Pattern/deviations: Decreased stride length;Step-through pattern Gait velocity: decr Gait velocity interpretation: <1.31 ft/sec, indicative of household ambulator General Gait Details: Assist for safety and lines. Pt took 3 standing rest breaks   Stairs             Wheelchair Mobility    Modified Rankin (Stroke Patients Only)       Balance Overall balance assessment: Mild deficits observed, not formally tested                                          Cognition Arousal/Alertness: Awake/alert Behavior During Therapy: WFL for tasks  assessed/performed Overall Cognitive Status: Within Functional Limits for tasks assessed                                        Exercises      General Comments General comments (skin integrity, edema, etc.): Pt on 3L of O2 with SpO2 >90%      Pertinent Vitals/Pain      Home Living                      Prior Function            PT Goals (current goals can now be found in the care plan section) Acute Rehab PT Goals Patient Stated Goal: return to gambling at the casino, travel Potential to Achieve Goals: Good Progress towards PT goals: Progressing toward goals    Frequency    Min 3X/week      PT Plan Current plan remains appropriate    Co-evaluation              AM-PAC PT "6 Clicks" Mobility   Outcome Measure  Help needed turning from your back to your side while in a flat bed without using bedrails?: A Little Help needed moving from lying on your back to sitting on the side of a flat bed without using bedrails?: A Little Help needed moving to and from a bed to a  chair (including a wheelchair)?: A Little Help needed standing up from a chair using your arms (e.g., wheelchair or bedside chair)?: A Little Help needed to walk in hospital room?: A Little Help needed climbing 3-5 steps with a railing? : A Little 6 Click Score: 18    End of Session   Activity Tolerance: Patient tolerated treatment well Patient left: in bed;with call bell/phone within reach   PT Visit Diagnosis: Other abnormalities of gait and mobility (R26.89);Difficulty in walking, not elsewhere classified (R26.2)     Time: 2111-7356 PT Time Calculation (min) (ACUTE ONLY): 30 min  Charges:  $Gait Training: 23-37 mins                     [place] Pager [phone] Office [place] 09/28/2020, 3:42 PM

## 2020-09-29 DIAGNOSIS — I50810 Right heart failure, unspecified: Secondary | ICD-10-CM | POA: Diagnosis not present

## 2020-09-29 DIAGNOSIS — I7101 Dissection of thoracic aorta: Secondary | ICD-10-CM | POA: Diagnosis not present

## 2020-09-29 LAB — BASIC METABOLIC PANEL
Anion gap: 11 (ref 5–15)
BUN: 16 mg/dL (ref 8–23)
CO2: 24 mmol/L (ref 22–32)
Calcium: 8.3 mg/dL — ABNORMAL LOW (ref 8.9–10.3)
Chloride: 103 mmol/L (ref 98–111)
Creatinine, Ser: 0.99 mg/dL (ref 0.44–1.00)
GFR, Estimated: 53 mL/min — ABNORMAL LOW (ref >60)
Glucose, Bld: 86 mg/dL (ref 70–99)
Potassium: 3.8 mmol/L (ref 3.5–5.1)
Sodium: 138 mmol/L (ref 135–145)

## 2020-09-29 LAB — COOXEMETRY PANEL
Carboxyhemoglobin: 1.7 % — ABNORMAL HIGH (ref 0.5–1.5)
Methemoglobin: 0.7 % (ref 0.0–1.5)
O2 Saturation: 58 %
Total hemoglobin: 8.5 g/dL — ABNORMAL LOW (ref 12.0–16.0)

## 2020-09-29 LAB — GLUCOSE, CAPILLARY
Glucose-Capillary: 91 mg/dL (ref 70–99)
Glucose-Capillary: 88 mg/dL (ref 70–99)
Glucose-Capillary: 119 mg/dL — ABNORMAL HIGH (ref 70–99)
Glucose-Capillary: 103 mg/dL — ABNORMAL HIGH (ref 70–99)
Glucose-Capillary: 127 mg/dL — ABNORMAL HIGH (ref 70–99)

## 2020-09-29 LAB — DIGOXIN LEVEL: Digoxin Level: 0.7 ng/mL — ABNORMAL LOW (ref 1.0–2.0)

## 2020-09-29 NOTE — Progress Notes (Signed)
Patient ID: Danielle Atkins, female   DOB: Apr 30, 1935, 85 y.o.   MRN: [id number]     Advanced Heart Failure Rounding Note  PCP-Cardiologist: Pixie Casino, MD   Subjective:    S/P 09/13/20 Emergency repair of type 1 aortic dissection with replacement of ascending aorta with 32 mm Hemashield graft with resuspension of the aortic valve with circulatory arrest, hypothermic.  Now off milrinone, good cardiac output on RHC (see below).  Co-ox stable at 58% today.  Remains in sinus tachy around 105-110 bpm. Still on 3L [place]. CVP 5-7  Feels weak. Tired. But otherwise ok. No CP or SOB.    Studies:  CT 09/27/20  chest high resolution: No ILD, bilateral lower lobe atelectasis, moderate-severe emphysema.   cMRI 10/12  IMPRESSION: 1. Technically difficult study with significant artifact from pacing wires. 2.  Normal LV size with EF 66%, normal wall motion. 3. Moderately dilated RV with EF 36%, mild-moderate systolic dysfunction. 4. Delayed enhancement images were difficult due to artifact, but no definite LGE was noted. Therefore, no definitive evidence for prior MI, myocarditis, or infiltrative disease.  RHC Procedural Findings: Hemodynamics (mmHg) RA mean 6 RV 44/5 PA 47/21, mean 29 PCWP mean 5 Oxygen saturations: PA 60% AO 93% Cardiac Output (Fick) 6.48  Cardiac Index (Fick) 3.61 PVR 3.7 WU Cardiac Output (Thermo) 5.67 Cardiac Index (Thermo) 3.16  PVR 4.23 WU  Objective:   Weight Range: 70.7 kg Body mass index is 26.34 kg/m.   Vital Signs:   Temp:  [97.6 F (36.4 C)-98.2 F (36.8 C)] 98 F (36.7 C) (10/16 0738) Pulse Rate:  [98-110] 106 (10/16 1022) Resp:  [20-21] 21 (10/16 0738) BP: (102-121)/(67-85) 121/73 (10/16 0738) SpO2:  [91 %-94 %] 91 % (10/16 0738) Weight:  [70.7 kg] 70.7 kg (10/16 0341) Last BM Date: 09/28/20  Weight change: Filed Weights   09/27/20 0500 09/28/20 0409 09/29/20 0341  Weight: 71.6 kg 71.1 kg 70.7 kg     Intake/Output:   Intake/Output Summary (Last 24 hours) at 09/29/2020 1257 Last data filed at 09/29/2020 0342 Gross per 24 hour  Intake 240 ml  Output 301 ml  Net -61 ml      Physical Exam    General:  Weak appearing. No resp difficulty HEENT: normal Neck: supple. no JVD. Carotids 2+ bilat; no bruits. No lymphadenopathy or thryomegaly appreciated. Cor: Surgical wound ok. Regular tachy  2/6 TR Lungs: clear Abdomen: soft, nontender, nondistended. No hepatosplenomegaly. No bruits or masses. Good bowel sounds. Extremities: no cyanosis, clubbing, rash, edema Neuro: alert & orientedx3, cranial nerves grossly intact. moves all 4 extremities w/o difficulty. Affect pleasant    Telemetry   Sinus tachy 105-110 Personally reviewed   Labs    CBC Recent Labs    09/27/20 0545 09/27/20 0545 09/27/20 0805 09/27/20 0806  WBC 9.2  --   --   --   HGB 8.2*   < > 9.2* 9.2*  HCT 26.8*   < > 27.0* 27.0*  MCV 75.7*  --   --   --   PLT 444*  --   --   --    < > = values in this interval not displayed.   Basic Metabolic Panel Recent Labs    09/28/20 0320 09/29/20 0310  NA 138 138  K 4.1 3.8  CL 100 103  CO2 26 24  GLUCOSE 100* 86  BUN 18 16  CREATININE 1.15* 0.99  CALCIUM 8.8* 8.3*   Liver Function Tests No results for input(s):  AST, ALT, ALKPHOS, BILITOT, PROT, ALBUMIN in the last 72 hours. No results for input(s): LIPASE, AMYLASE in the last 72 hours. Cardiac Enzymes No results for input(s): CKTOTAL, CKMB, CKMBINDEX, TROPONINI in the last 72 hours.  BNP: BNP (last 3 results) Recent Labs    09/13/20 1129  BNP 388.1*    ProBNP (last 3 results) No results for input(s): PROBNP in the last 8760 hours.   D-Dimer No results for input(s): DDIMER in the last 72 hours. Hemoglobin A1C No results for input(s): HGBA1C in the last 72 hours. Fasting Lipid Panel No results for input(s): CHOL, HDL, LDLCALC, TRIG, CHOLHDL, LDLDIRECT in the last 72 hours. Thyroid Function  Tests No results for input(s): TSH, T4TOTAL, T3FREE, THYROIDAB in the last 72 hours.  Invalid input(s): FREET3  Other results:   Imaging    No results found.   Medications:     Scheduled Medications:  allopurinol  300 mg Oral Daily   aspirin EC  325 mg Oral Daily   Or   aspirin  324 mg Per Tube Daily   bisacodyl  10 mg Oral Daily   Or   bisacodyl  10 mg Rectal Daily   bisoprolol  2.5 mg Oral Daily   chlorhexidine gluconate (MEDLINE KIT)  15 mL Mouth Rinse BID   Chlorhexidine Gluconate Cloth  6 each Topical Daily   digoxin  0.125 mg Oral Daily   docusate sodium  200 mg Oral Daily   enoxaparin (LOVENOX) injection  30 mg Subcutaneous Q24H   feeding supplement  237 mL Oral BID BM   insulin aspart  0-24 Units Subcutaneous Q4H   latanoprost  1 drop Both Eyes QHS   levothyroxine  25 mcg Oral q AM   magnesium oxide  400 mg Oral BID   pantoprazole  40 mg Oral Daily   simvastatin  40 mg Oral q1800   sodium chloride flush  10-40 mL Intracatheter Q12H   sodium chloride flush  3 mL Intravenous Q12H    Infusions:  sodium chloride Stopped (09/14/20 0800)   lactated ringers Stopped (09/15/20 1905)    PRN Medications: dextrose, levalbuterol, lip balm, metoprolol tartrate, ondansetron (ZOFRAN) IV, oxyCODONE, sodium chloride flush, traMADol   Assessment/Plan   1. Acute Type I Aortic Dissection:  Preoperative TEE confirmed evidence of at least moderate to severe aortic insufficiency with the dissection appearing to start just at the commissure between the left and right coronary cusp. Aortic valve tricuspid.  S/p aortic valve repair and ascending aorta replacement 9/30 by Dr. Servando Snare.  Mild AI on followup echo.  - stable today. No change 2. RV Failure / Post-Cardiotomy Shock: Prior Echo 08/2010 showed normal LVEF 60-65%, G1DD. RV appeared normal on parasternal images, however on the apical 4-chamber views it appeared mildly dilated and moderately hypokinetic.  Prior to recent acute illness, she had notice gradual change in functional capacity over the last 6-8 months. She is an avid walker and noticed increased dyspnea on exertion and inability to finish her exercise walks. Becoming progressively fatigue but no associated CP.  Intra-operative TEE this admit demonstrated normal LVEF 50-55%. RV moderately enlarged w/ mildly reduced systolic function, moderate TR. Repeat limited post-op echo showed worsening RV function, D-shaped septum, mod-severely dilated RV with moderately decreased systolic function, peak RV-RA gradient 43 mmHg.  RV EF 36% with moderately dilated RV on cMRI, no evidence for inferior/RV infarction by LGE.  V/Q scan was negative for PE. Suspect she had some pre-existing RV dysfunction, ?from COPD/emphysema. She had  difficulty weaning off milrinone but now off with excellent cardiac output on RHC 10/14.  Filling pressures were normal/low.  Today, co-ox 58% with CVP 5-7.  - Continue digoxin 0.125 for RV support, dig level 0.7 today - Continue to hold diuretics for low CVP. Weight stable at 155 - On bisoprolol 2.5 mg daily for persistent tachycardia. Co-ox stable. HR still fast.  3. AKI on Stage III CKD: Resolved, creatinine down to 0.99 4. Pericardial Effusion: Moderate effusion noted on intraoperative TEE, drained in OR.  Repeat limited echo 10/6 showed no effusion  - no change  5. Anemia: Expected ABLA from surgery  - management per CT surgery  6. Tricuspid Regurgitation: Moderate TR, likely functional  - management of RV failure as above 7. Hypoxemia: She remains on oxygen despite good filling pressures on RHC and low CVP this morning. High resolution CT chest showed bilateral lower lobe atelectasis as wall as moderate-severe COPD.  I suspect that this combination is leading to her persistent hypoxemia.  - Continue incentive spirometry.  - She may need home oxygen.  8. Pulmonary hypertension: Mild pulmonary arterial hypertension on RHC.   Suspect group 3 PH due to ?OSA and COPD.  - Will need sleep study as outpatient.   Making slow improvement. Refused to walk with CR today due to fatigue. I encouraged her to participate. PT recommending HHPT. I am wondering whether short course of SNF would be better?    Length of Stay: [place], MD  09/29/2020, 12:57 PM  Advanced Heart Failure Team Pager [phone] (M-F; 7a - 4p)  Please contact [facility] for night-coverage after hours (4p -7a ) and weekends on [email or web address]

## 2020-09-29 NOTE — Progress Notes (Signed)
CARDIAC REHAB PHASE I  [phone] Attempted to ambulate with patient, but patient declined at this time stating that she is too tired to walk at this time. I emphasized the importance of ambulation and encouraged patient to let the nursing staff know when she is ready to walk. Patient is agreeable to this. Will continue to follow.  Sol Passer, MS, ACSM CEP

## 2020-09-29 NOTE — Progress Notes (Addendum)
      [address]       [place] [address]             [phone]      2 Days Post-Op Procedure(s) (LRB): RIGHT HEART CATH (N/A) Subjective: Feels good this morning.   Objective: Vital signs in last 24 hours: Temp:  [97.6 F (36.4 C)-98.2 F (36.8 C)] 98 F (36.7 C) (10/16 0738) Pulse Rate:  [98-110] 110 (10/16 0738) Cardiac Rhythm: Sinus tachycardia (10/16 0738) Resp:  [20-32] 21 (10/16 0738) BP: (102-131)/(67-85) 121/73 (10/16 0738) SpO2:  [91 %-94 %] 91 % (10/16 0738) Weight:  [70.7 kg] 70.7 kg (10/16 0341)  Hemodynamic parameters for last 24 hours: CVP:  [7 mmHg-11 mmHg] 7 mmHg  Intake/Output from previous day: 10/15 0701 - 10/16 0700 In: 600 [P.O.:600] Out: 301 [Urine:300; Stool:1] Intake/Output this shift: No intake/output data recorded.  General appearance: alert, cooperative and no distress Heart:sinus tachycardia Lungs: clear to auscultation bilaterally Abdomen: soft, non-tender; bowel sounds normal; no masses,  no organomegaly Extremities: extremities normal, atraumatic, no cyanosis or edema Wound: clean and dry  Lab Results: Recent Labs    09/27/20 0545 09/27/20 0545 09/27/20 0805 09/27/20 0806  WBC 9.2  --   --   --   HGB 8.2*   < > 9.2* 9.2*  HCT 26.8*   < > 27.0* 27.0*  PLT 444*  --   --   --    < > = values in this interval not displayed.   BMET:  Recent Labs    09/28/20 0320 09/29/20 0310  NA 138 138  K 4.1 3.8  CL 100 103  CO2 26 24  GLUCOSE 100* 86  BUN 18 16  CREATININE 1.15* 0.99  CALCIUM 8.8* 8.3*    PT/INR: No results for input(s): LABPROT, INR in the last 72 hours. ABG    Component Value Date/Time   PHART 7.478 (H) 09/15/2020 2136   HCO3 31.4 (H) 09/27/2020 0806   TCO2 33 (H) 09/27/2020 0806   ACIDBASEDEF 2.0 09/14/2020 0048   O2SAT 58.0 09/29/2020 0310   CBG (last 3)  Recent Labs    09/28/20 2344 09/29/20 0446 09/29/20 0744  GLUCAP 96 91 88    Assessment/Plan: S/P Procedure(s)  (LRB): RIGHT HEART CATH (N/A)  1. CV-sinus tachycardia. BP well controlled. Appreciate assistance from HF for titration of medication. Off milrinone x 72 hours. coox 58%. Continue asa and Statin.  2. Pulm-last CXR stable. Cont. xopenex treatments  3. Renal-creatinine 0.99, trending down. Weight trending down 4. Endo-well controlled blood glucose level   Plan: difficult disposition. Patient's family is in [place] where she eventually plans to stay. She will need a facility on discharge or stay in the hospital until she can travel which will be up to the surgeon.     LOS: 16 days    Elgie Collard 09/29/2020  Agree with above. Dispel planning.  Jadelyn Elks Bary Leriche

## 2020-09-30 DIAGNOSIS — I50813 Acute on chronic right heart failure: Secondary | ICD-10-CM

## 2020-09-30 DIAGNOSIS — I7100 Dissection of unspecified site of aorta: Secondary | ICD-10-CM | POA: Diagnosis not present

## 2020-09-30 LAB — GLUCOSE, CAPILLARY
Glucose-Capillary: 102 mg/dL — ABNORMAL HIGH (ref 70–99)
Glucose-Capillary: 102 mg/dL — ABNORMAL HIGH (ref 70–99)
Glucose-Capillary: 103 mg/dL — ABNORMAL HIGH (ref 70–99)
Glucose-Capillary: 119 mg/dL — ABNORMAL HIGH (ref 70–99)
Glucose-Capillary: 89 mg/dL (ref 70–99)

## 2020-09-30 LAB — BASIC METABOLIC PANEL
Anion gap: 10 (ref 5–15)
BUN: 16 mg/dL (ref 8–23)
CO2: 27 mmol/L (ref 22–32)
Calcium: 8.6 mg/dL — ABNORMAL LOW (ref 8.9–10.3)
Chloride: 101 mmol/L (ref 98–111)
Creatinine, Ser: 1.12 mg/dL — ABNORMAL HIGH (ref 0.44–1.00)
GFR, Estimated: 45 mL/min — ABNORMAL LOW (ref >60)
Glucose, Bld: 90 mg/dL (ref 70–99)
Potassium: 3.8 mmol/L (ref 3.5–5.1)
Sodium: 138 mmol/L (ref 135–145)

## 2020-09-30 LAB — COOXEMETRY PANEL
Carboxyhemoglobin: 1.9 % — ABNORMAL HIGH (ref 0.5–1.5)
Methemoglobin: 0.6 % (ref 0.0–1.5)
O2 Saturation: 69 %
Total hemoglobin: 8.4 g/dL — ABNORMAL LOW (ref 12.0–16.0)

## 2020-09-30 NOTE — Progress Notes (Addendum)
      [address]       [place] [address]             [phone]      3 Days Post-Op Procedure(s) (LRB): RIGHT HEART CATH (N/A) Subjective: Tired this morning since she has been up since 4am. She did not walk at all yesterday  Objective: Vital signs in last 24 hours: Temp:  [97.9 F (36.6 C)-98.1 F (36.7 C)] 98 F (36.7 C) (10/17 0716) Pulse Rate:  [99-106] 101 (10/17 0753) Cardiac Rhythm: Normal sinus rhythm (10/17 0756) Resp:  [16-25] 16 (10/17 0756) BP: (93-111)/(28-66) 111/65 (10/17 0716) SpO2:  [90 %-92 %] 90 % (10/17 0753) Weight:  [70.4 kg] 70.4 kg (10/17 0500)  Hemodynamic parameters for last 24 hours: CVP:  [4 mmHg-5 mmHg] 4 mmHg  Intake/Output from previous day: 10/16 0701 - 10/17 0700 In: -  Out: 450 [Urine:450] Intake/Output this shift: No intake/output data recorded.  General appearance: cooperative and no distress Heart: regular rate and rhythm, S1, S2 normal, no murmur, click, rub or gallop Lungs: clear to auscultation bilaterally Abdomen: soft, non-tender; bowel sounds normal; no masses,  no organomegaly Extremities: extremities normal, atraumatic, no cyanosis or edema Wound: clean and dry  Lab Results: No results for input(s): WBC, HGB, HCT, PLT in the last 72 hours. BMET: Recent Labs    09/29/20 0310 09/30/20 0441  NA 138 138  K 3.8 3.8  CL 103 101  CO2 24 27  GLUCOSE 86 90  BUN 16 16  CREATININE 0.99 1.12*  CALCIUM 8.3* 8.6*    PT/INR: No results for input(s): LABPROT, INR in the last 72 hours. ABG    Component Value Date/Time   PHART 7.478 (H) 09/15/2020 2136   HCO3 31.4 (H) 09/27/2020 0806   TCO2 33 (H) 09/27/2020 0806   ACIDBASEDEF 2.0 09/14/2020 0048   O2SAT 69.0 09/30/2020 0430   CBG (last 3)  Recent Labs    09/29/20 2009 09/30/20 0001 09/30/20 0437  GLUCAP 127* 102* 89    Assessment/Plan: S/P Procedure(s) (LRB): RIGHT HEART CATH (N/A)  1. CV-sinus tachycardia. BP well controlled. Appreciate  assistance from HF for titration of medication.  coox 69%. Continue asa and Statin.  2. Pulm-last CXR stable. Cont. xopenex treatments  3. Renal-creatinine 1.12, Weight trending down, excellent urine output 4. Endo-well controlled blood glucose level   Plan: Encouraged to ambulate today if she can. Nursing will try to cluster care.   Patient's family is in [place] where she eventually plans to stay. She will need a facility on discharge or stay in the hospital until she can travel which will be up to the surgeon.    LOS: 17 days    Elgie Collard 09/30/2020  Agree with above. [place]

## 2020-09-30 NOTE — TOC Progression Note (Addendum)
Transition of Care [facility]) - Progression Note    Patient Details  Name: Danielle Atkins MRN: [id number] Date of Birth: 1935/08/22  Transition of Care [facility]) CM/SW Contact  Claudie Leach, RN 09/30/2020, 4:51 PM  Clinical Narrative:    Spoke with patient and son at bedside.  Per MD note, patient unable to ambulate yesterday and needs SNF rehab before can travel to stay with son in [place].  Patient lives alone. Patient, son and MD agree that patient cannot go home alone at this point and SNF is only option.  Patient is agreeable.  Patient's son stated that he did not intend for patient to go home alone and it was his intention to bring her to [place], but he didn't know she was unable to travel. Son states patient is unable to stand up from a sitting position independently and only able to ambulate short distances.  He does not feel it is safe for her to go home alone.  We discussed that PT sessions have not demonstrated need for SNF but they could re-evaluate since patient and son feel like her total picture has changed.    Son requested SNF information.  Son given the Medicare.gov SNF list for [place] and surrounding areas.  He is aware that insurance and bed availability will play a part in placement.    CIR has declined as patient has ambulated 150-180 ft.    Barriers to Discharge: Continued Medical Work up  Expected Discharge Plan and Services                    DME Arranged: Gilford Rile rolling DME Agency: [employer] Date DME Agency Contacted: 09/26/20 Time DME Agency Contacted: [phone] Representative spoke with at DME Agency: [employer] rep [place]: PT [place]:  ([place]) Date [place]: 09/26/20 Time [place]: [place] Representative spoke with at [place]: Hoyle Sauer

## 2020-09-30 NOTE — Progress Notes (Signed)
Patient ID: Danielle Atkins, female   DOB: Apr 19, 1935, 85 y.o.   MRN: [id number]     Advanced Heart Failure Rounding Note  PCP-Cardiologist: Pixie Casino, MD   Subjective:    S/P 09/13/20 Emergency repair of type 1 aortic dissection with replacement of ascending aorta with 32 mm Hemashield graft with resuspension of the aortic valve with circulatory arrest, hypothermic.  Now off milrinone. Co-ox stable at 69% today. CVP 5. Feels tired. Has not walked. Denies SOB, orthopnea or PND. Brother at bedside.     Studies:  CT 09/27/20  chest high resolution: No ILD, bilateral lower lobe atelectasis, moderate-severe emphysema.   cMRI 10/12  IMPRESSION: 1. Technically difficult study with significant artifact from pacing wires. 2.  Normal LV size with EF 66%, normal wall motion. 3. Moderately dilated RV with EF 36%, mild-moderate systolic dysfunction. 4. Delayed enhancement images were difficult due to artifact, but no definite LGE was noted. Therefore, no definitive evidence for prior MI, myocarditis, or infiltrative disease.  RHC Procedural Findings: Hemodynamics (mmHg) RA mean 6 RV 44/5 PA 47/21, mean 29 PCWP mean 5 Oxygen saturations: PA 60% AO 93% Cardiac Output (Fick) 6.48  Cardiac Index (Fick) 3.61 PVR 3.7 WU Cardiac Output (Thermo) 5.67 Cardiac Index (Thermo) 3.16  PVR 4.23 WU  Objective:   Weight Range: 70.4 kg Body mass index is 26.23 kg/m.   Vital Signs:   Temp:  [97.9 F (36.6 C)-98.1 F (36.7 C)] 98 F (36.7 C) (10/17 0716) Pulse Rate:  [99-103] 101 (10/17 0753) Resp:  [16-25] 16 (10/17 0756) BP: (93-111)/(28-66) 111/65 (10/17 0716) SpO2:  [90 %-92 %] 90 % (10/17 0753) Weight:  [70.4 kg] 70.4 kg (10/17 0500) Last BM Date: 09/28/20  Weight change: Filed Weights   09/28/20 0409 09/29/20 0341 09/30/20 0500  Weight: 71.1 kg 70.7 kg 70.4 kg    Intake/Output:   Intake/Output Summary (Last 24 hours) at 09/30/2020 1413 Last data filed at  09/30/2020 0500 Gross per 24 hour  Intake --  Output 450 ml  Net -450 ml      Physical Exam    General:  Sitting in chair Weak appearing. No resp difficulty HEENT: normal Neck: supple. no JVD. Carotids 2+ bilat; no bruits. No lymphadenopathy or thryomegaly appreciated. Cor: PMI nondisplaced. Regular mildly tachy . 2/6 TR Lungs: clear with decreased BS throughout Abdomen: soft, nontender, nondistended. No hepatosplenomegaly. No bruits or masses. Good bowel sounds. Extremities: no cyanosis, clubbing, rash, edema Neuro: alert & orientedx3, cranial nerves grossly intact. moves all 4 extremities w/o difficulty. Affect pleasant   Telemetry   Sinus tachy 100-105 Personally reviewed  Labs    CBC No results for input(s): WBC, NEUTROABS, HGB, HCT, MCV, PLT in the last 72 hours. Basic Metabolic Panel Recent Labs    09/29/20 0310 09/30/20 0441  NA 138 138  K 3.8 3.8  CL 103 101  CO2 24 27  GLUCOSE 86 90  BUN 16 16  CREATININE 0.99 1.12*  CALCIUM 8.3* 8.6*   Liver Function Tests No results for input(s): AST, ALT, ALKPHOS, BILITOT, PROT, ALBUMIN in the last 72 hours. No results for input(s): LIPASE, AMYLASE in the last 72 hours. Cardiac Enzymes No results for input(s): CKTOTAL, CKMB, CKMBINDEX, TROPONINI in the last 72 hours.  BNP: BNP (last 3 results) Recent Labs    09/13/20 1129  BNP 388.1*    ProBNP (last 3 results) No results for input(s): PROBNP in the last 8760 hours.   D-Dimer No results for input(s): DDIMER  in the last 72 hours. Hemoglobin A1C No results for input(s): HGBA1C in the last 72 hours. Fasting Lipid Panel No results for input(s): CHOL, HDL, LDLCALC, TRIG, CHOLHDL, LDLDIRECT in the last 72 hours. Thyroid Function Tests No results for input(s): TSH, T4TOTAL, T3FREE, THYROIDAB in the last 72 hours.  Invalid input(s): FREET3  Other results:   Imaging    No results found.   Medications:     Scheduled Medications: . allopurinol  300  mg Oral Daily  . aspirin EC  325 mg Oral Daily   Or  . aspirin  324 mg Per Tube Daily  . bisacodyl  10 mg Oral Daily   Or  . bisacodyl  10 mg Rectal Daily  . bisoprolol  2.5 mg Oral Daily  . chlorhexidine gluconate (MEDLINE KIT)  15 mL Mouth Rinse BID  . Chlorhexidine Gluconate Cloth  6 each Topical Daily  . digoxin  0.125 mg Oral Daily  . docusate sodium  200 mg Oral Daily  . enoxaparin (LOVENOX) injection  30 mg Subcutaneous Q24H  . feeding supplement  237 mL Oral BID BM  . insulin aspart  0-24 Units Subcutaneous Q4H  . latanoprost  1 drop Both Eyes QHS  . levothyroxine  25 mcg Oral q AM  . magnesium oxide  400 mg Oral BID  . pantoprazole  40 mg Oral Daily  . simvastatin  40 mg Oral q1800  . sodium chloride flush  10-40 mL Intracatheter Q12H  . sodium chloride flush  3 mL Intravenous Q12H    Infusions: . sodium chloride Stopped (09/14/20 0800)  . lactated ringers Stopped (09/15/20 1905)    PRN Medications: dextrose, levalbuterol, lip balm, metoprolol tartrate, ondansetron (ZOFRAN) IV, oxyCODONE, sodium chloride flush, traMADol   Assessment/Plan   1. Acute Type I Aortic Dissection:  Preoperative TEE confirmed evidence of at least moderate to severe aortic insufficiency with the dissection appearing to start just at the commissure between the left and right coronary cusp. Aortic valve tricuspid.  S/p aortic valve repair and ascending aorta replacement 9/30 by Dr. Servando Snare.  Mild AI on followup echo.  - stable. No change 2. RV Failure / Post-Cardiotomy Shock: Prior Echo 08/2010 showed normal LVEF 60-65%, G1DD. RV appeared normal on parasternal images, however on the apical 4-chamber views it appeared mildly dilated and moderately hypokinetic. Prior to recent acute illness, she had notice gradual change in functional capacity over the last 6-8 months. She is an avid walker and noticed increased dyspnea on exertion and inability to finish her exercise walks. Becoming progressively  fatigue but no associated CP.  Intra-operative TEE this admit demonstrated normal LVEF 50-55%. RV moderately enlarged w/ mildly reduced systolic function, moderate TR. Repeat limited post-op echo showed worsening RV function, D-shaped septum, mod-severely dilated RV with moderately decreased systolic function, peak RV-RA gradient 43 mmHg.  RV EF 36% with moderately dilated RV on cMRI, no evidence for inferior/RV infarction by LGE.  V/Q scan was negative for PE. Suspect she had some pre-existing RV dysfunction, ?from COPD/emphysema. She had difficulty weaning off milrinone but now off with excellent cardiac output on RHC 10/14.  Filling pressures were normal/low.  Today, co-ox 69%. CVP 5  - Continue digoxin 0.125 for RV support, dig level 0.7  - Continue to hold diuretics for low CVP. Weight stable at 155 - On bisoprolol 2.5 mg daily for persistent tachycardia. Co-ox stable.  3. AKI on Stage III CKD: Resolved. Creatinine 1.12 today 4. Pericardial Effusion: Moderate effusion noted on intraoperative  TEE, drained in OR.  Repeat limited echo 10/6 showed no effusion  - no change  5. Anemia: Expected ABLA from surgery  - management per CT surgery  6. Tricuspid Regurgitation: Moderate TR, likely functional  - management of RV failure as above 7. Hypoxemia: She remains on oxygen despite good filling pressures on RHC and low CVP this morning. High resolution CT chest showed bilateral lower lobe atelectasis as wall as moderate-severe COPD.  I suspect that this combination is leading to her persistent hypoxemia.  - Continue incentive spirometry. I encouraged her again today - She may need home oxygen for a short period  8. Pulmonary hypertension: Mild pulmonary arterial hypertension on RHC.  Suspect group 3 PH due to ?OSA and COPD.  - Will need sleep study as outpatient.   Making slow improvement. Refused to walk with CR yesterday due to fatigue. I encouraged her to participate. PT recommending HHPT.   I  think she will need SNF.       Length of Stay: 17  Glori Bickers, MD  09/30/2020, 2:13 PM  Advanced Heart Failure Team Pager [phone] (M-F; [place])  Please contact [facility] for night-coverage after hours (4p -7a ) and weekends on [email or web address]

## 2020-10-01 ENCOUNTER — Ambulatory Visit: Payer: Medicare HMO | Admitting: Cardiology

## 2020-10-01 DIAGNOSIS — I50811 Acute right heart failure: Secondary | ICD-10-CM | POA: Diagnosis not present

## 2020-10-01 LAB — COOXEMETRY PANEL
Carboxyhemoglobin: 1.7 % — ABNORMAL HIGH (ref 0.5–1.5)
Methemoglobin: 0.7 % (ref 0.0–1.5)
O2 Saturation: 59 %
Total hemoglobin: 8.1 g/dL — ABNORMAL LOW (ref 12.0–16.0)

## 2020-10-01 LAB — BASIC METABOLIC PANEL
Anion gap: 10 (ref 5–15)
BUN: 15 mg/dL (ref 8–23)
CO2: 25 mmol/L (ref 22–32)
Calcium: 8.6 mg/dL — ABNORMAL LOW (ref 8.9–10.3)
Chloride: 102 mmol/L (ref 98–111)
Creatinine, Ser: 1.06 mg/dL — ABNORMAL HIGH (ref 0.44–1.00)
GFR, Estimated: 49 mL/min — ABNORMAL LOW (ref >60)
Glucose, Bld: 87 mg/dL (ref 70–99)
Potassium: 3.6 mmol/L (ref 3.5–5.1)
Sodium: 137 mmol/L (ref 135–145)

## 2020-10-01 LAB — GLUCOSE, CAPILLARY
Glucose-Capillary: 112 mg/dL — ABNORMAL HIGH (ref 70–99)
Glucose-Capillary: 80 mg/dL (ref 70–99)
Glucose-Capillary: 83 mg/dL (ref 70–99)
Glucose-Capillary: 90 mg/dL (ref 70–99)
Glucose-Capillary: 91 mg/dL (ref 70–99)
Glucose-Capillary: 97 mg/dL (ref 70–99)

## 2020-10-01 MED ORDER — FUROSEMIDE 40 MG PO TABS
40.0000 mg | ORAL_TABLET | Freq: Every day | ORAL | Status: DC
  Administered 2020-10-02 – 2020-10-05 (×4): 40 mg via ORAL
  Filled 2020-10-01 (×4): qty 1

## 2020-10-01 MED ORDER — POTASSIUM CHLORIDE CRYS ER 20 MEQ PO TBCR
20.0000 meq | EXTENDED_RELEASE_TABLET | Freq: Every day | ORAL | Status: DC
  Administered 2020-10-02 – 2020-10-05 (×4): 20 meq via ORAL
  Filled 2020-10-01 (×4): qty 1

## 2020-10-01 MED ORDER — GERHARDT'S BUTT CREAM
TOPICAL_CREAM | Freq: Three times a day (TID) | CUTANEOUS | Status: AC
  Administered 2020-10-01 – 2020-10-03 (×4): 1 via TOPICAL
  Filled 2020-10-01: qty 1

## 2020-10-01 NOTE — NC FL2 (Signed)
[place] MEDICAID FL2 LEVEL OF CARE SCREENING TOOL     IDENTIFICATION  Patient Name: Danielle Atkins Birthdate: May 07, 1935 Sex: female Admission Date (Current Location): 09/13/2020  [facility] and [place] Number:  Herbalist and Address:  [facility] [place] [address], [place], [place] [address]      Provider Number: [id number]  Attending Physician Name and Address:  [name], MD  Relative Name and Phone Number:  [place],JESSICA (Daughter) [phone]    Current Level of Care: Hospital Recommended Level of Care: [place] Prior Approval Number:    Date Approved/Denied:   PASRR Number: [id number]  Discharge Plan: SNF    Current Diagnoses: Patient Active Problem List   Diagnosis Date Noted   CKD (chronic kidney disease), stage III ([place])    Diabetes mellitus ([place])    Former tobacco use    HTN (hypertension)    Hyperlipidemia    RBBB    TIA (transient ischemic attack)    Upper GI bleed    Right heart failure ([place]) 09/20/2020   Aortic dissection ([place]) 09/14/2020   Aortic aneurysm ([place]) 09/13/2020   Piriformis syndrome, left 09/29/2019   CKD (chronic kidney disease) stage 3, GFR 30-59 ml/min ([place]) 09/01/2019   Diabetes mellitus with renal complications ([place]) 56/31/4970   GOUT 11/16/2009   PARESTHESIA, HANDS 03/27/2009   Pure hypercholesterolemia 06/25/2007   Microcytic anemia 06/25/2007   Essential hypertension 06/25/2007   PUD (peptic ulcer disease) 06/25/2007   Disorder resulting from impaired renal function 06/25/2007   History of cardiovascular disorder 06/25/2007   ANEMIA-NOS 06/25/2007    Orientation RESPIRATION BLADDER Height & Weight     Self, Time, Situation, Place  Normal Continent Weight: 155 lb 10.3 oz (70.6 kg) Height:  5' 4.5" (163.8 cm)  BEHAVIORAL SYMPTOMS/MOOD NEUROLOGICAL BOWEL NUTRITION STATUS      Continent Diet (see dc summary)  AMBULATORY STATUS  COMMUNICATION OF NEEDS Skin   Limited Assist Verbally Surgical wounds (Closed incision, right chest, also other, both 09/14/20)                       Personal Care Assistance Level of Assistance  Bathing, Feeding, Dressing Bathing Assistance: Limited assistance Feeding assistance: Independent Dressing Assistance: Limited assistance     Functional Limitations Info  Sight, Hearing, Speech Sight Info: Adequate Hearing Info: Adequate Speech Info: Adequate    SPECIAL CARE FACTORS FREQUENCY  PT (By licensed PT), OT (By licensed OT)     PT Frequency: 5x weekly OT Frequency: 5x weekly            Contractures Contractures Info: Not present    Additional Factors Info  Code Status, Allergies Code Status Info: Full Allergies Info: NKN           Current Medications (10/01/2020):  This is the current hospital active medication list Current Facility-Administered Medications  Medication Dose Route Frequency Provider Last Rate Last Admin   0.9 %  sodium chloride infusion  250 mL Intravenous Continuous Lars Pinks M, PA-C   Stopped at 09/14/20 0800   allopurinol (ZYLOPRIM) tablet 300 mg  300 mg Oral Daily Lars Pinks M, PA-C   300 mg at 10/01/20 2637   aspirin EC tablet 325 mg  325 mg Oral Daily Lars Pinks M, [place]   325 mg at 10/01/20 8588   Or   aspirin chewable tablet 324 mg  324 mg Per Tube Daily Nani Skillern, PA-C   324  mg at 09/28/20 1117   bisacodyl (DULCOLAX) EC tablet 10 mg  10 mg Oral Daily Lars Pinks M, PA-C   10 mg at 10/01/20 7902   Or   bisacodyl (DULCOLAX) suppository 10 mg  10 mg Rectal Daily Lars Pinks M, PA-C       bisoprolol (ZEBETA) tablet 2.5 mg  2.5 mg Oral Daily Larey Dresser, MD   2.5 mg at 10/01/20 4097   chlorhexidine gluconate (MEDLINE KIT) (PERIDEX) 0.12 % solution 15 mL  15 mL Mouth Rinse BID [name], MD   15 mL at 09/30/20 1959   Chlorhexidine Gluconate Cloth 2 % PADS 6 each   6 each Topical Daily [name], MD   6 each at 09/30/20 1825   dextrose 50 % solution 0-50 mL  0-50 mL Intravenous PRN Lars Pinks M, PA-C       digoxin Fonnie Birkenhead) tablet 0.125 mg  0.125 mg Oral Daily Larey Dresser, MD   0.125 mg at 10/01/20 0908   docusate sodium (COLACE) capsule 200 mg  200 mg Oral Daily Lars Pinks M, PA-C   200 mg at 10/01/20 3532   enoxaparin (LOVENOX) injection 30 mg  30 mg Subcutaneous Q24H Melrose Nakayama, MD   30 mg at 10/01/20 0908   feeding supplement (ENSURE ENLIVE) (ENSURE ENLIVE) liquid 237 mL  237 mL Oral BID BM [name], MD   237 mL at 09/30/20 1823   insulin aspart (novoLOG) injection 0-24 Units  0-24 Units Subcutaneous Q4H Melrose Nakayama, MD   2 Units at 09/29/20 2030   lactated ringers infusion   Intravenous Continuous Lars Pinks M, PA-C   Paused at 09/15/20 1905   latanoprost (XALATAN) 0.005 % ophthalmic solution 1 drop  1 drop Both Eyes QHS [name], MD   1 drop at 09/30/20 2136   levalbuterol (XOPENEX) nebulizer solution 0.63 mg  0.63 mg Nebulization Q6H PRN Melrose Nakayama, MD       levothyroxine (SYNTHROID) tablet 25 mcg  25 mcg Oral q AM Lars Pinks M, PA-C   25 mcg at 10/01/20 9924   lip balm (CARMEX) ointment   Topical PRN Melrose Nakayama, MD       magnesium oxide (MAG-OX) tablet 400 mg  400 mg Oral BID Gold, Wayne E, PA-C   400 mg at 10/01/20 0908   metoprolol tartrate (LOPRESSOR) injection 2.5-5 mg  2.5-5 mg Intravenous Q2H PRN Lars Pinks M, PA-C       ondansetron [facility]) injection 4 mg  4 mg Intravenous Q6H PRN Lars Pinks M, PA-C   4 mg at 09/19/20 1158   oxyCODONE (Oxy IR/ROXICODONE) immediate release tablet 5 mg  5 mg Oral Q6H PRN [name], MD   5 mg at 09/24/20 1132   pantoprazole (PROTONIX) EC tablet 40 mg  40 mg Oral Daily Lars Pinks M, PA-C   40 mg at 10/01/20 0908   simvastatin (ZOCOR) tablet 40 mg  40 mg  Oral q1800 Nani Skillern, PA-C   40 mg at 09/30/20 1827   sodium chloride flush (NS) 0.9 % injection 10-40 mL  10-40 mL Intracatheter Q12H [name], MD   10 mL at 09/30/20 2136   sodium chloride flush (NS) 0.9 % injection 3 mL  3 mL Intravenous Q12H Lars Pinks M, PA-C   3 mL at 09/29/20 2123   sodium chloride flush (NS) 0.9 % injection 3 mL  3 mL Intravenous PRN Lars Pinks  M, PA-C       traMADol (ULTRAM) tablet 50 mg  50 mg Oral Q6H PRN [name], MD   50 mg at 09/28/20 1943     Discharge Medications: Please see discharge summary for a list of discharge medications.  Relevant Imaging Results:  Relevant Lab Results:   Additional Information SSN [id number]  Loreta Ave, LCSWA

## 2020-10-01 NOTE — Plan of Care (Signed)
  Problem: Education: Goal: Knowledge of General Education information will improve Description: Including pain rating scale, medication(s)/side effects and non-pharmacologic comfort measures Outcome: Progressing   Problem: Clinical Measurements: Goal: Ability to maintain clinical measurements within normal limits will improve Outcome: Progressing Goal: Will remain free from infection Outcome: Progressing Goal: Diagnostic test results will improve Outcome: Progressing Goal: Respiratory complications will improve Outcome: Progressing Goal: Cardiovascular complication will be avoided Outcome: Progressing   Problem: Activity: Goal: Risk for activity intolerance will decrease Outcome: Progressing   Problem: Nutrition: Goal: Adequate nutrition will be maintained Outcome: Progressing   Problem: Coping: Goal: Level of anxiety will decrease Outcome: Progressing   Problem: Elimination: Goal: Will not experience complications related to bowel motility Outcome: Progressing Goal: Will not experience complications related to urinary retention Outcome: Progressing   Problem: Pain Managment: Goal: General experience of comfort will improve Outcome: Progressing   Problem: Safety: Goal: Ability to remain free from injury will improve Outcome: Progressing   Problem: Skin Integrity: Goal: Risk for impaired skin integrity will decrease Outcome: Progressing   Problem: Education: Goal: Understanding of CV disease, CV risk reduction, and recovery process will improve Outcome: Progressing Goal: Individualized Educational Video(s) Outcome: Progressing   Problem: Activity: Goal: Ability to return to baseline activity level will improve Outcome: Progressing   Problem: Cardiovascular: Goal: Ability to achieve and maintain adequate cardiovascular perfusion will improve Outcome: Progressing Goal: Vascular access site(s) Level 0-1 will be maintained Outcome: Progressing   Problem:  Health Behavior/Discharge Planning: Goal: Ability to safely manage health-related needs after discharge will improve Outcome: Progressing

## 2020-10-01 NOTE — Progress Notes (Addendum)
[address]       [place] [address]             [phone]    Subjective: Feeling better today, yesterday she felt better than Saturday when she was pretty tired  Objective: Vital signs in last 24 hours: Temp:  [97.4 F (36.3 C)-98.6 F (37 C)] 97.4 F (36.3 C) (10/18 0726) Pulse Rate:  [91-105] 98 (10/18 0726) Cardiac Rhythm: Normal sinus rhythm;Bundle branch block (10/18 0701) Resp:  [20-29] 29 (10/18 0726) BP: (92-120)/(61-73) 105/61 (10/18 0726) SpO2:  [93 %-97 %] 97 % (10/18 0726) Weight:  [70.6 kg] 70.6 kg (10/18 0347)  Hemodynamic parameters for last 24 hours: CVP:  [3 mmHg-5 mmHg] 3 mmHg  Intake/Output from previous day: 10/17 0701 - 10/18 0700 In: 240 [P.O.:240] Out: -  Intake/Output this shift: No intake/output data recorded.  General appearance: alert, cooperative and no distress Heart: regular rate and rhythm Lungs: clear to auscultation bilaterally Abdomen: benign Extremities: no edema Wound: incis healing well  Lab Results: No results for input(s): WBC, HGB, HCT, PLT in the last 72 hours. BMET: Recent Labs    09/30/20 0441 10/01/20 0345  NA 138 137  K 3.8 3.6  CL 101 102  CO2 27 25  GLUCOSE 90 87  BUN 16 15  CREATININE 1.12* 1.06*  CALCIUM 8.6* 8.6*    PT/INR: No results for input(s): LABPROT, INR in the last 72 hours. ABG    Component Value Date/Time   PHART 7.478 (H) 09/15/2020 2136   HCO3 31.4 (H) 09/27/2020 0806   TCO2 33 (H) 09/27/2020 0806   ACIDBASEDEF 2.0 09/14/2020 0048   O2SAT 59.0 10/01/2020 0345   CBG (last 3)  Recent Labs    10/01/20 0020 10/01/20 0441 10/01/20 0800  GLUCAP 91 80 83    Meds Scheduled Meds: . allopurinol  300 mg Oral Daily  . aspirin EC  325 mg Oral Daily   Or  . aspirin  324 mg Per Tube Daily  . bisacodyl  10 mg Oral Daily   Or  . bisacodyl  10 mg Rectal Daily  . bisoprolol  2.5 mg Oral Daily  . chlorhexidine gluconate (MEDLINE KIT)  15 mL Mouth Rinse BID  .  Chlorhexidine Gluconate Cloth  6 each Topical Daily  . digoxin  0.125 mg Oral Daily  . docusate sodium  200 mg Oral Daily  . enoxaparin (LOVENOX) injection  30 mg Subcutaneous Q24H  . feeding supplement  237 mL Oral BID BM  . insulin aspart  0-24 Units Subcutaneous Q4H  . latanoprost  1 drop Both Eyes QHS  . levothyroxine  25 mcg Oral q AM  . magnesium oxide  400 mg Oral BID  . pantoprazole  40 mg Oral Daily  . simvastatin  40 mg Oral q1800  . sodium chloride flush  10-40 mL Intracatheter Q12H  . sodium chloride flush  3 mL Intravenous Q12H   Continuous Infusions: . sodium chloride Stopped (09/14/20 0800)  . lactated ringers Stopped (09/15/20 1905)   PRN Meds:.dextrose, levalbuterol, lip balm, metoprolol tartrate, ondansetron (ZOFRAN) IV, oxyCODONE, sodium chloride flush, traMADol  Xrays No results found.  Assessment/Plan: S/P Procedure(s) (LRB): RIGHT HEART CATH (N/A)  1 remains very stable on current meds, Co-ox 59, AHF team managing  2 afeb, VSS, SBP in 90's at times 3 sats good on 5 liters- will prob need home O2 for at least short term 4 normal renal fxn 5 SNF placement?,  SW team assisting  LOS: 18 days    John Giovanni PA-C Pager [phone] 10/01/2020  Awaiting d/c plans  I have seen and examined [facility] and agree with the above assessment  and plan.  [name] MD [place] [phone] Office [phone] 10/01/2020 5:37 PM

## 2020-10-01 NOTE — Progress Notes (Signed)
Physical Therapy Treatment Patient Details Name: Danielle Atkins MRN: [id number] DOB: 12/31/1934 Today's Date: 10/01/2020    History of Present Illness 85 yo admitted with chest pain 9/27 with aortic dissection s/p thoracic aortic aneurysm repair on 9/30 who self extubated 10/2 with progressive respiratory failure requiring bipap. PMhx:HTN, HLD, upper GIB, DM, gout    PT Comments    Pt up in chair and agreeable to therapy. Pt reports she just feels like she "is being pressed hard". Pt has been encouraged by her physicians to increase her mobility and pt has tried harder to day sitting up in chair and walking into the bathroom. Pt continues to be limited in safe mobility by 4/4 DoE with longer distance ambulation, and increased O2 demand in presence of decreased strength and endurance. Pt is min guard for bed mobility, min guard for transfers and min A for ambulation with RW. Given pt's decreased activity tolerance, PT currently recommending SNF level rehab at discharge. Son in room during session and states family would prefer SNF in [place] so pt can be closer to her daughter. PT will continue to follow acutely.    Follow Up Recommendations  SNF     Equipment Recommendations  Rolling walker with 5" wheels;3in1 (PT)    Recommendations for Other Services OT consult     Precautions / Restrictions Precautions Precautions: Sternal;Fall Precaution Booklet Issued: Yes (comment) Precaution Comments: watch sats Restrictions Weight Bearing Restrictions: Yes Other Position/Activity Restrictions: sternal precautions    Mobility  Bed Mobility Overal bed mobility: Needs Assistance Bed Mobility: Sit to Supine       Sit to supine: Supervision   General bed mobility comments: supervision for lines, had pt help with LE to push up in the bed  Transfers Overall transfer level: Needs assistance Equipment used: Rolling walker (2 wheeled) Transfers: Sit to/from Stand Sit to Stand:  Min guard         General transfer comment: min guard for safety, able to stand from recliner and from lower toilet, assist for lines   Ambulation/Gait Ambulation/Gait assistance: Min guard Gait Distance (Feet): 40 Feet (3 x40' 1x20 ) Assistive device: Rolling walker (2 wheeled) Gait Pattern/deviations: Decreased stride length;Step-through pattern Gait velocity: decr   General Gait Details: min A for safety and lines, pt requiring 3 x standing rest break for 4/4 DoE and one time for SaO2 87%O2, vc for purse lip breathing to regain breath, SaO2       Balance Overall balance assessment: Mild deficits observed, not formally tested                                          Cognition Arousal/Alertness: Awake/alert Behavior During Therapy: WFL for tasks assessed/performed Overall Cognitive Status: Within Functional Limits for tasks assessed                                           General Comments General comments (skin integrity, edema, etc.): Pt on 5L O2 on entry with SaO2 100%O2, with ambulation placed on 6L O2 and SaO2 dipped to 88%O2 requiring standing rest break and purse lip breathing to recover, max noted HR 110bpm      Pertinent Vitals/Pain Pain Assessment: Faces Faces Pain Scale: Hurts a little bit Pain Location: back and hip  Pain Descriptors / Indicators: Grimacing;Guarding;Sore Pain Intervention(s): Limited activity within patient's tolerance;Monitored during session;Repositioned           PT Goals (current goals can now be found in the care plan section) Acute Rehab PT Goals Patient Stated Goal: return to gambling at the casino, travel PT Goal Formulation: With patient Time For Goal Achievement: 10/02/20 Potential to Achieve Goals: Good Progress towards PT goals: Progressing toward goals    Frequency    Min 3X/week      PT Plan Discharge plan needs to be updated       AM-PAC PT "6 Clicks" Mobility   Outcome  Measure  Help needed turning from your back to your side while in a flat bed without using bedrails?: A Little Help needed moving from lying on your back to sitting on the side of a flat bed without using bedrails?: A Little Help needed moving to and from a bed to a chair (including a wheelchair)?: A Little Help needed standing up from a chair using your arms (e.g., wheelchair or bedside chair)?: A Little Help needed to walk in hospital room?: A Little Help needed climbing 3-5 steps with a railing? : A Little 6 Click Score: 18    End of Session Equipment Utilized During Treatment: Gait belt Activity Tolerance: Patient tolerated treatment well Patient left: in bed;with call bell/phone within reach Nurse Communication: Mobility status PT Visit Diagnosis: Other abnormalities of gait and mobility (R26.89);Difficulty in walking, not elsewhere classified (R26.2)     Time: 7622-6333 PT Time Calculation (min) (ACUTE ONLY): 44 min  Charges:  $Gait Training: 8-22 mins $Therapeutic Exercise: 8-22 mins $Therapeutic Activity: 8-22 mins                     Lynetta Tomczak B. Migdalia Dk PT, DPT Acute Rehabilitation Services Pager [phone] Office [phone]    [place] 10/01/2020, 3:15 PM

## 2020-10-01 NOTE — Progress Notes (Signed)
1330  Will let PT see and evaluate whether SNF needed.  Have discussed with therapist. Will continue to follow. Graylon Good RN BSN 10/01/2020 1:28 PM

## 2020-10-01 NOTE — Progress Notes (Addendum)
Patient ID: Danielle Atkins, female   DOB: Jul 28, 1935, 85 y.o.   MRN: [id number]     Advanced Heart Failure Rounding Note  PCP-Cardiologist: Pixie Casino, MD   Subjective:    S/P 09/13/20 Emergency repair of type 1 aortic dissection with replacement of ascending aorta with 32 mm Hemashield graft with resuspension of the aortic valve with circulatory arrest, hypothermic.  Now off milrinone. Co-ox marginal at 59% today.  CVP 7-8.  Laying in bed. Feels tired. No significant dyspnea. Not eating much. Son at bedside.    Studies:  CT 09/27/20  chest high resolution: No ILD, bilateral lower lobe atelectasis, moderate-severe emphysema.   cMRI 10/12  IMPRESSION: 1. Technically difficult study with significant artifact from pacing wires. 2.  Normal LV size with EF 66%, normal wall motion. 3. Moderately dilated RV with EF 36%, mild-moderate systolic dysfunction. 4. Delayed enhancement images were difficult due to artifact, but no definite LGE was noted. Therefore, no definitive evidence for prior MI, myocarditis, or infiltrative disease.  RHC Procedural Findings: Hemodynamics (mmHg) RA mean 6 RV 44/5 PA 47/21, mean 29 PCWP mean 5 Oxygen saturations: PA 60% AO 93% Cardiac Output (Fick) 6.48  Cardiac Index (Fick) 3.61 PVR 3.7 WU Cardiac Output (Thermo) 5.67 Cardiac Index (Thermo) 3.16  PVR 4.23 WU  Objective:   Weight Range: 70.6 kg Body mass index is 26.3 kg/m.   Vital Signs:   Temp:  [97.4 F (36.3 C)-98.6 F (37 C)] 97.4 F (36.3 C) (10/18 0726) Pulse Rate:  [91-105] 98 (10/18 0726) Resp:  [20-29] 29 (10/18 0726) BP: (92-120)/(61-73) 105/61 (10/18 0726) SpO2:  [93 %-97 %] 97 % (10/18 0726) Weight:  [70.6 kg] 70.6 kg (10/18 0347) Last BM Date: 09/28/20  Weight change: Filed Weights   09/29/20 0341 09/30/20 0500 10/01/20 0347  Weight: 70.7 kg 70.4 kg 70.6 kg    Intake/Output:   Intake/Output Summary (Last 24 hours) at 10/01/2020 0842 Last data filed  at 09/30/2020 1700 Gross per 24 hour  Intake 240 ml  Output --  Net 240 ml      Physical Exam    CVP 7-8 General:  Fatigue appearing. No resp difficulty HEENT: normal Neck: supple.  JVD ~7 cm. Carotids 2+ bilat; no bruits. No lymphadenopathy or thryomegaly appreciated. Cor: PMI nondisplaced. RRR . 2/6 TR Lungs: decreased BS at the bases bilaterally, no wheezing  Abdomen: soft, nontender, nondistended. No hepatosplenomegaly. No bruits or masses. Good bowel sounds. Extremities: no cyanosis, clubbing, rash, edema Neuro: alert & orientedx3, cranial nerves grossly intact. moves all 4 extremities w/o difficulty. Affect pleasant   Telemetry   NSR 90s Personally reviewed  Labs    CBC No results for input(s): WBC, NEUTROABS, HGB, HCT, MCV, PLT in the last 72 hours. Basic Metabolic Panel Recent Labs    09/30/20 0441 10/01/20 0345  NA 138 137  K 3.8 3.6  CL 101 102  CO2 27 25  GLUCOSE 90 87  BUN 16 15  CREATININE 1.12* 1.06*  CALCIUM 8.6* 8.6*   Liver Function Tests No results for input(s): AST, ALT, ALKPHOS, BILITOT, PROT, ALBUMIN in the last 72 hours. No results for input(s): LIPASE, AMYLASE in the last 72 hours. Cardiac Enzymes No results for input(s): CKTOTAL, CKMB, CKMBINDEX, TROPONINI in the last 72 hours.  BNP: BNP (last 3 results) Recent Labs    09/13/20 1129  BNP 388.1*    ProBNP (last 3 results) No results for input(s): PROBNP in the last 8760 hours.   D-Dimer No  results for input(s): DDIMER in the last 72 hours. Hemoglobin A1C No results for input(s): HGBA1C in the last 72 hours. Fasting Lipid Panel No results for input(s): CHOL, HDL, LDLCALC, TRIG, CHOLHDL, LDLDIRECT in the last 72 hours. Thyroid Function Tests No results for input(s): TSH, T4TOTAL, T3FREE, THYROIDAB in the last 72 hours.  Invalid input(s): FREET3  Other results:   Imaging    No results found.   Medications:     Scheduled Medications: . allopurinol  300 mg Oral  Daily  . aspirin EC  325 mg Oral Daily   Or  . aspirin  324 mg Per Tube Daily  . bisacodyl  10 mg Oral Daily   Or  . bisacodyl  10 mg Rectal Daily  . bisoprolol  2.5 mg Oral Daily  . chlorhexidine gluconate (MEDLINE KIT)  15 mL Mouth Rinse BID  . Chlorhexidine Gluconate Cloth  6 each Topical Daily  . digoxin  0.125 mg Oral Daily  . docusate sodium  200 mg Oral Daily  . enoxaparin (LOVENOX) injection  30 mg Subcutaneous Q24H  . feeding supplement  237 mL Oral BID BM  . insulin aspart  0-24 Units Subcutaneous Q4H  . latanoprost  1 drop Both Eyes QHS  . levothyroxine  25 mcg Oral q AM  . magnesium oxide  400 mg Oral BID  . pantoprazole  40 mg Oral Daily  . simvastatin  40 mg Oral q1800  . sodium chloride flush  10-40 mL Intracatheter Q12H  . sodium chloride flush  3 mL Intravenous Q12H    Infusions: . sodium chloride Stopped (09/14/20 0800)  . lactated ringers Stopped (09/15/20 1905)    PRN Medications: dextrose, levalbuterol, lip balm, metoprolol tartrate, ondansetron (ZOFRAN) IV, oxyCODONE, sodium chloride flush, traMADol   Assessment/Plan   1. Acute Type I Aortic Dissection:  Preoperative TEE confirmed evidence of at least moderate to severe aortic insufficiency with the dissection appearing to start just at the commissure between the left and right coronary cusp. Aortic valve tricuspid.  S/p aortic valve repair and ascending aorta replacement 9/30 by Dr. Gerhardt.  Mild AI on followup echo.  - stable. No change 2. RV Failure / Post-Cardiotomy Shock: Prior Echo 08/2010 showed normal LVEF 60-65%, G1DD. RV appeared normal on parasternal images, however on the apical 4-chamber views it appeared mildly dilated and moderately hypokinetic. Prior to recent acute illness, she had notice gradual change in functional capacity over the last 6-8 months. She is an avid walker and noticed increased dyspnea on exertion and inability to finish her exercise walks. Becoming progressively fatigue  but no associated CP.  Intra-operative TEE this admit demonstrated normal LVEF 50-55%. RV moderately enlarged w/ mildly reduced systolic function, moderate TR. Repeat limited post-op echo showed worsening RV function, D-shaped septum, mod-severely dilated RV with moderately decreased systolic function, peak RV-RA gradient 43 mmHg.  RV EF 36% with moderately dilated RV on cMRI, no evidence for inferior/RV infarction by LGE.  V/Q scan was negative for PE. Suspect she had some pre-existing RV dysfunction, ?from COPD/emphysema. She had difficulty weaning off milrinone but now off with excellent cardiac output on RHC 10/14.  Filling pressures were normal/low.  Today, co-ox 59%. CVP 7 - Continue digoxin 0.125 for RV support, dig level 0.7  - Continue to hold diuretics for low CVP. Weight stable at 155 - On bisoprolol 2.5 mg daily for persistent tachycardia. Co-ox marginal.  3. AKI on Stage III CKD: Resolved. Creatinine 1.1 today 4. Pericardial Effusion: Moderate effusion   noted on intraoperative TEE, drained in OR.  Repeat limited echo 10/6 showed no effusion  - no change  5. Anemia: Expected ABLA from surgery  - management per CT surgery  6. Tricuspid Regurgitation: Moderate TR, likely functional  - management of RV failure as above 7. Hypoxemia: She remains on oxygen despite good filling pressures on RHC and low CVP this morning. High resolution CT chest showed bilateral lower lobe atelectasis as wall as moderate-severe COPD.  I suspect that this combination is leading to her persistent hypoxemia.  - Continue incentive spirometry.  - She may need home oxygen for a short period  8. Pulmonary hypertension: Mild pulmonary arterial hypertension on RHC.  Suspect group 3 PH due to ?OSA and COPD.  - Will need sleep study as outpatient.  9. Deconditioning.  - continue to ambulate w/ PT.  - may need SNF     Length of Stay: 18  Brittainy Simmons, PA-C  10/01/2020, 8:42 AM  Advanced Heart Failure  Team Pager [phone] (M-F; 7a - 4p)  Please contact [facility] for night-coverage after hours (4p -7a ) and weekends on [email or web address]  Patient seen with PA, agree with the above note.    Stable today, main complaint is back pain after coughing.  Has not been out of bed yet.  CVP 7-8 cm with co-ox 59%.   Will start her on Lasix po tomorrow, 40 mg daily and follow creatinine. Will give KCl 20 daily.   She is stable from my standpoint.  We will sign off, will arrange followup.   Dalton McLean 10/01/2020 1:26 PM  

## 2020-10-02 LAB — COOXEMETRY PANEL
Carboxyhemoglobin: 1.5 % (ref 0.5–1.5)
Methemoglobin: 0.8 % (ref 0.0–1.5)
O2 Saturation: 55.4 %
Total hemoglobin: 7.8 g/dL — ABNORMAL LOW (ref 12.0–16.0)

## 2020-10-02 LAB — BASIC METABOLIC PANEL
Anion gap: 8 (ref 5–15)
BUN: 15 mg/dL (ref 8–23)
CO2: 26 mmol/L (ref 22–32)
Calcium: 8.7 mg/dL — ABNORMAL LOW (ref 8.9–10.3)
Chloride: 102 mmol/L (ref 98–111)
Creatinine, Ser: 0.95 mg/dL (ref 0.44–1.00)
GFR, Estimated: 55 mL/min — ABNORMAL LOW (ref >60)
Glucose, Bld: 126 mg/dL — ABNORMAL HIGH (ref 70–99)
Potassium: 3.7 mmol/L (ref 3.5–5.1)
Sodium: 136 mmol/L (ref 135–145)

## 2020-10-02 LAB — GLUCOSE, CAPILLARY
Glucose-Capillary: 101 mg/dL — ABNORMAL HIGH (ref 70–99)
Glucose-Capillary: 101 mg/dL — ABNORMAL HIGH (ref 70–99)
Glucose-Capillary: 103 mg/dL — ABNORMAL HIGH (ref 70–99)
Glucose-Capillary: 108 mg/dL — ABNORMAL HIGH (ref 70–99)
Glucose-Capillary: 91 mg/dL (ref 70–99)
Glucose-Capillary: 92 mg/dL (ref 70–99)

## 2020-10-02 NOTE — Plan of Care (Signed)
  Problem: Education: Goal: Knowledge of General Education information will improve Description: Including pain rating scale, medication(s)/side effects and non-pharmacologic comfort measures Outcome: Progressing   Problem: Health Behavior/Discharge Planning: Goal: Ability to manage health-related needs will improve Outcome: Progressing   Problem: Clinical Measurements: Goal: Ability to maintain clinical measurements within normal limits will improve Outcome: Progressing Goal: Will remain free from infection Outcome: Progressing Goal: Diagnostic test results will improve Outcome: Progressing Goal: Respiratory complications will improve Outcome: Progressing Goal: Cardiovascular complication will be avoided Outcome: Progressing   Problem: Activity: Goal: Risk for activity intolerance will decrease Outcome: Progressing   Problem: Nutrition: Goal: Adequate nutrition will be maintained Outcome: Progressing   Problem: Coping: Goal: Level of anxiety will decrease Outcome: Progressing   Problem: Elimination: Goal: Will not experience complications related to bowel motility Outcome: Progressing Goal: Will not experience complications related to urinary retention Outcome: Progressing   Problem: Pain Managment: Goal: General experience of comfort will improve Outcome: Progressing   Problem: Safety: Goal: Ability to remain free from injury will improve Outcome: Progressing   Problem: Education: Goal: Understanding of CV disease, CV risk reduction, and recovery process will improve Outcome: Progressing Goal: Individualized Educational Video(s) Outcome: Progressing   Problem: Activity: Goal: Ability to return to baseline activity level will improve Outcome: Progressing   Problem: Cardiovascular: Goal: Ability to achieve and maintain adequate cardiovascular perfusion will improve Outcome: Progressing Goal: Vascular access site(s) Level 0-1 will be maintained Outcome:  Progressing

## 2020-10-02 NOTE — Progress Notes (Addendum)
[address]       [place] [address]             [phone]      5 Days Post-Op Procedure(s) (LRB): RIGHT HEART CATH (N/A) Subjective:  conts to make steady progress  Objective: Vital signs in last 24 hours: Temp:  [97.5 F (36.4 C)-98.7 F (37.1 C)] 98.7 F (37.1 C) (10/19 0732) Pulse Rate:  [87-99] 99 (10/19 0732) Cardiac Rhythm: Normal sinus rhythm (10/19 0701) Resp:  [19-28] 25 (10/19 0732) BP: (104-113)/(53-69) 106/64 (10/19 0732) SpO2:  [95 %-98 %] 98 % (10/19 0732) Weight:  [71 kg] 71 kg (10/19 0300)  Hemodynamic parameters for last 24 hours: CVP:  [2 mmHg-8 mmHg] 3 mmHg  Intake/Output from previous day: 10/18 0701 - 10/19 0700 In: 240 [P.O.:240] Out: 150 [Urine:150] Intake/Output this shift: No intake/output data recorded.  Neurologic: intact Heart: regular rate and rhythm Lungs: clear to auscultation bilaterally Abdomen: benign Extremities: no edema Wound: incis healing well  Lab Results: No results for input(s): WBC, HGB, HCT, PLT in the last 72 hours. BMET: Recent Labs    09/30/20 0441 10/01/20 0345  NA 138 137  K 3.8 3.6  CL 101 102  CO2 27 25  GLUCOSE 90 87  BUN 16 15  CREATININE 1.12* 1.06*  CALCIUM 8.6* 8.6*    PT/INR: No results for input(s): LABPROT, INR in the last 72 hours. ABG    Component Value Date/Time   PHART 7.478 (H) 09/15/2020 2136   HCO3 31.4 (H) 09/27/2020 0806   TCO2 33 (H) 09/27/2020 0806   ACIDBASEDEF 2.0 09/14/2020 0048   O2SAT 55.4 10/02/2020 0412   CBG (last 3)  Recent Labs    10/02/20 0017 10/02/20 0439 10/02/20 0806  GLUCAP 101* 91 92    Meds Scheduled Meds: . allopurinol  300 mg Oral Daily  . aspirin EC  325 mg Oral Daily   Or  . aspirin  324 mg Per Tube Daily  . bisacodyl  10 mg Oral Daily   Or  . bisacodyl  10 mg Rectal Daily  . bisoprolol  2.5 mg Oral Daily  . chlorhexidine gluconate (MEDLINE KIT)  15 mL Mouth Rinse BID  . Chlorhexidine Gluconate Cloth  6 each  Topical Daily  . digoxin  0.125 mg Oral Daily  . docusate sodium  200 mg Oral Daily  . enoxaparin (LOVENOX) injection  30 mg Subcutaneous Q24H  . feeding supplement  237 mL Oral BID BM  . furosemide  40 mg Oral Daily  . Natavia Sublette's butt cream   Topical TID  . insulin aspart  0-24 Units Subcutaneous Q4H  . latanoprost  1 drop Both Eyes QHS  . levothyroxine  25 mcg Oral q AM  . magnesium oxide  400 mg Oral BID  . pantoprazole  40 mg Oral Daily  . potassium chloride  20 mEq Oral Daily  . simvastatin  40 mg Oral q1800  . sodium chloride flush  10-40 mL Intracatheter Q12H  . sodium chloride flush  3 mL Intravenous Q12H   Continuous Infusions: . sodium chloride Stopped (09/14/20 0800)  . lactated ringers Stopped (09/15/20 1905)   PRN Meds:.dextrose, levalbuterol, lip balm, metoprolol tartrate, ondansetron (ZOFRAN) IV, oxyCODONE, sodium chloride flush, traMADol  Xrays No results found.  Assessment/Plan: S/P Procedure(s) (LRB): RIGHT HEART CATH (N/A)  1 VSS, afeb 2 sats good on 5 liters HFNC 3 sinus rhythm 4 Co-Ox 55, AHF team managing CHF meds 5  BS well controlled 6 working on SNF arrangements , getting OT consult, getting close to d/c when O2 requirements less and CHF is felt to be stable enough on current meds  LOS: 19 days    John Giovanni PA-C Pager [phone] 10/02/2020  waiting for snf placement  I have seen and examined [facility] and agree with the above assessment  and plan.  [name] MD Beeper [phone] Office [phone] 10/02/2020 2:35 PM

## 2020-10-02 NOTE — TOC Initial Note (Signed)
Transition of Care [facility]) - Initial/Assessment Note    Patient Details  Name: Danielle Atkins MRN: [id number] Date of Birth: [birth year]  [facility]) CM/SW Contact:    Danielle Atkins, [place] Phone Number: 10/02/2020, 5:01 PM  Clinical Narrative:                 CSW received consult for possible SNF placement at time of discharge. CSW spoke with patient and her daughter Danielle Atkins at bedside regarding PT recommendation of SNF placement at time of discharge. Patient and her family expressed understanding of PT recommendation and is agreeable to SNF placement at time of discharge. Patient reports preference for [facility] in [place]. CSW later explained that [facility] is actually an inpatient rehab inside the hospital in [place]. Danielle Atkins then requested the original offers discussed and their medicare.gov ratings, CSW provided the ratings list. CSW discussed insurance authorization process. Patient has received the COVID vaccines. Patient expressed being hopeful for rehab and to feel better soon. Patient's plan is to move to [place] with her son once she feels stronger. No further questions reported at this time. CSW to continue to follow and assist with discharge planning needs.    Barriers to Discharge: Continued Medical Work up   Patient Goals and CMS Choice Patient states their goals for this hospitalization and ongoing recovery are:: to get better CMS Medicare.gov Compare Post Acute Care list provided to:: Patient Choice offered to / list presented to : Patient  Expected Discharge Plan and Services                           DME Arranged: Walker rolling DME Agency: [employer] Date DME Agency Contacted: 09/26/20 Time DME Agency Contacted: [phone] Representative spoke with at DME Agency: [employer] rep [place]: PT HH Agency:  ([place]) Date [place]: 09/26/20 Time [place]: 1442 Representative spoke with at [place]: [place]  Arrangements/Services                       Activities of Daily Living Home Assistive Devices/Equipment: None ADL Screening (condition at time of admission) Patient's cognitive ability adequate to safely complete daily activities?: Yes Is the patient deaf or have difficulty hearing?: No Does the patient have difficulty seeing, even when wearing glasses/contacts?: No Does the patient have difficulty concentrating, remembering, or making decisions?: No Patient able to express need for assistance with ADLs?: Yes Does the patient have difficulty dressing or bathing?: No Independently performs ADLs?: Yes (appropriate for developmental age) Does the patient have difficulty walking or climbing stairs?: No Weakness of Legs: None Weakness of Arms/Hands: Right  Permission Sought/Granted                  Emotional Assessment              Admission diagnosis:  Aortic aneurysm ([place]) [I71.9] Aortic dissection ([place]) [I71.00] Patient Active Problem List   Diagnosis Date Noted  . CKD (chronic kidney disease), stage III ([place])   . Diabetes mellitus ([place])   . Former tobacco use   . HTN (hypertension)   . Hyperlipidemia   . RBBB   . TIA (transient ischemic attack)   . Upper GI bleed   . Right heart failure ([place]) 09/20/2020  . Aortic dissection ([place]) 09/14/2020  . Aortic aneurysm ([place]) 09/13/2020  . Piriformis syndrome, left 09/29/2019  . CKD (chronic kidney disease) stage 3,  GFR 30-59 ml/min ([place]) 09/01/2019  . Diabetes mellitus with renal complications ([place]) 71/16/5790  . GOUT 11/16/2009  . PARESTHESIA, HANDS 03/27/2009  . Pure hypercholesterolemia 06/25/2007  . Microcytic anemia 06/25/2007  . Essential hypertension 06/25/2007  . PUD (peptic ulcer disease) 06/25/2007  . Disorder resulting from impaired renal function 06/25/2007  . History of cardiovascular disorder 06/25/2007  . ANEMIA-NOS 06/25/2007   PCP:  [name], [name], MD Pharmacy:   [id number] [place], [address] [place] [address] Phone: [phone] Fax: [phone]  [facility] [place], [place] [address] Phone: [phone] Fax: [phone]     Social Determinants of Health (SDOH) Interventions    Readmission Risk Interventions No flowsheet data found.

## 2020-10-02 NOTE — Evaluation (Signed)
Occupational Therapy Evaluation Patient Details Name: Danielle Atkins MRN: [id number] DOB: 1935/07/02 Today's Date: 10/02/2020    History of Present Illness 85 yo admitted with chest pain 9/27 with aortic dissection s/p thoracic aortic aneurysm repair on 9/30 who self extubated 10/2 with progressive respiratory failure requiring bipap. PMhx:HTN, HLD, upper GIB, DM, gout   Clinical Impression   Pt independent as PLOF. Enjoys travel. Today Pt is mod A overall for ADL with new sternal precautions. Lots of education with "move in the tube" terminology with compensatory strategies for ADL. Pt is min A for transfers with RW and cues for hand placement, on 3L O2 at rest, and required 4L for ambulation/activity. Pt able to demonstrate IS and pull 500-750 consistently x5, then coughed. Pt will require skilled OT in the actue setting as well as afterwards at SNF - Pt and daughter requesting one close to [place] (where daughter lives). Next session to focus on activity tolerance and standing balance as well as applying sternal precautions.     Follow Up Recommendations  SNF    Equipment Recommendations  Other (comment);3 in 1 bedside commode (defer to next venue)    Recommendations for Other Services       Precautions / Restrictions Precautions Precautions: Sternal;Fall Precaution Booklet Issued: Yes (comment) Precaution Comments: watch sats Restrictions Weight Bearing Restrictions: Yes Other Position/Activity Restrictions: sternal precautions      Mobility Bed Mobility Overal bed mobility: Needs Assistance Bed Mobility: Sit to Supine       Sit to supine: Min guard   General bed mobility comments: min guard for lines, had pt help with LE to push up in the bed  Transfers Overall transfer level: Needs assistance Equipment used: Rolling walker (2 wheeled) Transfers: Sit to/from Stand Sit to Stand: Min guard         General transfer comment: min guard for safety, able to  stand from recliner and from lower toilet, assist for lines     Balance Overall balance assessment: Mild deficits observed, not formally tested                                         ADL either performed or assessed with clinical judgement   ADL Overall ADL's : Needs assistance/impaired Eating/Feeding: Set up;Sitting   Grooming: Wash/dry face;Wash/dry hands;Set up;Sitting   Upper Body Bathing: Min guard;Sitting   Lower Body Bathing: Moderate assistance;Sitting/lateral leans   Upper Body Dressing : Sitting;Moderate assistance;Cueing for compensatory techniques   Lower Body Dressing: Moderate assistance;Sit to/from stand   Toilet Transfer: Min guard;Minimal assistance;Cueing for safety;RW;Ambulation;Regular Toilet;Grab bars Armed forces technical officer Details (indicate cue type and reason): into bathroom, vc for safety with RW Toileting- Clothing Manipulation and Hygiene: Min guard;Sit to/from stand       Functional mobility during ADLs: Min guard;Rolling walker;Cueing for safety General ADL Comments: DOE 3/4 with activity,      Vision         Perception     Praxis      Pertinent Vitals/Pain Pain Assessment: Faces Faces Pain Scale: Hurts a little bit Pain Location: generalized "I feel stiff" Pain Descriptors / Indicators: Grimacing;Guarding;Sore Pain Intervention(s): Monitored during session;Repositioned     Hand Dominance     Extremity/Trunk Assessment Upper Extremity Assessment Upper Extremity Assessment: Generalized weakness   Lower Extremity Assessment Lower Extremity Assessment: Defer to PT evaluation   Cervical / Trunk Assessment Cervical /  Trunk Assessment: Other exceptions Cervical / Trunk Exceptions: sternal precautions   Communication Communication Communication: No difficulties   Cognition Arousal/Alertness: Awake/alert Behavior During Therapy: WFL for tasks assessed/performed Overall Cognitive Status: Within Functional Limits for tasks  assessed                                     General Comments  Pt on 3L O2 and required 4 with activity    Exercises Other Exercises Other Exercises: Incentive Spirometer: 500-725ml quality x 5 reps.   Shoulder Instructions      Home Living Family/patient expects to be discharged to:: Private residence Living Arrangements: Children Available Help at Discharge: Family;Available 24 hours/day Type of Home: House Home Access: Stairs to enter [employer] of Steps: 2   Home Layout: One level     Bathroom Shower/Tub: Occupational psychologist: Standard     Home Equipment: Crutches          Prior Functioning/Environment Level of Independence: Independent        Comments: every year takes an international trip with her daughters (pre-covid)        OT Problem List: Decreased activity tolerance;Impaired balance (sitting and/or standing);Decreased safety awareness;Decreased knowledge of use of DME or AE;Decreased knowledge of precautions;Cardiopulmonary status limiting activity      OT Treatment/Interventions: Self-care/ADL training;Energy conservation;DME and/or AE instruction;Therapeutic activities;Patient/family education;Balance training    OT Goals(Current goals can be found in the care plan section) Acute Rehab OT Goals Patient Stated Goal: return to travel OT Goal Formulation: With patient/family Time For Goal Achievement: 10/16/20 Potential to Achieve Goals: Good ADL Goals Pt Will Perform Grooming: with supervision;standing Pt Will Perform Upper Body Dressing: with supervision;sitting Pt Will Perform Lower Body Dressing: with supervision;sit to/from stand Pt Will Transfer to Toilet: with supervision;ambulating Pt Will Perform Toileting - Clothing Manipulation and hygiene: with modified independence;sit to/from stand Additional ADL Goal #1: Pt will maintain sternal precautions throughout ADL routine with no cues  OT Frequency:  Min 2X/week   Barriers to D/C:            Co-evaluation              AM-PAC OT "6 Clicks" Daily Activity     Outcome Measure Help from another person eating meals?: A Little Help from another person taking care of personal grooming?: A Little Help from another person toileting, which includes using toliet, bedpan, or urinal?: A Little Help from another person bathing (including washing, rinsing, drying)?: A Lot Help from another person to put on and taking off regular upper body clothing?: A Lot Help from another person to put on and taking off regular lower body clothing?: A Lot 6 Click Score: 15   End of Session Equipment Utilized During Treatment: Gait belt;Oxygen (3-4L) Nurse Communication: Mobility status  Activity Tolerance: Patient tolerated treatment well Patient left: in chair;with call bell/phone within reach;with family/visitor present  OT Visit Diagnosis: Unsteadiness on feet (R26.81);Other abnormalities of gait and mobility (R26.89);Muscle weakness (generalized) (M62.81)                Time: 1601-0932 OT Time Calculation (min): 36 min Charges:  OT General Charges $OT Visit: 1 Visit OT Evaluation $OT Eval Moderate Complexity: 1 Mod OT Treatments $Self Care/Home Management : 8-22 mins  Jesse Sans OTR/L Acute Rehabilitation Services Pager: [phone] Office: [place] 10/02/2020, 1:49 PM

## 2020-10-02 NOTE — Progress Notes (Signed)
[id number] Came to see pt to walk. Pt stated she worked earlier with OT and tired now. Encouraged her to walk later with staff. Had her do IS three times with 500-750 ml.  Encouraged 10x an hour. Pt stated her daughter has been helping her with it.  Encouraged IS and walks for pulmonary rehab. Graylon Good RN BSN 10/02/2020 1:28 PM

## 2020-10-03 LAB — BASIC METABOLIC PANEL
Anion gap: 10 (ref 5–15)
BUN: 16 mg/dL (ref 8–23)
CO2: 26 mmol/L (ref 22–32)
Calcium: 8.6 mg/dL — ABNORMAL LOW (ref 8.9–10.3)
Chloride: 102 mmol/L (ref 98–111)
Creatinine, Ser: 1.07 mg/dL — ABNORMAL HIGH (ref 0.44–1.00)
GFR, Estimated: 48 mL/min — ABNORMAL LOW (ref >60)
Glucose, Bld: 81 mg/dL (ref 70–99)
Potassium: 3.8 mmol/L (ref 3.5–5.1)
Sodium: 138 mmol/L (ref 135–145)

## 2020-10-03 LAB — GLUCOSE, CAPILLARY
Glucose-Capillary: 123 mg/dL — ABNORMAL HIGH (ref 70–99)
Glucose-Capillary: 144 mg/dL — ABNORMAL HIGH (ref 70–99)
Glucose-Capillary: 81 mg/dL (ref 70–99)
Glucose-Capillary: 85 mg/dL (ref 70–99)
Glucose-Capillary: 88 mg/dL (ref 70–99)
Glucose-Capillary: 88 mg/dL (ref 70–99)
Glucose-Capillary: 98 mg/dL (ref 70–99)

## 2020-10-03 LAB — COOXEMETRY PANEL
Carboxyhemoglobin: 1.9 % — ABNORMAL HIGH (ref 0.5–1.5)
Methemoglobin: 0.7 % (ref 0.0–1.5)
O2 Saturation: 50.4 %
Total hemoglobin: 7.9 g/dL — ABNORMAL LOW (ref 12.0–16.0)

## 2020-10-03 LAB — SARS CORONAVIRUS 2 BY RT PCR (HOSPITAL ORDER, PERFORMED IN ~~LOC~~ HOSPITAL LAB): SARS Coronavirus 2: NEGATIVE

## 2020-10-03 NOTE — Progress Notes (Signed)
Nutrition Follow-up  DOCUMENTATION CODES:   Not applicable  INTERVENTION:   -Continue Ensure Enlive po BID, each supplement provides 350 kcal and 20 grams of protein  - Continue snacks BID between meals  - Encourage adequate PO intake  NUTRITION DIAGNOSIS:   Inadequate oral intake related to decreased appetite as evidenced by meal completion < 50%, per patient/family report.  Progressing  GOAL:   Patient will meet greater than or equal to 90% of their needs  Progressing  MONITOR:   PO intake, Supplement acceptance, Labs, Weight trends, Skin, I & O's  REASON FOR ASSESSMENT:   Ventilator    ASSESSMENT:   85 year old female who presented to the ED on 9/30 with SOB. PMH of gout, PUD, HTN, anemia, TIA. Pt found to have type I aortic dissection.  09/30 - s/p thoracic ascending aneurysm repair 10/02 - self-extubated, clear liquid diet 10/03 - NPO 10/05 - clear liquid diet, later Heart Healthy diet 10/14 - RHC  Spoke with pt at bedside. Assisted pt in getting set up for breakfast by mixing up oatmeal and opening other foods. Pt reports appetite remains poor but she is really trying to eat. Pt drinking about 1 Ensure daily but is willing to increase this to 2 Ensures. RD encouraged pt to add Ensure to coffee and/or use Ensure when taking PO medications. Discussed with pt the importance of adequate PO intake in healing and working towards discharge. Pt expresses understanding.  Admit weight:73.9kg Current weight:69.5kg  Meal Completion: 0-100%  Medications reviewed and include: dulcolax, colace, Ensure Enlive BID, lasix, SSI q 4 hours, magnesium oxide, protonix, klor-con  Labs reviewed. CBG's: 85-108 x 24 hours  Diet Order:   Diet Order            Diet Heart Room service appropriate? Yes; Fluid consistency: Thin  Diet effective now                 EDUCATION NEEDS:   Education needs have been addressed  Skin:  Skin Assessment: Skin Integrity  Issues: Incisions: chest x 2  Last BM:  10/02/20 medium type 6  Height:   Ht Readings from Last 1 Encounters:  09/13/20 5' 4.5" (1.638 m)    Weight:   Wt Readings from Last 1 Encounters:  10/03/20 69.5 kg    BMI:  Body mass index is 25.89 kg/m.  Estimated Nutritional Needs:   Kcal:  1700-1900  Protein:  90-110 grams  Fluid:  1.7-1.9 L    Gaynell Face, MS, RD, LDN Inpatient Clinical Dietitian Please see [facility] for contact information.

## 2020-10-03 NOTE — Progress Notes (Signed)
Chest tube sutures removed, benzoine and steri strips applied over incision. Painted mid chest incision with betadine.

## 2020-10-03 NOTE — Plan of Care (Signed)

## 2020-10-03 NOTE — Progress Notes (Signed)
Physical Therapy Treatment Patient Details Name: Danielle Atkins MRN: [id number] DOB: 1935/10/06 Today's Date: 10/03/2020    History of Present Illness 85 yo admitted with chest pain 9/27 with aortic dissection s/p thoracic aortic aneurysm repair on 9/30 who self extubated 10/2 with progressive respiratory failure requiring bipap. PMhx:HTN, HLD, upper GIB, DM, gout    PT Comments    Pt agreeable to therapy today, reports that she was having back and chest pain yesterday and that she did not fully participate with rehab. Encouraged pt to ask for pain medication prior to therapy so that she can more fully participate. Pt able to progress ambulation distance with decreased oxygen desaturation today. Focus of ambulation on breath work to keep oxygen levels hight. Pt requires supervision for bed mobility, and min guard for transfers and ambulation. D/c plans remain appropriate. PT will continue to follow acutely.     Follow Up Recommendations  SNF     Equipment Recommendations  Rolling walker with 5" wheels;3in1 (PT)    Recommendations for Other Services OT consult     Precautions / Restrictions Precautions Precautions: Sternal;Fall Precaution Booklet Issued: Yes (comment) Precaution Comments: watch sats Restrictions Weight Bearing Restrictions: No (just sternal precautions) Other Position/Activity Restrictions: sternal precautions    Mobility  Bed Mobility Overal bed mobility: Needs Assistance       Supine to sit: Supervision        Transfers Overall transfer level: Needs assistance Equipment used: Rolling walker (2 wheeled) Transfers: Sit to/from Stand Sit to Stand: Min guard         General transfer comment: min guard for safety, vc for sternal precautions, from lower toilet encouraged pt to place UE on knees to push up   Ambulation/Gait Ambulation/Gait assistance: Min guard Gait Distance (Feet): 200 Feet Assistive device: Rolling walker (2 wheeled) Gait  Pattern/deviations: Decreased stride length;Step-through pattern Gait velocity: decr   General Gait Details: min guard for safety, pt holds her breath when focusing on ambulation, vc for changing focus to breath and let her ambulation speed decrease if necessary        Balance Overall balance assessment: Mild deficits observed, not formally tested                                          Cognition Arousal/Alertness: Awake/alert Behavior During Therapy: WFL for tasks assessed/performed Overall Cognitive Status: Within Functional Limits for tasks assessed                                           General Comments General comments (skin integrity, edema, etc.): Pt on 4L O2 via [place] for ambulation with increased verbal cuing pt able to maintain SaO2 >90%O2 with only occasional drop to 88%O2 able to quickly recover with regained focus, max noted HR 118bpm       Pertinent Vitals/Pain Pain Assessment: Faces Faces Pain Scale: Hurts a little bit Pain Location: back and hip Pain Descriptors / Indicators: Grimacing;Guarding;Sore Pain Intervention(s): Limited activity within patient's tolerance;Monitored during session;Repositioned           PT Goals (current goals can now be found in the care plan section) Acute Rehab PT Goals Patient Stated Goal: return to gambling at the casino, travel PT Goal Formulation: With patient Time For Goal Achievement: 10/02/20  Potential to Achieve Goals: Good Progress towards PT goals: Progressing toward goals    Frequency    Min 3X/week      PT Plan Discharge plan needs to be updated       AM-PAC PT "6 Clicks" Mobility   Outcome Measure  Help needed turning from your back to your side while in a flat bed without using bedrails?: A Little Help needed moving from lying on your back to sitting on the side of a flat bed without using bedrails?: A Little Help needed moving to and from a bed to a chair (including  a wheelchair)?: A Little Help needed standing up from a chair using your arms (e.g., wheelchair or bedside chair)?: A Little Help needed to walk in hospital room?: A Little Help needed climbing 3-5 steps with a railing? : A Lot 6 Click Score: 17    End of Session Equipment Utilized During Treatment: Gait belt Activity Tolerance: Patient tolerated treatment well Patient left: in bed;with call bell/phone within reach Nurse Communication: Mobility status PT Visit Diagnosis: Other abnormalities of gait and mobility (R26.89);Difficulty in walking, not elsewhere classified (R26.2)     Time: 6725-5001 PT Time Calculation (min) (ACUTE ONLY): 27 min  Charges:  $Therapeutic Exercise: 8-22 mins $Therapeutic Activity: 8-22 mins                     Emmery Seiler B. Migdalia Dk PT, DPT Acute Rehabilitation Services Pager [phone] Office [phone]    [place] 10/03/2020, 11:02 AM

## 2020-10-03 NOTE — Progress Notes (Signed)
CARDIAC REHAB PHASE I   PRE:  Rate/Rhythm: 93 SR    BP: sitting 109/70    SaO2: 95 3L  MODE:  Ambulation: 290 ft   POST:  Rate/Rhythm: 110 ST    BP: sitting 103/75     SaO2: 94 3L  Pt able to stand independently and walk slowly in hall with RW, 3L, standby assist. Rest x2 for fatigue/SOB. To bathroom for BM, able to clean herself. Return to recliner. Pt has been using IS more today. More motivated. Will f/u tomorrow. [place], ACSM 10/03/2020 1:42 PM

## 2020-10-03 NOTE — Progress Notes (Addendum)
[address]       [place] [address]             [phone]      6 Days Post-Op Procedure(s) (LRB): RIGHT HEART CATH (N/A) Subjective: Says she conts to slowly feel stronger  Objective: Vital signs in last 24 hours: Temp:  [97.7 F (36.5 C)-98.7 F (37.1 C)] 97.7 F (36.5 C) (10/20 0417) Pulse Rate:  [92-99] 96 (10/20 0417) Cardiac Rhythm: Normal sinus rhythm;Bundle branch block (10/19 1900) Resp:  [19-34] 20 (10/20 0417) BP: (106-119)/(59-73) 112/62 (10/20 0417) SpO2:  [89 %-98 %] 92 % (10/20 0417) Weight:  [69.5 kg] 69.5 kg (10/20 0417)  Hemodynamic parameters for last 24 hours: CVP:  [2 mmHg-4 mmHg] 3 mmHg  Intake/Output from previous day: 10/19 0701 - 10/20 0700 In: 120 [P.O.:120] Out: 450 [Urine:450] Intake/Output this shift: No intake/output data recorded.  General appearance: alert, cooperative and no distress Heart: regular rate and rhythm Lungs: mildly dim in bases Abdomen: benign Extremities: no edema Wound: incis healing well  Lab Results: No results for input(s): WBC, HGB, HCT, PLT in the last 72 hours. BMET: Recent Labs    10/02/20 1020 10/03/20 0500  NA 136 138  K 3.7 3.8  CL 102 102  CO2 26 26  GLUCOSE 126* 81  BUN 15 16  CREATININE 0.95 1.07*  CALCIUM 8.7* 8.6*    PT/INR: No results for input(s): LABPROT, INR in the last 72 hours. ABG    Component Value Date/Time   PHART 7.478 (H) 09/15/2020 2136   HCO3 31.4 (H) 09/27/2020 0806   TCO2 33 (H) 09/27/2020 0806   ACIDBASEDEF 2.0 09/14/2020 0048   O2SAT 50.4 10/03/2020 0500   CBG (last 3)  Recent Labs    10/02/20 2053 10/03/20 0045 10/03/20 0419  GLUCAP 101* 88 85    Meds Scheduled Meds: . allopurinol  300 mg Oral Daily  . aspirin EC  325 mg Oral Daily   Or  . aspirin  324 mg Per Tube Daily  . bisacodyl  10 mg Oral Daily   Or  . bisacodyl  10 mg Rectal Daily  . bisoprolol  2.5 mg Oral Daily  . chlorhexidine gluconate (MEDLINE KIT)  15 mL Mouth  Rinse BID  . Chlorhexidine Gluconate Cloth  6 each Topical Daily  . digoxin  0.125 mg Oral Daily  . docusate sodium  200 mg Oral Daily  . enoxaparin (LOVENOX) injection  30 mg Subcutaneous Q24H  . feeding supplement  237 mL Oral BID BM  . furosemide  40 mg Oral Daily  . Teana Lindahl's butt cream   Topical TID  . insulin aspart  0-24 Units Subcutaneous Q4H  . latanoprost  1 drop Both Eyes QHS  . levothyroxine  25 mcg Oral q AM  . magnesium oxide  400 mg Oral BID  . pantoprazole  40 mg Oral Daily  . potassium chloride  20 mEq Oral Daily  . simvastatin  40 mg Oral q1800  . sodium chloride flush  10-40 mL Intracatheter Q12H  . sodium chloride flush  3 mL Intravenous Q12H   Continuous Infusions: . sodium chloride Stopped (09/14/20 0800)  . lactated ringers Stopped (09/15/20 1905)   PRN Meds:.dextrose, levalbuterol, lip balm, metoprolol tartrate, ondansetron (ZOFRAN) IV, oxyCODONE, sodium chloride flush, traMADol  Xrays No results found.  Assessment/Plan: S/P Procedure(s) (LRB): RIGHT HEART CATH (N/A)  1 afeb, VSS 2 sats ok on 4 l Force, cont to wean 3  Co-OX 50 4 renal fxn stable 5 BS well controlled 6 awaits placement, push rehab and pulm toilet as able  LOS: 20 days    John Giovanni PA-C Pager [phone] 10/03/2020  Walked better in hall today Working on snf placement  I have seen and examined [name] and agree with the above assessment  and plan.  [name] MD [place] [phone] Office [phone] 10/03/2020 3:05 PM

## 2020-10-04 ENCOUNTER — Inpatient Hospital Stay (HOSPITAL_COMMUNITY): Payer: Medicare HMO

## 2020-10-04 DIAGNOSIS — I50811 Acute right heart failure: Secondary | ICD-10-CM | POA: Diagnosis not present

## 2020-10-04 LAB — BASIC METABOLIC PANEL
Anion gap: 9 (ref 5–15)
BUN: 17 mg/dL (ref 8–23)
CO2: 25 mmol/L (ref 22–32)
Calcium: 8.8 mg/dL — ABNORMAL LOW (ref 8.9–10.3)
Chloride: 104 mmol/L (ref 98–111)
Creatinine, Ser: 1.04 mg/dL — ABNORMAL HIGH (ref 0.44–1.00)
GFR, Estimated: 50 mL/min — ABNORMAL LOW (ref >60)
Glucose, Bld: 79 mg/dL (ref 70–99)
Potassium: 3.8 mmol/L (ref 3.5–5.1)
Sodium: 138 mmol/L (ref 135–145)

## 2020-10-04 LAB — GLUCOSE, CAPILLARY
Glucose-Capillary: 74 mg/dL (ref 70–99)
Glucose-Capillary: 90 mg/dL (ref 70–99)
Glucose-Capillary: 104 mg/dL — ABNORMAL HIGH (ref 70–99)
Glucose-Capillary: 153 mg/dL — ABNORMAL HIGH (ref 70–99)
Glucose-Capillary: 76 mg/dL (ref 70–99)

## 2020-10-04 LAB — COOXEMETRY PANEL
Carboxyhemoglobin: 2.2 % — ABNORMAL HIGH (ref 0.5–1.5)
Methemoglobin: 0.8 % (ref 0.0–1.5)
O2 Saturation: 50.4 %
Total hemoglobin: 8 g/dL — ABNORMAL LOW (ref 12.0–16.0)

## 2020-10-04 NOTE — Progress Notes (Addendum)
Patient ID: Danielle Atkins, female   DOB: 1935/04/16, 85 y.o.   MRN: [id number]     Advanced Heart Failure Rounding Note  PCP-Cardiologist: Pixie Casino, MD   Subjective:    S/P 09/13/20 Emergency repair of type 1 aortic dissection with replacement of ascending aorta with 32 mm Hemashield graft with resuspension of the aortic valve with circulatory arrest, hypothermic. Post-op recovery c/b RV dysfunction requiring milrinone.   Now off milrinone. Co-ox marginal at 50% today but she feels ok. Just ambulated w/ CR. Still SOB w/ exertion but no resting dyspnea. CXR today w/ stable to slightly improved appearance of bilateral interstitial densities which could reflect mild edema superimposed on chronic lung disease (chest CT c/w moderate-severe emphysema).   Wt is stable, CVP 12-14.    No CP   Studies:  CT 09/27/20  chest high resolution: No ILD, bilateral lower lobe atelectasis, moderate-severe emphysema.   cMRI 10/12  IMPRESSION: 1. Technically difficult study with significant artifact from pacing wires. 2.  Normal LV size with EF 66%, normal wall motion. 3. Moderately dilated RV with EF 36%, mild-moderate systolic dysfunction. 4. Delayed enhancement images were difficult due to artifact, but no definite LGE was noted. Therefore, no definitive evidence for prior MI, myocarditis, or infiltrative disease.  RHC Procedural Findings: Hemodynamics (mmHg) RA mean 6 RV 44/5 PA 47/21, mean 29 PCWP mean 5 Oxygen saturations: PA 60% AO 93% Cardiac Output (Fick) 6.48  Cardiac Index (Fick) 3.61 PVR 3.7 WU Cardiac Output (Thermo) 5.67 Cardiac Index (Thermo) 3.16  PVR 4.23 WU  Objective:   Weight Range: 70.2 kg Body mass index is 26.14 kg/m.   Vital Signs:   Temp:  [98 F (36.7 C)-98.8 F (37.1 C)] 98.1 F (36.7 C) (10/21 0729) Pulse Rate:  [98-104] 103 (10/21 0729) Resp:  [19-32] 20 (10/21 0729) BP: (97-108)/(60-69) 108/69 (10/21 0729) SpO2:  [90 %-94 %] 94 %  (10/21 0729) Weight:  [70.2 kg] 70.2 kg (10/21 0426) Last BM Date: 10/02/20  Weight change: Filed Weights   10/02/20 0300 10/03/20 0417 10/04/20 0426  Weight: 71 kg 69.5 kg 70.2 kg    Intake/Output:   Intake/Output Summary (Last 24 hours) at 10/04/2020 1304 Last data filed at 10/04/2020 0431 Gross per 24 hour  Intake 120 ml  Output 200 ml  Net -80 ml      Physical Exam    CVP 12  General:  well appearing sitting up in chair. No resp difficulty HEENT: normal Neck: supple.  JVD ~8 cm. Carotids 2+ bilat; no bruits. No lymphadenopathy or thryomegaly appreciated. Cor: PMI nondisplaced.  Regular rate and rhythm Lungs: slightly decreased BS at the bases bilaterally, now wheezing  Abdomen: soft, nontender, nondistended. No hepatosplenomegaly. No bruits or masses. Good bowel sounds. Extremities: no cyanosis, clubbing, rash, edema Neuro: alert & orientedx3, cranial nerves grossly intact. moves all 4 extremities w/o difficulty. Affect pleasant   Telemetry   NSR 90s Personally reviewed  Labs    CBC No results for input(s): WBC, NEUTROABS, HGB, HCT, MCV, PLT in the last 72 hours. Basic Metabolic Panel Recent Labs    10/03/20 0500 10/04/20 0513  NA 138 138  K 3.8 3.8  CL 102 104  CO2 26 25  GLUCOSE 81 79  BUN 16 17  CREATININE 1.07* 1.04*  CALCIUM 8.6* 8.8*   Liver Function Tests No results for input(s): AST, ALT, ALKPHOS, BILITOT, PROT, ALBUMIN in the last 72 hours. No results for input(s): LIPASE, AMYLASE in the last 72 hours.  Cardiac Enzymes No results for input(s): CKTOTAL, CKMB, CKMBINDEX, TROPONINI in the last 72 hours.  BNP: BNP (last 3 results) Recent Labs    09/13/20 1129  BNP 388.1*    ProBNP (last 3 results) No results for input(s): PROBNP in the last 8760 hours.   D-Dimer No results for input(s): DDIMER in the last 72 hours. Hemoglobin A1C No results for input(s): HGBA1C in the last 72 hours. Fasting Lipid Panel No results for input(s):  CHOL, HDL, LDLCALC, TRIG, CHOLHDL, LDLDIRECT in the last 72 hours. Thyroid Function Tests No results for input(s): TSH, T4TOTAL, T3FREE, THYROIDAB in the last 72 hours.  Invalid input(s): FREET3  Other results:   Imaging    DG Chest 1 View  Result Date: 10/04/2020 CLINICAL DATA:  Shortness of breath. Status post recent thoracic aortic aneurysm repair. EXAM: CHEST  1 VIEW COMPARISON:  Chest radiograph 09/26/2020 and CT 09/27/2020 FINDINGS: Postoperative changes are noted from thoracic aortic aneurysm repair. A left PICC terminates near the superior cavoatrial junction, unchanged. The cardiac silhouette remains enlarged. Lung volumes are low. Fine interstitial densities throughout both lungs are stable to slightly improved. Asymmetric left basilar airspace opacity has decreased. There may be a persistent small left pleural effusion. No pneumothorax is identified. IMPRESSION: 1. Stable to slightly improved appearance of bilateral interstitial densities which could reflect mild edema superimposed on chronic lung disease. 2. Mildly improved left basilar opacity, likely atelectasis. Electronically Signed   By: Logan Bores M.D.   On: 10/04/2020 11:21     Medications:     Scheduled Medications: . allopurinol  300 mg Oral Daily  . aspirin EC  325 mg Oral Daily   Or  . aspirin  324 mg Per Tube Daily  . bisacodyl  10 mg Oral Daily   Or  . bisacodyl  10 mg Rectal Daily  . bisoprolol  2.5 mg Oral Daily  . chlorhexidine gluconate (MEDLINE KIT)  15 mL Mouth Rinse BID  . Chlorhexidine Gluconate Cloth  6 each Topical Daily  . digoxin  0.125 mg Oral Daily  . docusate sodium  200 mg Oral Daily  . enoxaparin (LOVENOX) injection  30 mg Subcutaneous Q24H  . feeding supplement  237 mL Oral BID BM  . furosemide  40 mg Oral Daily  . Gerhardt's butt cream   Topical TID  . insulin aspart  0-24 Units Subcutaneous Q4H  . latanoprost  1 drop Both Eyes QHS  . levothyroxine  25 mcg Oral q AM  . magnesium  oxide  400 mg Oral BID  . pantoprazole  40 mg Oral Daily  . potassium chloride  20 mEq Oral Daily  . simvastatin  40 mg Oral q1800  . sodium chloride flush  10-40 mL Intracatheter Q12H  . sodium chloride flush  3 mL Intravenous Q12H    Infusions: . sodium chloride Stopped (09/14/20 0800)  . lactated ringers Stopped (09/15/20 1905)    PRN Medications: dextrose, levalbuterol, lip balm, metoprolol tartrate, ondansetron (ZOFRAN) IV, oxyCODONE, sodium chloride flush, traMADol   Assessment/Plan   1. Acute Type I Aortic Dissection:  Preoperative TEE confirmed evidence of at least moderate to severe aortic insufficiency with the dissection appearing to start just at the commissure between the left and right coronary cusp. Aortic valve tricuspid.  S/p aortic valve repair and ascending aorta replacement 9/30 by Dr. Servando Snare.  Mild AI on followup echo.  - stable. No change 2. RV Failure / Post-Cardiotomy Shock: Prior Echo 08/2010 showed normal LVEF 60-65%, G1DD.  RV appeared normal on parasternal images, however on the apical 4-chamber views it appeared mildly dilated and moderately hypokinetic. Prior to recent acute illness, she had notice gradual change in functional capacity over the last 6-8 months. She is an avid walker and noticed increased dyspnea on exertion and inability to finish her exercise walks. Becoming progressively fatigue but no associated CP.  Intra-operative TEE this admit demonstrated normal LVEF 50-55%. RV moderately enlarged w/ mildly reduced systolic function, moderate TR. Repeat limited post-op echo showed worsening RV function, D-shaped septum, mod-severely dilated RV with moderately decreased systolic function, peak RV-RA gradient 43 mmHg.  RV EF 36% with moderately dilated RV on cMRI, no evidence for inferior/RV infarction by LGE.  V/Q scan was negative for PE. Suspect she had some pre-existing RV dysfunction, ?from COPD/emphysema. She had difficulty weaning off milrinone but now  off with excellent cardiac output on RHC 10/14.  Filling pressures were normal/low.  Today, co-ox 50%. She feels ok. CVP 12 but wt stable  - Continue digoxin 0.125 for RV support, dig level 0.7  - continue lasix 40 mg daily  - On bisoprolol 2.5 mg daily for persistent tachycardia. Co-ox marginal.  3. AKI on Stage III CKD: Resolved. Creatinine 1.0 today 4. Pericardial Effusion: Moderate effusion noted on intraoperative TEE, drained in OR.  Repeat limited echo 10/6 showed no effusion  - no change  5. Anemia: Expected ABLA from surgery  - management per CT surgery  6. Tricuspid Regurgitation: Moderate TR, likely functional  - management of RV failure as above 7. Hypoxemia: She remains on oxygen despite good filling pressures on RHC and low CVP this morning. High resolution CT chest showed bilateral lower lobe atelectasis as wall as moderate-severe COPD.  I suspect that this combination is leading to her persistent hypoxemia.  - Continue incentive spirometry.  - She may need home oxygen for a short period  8. Pulmonary hypertension: Mild pulmonary arterial hypertension on RHC.  Suspect group 3 PH due to ?OSA and COPD.  - Will need sleep study as outpatient.  9. Deconditioning.  - continue to ambulate w/ PT.  - planning SNF placement     Cardiac Meds for Discharge ASA 325 daily  Bisoprolol 2.5 mg daily  Digoxin 0.125 Lasix 40 mg daily  KCl 20 mg daily  Simvastatin 40 nightly   [facility] f/u arranged 10/27. Appt info in AVS      Length of Stay: [address], PA-C  10/04/2020, 1:04 PM  Advanced Heart Failure Team Pager [phone] (M-F; 7a - 4p)  Please contact [facility] for night-coverage after hours (4p -7a ) and weekends on [email or web address]  Agree with the above note.   Patient is on stable po regimen, see above.  Plan for SNF soon.  Will arrange CHF clinic followup.   Loralie Champagne 10/04/2020

## 2020-10-04 NOTE — Progress Notes (Addendum)
CSW spoke with the family, chose [facility]. SNF is waiting on auth through [facility], probably won't get auth until tomorrow. CSW updated family and MD.

## 2020-10-04 NOTE — Progress Notes (Signed)
Discussed IS, sternal precautions, walking guidelines, low sodium diet, daily wts with pt and daughter. Receptive. N/a for CRPII.  [phone] Yves Dill CES, ACSM 1:33 PM 10/04/2020

## 2020-10-04 NOTE — Progress Notes (Signed)
      [address]       [place] [address]             [phone]       Planning for discharge later today vs. Tomorrow. COVID swab done. She will need her PICC line removed before transfer to SNF. Will order stat CXR since patient is still requiring 3-4L [place].    Family was informed by case management   Nicholes Rough, PA-C

## 2020-10-04 NOTE — Progress Notes (Addendum)
[address]       [place] [address]             [phone]      7 Days Post-Op Procedure(s) (LRB): RIGHT HEART CATH (N/A) Subjective: conts to feel better/stronger  Objective: Vital signs in last 24 hours: Temp:  [98 F (36.7 C)-98.8 F (37.1 C)] 98 F (36.7 C) (10/21 0426) Pulse Rate:  [98-104] 104 (10/21 0445) Cardiac Rhythm: Normal sinus rhythm;Bundle branch block (10/20 1900) Resp:  [19-32] 19 (10/21 0445) BP: (97-107)/(60-69) 97/62 (10/21 0426) SpO2:  [90 %-93 %] 90 % (10/21 0445) Weight:  [70.2 kg] 70.2 kg (10/21 0426)  Hemodynamic parameters for last 24 hours: CVP:  [2 mmHg-6 mmHg] 5 mmHg  Intake/Output from previous day: 10/20 0701 - 10/21 0700 In: 480 [P.O.:480] Out: 200 [Urine:200] Intake/Output this shift: No intake/output data recorded.  General appearance: alert, cooperative and no distress Heart: regular rate and rhythm and tachy Lungs: clear to auscultation bilaterally Abdomen: benign Extremities: no edema Wound: incis healing well  Lab Results: No results for input(s): WBC, HGB, HCT, PLT in the last 72 hours. BMET: Recent Labs    10/03/20 0500 10/04/20 0513  NA 138 138  K 3.8 3.8  CL 102 104  CO2 26 25  GLUCOSE 81 79  BUN 16 17  CREATININE 1.07* 1.04*  CALCIUM 8.6* 8.8*    PT/INR: No results for input(s): LABPROT, INR in the last 72 hours. ABG    Component Value Date/Time   PHART 7.478 (H) 09/15/2020 2136   HCO3 31.4 (H) 09/27/2020 0806   TCO2 33 (H) 09/27/2020 0806   ACIDBASEDEF 2.0 09/14/2020 0048   O2SAT 50.4 10/04/2020 0513   CBG (last 3)  Recent Labs    10/03/20 2015 10/03/20 2350 10/04/20 0424  GLUCAP 123* 81 74    Meds Scheduled Meds: . allopurinol  300 mg Oral Daily  . aspirin EC  325 mg Oral Daily   Or  . aspirin  324 mg Per Tube Daily  . bisacodyl  10 mg Oral Daily   Or  . bisacodyl  10 mg Rectal Daily  . bisoprolol  2.5 mg Oral Daily  . chlorhexidine gluconate (MEDLINE KIT)  15  mL Mouth Rinse BID  . Chlorhexidine Gluconate Cloth  6 each Topical Daily  . digoxin  0.125 mg Oral Daily  . docusate sodium  200 mg Oral Daily  . enoxaparin (LOVENOX) injection  30 mg Subcutaneous Q24H  . feeding supplement  237 mL Oral BID BM  . furosemide  40 mg Oral Daily  . Hodan Wurtz's butt cream   Topical TID  . insulin aspart  0-24 Units Subcutaneous Q4H  . latanoprost  1 drop Both Eyes QHS  . levothyroxine  25 mcg Oral q AM  . magnesium oxide  400 mg Oral BID  . pantoprazole  40 mg Oral Daily  . potassium chloride  20 mEq Oral Daily  . simvastatin  40 mg Oral q1800  . sodium chloride flush  10-40 mL Intracatheter Q12H  . sodium chloride flush  3 mL Intravenous Q12H   Continuous Infusions: . sodium chloride Stopped (09/14/20 0800)  . lactated ringers Stopped (09/15/20 1905)   PRN Meds:.dextrose, levalbuterol, lip balm, metoprolol tartrate, ondansetron (ZOFRAN) IV, oxyCODONE, sodium chloride flush, traMADol  Xrays No results found.  Assessment/Plan: S/P Procedure(s) (LRB): RIGHT HEART CATH (N/A)  1 afeb, VSS, mildly tachy at times 2 sats ok on 3 liters 3 co-ox  stable at 50, cont current meds 4 stable renal fxn, creat 1.4 5 awaits SNF bed   LOS: 21 days    Wayne E Gold PA-C Pager [phone] 10/04/2020  Poss snf soon I have seen and examined [facility] and agree with the above assessment  and plan.  Edward B Gerhardt MD Beeper [phone] Office [phone] 10/04/2020 6:14 PM   

## 2020-10-04 NOTE — Progress Notes (Signed)
CARDIAC REHAB PHASE I   PRE:  Rate/Rhythm: 98 SR    BP: sitting 98/72    SaO2: 93 3L  MODE:  Ambulation: 290 ft   POST:  Rate/Rhythm: 116 ST    BP: sitting 114/77     SaO2: 85-86 3L, 90 4L  Pt attempting to eat breakfast, c/o no appetite. Able to move out of bed and stand independently, walked with RW and 3L. Pt c/o significant fatigue and SOB today, even slower with pace, more rest. SaO2 monitored during walk, maintained 88-94 3L. However once she sat down on toilet, down to 85-86 3L. Increased to 4L and had pt practiced deeper breathing with more upright position. Up to 90. Pt cleaned herself and to recliner, SaO2 91 4L. Returned to 3L in Psychologist, occupational. Pt practiced IS, 600 mL. Encouraged more use today. [phone]   [place], ACSM 10/04/2020 10:49 AM

## 2020-10-05 LAB — GLUCOSE, CAPILLARY
Glucose-Capillary: 105 mg/dL — ABNORMAL HIGH (ref 70–99)
Glucose-Capillary: 115 mg/dL — ABNORMAL HIGH (ref 70–99)
Glucose-Capillary: 117 mg/dL — ABNORMAL HIGH (ref 70–99)
Glucose-Capillary: 83 mg/dL (ref 70–99)
Glucose-Capillary: 88 mg/dL (ref 70–99)
Glucose-Capillary: 90 mg/dL (ref 70–99)
Glucose-Capillary: 99 mg/dL (ref 70–99)

## 2020-10-05 MED ORDER — FAMOTIDINE 40 MG PO TABS: 40.0000 mg | ORAL_TABLET | Freq: Every day | ORAL | Status: AC

## 2020-10-05 MED ORDER — METFORMIN HCL 500 MG PO TABS: 500.0000 mg | ORAL_TABLET | Freq: Every day | ORAL | Status: AC

## 2020-10-05 MED ORDER — TRAMADOL HCL 50 MG PO TABS
50.0000 mg | ORAL_TABLET | Freq: Four times a day (QID) | ORAL | 0 refills | Status: AC | PRN
Start: 2020-10-05 — End: 2020-10-12

## 2020-10-05 MED ORDER — LEVOTHYROXINE SODIUM 25 MCG PO TABS: 25.0000 ug | ORAL_TABLET | Freq: Every morning | ORAL | Status: AC

## 2020-10-05 MED ORDER — DIGOXIN 125 MCG PO TABS
0.1250 mg | ORAL_TABLET | Freq: Every day | ORAL | Status: DC
Start: 2020-10-05 — End: 2020-10-11

## 2020-10-05 MED ORDER — FUROSEMIDE 40 MG PO TABS
40.0000 mg | ORAL_TABLET | Freq: Every day | ORAL | Status: DC
Start: 2020-10-05 — End: 2020-10-11

## 2020-10-05 MED ORDER — ALLOPURINOL 300 MG PO TABS: 300.0000 mg | ORAL_TABLET | Freq: Every day | ORAL | Status: AC

## 2020-10-05 MED ORDER — ASPIRIN 325 MG PO TBEC: 325.0000 mg | DELAYED_RELEASE_TABLET | Freq: Every day | ORAL | 0 refills | Status: AC

## 2020-10-05 MED ORDER — POTASSIUM CHLORIDE CRYS ER 20 MEQ PO TBCR: 20.0000 meq | EXTENDED_RELEASE_TABLET | Freq: Every day | ORAL | Status: DC

## 2020-10-05 MED ORDER — SIMVASTATIN 40 MG PO TABS: 40.0000 mg | ORAL_TABLET | Freq: Every day | ORAL | Status: AC

## 2020-10-05 MED ORDER — BISOPROLOL FUMARATE 5 MG PO TABS
2.5000 mg | ORAL_TABLET | Freq: Every day | ORAL | Status: DC
Start: 2020-10-05 — End: 2020-10-24

## 2020-10-05 NOTE — TOC Transition Note (Signed)
Transition of Care [facility]) - CM/SW Discharge Note   Patient Details  Name: Danielle Atkins MRN: [id number] Date of Birth: 10-Feb-1935  [facility]) CM/SW Contact:  [address], [place] Phone Number: 10/05/2020, 4:09 PM   Clinical Narrative:    Patient will DC to: [facility] Anticipated DC date: 10/05/20 Family notified: Yisroel Ramming Transport by: Corey Harold   Per MD patient ready for DC to [facility]. RN to call report prior to discharge [phone]. RN, patient, patient's family, and facility notified of DC. Discharge Summary and FL2 sent to facility. DC packet on chart. Ambulance transport requested for patient.   CSW will sign off for now as social work intervention is no longer needed. Please consult [place] again if new needs arise.     Final next level of care: Skilled Nursing Facility Barriers to Discharge: Barriers Resolved   Patient Goals and CMS Choice Patient states their goals for this hospitalization and ongoing recovery are:: to get better CMS Medicare.gov Compare Post Acute Care list provided to:: Patient Choice offered to / list presented to : Adult Children  Discharge Placement PASRR number recieved: 10/01/20            Patient chooses bed at: [facility] Patient to be transferred to facility by: [place] Name of family member notified: Yisroel Ramming Patient and family notified of of transfer: 10/05/20  Discharge Plan and Services In-house Referral: Clinical Social Work              DME Arranged: Gilford Rile rolling DME Agency: [employer] Date DME Agency Contacted: 09/26/20 Time DME Agency Contacted: [phone] Representative spoke with at DME Agency: adapt rep [place]: PT [place]:  ([place]) Date [place]: 09/26/20 Time [employer]: [place] Representative spoke with at [place]: na  Social Determinants of Health ([place]) Interventions     Readmission Risk Interventions No flowsheet data  found.

## 2020-10-05 NOTE — Discharge Instructions (Signed)
We ask the SNF to please do the following: 1. Please obtain vital signs at least one time daily 2.Please weigh the patient daily. If he or she continues to gain weight or develops lower extremity edema, contact the office at [phone]. 3. Ambulate patient at least three times daily and please use sternal precautions.  Discharge Instructions:  1. You may shower, please wash incisions daily with soap and water and keep dry.  If you wish to cover wounds with dressing you may do so but please keep clean and change daily.  No tub baths or swimming until incisions have completely healed.  If your incisions become red or develop any drainage please call our office at [phone]  2. No Driving until cleared by Dr. Everrett Coombe office and you are no longer using narcotic pain medications  3. Monitor your weight daily.. Please use the same scale and weigh at same time... If you gain 5-10 lbs in 48 hours with associated lower extremity swelling, please contact our office at [phone]  4. Fever of 101.5 for at least 24 hours with no source, please contact our office at [phone]  5. Activity- up as tolerated, please walk at least 3 times per day.  Avoid strenuous activity, no lifting, pushing, or pulling with your arms over 8-10 lbs for a minimum of 6 weeks  6. If any questions or concerns arise, please do not hesitate to contact our office at [phone]

## 2020-10-05 NOTE — Plan of Care (Signed)
  Problem: Inadequate Intake (NI-2.1) Goal: Food and/or nutrient delivery Description: Individualized approach for food/nutrient provision. Outcome: Adequate for Discharge   Problem: Acute Rehab PT Goals(only PT should resolve) Goal: Pt Will Go Supine/Side To Sit Outcome: Adequate for Discharge Goal: Patient Will Transfer Sit To/From Stand Outcome: Adequate for Discharge Goal: Pt Will Transfer Bed To Chair/Chair To Bed Outcome: Adequate for Discharge Goal: Pt Will Ambulate Outcome: Adequate for Discharge Goal: Pt Will Verbalize and Adhere to Precautions While Description: PT Will Verbalize and Adhere to Precautions While Performing Mobility Outcome: Adequate for Discharge Goal: Pt/caregiver will Perform Home Exercise Program Outcome: Adequate for Discharge   Problem: Acute Rehab OT Goals (only OT should resolve) Goal: Pt. Will Perform Grooming Outcome: Adequate for Discharge Goal: Pt. Will Perform Upper Body Dressing Outcome: Adequate for Discharge Goal: Pt. Will Perform Lower Body Dressing Outcome: Adequate for Discharge Goal: Pt. Will Transfer To Toilet Outcome: Adequate for Discharge Goal: Pt. Will Perform Toileting-Clothing Manipulation Outcome: Adequate for Discharge Goal: OT Additional ADL Goal #1 Outcome: Adequate for Discharge

## 2020-10-05 NOTE — Progress Notes (Addendum)
[address]       [place] [address]             [phone]      8 Days Post-Op Procedure(s) (LRB): RIGHT HEART CATH (N/A) Subjective: Feels ok, no new issues, slowly getting stronger  Objective: Vital signs in last 24 hours: Temp:  [97.5 F (36.4 C)-98.6 F (37 C)] 97.5 F (36.4 C) (10/22 0430) Pulse Rate:  [86-103] 99 (10/22 0430) Cardiac Rhythm: Sinus tachycardia (10/22 0528) Resp:  [17-30] 17 (10/22 0430) BP: (98-108)/(60-70) 108/65 (10/22 0430) SpO2:  [93 %-97 %] 95 % (10/22 0430) Weight:  [68.8 kg] 68.8 kg (10/22 0430)  Hemodynamic parameters for last 24 hours:    Intake/Output from previous day: 10/21 0701 - 10/22 0700 In: -  Out: 300 [Urine:300] Intake/Output this shift: No intake/output data recorded.  General appearance: alert, cooperative and no distress Heart: regular rate and rhythm Lungs: mildly dim in bases Abdomen: benign Extremities: no edema Wound: incis healing well  Lab Results: No results for input(s): WBC, HGB, HCT, PLT in the last 72 hours. BMET: Recent Labs    10/03/20 0500 10/04/20 0513  NA 138 138  K 3.8 3.8  CL 102 104  CO2 26 25  GLUCOSE 81 79  BUN 16 17  CREATININE 1.07* 1.04*  CALCIUM 8.6* 8.8*    PT/INR: No results for input(s): LABPROT, INR in the last 72 hours. ABG    Component Value Date/Time   PHART 7.478 (H) 09/15/2020 2136   HCO3 31.4 (H) 09/27/2020 0806   TCO2 33 (H) 09/27/2020 0806   ACIDBASEDEF 2.0 09/14/2020 0048   O2SAT 50.4 10/04/2020 0513   CBG (last 3)  Recent Labs    10/04/20 2000 10/05/20 0000 10/05/20 0432  GLUCAP 76 83 90    Meds Scheduled Meds: . allopurinol  300 mg Oral Daily  . aspirin EC  325 mg Oral Daily   Or  . aspirin  324 mg Per Tube Daily  . bisacodyl  10 mg Oral Daily   Or  . bisacodyl  10 mg Rectal Daily  . bisoprolol  2.5 mg Oral Daily  . chlorhexidine gluconate (MEDLINE KIT)  15 mL Mouth Rinse BID  . Chlorhexidine Gluconate Cloth  6 each  Topical Daily  . digoxin  0.125 mg Oral Daily  . docusate sodium  200 mg Oral Daily  . enoxaparin (LOVENOX) injection  30 mg Subcutaneous Q24H  . feeding supplement  237 mL Oral BID BM  . furosemide  40 mg Oral Daily  . Temeka Pore's butt cream   Topical TID  . insulin aspart  0-24 Units Subcutaneous Q4H  . latanoprost  1 drop Both Eyes QHS  . levothyroxine  25 mcg Oral q AM  . magnesium oxide  400 mg Oral BID  . pantoprazole  40 mg Oral Daily  . potassium chloride  20 mEq Oral Daily  . simvastatin  40 mg Oral q1800  . sodium chloride flush  10-40 mL Intracatheter Q12H  . sodium chloride flush  3 mL Intravenous Q12H   Continuous Infusions: . sodium chloride Stopped (09/14/20 0800)  . lactated ringers Stopped (09/15/20 1905)   PRN Meds:.dextrose, levalbuterol, lip balm, metoprolol tartrate, ondansetron (ZOFRAN) IV, oxyCODONE, sodium chloride flush, traMADol  Xrays DG Chest 1 View  Result Date: 10/04/2020 CLINICAL DATA:  Shortness of breath. Status post recent thoracic aortic aneurysm repair. EXAM: CHEST  1 VIEW COMPARISON:  Chest radiograph 09/26/2020 and CT 09/27/2020  FINDINGS: Postoperative changes are noted from thoracic aortic aneurysm repair. A left PICC terminates near the superior cavoatrial junction, unchanged. The cardiac silhouette remains enlarged. Lung volumes are low. Fine interstitial densities throughout both lungs are stable to slightly improved. Asymmetric left basilar airspace opacity has decreased. There may be a persistent small left pleural effusion. No pneumothorax is identified. IMPRESSION: 1. Stable to slightly improved appearance of bilateral interstitial densities which could reflect mild edema superimposed on chronic lung disease. 2. Mildly improved left basilar opacity, likely atelectasis. Electronically Signed   By: Logan Bores M.D.   On: 10/04/2020 11:21    Assessment/Plan: S/P Procedure(s) (LRB): RIGHT HEART CATH (N/A)   1 afeb, VSS 2 sats ok on 3  liters- will need for at least short term at SNF but can be weaned 3 CBG controlled , no other new labs 4 appreciate AHF team f/u and recs 5 tx to SNF  Today     LOS: 22 days    John Giovanni PA-C Pager [phone] 10/05/2020  Waiting for SNF I have seen and examined [facility] and agree with the above assessment  and plan.  [name] MD Beeper [phone] Office [phone] 10/05/2020 7:47 AM

## 2020-10-06 DIAGNOSIS — D62 Acute posthemorrhagic anemia: Secondary | ICD-10-CM | POA: Diagnosis not present

## 2020-10-06 DIAGNOSIS — E1022 Type 1 diabetes mellitus with diabetic chronic kidney disease: Secondary | ICD-10-CM | POA: Diagnosis present

## 2020-10-06 DIAGNOSIS — I7101 Dissection of thoracic aorta: Secondary | ICD-10-CM | POA: Diagnosis not present

## 2020-10-06 DIAGNOSIS — K56609 Unspecified intestinal obstruction, unspecified as to partial versus complete obstruction: Secondary | ICD-10-CM | POA: Diagnosis not present

## 2020-10-06 DIAGNOSIS — I2721 Secondary pulmonary arterial hypertension: Secondary | ICD-10-CM | POA: Diagnosis not present

## 2020-10-06 DIAGNOSIS — Z79890 Hormone replacement therapy (postmenopausal): Secondary | ICD-10-CM | POA: Diagnosis not present

## 2020-10-06 DIAGNOSIS — I2781 Cor pulmonale (chronic): Secondary | ICD-10-CM | POA: Diagnosis present

## 2020-10-06 DIAGNOSIS — E7800 Pure hypercholesterolemia, unspecified: Secondary | ICD-10-CM | POA: Diagnosis not present

## 2020-10-06 DIAGNOSIS — R634 Abnormal weight loss: Secondary | ICD-10-CM | POA: Diagnosis present

## 2020-10-06 DIAGNOSIS — Z8673 Personal history of transient ischemic attack (TIA), and cerebral infarction without residual deficits: Secondary | ICD-10-CM | POA: Diagnosis not present

## 2020-10-06 DIAGNOSIS — D649 Anemia, unspecified: Secondary | ICD-10-CM | POA: Diagnosis not present

## 2020-10-06 DIAGNOSIS — Z01810 Encounter for preprocedural cardiovascular examination: Secondary | ICD-10-CM | POA: Diagnosis not present

## 2020-10-06 DIAGNOSIS — R410 Disorientation, unspecified: Secondary | ICD-10-CM | POA: Diagnosis not present

## 2020-10-06 DIAGNOSIS — I130 Hypertensive heart and chronic kidney disease with heart failure and stage 1 through stage 4 chronic kidney disease, or unspecified chronic kidney disease: Secondary | ICD-10-CM | POA: Diagnosis not present

## 2020-10-06 DIAGNOSIS — I452 Bifascicular block: Secondary | ICD-10-CM | POA: Diagnosis present

## 2020-10-06 DIAGNOSIS — I50813 Acute on chronic right heart failure: Secondary | ICD-10-CM | POA: Diagnosis not present

## 2020-10-06 DIAGNOSIS — Z79899 Other long term (current) drug therapy: Secondary | ICD-10-CM | POA: Diagnosis not present

## 2020-10-06 DIAGNOSIS — M79673 Pain in unspecified foot: Secondary | ICD-10-CM | POA: Diagnosis not present

## 2020-10-06 DIAGNOSIS — K8000 Calculus of gallbladder with acute cholecystitis without obstruction: Secondary | ICD-10-CM | POA: Diagnosis not present

## 2020-10-06 DIAGNOSIS — R110 Nausea: Secondary | ICD-10-CM | POA: Diagnosis not present

## 2020-10-06 DIAGNOSIS — I712 Thoracic aortic aneurysm, without rupture: Secondary | ICD-10-CM | POA: Diagnosis present

## 2020-10-06 DIAGNOSIS — R52 Pain, unspecified: Secondary | ICD-10-CM | POA: Diagnosis not present

## 2020-10-06 DIAGNOSIS — G9340 Encephalopathy, unspecified: Secondary | ICD-10-CM | POA: Diagnosis not present

## 2020-10-06 DIAGNOSIS — Z7982 Long term (current) use of aspirin: Secondary | ICD-10-CM | POA: Diagnosis not present

## 2020-10-06 DIAGNOSIS — I50812 Chronic right heart failure: Secondary | ICD-10-CM | POA: Diagnosis not present

## 2020-10-06 DIAGNOSIS — R2689 Other abnormalities of gait and mobility: Secondary | ICD-10-CM | POA: Diagnosis not present

## 2020-10-06 DIAGNOSIS — E039 Hypothyroidism, unspecified: Secondary | ICD-10-CM | POA: Diagnosis present

## 2020-10-06 DIAGNOSIS — N179 Acute kidney failure, unspecified: Secondary | ICD-10-CM | POA: Diagnosis not present

## 2020-10-06 DIAGNOSIS — Z809 Family history of malignant neoplasm, unspecified: Secondary | ICD-10-CM | POA: Diagnosis not present

## 2020-10-06 DIAGNOSIS — N183 Chronic kidney disease, stage 3 unspecified: Secondary | ICD-10-CM | POA: Diagnosis not present

## 2020-10-06 DIAGNOSIS — E785 Hyperlipidemia, unspecified: Secondary | ICD-10-CM | POA: Diagnosis not present

## 2020-10-06 DIAGNOSIS — I719 Aortic aneurysm of unspecified site, without rupture: Secondary | ICD-10-CM | POA: Diagnosis not present

## 2020-10-06 DIAGNOSIS — Z48812 Encounter for surgical aftercare following surgery on the circulatory system: Secondary | ICD-10-CM | POA: Diagnosis not present

## 2020-10-06 DIAGNOSIS — Z8679 Personal history of other diseases of the circulatory system: Secondary | ICD-10-CM | POA: Diagnosis not present

## 2020-10-06 DIAGNOSIS — Z7984 Long term (current) use of oral hypoglycemic drugs: Secondary | ICD-10-CM | POA: Diagnosis not present

## 2020-10-06 DIAGNOSIS — I10 Essential (primary) hypertension: Secondary | ICD-10-CM | POA: Diagnosis not present

## 2020-10-06 DIAGNOSIS — Z23 Encounter for immunization: Secondary | ICD-10-CM | POA: Diagnosis not present

## 2020-10-06 DIAGNOSIS — K8020 Calculus of gallbladder without cholecystitis without obstruction: Secondary | ICD-10-CM | POA: Diagnosis not present

## 2020-10-06 DIAGNOSIS — I50811 Acute right heart failure: Secondary | ICD-10-CM | POA: Diagnosis not present

## 2020-10-06 DIAGNOSIS — I4510 Unspecified right bundle-branch block: Secondary | ICD-10-CM | POA: Diagnosis not present

## 2020-10-06 DIAGNOSIS — I7100 Dissection of unspecified site of aorta: Secondary | ICD-10-CM | POA: Diagnosis not present

## 2020-10-06 DIAGNOSIS — R109 Unspecified abdominal pain: Secondary | ICD-10-CM | POA: Diagnosis not present

## 2020-10-06 DIAGNOSIS — R531 Weakness: Secondary | ICD-10-CM | POA: Diagnosis not present

## 2020-10-06 DIAGNOSIS — R6510 Systemic inflammatory response syndrome (SIRS) of non-infectious origin without acute organ dysfunction: Secondary | ICD-10-CM | POA: Diagnosis not present

## 2020-10-06 DIAGNOSIS — I50810 Right heart failure, unspecified: Secondary | ICD-10-CM | POA: Diagnosis not present

## 2020-10-06 DIAGNOSIS — I071 Rheumatic tricuspid insufficiency: Secondary | ICD-10-CM | POA: Diagnosis not present

## 2020-10-06 DIAGNOSIS — R627 Adult failure to thrive: Secondary | ICD-10-CM | POA: Diagnosis not present

## 2020-10-06 DIAGNOSIS — J432 Centrilobular emphysema: Secondary | ICD-10-CM | POA: Diagnosis present

## 2020-10-06 DIAGNOSIS — J9811 Atelectasis: Secondary | ICD-10-CM | POA: Diagnosis not present

## 2020-10-06 DIAGNOSIS — Z8711 Personal history of peptic ulcer disease: Secondary | ICD-10-CM | POA: Diagnosis not present

## 2020-10-06 DIAGNOSIS — J449 Chronic obstructive pulmonary disease, unspecified: Secondary | ICD-10-CM | POA: Diagnosis not present

## 2020-10-06 DIAGNOSIS — I361 Nonrheumatic tricuspid (valve) insufficiency: Secondary | ICD-10-CM | POA: Diagnosis not present

## 2020-10-06 DIAGNOSIS — E119 Type 2 diabetes mellitus without complications: Secondary | ICD-10-CM | POA: Diagnosis not present

## 2020-10-06 DIAGNOSIS — Z87891 Personal history of nicotine dependence: Secondary | ICD-10-CM | POA: Diagnosis not present

## 2020-10-06 DIAGNOSIS — R4182 Altered mental status, unspecified: Secondary | ICD-10-CM | POA: Diagnosis not present

## 2020-10-06 DIAGNOSIS — Z20822 Contact with and (suspected) exposure to covid-19: Secondary | ICD-10-CM | POA: Diagnosis not present

## 2020-10-06 DIAGNOSIS — R5383 Other fatigue: Secondary | ICD-10-CM | POA: Diagnosis not present

## 2020-10-06 DIAGNOSIS — R0989 Other specified symptoms and signs involving the circulatory and respiratory systems: Secondary | ICD-10-CM | POA: Diagnosis not present

## 2020-10-06 DIAGNOSIS — R1084 Generalized abdominal pain: Secondary | ICD-10-CM | POA: Diagnosis not present

## 2020-10-06 DIAGNOSIS — R0602 Shortness of breath: Secondary | ICD-10-CM | POA: Diagnosis not present

## 2020-10-06 DIAGNOSIS — G459 Transient cerebral ischemic attack, unspecified: Secondary | ICD-10-CM | POA: Diagnosis not present

## 2020-10-06 DIAGNOSIS — E1122 Type 2 diabetes mellitus with diabetic chronic kidney disease: Secondary | ICD-10-CM | POA: Diagnosis not present

## 2020-10-06 DIAGNOSIS — R5381 Other malaise: Secondary | ICD-10-CM | POA: Diagnosis not present

## 2020-10-06 NOTE — Progress Notes (Signed)
Pt discharged in stable condition via [place]tar with all belongings and discharge teaching.     10/06/20 0211  Vitals  Temp 98 F (36.7 C)  Temp Source Oral  BP 103/66  MAP (mmHg) 78  BP Location Right Arm  BP Method Automatic  Patient Position (if appropriate) Lying  Pulse Rate (!) 102  Pulse Rate Source Monitor  ECG Heart Rate (!) 102  Resp (!) 24  Level of Consciousness  Level of Consciousness Alert  MEWS COLOR  MEWS Score Color Yellow  Oxygen Therapy  SpO2 93 %  O2 Device Nasal Cannula  O2 Flow Rate (L/min) 2 L/min  Pain Assessment  Pain Scale 0-10  Pain Score 0  ECG Monitoring  Cardiac Rhythm NSR;ST  Glasgow Coma Scale  Eye Opening 4  Best Verbal Response (NON-intubated) 5  Best Motor Response 6  Glasgow Coma Scale Score 15  MEWS Score  MEWS Temp 0  MEWS Systolic 0  MEWS Pulse 1  MEWS RR 1  MEWS LOC 0  MEWS Score 2

## 2020-10-10 ENCOUNTER — Ambulatory Visit (HOSPITAL_COMMUNITY)
Admit: 2020-10-10 | Discharge: 2020-10-10 | Disposition: A | Discharge status: Home / Self Care | Payer: Medicare HMO | Attending: Cardiology | Admitting: Cardiology

## 2020-10-10 ENCOUNTER — Encounter (HOSPITAL_COMMUNITY): Payer: Self-pay

## 2020-10-10 ENCOUNTER — Other Ambulatory Visit: Payer: Self-pay

## 2020-10-10 VITALS — BP 114/86 | HR 96 | Wt 150.6 lb

## 2020-10-10 DIAGNOSIS — I071 Rheumatic tricuspid insufficiency: Secondary | ICD-10-CM | POA: Diagnosis not present

## 2020-10-10 DIAGNOSIS — E785 Hyperlipidemia, unspecified: Secondary | ICD-10-CM | POA: Insufficient documentation

## 2020-10-10 DIAGNOSIS — E7800 Pure hypercholesterolemia, unspecified: Secondary | ICD-10-CM | POA: Diagnosis not present

## 2020-10-10 DIAGNOSIS — Z87891 Personal history of nicotine dependence: Secondary | ICD-10-CM | POA: Diagnosis not present

## 2020-10-10 DIAGNOSIS — Z7984 Long term (current) use of oral hypoglycemic drugs: Secondary | ICD-10-CM | POA: Insufficient documentation

## 2020-10-10 DIAGNOSIS — I2721 Secondary pulmonary arterial hypertension: Secondary | ICD-10-CM | POA: Insufficient documentation

## 2020-10-10 DIAGNOSIS — I50812 Chronic right heart failure: Secondary | ICD-10-CM

## 2020-10-10 DIAGNOSIS — N183 Chronic kidney disease, stage 3 unspecified: Secondary | ICD-10-CM | POA: Insufficient documentation

## 2020-10-10 DIAGNOSIS — Z7982 Long term (current) use of aspirin: Secondary | ICD-10-CM | POA: Insufficient documentation

## 2020-10-10 DIAGNOSIS — E1122 Type 2 diabetes mellitus with diabetic chronic kidney disease: Secondary | ICD-10-CM | POA: Diagnosis not present

## 2020-10-10 DIAGNOSIS — Z79899 Other long term (current) drug therapy: Secondary | ICD-10-CM | POA: Insufficient documentation

## 2020-10-10 DIAGNOSIS — I130 Hypertensive heart and chronic kidney disease with heart failure and stage 1 through stage 4 chronic kidney disease, or unspecified chronic kidney disease: Secondary | ICD-10-CM | POA: Diagnosis not present

## 2020-10-10 LAB — BASIC METABOLIC PANEL
Anion gap: 10 (ref 5–15)
BUN: 22 mg/dL (ref 8–23)
CO2: 24 mmol/L (ref 22–32)
Calcium: 9.2 mg/dL (ref 8.9–10.3)
Chloride: 102 mmol/L (ref 98–111)
Creatinine, Ser: 1.21 mg/dL — ABNORMAL HIGH (ref 0.44–1.00)
GFR, Estimated: 44 mL/min — ABNORMAL LOW (ref >60)
Glucose, Bld: 117 mg/dL — ABNORMAL HIGH (ref 70–99)
Potassium: 4.1 mmol/L (ref 3.5–5.1)
Sodium: 136 mmol/L (ref 135–145)

## 2020-10-10 LAB — DIGOXIN LEVEL: Digoxin Level: 0.9 ng/mL — ABNORMAL LOW (ref 1.0–2.0)

## 2020-10-10 NOTE — Patient Instructions (Signed)
Labs done today, your results will be available in MyChart, we will contact you for abnormal readings.  We will not schedule a follow up appointment due to your pending move to [place]. If you ever need to see Dr. Aundra Dubin, just give [place] a call!  If you have any questions or concerns before your next appointment please send [place] a message through [place] or call our office at [phone].    TO LEAVE A MESSAGE FOR THE NURSE SELECT OPTION 2, PLEASE LEAVE A MESSAGE INCLUDING:  YOUR NAME  DATE OF BIRTH  CALL BACK NUMBER  REASON FOR CALL**this is important as we prioritize the call backs  YOU WILL RECEIVE A CALL BACK THE SAME DAY AS LONG AS YOU CALL BEFORE 4:00 PM

## 2020-10-10 NOTE — Progress Notes (Signed)
Advanced Heart Failure Clinic Note   Referring Physician: PCP: [name], [name], MD PCP-Cardiologist: Pixie Casino, MD  AHF: Dr. Aundra Dubin  CT Surgery: Dr. Servando Snare   Reason for Visit: [place] hospital f/u, chronic RV dysfunction   HPI: 85 y/o AAF recently admitted to [facility] for acute type 1 aortic dissection, s/p emergent surgical repair w/ replacement of ascending aorta with 32 mm Hemashield graft with resuspension of the aortic valve with circulatory arrest. Post-op recovery c/b RV dysfunction requiring milrinone. Echo showed normal LVEF  50-55%. RV severely dilated w/ severe systolic dysfunction.  RHC showed normal right and left heart filling pressures, mild-moderate pulmonary arterial hypertension and preserved cardiac output on milrinone. V/Q scan was negative for CTEPH. Chest CT c/w moderate-severe emphysema.   She was diuresed and able to wean off milrinone but continued on supp O2. Was discharged to SNF for extended rehab. D/c wt was 154 lb.  She presents to clinic today for post hospital f/u. Here with her daughter. In wheelchair. Reports she feels weak and tired w/ rehab. No significant exertional dyspnea but she remains in 3L [place]. Not eating much at rehab facility. Appetite is poor. Her wt is down 4 lb. No peripheral edema. BP ok 114/86. EKG shows NSR, 97 bpm. Pt and daughter report she plans to move to [place] to live with her other daughter as soon as she gets discharged from SNF.     Review of Systems: [y] = yes, [ ]  = no   General: Weight gain [ ] ; Weight loss [ ] ; Anorexia [ Y]; Fatigue [Y ]; Fever [ ] ; Chills [ ] ; Weakness [ Y]  Cardiac: Chest pain/pressure [ ] ; Resting SOB [ ] ; Exertional SOB [ ] ; Orthopnea [ ] ; Pedal Edema [ ] ; Palpitations [ ] ; Syncope [ ] ; Presyncope [ ] ; Paroxysmal nocturnal dyspnea[ ]   Pulmonary: Cough [ ] ; Wheezing[ ] ; Hemoptysis[ ] ; Sputum [ ] ; Snoring [ ]   GI: Vomiting[ ] ; Dysphagia[ ] ; Melena[ ] ; Hematochezia [ ] ; Heartburn[ ] ; Abdominal pain [  ]; Constipation [ ] ; Diarrhea [ ] ; BRBPR [ ]   GU: Hematuria[ ] ; Dysuria [ ] ; Nocturia[ ]   Vascular: Pain in legs with walking [ ] ; Pain in feet with lying flat [ ] ; Non-healing sores [ ] ; Stroke [ ] ; TIA [ ] ; Slurred speech [ ] ;  Neuro: Headaches[ ] ; Vertigo[ ] ; Seizures[ ] ; Paresthesias[ ] ;Blurred vision [ ] ; Diplopia [ ] ; Vision changes [ ]   Ortho/Skin: Arthritis [ ] ; Joint pain [ ] ; Muscle pain [ ] ; Joint swelling [ ] ; Back Pain [ ] ; Rash [ ]   Psych: Depression[ ] ; Anxiety[ ]   Heme: Bleeding problems [ ] ; Clotting disorders [ ] ; Anemia [ ]   Endocrine: Diabetes [ ] ; Thyroid dysfunction[ ]    Past Medical History:  Diagnosis Date  . ANEMIA-NOS 06/25/2007  . Aortic aneurysm ([place]) 09/13/2020  . Aortic dissection ([place]) 09/14/2020  . CKD (chronic kidney disease) stage 3, GFR 30-59 ml/min (HCC) 09/01/2019  . CKD (chronic kidney disease), stage III ([place])   . Diabetes mellitus ([place])   . Diabetes mellitus with renal complications ([place]) 02/19/8587  . Disorder resulting from impaired renal function 06/25/2007   Qualifier: Diagnosis of  By: Paulina Fusi RN, Daine Gravel   . Essential hypertension 06/25/2007   Qualifier: Diagnosis of  By: Paulina Fusi RN, Daine Gravel   . Former tobacco use   . GOUT 11/16/2009  . History of cardiovascular disorder 06/25/2007   Qualifier: Diagnosis of  By: Paulina Fusi RN, Daine Gravel   .  HTN (hypertension)   . Hyperlipidemia   . Microcytic anemia 06/25/2007   Qualifier: Diagnosis of  By: Paulina Fusi RN, Daine Gravel   . PARESTHESIA, HANDS 03/27/2009  . PEPTIC ULCER DISEASE 06/25/2007  . Piriformis syndrome, left 09/29/2019  . PUD (peptic ulcer disease) 06/25/2007   Qualifier: Diagnosis of  By: Paulina Fusi RN, Daine Gravel   . Pure hypercholesterolemia 06/25/2007   Qualifier: Diagnosis of  By: Paulina Fusi RN, Daine Gravel   . RBBB   . Right heart failure ([place]) 09/20/2020  . TIA (transient ischemic attack)   . Upper GI bleed     Current Outpatient Medications  Medication Sig Dispense Refill  . albuterol (PROAIR HFA) 108  (90 Base) MCG/ACT inhaler Inhale 2 puffs into the lungs every 6 (six) hours as needed for wheezing. 18 g 2  . allopurinol (ZYLOPRIM) 300 MG tablet Take 1 tablet (300 mg total) by mouth daily.    . Ascorbic Acid (VITAMIN C) 100 MG tablet Take 1,000 mg by mouth daily. Takes C Complex    . aspirin EC 325 MG EC tablet Take 1 tablet (325 mg total) by mouth daily. 30 tablet 0  . bisoprolol (ZEBETA) 5 MG tablet Take 0.5 tablets (2.5 mg total) by mouth daily.    . cholecalciferol (VITAMIN D3) 25 MCG (1000 UT) tablet Take 5,000 Units by mouth daily.     . digoxin (LANOXIN) 0.125 MG tablet Take 1 tablet (0.125 mg total) by mouth daily.    . famotidine (PEPCID) 40 MG tablet Take 1 tablet (40 mg total) by mouth daily.    . furosemide (LASIX) 40 MG tablet Take 1 tablet (40 mg total) by mouth daily. 30 tablet   . latanoprost (XALATAN) 0.005 % ophthalmic solution 1 drop daily.    Marland Kitchen levothyroxine (SYNTHROID) 25 MCG tablet Take 1 tablet (25 mcg total) by mouth in the morning.    . metFORMIN (GLUCOPHAGE) 500 MG tablet Take 1 tablet (500 mg total) by mouth daily with breakfast.    . potassium chloride SA (KLOR-CON) 20 MEQ tablet Take 1 tablet (20 mEq total) by mouth daily.    . simvastatin (ZOCOR) 40 MG tablet Take 1 tablet (40 mg total) by mouth daily.    . traMADol (ULTRAM) 50 MG tablet Take 1 tablet (50 mg total) by mouth every 6 (six) hours as needed for up to 7 days for moderate pain. 28 tablet 0   No current facility-administered medications for this encounter.    No Known Allergies    Social History   Socioeconomic History  . Marital status: Widowed    Spouse name: Not on file  . Number of children: Not on file  . Years of education: Not on file  . Highest education level: Not on file  Occupational History  . Not on file  Tobacco Use  . Smoking status: Former Smoker    Quit date: 12/16/1999    Years since quitting: 20.8  . Smokeless tobacco: Never Used  Vaping Use  . Vaping Use: Never used   Substance and Sexual Activity  . Alcohol use: No  . Drug use: No  . Sexual activity: Not on file  Other Topics Concern  . Not on file  Social History Narrative  . Not on file   Social Determinants of Health   Financial Resource Strain: Low Risk   . Difficulty of Paying Living Expenses: Not hard at all  Food Insecurity:   . Worried About Charity fundraiser in the Last  Year: Not on file  . Ran Out of Food in the Last Year: Not on file  Transportation Needs: No Transportation Needs  . Lack of Transportation (Medical): No  . Lack of Transportation (Non-Medical): No  Physical Activity:   . Days of Exercise per Week: Not on file  . Minutes of Exercise per Session: Not on file  Stress:   . Feeling of Stress : Not on file  Social Connections:   . Frequency of Communication with Friends and Family: Not on file  . Frequency of Social Gatherings with Friends and Family: Not on file  . Attends Religious Services: Not on file  . Active Member of Clubs or Organizations: Not on file  . Attends Archivist Meetings: Not on file  . Marital Status: Not on file  Intimate Partner Violence:   . Fear of Current or Ex-Partner: Not on file  . Emotionally Abused: Not on file  . Physically Abused: Not on file  . Sexually Abused: Not on file      Family History  Problem Relation Age of Onset  . Cancer Mother     Vitals:   10/10/20 0931  BP: 114/86  Pulse: 96  SpO2: 99%  Weight: 68.3 kg (150 lb 9.6 oz)     PHYSICAL EXAM: General:  Fatigue appearing elderly female, in wheelchair. No respiratory difficulty HEENT: normal Neck: supple. JVD ~8-9 cm. Carotids 2+ bilat; no bruits. No lymphadenopathy or thyromegaly appreciated. Cor: PMI nondisplaced. Regular rate & rhythm. No rubs, gallops or murmurs. Sternotomy site well healed Lungs: faint crackles at base of RLL that clear w/ deep cough  Abdomen: soft, nontender, nondistended. No hepatosplenomegaly. No bruits or masses. Good  bowel sounds. Extremities: no cyanosis, clubbing, rash, edema Neuro: alert & oriented x 3, cranial nerves grossly intact. moves all 4 extremities w/o difficulty. Affect pleasant.  ECG: NSR 97 bpm    ASSESSMENT & PLAN:  1. Type I Aortic Dissection:  - S/p emergent aortic valve repair and ascending aorta replacement 9/30 by Dr. Servando Snare.  Mild AI on followup echo. - stable, Sternotomy site ok. BP well controlled - has f/u w/ CT surgery next week  2. Chronic Right Sided Heart Failure:  - Echo 08/2010 showed normal LVEF 60-65%, G1DD. RV appeared normal onparasternal images,however on the apical 4-chamber views it appearedmildly dilated and moderately hypokinetic. - Intra-operative TEE at time of recent aortic repair, 9/21,demonstrated normal LVEF 50-55%. RV moderately enlarged w/ mildly reduced systolic function, moderate TR.Repeat limited post-op echo showed worsening RV function, D-shaped septum, mod-severely dilated RV with moderately decreased systolic function, peak RV-RA gradient 43 mmHg.  RV EF 36% with moderately dilated RV on cMRI, no evidence for inferior/RV infarction by LGE.   - V/Q scan was negative for PE. Suspect she had some pre-existing RV dysfunction, ?from COPD/emphysema (consistent findings on chest CT).  - She is NYHA Class III, confounded by deconditioning from recent hospitalization  - Fluid status ok, wt down 4 lb since hospital d/c - Check BMP today. If suggestive of dehydration, may need to reduce lasix dose given poor PO intake and weakness - continue digoxin 0.125 mg daily. Check Dig level today - continue bisoprolol 2.5 mg daily   3. Pulmonary hypertension: Mild pulmonary arterial hypertension on recent RHC.  Suspect group 3 PH due to ?OSA and COPD.  - Will need sleep study as outpatient. We discussed this today. Recommended that she discuss w/ new provider once she moves to [place]   4. Tricuspid  Regurgitation: Moderate TR, likely functional  - management of RV  failure as above  5. Stage III CKD - check BMP today   6. Deconditioning - continue PT at SNF    7. Nutrition  - reports poor PO intake - encouraged nutritional supplements, Boost or Ensure  - check BMP today to see if she may be dehydrated   Pt advised to establish care w/ a cardiologist and PCP once she moves to [place]. We will be happy to follow her if she returns to [facility].    Lyda Jester, PA-C 10/10/20

## 2020-10-11 ENCOUNTER — Telehealth (HOSPITAL_COMMUNITY): Payer: Self-pay

## 2020-10-11 DIAGNOSIS — E1122 Type 2 diabetes mellitus with diabetic chronic kidney disease: Secondary | ICD-10-CM | POA: Diagnosis not present

## 2020-10-11 DIAGNOSIS — I10 Essential (primary) hypertension: Secondary | ICD-10-CM | POA: Diagnosis not present

## 2020-10-11 DIAGNOSIS — E785 Hyperlipidemia, unspecified: Secondary | ICD-10-CM | POA: Diagnosis not present

## 2020-10-11 DIAGNOSIS — R531 Weakness: Secondary | ICD-10-CM | POA: Diagnosis not present

## 2020-10-11 MED ORDER — POTASSIUM CHLORIDE CRYS ER 20 MEQ PO TBCR
EXTENDED_RELEASE_TABLET | ORAL | Status: DC
Start: 2020-10-11 — End: 2020-10-24

## 2020-10-11 MED ORDER — FUROSEMIDE 40 MG PO TABS
ORAL_TABLET | ORAL | Status: DC
Start: 2020-10-11 — End: 2020-10-24

## 2020-10-11 MED ORDER — DIGOXIN 125 MCG PO TABS
0.0625 mg | ORAL_TABLET | Freq: Every day | ORAL | Status: DC
Start: 2020-10-11 — End: 2020-10-23

## 2020-10-11 NOTE — Telephone Encounter (Signed)
   Malena Edman, RN  10/11/2020 9:59 AM EDT Back to Top    Spoke to patient's nurse and advised. She verbalized understanding. Med list updated. They will draw blood and fax results   Malena Edman, RN  10/10/2020 5:17 PM EDT     [facility]. No answer, unable to leave message, will call again later

## 2020-10-11 NOTE — Telephone Encounter (Signed)
-----   Message from Consuelo Pandy, [place] sent at 10/10/2020  4:53 PM EDT ----- SCr up slightly since hospital d/c. Poor PO intake. Reduce lasix dose to alternate 40 mg one day and 20 mg the next and KCl alternating 20/10 mEq Dig level slightly elevated, reduce digoxin to 0.0625 mg daily  Repeat BMP and Dig level in 1 week (SNF can draw labs and fax results to our office)

## 2020-10-16 DIAGNOSIS — R2689 Other abnormalities of gait and mobility: Secondary | ICD-10-CM | POA: Diagnosis not present

## 2020-10-16 DIAGNOSIS — M79673 Pain in unspecified foot: Secondary | ICD-10-CM | POA: Diagnosis not present

## 2020-10-16 DIAGNOSIS — R5381 Other malaise: Secondary | ICD-10-CM | POA: Diagnosis not present

## 2020-10-17 ENCOUNTER — Other Ambulatory Visit: Payer: Self-pay | Admitting: Cardiothoracic Surgery

## 2020-10-17 DIAGNOSIS — I7100 Dissection of unspecified site of aorta: Secondary | ICD-10-CM

## 2020-10-18 ENCOUNTER — Encounter: Payer: Self-pay | Admitting: Cardiothoracic Surgery

## 2020-10-18 ENCOUNTER — Ambulatory Visit
Admission: RE | Admit: 2020-10-18 | Discharge: 2020-10-18 | Disposition: A | Discharge status: Home / Self Care | Payer: Medicare HMO | Source: Ambulatory Visit | Attending: Cardiothoracic Surgery | Admitting: Cardiothoracic Surgery

## 2020-10-18 ENCOUNTER — Ambulatory Visit (INDEPENDENT_AMBULATORY_CARE_PROVIDER_SITE_OTHER): Payer: Self-pay | Admitting: Cardiothoracic Surgery

## 2020-10-18 ENCOUNTER — Other Ambulatory Visit: Payer: Self-pay

## 2020-10-18 VITALS — BP 142/74 | HR 95 | Resp 18 | Ht 64.5 in | Wt 146.4 lb

## 2020-10-18 DIAGNOSIS — I7101 Dissection of thoracic aorta: Secondary | ICD-10-CM

## 2020-10-18 DIAGNOSIS — I7100 Dissection of unspecified site of aorta: Secondary | ICD-10-CM

## 2020-10-18 DIAGNOSIS — I71019 Dissection of thoracic aorta, unspecified: Secondary | ICD-10-CM

## 2020-10-18 DIAGNOSIS — R0602 Shortness of breath: Secondary | ICD-10-CM | POA: Diagnosis not present

## 2020-10-18 NOTE — Progress Notes (Signed)
[address]       [place] [address]             [phone]                  Danielle Atkins [place] Medical Record #[id number] Date of Birth: 1935-09-29  Referring HC:[id number] Lucilla Edin* Primary Cardiology: Primary Care:Hernandez Everardo Beals, MD  Chief Complaint:  Follow Up Visit OPERATIVE REPORT DATE OF PROCEDURE:  09/13/2020 PREOPERATIVE DIAGNOSIS:  Acute type 1 aortic dissection of at least 24 hours duration. POSTOPERATIVE DIAGNOSIS:  Acute type 1 aortic dissection of at least 24 hours duration. SURGICAL PROCEDURE:  Emergency repair of type 1 aortic dissection with replacement of ascending aorta with 32 mm Hemashield graft with resuspension of the aortic valve with circulatory arrest, hypothermic. SURGEON:  Lanelle Bal, MD   History of Present Illness:     Patient comes to the office from the nursing home where she had been discharged to following a prolonged hospitalization after repair of acute type I aortic dissection and resuspension of her aortic valve.  She had been making very slow progress at the nursing home, but was doing physical therapy.  Mild lower extremity edema.  Her daughter notes that her p.o. intake was poor and they had been encouraging her to eat better.  He denies any fever or chills.   Should be noted before the notes in or on the fifth could be completed she began having severe right upper quadrant pain elevated white count .  The day after I saw her her daughter brought her to the emergency room because of the right flank pain.  She notes she spent another 12 hours in the emergency room and "ultimately was admitted seen by general surgery and cholecystectomy was being considered but ultimately did not do it.  I found her by accident today while making rounds.  She and her daughter both confirm that she feels much better, the flank pain is gone.  On exam today her sternal incision and right infraclavicular incision  are well-healed without evidence of sternal instability or infection.      Zubrod Score: At the time of surgery this patient's most appropriate activity status/level should be described as: []     0    Normal activity, no symptoms []     1    Restricted in physical strenuous activity but ambulatory, able to do out light work []     2    Ambulatory and capable of self care, unable to do work activities, up and about                 >50 % of waking hours                                                                                   [x]     3    Only limited self care, in bed greater than 50% of waking hours []     4    Completely disabled, no self care, confined to bed or chair []     5    Moribund  Social History  Tobacco Use  Smoking Status Former Smoker  . Quit date: 12/16/1999  . Years since quitting: 20.8  Smokeless Tobacco Never Used       No Known Allergies  No current facility-administered medications for this visit.   Current Outpatient Medications  Medication Sig Dispense Refill  . acetaminophen (TYLENOL) 325 MG tablet Take 2 tablets (650 mg total) by mouth every 6 (six) hours as needed for mild pain (or Fever >/= 101).    Marland Kitchen amoxicillin-clavulanate (AUGMENTIN) 875-125 MG tablet Take 1 tablet by mouth every 12 (twelve) hours for 9 days. 18 tablet 0  . Ascorbic Acid (VITAMIN C) 100 MG tablet Take 10 tablets (1,000 mg total) by mouth daily. Takes C Complex    . bisoprolol (ZEBETA) 5 MG tablet Take 1 tablet (5 mg total) by mouth daily. 30 tablet 0  . cholecalciferol (VITAMIN D3) 25 MCG (1000 UNIT) tablet Take 5 tablets (5,000 Units total) by mouth daily.    . digoxin (LANOXIN) 0.125 MG tablet Take 0.5 tablets (0.0625 mg total) by mouth daily. 45 tablet 0  . ferrous sulfate 325 (65 FE) MG tablet Take 1 tablet (325 mg total) by mouth 2 (two) times daily with a meal. 60 tablet 2  . potassium chloride SA (KLOR-CON) 20 MEQ tablet Take 2 tablets (40 mEq total) by mouth daily. 20 tablet  0  . traMADol (ULTRAM) 50 MG tablet Take 1 tablet (50 mg total) by mouth every 12 (twelve) hours as needed. 14 tablet 0   Facility-Administered Medications Ordered in Other Visits  Medication Dose Route Frequency Provider Last Rate Last Admin  . acetaminophen (TYLENOL) tablet 650 mg  650 mg Oral Q6H PRN Marcelyn Bruins, MD   650 mg at 10/20/20 0940   Or  . acetaminophen (TYLENOL) suppository 650 mg  650 mg Rectal Q6H PRN Marcelyn Bruins, MD      . albuterol (VENTOLIN HFA) 108 (90 Base) MCG/ACT inhaler 2 puff  2 puff Inhalation Q6H PRN Marcelyn Bruins, MD      . allopurinol (ZYLOPRIM) tablet 300 mg  300 mg Oral Daily Marcelyn Bruins, MD   300 mg at 10/24/20 1013  . amoxicillin-clavulanate (AUGMENTIN) 875-125 MG per tablet 1 tablet  1 tablet Oral Q12H Nita Sells, MD   1 tablet at 10/24/20 1013  . ascorbic acid (VITAMIN C) tablet 1,000 mg  1,000 mg Oral Daily Marcelyn Bruins, MD   1,000 mg at 10/24/20 1014  . aspirin EC tablet 325 mg  325 mg Oral Daily Marcelyn Bruins, MD   325 mg at 10/24/20 1012  . bisoprolol (ZEBETA) tablet 5 mg  5 mg Oral Daily Nita Sells, MD   5 mg at 10/24/20 1014  . cholecalciferol (VITAMIN D3) tablet 5,000 Units  5,000 Units Oral Daily Marcelyn Bruins, MD   5,000 Units at 10/24/20 1013  . digoxin (LANOXIN) tablet 0.0625 mg  0.0625 mg Oral Daily Marcelyn Bruins, MD   0.0625 mg at 10/24/20 1013  . famotidine (PEPCID) tablet 10 mg  10 mg Oral Daily Nita Sells, MD   10 mg at 10/24/20 1012  . ferrous sulfate tablet 325 mg  325 mg Oral BID WC Nita Sells, MD   325 mg at 10/24/20 0743  . insulin aspart (novoLOG) injection 0-9 Units  0-9 Units Subcutaneous BID WC Nita Sells, MD   1 Units at 10/22/20 1754  . latanoprost (XALATAN) 0.005 % ophthalmic solution 1 drop  1 drop Both Eyes QHS Trilby Drummer,  Candace Gallus, MD   1 drop at 10/23/20 2119  . levothyroxine (SYNTHROID) tablet 25 mcg  25 mcg Oral q AM Marcelyn Bruins, MD   25 mcg at 10/24/20 [phone]  . potassium chloride SA (KLOR-CON) CR tablet 40 mEq  40 mEq Oral Daily Nita Sells, MD   40 mEq at 10/24/20 1014  . sodium chloride flush (NS) 0.9 % injection 3 mL  3 mL Intravenous Q12H Marcelyn Bruins, MD   3 mL at 10/24/20 1014  . traMADol (ULTRAM) tablet 50 mg  50 mg Oral Q12H Nita Sells, MD   50 mg at 10/24/20 1013       Physical Exam: BP (!) 142/74 (BP Location: Right Arm, Patient Position: Sitting)   Pulse 95   Resp 18   Ht 5' 4.5" (1.638 m)   Wt 146 lb 6.4 oz (66.4 kg)   SpO2 100% Comment: 2L O2 nasal cannula  BMI 24.74 kg/m   General appearance: alert, cooperative and Appears exhausted Neurologic: intact Heart: regular rate and rhythm, S1, S2 normal, no murmur, click, rub or gallop Lungs: diminished breath sounds bibasilar Abdomen: soft, non-tender; bowel sounds normal; no masses,  no organomegaly Extremities: extremities normal, atraumatic, no cyanosis or edema Wound: Sternal incision and right infraclavicular incisions are well-healed without evidence of infection Wounds:  Diagnostic Studies & Laboratory data:         Recent Radiology Findings: NM Hepato W/EF  Result Date: 10/22/2020 CLINICAL DATA:  Cholelithiasis.  Vomiting EXAM: NUCLEAR MEDICINE HEPATOBILIARY IMAGING WITH GALLBLADDER EF TECHNIQUE: Sequential images of the abdomen were obtained out to 60 minutes following intravenous administration of radiopharmaceutical. After oral ingestion of Ensure, gallbladder ejection fraction was determined. At 60 min, normal ejection fraction is greater than 33%. RADIOPHARMACEUTICALS:  5.1 mCi Tc-75m  Choletec IV COMPARISON:  CT 10/19/2020, ultrasound 10/19/2020 FINDINGS: Prompt clearance radiotracer from the blood pool and homogeneous uptake in liver. Counts are present in the small bowel by 20 minutes. Gallbladder begins to fill at 35 minutes. Filling defect within the central gallbladder consistent with  gallstones. At 60 minutes, a fatty meal was administered. The gallbladder failed to contracted appropriately after administration of fatty meal. Calculated gallbladder ejection fraction is 0%. Essentially no gallbladder emptying. (Normal gallbladder ejection fraction with Ensure is greater than 33%.) IMPRESSION: 1. Low gallbladder ejection fracture. Essentially no measurable gallbladder emptying. No gallbladder contraction. 2. Cholelithiasis. 3. Patent cystic duct and common bile duct Electronically Signed   By: Suzy Bouchard M.D.   On: 10/22/2020 16:21   ECHOCARDIOGRAM LIMITED  Result Date: 10/20/2020    ECHOCARDIOGRAM LIMITED REPORT   Patient Name:   FAYELYNN DISTEL Harvie Date of Exam: 10/20/2020 Medical Rec #:  [id number]           Height:       64.5 in Accession #:    [id number]          Weight:       146.4 lb Date of Birth:  1935-09-12            BSA:          1.723 m Patient Age:    85 years            BP:           116/74 mmHg Patient Gender: F                   HR:           110  bpm. Exam Location:  Inpatient Procedure: Limited Echo, Color Doppler and Cardiac Doppler Indications:     pre-operative exam  History:         Patient has prior history of Echocardiogram examinations, most                  recent 09/19/2020. Aortic dissection repair; Risk                  Factors:Hypertension, Dyslipidemia and Diabetes.  Sonographer:     Johny Chess Referring Phys:  [place] Diagnosing Phys: Ena Dawley MD IMPRESSIONS  1. Left ventricular ejection fraction, by estimation, is 55 to 60%. The left ventricle has normal function. Left ventricular diastolic parameters are consistent with Grade I diastolic dysfunction (impaired relaxation).  2. Right ventricular systolic function is moderately reduced. The right ventricular size is moderately enlarged. There is moderately elevated pulmonary artery systolic pressure. The estimated right ventricular systolic pressure is 47.8 mmHg.  3. Left atrial size was  moderately dilated.  4. Right atrial size was moderately dilated.  5. Tricuspid valve regurgitation is moderate to severe.  6. Aortic valve regurgitation is moderate. FINDINGS  Left Ventricle: Left ventricular ejection fraction, by estimation, is 55 to 60%. The left ventricle has normal function. Left ventricular diastolic parameters are consistent with Grade I diastolic dysfunction (impaired relaxation). Right Ventricle: The right ventricular size is moderately enlarged. Right ventricular systolic function is moderately reduced. There is moderately elevated pulmonary artery systolic pressure. The tricuspid regurgitant velocity is 3.46 m/s, and with an assumed right atrial pressure of 3 mmHg, the estimated right ventricular systolic pressure is 29.5 mmHg. Left Atrium: Left atrial size was moderately dilated. Right Atrium: Right atrial size was moderately dilated. Tricuspid Valve: Tricuspid valve regurgitation is moderate to severe. Aortic Valve: Aortic valve regurgitation is moderate. Aortic regurgitation PHT measures 275 msec. LEFT VENTRICLE PLAX 2D LVIDd:         3.80 cm LVIDs:         2.60 cm LV PW:         1.20 cm LV IVS:        0.90 cm  LV Volumes (MOD) LV vol d, MOD A2C: 39.1 ml LV vol s, MOD A2C: 22.7 ml LV SV MOD A2C:     16.4 ml IVC IVC diam: 1.50 cm LEFT ATRIUM         Index LA diam:    4.50 cm 2.61 cm/m  AORTIC VALVE LVOT Vmax:   158.00 cm/s LVOT Vmean:  102.000 cm/s LVOT VTI:    0.242 m AI PHT:      275 msec MR Peak grad: 119.7 mmHg    TRICUSPID VALVE MR Mean grad: 75.0 mmHg     TR Peak grad:   47.9 mmHg MR Vmax:      547.00 cm/s   TR Vmax:        346.00 cm/s MR Vmean:     391.0 cm/s MV E velocity: 69.20 cm/s   SHUNTS MV A velocity: 148.00 cm/s  Systemic VTI: 0.24 m MV E/A ratio:  0.47 Ena Dawley MD Electronically signed by Ena Dawley MD Signature Date/Time: 10/20/2020/4:38:28 PM    Final (Updated)        Recent Labs: Lab Results  Component Value Date   WBC 13.0 (H) 10/23/2020   HGB  7.3 (L) 10/23/2020   HCT 25.2 (L) 10/23/2020   PLT 321 10/23/2020   GLUCOSE 109 (H) 10/23/2020   CHOL 86 09/19/2020  TRIG 96 09/19/2020   HDL 23 (L) 09/19/2020   LDLCALC 44 09/19/2020   ALT 35 10/23/2020   AST 41 10/23/2020   NA 140 10/23/2020   K 3.1 (L) 10/23/2020   CL 106 10/23/2020   CREATININE 1.59 (H) 10/23/2020   BUN 34 (H) 10/23/2020   CO2 26 10/23/2020   TSH 4.607 (H) 09/19/2020   INR 1.0 09/14/2020   HGBA1C 5.8 (H) 09/14/2020      Assessment / Plan:   Status post repair of a sending aortic dissection-patient had evidence of significant right ventricular dysfunction on previous echoes. -   Patient is being discharged from the hospital today and plans to go to [place] with her daughter tomorrow    Medication Changes: No orders of the defined types were placed in this encounter.  [name] 10/24/2020 3:11 PM

## 2020-10-19 ENCOUNTER — Emergency Department (HOSPITAL_COMMUNITY): Payer: Medicare HMO

## 2020-10-19 ENCOUNTER — Encounter (HOSPITAL_COMMUNITY): Payer: Self-pay | Admitting: Emergency Medicine

## 2020-10-19 ENCOUNTER — Inpatient Hospital Stay (HOSPITAL_COMMUNITY)
Admission: EM | Admit: 2020-10-19 | Discharge: 2020-10-24 | DRG: 071 | Disposition: A | Discharge status: Home Health | Payer: Medicare HMO | Attending: Family Medicine | Admitting: Family Medicine

## 2020-10-19 DIAGNOSIS — K8000 Calculus of gallbladder with acute cholecystitis without obstruction: Secondary | ICD-10-CM | POA: Diagnosis not present

## 2020-10-19 DIAGNOSIS — R69 Illness, unspecified: Secondary | ICD-10-CM | POA: Diagnosis not present

## 2020-10-19 DIAGNOSIS — I10 Essential (primary) hypertension: Secondary | ICD-10-CM | POA: Diagnosis present

## 2020-10-19 DIAGNOSIS — E785 Hyperlipidemia, unspecified: Secondary | ICD-10-CM | POA: Diagnosis present

## 2020-10-19 DIAGNOSIS — Z79890 Hormone replacement therapy (postmenopausal): Secondary | ICD-10-CM

## 2020-10-19 DIAGNOSIS — G4733 Obstructive sleep apnea (adult) (pediatric): Secondary | ICD-10-CM | POA: Diagnosis present

## 2020-10-19 DIAGNOSIS — E039 Hypothyroidism, unspecified: Secondary | ICD-10-CM | POA: Diagnosis present

## 2020-10-19 DIAGNOSIS — I2721 Secondary pulmonary arterial hypertension: Secondary | ICD-10-CM | POA: Diagnosis not present

## 2020-10-19 DIAGNOSIS — I130 Hypertensive heart and chronic kidney disease with heart failure and stage 1 through stage 4 chronic kidney disease, or unspecified chronic kidney disease: Secondary | ICD-10-CM | POA: Diagnosis not present

## 2020-10-19 DIAGNOSIS — I452 Bifascicular block: Secondary | ICD-10-CM | POA: Diagnosis present

## 2020-10-19 DIAGNOSIS — Z809 Family history of malignant neoplasm, unspecified: Secondary | ICD-10-CM

## 2020-10-19 DIAGNOSIS — E119 Type 2 diabetes mellitus without complications: Secondary | ICD-10-CM

## 2020-10-19 DIAGNOSIS — Z87891 Personal history of nicotine dependence: Secondary | ICD-10-CM

## 2020-10-19 DIAGNOSIS — N281 Cyst of kidney, acquired: Secondary | ICD-10-CM | POA: Diagnosis not present

## 2020-10-19 DIAGNOSIS — I361 Nonrheumatic tricuspid (valve) insufficiency: Secondary | ICD-10-CM | POA: Diagnosis not present

## 2020-10-19 DIAGNOSIS — G459 Transient cerebral ischemic attack, unspecified: Secondary | ICD-10-CM | POA: Diagnosis present

## 2020-10-19 DIAGNOSIS — Z8673 Personal history of transient ischemic attack (TIA), and cerebral infarction without residual deficits: Secondary | ICD-10-CM

## 2020-10-19 DIAGNOSIS — I7101 Dissection of thoracic aorta: Secondary | ICD-10-CM | POA: Diagnosis not present

## 2020-10-19 DIAGNOSIS — J432 Centrilobular emphysema: Secondary | ICD-10-CM | POA: Diagnosis present

## 2020-10-19 DIAGNOSIS — R110 Nausea: Secondary | ICD-10-CM | POA: Diagnosis not present

## 2020-10-19 DIAGNOSIS — R0989 Other specified symptoms and signs involving the circulatory and respiratory systems: Secondary | ICD-10-CM | POA: Diagnosis not present

## 2020-10-19 DIAGNOSIS — I50810 Right heart failure, unspecified: Secondary | ICD-10-CM | POA: Diagnosis not present

## 2020-10-19 DIAGNOSIS — R4182 Altered mental status, unspecified: Secondary | ICD-10-CM | POA: Diagnosis not present

## 2020-10-19 DIAGNOSIS — J449 Chronic obstructive pulmonary disease, unspecified: Secondary | ICD-10-CM | POA: Diagnosis present

## 2020-10-19 DIAGNOSIS — R2689 Other abnormalities of gait and mobility: Secondary | ICD-10-CM | POA: Diagnosis not present

## 2020-10-19 DIAGNOSIS — R634 Abnormal weight loss: Secondary | ICD-10-CM | POA: Diagnosis present

## 2020-10-19 DIAGNOSIS — N179 Acute kidney failure, unspecified: Secondary | ICD-10-CM | POA: Diagnosis present

## 2020-10-19 DIAGNOSIS — E1022 Type 1 diabetes mellitus with diabetic chronic kidney disease: Secondary | ICD-10-CM | POA: Diagnosis present

## 2020-10-19 DIAGNOSIS — Z7982 Long term (current) use of aspirin: Secondary | ICD-10-CM

## 2020-10-19 DIAGNOSIS — Z794 Long term (current) use of insulin: Secondary | ICD-10-CM

## 2020-10-19 DIAGNOSIS — R1084 Generalized abdominal pain: Secondary | ICD-10-CM | POA: Diagnosis not present

## 2020-10-19 DIAGNOSIS — R6510 Systemic inflammatory response syndrome (SIRS) of non-infectious origin without acute organ dysfunction: Secondary | ICD-10-CM | POA: Diagnosis not present

## 2020-10-19 DIAGNOSIS — Z7984 Long term (current) use of oral hypoglycemic drugs: Secondary | ICD-10-CM

## 2020-10-19 DIAGNOSIS — Z8711 Personal history of peptic ulcer disease: Secondary | ICD-10-CM | POA: Diagnosis not present

## 2020-10-19 DIAGNOSIS — Z20822 Contact with and (suspected) exposure to covid-19: Secondary | ICD-10-CM | POA: Diagnosis not present

## 2020-10-19 DIAGNOSIS — R1011 Right upper quadrant pain: Secondary | ICD-10-CM | POA: Diagnosis not present

## 2020-10-19 DIAGNOSIS — E7800 Pure hypercholesterolemia, unspecified: Secondary | ICD-10-CM | POA: Diagnosis present

## 2020-10-19 DIAGNOSIS — R630 Anorexia: Secondary | ICD-10-CM | POA: Diagnosis present

## 2020-10-19 DIAGNOSIS — I50812 Chronic right heart failure: Secondary | ICD-10-CM | POA: Diagnosis present

## 2020-10-19 DIAGNOSIS — N183 Chronic kidney disease, stage 3 unspecified: Secondary | ICD-10-CM

## 2020-10-19 DIAGNOSIS — J9811 Atelectasis: Secondary | ICD-10-CM | POA: Diagnosis not present

## 2020-10-19 DIAGNOSIS — Z01810 Encounter for preprocedural cardiovascular examination: Secondary | ICD-10-CM | POA: Diagnosis not present

## 2020-10-19 DIAGNOSIS — R531 Weakness: Secondary | ICD-10-CM | POA: Diagnosis present

## 2020-10-19 DIAGNOSIS — Z79899 Other long term (current) drug therapy: Secondary | ICD-10-CM | POA: Diagnosis not present

## 2020-10-19 DIAGNOSIS — R5383 Other fatigue: Secondary | ICD-10-CM | POA: Diagnosis not present

## 2020-10-19 DIAGNOSIS — R109 Unspecified abdominal pain: Secondary | ICD-10-CM | POA: Diagnosis not present

## 2020-10-19 DIAGNOSIS — I2781 Cor pulmonale (chronic): Secondary | ICD-10-CM | POA: Diagnosis present

## 2020-10-19 DIAGNOSIS — D649 Anemia, unspecified: Secondary | ICD-10-CM | POA: Diagnosis not present

## 2020-10-19 DIAGNOSIS — I712 Thoracic aortic aneurysm, without rupture: Secondary | ICD-10-CM | POA: Diagnosis present

## 2020-10-19 DIAGNOSIS — I50811 Acute right heart failure: Secondary | ICD-10-CM | POA: Diagnosis not present

## 2020-10-19 DIAGNOSIS — K8020 Calculus of gallbladder without cholecystitis without obstruction: Secondary | ICD-10-CM | POA: Diagnosis not present

## 2020-10-19 DIAGNOSIS — R7401 Elevation of levels of liver transaminase levels: Secondary | ICD-10-CM | POA: Diagnosis present

## 2020-10-19 DIAGNOSIS — I50813 Acute on chronic right heart failure: Secondary | ICD-10-CM | POA: Diagnosis not present

## 2020-10-19 DIAGNOSIS — R410 Disorientation, unspecified: Secondary | ICD-10-CM | POA: Diagnosis not present

## 2020-10-19 DIAGNOSIS — R52 Pain, unspecified: Secondary | ICD-10-CM | POA: Diagnosis not present

## 2020-10-19 DIAGNOSIS — M109 Gout, unspecified: Secondary | ICD-10-CM | POA: Diagnosis present

## 2020-10-19 DIAGNOSIS — D62 Acute posthemorrhagic anemia: Secondary | ICD-10-CM | POA: Diagnosis present

## 2020-10-19 DIAGNOSIS — G9340 Encephalopathy, unspecified: Secondary | ICD-10-CM | POA: Diagnosis not present

## 2020-10-19 DIAGNOSIS — K56609 Unspecified intestinal obstruction, unspecified as to partial versus complete obstruction: Secondary | ICD-10-CM | POA: Diagnosis not present

## 2020-10-19 DIAGNOSIS — R5381 Other malaise: Secondary | ICD-10-CM | POA: Diagnosis not present

## 2020-10-19 DIAGNOSIS — R627 Adult failure to thrive: Secondary | ICD-10-CM | POA: Diagnosis not present

## 2020-10-19 DIAGNOSIS — Z8679 Personal history of other diseases of the circulatory system: Secondary | ICD-10-CM | POA: Diagnosis not present

## 2020-10-19 DIAGNOSIS — M79673 Pain in unspecified foot: Secondary | ICD-10-CM | POA: Diagnosis not present

## 2020-10-19 HISTORY — DX: Hypothyroidism, unspecified: E03.9

## 2020-10-19 LAB — BASIC METABOLIC PANEL
Anion gap: 12 (ref 5–15)
BUN: 24 mg/dL — ABNORMAL HIGH (ref 8–23)
CO2: 24 mmol/L (ref 22–32)
Calcium: 9.6 mg/dL (ref 8.9–10.3)
Chloride: 99 mmol/L (ref 98–111)
Creatinine, Ser: 1.86 mg/dL — ABNORMAL HIGH (ref 0.44–1.00)
GFR, Estimated: 27 mL/min — ABNORMAL LOW (ref >60)
Glucose, Bld: 153 mg/dL — ABNORMAL HIGH (ref 70–99)
Potassium: 4.5 mmol/L (ref 3.5–5.1)
Sodium: 135 mmol/L (ref 135–145)

## 2020-10-19 LAB — CBC WITH DIFFERENTIAL/PLATELET
Abs Immature Granulocytes: 0.41 10*3/uL — ABNORMAL HIGH (ref 0.00–0.07)
Basophils Absolute: 0 10*3/uL (ref 0.0–0.1)
Basophils Relative: 0 %
Eosinophils Absolute: 0 10*3/uL (ref 0.0–0.5)
Eosinophils Relative: 0 %
HCT: 24.1 % — ABNORMAL LOW (ref 36.0–46.0)
Hemoglobin: 7 g/dL — ABNORMAL LOW (ref 12.0–15.0)
Immature Granulocytes: 2 %
Lymphocytes Relative: 4 %
Lymphs Abs: 1 10*3/uL (ref 0.7–4.0)
MCH: 20.5 pg — ABNORMAL LOW (ref 26.0–34.0)
MCHC: 29 g/dL — ABNORMAL LOW (ref 30.0–36.0)
MCV: 70.7 fL — ABNORMAL LOW (ref 80.0–100.0)
Monocytes Absolute: 1.2 10*3/uL — ABNORMAL HIGH (ref 0.1–1.0)
Monocytes Relative: 5 %
Neutro Abs: 22.9 10*3/uL — ABNORMAL HIGH (ref 1.7–7.7)
Neutrophils Relative %: 89 %
Platelets: 467 10*3/uL — ABNORMAL HIGH (ref 150–400)
RBC: 3.41 MIL/uL — ABNORMAL LOW (ref 3.87–5.11)
RDW: 21.1 % — ABNORMAL HIGH (ref 11.5–15.5)
WBC: 25.5 10*3/uL — ABNORMAL HIGH (ref 4.0–10.5)
nRBC: 0.1 % (ref 0.0–0.2)

## 2020-10-19 LAB — URINALYSIS, ROUTINE W REFLEX MICROSCOPIC
Bacteria, UA: NONE SEEN
Bilirubin Urine: NEGATIVE
Glucose, UA: NEGATIVE mg/dL
Hgb urine dipstick: NEGATIVE
Ketones, ur: NEGATIVE mg/dL
Leukocytes,Ua: NEGATIVE
Nitrite: NEGATIVE
Protein, ur: 30 mg/dL — AB
Specific Gravity, Urine: 1.015 (ref 1.005–1.030)
pH: 5 (ref 5.0–8.0)

## 2020-10-19 LAB — RETICULOCYTES
Immature Retic Fract: 40.4 % — ABNORMAL HIGH (ref 2.3–15.9)
RBC.: 3.76 MIL/uL — ABNORMAL LOW (ref 3.87–5.11)
Retic Count, Absolute: 153 10*3/uL (ref 19.0–186.0)
Retic Ct Pct: 4.1 % — ABNORMAL HIGH (ref 0.4–3.1)

## 2020-10-19 LAB — CBC
HCT: 26.2 % — ABNORMAL LOW (ref 36.0–46.0)
Hemoglobin: 7.5 g/dL — ABNORMAL LOW (ref 12.0–15.0)
MCH: 20.2 pg — ABNORMAL LOW (ref 26.0–34.0)
MCHC: 28.6 g/dL — ABNORMAL LOW (ref 30.0–36.0)
MCV: 70.4 fL — ABNORMAL LOW (ref 80.0–100.0)
Platelets: 516 10*3/uL — ABNORMAL HIGH (ref 150–400)
RBC: 3.72 MIL/uL — ABNORMAL LOW (ref 3.87–5.11)
RDW: 21.1 % — ABNORMAL HIGH (ref 11.5–15.5)
WBC: 30.8 10*3/uL — ABNORMAL HIGH (ref 4.0–10.5)
nRBC: 0.2 % (ref 0.0–0.2)

## 2020-10-19 LAB — DIGOXIN LEVEL: Digoxin Level: 0.9 ng/mL — ABNORMAL LOW (ref 1.0–2.0)

## 2020-10-19 LAB — BRAIN NATRIURETIC PEPTIDE: B Natriuretic Peptide: 580.5 pg/mL — ABNORMAL HIGH (ref 0.0–100.0)

## 2020-10-19 LAB — RESP PANEL BY RT PCR (RSV, FLU A&B, COVID)
Influenza A by PCR: NEGATIVE
Influenza B by PCR: NEGATIVE
Respiratory Syncytial Virus by PCR: NEGATIVE
SARS Coronavirus 2 by RT PCR: NEGATIVE

## 2020-10-19 LAB — FOLATE: Folate: 16.8 ng/mL (ref >5.9)

## 2020-10-19 LAB — LACTIC ACID, PLASMA
Lactic Acid, Venous: 2.2 mmol/L (ref 0.5–1.9)
Lactic Acid, Venous: 1.8 mmol/L (ref 0.5–1.9)

## 2020-10-19 LAB — PREPARE RBC (CROSSMATCH)

## 2020-10-19 LAB — CBG MONITORING, ED: Glucose-Capillary: 120 mg/dL — ABNORMAL HIGH (ref 70–99)

## 2020-10-19 LAB — LIPASE, BLOOD: Lipase: 49 U/L (ref 11–51)

## 2020-10-19 LAB — POC OCCULT BLOOD, ED: Fecal Occult Bld: NEGATIVE

## 2020-10-19 MED ORDER — SODIUM CHLORIDE 0.9 % IV SOLN
2.0000 g | INTRAVENOUS | Status: DC
  Administered 2020-10-19: 2 g via INTRAVENOUS
  Filled 2020-10-19: qty 2

## 2020-10-19 MED ORDER — VITAMIN D 25 MCG (1000 UNIT) PO TABS
5000.0000 [IU] | ORAL_TABLET | Freq: Every day | ORAL | Status: DC
  Administered 2020-10-20 – 2020-10-24 (×4): 5000 [IU] via ORAL
  Filled 2020-10-19 (×4): qty 5

## 2020-10-19 MED ORDER — FAMOTIDINE 20 MG PO TABS
40.0000 mg | ORAL_TABLET | Freq: Every day | ORAL | Status: DC
  Administered 2020-10-20: 40 mg via ORAL
  Filled 2020-10-19: qty 2

## 2020-10-19 MED ORDER — VANCOMYCIN HCL IN DEXTROSE 1-5 GM/200ML-% IV SOLN: 1000.0000 mg | INTRAVENOUS | Status: DC

## 2020-10-19 MED ORDER — DIGOXIN 125 MCG PO TABS
0.0625 mg | ORAL_TABLET | Freq: Every day | ORAL | Status: DC
  Administered 2020-10-20 – 2020-10-24 (×4): 0.0625 mg via ORAL
  Filled 2020-10-19 (×4): qty 1

## 2020-10-19 MED ORDER — LATANOPROST 0.005 % OP SOLN
1.0000 [drp] | Freq: Every day | OPHTHALMIC | Status: DC
  Administered 2020-10-21 – 2020-10-23 (×3): 1 [drp] via OPHTHALMIC
  Filled 2020-10-19 (×2): qty 2.5

## 2020-10-19 MED ORDER — LACTATED RINGERS IV SOLN: INTRAVENOUS | Status: AC

## 2020-10-19 MED ORDER — ALBUTEROL SULFATE HFA 108 (90 BASE) MCG/ACT IN AERS
2.0000 | INHALATION_SPRAY | Freq: Four times a day (QID) | RESPIRATORY_TRACT | Status: DC | PRN
  Filled 2020-10-19: qty 6.7

## 2020-10-19 MED ORDER — BISOPROLOL FUMARATE 5 MG PO TABS
2.5000 mg | ORAL_TABLET | Freq: Every day | ORAL | Status: DC
  Administered 2020-10-20: 2.5 mg via ORAL
  Filled 2020-10-19: qty 1

## 2020-10-19 MED ORDER — SODIUM CHLORIDE 0.9% FLUSH
3.0000 mL | Freq: Two times a day (BID) | INTRAVENOUS | Status: DC
  Administered 2020-10-20 – 2020-10-24 (×7): 3 mL via INTRAVENOUS

## 2020-10-19 MED ORDER — METRONIDAZOLE IN NACL 5-0.79 MG/ML-% IV SOLN
500.0000 mg | Freq: Three times a day (TID) | INTRAVENOUS | Status: DC
  Administered 2020-10-19 – 2020-10-23 (×11): 500 mg via INTRAVENOUS
  Filled 2020-10-19 (×11): qty 100

## 2020-10-19 MED ORDER — ACETAMINOPHEN 325 MG PO TABS
650.0000 mg | ORAL_TABLET | Freq: Four times a day (QID) | ORAL | Status: DC | PRN
  Administered 2020-10-20: 650 mg via ORAL
  Filled 2020-10-19: qty 2

## 2020-10-19 MED ORDER — LEVOTHYROXINE SODIUM 25 MCG PO TABS
25.0000 ug | ORAL_TABLET | Freq: Every morning | ORAL | Status: DC
  Administered 2020-10-20 – 2020-10-24 (×5): 25 ug via ORAL
  Filled 2020-10-19 (×5): qty 1

## 2020-10-19 MED ORDER — ASCORBIC ACID 500 MG PO TABS
1000.0000 mg | ORAL_TABLET | Freq: Every day | ORAL | Status: DC
  Administered 2020-10-20 – 2020-10-24 (×4): 1000 mg via ORAL
  Filled 2020-10-19 (×5): qty 2

## 2020-10-19 MED ORDER — SODIUM CHLORIDE 0.9 % IV BOLUS: 1000.0000 mL | Freq: Once | INTRAVENOUS | Status: DC

## 2020-10-19 MED ORDER — FENTANYL CITRATE (PF) 100 MCG/2ML IJ SOLN
50.0000 ug | Freq: Once | INTRAMUSCULAR | Status: AC
  Administered 2020-10-19: 50 ug via INTRAVENOUS
  Filled 2020-10-19: qty 2

## 2020-10-19 MED ORDER — SODIUM CHLORIDE 0.9 % IV BOLUS
500.0000 mL | Freq: Once | INTRAVENOUS | Status: AC
  Administered 2020-10-19: 500 mL via INTRAVENOUS

## 2020-10-19 MED ORDER — SIMVASTATIN 20 MG PO TABS
40.0000 mg | ORAL_TABLET | Freq: Every day | ORAL | Status: DC
  Administered 2020-10-20: 40 mg via ORAL
  Filled 2020-10-19: qty 2

## 2020-10-19 MED ORDER — INSULIN ASPART 100 UNIT/ML ~~LOC~~ SOLN
0.0000 [IU] | SUBCUTANEOUS | Status: DC
  Administered 2020-10-20: 1 [IU] via SUBCUTANEOUS

## 2020-10-19 MED ORDER — SODIUM CHLORIDE 0.9% IV SOLUTION: Freq: Once | INTRAVENOUS | Status: AC

## 2020-10-19 MED ORDER — ASPIRIN EC 325 MG PO TBEC
325.0000 mg | DELAYED_RELEASE_TABLET | Freq: Every day | ORAL | Status: DC
  Administered 2020-10-20 – 2020-10-24 (×4): 325 mg via ORAL
  Filled 2020-10-19 (×4): qty 1

## 2020-10-19 MED ORDER — ACETAMINOPHEN 650 MG RE SUPP: 650.0000 mg | Freq: Four times a day (QID) | RECTAL | Status: DC | PRN

## 2020-10-19 MED ORDER — VANCOMYCIN HCL IN DEXTROSE 1-5 GM/200ML-% IV SOLN
1000.0000 mg | Freq: Once | INTRAVENOUS | Status: AC
  Administered 2020-10-19: 1000 mg via INTRAVENOUS
  Filled 2020-10-19: qty 200

## 2020-10-19 MED ORDER — ALLOPURINOL 300 MG PO TABS
300.0000 mg | ORAL_TABLET | Freq: Every day | ORAL | Status: DC
  Administered 2020-10-20 – 2020-10-24 (×4): 300 mg via ORAL
  Filled 2020-10-19 (×4): qty 1

## 2020-10-19 MED ORDER — SODIUM CHLORIDE 0.9 % IV BOLUS: 500.0000 mL | Freq: Once | INTRAVENOUS | Status: DC

## 2020-10-19 MED ORDER — PIPERACILLIN-TAZOBACTAM 3.375 G IVPB 30 MIN
3.3750 g | Freq: Once | INTRAVENOUS | Status: AC
  Administered 2020-10-19: 3.375 g via INTRAVENOUS
  Filled 2020-10-19: qty 50

## 2020-10-19 NOTE — ED Notes (Signed)
Pt alert talking .  Family at  The bedside

## 2020-10-19 NOTE — H&P (Signed)
History and Physical   Danielle Atkins [facility] HTD:[id number] DOB: 21-Sep-1935 DOA: 10/19/2020  PCP: [name], [name], MD   Patient coming from: [facility] skilled nursing facility  Chief Complaint: Confusion, weakness, abdominal pain.  HPI: Danielle Atkins is a 85 y.o. female with medical history significant of anemia, aortic aneurysm, aortic dissection status post repair last month, CKD 3, diabetes, hypertension, hyperlipidemia, PUD, right heart failure, TIA who presents with at least 2 days of decrease in activity, confusion, weakness, right abdominal pain, and decreased p.o. intake.  Patient unable to significantly participate in HPI given her confusion.  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  Patient's Danielle had not seen her in a few days and noticed the symptoms as listed above.  She states that one of the patient's friends have been visiting her and noticed that the patient had been less interactive and sleeping more frequently.  Of note, patient was admitted from 08/3209/23 for aortic dissection and repair.   ED Course: Vitals in the ED significant for tachypnea in the 20s to low 30s and intermittent soft blood pressure in the 726O systolic.  Lab work significant for creatinine of 1.86 up from baseline of 1.2, hemoglobin of 7.5 down from baseline of 9.2, leukocytosis to 30.  Also noted was a lactic acid of 2.2, mildly low digoxin level at 0.9, BNP of 580.  Respiratory panel is negative.  UA and urine culture pending.  Chest x-ray showed stable changes.  CT head showed no acute changes.  CT abdomen showed mild dilated cecum and transverse colon with stool and air but no clear transition point.  Cholelith's also noted.  Patient received dose of broad-spectrum antibiotics and 500 cc bolus.  Review of Systems: As per HPI otherwise all other systems reviewed and are  negative.  Past Medical History:  Diagnosis Date  . ANEMIA-NOS 06/25/2007  . Aortic aneurysm ([place]) 09/13/2020  . Aortic dissection ([place]) 09/14/2020  . CKD (chronic kidney disease) stage 3, GFR 30-59 ml/min (HCC) 09/01/2019  . CKD (chronic kidney disease), stage III ([place])   . Diabetes mellitus ([place])   . Diabetes mellitus with renal complications ([place]) 0/02/5596  . Disorder resulting from impaired renal function 06/25/2007   Qualifier: Diagnosis of  By: Paulina Fusi RN, Daine Gravel   . Essential hypertension 06/25/2007   Qualifier: Diagnosis of  By: Paulina Fusi RN, Daine Gravel   . Former tobacco use   . GOUT 11/16/2009  . History of cardiovascular disorder 06/25/2007   Qualifier: Diagnosis of  By: Paulina Fusi, RN, Daine Gravel   . HTN (hypertension)   . Hyperlipidemia   . Microcytic anemia 06/25/2007   Qualifier: Diagnosis of  By: Paulina Fusi RN, Daine Gravel   . PARESTHESIA, HANDS 03/27/2009  . PEPTIC ULCER DISEASE 06/25/2007  . Piriformis syndrome, left 09/29/2019  . PUD (peptic ulcer disease) 06/25/2007   Qualifier: Diagnosis of  By: Paulina Fusi RN, Daine Gravel   . Pure hypercholesterolemia 06/25/2007   Qualifier: Diagnosis of  By: Paulina Fusi RN, Daine Gravel   . RBBB   . Right heart failure ([employer]) 09/20/2020  . TIA (transient ischemic attack)   . Upper GI bleed     Past Surgical History:  Procedure Laterality Date  . RIGHT HEART CATH N/A 09/27/2020   Procedure: RIGHT HEART CATH;  Surgeon: Larey Dresser, MD;  Location: [facility];  Service: Cardiovascular;  Laterality: N/A;  .  THORACIC AORTIC ANEURYSM REPAIR N/A 09/13/2020   Procedure: TYPE 1 AORTIC DISSECTION WITH REPLACEMENT OF ASCENDING AORTA WITH 32MM HEMASHIELD GRAFT, RESUSPENSION OF AORTIC VALVE AND HYPOTHERMIC  CIRCULATORY ARRST WITH CEREBRAL PREFUSION  ;  Surgeon: [name], MD;  Location: [facility];  Service: Open Heart Surgery;  Laterality: N/A;    Social History  reports that she quit smoking about 20 years ago. She has never used smokeless tobacco. She reports  that she does not drink alcohol and does not use drugs.  No Known Allergies  Family History  Problem Relation Age of Onset  . Cancer Mother   Reviewed on admission  Prior to Admission medications   Medication Sig Start Date End Date Taking? Authorizing Provider  albuterol (PROAIR HFA) 108 (90 Base) MCG/ACT inhaler Inhale 2 puffs into the lungs every 6 (six) hours as needed for wheezing. 12/28/18   [name], MD  allopurinol (ZYLOPRIM) 300 MG tablet Take 1 tablet (300 mg total) by mouth daily. 10/05/20   Gold, Wilder Glade, PA-C  Ascorbic Acid (VITAMIN C) 100 MG tablet Take 1,000 mg by mouth daily. Takes C Complex    [provider]  aspirin EC 325 MG EC tablet Take 1 tablet (325 mg total) by mouth daily. 10/05/20   Gold, Wilder Glade, PA-C  bisoprolol (ZEBETA) 5 MG tablet Take 0.5 tablets (2.5 mg total) by mouth daily. 10/05/20   John Giovanni, PA-C  cholecalciferol (VITAMIN D3) 25 MCG (1000 UT) tablet Take 5,000 Units by mouth daily.     [provider]  digoxin (LANOXIN) 0.125 MG tablet Take 0.5 tablets (0.0625 mg total) by mouth daily. 10/11/20   Lyda Jester M, PA-C  famotidine (PEPCID) 40 MG tablet Take 1 tablet (40 mg total) by mouth daily. 10/05/20   Gold, Wilder Glade, PA-C  furosemide (LASIX) 40 MG tablet Take 1 tablet (40 mg total) by mouth daily AND 0.5 tablets (20 mg total) daily. Alternate days between 40mg  and 20mg . 10/11/20   Lyda Jester M, PA-C  latanoprost (XALATAN) 0.005 % ophthalmic solution 1 drop daily. 06/28/20   [provider]  levothyroxine (SYNTHROID) 25 MCG tablet Take 1 tablet (25 mcg total) by mouth in the morning. 10/06/20   Gold, Wilder Glade, PA-C  metFORMIN (GLUCOPHAGE) 500 MG tablet Take 1 tablet (500 mg total) by mouth daily with breakfast. 10/05/20   Gold, Patrick Jupiter E, PA-C  potassium chloride SA (KLOR-CON) 20 MEQ tablet Take 1 tablet (20 mEq total) by mouth daily AND 0.5 tablets (10 mEq total) daily. Alternate days between 16meq and  33meq. 10/11/20   Lyda Jester M, PA-C  simvastatin (ZOCOR) 40 MG tablet Take 1 tablet (40 mg total) by mouth daily. 10/05/20   John Giovanni, PA-C    Physical Exam: Vitals:   10/19/20 2030 10/19/20 2045 10/19/20 2100 10/19/20 2115  BP: 133/65 118/65 (!) 101/52 (!) 110/57  Pulse: 96 93 92 88  Resp: (!) 22 (!) 30 (!) 36 (!) 30  Temp:      TempSrc:      SpO2: 99% 98% 97% 99%   Physical Exam Constitutional:      Comments: Ill-appearing elderly female  HENT:     Head: Normocephalic and atraumatic.     Mouth/Throat:     Mouth: Mucous membranes are moist.     Pharynx: Oropharynx is clear.  Eyes:     Extraocular Movements: Extraocular movements intact.     Pupils: Pupils are equal, round, and reactive to light.  Cardiovascular:     Rate and Rhythm: Normal rate and regular rhythm.     Pulses: Normal pulses.     Heart sounds: Normal heart sounds.  Pulmonary:     Effort: Pulmonary effort is normal. No respiratory distress.     Breath sounds: Normal breath sounds.  Abdominal:     General: Bowel sounds are normal. There is no distension.     Palpations: Abdomen is soft.     Tenderness: There is abdominal tenderness (Mild tenderness right abdomen).  Musculoskeletal:        General: No swelling or deformity.  Skin:    General: Skin is warm and dry.  Neurological:     General: No focal deficit present.     Comments: Alert and oriented x3    Labs on Admission: I have personally reviewed following labs and imaging studies  CBC: Recent Labs  Lab 10/19/20 1300 10/19/20 2010  WBC 30.8* 25.5*  NEUTROABS  --  22.9*  HGB 7.5* 7.0*  HCT 26.2* 24.1*  MCV 70.4* 70.7*  PLT 516* 467*    Basic Metabolic Panel: Recent Labs  Lab 10/19/20 1300  NA 135  K 4.5  CL 99  CO2 24  GLUCOSE 153*  BUN 24*  CREATININE 1.86*  CALCIUM 9.6    GFR: Estimated Creatinine Clearance: 20.2 mL/min (A) (by C-G formula based on SCr of 1.86 mg/dL (H)).  Liver Function Tests: No results  for input(s): AST, ALT, ALKPHOS, BILITOT, PROT, ALBUMIN in the last 168 hours.  Urine analysis:    Component Value Date/Time   COLORURINE YELLOW 10/19/2020 1941   APPEARANCEUR HAZY (A) 10/19/2020 1941   LABSPEC 1.015 10/19/2020 1941   PHURINE 5.0 10/19/2020 1941   GLUCOSEU NEGATIVE 10/19/2020 1941   HGBUR NEGATIVE 10/19/2020 1941   BILIRUBINUR NEGATIVE 10/19/2020 1941   BILIRUBINUR n 10/01/2016 0932   KETONESUR NEGATIVE 10/19/2020 1941   PROTEINUR 30 (A) 10/19/2020 1941   UROBILINOGEN 1.0 09/10/2020 1544   NITRITE NEGATIVE 10/19/2020 1941   LEUKOCYTESUR NEGATIVE 10/19/2020 1941    Radiological Exams on Admission: DG Chest 2 View  Result Date: 10/18/2020 CLINICAL DATA:  Aortic dissection. Aortic dissection diagnosed 09/13/2020. Shortness of breath and weakness. EXAM: CHEST - 2 VIEW COMPARISON:  Most recent chest radiograph 10/04/2020. FINDINGS: Median sternotomy similar cardiomegaly. Unchanged mediastinal contours with aortic tortuosity. Improvement in left lung aeration from prior exam with mild residual patchy opacity. Minimal fluid in the fissures without large subpulmonic effusion. Vascular congestion with possible pulmonary edema and Kerley B-lines at the bases. No pneumothorax. IMPRESSION: 1. Improving left lung aeration from prior exam, with mild residual patchy opacity, likely atelectasis. 2. Cardiomegaly with vascular congestion and possible pulmonary edema. 3. Stable mediastinal contours post aortic dissection repair. Electronically Signed   By: Keith Rake M.D.   On: 10/18/2020 16:39   CT Head Wo Contrast  Result Date: 10/19/2020 CLINICAL DATA:  Delirium. EXAM: CT HEAD WITHOUT CONTRAST TECHNIQUE: Contiguous axial images were obtained from the base of the skull through the vertex without intravenous contrast. COMPARISON:  CT head 09/13/2020, CT head 07/30/2003. FINDINGS: Brain: Cerebral ventricle sizes are concordant with the degree of cerebral volume loss. Patchy and  confluent areas of decreased attenuation are noted throughout the deep and periventricular white matter of the cerebral hemispheres bilaterally, compatible with chronic microvascular ischemic disease. No evidence of large-territorial acute infarction. No parenchymal hemorrhage. No mass lesion. No extra-axial collection. No mass effect or midline shift. No hydrocephalus. Basilar cisterns are patent. Vascular: No  hyperdense vessel. Atherosclerotic calcifications are present within the cavernous internal carotid arteries. Skull: No acute fracture or focal lesion.Hyperostosis frontalis interna. Sinuses/Orbits: Right maxillary mucosal thickening. Left ethmoid sinus osteoma versus inspissated mucus similar to prior. Otherwise paranasal and mastoid air cells are clear. Bilateral lens replacement. Otherwise the orbits are unremarkable. Other: Dental hardware. IMPRESSION: No acute intracranial abnormality in a patient with microvascular ischemic changes. Electronically Signed   By: Iven Finn M.D.   On: 10/19/2020 18:25   DG Chest Port 1 View  Result Date: 10/19/2020 CLINICAL DATA:  Altered mental status EXAM: PORTABLE CHEST 1 VIEW COMPARISON:  10/18/2020 FINDINGS: Cardiac shadow is enlarged but stable. Postsurgical changes are noted. Tortuosity of the thoracic aorta is noted. The lungs are well aerated bilaterally. Mild basilar atelectasis on the left is seen. No new focal abnormality is noted. Previously seen vascular congestion has resolved. IMPRESSION: Left basilar atelectasis. Improved vascular congestion. Electronically Signed   By: Inez Catalina M.D.   On: 10/19/2020 15:36   CT Renal Stone Study  Result Date: 10/19/2020 CLINICAL DATA:  Bilateral flank pain EXAM: CT ABDOMEN AND PELVIS WITHOUT CONTRAST TECHNIQUE: Multidetector CT imaging of the abdomen and pelvis was performed following the standard protocol without IV contrast. COMPARISON:  September 13, 2020 FINDINGS: Lower chest: There is unchanged  cardiomegaly with aortic knob calcifications. A tortuous descending aorta with basilar patchy airspace opacity at the posterior left lung base. No hiatal hernia. Hepatobiliary: Although limited due to the lack of intravenous contrast, normal in appearance without gross focal abnormality. Layering calcified gallstones are present. Pancreas:  Unremarkable.  No surrounding inflammatory changes. Spleen: Normal in size. Although limited due to the lack of intravenous contrast, normal in appearance. Adrenals/Urinary Tract: Both adrenal glands appear normal. The kidneys and collecting system appear normal without evidence of urinary tract calculus or hydronephrosis. Bladder is unremarkable. Stomach/Bowel: The stomach and small bowel. There is a moderate amount stool throughout. There is mildly air and stool-filled distended cecum and transverse colon measuring up to 7.4 cm. No focal transition point or wall thickening is noted. Scattered colonic diverticula are noted. Vascular/Lymphatic: There are no enlarged abdominal or pelvic lymph nodes. Scattered aortic atherosclerosis with a tortuous appearance is. Reproductive: The patient is status post hysterectomy. No adnexal masses or collections seen. Other: No evidence of abdominal wall mass or hernia. Musculoskeletal: No acute or significant osseous findings. IMPRESSION: Mildly dilated stool and air-filled cecum and transverse colon which could be due to ileus or partial small bowel obstruction. No focal transition point or wall thickening. Cholelithiasis Aortic Atherosclerosis (ICD10-I70.0). Electronically Signed   By: Prudencio Pair M.D.   On: 10/19/2020 18:02   US Abdomen Limited RUQ (LIVER/GB)  Result Date: 10/19/2020 CLINICAL DATA:  Right side abdominal pain EXAM: ULTRASOUND ABDOMEN LIMITED RIGHT UPPER QUADRANT COMPARISON:  02/29/2020 FINDINGS: Gallbladder: Multiple stones within the gallbladder. 9 mm stone in the region of the gallbladder neck is non mobile.  Gallbladder wall normal thickness at 2.6 mm. No pericholecystic fluid. Common bile duct: Diameter: Normal caliber, 3 mm. Liver: Heterogeneous echotexture. No mass or biliary ductal dilatation. Portal vein is patent on color Doppler imaging with normal direction of blood flow towards the liver. Other: None. IMPRESSION: Cholelithiasis. 9 mm gallstone non mobile in the gallbladder neck region. No sonographic evidence of acute cholecystitis. Electronically Signed   By: Rolm Baptise M.D.   On: 10/19/2020 20:20    EKG: Not yet obtained  Assessment/Plan Principal Problem:   ANEMIA-NOS Active Problems:   Essential  hypertension   Right heart failure (HCC)   Diabetes mellitus ([place])   TIA (transient ischemic attack)   Encephalopathy acute   Acute renal failure superimposed on stage 3 chronic kidney disease (HCC)   SIRS (systemic inflammatory response syndrome) (HCC)  Acute encephalopathy > In the setting of flank pain, decreased p.o. intake for the past couple of days > Patient's orientation improving when seen by EMS and in ED, but not back to baseline > Possibly secondary to infection with unknown source versus constipation/early SBO - We will replete fluids gently given history of right heart failure - Hold Lasix and other antihypertensives - Cover with broad-spectrum antibiotics for now while we are searching for source - N.p.o. and monitor for bowel movements, consider laxatives  Early SBO > Dilated bowel at cecum and transverse colon with stool in air. -N.p.o. for now  Leukocytosis SIRS positive without source > WBC elevated to 30, adding on differential; Tachypnea, intermitted tachycardia, LA 2.2 > No clear source identified on chest x-ray, urinalysis, abdominal CT, right upper quadrant ultrasound > No nuchal rigidity >?  Leukocytosis reactive secondary to decreased p.o. intake and early SBO - We will continue to cover with broad-spectrum antibiotics with vancomycin, but will replace  Zosyn with cefepime and Flagyl given AKI - Monitor fever curve and white count - Initial lactic acid 2.2, 1.8 on repeat which is reassuring  Anemia > Hemoglobin noted to be 7.5 down from baseline of around 9 > Hemoccult in ED negative > No hematemesis noted on CT abdomen no frank bleeding > Of note, patient does have CKD - Will repeat CBC to monitor for rate of drop in hemoglobin - If continued drop in hemoglobin will consider CT chest given history of recent aortic dissection repair, though this dissection had spread to her abdomen and no changes were noted on abdominal CT - Iron studies, B12, folate - Given heart failure will transfuse 1 unit for goal hemoglobin of greater than 8 once patient is typed and screened - Posttransfusion CBC, likely in a.m.  Acute kidney injury on CKD 3 > Creatinine 1.86 from baseline of 1.2.  Status post 500 cc bolus in ED. > Repleting fluids gently given history of right heart failure - Continue gentle IV fluids at 75 cc an hour - Can bolus if needed for soft blood pressure - Avoid nephrotoxic agents - Follow renal function  Hypertension Chronic right heart failure > Dry to euvolemic on exam - Continue digoxin, level mildly low at 0.9 - Hold Lasix in the setting of soft blood pressure and decreased p.o. intake - Gentle fluids as above - Holding home antihypertensives  Diabetes - Sensitive SSI  DVT prophylaxis: SCDs Code Status:   Full  Family Communication:  Discussed with Danielle, Janett Atkins, at bedside.  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  Disposition Plan:   Patient is from:  [facility] facility  Anticipated DC to:  [facility]  Anticipated DC date:  Pending clinical course    Consults called:  None  Admission status:  Inpatient, telemetry  Severity of Illness: The appropriate patient status for this patient is INPATIENT. Inpatient status is judged to be reasonable and necessary in  order to provide the required intensity of service to ensure the patient's safety. The patient's presenting symptoms, physical exam findings, and initial radiographic and laboratory data in the context of their chronic comorbidities is felt to place them at high risk for further clinical deterioration. Furthermore, it  is not anticipated that the patient will be medically stable for discharge from the hospital within 2 midnights of admission. The following factors support the patient status of inpatient.   " The patient's presenting symptoms include confusion, abdominal pain, constipation. " The worrisome physical exam findings include abdominal pain, soft blood pressure, confusion. " The initial radiographic and laboratory data are worrisome because of leukocytosis, anemia, constipation/early SBO. " The chronic co-morbidities include history of aortic dissection, CKD 3, diabetes, hypertension, hyperlipidemia, right heart failure.  * I certify that at the point of admission it is my clinical judgment that the patient will require inpatient hospital care spanning beyond 2 midnights from the point of admission due to high intensity of service, high risk for further deterioration and high frequency of surveillance required.Marcelyn Bruins MD [facility]  How to contact the [facility] Attending or Consulting provider [place] or covering provider during after hours [place], for this patient?   1. Check the care team in [facility] and look for a) attending/consulting TR[facility] provider listed and b) the [facility] team listed 2. Log into [email or web address] and use [place]'s universal password to access. If you do not have the password, please contact the hospital operator. 3. Locate the [facility] provider you are looking for under [facility] and page to a number that you can be directly reached. 4. If you still have difficulty reaching the provider, please page the [facility] (Director on Call) for the Hospitalists  listed on amion for assistance.  10/19/2020, 10:07 PM

## 2020-10-19 NOTE — ED Notes (Signed)
Pt family member is refusing a second IV to be placed for blood to run through. Family member is wanting for the antibiotics to finish before starting blood. IM Intern paged.

## 2020-10-19 NOTE — Progress Notes (Signed)
Pharmacy Antibiotic Note  Danielle Atkins is a 85 y.o. female admitted on 10/19/2020 with infection suspected, unknown source.  Pharmacy has been consulted for vancomycin and cefepime dosing.  Presented with confusion, weakness, and abdominal pain from SNF. WBC 30.8>25.5. LA 2.2>1.8. Scr 1.86 (CrCl 20 mL/min). Afebrile. Received 1 g IV vancomycin and zosyn in ED.   Plan: Cefepime 2g IV every 24 hours Vancomycin 1000 mg IV every 48 hours  Monitor clinical pic, cx results, Scr trend, and vanc level as appropriate    Temp (24hrs), Avg:97.8 F (36.6 C), Min:97.8 F (36.6 C), Max:97.8 F (36.6 C)  Recent Labs  Lab 10/19/20 1300 10/19/20 1522 10/19/20 2010  WBC 30.8*  --  25.5*  CREATININE 1.86*  --   --   LATICACIDVEN  --  2.2* 1.8    Estimated Creatinine Clearance: 20.2 mL/min (A) (by C-G formula based on SCr of 1.86 mg/dL (H)).    No Known Allergies  Antimicrobials this admission: Vancomycin 11/5 >>  Zosyn 11/5 x1 Cefepime 11/5 >>  Dose adjustments this admission: N/A  Microbiology results: 11/5 BCx: sent 11/5 UCx: sent  11/5 Resp PCR: neg  Thank you for allowing pharmacy to be a part of this patient's care.  Antonietta Jewel, PharmD, [place] Clinical Pharmacist  Phone: [phone] 10/19/2020 10:13 PM  Please check AMION for all [place] phone numbers After 10:00 PM, call [place] [phone]

## 2020-10-19 NOTE — ED Triage Notes (Addendum)
Pt arrives via [facility] from [place], c/o R flank pain x2 days with poor oral intake. Pt recently discharged from hospital for repair of aortic dissection at the end of september. Family advised they thought pt seemed disoriented, however, pt able to answer orientation questions appropriately for EMS. Pt on 3L O2 at baseline for hx of CHF. EMS VSS 120/74, HR 104, 96% on 3L, cbg 170. 97.9 temp.

## 2020-10-19 NOTE — ED Provider Notes (Signed)
Care transferred to me.  Patient has impressive WBC, AKI, and mild lactic acid elevation.  Otherwise, the patient's has been stable.  CT does not show an obvious acute process causing sepsis.  I did discuss the findings with Dr. Dema Severin but at this point unless the ultrasound shows cholecystitis there is nothing for surgery to do.  Possible early obstruction but unclear if this is symptomatic or causing her symptoms.  Rectal exam performed because of anemia that is new on her chronic anemia and there is no melena or gross blood.  Dr. Trilby Drummer to admit.   Sherwood Gambler, MD 10/19/20 2040

## 2020-10-19 NOTE — ED Notes (Signed)
Lab will add on lipase, iron, ferritin, B12, folte, and reticulocytes.

## 2020-10-19 NOTE — ED Provider Notes (Signed)
[facility] EMERGENCY DEPARTMENT Provider Note   CSN: [id number] Arrival date & time: 10/19/20  1233     History Chief Complaint  Patient presents with   Flank Pain    Danielle Atkins is a 85 y.o. female.  HPI  She is here with her daughter who assists with the history. Patient herself answers some questions, but is withdrawn, hesitant historian. Patient has multiple medical issues including CKD, admission earlier this year for aortic dissection with repair.  Daughter notes that during that procedure patient lost 1 kidney. Patient has been in a rehabilitation facility, but now for the past week has been accompanied by her daughter.  Daughter notes that over the week patient has declined substantially, with worsening anorexia, weakness, interactivity, and confusion starting today.  No report of fever, cough, diarrhea. No report of vomiting. Patient has developed pain in her right lateral abdomen.  Exact onset unclear, but has seemingly been present for least the past days.  Per EMS the patient required 3 L nasal cannula, which is her baseline for appropriate saturation. Daughter and patient are unsure of any recent medication changes.  Patient saw her cardiothoracic surgeon yesterday for follow-up.  Reportedly that visit was unremarkable.  Past Medical History:  Diagnosis Date   ANEMIA-NOS 06/25/2007   Aortic aneurysm (HCC) 09/13/2020   Aortic dissection ([place]) 09/14/2020   CKD (chronic kidney disease) stage 3, GFR 30-59 ml/min ([place]) 09/01/2019   CKD (chronic kidney disease), stage III (HCC)    Diabetes mellitus ([place])    Diabetes mellitus with renal complications ([place]) 06/18/1024   Disorder resulting from impaired renal function 06/25/2007   Qualifier: Diagnosis of  By: Teresa Coombs    Essential hypertension 06/25/2007   Qualifier: Diagnosis of  By: Paulina Fusi RN, Daine Gravel    Former tobacco use    GOUT 11/16/2009   History of cardiovascular  disorder 06/25/2007   Qualifier: Diagnosis of  By: Paulina Fusi RN, Daine Gravel    HTN (hypertension)    Hyperlipidemia    Microcytic anemia 06/25/2007   Qualifier: Diagnosis of  By: Paulina Fusi RN, Daine Gravel    PARESTHESIA, HANDS 03/27/2009   PEPTIC ULCER DISEASE 06/25/2007   Piriformis syndrome, left 09/29/2019   PUD (peptic ulcer disease) 06/25/2007   Qualifier: Diagnosis of  By: Paulina Fusi RN, Daine Gravel    Pure hypercholesterolemia 06/25/2007   Qualifier: Diagnosis of  By: Paulina Fusi RN, Daine Gravel    RBBB    Right heart failure ([place]) 09/20/2020   TIA (transient ischemic attack)    Upper GI bleed     Patient Active Problem List   Diagnosis Date Noted   CKD (chronic kidney disease), stage III ([place])    Diabetes mellitus ([place])    Former tobacco use    HTN (hypertension)    Hyperlipidemia    RBBB    TIA (transient ischemic attack)    Upper GI bleed    Right heart failure ([place]) 09/20/2020   Aortic dissection ([place]) 09/14/2020   Aortic aneurysm ([place]) 09/13/2020   Piriformis syndrome, left 09/29/2019   CKD (chronic kidney disease) stage 3, GFR 30-59 ml/min ([place]) 09/01/2019   Diabetes mellitus with renal complications ([place]) 85/27/7824   GOUT 11/16/2009   PARESTHESIA, HANDS 03/27/2009   Pure hypercholesterolemia 06/25/2007   Microcytic anemia 06/25/2007   Essential hypertension 06/25/2007   PUD (peptic ulcer disease) 06/25/2007   Disorder resulting from impaired renal function 06/25/2007   History of cardiovascular disorder 06/25/2007   ANEMIA-NOS  06/25/2007    Past Surgical History:  Procedure Laterality Date   RIGHT HEART CATH N/A 09/27/2020   Procedure: RIGHT HEART CATH;  Surgeon: Larey Dresser, MD;  Location: [facility];  Service: Cardiovascular;  Laterality: N/A;   THORACIC AORTIC ANEURYSM REPAIR N/A 09/13/2020   Procedure: TYPE 1 AORTIC DISSECTION WITH REPLACEMENT OF ASCENDING AORTA WITH 32MM HEMASHIELD GRAFT, RESUSPENSION OF AORTIC VALVE AND  HYPOTHERMIC  CIRCULATORY ARRST WITH CEREBRAL PREFUSION  ;  Surgeon: [name], MD;  Location: [facility];  Service: Open Heart Surgery;  Laterality: N/A;     OB History   No obstetric history on file.     Family History  Problem Relation Age of Onset   Cancer Mother     Social History   Tobacco Use   Smoking status: Former Smoker    Quit date: 12/16/1999    Years since quitting: 20.8   Smokeless tobacco: Never Used  Scientific laboratory technician Use: Never used  Substance Use Topics   Alcohol use: No   Drug use: No    Home Medications Prior to Admission medications   Medication Sig Start Date End Date Taking? Authorizing Provider  albuterol (PROAIR HFA) 108 (90 Base) MCG/ACT inhaler Inhale 2 puffs into the lungs every 6 (six) hours as needed for wheezing. 12/28/18   [name], MD  allopurinol (ZYLOPRIM) 300 MG tablet Take 1 tablet (300 mg total) by mouth daily. 10/05/20   Gold, Wilder Glade, PA-C  Ascorbic Acid (VITAMIN C) 100 MG tablet Take 1,000 mg by mouth daily. Takes C Complex    [provider]  aspirin EC 325 MG EC tablet Take 1 tablet (325 mg total) by mouth daily. 10/05/20   Gold, Wilder Glade, PA-C  bisoprolol (ZEBETA) 5 MG tablet Take 0.5 tablets (2.5 mg total) by mouth daily. 10/05/20   John Giovanni, PA-C  cholecalciferol (VITAMIN D3) 25 MCG (1000 UT) tablet Take 5,000 Units by mouth daily.     [provider]  digoxin (LANOXIN) 0.125 MG tablet Take 0.5 tablets (0.0625 mg total) by mouth daily. 10/11/20   Lyda Jester M, PA-C  famotidine (PEPCID) 40 MG tablet Take 1 tablet (40 mg total) by mouth daily. 10/05/20   Gold, Wilder Glade, PA-C  furosemide (LASIX) 40 MG tablet Take 1 tablet (40 mg total) by mouth daily AND 0.5 tablets (20 mg total) daily. Alternate days between 40mg  and 20mg . 10/11/20   Lyda Jester M, PA-C  latanoprost (XALATAN) 0.005 % ophthalmic solution 1 drop daily. 06/28/20   [provider]  levothyroxine (SYNTHROID)  25 MCG tablet Take 1 tablet (25 mcg total) by mouth in the morning. 10/06/20   Gold, Wilder Glade, PA-C  metFORMIN (GLUCOPHAGE) 500 MG tablet Take 1 tablet (500 mg total) by mouth daily with breakfast. 10/05/20   Gold, Patrick Jupiter E, PA-C  potassium chloride SA (KLOR-CON) 20 MEQ tablet Take 1 tablet (20 mEq total) by mouth daily AND 0.5 tablets (10 mEq total) daily. Alternate days between 23meq and 27meq. 10/11/20   Lyda Jester M, PA-C  simvastatin (ZOCOR) 40 MG tablet Take 1 tablet (40 mg total) by mouth daily. 10/05/20   John Giovanni, PA-C    Allergies    Patient has no known allergies.  Review of Systems   Review of Systems  Constitutional:       Per HPI, otherwise negative  HENT:       Per HPI, otherwise negative  Respiratory:  Per HPI, otherwise negative  Cardiovascular:       Per HPI, otherwise negative  Gastrointestinal: Negative for vomiting.  Endocrine:       Negative aside from HPI  Genitourinary:       Neg aside from HPI   Musculoskeletal:       Per HPI, otherwise negative  Skin: Negative.   Neurological: Positive for weakness. Negative for syncope.    Physical Exam Updated Vital Signs BP 112/66 (BP Location: Right Arm)    Pulse (!) 105    Temp 97.8 F (36.6 C) (Oral)    Resp 20    SpO2 97%   Physical Exam Vitals and nursing note reviewed.  Constitutional:      General: She is not in acute distress.    Appearance: She is well-developed. She is ill-appearing. She is not toxic-appearing.  HENT:     Head: Normocephalic and atraumatic.  Eyes:     Conjunctiva/sclera: Conjunctivae normal.  Cardiovascular:     Rate and Rhythm: Regular rhythm. Tachycardia present.     Heart sounds: Murmur heard.   Pulmonary:     Effort: Pulmonary effort is normal. No respiratory distress.     Breath sounds: Normal breath sounds. No stridor.  Chest:    Abdominal:     General: There is no distension.     Comments: Mild tenderness to palpation about the right lateral abdomen   Musculoskeletal:     Right lower leg: No edema.     Left lower leg: No edema.  Skin:    General: Skin is warm and dry.  Neurological:     Mental Status: She is alert and oriented to person, place, and time.     Cranial Nerves: No cranial nerve deficit.     ED Results / Procedures / Treatments   Labs (all labs ordered are listed, but only abnormal results are displayed) Labs Reviewed  CBC - Abnormal; Notable for the following components:      Result Value   WBC 30.8 (*)    RBC 3.72 (*)    Hemoglobin 7.5 (*)    HCT 26.2 (*)    MCV 70.4 (*)    MCH 20.2 (*)    MCHC 28.6 (*)    RDW 21.1 (*)    Platelets 516 (*)    All other components within normal limits  BASIC METABOLIC PANEL - Abnormal; Notable for the following components:   Glucose, Bld 153 (*)    BUN 24 (*)    Creatinine, Ser 1.86 (*)    GFR, Estimated 27 (*)    All other components within normal limits  RESP PANEL BY RT PCR (RSV, FLU A&B, COVID)  URINALYSIS, ROUTINE W REFLEX MICROSCOPIC  BRAIN NATRIURETIC PEPTIDE  LACTIC ACID, PLASMA  LACTIC ACID, PLASMA  DIGOXIN LEVEL    Radiology DG Chest 2 View  Result Date: 10/18/2020 CLINICAL DATA:  Aortic dissection. Aortic dissection diagnosed 09/13/2020. Shortness of breath and weakness. EXAM: CHEST - 2 VIEW COMPARISON:  Most recent chest radiograph 10/04/2020. FINDINGS: Median sternotomy similar cardiomegaly. Unchanged mediastinal contours with aortic tortuosity. Improvement in left lung aeration from prior exam with mild residual patchy opacity. Minimal fluid in the fissures without large subpulmonic effusion. Vascular congestion with possible pulmonary edema and Kerley B-lines at the bases. No pneumothorax. IMPRESSION: 1. Improving left lung aeration from prior exam, with mild residual patchy opacity, likely atelectasis. 2. Cardiomegaly with vascular congestion and possible pulmonary edema. 3. Stable mediastinal contours post aortic dissection repair.  Electronically Signed    By: Keith Rake M.D.   On: 10/18/2020 16:39    Procedures Procedures (including critical care time)  Medications Ordered in ED Medications  sodium chloride 0.9 % bolus 1,000 mL (has no administration in time range)    ED Course  I have reviewed the triage vital signs and the nursing notes.  Pertinent labs & imaging results that were available during my care of the patient were reviewed by me and considered in my medical decision making (see chart for details).     By the time initial evaluation the patient's initial labs are returning.  Labs notable for worsening renal function, as well as leukocytosis.  Given the patient's recent surgery, description of flank pain, anorexia, broad differential including infection, postprocedural prolonged recovery, kidney stones, kidney infection, all considered. Patient has additional labs, CT, x-ray, urinalysis all pending. Patient's initial vital signs generally reassuring, no hypotension, only mild tachycardia.Dr. Regenia Skeeter is aware of the patient. MDM Rules/Calculators/A&P Final Clinical Impression(s) / ED Diagnoses Final diagnoses:  Right flank pain  Delirium     Carmin Muskrat, MD 10/19/20 1530

## 2020-10-20 ENCOUNTER — Encounter (HOSPITAL_COMMUNITY): Payer: Self-pay | Admitting: Internal Medicine

## 2020-10-20 ENCOUNTER — Inpatient Hospital Stay (HOSPITAL_COMMUNITY): Payer: Medicare HMO

## 2020-10-20 DIAGNOSIS — N183 Chronic kidney disease, stage 3 unspecified: Secondary | ICD-10-CM | POA: Diagnosis not present

## 2020-10-20 DIAGNOSIS — N179 Acute kidney failure, unspecified: Secondary | ICD-10-CM

## 2020-10-20 DIAGNOSIS — Z8679 Personal history of other diseases of the circulatory system: Secondary | ICD-10-CM | POA: Diagnosis not present

## 2020-10-20 DIAGNOSIS — Z01810 Encounter for preprocedural cardiovascular examination: Secondary | ICD-10-CM

## 2020-10-20 DIAGNOSIS — I50811 Acute right heart failure: Secondary | ICD-10-CM

## 2020-10-20 DIAGNOSIS — I361 Nonrheumatic tricuspid (valve) insufficiency: Secondary | ICD-10-CM

## 2020-10-20 LAB — COMPREHENSIVE METABOLIC PANEL
ALT: 65 U/L — ABNORMAL HIGH (ref 0–44)
AST: 84 U/L — ABNORMAL HIGH (ref 15–41)
Albumin: 2.1 g/dL — ABNORMAL LOW (ref 3.5–5.0)
Alkaline Phosphatase: 68 U/L (ref 38–126)
Anion gap: 13 (ref 5–15)
BUN: 31 mg/dL — ABNORMAL HIGH (ref 8–23)
CO2: 23 mmol/L (ref 22–32)
Calcium: 9.2 mg/dL (ref 8.9–10.3)
Chloride: 102 mmol/L (ref 98–111)
Creatinine, Ser: 1.9 mg/dL — ABNORMAL HIGH (ref 0.44–1.00)
GFR, Estimated: 26 mL/min — ABNORMAL LOW (ref >60)
Glucose, Bld: 125 mg/dL — ABNORMAL HIGH (ref 70–99)
Potassium: 3.9 mmol/L (ref 3.5–5.1)
Sodium: 138 mmol/L (ref 135–145)
Total Bilirubin: 1.3 mg/dL — ABNORMAL HIGH (ref 0.3–1.2)
Total Protein: 7.3 g/dL (ref 6.5–8.1)

## 2020-10-20 LAB — HEMOGLOBIN AND HEMATOCRIT, BLOOD
HCT: 26.4 % — ABNORMAL LOW (ref 36.0–46.0)
Hemoglobin: 7.8 g/dL — ABNORMAL LOW (ref 12.0–15.0)

## 2020-10-20 LAB — CBC
HCT: 26.3 % — ABNORMAL LOW (ref 36.0–46.0)
Hemoglobin: 7.9 g/dL — ABNORMAL LOW (ref 12.0–15.0)
MCH: 21.2 pg — ABNORMAL LOW (ref 26.0–34.0)
MCHC: 30 g/dL (ref 30.0–36.0)
MCV: 70.7 fL — ABNORMAL LOW (ref 80.0–100.0)
Platelets: 388 10*3/uL (ref 150–400)
RBC: 3.72 MIL/uL — ABNORMAL LOW (ref 3.87–5.11)
RDW: 21 % — ABNORMAL HIGH (ref 11.5–15.5)
WBC: 24.2 10*3/uL — ABNORMAL HIGH (ref 4.0–10.5)
nRBC: 0.1 % (ref 0.0–0.2)

## 2020-10-20 LAB — GLUCOSE, CAPILLARY
Glucose-Capillary: 121 mg/dL — ABNORMAL HIGH (ref 70–99)
Glucose-Capillary: 115 mg/dL — ABNORMAL HIGH (ref 70–99)
Glucose-Capillary: 123 mg/dL — ABNORMAL HIGH (ref 70–99)
Glucose-Capillary: 101 mg/dL — ABNORMAL HIGH (ref 70–99)
Glucose-Capillary: 104 mg/dL — ABNORMAL HIGH (ref 70–99)

## 2020-10-20 LAB — HEPATIC FUNCTION PANEL
ALT: 63 U/L — ABNORMAL HIGH (ref 0–44)
AST: 84 U/L — ABNORMAL HIGH (ref 15–41)
Albumin: 2.1 g/dL — ABNORMAL LOW (ref 3.5–5.0)
Alkaline Phosphatase: 66 U/L (ref 38–126)
Bilirubin, Direct: 0.5 mg/dL — ABNORMAL HIGH (ref 0.0–0.2)
Indirect Bilirubin: 1 mg/dL — ABNORMAL HIGH (ref 0.3–0.9)
Total Bilirubin: 1.5 mg/dL — ABNORMAL HIGH (ref 0.3–1.2)
Total Protein: 7.4 g/dL (ref 6.5–8.1)

## 2020-10-20 LAB — IRON AND TIBC
Iron: 21 ug/dL — ABNORMAL LOW (ref 28–170)
Saturation Ratios: 9 % — ABNORMAL LOW (ref 10.4–31.8)
TIBC: 246 ug/dL — ABNORMAL LOW (ref 250–450)
UIBC: 225 ug/dL

## 2020-10-20 LAB — ECHOCARDIOGRAM LIMITED
Height: 64.5 in
MV M vel: 5.47 m/s
MV Peak grad: 119.7 mmHg
P 1/2 time: 275 msec
S' Lateral: 2.6 cm
Single Plane A2C EF: 41.9 %

## 2020-10-20 LAB — FERRITIN: Ferritin: 1134 ng/mL — ABNORMAL HIGH (ref 11–307)

## 2020-10-20 LAB — VITAMIN B12: Vitamin B-12: 1175 pg/mL — ABNORMAL HIGH (ref 180–914)

## 2020-10-20 MED ORDER — TRAMADOL HCL 50 MG PO TABS
50.0000 mg | ORAL_TABLET | Freq: Once | ORAL | Status: AC
  Administered 2020-10-20: 50 mg via ORAL
  Filled 2020-10-20: qty 1

## 2020-10-20 MED ORDER — SODIUM CHLORIDE 0.9 % IV SOLN
2.0000 g | INTRAVENOUS | Status: DC
  Administered 2020-10-20 – 2020-10-22 (×3): 2 g via INTRAVENOUS
  Filled 2020-10-20: qty 2
  Filled 2020-10-20: qty 20
  Filled 2020-10-20: qty 2
  Filled 2020-10-20: qty 20

## 2020-10-20 MED ORDER — INSULIN ASPART 100 UNIT/ML ~~LOC~~ SOLN
0.0000 [IU] | Freq: Three times a day (TID) | SUBCUTANEOUS | Status: DC
  Administered 2020-10-20: 1 [IU] via SUBCUTANEOUS

## 2020-10-20 MED ORDER — BISOPROLOL FUMARATE 5 MG PO TABS
5.0000 mg | ORAL_TABLET | Freq: Every day | ORAL | Status: DC
  Administered 2020-10-22 – 2020-10-24 (×3): 5 mg via ORAL
  Filled 2020-10-20 (×4): qty 1

## 2020-10-20 MED ORDER — FAMOTIDINE 20 MG PO TABS
10.0000 mg | ORAL_TABLET | Freq: Every day | ORAL | Status: DC
  Administered 2020-10-22 – 2020-10-24 (×3): 10 mg via ORAL
  Filled 2020-10-20 (×3): qty 1

## 2020-10-20 MED ORDER — SODIUM CHLORIDE 0.9 % IV SOLN
510.0000 mg | Freq: Once | INTRAVENOUS | Status: AC
  Administered 2020-10-20: 510 mg via INTRAVENOUS
  Filled 2020-10-20: qty 17

## 2020-10-20 MED ORDER — TRAMADOL HCL 50 MG PO TABS
50.0000 mg | ORAL_TABLET | Freq: Two times a day (BID) | ORAL | Status: DC
  Administered 2020-10-20 – 2020-10-24 (×7): 50 mg via ORAL
  Filled 2020-10-20 (×7): qty 1

## 2020-10-20 MED ORDER — LACTATED RINGERS IV SOLN: INTRAVENOUS | Status: DC

## 2020-10-20 MED ORDER — BISOPROLOL FUMARATE 5 MG PO TABS
2.5000 mg | ORAL_TABLET | Freq: Once | ORAL | Status: AC
  Administered 2020-10-20: 2.5 mg via ORAL
  Filled 2020-10-20: qty 1

## 2020-10-20 NOTE — Progress Notes (Signed)
   10/20/20 1413  Assess: MEWS Score  Temp 98.2 F (36.8 C)  BP 116/74  Pulse Rate (!) 112  Resp 20  SpO2 94 %  O2 Device Nasal Cannula  Assess: MEWS Score  MEWS Temp 0  MEWS Systolic 0  MEWS Pulse 2  MEWS RR 0  MEWS LOC 0  MEWS Score 2  MEWS Score Color Yellow  Assess: if the MEWS score is Yellow or Red  Were vital signs taken at a resting state? Yes  Focused Assessment No change from prior assessment  Early Detection of Sepsis Score *See Row Information* Medium  MEWS guidelines implemented *See Row Information* No, previously yellow, continue vital signs every 4 hours  Treat  MEWS Interventions Administered scheduled meds/treatments;Other (Comment) (EKG has been done and Cardiology consulted)  Take Vital Signs  Increase Vital Sign Frequency  Yellow: Q 2hr X 2 then Q 4hr X 2, if remains yellow, continue Q 4hrs  Escalate  MEWS: Escalate Yellow: discuss with charge nurse/RN and consider discussing with provider and RRT  Notify: Charge Nurse/RN  Name of Charge Nurse/RN Notified Hulan Amato, RN  Date Charge Nurse/RN Notified 10/20/20  Time Charge Nurse/RN Notified 1541  Notify: Provider  Provider Name/Title Dr. Verlon Au  Date Provider Notified 10/20/20  Time Provider Notified 1546  Notification Type Page  Notification Reason Other (Comment) (for awareness/Cardiology has been consulted and EKG done)  Response Other (Comment) (Dr. Brantley Stage had consulted Card based on history)  Date of Provider Response 10/20/20  Time of Provider Response 1547  Document  Patient Outcome Stabilized after interventions  Progress note created (see row info) Yes

## 2020-10-20 NOTE — Consult Note (Addendum)
Cardiology Consultation:   Patient ID: Danielle Atkins MRN: [id number]; DOB: 1935/12/11  Admit date: 10/19/2020 Date of Consult: 10/20/2020  Primary Care Provider: [name], [name], MD [facility] Cardiologist: Pixie Casino, MD  [facility] Electrophysiologist:  None   Patient Profile:   Danielle Atkins is a 85 y.o. female with a hx of recent type 1 aortic dissection s/p repair with resuspension of the aortic valve complicated by RV failure/post-cardiotomy shock and AKI on CKD stage III, expected ABL anemia, moderate-severe emphysema on CT 09/2020, mild-moderate PAH, otherwise prior diagnoses of HTN, HLD, DM, prior TIA, gout, peptic ulcer disease, prior upper GIB, hypothyroidism, RBBB, remote tobacco abuse who is being seen today for the evaluation of pre-op clearance at the request of Dr. Brantley Stage.  History of Present Illness:   Prior to recent hospitalization, [name] had not had any cardiac history. Her daughter works in neonatology and there are eventual plans to move the patient to [place] to live with another daughter once she is discharged from SNF.  To recap recent complex admission, she had originally gone to the ED 09/10/20 but LWBS due to prolonged weight time. She returned to ER 09/13/20 at the request of her PCP due to tachycardia/hypotension and was found to have type A thoracic aortic dissection extending into the mid right common iliac artery. The dissection flap also extended into the left subclavian artery, left axillary artery, left axillary branch, left common carotid artery with marked stenosis of its origin, into the origin of the celiac, superior mesenteric, and likely inferior mesenteric arteries. There was complete devascularization of the right kidney due to the right renal artery originating from the excluded lumen. She went emergently to the OR and underwent median sternotomy with replacement of ascending aorta using a 63mm Hemashield graft with  resuspension of the aortic valve. She was intubated for surgery but self-extubated on 10/2. Post-op course notable for acute hypoxic respiratory failure due to acute pulmonary edema/bilateral pleural effusions requiring diuresis, likely cardiogenic shock postoperatively, and AKI superimposed on CKD. Repeat post-op echo 09/19/20 showed abnormal septal motion, EF 50-55%, severely enlarged RV with severely reduced RV function, mild BAE, mild AI, no pericardial effusion. She was followed by the advanced HF team for RV failure and post-cardiotomy shock requiring milrinone and Lasix gtt. VQ scan was negative for PE. cMRI 09/25/20 showed RV EF 36% with moderately dilated RV on cMRI, no evidence for inferior/RV infarction by LGE.  Chest CT 09/27/20 showed moderate-severe emphysema therefore it was suspected she may have had some pre-existing RV dysfunction from this. Once diuresed, she underwent RHC 09/27/20 showing normal filling pressures, mild-moderate PAH and preserved cardiac output. Her pulm HTN was felt possibly related to ?OSA and COPD with need for sleep study once she eventually transitions out of SNF. Post-hospital course has been notable for deconditioning, weight loss and poor appetite.  She returned to the ED 10/19/2020 with pain in her back/flank, continued poor appetite as well as some confusion. She was found to have leukocytosis, tachycardia, worsening of recent anemia, SBO, multiple gallstones, and AKI on CKD. She was transfused 1UPRBC. There was no gallbladder wall thickening but there was a gallstone in the neck of the gallbladder. Given her tachycardia, leukocytosis and exam findings, general surgery is concerned for possible gangrenous cholecystitis therefore plan for lap cholecystectomy tomorrow if cleared by cardiology. FOBT is negative. Labs otherwise notable for mildly abnormal LFTs and decreased albumin of 2.1. She is currently seen at bedside somewhat restless/sleepy,  complains of dry mouth.  Denies any chest pain, dyspnea or edema.  Past Medical History:  Diagnosis Date   ANEMIA-NOS 06/25/2007   Aortic dissection ([place]) 09/14/2020   CKD (chronic kidney disease), stage III (HCC)    Diabetes mellitus ([place])    Diabetes mellitus with renal complications ([place]) 07/18/6658   Essential hypertension 06/25/2007   Qualifier: Diagnosis of  By: Paulina Fusi, RN, Daine Gravel    Former tobacco use    GOUT 11/16/2009   HTN (hypertension)    Hyperlipidemia    Hypothyroidism    Microcytic anemia 06/25/2007   Qualifier: Diagnosis of  By: Paulina Fusi RN, Daine Gravel    PARESTHESIA, HANDS 03/27/2009   Piriformis syndrome, left 09/29/2019   PUD (peptic ulcer disease) 06/25/2007   Qualifier: Diagnosis of  By: Teresa Coombs    Pure hypercholesterolemia 06/25/2007   Qualifier: Diagnosis of  By: Paulina Fusi RN, Daine Gravel    RBBB    Right heart failure ([place]) 09/20/2020   TIA (transient ischemic attack)    Upper GI bleed     Past Surgical History:  Procedure Laterality Date   RIGHT HEART CATH N/A 09/27/2020   Procedure: RIGHT HEART CATH;  Surgeon: Larey Dresser, MD;  Location: [facility];  Service: Cardiovascular;  Laterality: N/A;   THORACIC AORTIC ANEURYSM REPAIR N/A 09/13/2020   Procedure: TYPE 1 AORTIC DISSECTION WITH REPLACEMENT OF ASCENDING AORTA WITH 32MM HEMASHIELD GRAFT, RESUSPENSION OF AORTIC VALVE AND HYPOTHERMIC  CIRCULATORY ARRST WITH CEREBRAL PREFUSION  ;  Surgeon: [name], MD;  Location: [facility];  Service: Open Heart Surgery;  Laterality: N/A;     Home Medications:  Prior to Admission medications   Medication Sig Start Date End Date Taking? Authorizing Provider  albuterol (PROAIR HFA) 108 (90 Base) MCG/ACT inhaler Inhale 2 puffs into the lungs every 6 (six) hours as needed for wheezing. 12/28/18  Yes [name], MD  allopurinol (ZYLOPRIM) 300 MG tablet Take 1 tablet (300 mg total) by mouth daily. 10/05/20  Yes Gold, Patrick Jupiter E, PA-C  ascorbic acid (VITAMIN C) 500 MG tablet Take  1,000 mg by mouth in the morning.   Yes [provider]  aspirin EC 325 MG EC tablet Take 1 tablet (325 mg total) by mouth daily. 10/05/20  Yes Gold, Wayne E, PA-C  bisoprolol (ZEBETA) 5 MG tablet Take 0.5 tablets (2.5 mg total) by mouth daily. 10/05/20  Yes Gold, Wilder Glade, PA-C  Cholecalciferol (VITAMIN D3) 125 MCG (5000 UT) TABS Take 5,000 Units by mouth daily.   Yes [provider]  digoxin (LANOXIN) 0.125 MG tablet Take 0.5 tablets (0.0625 mg total) by mouth daily. Patient taking differently: Take 0.0625-0.125 mg by mouth See admin instructions. Take 0.0625 mg by mouth at 9 AM daily and alternate with 0.125 mg every other day 10/11/20  Yes Rosita Fire, Brittainy M, PA-C  famotidine (PEPCID) 40 MG tablet Take 1 tablet (40 mg total) by mouth daily. 10/05/20  Yes Gold, Wayne E, PA-C  furosemide (LASIX) 40 MG tablet Take 1 tablet (40 mg total) by mouth daily AND 0.5 tablets (20 mg total) daily. Alternate days between 40mg  and 20mg . Patient taking differently: Take 20 mg by mouth in the morning and alternate with 40 mg every other day 10/11/20  Yes Rosita Fire, Brittainy M, PA-C  latanoprost (XALATAN) 0.005 % ophthalmic solution Place 1 drop into both eyes at bedtime.  06/28/20  Yes [provider]  levothyroxine (SYNTHROID) 25 MCG tablet Take 1 tablet (25  mcg total) by mouth in the morning. 10/06/20  Yes Gold, Patrick Jupiter E, PA-C  metFORMIN (GLUCOPHAGE) 500 MG tablet Take 1 tablet (500 mg total) by mouth daily with breakfast. 10/05/20  Yes Gold, Wayne E, PA-C  potassium chloride SA (KLOR-CON) 20 MEQ tablet Take 1 tablet (20 mEq total) by mouth daily AND 0.5 tablets (10 mEq total) daily. Alternate days between 25meq and 64meq. Patient taking differently: Take 20 mEq by mouth in the morning 10/11/20  Yes Rosita Fire, Brittainy M, PA-C  simvastatin (ZOCOR) 40 MG tablet Take 1 tablet (40 mg total) by mouth daily. Patient taking differently: Take 40 mg by mouth at bedtime.  10/05/20  Yes Gold, Patrick Jupiter  E, PA-C  feeding supplement, GLUCERNA SHAKE, (GLUCERNA SHAKE) LIQD Take 237 mLs by mouth 2 (two) times daily between meals. 10/19/20   [provider]    Inpatient Medications: Scheduled Meds:  allopurinol  300 mg Oral Daily   vitamin C  1,000 mg Oral Daily   aspirin  325 mg Oral Daily   bisoprolol  2.5 mg Oral Daily   cholecalciferol  5,000 Units Oral Daily   digoxin  0.0625 mg Oral Daily   [START ON 10/21/2020] famotidine  10 mg Oral Daily   insulin aspart  0-9 Units Subcutaneous TID WC   latanoprost  1 drop Both Eyes QHS   levothyroxine  25 mcg Oral q AM   simvastatin  40 mg Oral Daily   sodium chloride flush  3 mL Intravenous Q12H   Continuous Infusions:  ceFEPime (MAXIPIME) IV Stopped (10/20/20 0117)   ferumoxytol     lactated ringers 75 mL/hr at 10/20/20 0917   metronidazole 500 mg (10/20/20 1129)   [START ON 10/21/2020] vancomycin     PRN Meds: acetaminophen **OR** acetaminophen, albuterol  Allergies:   No Known Allergies  Social History:   Social History   Socioeconomic History   Marital status: Widowed    Spouse name: Not on file   Number of children: Not on file   Years of education: Not on file   Highest education level: Not on file  Occupational History   Not on file  Tobacco Use   Smoking status: Former Smoker    Quit date: 12/16/1999    Years since quitting: 20.8   Smokeless tobacco: Never Used  Scientific laboratory technician Use: Never used  Substance and Sexual Activity   Alcohol use: No   Drug use: No   Sexual activity: Not on file  Other Topics Concern   Not on file  Social History Narrative   Not on file   Social Determinants of Health   Financial Resource Strain: Low Risk    Difficulty of Paying Living Expenses: Not hard at all  Food Insecurity:    Worried About Charity fundraiser in the Last Year: Not on file   [employer] of Food in the Last Year: Not on file  Transportation Needs: No Transportation Needs   Lack of Transportation  (Medical): No   Lack of Transportation (Non-Medical): No  Physical Activity:    Days of Exercise per Week: Not on file   Minutes of Exercise per Session: Not on file  Stress:    Feeling of Stress : Not on file  Social Connections:    Frequency of Communication with Friends and Family: Not on file   Frequency of Social Gatherings with Friends and Family: Not on file   Attends Religious Services: Not on file   Active Member  of Clubs or Organizations: Not on file   Attends Archivist Meetings: Not on file   Marital Status: Not on file  Intimate Partner Violence:    Fear of Current or Ex-Partner: Not on file   Emotionally Abused: Not on file   Physically Abused: Not on file   Sexually Abused: Not on file    Family History:   Family History  Problem Relation Age of Onset   Cancer Mother      ROS:  Please see the history of present illness.  All other ROS reviewed and negative.     Physical Exam/Data:   Vitals:   10/20/20 0357 10/20/20 0640 10/20/20 1036 10/20/20 1219  BP: 125/73 128/74 116/70 136/78  Pulse: (!) 104 (!) 101 (!) 111 (!) 108  Resp: 20 18 20 18   Temp: 98.2 F (36.8 C) 98.3 F (36.8 C) 98.3 F (36.8 C) 98.6 F (37 C)  TempSrc: Oral Oral Oral Oral  SpO2: 99% 99% 99%   Height:        Intake/Output Summary (Last 24 hours) at 10/20/2020 1319 Last data filed at 10/20/2020 0917 Gross per 24 hour  Intake 689 ml  Output 100 ml  Net 589 ml   Last 3 Weights 10/18/2020 10/10/2020 10/05/2020  Weight (lbs) 146 lb 6.4 oz 150 lb 9.6 oz 151 lb 9.6 oz  Weight (kg) 66.407 kg 68.312 kg 68.765 kg     Body mass index is 24.74 kg/m.   General: Ill appearing AAF in no acute distress. Head: Normocephalic, atraumatic, sclera non-icteric, no xanthomas, nares are without discharge. Dry mucous membranes Neck: Negative for carotid bruits. JVP not elevated. Lungs: Clear bilaterally to auscultation without wheezes, rales, or rhonchi. Breathing is unlabored. Heart: Reg  rhythm, mildly tachycardic, S1 S2 without murmurs, rubs, or gallops.  Abdomen: Soft, non-tender, non-distended with normoactive bowel sounds. No rebound/guarding. Extremities: No clubbing or cyanosis. No edema. Distal pedal pulses are 2+ and equal bilaterally. Neuro: Alert and oriented to place (hospital), thinks it's 2020, somewhat delayed mentation. Follows commands. C/o dry mouth  Psych:  Affect appears fatigued   EKG:  The EKG was personally reviewed and demonstrates:  Sinus tach 111bpm RBBB + LAFB with NSTT changes similar to prior except rate increased  Telemetry:  Telemetry was personally reviewed and demonstrates:  NSR/ST  Relevant CV Studies: RHC 09/27/20 1. Normal right and left heart filling pressures.  2. Mild-moderate pulmonary arterial hypertension. 3. Preserved cardiac output.   Limited echo 09/19/20  1. Abnormal septal motion . Left ventricular ejection fraction, by  estimation, is 50 to 55%. The left ventricle has low normal function.   2. Right ventricular systolic function is severely reduced. The right  ventricular size is severely enlarged.   3. Left atrial size was mildly dilated.   4. Right atrial size was mildly dilated.   5. Moderate mitral annular calcification.   6. Tricuspid valve regurgitation is moderate.   7. The aortic valve is abnormal. Aortic valve regurgitation is mild. Mild  to moderate aortic valve sclerosis/calcification is present, without any  evidence of aortic stenosis.   8. Normal diameter previous graft repair.   Laboratory Data:  High Sensitivity Troponin:  No results for input(s): TROPONINIHS in the last 720 hours.   Chemistry Recent Labs  Lab 10/19/20 1300 10/20/20 0901  NA 135 138  K 4.5 3.9  CL 99 102  CO2 24 23  GLUCOSE 153* 125*  BUN 24* 31*  CREATININE 1.86* 1.90*  CALCIUM 9.6  9.2  GFRNONAA 27* 26*  ANIONGAP 12 13    Recent Labs  Lab 10/20/20 0901  PROT 7.4  7.3  ALBUMIN 2.1*  2.1*  AST 84*  84*  ALT 63*   65*  ALKPHOS 66  68  BILITOT 1.5*  1.3*   Hematology Recent Labs  Lab 10/19/20 1300 10/19/20 2010 10/20/20 0901  WBC 30.8* 25.5* 24.2*  RBC 3.72*  3.76* 3.41* 3.72*  HGB 7.5* 7.0* 7.9*  7.8*  HCT 26.2* 24.1* 26.3*  26.4*  MCV 70.4* 70.7* 70.7*  MCH 20.2* 20.5* 21.2*  MCHC 28.6* 29.0* 30.0  RDW 21.1* 21.1* 21.0*  PLT 516* 467* 388   BNP Recent Labs  Lab 10/19/20 1522  BNP 580.5*    DDimer No results for input(s): DDIMER in the last 168 hours.   Radiology/Studies:  DG Chest 2 View  Result Date: 10/18/2020 CLINICAL DATA:  Aortic dissection. Aortic dissection diagnosed 09/13/2020. Shortness of breath and weakness. EXAM: CHEST - 2 VIEW COMPARISON:  Most recent chest radiograph 10/04/2020. FINDINGS: Median sternotomy similar cardiomegaly. Unchanged mediastinal contours with aortic tortuosity. Improvement in left lung aeration from prior exam with mild residual patchy opacity. Minimal fluid in the fissures without large subpulmonic effusion. Vascular congestion with possible pulmonary edema and Kerley B-lines at the bases. No pneumothorax. IMPRESSION: 1. Improving left lung aeration from prior exam, with mild residual patchy opacity, likely atelectasis. 2. Cardiomegaly with vascular congestion and possible pulmonary edema. 3. Stable mediastinal contours post aortic dissection repair. Electronically Signed   By: Keith Rake M.D.   On: 10/18/2020 16:39   CT Head Wo Contrast  Result Date: 10/19/2020 CLINICAL DATA:  Delirium. EXAM: CT HEAD WITHOUT CONTRAST TECHNIQUE: Contiguous axial images were obtained from the base of the skull through the vertex without intravenous contrast. COMPARISON:  CT head 09/13/2020, CT head 07/30/2003. FINDINGS: Brain: Cerebral ventricle sizes are concordant with the degree of cerebral volume loss. Patchy and confluent areas of decreased attenuation are noted throughout the deep and periventricular white matter of the cerebral hemispheres bilaterally,  compatible with chronic microvascular ischemic disease. No evidence of large-territorial acute infarction. No parenchymal hemorrhage. No mass lesion. No extra-axial collection. No mass effect or midline shift. No hydrocephalus. Basilar cisterns are patent. Vascular: No hyperdense vessel. Atherosclerotic calcifications are present within the cavernous internal carotid arteries. Skull: No acute fracture or focal lesion.Hyperostosis frontalis interna. Sinuses/Orbits: Right maxillary mucosal thickening. Left ethmoid sinus osteoma versus inspissated mucus similar to prior. Otherwise paranasal and mastoid air cells are clear. Bilateral lens replacement. Otherwise the orbits are unremarkable. Other: Dental hardware. IMPRESSION: No acute intracranial abnormality in a patient with microvascular ischemic changes. Electronically Signed   By: Iven Finn M.D.   On: 10/19/2020 18:25   DG Chest Port 1 View  Result Date: 10/19/2020 CLINICAL DATA:  Altered mental status EXAM: PORTABLE CHEST 1 VIEW COMPARISON:  10/18/2020 FINDINGS: Cardiac shadow is enlarged but stable. Postsurgical changes are noted. Tortuosity of the thoracic aorta is noted. The lungs are well aerated bilaterally. Mild basilar atelectasis on the left is seen. No new focal abnormality is noted. Previously seen vascular congestion has resolved. IMPRESSION: Left basilar atelectasis. Improved vascular congestion. Electronically Signed   By: Inez Catalina M.D.   On: 10/19/2020 15:36   CT Renal Stone Study  Result Date: 10/19/2020 CLINICAL DATA:  Bilateral flank pain EXAM: CT ABDOMEN AND PELVIS WITHOUT CONTRAST TECHNIQUE: Multidetector CT imaging of the abdomen and pelvis was performed following the standard protocol without IV contrast. COMPARISON:  September 13, 2020 FINDINGS: Lower chest: There is unchanged cardiomegaly with aortic knob calcifications. A tortuous descending aorta with basilar patchy airspace opacity at the posterior left lung base. No  hiatal hernia. Hepatobiliary: Although limited due to the lack of intravenous contrast, normal in appearance without gross focal abnormality. Layering calcified gallstones are present. Pancreas:  Unremarkable.  No surrounding inflammatory changes. Spleen: Normal in size. Although limited due to the lack of intravenous contrast, normal in appearance. Adrenals/Urinary Tract: Both adrenal glands appear normal. The kidneys and collecting system appear normal without evidence of urinary tract calculus or hydronephrosis. Bladder is unremarkable. Stomach/Bowel: The stomach and small bowel. There is a moderate amount stool throughout. There is mildly air and stool-filled distended cecum and transverse colon measuring up to 7.4 cm. No focal transition point or wall thickening is noted. Scattered colonic diverticula are noted. Vascular/Lymphatic: There are no enlarged abdominal or pelvic lymph nodes. Scattered aortic atherosclerosis with a tortuous appearance is. Reproductive: The patient is status post hysterectomy. No adnexal masses or collections seen. Other: No evidence of abdominal wall mass or hernia. Musculoskeletal: No acute or significant osseous findings. IMPRESSION: Mildly dilated stool and air-filled cecum and transverse colon which could be due to ileus or partial small bowel obstruction. No focal transition point or wall thickening. Cholelithiasis Aortic Atherosclerosis (ICD10-I70.0). Electronically Signed   By: Prudencio Pair M.D.   On: 10/19/2020 18:02   US Abdomen Limited RUQ (LIVER/GB)  Result Date: 10/19/2020 CLINICAL DATA:  Right side abdominal pain EXAM: ULTRASOUND ABDOMEN LIMITED RIGHT UPPER QUADRANT COMPARISON:  02/29/2020 FINDINGS: Gallbladder: Multiple stones within the gallbladder. 9 mm stone in the region of the gallbladder neck is non mobile. Gallbladder wall normal thickness at 2.6 mm. No pericholecystic fluid. Common bile duct: Diameter: Normal caliber, 3 mm. Liver: Heterogeneous echotexture.  No mass or biliary ductal dilatation. Portal vein is patent on color Doppler imaging with normal direction of blood flow towards the liver. Other: None. IMPRESSION: Cholelithiasis. 9 mm gallstone non mobile in the gallbladder neck region. No sonographic evidence of acute cholecystitis. Electronically Signed   By: Rolm Baptise M.D.   On: 10/19/2020 20:20     Assessment and Plan:   1. Leukocytosis, flank pain, cholelithiasis with clinical picture concerning for gangrenous cholecystitis complicated by possible SBO, acute metabolic encephalopathy, worsening anemia, AKI on CKD stage III - surgery/IM involved - see below regarding AKI/digoxin  2. Preoperative clearance for cholecystectomy - given recent complex admission with multiple medical comorbidities, suspect she is higher risk at baseline - she has not had previous ischemic assessment but troponins last admission were relatively low (69->56) despite degree of medical illness and now does not seem like the appropriate time to pursue this - will review with Dr. Gasper Sells  3. Right heart failure - felt multifactorial last admission possibly due to pre-existing COPD, +/- OSA, complicated by recent aortic dissection - per discussion with Dr. Gasper Sells, continue digoxin cautiously but monitor creatinine daily (digoxin level yesterday was 0.9) - Lasix on hold and receiving lactated ringers given suspected intravascular depletion - update echocardiogram to follow up result from last admission - have placed message into echo tech to assist  4. Worsening anemia superimposed on recent ABL anemia - transfused 1 U PRBC - IM following closely  5. Recent type 1 aortic dissection - remains on ASA at this time - hold statin given rise in LFTs and Cr  6. Bifascicular block - noted on EKG last admission as well - remains on home beta  blocker but follow on telemetry   New York Heart Association (NYHA) Functional Class NYHA Class III   For  questions or updates, please contact [facility] Please consult [email or web address] for contact info under    Signed, Charlie Pitter, PA-C  10/20/2020 1:19 PM  Personally seen and examined. Agree with APP above with the following comments: Briefly 85 yo F with a history of recent aortic dissection with complications of shock and RV failure.   Patient is orient times three but is confused on examination.  Listless and in pain Exam notable for RV heave, abdominal tenderness Labs notable for WB initially 30 and still 24 despite ABX Personally reviewed relevant tests; Still moderate RV dysfunction on prior CMR Discussed with patient, son and daughter who is a neonatologist in [place]:  Patient is at moderate risk for cardiac complications including heart failure and negative sequelae.  Surgery has discuss options and patient appears to also have significant risks for medical therapy only.  It is reasonable to proceed with this surgery. Holding lasix, will cautiously continue dig (monitor in the setting of AKI) and bisoprolol. Will follow as she proceeds with surgery Getting echocardiogram limited LV/RV eval to better understand her baseline (any RV recovery).  Werner Lean, MD

## 2020-10-20 NOTE — Progress Notes (Signed)
   10/20/20 1036  Assess: MEWS Score  Temp 98.3 F (36.8 C)  BP 116/70  Pulse Rate (!) 111  Resp 20  SpO2 99 %  Assess: MEWS Score  MEWS Temp 0  MEWS Systolic 0  MEWS Pulse 2  MEWS RR 0  MEWS LOC 0  MEWS Score 2  MEWS Score Color Yellow  Assess: if the MEWS score is Yellow or Red  Were vital signs taken at a resting state? Yes  Focused Assessment No change from prior assessment  Early Detection of Sepsis Score *See Row Information* Medium  MEWS guidelines implemented *See Row Information* No, vital signs rechecked  Treat  MEWS Interventions Administered scheduled meds/treatments  Notify: Charge Nurse/RN  Name of Charge Nurse/RN Notified Hulan Amato, Rn  Date Charge Nurse/RN Notified 10/20/20  Time Charge Nurse/RN Notified 1221  Called Central Monitoring and discussed the pulse rate, pulse has consistently been in the low 100s (101-108). We will continue to observe pt and the pulse rate. Pt also received Zebeta this am which is a beta blocker and may lower the rate.

## 2020-10-20 NOTE — Progress Notes (Signed)
  Echocardiogram 2D Echocardiogram has been performed.  Danielle Atkins 10/20/2020, 4:37 PM

## 2020-10-20 NOTE — Progress Notes (Signed)
Pt will be transferring to 2C17.

## 2020-10-20 NOTE — Progress Notes (Signed)
PROGRESS NOTE    Danielle Atkins  GBT:[id number] DOB: 02-25-1935 DOA: 10/19/2020 PCP: [name], [name], MD  Brief Narrative:   85 year old black female Recent emergent repair type I aortic dissection 09/13/2020-at that admission developed acute kidney injury had post cardiotomy shock History of prior TIA, DM TY 2, HTN, HLD Admitted with 2 days of decreased activity confusion abdominal pain-unable to participate in HPI Admitted for metabolic encephalopathy blood pressures 710 systolic creatinine up from 1.2-1.8 hemoglobin 7.5 down from 9.2 lactic acid 2.2 BNP 500, WBC 30  Not been herself and sleeps a lot since the past 1-2 weeks and has lost some weight and felt like comething "exploded" in her side--has had pain on the R flank  Assessment & Plan:   Principal Problem:   ANEMIA-NOS Active Problems:   Essential hypertension   Right heart failure (HCC)   Diabetes mellitus ([place])   TIA (transient ischemic attack)   Encephalopathy acute   Acute renal failure superimposed on stage 3 chronic kidney disease (HCC)   SIRS (systemic inflammatory response syndrome) ([place])   1. Acute metabolic encephalopathy?  Infection a. Patient seems confused on admission but is now apparently improved b. Would hold nephrotoxins and adjust medications cleared by kidney given AKI as this may contribute to confusion c. Improving some 2. ?  SBO a. Imaging was suggestive of the same b. Will advance diet and reassess and if unable to tolerate will probably need to keep n.p.o. 3. Leukocytosis a. Either secondary to infection or other process-lactic acid was 2.2 admission b. Continuing vancomycin cefepime Flagyl c. Abdominal ultrasound showed nonmobile 9 mm stone in gallbladder neck d. CT showed possible cholelithiasis LFTs only minimally elevated e. Dr. Brantley Stage gen surgery agrees to see--hold HIDA for now  f. Blood cultures from 11/5X2 are pending, urine culture from 11/5 pending 4. Anemia of  unclear etiology?  Blood loss a. Baseline hemoglobin 8-9 currently in the seven range b. transfused 1 u PRBC c. Iron studies show decreased saturations and low iron so we will need some Feraheme d. Monitor for acute bleed with Hemoccult 5. Acute kidney injury superimposed on CKD 3 a. Kidney function not much better BUNs/creatinine about the same as it was on admission baseline 22/1.2 b. Bladder scan c. Continue LR 75 cc/h monitor trends and if retaining may need Foley d. Will get non emergent Renal US given concerns for poor renal function per daugther 6. Chronic right heart failure after type I aortic dissection a. Continue bisoprolol 2.5 digoxin 0.0625 b.  monitor on telemetry c. A.m. magnesium EDC 7. DM TY 2 a. Changed to 3 times daily with meals- b. CBG ranging from one  115-->120  DVT prophylaxis: SCDs Code Status: Full Family Communication: called Danielle Atkins  [phone] Disposition:   Status is: Inpatient  Remains inpatient appropriate because:Hemodynamically unstable, Persistent severe electrolyte disturbances and Ongoing active pain requiring inpatient pain management   Dispo: The patient is from: Home              Anticipated d/c is to: SNF              Anticipated d/c date is: 3 days              Patient currently is not medically stable to d/c.       Consultants:   None yet  Procedures: None  Antimicrobials: As above   Subjective: Awake coherent is able to tell me where she is slightly sleepy no chest pain  no fever No cough no cold no diarrhea  Objective: Vitals:   10/20/20 0218 10/20/20 0329 10/20/20 0357 10/20/20 0640  BP: 119/66 121/67 125/73 128/74  Pulse: 97 (!) 103 (!) 104 (!) 101  Resp: '17 18 20 18  ' Temp: 97.9 F (36.6 C) 98.1 F (36.7 C) 98.2 F (36.8 C) 98.3 F (36.8 C)  TempSrc: Oral Oral Oral Oral  SpO2: 99% 99% 99% 99%    Intake/Output Summary (Last 24 hours) at 10/20/2020 0744 Last data filed at 10/20/2020 0640 Gross per  24 hour  Intake 689 ml  Output --  Net 689 ml   There were no vitals filed for this visit.  Examination:  General exam: EOMI NCAT no focal deficit no icterus no pallor Respiratory system: Chest clear posterolaterally no rales rhonchi Cardiovascular system: S1-S2 no murmur no rub no gallop Gastrointestinal system: Soft nontender no rebound no guarding slightly distended Central nervous system: Neuro intact moving all four limbs equally Extremities: No lower extremity edema ROM intact to major joints without restricted ROM Skin: No lower extremity edema as above Psychiatry: Euthymic pleasant  Data Reviewed: I have personally reviewed following labs and imaging studies BUN/creatinine 24/1.8-->31/1.9 LFTs slightly elevated AST/ALT 84/63, alk phos 66, bilirubin total 1.5 White count down from 30-->24.2 Hemoglobin 7.9 Platelet 388  Radiology Studies: DG Chest 2 View  Result Date: 10/18/2020 CLINICAL DATA:  Aortic dissection. Aortic dissection diagnosed 09/13/2020. Shortness of breath and weakness. EXAM: CHEST - 2 VIEW COMPARISON:  Most recent chest radiograph 10/04/2020. FINDINGS: Median sternotomy similar cardiomegaly. Unchanged mediastinal contours with aortic tortuosity. Improvement in left lung aeration from prior exam with mild residual patchy opacity. Minimal fluid in the fissures without large subpulmonic effusion. Vascular congestion with possible pulmonary edema and Kerley B-lines at the bases. No pneumothorax. IMPRESSION: 1. Improving left lung aeration from prior exam, with mild residual patchy opacity, likely atelectasis. 2. Cardiomegaly with vascular congestion and possible pulmonary edema. 3. Stable mediastinal contours post aortic dissection repair. Electronically Signed   By: Keith Rake M.D.   On: 10/18/2020 16:39   CT Head Wo Contrast  Result Date: 10/19/2020 CLINICAL DATA:  Delirium. EXAM: CT HEAD WITHOUT CONTRAST TECHNIQUE: Contiguous axial images were obtained from  the base of the skull through the vertex without intravenous contrast. COMPARISON:  CT head 09/13/2020, CT head 07/30/2003. FINDINGS: Brain: Cerebral ventricle sizes are concordant with the degree of cerebral volume loss. Patchy and confluent areas of decreased attenuation are noted throughout the deep and periventricular white matter of the cerebral hemispheres bilaterally, compatible with chronic microvascular ischemic disease. No evidence of large-territorial acute infarction. No parenchymal hemorrhage. No mass lesion. No extra-axial collection. No mass effect or midline shift. No hydrocephalus. Basilar cisterns are patent. Vascular: No hyperdense vessel. Atherosclerotic calcifications are present within the cavernous internal carotid arteries. Skull: No acute fracture or focal lesion.Hyperostosis frontalis interna. Sinuses/Orbits: Right maxillary mucosal thickening. Left ethmoid sinus osteoma versus inspissated mucus similar to prior. Otherwise paranasal and mastoid air cells are clear. Bilateral lens replacement. Otherwise the orbits are unremarkable. Other: Dental hardware. IMPRESSION: No acute intracranial abnormality in a patient with microvascular ischemic changes. Electronically Signed   By: Iven Finn M.D.   On: 10/19/2020 18:25   DG Chest Port 1 View  Result Date: 10/19/2020 CLINICAL DATA:  Altered mental status EXAM: PORTABLE CHEST 1 VIEW COMPARISON:  10/18/2020 FINDINGS: Cardiac shadow is enlarged but stable. Postsurgical changes are noted. Tortuosity of the thoracic aorta is noted. The lungs are well aerated bilaterally.  Mild basilar atelectasis on the left is seen. No new focal abnormality is noted. Previously seen vascular congestion has resolved. IMPRESSION: Left basilar atelectasis. Improved vascular congestion. Electronically Signed   By: Inez Catalina M.D.   On: 10/19/2020 15:36   CT Renal Stone Study  Result Date: 10/19/2020 CLINICAL DATA:  Bilateral flank pain EXAM: CT ABDOMEN AND  PELVIS WITHOUT CONTRAST TECHNIQUE: Multidetector CT imaging of the abdomen and pelvis was performed following the standard protocol without IV contrast. COMPARISON:  September 13, 2020 FINDINGS: Lower chest: There is unchanged cardiomegaly with aortic knob calcifications. A tortuous descending aorta with basilar patchy airspace opacity at the posterior left lung base. No hiatal hernia. Hepatobiliary: Although limited due to the lack of intravenous contrast, normal in appearance without gross focal abnormality. Layering calcified gallstones are present. Pancreas:  Unremarkable.  No surrounding inflammatory changes. Spleen: Normal in size. Although limited due to the lack of intravenous contrast, normal in appearance. Adrenals/Urinary Tract: Both adrenal glands appear normal. The kidneys and collecting system appear normal without evidence of urinary tract calculus or hydronephrosis. Bladder is unremarkable. Stomach/Bowel: The stomach and small bowel. There is a moderate amount stool throughout. There is mildly air and stool-filled distended cecum and transverse colon measuring up to 7.4 cm. No focal transition point or wall thickening is noted. Scattered colonic diverticula are noted. Vascular/Lymphatic: There are no enlarged abdominal or pelvic lymph nodes. Scattered aortic atherosclerosis with a tortuous appearance is. Reproductive: The patient is status post hysterectomy. No adnexal masses or collections seen. Other: No evidence of abdominal wall mass or hernia. Musculoskeletal: No acute or significant osseous findings. IMPRESSION: Mildly dilated stool and air-filled cecum and transverse colon which could be due to ileus or partial small bowel obstruction. No focal transition point or wall thickening. Cholelithiasis Aortic Atherosclerosis (ICD10-I70.0). Electronically Signed   By: Prudencio Pair M.D.   On: 10/19/2020 18:02   US Abdomen Limited RUQ (LIVER/GB)  Result Date: 10/19/2020 CLINICAL DATA:  Right side  abdominal pain EXAM: ULTRASOUND ABDOMEN LIMITED RIGHT UPPER QUADRANT COMPARISON:  02/29/2020 FINDINGS: Gallbladder: Multiple stones within the gallbladder. 9 mm stone in the region of the gallbladder neck is non mobile. Gallbladder wall normal thickness at 2.6 mm. No pericholecystic fluid. Common bile duct: Diameter: Normal caliber, 3 mm. Liver: Heterogeneous echotexture. No mass or biliary ductal dilatation. Portal vein is patent on color Doppler imaging with normal direction of blood flow towards the liver. Other: None. IMPRESSION: Cholelithiasis. 9 mm gallstone non mobile in the gallbladder neck region. No sonographic evidence of acute cholecystitis. Electronically Signed   By: Rolm Baptise M.D.   On: 10/19/2020 20:20     Scheduled Meds: . allopurinol  300 mg Oral Daily  . vitamin C  1,000 mg Oral Daily  . aspirin  325 mg Oral Daily  . bisoprolol  2.5 mg Oral Daily  . cholecalciferol  5,000 Units Oral Daily  . digoxin  0.0625 mg Oral Daily  . famotidine  40 mg Oral Daily  . insulin aspart  0-9 Units Subcutaneous Q4H  . latanoprost  1 drop Both Eyes QHS  . levothyroxine  25 mcg Oral q AM  . simvastatin  40 mg Oral Daily  . sodium chloride flush  3 mL Intravenous Q12H   Continuous Infusions: . ceFEPime (MAXIPIME) IV Stopped (10/20/20 0117)  . lactated ringers 75 mL/hr at 10/20/20 0708  . metronidazole Stopped (10/20/20 0117)  . [START ON 10/21/2020] vancomycin       LOS: 1 day  Time spent: [place], MD [facility] To contact the attending provider between 7A-7P or the covering provider during after hours 7P-7A, please log into the web site [email or web address] and access using universal [place] password for that web site. If you do not have the password, please call the hospital operator.  10/20/2020, 7:44 AM

## 2020-10-20 NOTE — Consult Note (Signed)
Reason for Consult gallstones/acute cholecystitis Referring Physician: Nita Sells, MD  Danielle Atkins is an 85 y.o. female.  HPI: Asked to see patient at the request of Nita Sells, MD for evaluation of right upper quadrant abdominal pain, nausea, vomiting, poor p.o. intake over the last 3 to 4 days.  Her son is at the bedside.  Has a history of repair of type I aortic dissection in October 2021.  She was discharged to skilled nursing where she has been undergoing physical therapy and had a follow-up with her heart surgeon a couple days ago.  She was brought to the emergency room last night for a 4-day history of back and flank pain bilaterally.  She also has not been eating well at all complaining of no appetite.  There is been no nausea or vomiting.  CT scan of the abdomen showed multiple gallstones.  She had a white count of 21,000.  History of congestive heart failure which is right-sided.  Ultrasound performed which showed multiple gallstones.  There is no gallbladder wall thickening but there is a gallstone in the neck of the gallbladder.  She also suffering from anemia and was given a blood transfusion by the admitting physician.  She is quite lethargic but complains of pain in her stomach in the right upper quadrant. Past Medical History:  Diagnosis Date  . ANEMIA-NOS 06/25/2007  . Aortic aneurysm ([place]) 09/13/2020  . Aortic dissection ([place]) 09/14/2020  . CKD (chronic kidney disease) stage 3, GFR 30-59 ml/min (HCC) 09/01/2019  . CKD (chronic kidney disease), stage III ([place])   . Diabetes mellitus ([place])   . Diabetes mellitus with renal complications ([place]) 12/15/5724  . Disorder resulting from impaired renal function 06/25/2007   Qualifier: Diagnosis of  By: Paulina Fusi RN, Daine Gravel   . Essential hypertension 06/25/2007   Qualifier: Diagnosis of  By: Paulina Fusi RN, Daine Gravel   . Former tobacco use   . GOUT 11/16/2009  . History of cardiovascular disorder 06/25/2007   Qualifier:  Diagnosis of  By: Paulina Fusi, RN, Daine Gravel   . HTN (hypertension)   . Hyperlipidemia   . Microcytic anemia 06/25/2007   Qualifier: Diagnosis of  By: Paulina Fusi RN, Daine Gravel   . PARESTHESIA, HANDS 03/27/2009  . PEPTIC ULCER DISEASE 06/25/2007  . Piriformis syndrome, left 09/29/2019  . PUD (peptic ulcer disease) 06/25/2007   Qualifier: Diagnosis of  By: Paulina Fusi RN, Daine Gravel   . Pure hypercholesterolemia 06/25/2007   Qualifier: Diagnosis of  By: Paulina Fusi RN, Daine Gravel   . RBBB   . Right heart failure ([place]) 09/20/2020  . TIA (transient ischemic attack)   . Upper GI bleed     Past Surgical History:  Procedure Laterality Date  . RIGHT HEART CATH N/A 09/27/2020   Procedure: RIGHT HEART CATH;  Surgeon: Larey Dresser, MD;  Location: [facility];  Service: Cardiovascular;  Laterality: N/A;  . THORACIC AORTIC ANEURYSM REPAIR N/A 09/13/2020   Procedure: TYPE 1 AORTIC DISSECTION WITH REPLACEMENT OF ASCENDING AORTA WITH 32MM HEMASHIELD GRAFT, RESUSPENSION OF AORTIC VALVE AND HYPOTHERMIC  CIRCULATORY ARRST WITH CEREBRAL PREFUSION  ;  Surgeon: [name], MD;  Location: [facility];  Service: Open Heart Surgery;  Laterality: N/A;    Family History  Problem Relation Age of Onset  . Cancer Mother     Social History:  reports that she quit smoking about 20 years ago. She has never used smokeless tobacco. She reports that she does not drink alcohol and does  not use drugs.  Allergies: No Known Allergies  Medications: I have reviewed the patient's current medications.  Results for orders placed or performed during the hospital encounter of 10/19/20 (from the past 48 hour(s))  CBC     Status: Abnormal   Collection Time: 10/19/20  1:00 PM  Result Value Ref Range   WBC 30.8 (H) 4.0 - 10.5 K/uL   RBC 3.72 (L) 3.87 - 5.11 MIL/uL   Hemoglobin 7.5 (L) 12.0 - 15.0 g/dL    Comment: Reticulocyte Hemoglobin testing may be clinically indicated, consider ordering this additional test [id number]    HCT 26.2 (L)  36 - 46 %   MCV 70.4 (L) 80.0 - 100.0 fL   MCH 20.2 (L) 26.0 - 34.0 pg   MCHC 28.6 (L) 30.0 - 36.0 g/dL   RDW 21.1 (H) 11.5 - 15.5 %   Platelets 516 (H) 150 - 400 K/uL   nRBC 0.2 0.0 - 0.2 %    Comment: Performed at [facility], [place] [address] [place], [place] [address]  Basic metabolic panel     Status: Abnormal   Collection Time: 10/19/20  1:00 PM  Result Value Ref Range   Sodium 135 135 - 145 mmol/L   Potassium 4.5 3.5 - 5.1 mmol/L   Chloride 99 98 - 111 mmol/L   CO2 24 22 - 32 mmol/L   Glucose, Bld 153 (H) 70 - 99 mg/dL    Comment: Glucose reference range applies only to samples taken after fasting for at least 8 hours.   BUN 24 (H) 8 - 23 mg/dL   Creatinine, Ser 1.86 (H) 0.44 - 1.00 mg/dL   Calcium 9.6 8.9 - 10.3 mg/dL   GFR, Estimated 27 (L) >60 mL/min    Comment: (NOTE) Calculated using the CKD-EPI Creatinine Equation (2021)    Anion gap 12 5 - 15    Comment: Performed at [facility] [address], [place], [place] [address]  Reticulocytes     Status: Abnormal   Collection Time: 10/19/20  1:00 PM  Result Value Ref Range   Retic Ct Pct 4.1 (H) 0.4 - 3.1 %   RBC. 3.76 (L) 3.87 - 5.11 MIL/uL   Retic Count, Absolute 153.0 19.0 - 186.0 K/uL   Immature Retic Fract 40.4 (H) 2.3 - 15.9 %    Comment: Performed at [facility] [address], [place], [place] [address]  Resp Panel by RT PCR (RSV, Flu A&B, Covid) - Nasopharyngeal Swab     Status: None   Collection Time: 10/19/20  3:21 PM   Specimen: Nasopharyngeal Swab  Result Value Ref Range   SARS Coronavirus 2 by RT PCR NEGATIVE NEGATIVE    Comment: (NOTE) SARS-CoV-2 target nucleic acids are NOT DETECTED.  The SARS-CoV-2 RNA is generally detectable in upper respiratoy specimens during the acute phase of infection. The lowest concentration of SARS-CoV-2 viral copies this assay can detect is 131 copies/mL. A negative result does not preclude SARS-Cov-2 infection and should not be used as the sole  basis for treatment or other patient management decisions. A negative result may occur with  improper specimen collection/handling, submission of specimen other than nasopharyngeal swab, presence of viral mutation(s) within the areas targeted by this assay, and inadequate number of viral copies (<131 copies/mL). A negative result must be combined with clinical observations, patient history, and epidemiological information. The expected result is Negative.  Fact Sheet for Patients:  PinkCheek.be  Fact Sheet for Healthcare Providers:  GravelBags.it  This test is no t yet approved or cleared by the [place] and  has been authorized for detection and/or diagnosis of SARS-CoV-2 by FDA under an Emergency Use Authorization (EUA). This EUA will remain  in effect (meaning this test can be used) for the duration of the COVID-19 declaration under Section 564(b)(1) of the Act, 21 U.S.C. section 360bbb-3(b)(1), unless the authorization is terminated or revoked sooner.     Influenza A by PCR NEGATIVE NEGATIVE   Influenza B by PCR NEGATIVE NEGATIVE    Comment: (NOTE) The Xpert Xpress SARS-CoV-2/FLU/RSV assay is intended as an aid in  the diagnosis of influenza from Nasopharyngeal swab specimens and  should not be used as a sole basis for treatment. Nasal washings and  aspirates are unacceptable for Xpert Xpress SARS-CoV-2/FLU/RSV  testing.  Fact Sheet for Patients: PinkCheek.be  Fact Sheet for Healthcare Providers: GravelBags.it  This test is not yet approved or cleared by the [place] FDA and  has been authorized for detection and/or diagnosis of SARS-CoV-2 by  FDA under an Emergency Use Authorization (EUA). This EUA will remain  in effect (meaning this test can be used) for the duration of the  Covid-19 declaration under Section 564(b)(1) of the Act, 21   U.S.C. section 360bbb-3(b)(1), unless the authorization is  terminated or revoked.    Respiratory Syncytial Virus by PCR NEGATIVE NEGATIVE    Comment: (NOTE) Fact Sheet for Patients: PinkCheek.be  Fact Sheet for Healthcare Providers: GravelBags.it  This test is not yet approved or cleared by the [place] FDA and  has been authorized for detection and/or diagnosis of SARS-CoV-2 by  FDA under an Emergency Use Authorization (EUA). This EUA will remain  in effect (meaning this test can be used) for the duration of the  COVID-19 declaration under Section 564(b)(1) of the Act, 21 U.S.C.  section 360bbb-3(b)(1), unless the authorization is terminated or  revoked. Performed at [facility], [place] [address] [place], [place] [address]   Brain natriuretic peptide     Status: Abnormal   Collection Time: 10/19/20  3:22 PM  Result Value Ref Range   B Natriuretic Peptide 580.5 (H) 0.0 - 100.0 pg/mL    Comment: Performed at [facility] [address] [place], [place] [address]  Lactic acid, plasma     Status: Abnormal   Collection Time: 10/19/20  3:22 PM  Result Value Ref Range   Lactic Acid, Venous 2.2 (HH) 0.5 - 1.9 mmol/L    Comment: CRITICAL RESULT CALLED TO, READ BACK BY AND VERIFIED WITH: RN R HARDY AT 1557 10/19/20 BY L BENFIELD Performed at [facility], [place] [address], [place], [place] [address]   Digoxin level     Status: Abnormal   Collection Time: 10/19/20  3:49 PM  Result Value Ref Range   Digoxin Level 0.9 (L) 1.0 - 2.0 ng/mL    Comment: Performed at [facility], [place] [address] [place], [place] [address]  Lipase, blood     Status: None   Collection Time: 10/19/20  3:49 PM  Result Value Ref Range   Lipase 49 11 - 51 U/L    Comment: Performed at [facility] [address], [place], [place] [address]  Iron and TIBC     Status: Abnormal   Collection Time: 10/19/20  3:49 PM   Result Value Ref Range   Iron 21 (L) 28 - 170 ug/dL   TIBC 246 (L)  250 - 450 ug/dL   Saturation Ratios 9 (L) 10.4 - 31.8 %   UIBC 225 ug/dL    Comment: Performed at [facility], [place] [address], [place], [place] [address]  Ferritin     Status: Abnormal   Collection Time: 10/19/20  3:49 PM  Result Value Ref Range   Ferritin 1,134 (H) 11 - 307 ng/mL    Comment: Performed at [facility], [place] [address], [place], [place] [address]  Vitamin B12     Status: Abnormal   Collection Time: 10/19/20  3:49 PM  Result Value Ref Range   Vitamin B-12 1,175 (H) 180 - 914 pg/mL    Comment: (NOTE) This assay is not validated for testing neonatal or myeloproliferative syndrome specimens for Vitamin B12 levels. Performed at [facility], [place] [address], [place], [place] [address]   Folate, serum, performed at [facility] lab     Status: None   Collection Time: 10/19/20  3:49 PM  Result Value Ref Range   Folate 16.8 >5.9 ng/mL    Comment: Performed at [facility], [place] [address], [place], [place] [address]  Culture, blood (routine x 2)     Status: None (Preliminary result)   Collection Time: 10/19/20  4:22 PM   Specimen: BLOOD  Result Value Ref Range   Specimen Description BLOOD SITE NOT SPECIFIED    Special Requests      BOTTLES DRAWN AEROBIC AND ANAEROBIC Blood Culture adequate volume   Culture      NO GROWTH < 24 HOURS Performed at [facility], [place] [address], [place], [place] [address]    Report Status PENDING   Culture, blood (routine x 2)     Status: None (Preliminary result)   Collection Time: 10/19/20  4:22 PM   Specimen: BLOOD  Result Value Ref Range   Specimen Description BLOOD SITE NOT SPECIFIED    Special Requests      BOTTLES DRAWN AEROBIC AND ANAEROBIC Blood Culture adequate volume   Culture      NO GROWTH < 24 HOURS Performed at [facility], [place] [address] [place], [place] [address]    Report Status PENDING   Urinalysis, Routine w  reflex microscopic     Status: Abnormal   Collection Time: 10/19/20  7:41 PM  Result Value Ref Range   Color, Urine YELLOW YELLOW   APPearance HAZY (A) CLEAR   Specific Gravity, Urine 1.015 1.005 - 1.030   pH 5.0 5.0 - 8.0   Glucose, UA NEGATIVE NEGATIVE mg/dL   Hgb urine dipstick NEGATIVE NEGATIVE   Bilirubin Urine NEGATIVE NEGATIVE   Ketones, ur NEGATIVE NEGATIVE mg/dL   Protein, ur 30 (A) NEGATIVE mg/dL   Nitrite NEGATIVE NEGATIVE   Leukocytes,Ua NEGATIVE NEGATIVE   RBC / HPF 0-5 0 - 5 RBC/hpf   WBC, UA 0-5 0 - 5 WBC/hpf   Bacteria, UA NONE SEEN NONE SEEN   Squamous Epithelial / LPF 0-5 0 - 5    Comment: Performed at [facility], [place] [address], [place], [place] [address]  Type and screen [place]     Status: None (Preliminary result)   Collection Time: 10/19/20  8:09 PM  Result Value Ref Range   ABO/RH(D) AB POS    Antibody Screen NEG    Sample Expiration 10/22/2020,2359    Antibody Identification ANTI A1    Unit Number [id number]    Blood Component Type  RBC LR PHER2    Unit division 00    Status of Unit ISSUED    Transfusion Status OK TO TRANSFUSE    Crossmatch Result COMPATIBLE    Unit Number [id number]    Blood Component Type RED CELLS,LR    Unit division 00    Status of Unit ALLOCATED    Transfusion Status OK TO TRANSFUSE    Crossmatch Result COMPATIBLE   Lactic acid, plasma     Status: None   Collection Time: 10/19/20  8:10 PM  Result Value Ref Range   Lactic Acid, Venous 1.8 0.5 - 1.9 mmol/L    Comment: Performed at [facility], [place] [address], [place], [place] [address]  CBC with Differential/Platelet     Status: Abnormal   Collection Time: 10/19/20  8:10 PM  Result Value Ref Range   WBC 25.5 (H) 4.0 - 10.5 K/uL   RBC 3.41 (L) 3.87 - 5.11 MIL/uL   Hemoglobin 7.0 (L) 12.0 - 15.0 g/dL    Comment: Reticulocyte Hemoglobin testing may be clinically indicated, consider ordering this additional test [id number]    HCT 24.1  (L) 36 - 46 %   MCV 70.7 (L) 80.0 - 100.0 fL   MCH 20.5 (L) 26.0 - 34.0 pg   MCHC 29.0 (L) 30.0 - 36.0 g/dL   RDW 21.1 (H) 11.5 - 15.5 %   Platelets 467 (H) 150 - 400 K/uL   nRBC 0.1 0.0 - 0.2 %   Neutrophils Relative % 89 %   Neutro Abs 22.9 (H) 1.7 - 7.7 K/uL   Lymphocytes Relative 4 %   Lymphs Abs 1.0 0.7 - 4.0 K/uL   Monocytes Relative 5 %   Monocytes Absolute 1.2 (H) 0.1 - 1.0 K/uL   Eosinophils Relative 0 %   Eosinophils Absolute 0.0 0.0 - 0.5 K/uL   Basophils Relative 0 %   Basophils Absolute 0.0 0.0 - 0.1 K/uL   Immature Granulocytes 2 %   Abs Immature Granulocytes 0.41 (H) 0.00 - 0.07 K/uL    Comment: Performed at [facility] [address], [place], [place] [address]  POC occult blood, ED     Status: None   Collection Time: 10/19/20  8:37 PM  Result Value Ref Range   Fecal Occult Bld NEGATIVE NEGATIVE  Prepare RBC (crossmatch)     Status: None   Collection Time: 10/19/20 10:02 PM  Result Value Ref Range   Order Confirmation      ORDER PROCESSED BY BLOOD BANK Performed at [facility], [place] [address], [place],  [address]   CBG monitoring, ED     Status: Abnormal   Collection Time: 10/19/20 11:02 PM  Result Value Ref Range   Glucose-Capillary 120 (H) 70 - 99 mg/dL    Comment: Glucose reference range applies only to samples taken after fasting for at least 8 hours.  Glucose, capillary     Status: Abnormal   Collection Time: 10/20/20  5:07 AM  Result Value Ref Range   Glucose-Capillary 121 (H) 70 - 99 mg/dL    Comment: Glucose reference range applies only to samples taken after fasting for at least 8 hours.  Glucose, capillary     Status: Abnormal   Collection Time: 10/20/20  7:47 AM  Result Value Ref Range   Glucose-Capillary 115 (H) 70 - 99 mg/dL    Comment: Glucose reference range applies only to samples taken after fasting for at least 8 hours.  Hepatic function panel  Status: Abnormal   Collection Time: 10/20/20  9:01 AM  Result Value  Ref Range   Total Protein 7.4 6.5 - 8.1 g/dL   Albumin 2.1 (L) 3.5 - 5.0 g/dL   AST 84 (H) 15 - 41 U/L   ALT 63 (H) 0 - 44 U/L   Alkaline Phosphatase 66 38 - 126 U/L   Total Bilirubin 1.5 (H) 0.3 - 1.2 mg/dL   Bilirubin, Direct 0.5 (H) 0.0 - 0.2 mg/dL   Indirect Bilirubin 1.0 (H) 0.3 - 0.9 mg/dL    Comment: Performed at [facility] [address], [place], [place] [address]  Comprehensive metabolic panel     Status: Abnormal   Collection Time: 10/20/20  9:01 AM  Result Value Ref Range   Sodium 138 135 - 145 mmol/L   Potassium 3.9 3.5 - 5.1 mmol/L   Chloride 102 98 - 111 mmol/L   CO2 23 22 - 32 mmol/L   Glucose, Bld 125 (H) 70 - 99 mg/dL    Comment: Glucose reference range applies only to samples taken after fasting for at least 8 hours.   BUN 31 (H) 8 - 23 mg/dL   Creatinine, Ser 1.90 (H) 0.44 - 1.00 mg/dL   Calcium 9.2 8.9 - 10.3 mg/dL   Total Protein 7.3 6.5 - 8.1 g/dL   Albumin 2.1 (L) 3.5 - 5.0 g/dL   AST 84 (H) 15 - 41 U/L   ALT 65 (H) 0 - 44 U/L   Alkaline Phosphatase 68 38 - 126 U/L   Total Bilirubin 1.3 (H) 0.3 - 1.2 mg/dL   GFR, Estimated 26 (L) >60 mL/min    Comment: (NOTE) Calculated using the CKD-EPI Creatinine Equation (2021)    Anion gap 13 5 - 15    Comment: Performed at [facility] [address] [place], [place] [address]  CBC     Status: Abnormal   Collection Time: 10/20/20  9:01 AM  Result Value Ref Range   WBC 24.2 (H) 4.0 - 10.5 K/uL   RBC 3.72 (L) 3.87 - 5.11 MIL/uL   Hemoglobin 7.9 (L) 12.0 - 15.0 g/dL    Comment: Reticulocyte Hemoglobin testing may be clinically indicated, consider ordering this additional test [id number]    HCT 26.3 (L) 36 - 46 %   MCV 70.7 (L) 80.0 - 100.0 fL   MCH 21.2 (L) 26.0 - 34.0 pg   MCHC 30.0 30.0 - 36.0 g/dL   RDW 21.0 (H) 11.5 - 15.5 %   Platelets 388 150 - 400 K/uL   nRBC 0.1 0.0 - 0.2 %    Comment: Performed at [facility], [place] [address], [place], [place] [address]  Hemoglobin and  hematocrit, blood     Status: Abnormal   Collection Time: 10/20/20  9:01 AM  Result Value Ref Range   Hemoglobin 7.8 (L) 12.0 - 15.0 g/dL   HCT 26.4 (L) 36 - 46 %    Comment: Performed at [facility] [address], [place], [place] [address]  Glucose, capillary     Status: Abnormal   Collection Time: 10/20/20 12:17 PM  Result Value Ref Range   Glucose-Capillary 123 (H) 70 - 99 mg/dL    Comment: Glucose reference range applies only to samples taken after fasting for at least 8 hours.    DG Chest 2 View  Result Date: 10/18/2020 CLINICAL DATA:  Aortic dissection. Aortic dissection diagnosed 09/13/2020. Shortness of breath and weakness. EXAM: CHEST -  2 VIEW COMPARISON:  Most recent chest radiograph 10/04/2020. FINDINGS: Median sternotomy similar cardiomegaly. Unchanged mediastinal contours with aortic tortuosity. Improvement in left lung aeration from prior exam with mild residual patchy opacity. Minimal fluid in the fissures without large subpulmonic effusion. Vascular congestion with possible pulmonary edema and Kerley B-lines at the bases. No pneumothorax. IMPRESSION: 1. Improving left lung aeration from prior exam, with mild residual patchy opacity, likely atelectasis. 2. Cardiomegaly with vascular congestion and possible pulmonary edema. 3. Stable mediastinal contours post aortic dissection repair. Electronically Signed   By: Keith Rake M.D.   On: 10/18/2020 16:39   CT Head Wo Contrast  Result Date: 10/19/2020 CLINICAL DATA:  Delirium. EXAM: CT HEAD WITHOUT CONTRAST TECHNIQUE: Contiguous axial images were obtained from the base of the skull through the vertex without intravenous contrast. COMPARISON:  CT head 09/13/2020, CT head 07/30/2003. FINDINGS: Brain: Cerebral ventricle sizes are concordant with the degree of cerebral volume loss. Patchy and confluent areas of decreased attenuation are noted throughout the deep and periventricular white matter of the cerebral hemispheres  bilaterally, compatible with chronic microvascular ischemic disease. No evidence of large-territorial acute infarction. No parenchymal hemorrhage. No mass lesion. No extra-axial collection. No mass effect or midline shift. No hydrocephalus. Basilar cisterns are patent. Vascular: No hyperdense vessel. Atherosclerotic calcifications are present within the cavernous internal carotid arteries. Skull: No acute fracture or focal lesion.Hyperostosis frontalis interna. Sinuses/Orbits: Right maxillary mucosal thickening. Left ethmoid sinus osteoma versus inspissated mucus similar to prior. Otherwise paranasal and mastoid air cells are clear. Bilateral lens replacement. Otherwise the orbits are unremarkable. Other: Dental hardware. IMPRESSION: No acute intracranial abnormality in a patient with microvascular ischemic changes. Electronically Signed   By: Iven Finn M.D.   On: 10/19/2020 18:25   DG Chest Port 1 View  Result Date: 10/19/2020 CLINICAL DATA:  Altered mental status EXAM: PORTABLE CHEST 1 VIEW COMPARISON:  10/18/2020 FINDINGS: Cardiac shadow is enlarged but stable. Postsurgical changes are noted. Tortuosity of the thoracic aorta is noted. The lungs are well aerated bilaterally. Mild basilar atelectasis on the left is seen. No new focal abnormality is noted. Previously seen vascular congestion has resolved. IMPRESSION: Left basilar atelectasis. Improved vascular congestion. Electronically Signed   By: Inez Catalina M.D.   On: 10/19/2020 15:36   CT Renal Stone Study  Result Date: 10/19/2020 CLINICAL DATA:  Bilateral flank pain EXAM: CT ABDOMEN AND PELVIS WITHOUT CONTRAST TECHNIQUE: Multidetector CT imaging of the abdomen and pelvis was performed following the standard protocol without IV contrast. COMPARISON:  September 13, 2020 FINDINGS: Lower chest: There is unchanged cardiomegaly with aortic knob calcifications. A tortuous descending aorta with basilar patchy airspace opacity at the posterior left lung  base. No hiatal hernia. Hepatobiliary: Although limited due to the lack of intravenous contrast, normal in appearance without gross focal abnormality. Layering calcified gallstones are present. Pancreas:  Unremarkable.  No surrounding inflammatory changes. Spleen: Normal in size. Although limited due to the lack of intravenous contrast, normal in appearance. Adrenals/Urinary Tract: Both adrenal glands appear normal. The kidneys and collecting system appear normal without evidence of urinary tract calculus or hydronephrosis. Bladder is unremarkable. Stomach/Bowel: The stomach and small bowel. There is a moderate amount stool throughout. There is mildly air and stool-filled distended cecum and transverse colon measuring up to 7.4 cm. No focal transition point or wall thickening is noted. Scattered colonic diverticula are noted. Vascular/Lymphatic: There are no enlarged abdominal or pelvic lymph nodes. Scattered aortic atherosclerosis with a tortuous appearance is. Reproductive: The  patient is status post hysterectomy. No adnexal masses or collections seen. Other: No evidence of abdominal wall mass or hernia. Musculoskeletal: No acute or significant osseous findings. IMPRESSION: Mildly dilated stool and air-filled cecum and transverse colon which could be due to ileus or partial small bowel obstruction. No focal transition point or wall thickening. Cholelithiasis Aortic Atherosclerosis (ICD10-I70.0). Electronically Signed   By: Prudencio Pair M.D.   On: 10/19/2020 18:02   US Abdomen Limited RUQ (LIVER/GB)  Result Date: 10/19/2020 CLINICAL DATA:  Right side abdominal pain EXAM: ULTRASOUND ABDOMEN LIMITED RIGHT UPPER QUADRANT COMPARISON:  02/29/2020 FINDINGS: Gallbladder: Multiple stones within the gallbladder. 9 mm stone in the region of the gallbladder neck is non mobile. Gallbladder wall normal thickness at 2.6 mm. No pericholecystic fluid. Common bile duct: Diameter: Normal caliber, 3 mm. Liver: Heterogeneous  echotexture. No mass or biliary ductal dilatation. Portal vein is patent on color Doppler imaging with normal direction of blood flow towards the liver. Other: None. IMPRESSION: Cholelithiasis. 9 mm gallstone non mobile in the gallbladder neck region. No sonographic evidence of acute cholecystitis. Electronically Signed   By: Rolm Baptise M.D.   On: 10/19/2020 20:20    Review of Systems  Constitutional: Positive for appetite change and fatigue.  HENT: Negative.   Eyes: Negative.   Respiratory: Positive for shortness of breath.   Cardiovascular: Negative for chest pain.  Gastrointestinal: Positive for abdominal pain. Negative for vomiting.  Endocrine: Negative.   Genitourinary: Negative.   Musculoskeletal: Positive for back pain. Negative for neck pain.  Neurological: Negative.   Hematological: Negative.   Psychiatric/Behavioral: Negative.    Blood pressure 136/78, pulse (!) 108, temperature 98.6 F (37 C), temperature source Oral, resp. rate 18, height 5' 4.5" (1.638 m), SpO2 99 %. Physical Exam Constitutional:      Appearance: She is ill-appearing.  HENT:     Head: Normocephalic and atraumatic.     Nose: Nose normal.     Mouth/Throat:     Mouth: Mucous membranes are dry.  Eyes:     General: No scleral icterus. Cardiovascular:     Rate and Rhythm: Tachycardia present.     Pulses: Normal pulses.  Pulmonary:     Effort: Pulmonary effort is normal.  Abdominal:     Tenderness: There is abdominal tenderness in the right upper quadrant.  Musculoskeletal:     Cervical back: Normal range of motion.  Skin:    General: Skin is warm.  Neurological:     General: No focal deficit present.  Psychiatric:        Behavior: Behavior normal.     Assessment/Plan: Acute cholecystitis-given elevated white count and physical exam findings, I recommend laparoscopic cholecystectomy when medically fit.  She has a complex medical history and requires cardiac clearance prior to surgical  intervention.  If she is unable to withstand a general anesthetic right now, recommend IR consultation for percutaneous drainage of the gallbladder with elective surgery down the road depending on her overall status.  Given her tachycardia and elevated white count as well as physical exam findings, there is concern for possible gangrenous cholecystitis so surgical management would be her best option if she is medically fit for it.  Continue IV fluids and antibiotics.  Will reevaluate in the morning once cardiology is seeing the patient to see if she is suitable for surgery.  If so, we will schedule for tomorrow for laparoscopic cholecystectomy.  She should be n.p.o. after midnight.  I discussed this with her daughter and  son on the phone and bedside.The procedure has been discussed with the patient. Operative and non operative treatments have been discussed. Risks of surgery include bleeding, infection,  Common bile duct injury,  Injury to the stomach,liver, colon,small intestine, abdominal wall,  Diaphragm,  Major blood vessels,  And the need for an open procedure.  Other risks include worsening of medical problems, death,  DVT and pulmonary embolism, and cardiovascular events.   Medical options have also been discussed. The patient has been informed of long term expectations of surgery and non surgical options,  The patient agrees to proceed.     Joyice Faster Nathanial Arrighi MD 10/20/2020, 12:26 PM

## 2020-10-21 DIAGNOSIS — I50810 Right heart failure, unspecified: Secondary | ICD-10-CM

## 2020-10-21 LAB — CBC WITH DIFFERENTIAL/PLATELET
Abs Immature Granulocytes: 0.44 10*3/uL — ABNORMAL HIGH (ref 0.00–0.07)
Basophils Absolute: 0 10*3/uL (ref 0.0–0.1)
Basophils Relative: 0 %
Eosinophils Absolute: 0 10*3/uL (ref 0.0–0.5)
Eosinophils Relative: 0 %
HCT: 25.7 % — ABNORMAL LOW (ref 36.0–46.0)
Hemoglobin: 7.5 g/dL — ABNORMAL LOW (ref 12.0–15.0)
Immature Granulocytes: 2 %
Lymphocytes Relative: 5 %
Lymphs Abs: 1.1 10*3/uL (ref 0.7–4.0)
MCH: 20.9 pg — ABNORMAL LOW (ref 26.0–34.0)
MCHC: 29.2 g/dL — ABNORMAL LOW (ref 30.0–36.0)
MCV: 71.6 fL — ABNORMAL LOW (ref 80.0–100.0)
Monocytes Absolute: 1 10*3/uL (ref 0.1–1.0)
Monocytes Relative: 4 %
Neutro Abs: 18.9 10*3/uL — ABNORMAL HIGH (ref 1.7–7.7)
Neutrophils Relative %: 89 %
Platelets: 329 10*3/uL (ref 150–400)
RBC: 3.59 MIL/uL — ABNORMAL LOW (ref 3.87–5.11)
RDW: 20.8 % — ABNORMAL HIGH (ref 11.5–15.5)
WBC: 21.4 10*3/uL — ABNORMAL HIGH (ref 4.0–10.5)
nRBC: 0.1 % (ref 0.0–0.2)

## 2020-10-21 LAB — GLUCOSE, CAPILLARY
Glucose-Capillary: 100 mg/dL — ABNORMAL HIGH (ref 70–99)
Glucose-Capillary: 79 mg/dL (ref 70–99)
Glucose-Capillary: 83 mg/dL (ref 70–99)
Glucose-Capillary: 89 mg/dL (ref 70–99)
Glucose-Capillary: 92 mg/dL (ref 70–99)
Glucose-Capillary: 99 mg/dL (ref 70–99)

## 2020-10-21 LAB — COMPREHENSIVE METABOLIC PANEL
ALT: 48 U/L — ABNORMAL HIGH (ref 0–44)
AST: 64 U/L — ABNORMAL HIGH (ref 15–41)
Albumin: 1.8 g/dL — ABNORMAL LOW (ref 3.5–5.0)
Alkaline Phosphatase: 72 U/L (ref 38–126)
Anion gap: 12 (ref 5–15)
BUN: 30 mg/dL — ABNORMAL HIGH (ref 8–23)
CO2: 23 mmol/L (ref 22–32)
Calcium: 9.1 mg/dL (ref 8.9–10.3)
Chloride: 105 mmol/L (ref 98–111)
Creatinine, Ser: 1.59 mg/dL — ABNORMAL HIGH (ref 0.44–1.00)
GFR, Estimated: 32 mL/min — ABNORMAL LOW (ref >60)
Glucose, Bld: 100 mg/dL — ABNORMAL HIGH (ref 70–99)
Potassium: 4.2 mmol/L (ref 3.5–5.1)
Sodium: 140 mmol/L (ref 135–145)
Total Bilirubin: 0.9 mg/dL (ref 0.3–1.2)
Total Protein: 6.6 g/dL (ref 6.5–8.1)

## 2020-10-21 LAB — URINE CULTURE: Culture: <10000 — AB

## 2020-10-21 NOTE — Progress Notes (Signed)
Subjective No acute events. Feeling well today. She actually denies any abdominal pain whatsoever at this point. Denies n/v. Passing flatus.  Objective: Vital signs in last 24 hours: Temp:  [97.6 F (36.4 C)-98.9 F (37.2 C)] 97.6 F (36.4 C) (11/07 0441) Pulse Rate:  [96-112] 96 (11/07 0441) Resp:  [18-29] 20 (11/07 0441) BP: (116-136)/(70-78) 125/71 (11/07 0441) SpO2:  [94 %-100 %] 100 % (11/07 0441)    Intake/Output from previous day: 11/06 0701 - 11/07 0700 In: 417 [IV Piggyback:417] Out: 100 [Urine:100] Intake/Output this shift: No intake/output data recorded.  Gen: NAD, comfortable CV: RRR Pulm: Normal work of breathing Abd: Soft, nontender throughout; nondistended; no rebound nor guarding. Negative Murphy's Ext: SCDs in place  Lab Results: CBC  Recent Labs    10/19/20 2010 10/20/20 0901  WBC 25.5* 24.2*  HGB 7.0* 7.9*  7.8*  HCT 24.1* 26.3*  26.4*  PLT 467* 388   BMET Recent Labs    10/19/20 1300 10/20/20 0901  NA 135 138  K 4.5 3.9  CL 99 102  CO2 24 23  GLUCOSE 153* 125*  BUN 24* 31*  CREATININE 1.86* 1.90*  CALCIUM 9.6 9.2   PT/INR No results for input(s): LABPROT, INR in the last 72 hours. ABG No results for input(s): PHART, HCO3 in the last 72 hours.  Invalid input(s): PCO2, PO2  Studies/Results:  Anti-infectives: Anti-infectives (From admission, onward)   Start     Dose/Rate Route Frequency Ordered Stop   10/21/20 1800  vancomycin (VANCOCIN) IVPB 1000 mg/200 mL premix  Status:  Discontinued        1,000 mg 200 mL/hr over 60 Minutes Intravenous Every 48 hours 10/19/20 2216 10/20/20 1406   10/20/20 2200  cefTRIAXone (ROCEPHIN) 2 g in sodium chloride 0.9 % 100 mL IVPB        2 g 200 mL/hr over 30 Minutes Intravenous Every 24 hours 10/20/20 1443     10/19/20 2300  ceFEPIme (MAXIPIME) 2 g in sodium chloride 0.9 % 100 mL IVPB  Status:  Discontinued        2 g 200 mL/hr over 30 Minutes Intravenous Every 24 hours 10/19/20 2216 10/20/20  1443   10/19/20 2145  metroNIDAZOLE (FLAGYL) IVPB 500 mg        500 mg 100 mL/hr over 60 Minutes Intravenous Every 8 hours 10/19/20 2137     10/19/20 1600  vancomycin (VANCOCIN) IVPB 1000 mg/200 mL premix        1,000 mg 200 mL/hr over 60 Minutes Intravenous  Once 10/19/20 1555 10/19/20 1914   10/19/20 1600  piperacillin-tazobactam (ZOSYN) IVPB 3.375 g        3.375 g 100 mL/hr over 30 Minutes Intravenous  Once 10/19/20 1555 10/19/20 1941       Assessment/Plan: Patient Active Problem List   Diagnosis Date Noted  . Encephalopathy acute 10/19/2020  . Acute renal failure superimposed on stage 3 chronic kidney disease ([place]) 10/19/2020  . SIRS (systemic inflammatory response syndrome) ([place]) 10/19/2020  . CKD (chronic kidney disease), stage III ([place])   . Diabetes mellitus ([place])   . Former tobacco use   . HTN (hypertension)   . Hyperlipidemia   . RBBB   . TIA (transient ischemic attack)   . Upper GI bleed   . Right heart failure ([place]) 09/20/2020  . Aortic dissection ([place]) 09/14/2020  . Aortic aneurysm ([place]) 09/13/2020  . Piriformis syndrome, left 09/29/2019  . CKD (chronic kidney disease) stage 3, GFR 30-59 ml/min ([facility]) 09/01/2019  .  Diabetes mellitus with renal complications ([place]) 41/42/3953  . GOUT 11/16/2009  . PARESTHESIA, HANDS 03/27/2009  . Pure hypercholesterolemia 06/25/2007  . Microcytic anemia 06/25/2007  . Essential hypertension 06/25/2007  . PUD (peptic ulcer disease) 06/25/2007  . Disorder resulting from impaired renal function 06/25/2007  . History of cardiovascular disorder 06/25/2007  . ANEMIA-NOS 06/25/2007   -She is completely pain free at this point. Cardiology input noted;  cleared for surgery although with higher risk. Given higher than 'average' risks and now pain free status, will plan to obtain HIDA scan before committing her to surgery/perc chole tube. Placed order today. -Continue IV abx for time being -Optimize as able from cardiac standpoint  today -Repeat labs in AM including LFTs; check PT/INR and PTT as well -We will follow with you   LOS: 2 days   Sharon Mt. Dema Severin, M.D. [facility] Use [email or web address] to contact on call provider

## 2020-10-21 NOTE — Progress Notes (Signed)
Progress Note  Patient Name: Danielle Atkins [facility] Date of Encounter: 10/21/2020  Primary Cardiologist: Pixie Casino, MD [place] Huxley from AHF  Subjective   85 y.o. female with 08/2020 Type 1 aortic dissection (with prolonged dissection prior to discovery) s/p repair with resuspension of the aortic valve complicated by RV failure/post-cardiotomy shock and AKI on CKD stage III, expected ABL anemia, moderate-severe emphysema on CT 09/2020, mild-moderate PAH, HTN, HLD, DM, prior TIA, gout, peptic ulcer disease, prior upper GIB, hypothyroidism, BiFB, remote tobacco abuse with residual RV dysfunction 10/20/20. Provider Note:  Daughter is a neonatologist in [place] MD  Patient improved from prior, denying abdominal pain.    Inpatient Medications    Scheduled Meds:  allopurinol  300 mg Oral Daily   vitamin C  1,000 mg Oral Daily   aspirin  325 mg Oral Daily   bisoprolol  5 mg Oral Daily   cholecalciferol  5,000 Units Oral Daily   digoxin  0.0625 mg Oral Daily   famotidine  10 mg Oral Daily   insulin aspart  0-9 Units Subcutaneous TID WC   latanoprost  1 drop Both Eyes QHS   levothyroxine  25 mcg Oral q AM   sodium chloride flush  3 mL Intravenous Q12H   traMADol  50 mg Oral Q12H   Continuous Infusions:  cefTRIAXone (ROCEPHIN)  IV 2 g (10/20/20 2358)   lactated ringers Stopped (10/20/20 1822)   metronidazole 500 mg (10/21/20 0545)   PRN Meds: acetaminophen **OR** acetaminophen, albuterol   Vital Signs    Vitals:   10/20/20 1949 10/21/20 0000 10/21/20 0441 10/21/20 0800  BP: 119/73 125/74 125/71 112/72  Pulse: (!) 110 (!) 103 96 94  Resp: 20 19 20 20   Temp: 98.9 F (37.2 C) 98.9 F (37.2 C) 97.6 F (36.4 C) 97.9 F (36.6 C)  TempSrc: Oral Oral Oral Oral  SpO2: 97% 97% 100% 96%  Height:        Intake/Output Summary (Last 24 hours) at 10/21/2020 1155 Last data filed at 10/21/2020 0800 Gross per 24 hour  Intake 537 ml  Output --  Net 537 ml   There were no vitals  filed for this visit.  Telemetry    SR one episode of SVT ~ 10:00 AM - Personally Reviewed  ECG     No new- Personally Reviewed  Physical Exam   GEN: No acute distress.   Neck: No JVD Cardiac: RRR, no murmurs, rubs, or gallops.  Respiratory: Clear to auscultation bilaterally. GI: Soft, nontender, non-distended  MS: No edema; No deformity. Neuro:  Nonfocal  Psych: Normal affect   Labs    Chemistry Recent Labs  Lab 10/19/20 1300 10/20/20 0901 10/21/20 0851  NA 135 138 140  K 4.5 3.9 4.2  CL 99 102 105  CO2 24 23 23   GLUCOSE 153* 125* 100*  BUN 24* 31* 30*  CREATININE 1.86* 1.90* 1.59*  CALCIUM 9.6 9.2 9.1  PROT  --  7.4  7.3 6.6  ALBUMIN  --  2.1*  2.1* 1.8*  AST  --  84*  84* 64*  ALT  --  63*  65* 48*  ALKPHOS  --  66  68 72  BILITOT  --  1.5*  1.3* 0.9  GFRNONAA 27* 26* 32*  ANIONGAP 12 13 12      Hematology Recent Labs  Lab 10/19/20 2010 10/20/20 0901 10/21/20 0851  WBC 25.5* 24.2* 21.4*  RBC 3.41* 3.72* 3.59*  HGB 7.0* 7.9*  7.8* 7.5*  HCT 24.1* 26.3*  26.4* 25.7*  MCV 70.7* 70.7* 71.6*  MCH 20.5* 21.2* 20.9*  MCHC 29.0* 30.0 29.2*  RDW 21.1* 21.0* 20.8*  PLT 467* 388 329    Cardiac EnzymesNo results for input(s): TROPONINI in the last 168 hours. No results for input(s): TROPIPOC in the last 168 hours.   BNP Recent Labs  Lab 10/19/20 1522  BNP 580.5*     DDimer No results for input(s): DDIMER in the last 168 hours.   Radiology    CT Head Wo Contrast  Result Date: 10/19/2020 CLINICAL DATA:  Delirium. EXAM: CT HEAD WITHOUT CONTRAST TECHNIQUE: Contiguous axial images were obtained from the base of the skull through the vertex without intravenous contrast. COMPARISON:  CT head 09/13/2020, CT head 07/30/2003. FINDINGS: Brain: Cerebral ventricle sizes are concordant with the degree of cerebral volume loss. Patchy and confluent areas of decreased attenuation are noted throughout the deep and periventricular white matter of the cerebral  hemispheres bilaterally, compatible with chronic microvascular ischemic disease. No evidence of large-territorial acute infarction. No parenchymal hemorrhage. No mass lesion. No extra-axial collection. No mass effect or midline shift. No hydrocephalus. Basilar cisterns are patent. Vascular: No hyperdense vessel. Atherosclerotic calcifications are present within the cavernous internal carotid arteries. Skull: No acute fracture or focal lesion.Hyperostosis frontalis interna. Sinuses/Orbits: Right maxillary mucosal thickening. Left ethmoid sinus osteoma versus inspissated mucus similar to prior. Otherwise paranasal and mastoid air cells are clear. Bilateral lens replacement. Otherwise the orbits are unremarkable. Other: Dental hardware. IMPRESSION: No acute intracranial abnormality in a patient with microvascular ischemic changes. Electronically Signed   By: Iven Finn M.D.   On: 10/19/2020 18:25   US RENAL  Result Date: 10/20/2020 CLINICAL DATA:  Inpatient.  Acute kidney injury. EXAM: RENAL / URINARY TRACT ULTRASOUND COMPLETE COMPARISON:  10/19/2020 abdominal sonogram. FINDINGS: Right Kidney: Renal measurements: 10.0 x 4.3 x 4.2 cm = volume: 96 mL. Echogenic right renal parenchyma, normal thickness. No hydronephrosis. Simple 1.5 x 1.5 x 1.7 cm lower right renal cyst. Left Kidney: Renal measurements: 10.2 x 6.1 x 5.1 cm = volume: 166 mL. Echogenic left renal parenchyma, normal thickness. No hydronephrosis. No renal mass. Bladder: Appears normal for degree of bladder distention. Other: None. IMPRESSION: 1. No hydronephrosis. 2. Echogenic kidneys, compatible with the reported history of nonspecific acute renal parenchymal disease. 3. Small simple right renal cyst. 4. Normal bladder. Electronically Signed   By: Ilona Sorrel M.D.   On: 10/20/2020 17:23   DG Chest Port 1 View  Result Date: 10/19/2020 CLINICAL DATA:  Altered mental status EXAM: PORTABLE CHEST 1 VIEW COMPARISON:  10/18/2020 FINDINGS: Cardiac  shadow is enlarged but stable. Postsurgical changes are noted. Tortuosity of the thoracic aorta is noted. The lungs are well aerated bilaterally. Mild basilar atelectasis on the left is seen. No new focal abnormality is noted. Previously seen vascular congestion has resolved. IMPRESSION: Left basilar atelectasis. Improved vascular congestion. Electronically Signed   By: Inez Catalina M.D.   On: 10/19/2020 15:36   CT Renal Stone Study  Result Date: 10/19/2020 CLINICAL DATA:  Bilateral flank pain EXAM: CT ABDOMEN AND PELVIS WITHOUT CONTRAST TECHNIQUE: Multidetector CT imaging of the abdomen and pelvis was performed following the standard protocol without IV contrast. COMPARISON:  September 13, 2020 FINDINGS: Lower chest: There is unchanged cardiomegaly with aortic knob calcifications. A tortuous descending aorta with basilar patchy airspace opacity at the posterior left lung base. No hiatal hernia. Hepatobiliary: Although limited due to the lack of intravenous contrast, normal in appearance  without gross focal abnormality. Layering calcified gallstones are present. Pancreas:  Unremarkable.  No surrounding inflammatory changes. Spleen: Normal in size. Although limited due to the lack of intravenous contrast, normal in appearance. Adrenals/Urinary Tract: Both adrenal glands appear normal. The kidneys and collecting system appear normal without evidence of urinary tract calculus or hydronephrosis. Bladder is unremarkable. Stomach/Bowel: The stomach and small bowel. There is a moderate amount stool throughout. There is mildly air and stool-filled distended cecum and transverse colon measuring up to 7.4 cm. No focal transition point or wall thickening is noted. Scattered colonic diverticula are noted. Vascular/Lymphatic: There are no enlarged abdominal or pelvic lymph nodes. Scattered aortic atherosclerosis with a tortuous appearance is. Reproductive: The patient is status post hysterectomy. No adnexal masses or  collections seen. Other: No evidence of abdominal wall mass or hernia. Musculoskeletal: No acute or significant osseous findings. IMPRESSION: Mildly dilated stool and air-filled cecum and transverse colon which could be due to ileus or partial small bowel obstruction. No focal transition point or wall thickening. Cholelithiasis Aortic Atherosclerosis (ICD10-I70.0). Electronically Signed   By: Prudencio Pair M.D.   On: 10/19/2020 18:02   ECHOCARDIOGRAM LIMITED  Result Date: 10/20/2020    ECHOCARDIOGRAM LIMITED REPORT   Patient Name:   Danielle Atkins Kalish Date of Exam: 10/20/2020 Medical Rec #:  [id number]           Height:       64.5 in Accession #:    [id number]          Weight:       146.4 lb Date of Birth:  08-Feb-1935            BSA:          1.723 m Patient Age:    45 years            BP:           116/74 mmHg Patient Gender: F                   HR:           110 bpm. Exam Location:  Inpatient Procedure: Limited Echo, Color Doppler and Cardiac Doppler Indications:     pre-operative exam  History:         Patient has prior history of Echocardiogram examinations, most                  recent 09/19/2020. Aortic dissection repair; Risk                  Factors:Hypertension, Dyslipidemia and Diabetes.  Sonographer:     Johny Chess Referring Phys:  [place] Diagnosing Phys: Ena Dawley MD IMPRESSIONS  1. Left ventricular ejection fraction, by estimation, is 55 to 60%. The left ventricle has normal function. Left ventricular diastolic parameters are consistent with Grade I diastolic dysfunction (impaired relaxation).  2. Right ventricular systolic function is moderately reduced. The right ventricular size is moderately enlarged. There is moderately elevated pulmonary artery systolic pressure. The estimated right ventricular systolic pressure is 19.5 mmHg.  3. Left atrial size was moderately dilated.  4. Right atrial size was moderately dilated.  5. Tricuspid valve regurgitation is moderate to severe.   6. Aortic valve regurgitation is moderate. FINDINGS  Left Ventricle: Left ventricular ejection fraction, by estimation, is 55 to 60%. The left ventricle has normal function. Left ventricular diastolic parameters are consistent with Grade I diastolic dysfunction (impaired relaxation). Right Ventricle: The right ventricular  size is moderately enlarged. Right ventricular systolic function is moderately reduced. There is moderately elevated pulmonary artery systolic pressure. The tricuspid regurgitant velocity is 3.46 m/s, and with an assumed right atrial pressure of 3 mmHg, the estimated right ventricular systolic pressure is 42.5 mmHg. Left Atrium: Left atrial size was moderately dilated. Right Atrium: Right atrial size was moderately dilated. Tricuspid Valve: Tricuspid valve regurgitation is moderate to severe. Aortic Valve: Aortic valve regurgitation is moderate. Aortic regurgitation PHT measures 275 msec. LEFT VENTRICLE PLAX 2D LVIDd:         3.80 cm LVIDs:         2.60 cm LV PW:         1.20 cm LV IVS:        0.90 cm  LV Volumes (MOD) LV vol d, MOD A2C: 39.1 ml LV vol s, MOD A2C: 22.7 ml LV SV MOD A2C:     16.4 ml IVC IVC diam: 1.50 cm LEFT ATRIUM         Index LA diam:    4.50 cm 2.61 cm/m  AORTIC VALVE LVOT Vmax:   158.00 cm/s LVOT Vmean:  102.000 cm/s LVOT VTI:    0.242 m AI PHT:      275 msec MR Peak grad: 119.7 mmHg    TRICUSPID VALVE MR Mean grad: 75.0 mmHg     TR Peak grad:   47.9 mmHg MR Vmax:      547.00 cm/s   TR Vmax:        346.00 cm/s MR Vmean:     391.0 cm/s MV E velocity: 69.20 cm/s   SHUNTS MV A velocity: 148.00 cm/s  Systemic VTI: 0.24 m MV E/A ratio:  0.47 Ena Dawley MD Electronically signed by Ena Dawley MD Signature Date/Time: 10/20/2020/4:38:28 PM    Final (Updated)    US Abdomen Limited RUQ (LIVER/GB)  Result Date: 10/19/2020 CLINICAL DATA:  Right side abdominal pain EXAM: ULTRASOUND ABDOMEN LIMITED RIGHT UPPER QUADRANT COMPARISON:  02/29/2020 FINDINGS: Gallbladder: Multiple  stones within the gallbladder. 9 mm stone in the region of the gallbladder neck is non mobile. Gallbladder wall normal thickness at 2.6 mm. No pericholecystic fluid. Common bile duct: Diameter: Normal caliber, 3 mm. Liver: Heterogeneous echotexture. No mass or biliary ductal dilatation. Portal vein is patent on color Doppler imaging with normal direction of blood flow towards the liver. Other: None. IMPRESSION: Cholelithiasis. 9 mm gallstone non mobile in the gallbladder neck region. No sonographic evidence of acute cholecystitis. Electronically Signed   By: Rolm Baptise M.D.   On: 10/19/2020 20:20    Cardiac Studies   Personally reviewed Echo:  Normal LV Function; persistent RV dysfunction (moderate)  Patient Profile     85 y.o. female Aortic Dissection and RHF who presentes for cholecystectomy  Assessment & Plan    Right heart failure with BiFascular Block Recent type 1 aortic dissection AKI on CKD (improving) - euvolemic presently; holding home lasix - continue ASA when OK from surgical perspective - holding statin until resolution of transaminitis - continue low dose BB - continue digoxin; monitor kidney function  Worsening anemia superimposed on recent ABL anemia - transfused 1 U PRBC - IM following closely; ASA when able to tolerate and with surgery's OK  Preoperative Risk Stratification for Cholecystectomy Leukocytosis, flank pain, cholelithiasis with clinical picture concerning for gangrenous cholecystitis  - surgery/IM involved Preoperative Risk Assessment - The Revised Cardiac Risk Index = 2 (CHF equivalent or RV dysfunction, CVA) DM on insulin, Scr >2), which  equates to 2=6.6%: moderate estimated risk of perioperative myocardial infarction, pulmonary edema, ventricular fibrillation, cardiac arrest, or complete heart block.  - No further cardiac testing is recommended prior to surgery.  - It could be reasonable to proceed to surgery; have discussed with family about the  risks and benefits. - Patient is presently optimized for surgery on reasonable therapy. - Our service is available as needed in the peri-operative period.    For questions or updates, please contact [place] Please consult [email or web address] for contact info under Cardiology/STEMI.      Signed, Werner Lean, MD  10/21/2020, 11:55 AM

## 2020-10-21 NOTE — Progress Notes (Addendum)
PROGRESS NOTE    Danielle Atkins  DQQ:[id number] DOB: 1935-12-14 DOA: 10/19/2020 PCP: [name], [name], MD  Brief Narrative:   85 year old black female Recent emergent repair type I aortic dissection 09/13/2020-at that admission developed acute kidney injury had post cardiotomy shock History of prior TIA, DM TY 2, HTN, HLD Admitted with 2 days of decreased activity confusion abdominal pain-unable to participate in HPI Admitted for metabolic encephalopathy blood pressures 194 systolic creatinine up from 1.2-1.8 hemoglobin 7.5 down from 9.2 lactic acid 2.2 BNP 500, WBC 30  Not been herself and sleeps a lot since the past 1-2 weeks and has lost some weight and felt like comething "exploded" in her side--has had pain on the R flank  Assessment & Plan:   Principal Problem:   ANEMIA-NOS Active Problems:   Essential hypertension   Right heart failure (HCC)   Diabetes mellitus ([place])   TIA (transient ischemic attack)   Encephalopathy acute   Acute renal failure superimposed on stage 3 chronic kidney disease (HCC)   SIRS (systemic inflammatory response syndrome) ([place])   1. Acute metabolic encephalopathy?  Infection a. Patient much improved from admit b. Hold nephrotoxins 2. ?  SBO a. Imaging was suggestive of the same but is tolerating clears without issues b. Defer graduation of diet for now. 3. Probable sepsis on admission from choelcystitis a. -lactic acid was 2.2 admission b. dc'd vancomycin--cont ceftirax Flagyl c. Abdominal ultrasound showed nonmobile 9 mm stone in gallbladder neck--HIDA pending d. Blood cultures from 11/5X2 are pending, urine culture from 11/5 pending 4. Anemia of unclear etiology?  Blood loss a. Baseline hemoglobin 8-9 currently in the seven range b. transfused 1 u PRBC 11/6 c. Iron studies show decreased saturations and low iron so we will need some Feraheme d. Monitor for acute bleed with Hemoccult 5. Acute kidney injury superimposed on CKD  3 a. Kidney function improved admission baseline 22/1.2 b. Continue LR 75 cc/h monitor trends and if retaining may need Foley c. Renal US is nonspecific 6. Chronic right heart failure after type I aortic dissection a. Continue bisoprolol with increased dose to 5 mg digoxin 0.0625 b.  monitor on telemetry c. A.m. magnesium EDC 7. DM TY 2 a. Changed to 3 times daily with meals- b. CBG ranging from one  90-100's  DVT prophylaxis: SCDs Code Status: Full Family Communication: discussed at bedisde with Suhayla Chisom  [phone] Disposition:   Status is: Inpatient  Remains inpatient appropriate because:Hemodynamically unstable, Persistent severe electrolyte disturbances and Ongoing active pain requiring inpatient pain management   Dispo: The patient is from: Home              Anticipated d/c is to: SNF              Anticipated d/c date is: 3 days              Patient currently is not medically stable to d/c.    Consultants:   None yet  Procedures: None  Antimicrobials: As above   Subjective: Awake coherent is able to tell me where she is slightly sleepy no chest pain no fever No cough no cold no diarrhea  Objective: Vitals:   10/20/20 1949 10/21/20 0000 10/21/20 0441 10/21/20 0800  BP: 119/73 125/74 125/71 112/72  Pulse: (!) 110 (!) 103 96 94  Resp: 20 19 20 20   Temp: 98.9 F (37.2 C) 98.9 F (37.2 C) 97.6 F (36.4 C) 97.9 F (36.6 C)  TempSrc: Oral Oral Oral Oral  SpO2: 97% 97% 100% 96%  Height:        Intake/Output Summary (Last 24 hours) at 10/21/2020 0902 Last data filed at 10/21/2020 0800 Gross per 24 hour  Intake 537 ml  Output 100 ml  Net 437 ml   There were no vitals filed for this visit.  Examination:  Much more coherent in nadno focal dficit eomi ncat more alert cta b no added sound s1 s 2 mild m abd soft nt no RUQ pains Neuro intact to pwer full assessment deferred  Data Reviewed: I have personally reviewed following labs and imaging  studies BUN/creatinine 24/1.8-->31/1.9-->30/1.5 LFTs slightly elevated AST/ALT 84/63-->64/48 White count down from 30-->24.2-->21 Hemoglobin 7.9-->7.5 Platelet 388  Radiology Studies: CT Head Wo Contrast  Result Date: 10/19/2020 CLINICAL DATA:  Delirium. EXAM: CT HEAD WITHOUT CONTRAST TECHNIQUE: Contiguous axial images were obtained from the base of the skull through the vertex without intravenous contrast. COMPARISON:  CT head 09/13/2020, CT head 07/30/2003. FINDINGS: Brain: Cerebral ventricle sizes are concordant with the degree of cerebral volume loss. Patchy and confluent areas of decreased attenuation are noted throughout the deep and periventricular white matter of the cerebral hemispheres bilaterally, compatible with chronic microvascular ischemic disease. No evidence of large-territorial acute infarction. No parenchymal hemorrhage. No mass lesion. No extra-axial collection. No mass effect or midline shift. No hydrocephalus. Basilar cisterns are patent. Vascular: No hyperdense vessel. Atherosclerotic calcifications are present within the cavernous internal carotid arteries. Skull: No acute fracture or focal lesion.Hyperostosis frontalis interna. Sinuses/Orbits: Right maxillary mucosal thickening. Left ethmoid sinus osteoma versus inspissated mucus similar to prior. Otherwise paranasal and mastoid air cells are clear. Bilateral lens replacement. Otherwise the orbits are unremarkable. Other: Dental hardware. IMPRESSION: No acute intracranial abnormality in a patient with microvascular ischemic changes. Electronically Signed   By: Iven Finn M.D.   On: 10/19/2020 18:25   US RENAL  Result Date: 10/20/2020 CLINICAL DATA:  Inpatient.  Acute kidney injury. EXAM: RENAL / URINARY TRACT ULTRASOUND COMPLETE COMPARISON:  10/19/2020 abdominal sonogram. FINDINGS: Right Kidney: Renal measurements: 10.0 x 4.3 x 4.2 cm = volume: 96 mL. Echogenic right renal parenchyma, normal thickness. No hydronephrosis.  Simple 1.5 x 1.5 x 1.7 cm lower right renal cyst. Left Kidney: Renal measurements: 10.2 x 6.1 x 5.1 cm = volume: 166 mL. Echogenic left renal parenchyma, normal thickness. No hydronephrosis. No renal mass. Bladder: Appears normal for degree of bladder distention. Other: None. IMPRESSION: 1. No hydronephrosis. 2. Echogenic kidneys, compatible with the reported history of nonspecific acute renal parenchymal disease. 3. Small simple right renal cyst. 4. Normal bladder. Electronically Signed   By: Ilona Sorrel M.D.   On: 10/20/2020 17:23   DG Chest Port 1 View  Result Date: 10/19/2020 CLINICAL DATA:  Altered mental status EXAM: PORTABLE CHEST 1 VIEW COMPARISON:  10/18/2020 FINDINGS: Cardiac shadow is enlarged but stable. Postsurgical changes are noted. Tortuosity of the thoracic aorta is noted. The lungs are well aerated bilaterally. Mild basilar atelectasis on the left is seen. No new focal abnormality is noted. Previously seen vascular congestion has resolved. IMPRESSION: Left basilar atelectasis. Improved vascular congestion. Electronically Signed   By: Inez Catalina M.D.   On: 10/19/2020 15:36   CT Renal Stone Study  Result Date: 10/19/2020 CLINICAL DATA:  Bilateral flank pain EXAM: CT ABDOMEN AND PELVIS WITHOUT CONTRAST TECHNIQUE: Multidetector CT imaging of the abdomen and pelvis was performed following the standard protocol without IV contrast. COMPARISON:  September 13, 2020 FINDINGS: Lower chest: There is unchanged cardiomegaly with  aortic knob calcifications. A tortuous descending aorta with basilar patchy airspace opacity at the posterior left lung base. No hiatal hernia. Hepatobiliary: Although limited due to the lack of intravenous contrast, normal in appearance without gross focal abnormality. Layering calcified gallstones are present. Pancreas:  Unremarkable.  No surrounding inflammatory changes. Spleen: Normal in size. Although limited due to the lack of intravenous contrast, normal in appearance.  Adrenals/Urinary Tract: Both adrenal glands appear normal. The kidneys and collecting system appear normal without evidence of urinary tract calculus or hydronephrosis. Bladder is unremarkable. Stomach/Bowel: The stomach and small bowel. There is a moderate amount stool throughout. There is mildly air and stool-filled distended cecum and transverse colon measuring up to 7.4 cm. No focal transition point or wall thickening is noted. Scattered colonic diverticula are noted. Vascular/Lymphatic: There are no enlarged abdominal or pelvic lymph nodes. Scattered aortic atherosclerosis with a tortuous appearance is. Reproductive: The patient is status post hysterectomy. No adnexal masses or collections seen. Other: No evidence of abdominal wall mass or hernia. Musculoskeletal: No acute or significant osseous findings. IMPRESSION: Mildly dilated stool and air-filled cecum and transverse colon which could be due to ileus or partial small bowel obstruction. No focal transition point or wall thickening. Cholelithiasis Aortic Atherosclerosis (ICD10-I70.0). Electronically Signed   By: Prudencio Pair M.D.   On: 10/19/2020 18:02   ECHOCARDIOGRAM LIMITED  Result Date: 10/20/2020    ECHOCARDIOGRAM LIMITED REPORT   Patient Name:   AKEYLAH HENDEL Zuidema Date of Exam: 10/20/2020 Medical Rec #:  [id number]           Height:       64.5 in Accession #:    [id number]          Weight:       146.4 lb Date of Birth:  [birth year]            BSA:          1.723 m Patient Age:    67 years            BP:           116/74 mmHg Patient Gender: F                   HR:           110 bpm. Exam Location:  Inpatient Procedure: Limited Echo, Color Doppler and Cardiac Doppler Indications:     pre-operative exam  History:         Patient has prior history of Echocardiogram examinations, most                  recent 09/19/2020. Aortic dissection repair; Risk                  Factors:Hypertension, Dyslipidemia and Diabetes.  Sonographer:     Johny Chess  Referring Phys:  [place] Diagnosing Phys: Ena Dawley MD IMPRESSIONS  1. Left ventricular ejection fraction, by estimation, is 55 to 60%. The left ventricle has normal function. Left ventricular diastolic parameters are consistent with Grade I diastolic dysfunction (impaired relaxation).  2. Right ventricular systolic function is moderately reduced. The right ventricular size is moderately enlarged. There is moderately elevated pulmonary artery systolic pressure. The estimated right ventricular systolic pressure is 87.8 mmHg.  3. Left atrial size was moderately dilated.  4. Right atrial size was moderately dilated.  5. Tricuspid valve regurgitation is moderate to severe.  6. Aortic valve regurgitation is moderate. FINDINGS  Left Ventricle:  Left ventricular ejection fraction, by estimation, is 55 to 60%. The left ventricle has normal function. Left ventricular diastolic parameters are consistent with Grade I diastolic dysfunction (impaired relaxation). Right Ventricle: The right ventricular size is moderately enlarged. Right ventricular systolic function is moderately reduced. There is moderately elevated pulmonary artery systolic pressure. The tricuspid regurgitant velocity is 3.46 m/s, and with an assumed right atrial pressure of 3 mmHg, the estimated right ventricular systolic pressure is 50.0 mmHg. Left Atrium: Left atrial size was moderately dilated. Right Atrium: Right atrial size was moderately dilated. Tricuspid Valve: Tricuspid valve regurgitation is moderate to severe. Aortic Valve: Aortic valve regurgitation is moderate. Aortic regurgitation PHT measures 275 msec. LEFT VENTRICLE PLAX 2D LVIDd:         3.80 cm LVIDs:         2.60 cm LV PW:         1.20 cm LV IVS:        0.90 cm  LV Volumes (MOD) LV vol d, MOD A2C: 39.1 ml LV vol s, MOD A2C: 22.7 ml LV SV MOD A2C:     16.4 ml IVC IVC diam: 1.50 cm LEFT ATRIUM         Index LA diam:    4.50 cm 2.61 cm/m  AORTIC VALVE LVOT Vmax:   158.00 cm/s  LVOT Vmean:  102.000 cm/s LVOT VTI:    0.242 m AI PHT:      275 msec MR Peak grad: 119.7 mmHg    TRICUSPID VALVE MR Mean grad: 75.0 mmHg     TR Peak grad:   47.9 mmHg MR Vmax:      547.00 cm/s   TR Vmax:        346.00 cm/s MR Vmean:     391.0 cm/s MV E velocity: 69.20 cm/s   SHUNTS MV A velocity: 148.00 cm/s  Systemic VTI: 0.24 m MV E/A ratio:  0.47 Ena Dawley MD Electronically signed by Ena Dawley MD Signature Date/Time: 10/20/2020/4:38:28 PM    Final (Updated)    US Abdomen Limited RUQ (LIVER/GB)  Result Date: 10/19/2020 CLINICAL DATA:  Right side abdominal pain EXAM: ULTRASOUND ABDOMEN LIMITED RIGHT UPPER QUADRANT COMPARISON:  02/29/2020 FINDINGS: Gallbladder: Multiple stones within the gallbladder. 9 mm stone in the region of the gallbladder neck is non mobile. Gallbladder wall normal thickness at 2.6 mm. No pericholecystic fluid. Common bile duct: Diameter: Normal caliber, 3 mm. Liver: Heterogeneous echotexture. No mass or biliary ductal dilatation. Portal vein is patent on color Doppler imaging with normal direction of blood flow towards the liver. Other: None. IMPRESSION: Cholelithiasis. 9 mm gallstone non mobile in the gallbladder neck region. No sonographic evidence of acute cholecystitis. Electronically Signed   By: Rolm Baptise M.D.   On: 10/19/2020 20:20     Scheduled Meds: . allopurinol  300 mg Oral Daily  . vitamin C  1,000 mg Oral Daily  . aspirin  325 mg Oral Daily  . bisoprolol  5 mg Oral Daily  . cholecalciferol  5,000 Units Oral Daily  . digoxin  0.0625 mg Oral Daily  . famotidine  10 mg Oral Daily  . insulin aspart  0-9 Units Subcutaneous TID WC  . latanoprost  1 drop Both Eyes QHS  . levothyroxine  25 mcg Oral q AM  . sodium chloride flush  3 mL Intravenous Q12H  . traMADol  50 mg Oral Q12H   Continuous Infusions: . cefTRIAXone (ROCEPHIN)  IV 2 g (10/20/20 2358)  . lactated ringers Stopped (  10/20/20 1822)  . metronidazole 500 mg (10/21/20 0545)     LOS: 2  days    Time spent: Lam[place], MD [facility] To contact the attending provider between 7A-7P or the covering provider during after hours 7P-7A, please log into the web site [email or web address] and access using universal [place] password for that web site. If you do not have the password, please call the hospital operator.  10/21/2020, 9:02 AM

## 2020-10-22 ENCOUNTER — Inpatient Hospital Stay (HOSPITAL_COMMUNITY): Payer: Medicare HMO

## 2020-10-22 DIAGNOSIS — I50813 Acute on chronic right heart failure: Secondary | ICD-10-CM

## 2020-10-22 DIAGNOSIS — D649 Anemia, unspecified: Secondary | ICD-10-CM

## 2020-10-22 LAB — CBC WITH DIFFERENTIAL/PLATELET
Abs Immature Granulocytes: 0.2 10*3/uL — ABNORMAL HIGH (ref 0.00–0.07)
Basophils Absolute: 0 10*3/uL (ref 0.0–0.1)
Basophils Relative: 0 %
Eosinophils Absolute: 0.1 10*3/uL (ref 0.0–0.5)
Eosinophils Relative: 0 %
HCT: 24.8 % — ABNORMAL LOW (ref 36.0–46.0)
Hemoglobin: 7.3 g/dL — ABNORMAL LOW (ref 12.0–15.0)
Immature Granulocytes: 1 %
Lymphocytes Relative: 7 %
Lymphs Abs: 1.1 10*3/uL (ref 0.7–4.0)
MCH: 21.1 pg — ABNORMAL LOW (ref 26.0–34.0)
MCHC: 29.4 g/dL — ABNORMAL LOW (ref 30.0–36.0)
MCV: 71.7 fL — ABNORMAL LOW (ref 80.0–100.0)
Monocytes Absolute: 0.8 10*3/uL (ref 0.1–1.0)
Monocytes Relative: 5 %
Neutro Abs: 13.9 10*3/uL — ABNORMAL HIGH (ref 1.7–7.7)
Neutrophils Relative %: 87 %
Platelets: 320 10*3/uL (ref 150–400)
RBC: 3.46 MIL/uL — ABNORMAL LOW (ref 3.87–5.11)
RDW: 21.2 % — ABNORMAL HIGH (ref 11.5–15.5)
WBC: 16 10*3/uL — ABNORMAL HIGH (ref 4.0–10.5)
nRBC: 0.2 % (ref 0.0–0.2)

## 2020-10-22 LAB — GLUCOSE, CAPILLARY
Glucose-Capillary: 147 mg/dL — ABNORMAL HIGH (ref 70–99)
Glucose-Capillary: 185 mg/dL — ABNORMAL HIGH (ref 70–99)
Glucose-Capillary: 77 mg/dL (ref 70–99)
Glucose-Capillary: 80 mg/dL (ref 70–99)
Glucose-Capillary: 88 mg/dL (ref 70–99)
Glucose-Capillary: 96 mg/dL (ref 70–99)

## 2020-10-22 LAB — COMPREHENSIVE METABOLIC PANEL
ALT: 39 U/L (ref 0–44)
AST: 53 U/L — ABNORMAL HIGH (ref 15–41)
Albumin: 1.8 g/dL — ABNORMAL LOW (ref 3.5–5.0)
Alkaline Phosphatase: 63 U/L (ref 38–126)
Anion gap: 10 (ref 5–15)
BUN: 30 mg/dL — ABNORMAL HIGH (ref 8–23)
CO2: 25 mmol/L (ref 22–32)
Calcium: 9 mg/dL (ref 8.9–10.3)
Chloride: 105 mmol/L (ref 98–111)
Creatinine, Ser: 1.64 mg/dL — ABNORMAL HIGH (ref 0.44–1.00)
GFR, Estimated: 31 mL/min — ABNORMAL LOW (ref >60)
Glucose, Bld: 87 mg/dL (ref 70–99)
Potassium: 3.4 mmol/L — ABNORMAL LOW (ref 3.5–5.1)
Sodium: 140 mmol/L (ref 135–145)
Total Bilirubin: 0.9 mg/dL (ref 0.3–1.2)
Total Protein: 6.4 g/dL — ABNORMAL LOW (ref 6.5–8.1)

## 2020-10-22 MED ORDER — TECHNETIUM TC 99M MEBROFENIN IV KIT
5.1000 | PACK | Freq: Once | INTRAVENOUS | Status: AC | PRN
  Administered 2020-10-22: 5.1 via INTRAVENOUS

## 2020-10-22 MED ORDER — INSULIN ASPART 100 UNIT/ML ~~LOC~~ SOLN
0.0000 [IU] | Freq: Two times a day (BID) | SUBCUTANEOUS | Status: DC
  Administered 2020-10-22: 1 [IU] via SUBCUTANEOUS

## 2020-10-22 NOTE — Progress Notes (Signed)
[facility] Progress Note     Subjective: CC-  Daughter at bedside. Complaining mostly of back pain today. States that she does still have some intermittent abdominal pain. She points to her upper abdomen. Some nausea with broth yesterday, no emesis. Labs are pending. Afebrile.  Going for HIDA today.  Objective: Vital signs in last 24 hours: Temp:  [97.7 F (36.5 C)-98.9 F (37.2 C)] 97.7 F (36.5 C) (11/08 0409) Pulse Rate:  [88-98] 98 (11/08 0750) Resp:  [16-25] 19 (11/08 0750) BP: (119-146)/(73-81) 146/81 (11/08 0750) SpO2:  [95 %-99 %] 98 % (11/08 0409) Weight:  [66.5 kg] 66.5 kg (11/08 0409) Last BM Date: 10/15/20  Intake/Output from previous day: 11/07 0701 - 11/08 0700 In: 1260.4 [P.O.:370; I.V.:390.4; IV Piggyback:500] Out: 500 [Urine:500] Intake/Output this shift: No intake/output data recorded.  PE: Gen:  Alert, NAD, pleasant Card:  RRR Pulm:  CTAB, no W/R/R, rate and effort normal Abd: Soft, nondistended, mild subjective RUQ/ epigastric/ and LUQ TTP without rebound or guarding, +BS, negative Murphy sign Skin: no rashes noted, warm and dry  Lab Results:  Recent Labs    10/20/20 0901 10/21/20 0851  WBC 24.2* 21.4*  HGB 7.9*  7.8* 7.5*  HCT 26.3*  26.4* 25.7*  PLT 388 329   BMET Recent Labs    10/20/20 0901 10/21/20 0851  NA 138 140  K 3.9 4.2  CL 102 105  CO2 23 23  GLUCOSE 125* 100*  BUN 31* 30*  CREATININE 1.90* 1.59*  CALCIUM 9.2 9.1   PT/INR No results for input(s): LABPROT, INR in the last 72 hours. CMP     Component Value Date/Time   NA 140 10/21/2020 0851   K 4.2 10/21/2020 0851   CL 105 10/21/2020 0851   CO2 23 10/21/2020 0851   GLUCOSE 100 (H) 10/21/2020 0851   BUN 30 (H) 10/21/2020 0851   CREATININE 1.59 (H) 10/21/2020 0851   CALCIUM 9.1 10/21/2020 0851   PROT 6.6 10/21/2020 0851   ALBUMIN 1.8 (L) 10/21/2020 0851   AST 64 (H) 10/21/2020 0851   ALT 48 (H) 10/21/2020 0851   ALKPHOS 72 10/21/2020 0851    BILITOT 0.9 10/21/2020 0851   GFRNONAA 32 (L) 10/21/2020 0851   GFRAA 44 (L) 09/18/2020 0405   Lipase     Component Value Date/Time   LIPASE 49 10/19/2020 1549       Studies/Results: US RENAL  Result Date: 10/20/2020 CLINICAL DATA:  Inpatient.  Acute kidney injury. EXAM: RENAL / URINARY TRACT ULTRASOUND COMPLETE COMPARISON:  10/19/2020 abdominal sonogram. FINDINGS: Right Kidney: Renal measurements: 10.0 x 4.3 x 4.2 cm = volume: 96 mL. Echogenic right renal parenchyma, normal thickness. No hydronephrosis. Simple 1.5 x 1.5 x 1.7 cm lower right renal cyst. Left Kidney: Renal measurements: 10.2 x 6.1 x 5.1 cm = volume: 166 mL. Echogenic left renal parenchyma, normal thickness. No hydronephrosis. No renal mass. Bladder: Appears normal for degree of bladder distention. Other: None. IMPRESSION: 1. No hydronephrosis. 2. Echogenic kidneys, compatible with the reported history of nonspecific acute renal parenchymal disease. 3. Small simple right renal cyst. 4. Normal bladder. Electronically Signed   By: Ilona Sorrel M.D.   On: 10/20/2020 17:23   ECHOCARDIOGRAM LIMITED  Result Date: 10/20/2020    ECHOCARDIOGRAM LIMITED REPORT   Patient Name:   SHAHED YEOMAN Wagman Date of Exam: 10/20/2020 Medical Rec #:  [id number]           Height:       64.5 in Accession #:  [id number]          Weight:       146.4 lb Date of Birth:  1935/01/07            BSA:          1.723 m Patient Age:    19 years            BP:           116/74 mmHg Patient Gender: F                   HR:           110 bpm. Exam Location:  Inpatient Procedure: Limited Echo, Color Doppler and Cardiac Doppler Indications:     pre-operative exam  History:         Patient has prior history of Echocardiogram examinations, most                  recent 09/19/2020. Aortic dissection repair; Risk                  Factors:Hypertension, Dyslipidemia and Diabetes.  Sonographer:     Johny Chess Referring Phys:  [place] Diagnosing Phys: Ena Dawley  MD IMPRESSIONS  1. Left ventricular ejection fraction, by estimation, is 55 to 60%. The left ventricle has normal function. Left ventricular diastolic parameters are consistent with Grade I diastolic dysfunction (impaired relaxation).  2. Right ventricular systolic function is moderately reduced. The right ventricular size is moderately enlarged. There is moderately elevated pulmonary artery systolic pressure. The estimated right ventricular systolic pressure is 26.9 mmHg.  3. Left atrial size was moderately dilated.  4. Right atrial size was moderately dilated.  5. Tricuspid valve regurgitation is moderate to severe.  6. Aortic valve regurgitation is moderate. FINDINGS  Left Ventricle: Left ventricular ejection fraction, by estimation, is 55 to 60%. The left ventricle has normal function. Left ventricular diastolic parameters are consistent with Grade I diastolic dysfunction (impaired relaxation). Right Ventricle: The right ventricular size is moderately enlarged. Right ventricular systolic function is moderately reduced. There is moderately elevated pulmonary artery systolic pressure. The tricuspid regurgitant velocity is 3.46 m/s, and with an assumed right atrial pressure of 3 mmHg, the estimated right ventricular systolic pressure is 48.5 mmHg. Left Atrium: Left atrial size was moderately dilated. Right Atrium: Right atrial size was moderately dilated. Tricuspid Valve: Tricuspid valve regurgitation is moderate to severe. Aortic Valve: Aortic valve regurgitation is moderate. Aortic regurgitation PHT measures 275 msec. LEFT VENTRICLE PLAX 2D LVIDd:         3.80 cm LVIDs:         2.60 cm LV PW:         1.20 cm LV IVS:        0.90 cm  LV Volumes (MOD) LV vol d, MOD A2C: 39.1 ml LV vol s, MOD A2C: 22.7 ml LV SV MOD A2C:     16.4 ml IVC IVC diam: 1.50 cm LEFT ATRIUM         Index LA diam:    4.50 cm 2.61 cm/m  AORTIC VALVE LVOT Vmax:   158.00 cm/s LVOT Vmean:  102.000 cm/s LVOT VTI:    0.242 m AI PHT:      275 msec  MR Peak grad: 119.7 mmHg    TRICUSPID VALVE MR Mean grad: 75.0 mmHg     TR Peak grad:   47.9 mmHg MR Vmax:      547.00 cm/s  TR Vmax:        346.00 cm/s MR Vmean:     391.0 cm/s MV E velocity: 69.20 cm/s   SHUNTS MV A velocity: 148.00 cm/s  Systemic VTI: 0.24 m MV E/A ratio:  0.47 Ena Dawley MD Electronically signed by Ena Dawley MD Signature Date/Time: 10/20/2020/4:38:28 PM    Final (Updated)     Anti-infectives: Anti-infectives (From admission, onward)   Start     Dose/Rate Route Frequency Ordered Stop   10/21/20 1800  vancomycin (VANCOCIN) IVPB 1000 mg/200 mL premix  Status:  Discontinued        1,000 mg 200 mL/hr over 60 Minutes Intravenous Every 48 hours 10/19/20 2216 10/20/20 1406   10/20/20 2200  cefTRIAXone (ROCEPHIN) 2 g in sodium chloride 0.9 % 100 mL IVPB        2 g 200 mL/hr over 30 Minutes Intravenous Every 24 hours 10/20/20 1443     10/19/20 2300  ceFEPIme (MAXIPIME) 2 g in sodium chloride 0.9 % 100 mL IVPB  Status:  Discontinued        2 g 200 mL/hr over 30 Minutes Intravenous Every 24 hours 10/19/20 2216 10/20/20 1443   10/19/20 2145  metroNIDAZOLE (FLAGYL) IVPB 500 mg        500 mg 100 mL/hr over 60 Minutes Intravenous Every 8 hours 10/19/20 2137     10/19/20 1600  vancomycin (VANCOCIN) IVPB 1000 mg/200 mL premix        1,000 mg 200 mL/hr over 60 Minutes Intravenous  Once 10/19/20 1555 10/19/20 1914   10/19/20 1600  piperacillin-tazobactam (ZOSYN) IVPB 3.375 g        3.375 g 100 mL/hr over 30 Minutes Intravenous  Once 10/19/20 1555 10/19/20 1941       Assessment/Plan DM Cor pulmonale, pulmonary HTN COPD OSA Right heart failure Type I aortic dissection s/p repair 09/13/2020  Anemia AKI on CKD Elevated transaminases   Abdominal pain Cholelithiasis, ?acute cholecystitis - per cardiology: moderate estimated risk of perioperative myocardial infarction, pulmonary edema, ventricular fibrillation, cardiac arrest, or complete heart block; No further cardiac  testing is recommended prior to surgery; It could be reasonable to proceed to surgery; have discussed with family about the risks and benefits. - Labs pending this AM. Continue IV antibiotics. Will await HIDA scan.   ID - currently rocephin/flagyl 11/6>> FEN - IVF, NPO VTE - SCDs Foley - none Follow up - TBD   LOS: 3 days    Wellington [place], [facility] 10/22/2020, 9:14 AM Please see Amion for pager number during day hours 7:00am-4:30pm

## 2020-10-22 NOTE — Progress Notes (Signed)
Cardiology Progress Note  Patient ID: Danielle Atkins MRN: [id number] DOB: [birth year] Date of Encounter: 10/22/2020  Primary Cardiologist: Pixie Casino, MD  Subjective   Chief Complaint: Denies symptoms.  HPI: Awaiting HIDA scan today.  Stable from a cardiovascular standpoint.  Euvolemic.  ROS:  All other ROS reviewed and negative. Pertinent positives noted in the HPI.     Inpatient Medications  Scheduled Meds: . allopurinol  300 mg Oral Daily  . vitamin C  1,000 mg Oral Daily  . aspirin  325 mg Oral Daily  . bisoprolol  5 mg Oral Daily  . cholecalciferol  5,000 Units Oral Daily  . digoxin  0.0625 mg Oral Daily  . famotidine  10 mg Oral Daily  . insulin aspart  0-9 Units Subcutaneous TID WC  . latanoprost  1 drop Both Eyes QHS  . levothyroxine  25 mcg Oral q AM  . sodium chloride flush  3 mL Intravenous Q12H  . traMADol  50 mg Oral Q12H   Continuous Infusions: . cefTRIAXone (ROCEPHIN)  IV 200 mL/hr at 10/22/20 0700  . lactated ringers 100 mL/hr at 10/22/20 2992  . metronidazole 500 mg (10/22/20 0531)   PRN Meds: acetaminophen **OR** acetaminophen, albuterol   Vital Signs   Vitals:   10/21/20 1700 10/21/20 2018 10/22/20 0002 10/22/20 0409  BP: 131/74 119/73 (!) 142/80 135/76  Pulse: 97 95 88 96  Resp: 20 (!) 25 20 16   Temp: 97.8 F (36.6 C) 98.9 F (37.2 C) 98.9 F (37.2 C) 97.7 F (36.5 C)  TempSrc: Oral Oral Oral Axillary  SpO2: 95% 99% 99% 98%  Weight:    66.5 kg  Height:        Intake/Output Summary (Last 24 hours) at 10/22/2020 0738 Last data filed at 10/22/2020 0700 Gross per 24 hour  Intake 1260.35 ml  Output 500 ml  Net 760.35 ml   Last 3 Weights 10/22/2020 10/18/2020 10/10/2020  Weight (lbs) 146 lb 9.7 oz 146 lb 6.4 oz 150 lb 9.6 oz  Weight (kg) 66.5 kg 66.407 kg 68.312 kg      Telemetry  Overnight telemetry shows sinus rhythm with heart rate in the 90s, brief A. tach episodes over less than 10 seconds, which I personally reviewed.    ECG  The most recent ECG shows sinus tachycardia, heart rate 111, right bundle branch block with left anterior fascicular block, which I personally reviewed.   Physical Exam   Vitals:   10/21/20 1700 10/21/20 2018 10/22/20 0002 10/22/20 0409  BP: 131/74 119/73 (!) 142/80 135/76  Pulse: 97 95 88 96  Resp: 20 (!) 25 20 16   Temp: 97.8 F (36.6 C) 98.9 F (37.2 C) 98.9 F (37.2 C) 97.7 F (36.5 C)  TempSrc: Oral Oral Oral Axillary  SpO2: 95% 99% 99% 98%  Weight:    66.5 kg  Height:         Intake/Output Summary (Last 24 hours) at 10/22/2020 0738 Last data filed at 10/22/2020 0700 Gross per 24 hour  Intake 1260.35 ml  Output 500 ml  Net 760.35 ml    Last 3 Weights 10/22/2020 10/18/2020 10/10/2020  Weight (lbs) 146 lb 9.7 oz 146 lb 6.4 oz 150 lb 9.6 oz  Weight (kg) 66.5 kg 66.407 kg 68.312 kg    Body mass index is 24.78 kg/m.   General: Well nourished, well developed, in no acute distress Head: Atraumatic, normal size  Eyes: PEERLA, EOMI  Neck: Supple, JVD 7 to 8 cm of water Endocrine:  No thryomegaly Cardiac: Normal S1, S2; RRR; no murmurs, rubs, or gallops Lungs: Clear to auscultation bilaterally, no wheezing, rhonchi or rales  Abd: Soft, nontender, no hepatomegaly  Ext: No edema, pulses 2+ Musculoskeletal: No deformities, BUE and BLE strength normal and equal Skin: Warm and dry, no rashes   Neuro: Alert and oriented to person, place, time, and situation, CNII-XII grossly intact, no focal deficits  Psych: Normal mood and affect   Labs  High Sensitivity Troponin:  No results for input(s): TROPONINIHS in the last 720 hours.   Cardiac EnzymesNo results for input(s): TROPONINI in the last 168 hours. No results for input(s): TROPIPOC in the last 168 hours.  Chemistry Recent Labs  Lab 10/19/20 1300 10/20/20 0901 10/21/20 0851  NA 135 138 140  K 4.5 3.9 4.2  CL 99 102 105  CO2 24 23 23   GLUCOSE 153* 125* 100*  BUN 24* 31* 30*  CREATININE 1.86* 1.90* 1.59*  CALCIUM  9.6 9.2 9.1  PROT  --  7.4  7.3 6.6  ALBUMIN  --  2.1*  2.1* 1.8*  AST  --  84*  84* 64*  ALT  --  63*  65* 48*  ALKPHOS  --  66  68 72  BILITOT  --  1.5*  1.3* 0.9  GFRNONAA 27* 26* 32*  ANIONGAP 12 13 12     Hematology Recent Labs  Lab 10/19/20 2010 10/20/20 0901 10/21/20 0851  WBC 25.5* 24.2* 21.4*  RBC 3.41* 3.72* 3.59*  HGB 7.0* 7.9*  7.8* 7.5*  HCT 24.1* 26.3*  26.4* 25.7*  MCV 70.7* 70.7* 71.6*  MCH 20.5* 21.2* 20.9*  MCHC 29.0* 30.0 29.2*  RDW 21.1* 21.0* 20.8*  PLT 467* 388 329   BNP Recent Labs  Lab 10/19/20 1522  BNP 580.5*    DDimer No results for input(s): DDIMER in the last 168 hours.   Radiology  US RENAL  Result Date: 10/20/2020 CLINICAL DATA:  Inpatient.  Acute kidney injury. EXAM: RENAL / URINARY TRACT ULTRASOUND COMPLETE COMPARISON:  10/19/2020 abdominal sonogram. FINDINGS: Right Kidney: Renal measurements: 10.0 x 4.3 x 4.2 cm = volume: 96 mL. Echogenic right renal parenchyma, normal thickness. No hydronephrosis. Simple 1.5 x 1.5 x 1.7 cm lower right renal cyst. Left Kidney: Renal measurements: 10.2 x 6.1 x 5.1 cm = volume: 166 mL. Echogenic left renal parenchyma, normal thickness. No hydronephrosis. No renal mass. Bladder: Appears normal for degree of bladder distention. Other: None. IMPRESSION: 1. No hydronephrosis. 2. Echogenic kidneys, compatible with the reported history of nonspecific acute renal parenchymal disease. 3. Small simple right renal cyst. 4. Normal bladder. Electronically Signed   By: Ilona Sorrel M.D.   On: 10/20/2020 17:23   ECHOCARDIOGRAM LIMITED  Result Date: 10/20/2020    ECHOCARDIOGRAM LIMITED REPORT   Patient Name:   Danielle Atkins Date of Exam: 10/20/2020 Medical Rec #:  [id number]           Height:       64.5 in Accession #:    [id number]          Weight:       146.4 lb Date of Birth:  1934/12/21            BSA:          1.723 m Patient Age:    85 years            BP:           116/74 mmHg Patient Gender: F  HR:           110 bpm. Exam Location:  Inpatient Procedure: Limited Echo, Color Doppler and Cardiac Doppler Indications:     pre-operative exam  History:         Patient has prior history of Echocardiogram examinations, most                  recent 09/19/2020. Aortic dissection repair; Risk                  Factors:Hypertension, Dyslipidemia and Diabetes.  Sonographer:     Johny Chess Referring Phys:  [place] Diagnosing Phys: Ena Dawley MD IMPRESSIONS  1. Left ventricular ejection fraction, by estimation, is 55 to 60%. The left ventricle has normal function. Left ventricular diastolic parameters are consistent with Grade I diastolic dysfunction (impaired relaxation).  2. Right ventricular systolic function is moderately reduced. The right ventricular size is moderately enlarged. There is moderately elevated pulmonary artery systolic pressure. The estimated right ventricular systolic pressure is 65.7 mmHg.  3. Left atrial size was moderately dilated.  4. Right atrial size was moderately dilated.  5. Tricuspid valve regurgitation is moderate to severe.  6. Aortic valve regurgitation is moderate. FINDINGS  Left Ventricle: Left ventricular ejection fraction, by estimation, is 55 to 60%. The left ventricle has normal function. Left ventricular diastolic parameters are consistent with Grade I diastolic dysfunction (impaired relaxation). Right Ventricle: The right ventricular size is moderately enlarged. Right ventricular systolic function is moderately reduced. There is moderately elevated pulmonary artery systolic pressure. The tricuspid regurgitant velocity is 3.46 m/s, and with an assumed right atrial pressure of 3 mmHg, the estimated right ventricular systolic pressure is 84.6 mmHg. Left Atrium: Left atrial size was moderately dilated. Right Atrium: Right atrial size was moderately dilated. Tricuspid Valve: Tricuspid valve regurgitation is moderate to severe. Aortic Valve: Aortic valve  regurgitation is moderate. Aortic regurgitation PHT measures 275 msec. LEFT VENTRICLE PLAX 2D LVIDd:         3.80 cm LVIDs:         2.60 cm LV PW:         1.20 cm LV IVS:        0.90 cm  LV Volumes (MOD) LV vol d, MOD A2C: 39.1 ml LV vol s, MOD A2C: 22.7 ml LV SV MOD A2C:     16.4 ml IVC IVC diam: 1.50 cm LEFT ATRIUM         Index LA diam:    4.50 cm 2.61 cm/m  AORTIC VALVE LVOT Vmax:   158.00 cm/s LVOT Vmean:  102.000 cm/s LVOT VTI:    0.242 m AI PHT:      275 msec MR Peak grad: 119.7 mmHg    TRICUSPID VALVE MR Mean grad: 75.0 mmHg     TR Peak grad:   47.9 mmHg MR Vmax:      547.00 cm/s   TR Vmax:        346.00 cm/s MR Vmean:     391.0 cm/s MV E velocity: 69.20 cm/s   SHUNTS MV A velocity: 148.00 cm/s  Systemic VTI: 0.24 m MV E/A ratio:  0.47 Ena Dawley MD Electronically signed by Ena Dawley MD Signature Date/Time: 10/20/2020/4:38:28 PM    Final (Updated)     Cardiac Studies  TTE 10/20/2020 1. Left ventricular ejection fraction, by estimation, is 55 to 60%. The  left ventricle has normal function. Left ventricular diastolic parameters  are consistent with Grade I diastolic  dysfunction (impaired relaxation).  2. Right ventricular systolic function is moderately reduced. The right  ventricular size is moderately enlarged. There is moderately elevated  pulmonary artery systolic pressure. The estimated right ventricular  systolic pressure is 32.6 mmHg.  3. Left atrial size was moderately dilated.  4. Right atrial size was moderately dilated.  5. Tricuspid valve regurgitation is moderate to severe.  6. Aortic valve regurgitation is moderate.   Patient Profile  Danielle Atkins is a 85 y.o. female with history of aortic dissection status post repair, RV failure secondary to OSA/COPD, moderate to severe pulmonary hypertension, CKD, hypertension, diabetes, prior TIA who was admitted on 10/20/2020 with concerns for acute cholecystitis.  Cardiology consulted for preoperative  assessment.  Assessment & Plan   1.  Preoperative assessment -See previous cardiology notes.  No further cardiac testing has been recommended.  I do agree.  She is euvolemic on exam.  2.  Cor pulmonale/moderate to severe pulmonary hypertension/COPD/OSA -She appears euvolemic.  Holding her home Lasix. -Would recommend to continue her bisoprolol and digoxin for now.  She is on bisoprolol 5 mg twice daily.  Okay to continue digoxin which is at reduced dose 0.0625 mg daily. -I have discontinued her IV fluids.  Due to her right heart failure we should be very cautious and judicious with intravenous fluids.  3.  Aortic dissection status post repair -She was on 325 mg of aspirin.  This is on hold.  Restart when able per surgery.  The cardiology service will follow peripherally.  Please call with questions.  For questions or updates, please contact [place] Please consult [email or web address] for contact info under   Time Spent with Patient: I have spent a total of 25 minutes with patient reviewing hospital notes, telemetry, EKGs, labs and examining the patient as well as establishing an assessment and plan that was discussed with the patient.  > 50% of time was spent in direct patient care.    Signed, Addison Naegeli. Audie Box, [place]  10/22/2020 7:38 AM

## 2020-10-22 NOTE — Progress Notes (Signed)
PROGRESS NOTE    Danielle Atkins  NTZ:[id number] DOB: 1935/09/12 DOA: 10/19/2020 PCP: [name], [name], MD  Brief Narrative:   85 year old black female Recent emergent repair type I aortic dissection 09/13/2020-at that admission developed acute kidney injury had post cardiotomy shock History of prior TIA, DM TY 2, HTN, HLD Admitted with 2 days of decreased activity confusion abdominal pain-unable to participate in HPI Admitted for metabolic encephalopathy blood pressures 675 systolic creatinine up from 1.2-1.8 hemoglobin 7.5 down from 9.2 lactic acid 2.2 BNP 500, WBC 30  Not been herself and sleeps a lot since the past 1-2 weeks and has lost some weight and felt like comething "exploded" in her side--has had pain on the R flank  Came to hospital with sepsis like picture--gen surergy consulted, did not feel this was cholecystitis Patient started on abx Gen surgery re-consulted, recommending HIDA scan and Cardiolgy input given hi pre-op risk  Await HIDA and decide on next steps  Assessment & Plan:   Principal Problem:   ANEMIA-NOS Active Problems:   Essential hypertension   Right heart failure (HCC)   Diabetes mellitus ([place])   TIA (transient ischemic attack)   Encephalopathy acute   Acute renal failure superimposed on stage 3 chronic kidney disease ([place])   SIRS (systemic inflammatory response syndrome) ([place])   1. Acute metabolic encephalopathy?  Infection a. Patient much improved from admit b. Hold nephrotoxins 2. ?  SBO a. Imaging was suggestive of the same but is tolerating clears without issues b. Tolerating carb moderate diet 3. Probable sepsis on admission from choelcystitis a. -lactic acid was 2.2 admission b. dc'd vancomycin--cont ceftriaxone Flagyl c. Abdominal ultrasound showed nonmobile 9 mm stone in gallbladder neck--HIDA shows no ejection fraction but does not confirm acute cholecystitis d. Discussed with Dr. Ninfa Linden of surgery who feels no surgery  nor drain would be recommended this antibiotic management greatly appreciate expertise e. Blood cultures from 11/5X2 NGTD, urine culture from 11/5 <10,000 4. Anemia of unclear etiology?  Blood loss a. Baseline hemoglobin 8-9 currently in the seven range b. transfused 1 u PRBC 11/6 c. Iron studies show decreased saturations and low iron so we will need some Feraheme d. Monitor for acute bleed with Hemoccult 5. Acute kidney injury superimposed on CKD 3 a. Kidney function improved admission baseline 22/1.2 b. Saline locked on 11/8 c. Renal US is nonspecific 6. Chronic right heart failure after type I aortic dissection a. Continue bisoprolol with increased dose to 5 mg digoxin 0.0625 b.  monitor on telemetry c. A.m. magnesium EDC 7. DM TY 2 a. Check twice daily b. CBG ranging from one  70-80's  DVT prophylaxis: SCDs Code Status: Full Family Communication: Discussed at the bedside with daughter via phone number [phone] Disposition:   Status is: Inpatient  Remains inpatient appropriate because:Hemodynamically unstable, Persistent severe electrolyte disturbances and Ongoing active pain requiring inpatient pain management   Dispo: The patient is from: Home              Anticipated d/c is to: SNF              Anticipated d/c date is: 2 days              Patient currently is not medically stable to d/c.    Consultants:   None yet  Procedures: None  Antimicrobials: As above   Subjective: Pleasant coherent no distress felt a little nauseous after the HIDA scan otherwise is tolerating some clear liquids without issue no  abdominal pain no fever no chills  Objective: Vitals:   10/22/20 0002 10/22/20 0409 10/22/20 0750 10/22/20 1200  BP: (!) 142/80 135/76 (!) 146/81 (!) 142/78  Pulse: 88 96 98 98  Resp: 20 16 19 16   Temp: 98.9 F (37.2 C) 97.7 F (36.5 C)  (!) 97.5 F (36.4 C)  TempSrc: Oral Axillary  Oral  SpO2: 99% 98%    Weight:  66.5 kg    Height:         Intake/Output Summary (Last 24 hours) at 10/22/2020 1537 Last data filed at 10/22/2020 0700 Gross per 24 hour  Intake 1040.35 ml  Output 500 ml  Net 540.35 ml   Filed Weights   10/22/20 0409  Weight: 66.5 kg    Examination: Awake alert coherent no distress EOMI NCAT no focal deficit chest clear no added sound Neck soft supple Abdomen soft no rebound No lower extremity edema    Data Reviewed: I have personally reviewed following labs and imaging studies BUN/creatinine 24/1.8-->31/1.9-->30/1.6 LFTs slightly elevated AST/ALT 84/63-->64/48-->53/39 White count down from 30-->24.2-->21-->16 Hemoglobin 7.9-->7.5-7.3 Platelet 388  Radiology Studies: ECHOCARDIOGRAM LIMITED  Result Date: 10/20/2020    ECHOCARDIOGRAM LIMITED REPORT   Patient Name:   Danielle Atkins Date of Exam: 10/20/2020 Medical Rec #:  [id number]           Height:       64.5 in Accession #:    [id number]          Weight:       146.4 lb Date of Birth:  08-05-1935            BSA:          1.723 m Patient Age:    71 years            BP:           116/74 mmHg Patient Gender: F                   HR:           110 bpm. Exam Location:  Inpatient Procedure: Limited Echo, Color Doppler and Cardiac Doppler Indications:     pre-operative exam  History:         Patient has prior history of Echocardiogram examinations, most                  recent 09/19/2020. Aortic dissection repair; Risk                  Factors:Hypertension, Dyslipidemia and Diabetes.  Sonographer:     Johny Chess Referring Phys:  [place] Diagnosing Phys: Ena Dawley MD IMPRESSIONS  1. Left ventricular ejection fraction, by estimation, is 55 to 60%. The left ventricle has normal function. Left ventricular diastolic parameters are consistent with Grade I diastolic dysfunction (impaired relaxation).  2. Right ventricular systolic function is moderately reduced. The right ventricular size is moderately enlarged. There is moderately elevated pulmonary  artery systolic pressure. The estimated right ventricular systolic pressure is 02.5 mmHg.  3. Left atrial size was moderately dilated.  4. Right atrial size was moderately dilated.  5. Tricuspid valve regurgitation is moderate to severe.  6. Aortic valve regurgitation is moderate. FINDINGS  Left Ventricle: Left ventricular ejection fraction, by estimation, is 55 to 60%. The left ventricle has normal function. Left ventricular diastolic parameters are consistent with Grade I diastolic dysfunction (impaired relaxation). Right Ventricle: The right ventricular size is moderately enlarged. Right ventricular systolic  function is moderately reduced. There is moderately elevated pulmonary artery systolic pressure. The tricuspid regurgitant velocity is 3.46 m/s, and with an assumed right atrial pressure of 3 mmHg, the estimated right ventricular systolic pressure is 14.4 mmHg. Left Atrium: Left atrial size was moderately dilated. Right Atrium: Right atrial size was moderately dilated. Tricuspid Valve: Tricuspid valve regurgitation is moderate to severe. Aortic Valve: Aortic valve regurgitation is moderate. Aortic regurgitation PHT measures 275 msec. LEFT VENTRICLE PLAX 2D LVIDd:         3.80 cm LVIDs:         2.60 cm LV PW:         1.20 cm LV IVS:        0.90 cm  LV Volumes (MOD) LV vol d, MOD A2C: 39.1 ml LV vol s, MOD A2C: 22.7 ml LV SV MOD A2C:     16.4 ml IVC IVC diam: 1.50 cm LEFT ATRIUM         Index LA diam:    4.50 cm 2.61 cm/m  AORTIC VALVE LVOT Vmax:   158.00 cm/s LVOT Vmean:  102.000 cm/s LVOT VTI:    0.242 m AI PHT:      275 msec MR Peak grad: 119.7 mmHg    TRICUSPID VALVE MR Mean grad: 75.0 mmHg     TR Peak grad:   47.9 mmHg MR Vmax:      547.00 cm/s   TR Vmax:        346.00 cm/s MR Vmean:     391.0 cm/s MV E velocity: 69.20 cm/s   SHUNTS MV A velocity: 148.00 cm/s  Systemic VTI: 0.24 m MV E/A ratio:  0.47 Ena Dawley MD Electronically signed by Ena Dawley MD Signature Date/Time: 10/20/2020/4:38:28 PM     Final (Updated)      Scheduled Meds: . allopurinol  300 mg Oral Daily  . vitamin C  1,000 mg Oral Daily  . aspirin  325 mg Oral Daily  . bisoprolol  5 mg Oral Daily  . cholecalciferol  5,000 Units Oral Daily  . digoxin  0.0625 mg Oral Daily  . famotidine  10 mg Oral Daily  . insulin aspart  0-9 Units Subcutaneous TID WC  . latanoprost  1 drop Both Eyes QHS  . levothyroxine  25 mcg Oral q AM  . sodium chloride flush  3 mL Intravenous Q12H  . traMADol  50 mg Oral Q12H   Continuous Infusions: . cefTRIAXone (ROCEPHIN)  IV 200 mL/hr at 10/22/20 0700  . metronidazole 500 mg (10/22/20 0531)     LOS: 3 days    Time spent: 25  Nita Sells, MD [facility] To contact the attending provider between 7A-7P or the covering provider during after hours 7P-7A, please log into the web site [email or web address] and access using universal [place] password for that web site. If you do not have the password, please call the hospital operator.  10/22/2020, 3:37 PM

## 2020-10-22 NOTE — Plan of Care (Signed)

## 2020-10-23 LAB — CBC WITH DIFFERENTIAL/PLATELET
Abs Immature Granulocytes: 0.15 10*3/uL — ABNORMAL HIGH (ref 0.00–0.07)
Basophils Absolute: 0 10*3/uL (ref 0.0–0.1)
Basophils Relative: 0 %
Eosinophils Absolute: 0 10*3/uL (ref 0.0–0.5)
Eosinophils Relative: 0 %
HCT: 25.2 % — ABNORMAL LOW (ref 36.0–46.0)
Hemoglobin: 7.3 g/dL — ABNORMAL LOW (ref 12.0–15.0)
Immature Granulocytes: 1 %
Lymphocytes Relative: 9 %
Lymphs Abs: 1.2 10*3/uL (ref 0.7–4.0)
MCH: 20.9 pg — ABNORMAL LOW (ref 26.0–34.0)
MCHC: 29 g/dL — ABNORMAL LOW (ref 30.0–36.0)
MCV: 72 fL — ABNORMAL LOW (ref 80.0–100.0)
Monocytes Absolute: 0.9 10*3/uL (ref 0.1–1.0)
Monocytes Relative: 7 %
Neutro Abs: 10.8 10*3/uL — ABNORMAL HIGH (ref 1.7–7.7)
Neutrophils Relative %: 83 %
Platelets: 321 10*3/uL (ref 150–400)
RBC: 3.5 MIL/uL — ABNORMAL LOW (ref 3.87–5.11)
RDW: 21.6 % — ABNORMAL HIGH (ref 11.5–15.5)
WBC: 13 10*3/uL — ABNORMAL HIGH (ref 4.0–10.5)
nRBC: 0.4 % — ABNORMAL HIGH (ref 0.0–0.2)

## 2020-10-23 LAB — TYPE AND SCREEN
ABO/RH(D): AB POS
Antibody Screen: NEGATIVE
Unit division: 0
Unit division: 0

## 2020-10-23 LAB — COMPREHENSIVE METABOLIC PANEL
ALT: 35 U/L (ref 0–44)
AST: 41 U/L (ref 15–41)
Albumin: 1.6 g/dL — ABNORMAL LOW (ref 3.5–5.0)
Alkaline Phosphatase: 65 U/L (ref 38–126)
Anion gap: 8 (ref 5–15)
BUN: 34 mg/dL — ABNORMAL HIGH (ref 8–23)
CO2: 26 mmol/L (ref 22–32)
Calcium: 8.9 mg/dL (ref 8.9–10.3)
Chloride: 106 mmol/L (ref 98–111)
Creatinine, Ser: 1.59 mg/dL — ABNORMAL HIGH (ref 0.44–1.00)
GFR, Estimated: 32 mL/min — ABNORMAL LOW (ref >60)
Glucose, Bld: 109 mg/dL — ABNORMAL HIGH (ref 70–99)
Potassium: 3.1 mmol/L — ABNORMAL LOW (ref 3.5–5.1)
Sodium: 140 mmol/L (ref 135–145)
Total Bilirubin: 0.9 mg/dL (ref 0.3–1.2)
Total Protein: 6.4 g/dL — ABNORMAL LOW (ref 6.5–8.1)

## 2020-10-23 LAB — BPAM RBC
Blood Product Expiration Date: 202111202359
Blood Product Expiration Date: 202112042359
ISSUE DATE / TIME: 202111040111
ISSUE DATE / TIME: 202111060338
Unit Type and Rh: 7300
Unit Type and Rh: 7300

## 2020-10-23 LAB — GLUCOSE, CAPILLARY
Glucose-Capillary: 95 mg/dL (ref 70–99)
Glucose-Capillary: 89 mg/dL (ref 70–99)

## 2020-10-23 MED ORDER — AMOXICILLIN-POT CLAVULANATE 875-125 MG PO TABS: 1.0000 | ORAL_TABLET | Freq: Two times a day (BID) | ORAL | 0 refills | Status: DC

## 2020-10-23 MED ORDER — ACETAMINOPHEN 325 MG PO TABS: 650.0000 mg | ORAL_TABLET | Freq: Four times a day (QID) | ORAL | Status: AC | PRN

## 2020-10-23 MED ORDER — FERROUS SULFATE 325 (65 FE) MG PO TABS: 325.0000 mg | ORAL_TABLET | Freq: Two times a day (BID) | ORAL | 2 refills | Status: AC

## 2020-10-23 MED ORDER — AMOXICILLIN-POT CLAVULANATE 875-125 MG PO TABS
1.0000 | ORAL_TABLET | Freq: Two times a day (BID) | ORAL | Status: DC
  Administered 2020-10-23 – 2020-10-24 (×3): 1 via ORAL
  Filled 2020-10-23 (×3): qty 1

## 2020-10-23 MED ORDER — DIGOXIN 125 MCG PO TABS
0.0625 mg | ORAL_TABLET | Freq: Every day | ORAL | 3 refills | Status: DC
Start: 2020-10-23 — End: 2020-10-24

## 2020-10-23 MED ORDER — VITAMIN C 100 MG PO TABS: 1000.0000 mg | ORAL_TABLET | Freq: Every day | ORAL | Status: AC

## 2020-10-23 MED ORDER — VITAMIN D 25 MCG (1000 UNIT) PO TABS: 5000.0000 [IU] | ORAL_TABLET | Freq: Every day | ORAL | Status: AC

## 2020-10-23 MED ORDER — FERROUS SULFATE 325 (65 FE) MG PO TABS
325.0000 mg | ORAL_TABLET | Freq: Two times a day (BID) | ORAL | Status: DC
  Administered 2020-10-23 – 2020-10-24 (×2): 325 mg via ORAL
  Filled 2020-10-23 (×2): qty 1

## 2020-10-23 MED ORDER — SODIUM CHLORIDE 0.9 % IV SOLN
510.0000 mg | Freq: Once | INTRAVENOUS | Status: AC
  Administered 2020-10-23: 510 mg via INTRAVENOUS
  Filled 2020-10-23: qty 17

## 2020-10-23 MED ORDER — TRAMADOL HCL 50 MG PO TABS: 50.0000 mg | ORAL_TABLET | Freq: Two times a day (BID) | ORAL | 0 refills | Status: DC

## 2020-10-23 MED ORDER — AMOXICILLIN-POT CLAVULANATE 875-125 MG PO TABS: 1.0000 | ORAL_TABLET | Freq: Two times a day (BID) | ORAL | 0 refills | Status: AC

## 2020-10-23 MED ORDER — POTASSIUM CHLORIDE CRYS ER 20 MEQ PO TBCR: 40.0000 meq | EXTENDED_RELEASE_TABLET | Freq: Every day | ORAL | 0 refills | Status: AC

## 2020-10-23 MED ORDER — BISOPROLOL FUMARATE 5 MG PO TABS: 5.0000 mg | ORAL_TABLET | Freq: Every day | ORAL | 0 refills | Status: AC

## 2020-10-23 MED ORDER — FERROUS SULFATE 325 (65 FE) MG PO TABS
325.0000 mg | ORAL_TABLET | Freq: Two times a day (BID) | ORAL | 2 refills | Status: DC
Start: 2020-10-23 — End: 2020-10-23

## 2020-10-23 MED ORDER — BISOPROLOL FUMARATE 5 MG PO TABS: 5.0000 mg | ORAL_TABLET | Freq: Every day | ORAL | Status: DC

## 2020-10-23 MED ORDER — POTASSIUM CHLORIDE CRYS ER 20 MEQ PO TBCR
40.0000 meq | EXTENDED_RELEASE_TABLET | Freq: Every day | ORAL | Status: DC
  Administered 2020-10-23 – 2020-10-24 (×2): 40 meq via ORAL
  Filled 2020-10-23 (×2): qty 2

## 2020-10-23 NOTE — Care Management Important Message (Signed)
Important Message  Patient Details  Name: Danielle Atkins MRN: [id number] Date of Birth: August 15, 1935   Medicare Important Message Given:  Yes     Inetta Dicke [address] Circle 10/23/2020, 12:49 PM

## 2020-10-23 NOTE — Plan of Care (Signed)
  Problem: Health Behavior/Discharge Planning: Goal: Ability to manage health-related needs will improve Outcome: Progressing   Problem: Education: Goal: Knowledge of General Education information will improve Description: Including pain rating scale, medication(s)/side effects and non-pharmacologic comfort measures Outcome: Progressing   Problem: Clinical Measurements: Goal: Ability to maintain clinical measurements within normal limits will improve Outcome: Progressing Goal: Will remain free from infection Outcome: Progressing Goal: Diagnostic test results will improve Outcome: Progressing Goal: Respiratory complications will improve Outcome: Progressing Goal: Cardiovascular complication will be avoided Outcome: Progressing   Problem: Activity: Goal: Risk for activity intolerance will decrease Outcome: Progressing   Problem: Nutrition: Goal: Adequate nutrition will be maintained Outcome: Progressing   Problem: Coping: Goal: Level of anxiety will decrease Outcome: Progressing   Problem: Elimination: Goal: Will not experience complications related to bowel motility Outcome: Progressing Goal: Will not experience complications related to urinary retention Outcome: Progressing   Problem: Pain Managment: Goal: General experience of comfort will improve Outcome: Progressing   Problem: Safety: Goal: Ability to remain free from injury will improve Outcome: Progressing   Problem: Skin Integrity: Goal: Risk for impaired skin integrity will decrease Outcome: Progressing   

## 2020-10-23 NOTE — Progress Notes (Signed)
    Durable Medical Equipment  (From admission, onward)         Start     Ordered   10/23/20 1305  For home use only DME lightweight manual wheelchair with seat cushion  Once       Comments: Patient suffers from weakness which impairs their ability to perform daily activities like dressing, bathing in the home.  A cane or walker will not resolve  issue with performing activities of daily living. A wheelchair will allow patient to safely perform daily activities. Patient is not able to propel themselves in the home using a standard weight wheelchair due to weakness. Patient can self propel in the lightweight wheelchair. Length of need lifetime. Accessories: elevating leg rests (ELRs), wheel locks, extensions and anti-tippers.   10/23/20 1306

## 2020-10-23 NOTE — Discharge Summary (Addendum)
Physician Discharge Summary  [place] PJA:[id number] DOB: 07-18-1935 DOA: 10/19/2020  PCP: [name], [name], MD  Admit date: 10/19/2020 Discharge date: 10/23/2020  Time spent: 37 minutes  Recommendations for Outpatient Follow-up:  1. Requires Chem-12, CBC and INR as well as magnesium in about 1 to 2 weeks 2. Complete Augmentin 14-day course as per HPI and MAR 3. Lasix and potassium held this admission-May need to reimplement in about 1 week 4. Bisoprolol dosage increased this admission 5. Will require 2 liters oxygen on d/c  Discharge Diagnoses:  Principal Problem:   ANEMIA-NOS Active Problems:   Essential hypertension   Right heart failure (HCC)   Diabetes mellitus (HCC)   TIA (transient ischemic attack)   Encephalopathy acute   Acute renal failure superimposed on stage 3 chronic kidney disease (HCC)   SIRS (systemic inflammatory response syndrome) ([place])   Discharge Condition: Improved  Diet recommendation: Heart healthy  Filed Weights   10/22/20 0409 10/23/20 0359  Weight: 66.5 kg 67.5 kg    History of present illness:  85 year old black female Recent emergent repair type I aortic dissection 09/13/2020-at that admission developed acute kidney injury had post cardiotomy shock History of prior TIA, DM TY 2, HTN, HLD Admitted with 2 days of decreased activity confusion abdominal pain-unable to participate in HPI Admitted for metabolic encephalopathy blood pressures 341 systolic creatinine up from 1.2-1.8 hemoglobin 7.5 down from 9.2 lactic acid 2.2 BNP 500, WBC 30  Not been herself and sleeps a lot since the past 1-2 weeks and has lost some weight and felt like comething "exploded" in her side--has had pain on the R flank  Came to hospital with sepsis like picture--gen surergy consulted, did not feel this was cholecystitis Patient started on abx Gen surgery re-consulted, recommending HIDA scan and Cardiolgy input given hi pre-op risk  HIDA was  performed not showing acute cholecystitis but showing low EF and general surgery did not think patient would need a cholecystectomy or cholecystotomy Patient stabilizing for discharge home with home health with the goal to be discharged back to her son's house in [place] by the end of the week  Hospital Course:  1. Acute metabolic encephalopathy?  Infection a. Patient much improved from admit b. Hold nephrotoxins 2. ?  SBO a. Imaging was suggestive of the same but is tolerating clears without issues b. Tolerating carb moderate diet 3. Probable sepsis on admission from choelcystitis a. -lactic acid was 2.2 admission and she was placed on broad-spectrum vancomycin cefepime-Flagyl b. Abdominal ultrasound showed nonmobile 9 mm stone in gallbladder neck--HIDA shows no ejection fraction but does not confirm acute cholecystitis c. Discussed with Dr. Ninfa Linden of surgery who feels no surgery nor drain would be recommended this antibiotic management greatly appreciate  d. Patient will discharge home on Augmentin to complete a 14-day course as calculated above e. Blood cultures from 11/5X2 NGTD, urine culture from 11/5 <10,000 4. Anemia of unclear etiology?  Blood loss a. Baseline hemoglobin 8-9 currently in the seven range b. transfused 1 u PRBC 11/6 c. Iron studies show decreased saturations and low iron  d. Will give Feraheme on 11/9 given low iron levels e. Discharged on ferrous sulfate twice daily and follow-up labs in the next several weeks 5. Acute kidney injury superimposed on CKD 3 a. Kidney function improved admission baseline 22/1.2 b. Saline locked on 11/8 c. Renal US is nonspecific 6. Chronic right heart failure after type I aortic dissection a. Continue bisoprolol with increased dose to 5 mg  digoxin 0.0625 b.  monitor on telemetry 7. DM TY 2 a. 4 times daily AC at bedtime checks b. CBG ranging 100-1 85 will actually discontinue checks prior to discharge c. Resume Metformin on  discharge  Procedures:  CT scan abdomen pelvis  Ultrasound abdomen pelvis  HIDA scan   Consultations:  Dr. Ninfa Linden of general surgery  Discharge Exam: Vitals:   10/23/20 0725 10/23/20 0928  BP:    Pulse:  85  Resp:    Temp:    SpO2: 98%    Awake coherent no distress EOMI NCAT no focal deficit General: Tolerating diet looks well sitting up in bed but did desat so will need oxygen on discharge home Cardiovascular: S1-S2 no murmur no rub no gallop Respiratory: Clear no rales no rhonchi no added sound Abdomen soft no rebound no guarding  Discharge Instructions   Discharge Instructions    Diet - low sodium heart healthy   Complete by: As directed    Discharge instructions   Complete by: As directed    Would complete Augmentin and likely reassess in the outpatient setting with either PCP or general surgery regarding interval removal of gallbladder Noticed that we have discontinued Lasix this may need to be reinitiated in 1 to 2 weeks in the outpatient setting also noticed that potassium was held this admission and there are some dosage changes on bisoprolol etc. Limited prescription of tramadol will be given for acute pain, use Tylenol as first choice Please follow-up at the emergency room if severe pain nausea or vomiting   Increase activity slowly   Complete by: As directed    No wound care   Complete by: As directed      Allergies as of 10/23/2020   No Known Allergies     Medication List    STOP taking these medications   furosemide 40 MG tablet Commonly known as: LASIX   potassium chloride SA 20 MEQ tablet Commonly known as: KLOR-CON     TAKE these medications   acetaminophen 325 MG tablet Commonly known as: TYLENOL Take 2 tablets (650 mg total) by mouth every 6 (six) hours as needed for mild pain (or Fever >/= 101).   albuterol 108 (90 Base) MCG/ACT inhaler Commonly known as: ProAir HFA Inhale 2 puffs into the lungs every 6 (six) hours as needed for  wheezing.   allopurinol 300 MG tablet Commonly known as: ZYLOPRIM Take 1 tablet (300 mg total) by mouth daily.   amoxicillin-clavulanate 875-125 MG tablet Commonly known as: AUGMENTIN Take 1 tablet by mouth every 12 (twelve) hours for 9 days.   ascorbic acid 500 MG tablet Commonly known as: VITAMIN C Take 1,000 mg by mouth in the morning.   vitamin C 100 MG tablet Take 10 tablets (1,000 mg total) by mouth daily. Takes C Complex   aspirin 325 MG EC tablet Take 1 tablet (325 mg total) by mouth daily.   bisoprolol 5 MG tablet Commonly known as: ZEBETA Take 1 tablet (5 mg total) by mouth daily. Start taking on: October 24, 2020 What changed: how much to take   digoxin 0.125 MG tablet Commonly known as: LANOXIN Take 0.5 tablets (0.0625 mg total) by mouth daily. What changed:   how much to take  when to take this  additional instructions   famotidine 40 MG tablet Commonly known as: PEPCID Take 1 tablet (40 mg total) by mouth daily.   feeding supplement (GLUCERNA SHAKE) Liqd Take 237 mLs by mouth 2 (two) times daily  between meals.   latanoprost 0.005 % ophthalmic solution Commonly known as: XALATAN Place 1 drop into both eyes at bedtime.   levothyroxine 25 MCG tablet Commonly known as: SYNTHROID Take 1 tablet (25 mcg total) by mouth in the morning.   metFORMIN 500 MG tablet Commonly known as: GLUCOPHAGE Take 1 tablet (500 mg total) by mouth daily with breakfast.   simvastatin 40 MG tablet Commonly known as: ZOCOR Take 1 tablet (40 mg total) by mouth daily. What changed: when to take this   traMADol 50 MG tablet Commonly known as: ULTRAM Take 1 tablet (50 mg total) by mouth every 12 (twelve) hours.   Vitamin D3 125 MCG (5000 UT) Tabs Take 5,000 Units by mouth daily.   cholecalciferol 25 MCG (1000 UNIT) tablet Commonly known as: VITAMIN D3 Take 5 tablets (5,000 Units total) by mouth daily.            Durable Medical Equipment  (From admission,  onward)         Start     Ordered   10/23/20 1305  For home use only DME lightweight manual wheelchair with seat cushion  Once       Comments: Patient suffers from weakness which impairs their ability to perform daily activities like dressing, bathing in the home.  A cane or walker will not resolve  issue with performing activities of daily living. A wheelchair will allow patient to safely perform daily activities. Patient is not able to propel themselves in the home using a standard weight wheelchair due to weakness. Patient can self propel in the lightweight wheelchair. Length of need lifetime. Accessories: elevating leg rests (ELRs), wheel locks, extensions and anti-tippers.   10/23/20 1306         No Known Allergies  Follow-up Information    [facility] Follow up.   Why: wheelchair Contact information: [address] [place] [address] [phone]                The results of significant diagnostics from this hospitalization (including imaging, microbiology, ancillary and laboratory) are listed below for reference.    Significant Diagnostic Studies: DG Chest 1 View  Result Date: 10/04/2020 CLINICAL DATA:  Shortness of breath. Status post recent thoracic aortic aneurysm repair. EXAM: CHEST  1 VIEW COMPARISON:  Chest radiograph 09/26/2020 and CT 09/27/2020 FINDINGS: Postoperative changes are noted from thoracic aortic aneurysm repair. A left PICC terminates near the superior cavoatrial junction, unchanged. The cardiac silhouette remains enlarged. Lung volumes are low. Fine interstitial densities throughout both lungs are stable to slightly improved. Asymmetric left basilar airspace opacity has decreased. There may be a persistent small left pleural effusion. No pneumothorax is identified. IMPRESSION: 1. Stable to slightly improved appearance of bilateral interstitial densities which could reflect mild edema superimposed on chronic lung disease. 2. Mildly  improved left basilar opacity, likely atelectasis. Electronically Signed   By: Logan Bores M.D.   On: 10/04/2020 11:21   DG Chest 2 View  Result Date: 10/18/2020 CLINICAL DATA:  Aortic dissection. Aortic dissection diagnosed 09/13/2020. Shortness of breath and weakness. EXAM: CHEST - 2 VIEW COMPARISON:  Most recent chest radiograph 10/04/2020. FINDINGS: Median sternotomy similar cardiomegaly. Unchanged mediastinal contours with aortic tortuosity. Improvement in left lung aeration from prior exam with mild residual patchy opacity. Minimal fluid in the fissures without large subpulmonic effusion. Vascular congestion with possible pulmonary edema and Kerley B-lines at the bases. No pneumothorax. IMPRESSION: 1. Improving left lung aeration from prior exam, with  mild residual patchy opacity, likely atelectasis. 2. Cardiomegaly with vascular congestion and possible pulmonary edema. 3. Stable mediastinal contours post aortic dissection repair. Electronically Signed   By: Keith Rake M.D.   On: 10/18/2020 16:39   CT Head Wo Contrast  Result Date: 10/19/2020 CLINICAL DATA:  Delirium. EXAM: CT HEAD WITHOUT CONTRAST TECHNIQUE: Contiguous axial images were obtained from the base of the skull through the vertex without intravenous contrast. COMPARISON:  CT head 09/13/2020, CT head 07/30/2003. FINDINGS: Brain: Cerebral ventricle sizes are concordant with the degree of cerebral volume loss. Patchy and confluent areas of decreased attenuation are noted throughout the deep and periventricular white matter of the cerebral hemispheres bilaterally, compatible with chronic microvascular ischemic disease. No evidence of large-territorial acute infarction. No parenchymal hemorrhage. No mass lesion. No extra-axial collection. No mass effect or midline shift. No hydrocephalus. Basilar cisterns are patent. Vascular: No hyperdense vessel. Atherosclerotic calcifications are present within the cavernous internal carotid arteries.  Skull: No acute fracture or focal lesion.Hyperostosis frontalis interna. Sinuses/Orbits: Right maxillary mucosal thickening. Left ethmoid sinus osteoma versus inspissated mucus similar to prior. Otherwise paranasal and mastoid air cells are clear. Bilateral lens replacement. Otherwise the orbits are unremarkable. Other: Dental hardware. IMPRESSION: No acute intracranial abnormality in a patient with microvascular ischemic changes. Electronically Signed   By: Iven Finn M.D.   On: 10/19/2020 18:25   CARDIAC CATHETERIZATION  Result Date: 09/27/2020 1. Normal right and left heart filling pressures. 2. Mild-moderate pulmonary arterial hypertension. 3. Preserved cardiac output.   US RENAL  Result Date: 10/20/2020 CLINICAL DATA:  Inpatient.  Acute kidney injury. EXAM: RENAL / URINARY TRACT ULTRASOUND COMPLETE COMPARISON:  10/19/2020 abdominal sonogram. FINDINGS: Right Kidney: Renal measurements: 10.0 x 4.3 x 4.2 cm = volume: 96 mL. Echogenic right renal parenchyma, normal thickness. No hydronephrosis. Simple 1.5 x 1.5 x 1.7 cm lower right renal cyst. Left Kidney: Renal measurements: 10.2 x 6.1 x 5.1 cm = volume: 166 mL. Echogenic left renal parenchyma, normal thickness. No hydronephrosis. No renal mass. Bladder: Appears normal for degree of bladder distention. Other: None. IMPRESSION: 1. No hydronephrosis. 2. Echogenic kidneys, compatible with the reported history of nonspecific acute renal parenchymal disease. 3. Small simple right renal cyst. 4. Normal bladder. Electronically Signed   By: Ilona Sorrel M.D.   On: 10/20/2020 17:23   CT Chest High Resolution  Result Date: 09/27/2020 CLINICAL DATA:  Inpatient. Dyspnea. Hypoxemia. Type A aortic dissection with ascending aortic repair 09/13/2020. EXAM: CT CHEST WITHOUT CONTRAST TECHNIQUE: Multidetector CT imaging of the chest was performed following the standard protocol without intravenous contrast. High resolution imaging of the lungs, as well as  inspiratory and expiratory imaging, was performed. COMPARISON:  09/13/2020 chest CT angiogram. Chest radiograph from one day prior. FINDINGS: Motion degraded scan, limiting assessment. Cardiovascular: Moderate cardiomegaly. Trace pericardial effusion/thickening, similar. Left anterior descending coronary atherosclerosis. Left PICC terminates at the cavoatrial junction. Interval ascending thoracic aortic graft repair. Luminally displaced atherosclerotic intimal calcifications throughout the aortic arch and descending thoracic aorta compatible with known type A dissection. No acute intramural hematoma in the thoracic aorta. Mildly dilated descending thoracic aorta up to 4.1 cm diameter, unchanged. Stable dilated main pulmonary artery (3.9 cm diameter). Mediastinum/Nodes: No discrete thyroid nodules. Unremarkable esophagus. Stable mildly enlarged 1.0 cm right axillary lymph node (series 5/image 41). No pathologically enlarged left axillary nodes. Right paratracheal adenopathy up to 1.6 cm (series 5/image 52), unchanged. No new pathologically enlarged mediastinal nodes. No discrete hilar adenopathy on these noncontrast images. Lungs/Pleura: No  pneumothorax. Small dependent bilateral pleural effusions, new on the right and stable on the left. Moderate passive atelectasis in the dependent lower lobes bilaterally, worsened bilaterally. Moderate to severe centrilobular emphysema with mild diffuse bronchial wall thickening. No lung masses or significant pulmonary nodules. No significant lobular air trapping or evidence of tracheobronchomalacia on the expiration sequence. No substantial regions of subpleural reticulation, ground-glass attenuation, traction bronchiectasis or architectural distortion on this limited scan. No frank honeycombing. Mild interlobular septal thickening throughout both lungs. Upper abdomen: No acute abnormality. Musculoskeletal: No aggressive appearing focal osseous lesions. Intact sternotomy wires.  Moderate thoracic spondylosis. IMPRESSION: 1. Limited motion degraded scan. No compelling evidence of underlying interstitial lung disease. 2. Evidence of mild congestive heart failure with cardiomegaly, mild interlobular septal thickening throughout both lungs suggesting mild pulmonary edema and small dependent bilateral pleural effusions, new on the right and stable on the left. 3. Moderate passive atelectasis in the dependent lower lobes, worsened bilaterally. 4. Stable dilated main pulmonary artery, suggesting chronic pulmonary arterial hypertension. 5. Moderate to severe centrilobular emphysema with mild diffuse bronchial wall thickening, suggesting COPD. 6. One vessel coronary atherosclerosis. 7. Stable mild right axillary and mediastinal lymphadenopathy, nonspecific. Follow-up chest CT with IV contrast suggested in 3-6 months. 8. Known type A aortic dissection status post interval ascending aortic graft repair, not well evaluated on this noncontrast CT study. 9. Aortic Atherosclerosis (ICD10-I70.0) and Emphysema (ICD10-J43.9). Electronically Signed   By: Ilona Sorrel M.D.   On: 09/27/2020 12:42   NM Hepato W/EF  Result Date: 10/22/2020 CLINICAL DATA:  Cholelithiasis.  Vomiting EXAM: NUCLEAR MEDICINE HEPATOBILIARY IMAGING WITH GALLBLADDER EF TECHNIQUE: Sequential images of the abdomen were obtained out to 60 minutes following intravenous administration of radiopharmaceutical. After oral ingestion of Ensure, gallbladder ejection fraction was determined. At 60 min, normal ejection fraction is greater than 33%. RADIOPHARMACEUTICALS:  5.1 mCi Tc-27m  Choletec IV COMPARISON:  CT 10/19/2020, ultrasound 10/19/2020 FINDINGS: Prompt clearance radiotracer from the blood pool and homogeneous uptake in liver. Counts are present in the small bowel by 20 minutes. Gallbladder begins to fill at 35 minutes. Filling defect within the central gallbladder consistent with gallstones. At 60 minutes, a fatty meal was  administered. The gallbladder failed to contracted appropriately after administration of fatty meal. Calculated gallbladder ejection fraction is 0%. Essentially no gallbladder emptying. (Normal gallbladder ejection fraction with Ensure is greater than 33%.) IMPRESSION: 1. Low gallbladder ejection fracture. Essentially no measurable gallbladder emptying. No gallbladder contraction. 2. Cholelithiasis. 3. Patent cystic duct and common bile duct Electronically Signed   By: Suzy Bouchard M.D.   On: 10/22/2020 16:21   DG Chest Port 1 View  Result Date: 10/19/2020 CLINICAL DATA:  Altered mental status EXAM: PORTABLE CHEST 1 VIEW COMPARISON:  10/18/2020 FINDINGS: Cardiac shadow is enlarged but stable. Postsurgical changes are noted. Tortuosity of the thoracic aorta is noted. The lungs are well aerated bilaterally. Mild basilar atelectasis on the left is seen. No new focal abnormality is noted. Previously seen vascular congestion has resolved. IMPRESSION: Left basilar atelectasis. Improved vascular congestion. Electronically Signed   By: Inez Catalina M.D.   On: 10/19/2020 15:36   DG CHEST PORT 1 VIEW  Result Date: 09/26/2020 CLINICAL DATA:  Congestive heart failure. EXAM: PORTABLE CHEST 1 VIEW COMPARISON:  September 24, 2020. FINDINGS: Stable cardiomegaly. No pneumothorax is noted. Right lung is clear. Mild left basilar atelectasis or infiltrate is noted. Bony thorax is unremarkable. Sternotomy wires are noted. Left-sided PICC line is unchanged in position. IMPRESSION: Mild left basilar  atelectasis or infiltrate is noted. Electronically Signed   By: Marijo Conception M.D.   On: 09/26/2020 10:33   DG CHEST PORT 1 VIEW  Result Date: 09/24/2020 CLINICAL DATA:  Dyspnea EXAM: PORTABLE CHEST 1 VIEW COMPARISON:  Multiple priors, most recent 09/23/2020 FINDINGS: Improved aeration lungs. Persistent left basilar opacities. Subtle chronic prominence of the central bronchovascular markings. No visible pleural effusions or  pneumothorax on this limited single AP portable radiograph. Similar enlarged cardiac silhouette. Left subclavian approach PICC with the tip projecting at the right atrium. IMPRESSION: Similar cardiomegaly. Overall, improved aeration of the lungs with persistent left basilar opacity, which may represent atelectasis, aspiration, and/or pneumonia. Electronically Signed   By: Margaretha Sheffield MD   On: 09/24/2020 10:34   MR CARDIAC MORPHOLOGY W WO CONTRAST  Result Date: 09/25/2020 CLINICAL DATA:  RV failure EXAM: CARDIAC MRI TECHNIQUE: The patient was scanned on a 1.5 Tesla GE magnet. A dedicated cardiac coil was used. Functional imaging was done using Fiesta sequences. 2,3, and 4 chamber views were done to assess for RWMA's. Modified Simpson's rule using a short axis stack was used to calculate an ejection fraction on a dedicated work Conservation officer, nature. The patient received 10 cc of Gadavist. 10 minutes inversion recovery sequences were used to assess for infiltration and scar tissue. CONTRAST:  Gadavist 10 cc FINDINGS: S/p sternotomy. There was artifact from the pacing wires. Small bilateral pleural effusions with associated atelectasis on the left. S/p ascending aorta and arch replacement with residual dissection noted in the descending thoracic aorta. Normal left ventricular size and wall thickness. Normal wall motion with EF 66%. D-shaped septum suggestive of RV pressure/volume overload. RV moderately dilated and moderately dysfunctional, EF 36%. Moderate left and right atrial enlargement. There was probably moderate tricuspid regurgitation. No mitral regurgitation. Tricuspid aortic valve. suspect mild aortic insufficiency with no significant stenosis. Flow images to quantify aortic insufficiency were degraded by artifact from pacing wires. Delayed enhancement images were difficult due to artifact, but I did not see any definitive late gadolinium enhancement (LGE). Measurements: Calculated LV EF  66% (volumes not accurate). Calculated RV EF 36% (volumes not accurate. IMPRESSION: 1. Technically difficult study with significant artifact from pacing wires. 2.  Normal LV size with EF 66%, normal wall motion. 3. Moderately dilated RV with EF 36%, mild-moderate systolic dysfunction. 4. Delayed enhancement images were difficult due to artifact, but no definite LGE was noted. Therefore, no definitive evidence for prior MI, myocarditis, or infiltrative disease. Dalton Mclean Electronically Signed   By: Loralie Champagne M.D.   On: 09/25/2020 18:14   CT Renal Stone Study  Result Date: 10/19/2020 CLINICAL DATA:  Bilateral flank pain EXAM: CT ABDOMEN AND PELVIS WITHOUT CONTRAST TECHNIQUE: Multidetector CT imaging of the abdomen and pelvis was performed following the standard protocol without IV contrast. COMPARISON:  September 13, 2020 FINDINGS: Lower chest: There is unchanged cardiomegaly with aortic knob calcifications. A tortuous descending aorta with basilar patchy airspace opacity at the posterior left lung base. No hiatal hernia. Hepatobiliary: Although limited due to the lack of intravenous contrast, normal in appearance without gross focal abnormality. Layering calcified gallstones are present. Pancreas:  Unremarkable.  No surrounding inflammatory changes. Spleen: Normal in size. Although limited due to the lack of intravenous contrast, normal in appearance. Adrenals/Urinary Tract: Both adrenal glands appear normal. The kidneys and collecting system appear normal without evidence of urinary tract calculus or hydronephrosis. Bladder is unremarkable. Stomach/Bowel: The stomach and small bowel. There is a moderate amount  stool throughout. There is mildly air and stool-filled distended cecum and transverse colon measuring up to 7.4 cm. No focal transition point or wall thickening is noted. Scattered colonic diverticula are noted. Vascular/Lymphatic: There are no enlarged abdominal or pelvic lymph nodes. Scattered  aortic atherosclerosis with a tortuous appearance is. Reproductive: The patient is status post hysterectomy. No adnexal masses or collections seen. Other: No evidence of abdominal wall mass or hernia. Musculoskeletal: No acute or significant osseous findings. IMPRESSION: Mildly dilated stool and air-filled cecum and transverse colon which could be due to ileus or partial small bowel obstruction. No focal transition point or wall thickening. Cholelithiasis Aortic Atherosclerosis (ICD10-I70.0). Electronically Signed   By: Prudencio Pair M.D.   On: 10/19/2020 18:02   ECHOCARDIOGRAM LIMITED  Result Date: 10/20/2020    ECHOCARDIOGRAM LIMITED REPORT   Patient Name:   CANDAS DEEMER Gasca Date of Exam: 10/20/2020 Medical Rec #:  [id number]           Height:       64.5 in Accession #:    [id number]          Weight:       146.4 lb Date of Birth:  [birth year]            BSA:          1.723 m Patient Age:    6 years            BP:           116/74 mmHg Patient Gender: F                   HR:           110 bpm. Exam Location:  Inpatient Procedure: Limited Echo, Color Doppler and Cardiac Doppler Indications:     pre-operative exam  History:         Patient has prior history of Echocardiogram examinations, most                  recent 09/19/2020. Aortic dissection repair; Risk                  Factors:Hypertension, Dyslipidemia and Diabetes.  Sonographer:     Johny Chess Referring Phys:  [place] Diagnosing Phys: Ena Dawley MD IMPRESSIONS  1. Left ventricular ejection fraction, by estimation, is 55 to 60%. The left ventricle has normal function. Left ventricular diastolic parameters are consistent with Grade I diastolic dysfunction (impaired relaxation).  2. Right ventricular systolic function is moderately reduced. The right ventricular size is moderately enlarged. There is moderately elevated pulmonary artery systolic pressure. The estimated right ventricular systolic pressure is 40.9 mmHg.  3. Left atrial size  was moderately dilated.  4. Right atrial size was moderately dilated.  5. Tricuspid valve regurgitation is moderate to severe.  6. Aortic valve regurgitation is moderate. FINDINGS  Left Ventricle: Left ventricular ejection fraction, by estimation, is 55 to 60%. The left ventricle has normal function. Left ventricular diastolic parameters are consistent with Grade I diastolic dysfunction (impaired relaxation). Right Ventricle: The right ventricular size is moderately enlarged. Right ventricular systolic function is moderately reduced. There is moderately elevated pulmonary artery systolic pressure. The tricuspid regurgitant velocity is 3.46 m/s, and with an assumed right atrial pressure of 3 mmHg, the estimated right ventricular systolic pressure is 73.5 mmHg. Left Atrium: Left atrial size was moderately dilated. Right Atrium: Right atrial size was moderately dilated. Tricuspid Valve: Tricuspid valve regurgitation is moderate to  severe. Aortic Valve: Aortic valve regurgitation is moderate. Aortic regurgitation PHT measures 275 msec. LEFT VENTRICLE PLAX 2D LVIDd:         3.80 cm LVIDs:         2.60 cm LV PW:         1.20 cm LV IVS:        0.90 cm  LV Volumes (MOD) LV vol d, MOD A2C: 39.1 ml LV vol s, MOD A2C: 22.7 ml LV SV MOD A2C:     16.4 ml IVC IVC diam: 1.50 cm LEFT ATRIUM         Index LA diam:    4.50 cm 2.61 cm/m  AORTIC VALVE LVOT Vmax:   158.00 cm/s LVOT Vmean:  102.000 cm/s LVOT VTI:    0.242 m AI PHT:      275 msec MR Peak grad: 119.7 mmHg    TRICUSPID VALVE MR Mean grad: 75.0 mmHg     TR Peak grad:   47.9 mmHg MR Vmax:      547.00 cm/s   TR Vmax:        346.00 cm/s MR Vmean:     391.0 cm/s MV E velocity: 69.20 cm/s   SHUNTS MV A velocity: 148.00 cm/s  Systemic VTI: 0.24 m MV E/A ratio:  0.47 Ena Dawley MD Electronically signed by Ena Dawley MD Signature Date/Time: 10/20/2020/4:38:28 PM    Final (Updated)    US Abdomen Limited RUQ (LIVER/GB)  Result Date: 10/19/2020 CLINICAL DATA:  Right side  abdominal pain EXAM: ULTRASOUND ABDOMEN LIMITED RIGHT UPPER QUADRANT COMPARISON:  02/29/2020 FINDINGS: Gallbladder: Multiple stones within the gallbladder. 9 mm stone in the region of the gallbladder neck is non mobile. Gallbladder wall normal thickness at 2.6 mm. No pericholecystic fluid. Common bile duct: Diameter: Normal caliber, 3 mm. Liver: Heterogeneous echotexture. No mass or biliary ductal dilatation. Portal vein is patent on color Doppler imaging with normal direction of blood flow towards the liver. Other: None. IMPRESSION: Cholelithiasis. 9 mm gallstone non mobile in the gallbladder neck region. No sonographic evidence of acute cholecystitis. Electronically Signed   By: Rolm Baptise M.D.   On: 10/19/2020 20:20    Microbiology: Recent Results (from the past 240 hour(s))  Resp Panel by RT PCR (RSV, Flu A&B, Covid) - Nasopharyngeal Swab     Status: None   Collection Time: 10/19/20  3:21 PM   Specimen: Nasopharyngeal Swab  Result Value Ref Range Status   SARS Coronavirus 2 by RT PCR NEGATIVE NEGATIVE Final    Comment: (NOTE) SARS-CoV-2 target nucleic acids are NOT DETECTED.  The SARS-CoV-2 RNA is generally detectable in upper respiratoy specimens during the acute phase of infection. The lowest concentration of SARS-CoV-2 viral copies this assay can detect is 131 copies/mL. A negative result does not preclude SARS-Cov-2 infection and should not be used as the sole basis for treatment or other patient management decisions. A negative result may occur with  improper specimen collection/handling, submission of specimen other than nasopharyngeal swab, presence of viral mutation(s) within the areas targeted by this assay, and inadequate number of viral copies (<131 copies/mL). A negative result must be combined with clinical observations, patient history, and epidemiological information. The expected result is Negative.  Fact Sheet for Patients:   PinkCheek.be  Fact Sheet for Healthcare Providers:  GravelBags.it  This test is no t yet approved or cleared by the [place] FDA and  has been authorized for detection and/or diagnosis of SARS-CoV-2 by FDA under an Emergency  Use Authorization (EUA). This EUA will remain  in effect (meaning this test can be used) for the duration of the COVID-19 declaration under Section 564(b)(1) of the Act, 21 U.S.C. section 360bbb-3(b)(1), unless the authorization is terminated or revoked sooner.     Influenza A by PCR NEGATIVE NEGATIVE Final   Influenza B by PCR NEGATIVE NEGATIVE Final    Comment: (NOTE) The Xpert Xpress SARS-CoV-2/FLU/RSV assay is intended as an aid in  the diagnosis of influenza from Nasopharyngeal swab specimens and  should not be used as a sole basis for treatment. Nasal washings and  aspirates are unacceptable for Xpert Xpress SARS-CoV-2/FLU/RSV  testing.  Fact Sheet for Patients: PinkCheek.be  Fact Sheet for Healthcare Providers: GravelBags.it  This test is not yet approved or cleared by the [place] FDA and  has been authorized for detection and/or diagnosis of SARS-CoV-2 by  FDA under an Emergency Use Authorization (EUA). This EUA will remain  in effect (meaning this test can be used) for the duration of the  Covid-19 declaration under Section 564(b)(1) of the Act, 21  U.S.C. section 360bbb-3(b)(1), unless the authorization is  terminated or revoked.    Respiratory Syncytial Virus by PCR NEGATIVE NEGATIVE Final    Comment: (NOTE) Fact Sheet for Patients: PinkCheek.be  Fact Sheet for Healthcare Providers: GravelBags.it  This test is not yet approved or cleared by the [place] FDA and  has been authorized for detection and/or diagnosis of SARS-CoV-2 by  FDA under an Emergency  Use Authorization (EUA). This EUA will remain  in effect (meaning this test can be used) for the duration of the  COVID-19 declaration under Section 564(b)(1) of the Act, 21 U.S.C.  section 360bbb-3(b)(1), unless the authorization is terminated or  revoked. Performed at [facility], [place] [address], [place], [place] [address]   Urine culture     Status: Abnormal   Collection Time: 10/19/20  3:57 PM   Specimen: Urine, Random  Result Value Ref Range Status   Specimen Description URINE, RANDOM  Final   Special Requests NONE  Final   Culture (A)  Final    <10,000 COLONIES/mL INSIGNIFICANT GROWTH Performed at [facility], [place] [address] [place], [place] [address]    Report Status 10/21/2020 FINAL  Final  Culture, blood (routine x 2)     Status: None (Preliminary result)   Collection Time: 10/19/20  4:22 PM   Specimen: BLOOD  Result Value Ref Range Status   Specimen Description BLOOD SITE NOT SPECIFIED  Final   Special Requests   Final    BOTTLES DRAWN AEROBIC AND ANAEROBIC Blood Culture adequate volume   Culture   Final    NO GROWTH 4 DAYS Performed at [facility], [place] [address], [place], [place] [address]    Report Status PENDING  Incomplete  Culture, blood (routine x 2)     Status: None (Preliminary result)   Collection Time: 10/19/20  4:22 PM   Specimen: BLOOD  Result Value Ref Range Status   Specimen Description BLOOD SITE NOT SPECIFIED  Final   Special Requests   Final    BOTTLES DRAWN AEROBIC AND ANAEROBIC Blood Culture adequate volume   Culture   Final    NO GROWTH 4 DAYS Performed at [facility], [address] [place],  [address]    Report Status PENDING  Incomplete     Labs: Basic Metabolic Panel: Recent Labs  Lab 10/19/20 1300 10/20/20 0901 10/21/20  5465 10/22/20 0821 10/23/20 0023  NA 135 138 140 140 140  K 4.5 3.9 4.2 3.4* 3.1*  CL 99 102 105 105 106  CO2 24 23 23 25 26   GLUCOSE 153* 125* 100* 87 109*  BUN 24* 31*  30* 30* 34*  CREATININE 1.86* 1.90* 1.59* 1.64* 1.59*  CALCIUM 9.6 9.2 9.1 9.0 8.9   Liver Function Tests: Recent Labs  Lab 10/20/20 0901 10/21/20 0851 10/22/20 0821 10/23/20 0023  AST 84*  84* 64* 53* 41  ALT 63*  65* 48* 39 35  ALKPHOS 66  68 72 63 65  BILITOT 1.5*  1.3* 0.9 0.9 0.9  PROT 7.4  7.3 6.6 6.4* 6.4*  ALBUMIN 2.1*  2.1* 1.8* 1.8* 1.6*   Recent Labs  Lab 10/19/20 1549  LIPASE 49   No results for input(s): AMMONIA in the last 168 hours. CBC: Recent Labs  Lab 10/19/20 2010 10/20/20 0901 10/21/20 0851 10/22/20 0821 10/23/20 0023  WBC 25.5* 24.2* 21.4* 16.0* 13.0*  NEUTROABS 22.9*  --  18.9* 13.9* 10.8*  HGB 7.0* 7.9*  7.8* 7.5* 7.3* 7.3*  HCT 24.1* 26.3*  26.4* 25.7* 24.8* 25.2*  MCV 70.7* 70.7* 71.6* 71.7* 72.0*  PLT 467* 388 329 320 321   Cardiac Enzymes: No results for input(s): CKTOTAL, CKMB, CKMBINDEX, TROPONINI in the last 168 hours. BNP: BNP (last 3 results) Recent Labs    09/13/20 1129 10/19/20 1522  BNP 388.1* 580.5*    ProBNP (last 3 results) No results for input(s): PROBNP in the last 8760 hours.  CBG: Recent Labs  Lab 10/22/20 0751 10/22/20 1125 10/22/20 1734 10/22/20 2053 10/23/20 0648  GLUCAP 88 77 147* 185* 95       Signed:  Nita Sells MD   [facility] 10/23/2020, 3:24 PM

## 2020-10-23 NOTE — Progress Notes (Signed)
[facility] Progress Note     Subjective: CC-  Daughter at bedside. Sitting up in bed eating breakfast. Continues to have some intermittent abdominal discomfort, but it is much better than upon admission. Tolerating diet. Passing flatus and had a good BM this AM. No n/v. WBC down 13, TMAX 99.8, LFTs WNL. HIDA negative for acute cholecystitis, possible biliary dyskinesia.   Objective: Vital signs in last 24 hours: Temp:  [96.6 F (35.9 C)-99.8 F (37.7 C)] 96.6 F (35.9 C) (11/09 0724) Pulse Rate:  [80-112] 80 (11/09 0721) Resp:  [16-36] 20 (11/09 0721) BP: (125-142)/(70-78) 125/74 (11/09 0721) SpO2:  [96 %-99 %] 98 % (11/09 0725) Weight:  [67.5 kg] 67.5 kg (11/09 0359) Last BM Date: 10/22/20  Intake/Output from previous day: 11/08 0701 - 11/09 0700 In: 500 [IV Piggyback:500] Out: 550 [Urine:550] Intake/Output this shift: No intake/output data recorded.  PE: Gen:  Alert, NAD, pleasant Card:  RRR Pulm:  CTAB, no W/R/R, rate and effort normal Abd: Soft, nondistended, nontender, no rebound or guarding, +BS, negative Murphy sign Skin: no rashes noted, warm and dry   Lab Results:  Recent Labs    10/22/20 0821 10/23/20 0023  WBC 16.0* 13.0*  HGB 7.3* 7.3*  HCT 24.8* 25.2*  PLT 320 321   BMET Recent Labs    10/22/20 0821 10/23/20 0023  NA 140 140  K 3.4* 3.1*  CL 105 106  CO2 25 26  GLUCOSE 87 109*  BUN 30* 34*  CREATININE 1.64* 1.59*  CALCIUM 9.0 8.9   PT/INR No results for input(s): LABPROT, INR in the last 72 hours. CMP     Component Value Date/Time   NA 140 10/23/2020 0023   K 3.1 (L) 10/23/2020 0023   CL 106 10/23/2020 0023   CO2 26 10/23/2020 0023   GLUCOSE 109 (H) 10/23/2020 0023   BUN 34 (H) 10/23/2020 0023   CREATININE 1.59 (H) 10/23/2020 0023   CALCIUM 8.9 10/23/2020 0023   PROT 6.4 (L) 10/23/2020 0023   ALBUMIN 1.6 (L) 10/23/2020 0023   AST 41 10/23/2020 0023   ALT 35 10/23/2020 0023   ALKPHOS 65 10/23/2020 0023   BILITOT  0.9 10/23/2020 0023   GFRNONAA 32 (L) 10/23/2020 0023   GFRAA 44 (L) 09/18/2020 0405   Lipase     Component Value Date/Time   LIPASE 49 10/19/2020 1549       Studies/Results: NM Hepato W/EF  Result Date: 10/22/2020 CLINICAL DATA:  Cholelithiasis.  Vomiting EXAM: NUCLEAR MEDICINE HEPATOBILIARY IMAGING WITH GALLBLADDER EF TECHNIQUE: Sequential images of the abdomen were obtained out to 60 minutes following intravenous administration of radiopharmaceutical. After oral ingestion of Ensure, gallbladder ejection fraction was determined. At 60 min, normal ejection fraction is greater than 33%. RADIOPHARMACEUTICALS:  5.1 mCi Tc-58m  Choletec IV COMPARISON:  CT 10/19/2020, ultrasound 10/19/2020 FINDINGS: Prompt clearance radiotracer from the blood pool and homogeneous uptake in liver. Counts are present in the small bowel by 20 minutes. Gallbladder begins to fill at 35 minutes. Filling defect within the central gallbladder consistent with gallstones. At 60 minutes, a fatty meal was administered. The gallbladder failed to contracted appropriately after administration of fatty meal. Calculated gallbladder ejection fraction is 0%. Essentially no gallbladder emptying. (Normal gallbladder ejection fraction with Ensure is greater than 33%.) IMPRESSION: 1. Low gallbladder ejection fracture. Essentially no measurable gallbladder emptying. No gallbladder contraction. 2. Cholelithiasis. 3. Patent cystic duct and common bile duct Electronically Signed   By: Suzy Bouchard M.D.   On: 10/22/2020  16:21    Anti-infectives: Anti-infectives (From admission, onward)   Start     Dose/Rate Route Frequency Ordered Stop   10/21/20 1800  vancomycin (VANCOCIN) IVPB 1000 mg/200 mL premix  Status:  Discontinued        1,000 mg 200 mL/hr over 60 Minutes Intravenous Every 48 hours 10/19/20 2216 10/20/20 1406   10/20/20 2200  cefTRIAXone (ROCEPHIN) 2 g in sodium chloride 0.9 % 100 mL IVPB        2 g 200 mL/hr over 30 Minutes  Intravenous Every 24 hours 10/20/20 1443     10/19/20 2300  ceFEPIme (MAXIPIME) 2 g in sodium chloride 0.9 % 100 mL IVPB  Status:  Discontinued        2 g 200 mL/hr over 30 Minutes Intravenous Every 24 hours 10/19/20 2216 10/20/20 1443   10/19/20 2145  metroNIDAZOLE (FLAGYL) IVPB 500 mg        500 mg 100 mL/hr over 60 Minutes Intravenous Every 8 hours 10/19/20 2137     10/19/20 1600  vancomycin (VANCOCIN) IVPB 1000 mg/200 mL premix        1,000 mg 200 mL/hr over 60 Minutes Intravenous  Once 10/19/20 1555 10/19/20 1914   10/19/20 1600  piperacillin-tazobactam (ZOSYN) IVPB 3.375 g        3.375 g 100 mL/hr over 30 Minutes Intravenous  Once 10/19/20 1555 10/19/20 1941       Assessment/Plan DM Cor pulmonale, pulmonary HTN COPD OSA Right heart failure Type I aortic dissection s/p repair 09/13/2020  Anemia - Hgb 7.3. decreased saturations and low iron AKI on CKD Elevated transaminases   Abdominal pain Cholelithiasis - HIDA negative for acute cholecystitis, possible biliary dyskinesia  - No indication for cholecystectomy this admission. PO challenge. If patient tolerates a diet she could be discharged home from surgical standpoint. She and family are moving to [place] as soon as she gets discharged. We discussed that she could follow up with surgery as outpatient to discuss role of elective cholecystectomy, but she would need to be cleared by a medical doctor/cardiology. We will sign off, please call with concerns.   ID - currently rocephin/flagyl 11/6>> FEN - IVF, HH/CM diet VTE - SCDs, ok for chemical DVT prophylaxis from surgical standpoint Foley - none Follow up - TBD    LOS: 4 days    [place], [facility] 10/23/2020, 8:40 AM Please see Amion for pager number during day hours 7:00am-4:30pm

## 2020-10-23 NOTE — TOC Transition Note (Addendum)
Transition of Care [facility]) - CM/SW Discharge Note   Patient Details  Name: Danielle Atkins MRN: [id number] Date of Birth: 06/11/1935  Transition of Care [facility]) CM/SW Contact:  Danielle Mayo, RN Phone Number: 10/23/2020, 4:07 PM   Clinical Narrative:    NCM received call from patient's daughter Danielle Atkins, she states patient will be going to [place] on Friday or Saturday to stay with her son Danielle Atkins at address [place] [address].  His cell is [phone], home phone [phone] his wife name is Danielle Atkins.  Patient will need home oxygen and lightweight wheelchair.  NCM made referral to [facility] with [employer] for the lightweight w/chair and made referral to Danielle Atkins with [employer] for the home oxygen.  [employer] will bring oxygen tank to the hospital this evening or in am for patient to go home with tomorrow- which will be the hotel address with Danielle Atkins at  [facility], [place] [facility] [phone], room [address]. Danielle Atkins with [employer] states that they will set up a concentrator at the hotel and give her a oxygen tank to go home with tomorrow and will give her tanks to travel to [place] with.  When she gets to [place] she can follow up with [employer] in [place] at [phone].  NCM asked MD for oxygen   11/10- NCM spoke with Danielle Atkins at [facility], she states they will contact the daughter this am to see if they can get the oxygen set up this am and then daughter bring tank to the hospital when she picks her up. Daughter states patient will be going to [place] tomorrow, she would like for this NCM to find [facility] agency for [place] for her she does not have a preference of the agency. NCM contacted [facility] in the [place] area at [phone],  Fax is [phone].  NCM received information from Danielle Atkins at [facility] in [place] that patient must have a [place] PCP before they can see her.  She states that if the family wants to have [place] [facility] calls at home they can set this up  for the patient if she can not get out of the house, or they can just set her up with another MD.  NCM informed Danielle Atkins the daughter of this information and gave her the [facility] phone number.       Final next level of care: Home/Self Care Barriers to Discharge: Continued Medical Work up   Patient Goals and CMS Choice Patient states their goals for this hospitalization and ongoing recovery are:: home with son in [place]   Choice offered to / list presented to : NA  Discharge Placement                       Discharge Plan and Services   Discharge Planning Services: CM Consult Post Acute Care Choice: Durable Medical Equipment          DME Arranged: Lightweight manual wheelchair with seat cushion, Oxygen (oxygen with [employer]) DME Agency: [employer] Date DME Agency Contacted: 10/23/20 Time DME Agency Contacted: [phone] Representative spoke with at DME Agency: Danielle Atkins with [employer] and Danielle Atkins with [employer] HH Arranged: NA          Social Determinants of Health ([place]) Interventions     Readmission Risk Interventions Readmission Risk Prevention Plan 10/23/2020  Transportation Screening Complete  HRI or [place] Complete  Social Work Consult for Recovery  Care Planning/Counseling Complete  Palliative Care Screening Not Applicable  Medication Review (RN Care Manager) Complete  Some recent data might be hidden

## 2020-10-23 NOTE — Progress Notes (Signed)
Patient seen and examined on stabilizing well hope that in the next 24 hours can discharge home See my discharge summary for tomorrow 10/24/2020

## 2020-10-23 NOTE — Evaluation (Signed)
[facility] Evaluation Patient Details Name: Danielle Atkins MRN: [id number] DOB: [birth year] Today's Date: 10/23/2020   History of Present Illness  85 y/o F admitted on 11/5 for R flank pain, which has been determined to not need surgical intervention currently. PMH includes HTN, HLD, upper GIB, DM, gout, CKD, R heart failure, aortic dissection and aneurysm, and anemia  Clinical Impression  Pt presents to PT in recliner on room air and was unable to maintain SpO2 above 90%, so she was put on 1L. After about 50 ft of gait, pt required an additional liter of O2 and after this was able to maintain SpO2 in a therapeutic range. Pt would benefit from skilled acute PT in order to increase functional mobility while maintain SpO2 in a therapeutic range as well as work on LE endurance and strength. -Resting in chair on room air: 87% at 90 -Gait with 1L: 87% -Gait with 2L: maintained >90% at highest HR of 124 bpm -Resting in chair after tx on 1L O2: at 96% and HR of 85 bpm    Follow Up Recommendations Home health PT    Equipment Recommendations  3in1 (PT);Rolling walker with 5" wheels    Recommendations for Other Services OT consult     Precautions / Restrictions Precautions Precaution Comments: watch sats Restrictions Weight Bearing Restrictions: No      Mobility  Bed Mobility               General bed mobility comments: not assessed; in chair on arrival and in chair at end of session Patient Response: Cooperative  Transfers Overall transfer level: Needs assistance Equipment used: Rolling walker (2 wheeled) Transfers: Sit to/from Stand Sit to Stand: Min assist         General transfer comment: min assist to cue to place at least one hand on chair when standing  Ambulation/Gait Ambulation/Gait assistance: Min guard Gait Distance (Feet): 200 Feet Assistive device: Rolling walker (2 wheeled) Gait Pattern/deviations: Decreased stride length;Step-through  pattern Gait velocity: decreased   General Gait Details: min guard for safety and cue to breathe in through her nose and out through her mouth  Stairs            Wheelchair Mobility    Modified Rankin (Stroke Patients Only)       Balance Overall balance assessment: Needs assistance Sitting-balance support: Bilateral upper extremity supported   Sitting balance - Comments: not formally tested; pt was sitting in recliner     Standing balance-Leahy Scale: Fair Standing balance comment: pt used BUE support through RW for dynamic standing balance challenges                             Pertinent Vitals/Pain Pain Assessment: 0-10 Pain Score: 6  Pain Location: R flank Pain Descriptors / Indicators: Aching Pain Intervention(s): Monitored during session    Home Living Family/patient expects to be discharged to:: Private residence Living Arrangements: Children Available Help at Discharge: Available 24 hours/day;Family Type of Home: [place]: Multi-level (3 levels) Home Equipment: Walker - 2 wheels;Grab bars - toilet;Grab bars - tub/shower;Wheelchair - manual Additional Comments: sons wife does all the cooking and cleaning    Prior Function Level of Independence: Independent         Comments: every year takes an international trip with her daughters (pre-covid)     Hand Dominance        Extremity/Trunk Assessment  Upper Extremity Assessment Upper Extremity Assessment: Defer to OT evaluation    Lower Extremity Assessment Lower Extremity Assessment: Overall WFL for tasks assessed       Communication   Communication: No difficulties  Cognition Arousal/Alertness: Awake/alert Behavior During Therapy: WFL for tasks assessed/performed Overall Cognitive Status: Impaired/Different from baseline Area of Impairment: Orientation                 Orientation Level: Disoriented to;Time             General Comments: said it  was October instead of November      General Comments      Exercises     Assessment/Plan    PT Assessment Patient needs continued PT services  PT Problem List Decreased mobility;Decreased safety awareness;Decreased activity tolerance;Decreased balance;Decreased knowledge of use of DME;Decreased knowledge of precautions;Decreased strength;Cardiopulmonary status limiting activity       PT Treatment Interventions Gait training;Stair training;Functional mobility training;Therapeutic activities;Patient/family education;DME instruction;Therapeutic exercise    PT Goals (Current goals can be found in the Care Plan section)  Acute Rehab PT Goals Patient Stated Goal: return to gambling at the casino, travel PT Goal Formulation: With patient Time For Goal Achievement: 11/06/20 Potential to Achieve Goals: Good Additional Goals Additional Goal #1: Pt will keep SpO2 above 89% during functional mobility and gait on 2L of oxygen    Frequency Min 3X/week   Barriers to discharge        Co-evaluation               AM-PAC PT "6 Clicks" Mobility  Outcome Measure Help needed turning from your back to your side while in a flat bed without using bedrails?: A Little Help needed moving from lying on your back to sitting on the side of a flat bed without using bedrails?: A Little Help needed moving to and from a bed to a chair (including a wheelchair)?: A Little Help needed standing up from a chair using your arms (e.g., wheelchair or bedside chair)?: A Little Help needed to walk in hospital room?: A Little Help needed climbing 3-5 steps with a railing? : A Lot 6 Click Score: 17    End of Session Equipment Utilized During Treatment: Gait belt Activity Tolerance: Patient tolerated treatment well Patient left: in chair;with call bell/phone within reach Nurse Communication: Other (comment);Mobility status (need for O2 with mobility) PT Visit Diagnosis: Other abnormalities of gait and  mobility (R26.89);Difficulty in walking, not elsewhere classified (R26.2)    Time: 4827-0786 PT Time Calculation (min) (ACUTE ONLY): 37 min   Charges:   PT Evaluation $PT Eval Moderate Complexity: 1 Mod PT Treatments $Therapeutic Activity: 8-22 mins        Caleb Popp, SPT [id number]  Greydon Betke 10/23/2020, 3:46 PM

## 2020-10-23 NOTE — TOC Initial Note (Signed)
Transition of Care [facility]) - Initial/Assessment Note    Patient Details  Name: Danielle Atkins MRN: [id number] Date of Birth: [birth year]  [facility] of [facility]) CM/SW Contact:    Zenon Mayo, RN Phone Number: 10/23/2020, 3:59 PM  Clinical Narrative:                 NCM received call from patient's daughter [name], she states patient will be going to [place] on Friday or Saturday to stay with her son Atleigh Gruen at address [place] [address].  His cell is [phone], home phone [phone] his wife name is [name].  Patient will need home oxygen and lightweight wheelchair.  NCM made referral to [facility] with [employer] for the lightweight w/chair and made referral to Gilda with [employer] for the home oxygen.  [employer] will bring oxygen tank to the hospital this evening or in am for patient to go home with tomorrow- which will be the hotel address with [name] at  [facility], [place] [facility] [phone], room 511. Gilda with [employer] states that they will set up a concentrator at the hotel and give her a oxygen tank to go home with tomorrow and will give her tanks to travel to [place] with.  When she gets to [place] she can follow up with [employer] in [place] at [phone].  NCM asked MD for oxygen order.   Expected Discharge Plan: Home/Self Care Barriers to Discharge: Continued Medical Work up   Patient Goals and CMS Choice Patient states their goals for this hospitalization and ongoing recovery are:: home with son in [place]   Choice offered to / list presented to : NA  Expected Discharge Plan and Services Expected Discharge Plan: Home/Self Care   Discharge Planning Services: CM Consult Post Acute Care Choice: Durable Medical Equipment Living arrangements for the past 2 months: Single Family Home                 DME Arranged: Lightweight manual wheelchair with seat cushion, Oxygen (oxygen with [employer]) DME Agency:  [facility] Date DME Agency Contacted: 10/23/20 Time DME Agency Contacted: 0938 Representative spoke with at DME Agency: Adela Lank with [employer] and Gilda with [facility] Arranged: NA          Prior Living Arrangements/Services Living arrangements for the past 2 months: Single Family Home Lives with:: Self Patient language and need for interpreter reviewed:: Yes Do you feel safe going back to the place where you live?: Yes      Need for Family Participation in Patient Care: Yes (Comment) Care giver support system in place?: Yes (comment)   Criminal Activity/Legal Involvement Pertinent to Current Situation/Hospitalization: No - Comment as needed  Activities of Daily Living Home Assistive Devices/Equipment: Walker (specify type) ADL Screening (condition at time of admission) Patient's cognitive ability adequate to safely complete daily activities?: No Is the patient deaf or have difficulty hearing?: No Does the patient have difficulty seeing, even when wearing glasses/contacts?: No Does the patient have difficulty concentrating, remembering, or making decisions?: Yes Patient able to express need for assistance with ADLs?: Yes Does the patient have difficulty dressing or bathing?: Yes Independently performs ADLs?: No Communication: Independent Dressing (OT): Needs assistance Is this a change from baseline?: Pre-admission baseline Grooming: Independent Feeding: Independent Bathing: Needs assistance Is this a change from baseline?: Pre-admission baseline Toileting: Needs assistance Is this a change from baseline?: Pre-admission baseline In/Out Bed: Needs assistance Is this  a change from baseline?: Pre-admission baseline Walks in Home: Needs assistance Is this a change from baseline?: Pre-admission baseline Does the patient have difficulty walking or climbing stairs?: Yes Weakness of Legs: Both Weakness of Arms/Hands: Both  Permission Sought/Granted                   Emotional Assessment       Orientation: : Oriented to Self, Oriented to Place, Oriented to  Time Alcohol / Substance Use: Not Applicable Psych Involvement: No (comment)  Admission diagnosis:  Delirium [R41.0] Encephalopathy acute [G93.40] Right flank pain [R10.9] Right sided abdominal pain [R10.9] Patient Active Problem List   Diagnosis Date Noted  . Encephalopathy acute 10/19/2020  . Acute renal failure superimposed on stage 3 chronic kidney disease ([place]) 10/19/2020  . SIRS (systemic inflammatory response syndrome) ([place]) 10/19/2020  . CKD (chronic kidney disease), stage III ([place])   . Diabetes mellitus ([place])   . Former tobacco use   . HTN (hypertension)   . Hyperlipidemia   . RBBB   . TIA (transient ischemic attack)   . Upper GI bleed   . Right heart failure ([place]) 09/20/2020  . Aortic dissection ([place]) 09/14/2020  . Aortic aneurysm ([place]) 09/13/2020  . Piriformis syndrome, left 09/29/2019  . CKD (chronic kidney disease) stage 3, GFR 30-59 ml/min (HCC) 09/01/2019  . Diabetes mellitus with renal complications ([place]) 75/91/6384  . GOUT 11/16/2009  . PARESTHESIA, HANDS 03/27/2009  . Pure hypercholesterolemia 06/25/2007  . Microcytic anemia 06/25/2007  . Essential hypertension 06/25/2007  . PUD (peptic ulcer disease) 06/25/2007  . Disorder resulting from impaired renal function 06/25/2007  . History of cardiovascular disorder 06/25/2007  . ANEMIA-NOS 06/25/2007   PCP:  [name], [name], MD Pharmacy:   [id number] [place], [place] [address] [place] [address] Phone: [phone] Fax: [phone]     Social Determinants of Health (SDOH) Interventions    Readmission Risk Interventions Readmission Risk Prevention Plan 10/23/2020  Transportation Screening Complete  HRI or [place] Complete  Social Work Consult for [place] Planning/Counseling Complete  Palliative Care Screening Not Applicable   Medication Review Press photographer) Complete  Some recent data might be hidden

## 2020-10-23 NOTE — Progress Notes (Signed)
SATURATION QUALIFICATIONS: (This note is used to comply with regulatory documentation for home oxygen)  Patient Saturations on Room Air at Rest =  87%  Patient Saturations on Room Air while Ambulating   Patient Saturations on 1 Liters of oxygen while Ambulating = 87% 1L and  91 2L  Please briefly explain why patient needs home oxygen: Pt will need home Oxygen due to desaturation on room air while at rest and then when ambulating on 1 L, and requiring 2 L

## 2020-10-24 ENCOUNTER — Other Ambulatory Visit (HOSPITAL_COMMUNITY): Payer: Self-pay | Admitting: Internal Medicine

## 2020-10-24 LAB — CULTURE, BLOOD (ROUTINE X 2)
Culture: NO GROWTH
Culture: NO GROWTH
Special Requests: ADEQUATE
Special Requests: ADEQUATE

## 2020-10-24 LAB — GLUCOSE, CAPILLARY
Glucose-Capillary: 71 mg/dL (ref 70–99)
Glucose-Capillary: 93 mg/dL (ref 70–99)

## 2020-10-24 MED ORDER — DIGOXIN 125 MCG PO TABS: 0.0625 mg | ORAL_TABLET | Freq: Every day | ORAL | 0 refills | Status: DC

## 2020-10-24 MED ORDER — TRAMADOL HCL 50 MG PO TABS: 50.0000 mg | ORAL_TABLET | Freq: Two times a day (BID) | ORAL | 0 refills | Status: DC | PRN

## 2020-10-24 MED FILL — AMOX-CLAV 875-125 MG TABLET: 875-125 | 9 days supply | Qty: 18 | Fill #0

## 2020-10-24 MED FILL — DIGOXIN 0.125 MG TABLET: 125 | 90 days supply | Qty: 45 | Fill #0

## 2020-10-24 MED FILL — traMADol HCL 50 MG TABS: 50 | 7 days supply | Qty: 14 | Fill #0

## 2020-10-24 NOTE — Consult Note (Signed)
   [facility] CM Inpatient Consult   10/24/2020  Danielle Atkins [facility] 05-Aug-1935 [id number]   [facility] [ACO] Patient: [name] Medicare   Patient screened for high risk score for unplanned readmission and for less than 30 days readmission hospitalization.  Screened to check for potential [facility] service needs.  Review of patient's medical record reveals patient is per inpatient Transition of Care RNCM notes, moving to [place].  Plan:  Patient will be transitioning to another state and not to local community.  Of note, [facility] Care Management does not replace or interfere with arrangements set by the inpatient [facility] team.  For questions contact:   Natividad Brood, RN BSN [facility] Liaison  [phone] business mobile phone Toll free office [phone]  Fax number: [phone] [email or web address]

## 2020-10-24 NOTE — Discharge Instructions (Signed)
Gallbladder Eating Plan If you have a gallbladder condition, you may have trouble digesting fats. Eating a low-fat diet can help reduce your symptoms, and may be helpful before and after having surgery to remove your gallbladder (cholecystectomy). Your health care provider may recommend that you work with a diet and nutrition specialist (dietitian) to help you reduce the amount of fat in your diet. What are tips for following this plan? General guidelines  Limit your fat intake to less than 30% of your total daily calories. If you eat around 1,800 calories each day, this is less than 60 grams (g) of fat per day.  Fat is an important part of a healthy diet. Eating a low-fat diet can make it hard to maintain a healthy body weight. Ask your dietitian how much fat, calories, and other nutrients you need each day.  Eat small, frequent meals throughout the day instead of three large meals.  Drink at least 8-10 cups of fluid a day. Drink enough fluid to keep your urine clear or pale yellow.  Limit alcohol intake to no more than 1 drink a day for nonpregnant women and 2 drinks a day for men. One drink equals 12 oz of beer, 5 oz of wine, or 1 oz of hard liquor. Reading food labels  Check Nutrition Facts on food labels for the amount of fat per serving. Choose foods with less than 3 grams of fat per serving. Shopping  Choose nonfat and low-fat healthy foods. Look for the words "nonfat," "low fat," or "fat free."  Avoid buying processed or prepackaged foods. Cooking  Cook using low-fat methods, such as baking, broiling, grilling, or boiling.  Cook with small amounts of healthy fats, such as olive oil, grapeseed oil, canola oil, or sunflower oil. What foods are recommended?   All fresh, frozen, or canned fruits and vegetables.  Whole grains.  Low-fat or non-fat (skim) milk and yogurt.  Lean meat, skinless poultry, fish, eggs, and beans.  Low-fat protein supplement powders or  drinks.  Spices and herbs. What foods are not recommended?  High-fat foods. These include baked goods, fast food, fatty cuts of meat, ice cream, french toast, sweet rolls, pizza, cheese bread, foods covered with butter, creamy sauces, or cheese.  Fried foods. These include french fries, tempura, battered fish, breaded chicken, fried breads, and sweets.  Foods with strong odors.  Foods that cause bloating and gas. Summary  A low-fat diet can be helpful if you have a gallbladder condition, or before and after gallbladder surgery.  Limit your fat intake to less than 30% of your total daily calories. This is about 60 g of fat if you eat 1,800 calories each day.  Eat small, frequent meals throughout the day instead of three large meals. This information is not intended to replace advice given to you by your health care provider. Make sure you discuss any questions you have with your health care provider. Document Revised: 03/24/2019 Document Reviewed: 01/08/2017 Elsevier Patient Education  [place]. Cholangitis  Cholangitis is inflammation of the group of tubes (ducts) that carry digestive juices from the liver, gallbladder, and pancreas to the small intestine. This group of ducts is called the biliary tract. Cholangitis can cause fever, abdominal pain, and yellowish discoloration of the skin, the whites of the eyes, and mucous membranes (jaundice). Cholangitis can get worse very quickly and cause infection throughout the body (sepsis). It is important to diagnose and treat cholangitis as soon as possible. What are the causes? This  condition is usually caused by a blockage (obstruction) in the biliary tract. The most common causes of obstruction are:  Formation of hard particles (stones) in the biliary tract.  Damage to the biliary tract from a previous surgical or diagnostic procedure. Other causes of an obstruction include:  Cysts or tumors in the biliary tract.  A type of  liver disease that affects the biliary tract (primary sclerosing cholangitis).  Being born with a narrow biliary tract. When the flow of digestive juices is blocked, bacteria that normally live in the intestine can grow and spread inside the biliary tract. What increases the risk? The following factors may make you more likely to develop this condition:  Being 41?83 years old.  Having a history of stones in the biliary tract.  Having had cholangitis in the past.  Having HIV.  Having another condition that affects the biliary tract.  Having had a procedure to diagnose or treat problems with the biliary tract, especially endoscopic retrograde cholangiopancreatography (ERCP). These types of procedures may cause scarring and obstruction that can lead to infection. What are the signs or symptoms? The most common symptoms of this condition are fever, abdominal pain, and jaundice. Often, all of these symptoms are present. Other symptoms may include:  Chills.  Tiredness.  Nausea.  Dark-colored urine.  Clay-colored stools.  Confusion.  Itchy skin. How is this diagnosed? This condition may be diagnosed based on:  Your symptoms.  A physical exam.  Your medical history. Your health care provider may ask whether you have had stones, ERCP, or other procedures involving the biliary tract in the past.  Blood tests.  Imaging studies, such as: ? An ultrasound. This uses sound waves to make an image of any obstructions that you have. ? An MRI. ? A CT scan.  ERCP to check the biliary tract for possible causes of cholangitis. During ERCP, a thin, lighted tube (endoscope) is passed through your mouth and down your throat into the first part of your small intestine (duodenum). A small, plastic tube (cannula) is then passed through the endoscope and directed into your bile duct or pancreatic duct. Dye is then injected through the cannula and X-rays are taken. How is this treated? This  condition is usually treated at a hospital. Treatment may include:  Receiving fluids, nutrition, and antibiotic medicines through an IV line. You may be given antibiotics that kill most of the bacteria known to cause cholangitis (broad spectrumantibiotics).  ERCP or another surgical procedure to open and drain the biliary tract. Follow these instructions at home: Medicines  Take over-the-counter and prescription medicines only as told by your health care provider.  Take your antibiotic medicine as told by your health care provider. Do not stop taking the antibiotic even if you start to feel better. General instructions  Follow instructions from your health care provider about eating or drinking restrictions.  Maintain a healthy weight.  Keep all follow-up visits as told by your health care provider. This is important. Activity  Exercise regularly, as told by your health care provider.  Return to your normal activities as told by your health care provider. Ask your health care provider what activities are safe for you. Contact a health care provider if you:  Have symptoms that return or become more severe.  Suddenly lose weight. Get help right away if you:  Have a fever.  Have chills.  Have severe abdominal pain.  Feel dizzy or lightheaded. Summary  Cholangitis is inflammation of the group of  tubes (ducts) that carry digestive juices from the liver, gallbladder, and pancreas to the small intestine.  This condition is usually caused by a blockage (obstruction) in the biliary tract.  The most common symptoms of this condition are fever, abdominal pain, and jaundice.  This condition is usually treated at a hospital. This information is not intended to replace advice given to you by your health care provider. Make sure you discuss any questions you have with your health care provider. Document Revised: 08/05/2018 Document Reviewed: 08/05/2018 Elsevier Patient Education   2020 [employer].

## 2020-10-24 NOTE — Plan of Care (Signed)
  Problem: Education: Goal: Knowledge of General Education information will improve Description: Including pain rating scale, medication(s)/side effects and non-pharmacologic comfort measures Outcome: Adequate for Discharge   Problem: Health Behavior/Discharge Planning: Goal: Ability to manage health-related needs will improve Outcome: Adequate for Discharge   Problem: Clinical Measurements: Goal: Ability to maintain clinical measurements within normal limits will improve Outcome: Adequate for Discharge Goal: Will remain free from infection Outcome: Adequate for Discharge Goal: Diagnostic test results will improve Outcome: Adequate for Discharge Goal: Respiratory complications will improve Outcome: Adequate for Discharge Goal: Cardiovascular complication will be avoided Outcome: Adequate for Discharge   Problem: Activity: Goal: Risk for activity intolerance will decrease Outcome: Adequate for Discharge   Problem: Nutrition: Goal: Adequate nutrition will be maintained Outcome: Adequate for Discharge   Problem: Coping: Goal: Level of anxiety will decrease Outcome: Adequate for Discharge   Problem: Elimination: Goal: Will not experience complications related to bowel motility Outcome: Adequate for Discharge Goal: Will not experience complications related to urinary retention Outcome: Adequate for Discharge   Problem: Pain Managment: Goal: General experience of comfort will improve Outcome: Adequate for Discharge   Problem: Skin Integrity: Goal: Risk for impaired skin integrity will decrease Outcome: Adequate for Discharge

## 2020-10-25 ENCOUNTER — Encounter: Payer: Self-pay | Admitting: General Practice

## 2020-10-26 ENCOUNTER — Telehealth: Payer: Self-pay | Admitting: *Deleted

## 2020-10-26 ENCOUNTER — Telehealth: Payer: Self-pay | Admitting: Internal Medicine

## 2020-10-26 NOTE — Telephone Encounter (Signed)
Transition Care Management Unsuccessful Follow-up Telephone Call  Date of discharge and from where:  10/24/2020  Attempts:  1st Attempt  Reason for unsuccessful TCM follow-up call:  Left voice message

## 2020-10-26 NOTE — Telephone Encounter (Signed)
Patient's son, Reita Cliche, called requesting a referral for a cardiologist in [place]. Voice message left for Darryl to call back for further guidance.

## 2020-10-30 ENCOUNTER — Telehealth: Payer: Self-pay | Admitting: *Deleted

## 2020-10-30 NOTE — Telephone Encounter (Signed)
Spoke with pt's son, Danielle Atkins, regarding referral for a cardiologist for Danielle Atkins. Suggested any provider at [place] or [place]. Pt acknowledges. All questions answered.

## 2020-11-05 DIAGNOSIS — N1831 Chronic kidney disease, stage 3a: Secondary | ICD-10-CM | POA: Diagnosis not present

## 2020-11-05 DIAGNOSIS — I1310 Hypertensive heart and chronic kidney disease without heart failure, with stage 1 through stage 4 chronic kidney disease, or unspecified chronic kidney disease: Secondary | ICD-10-CM | POA: Diagnosis not present

## 2020-11-05 DIAGNOSIS — E1129 Type 2 diabetes mellitus with other diabetic kidney complication: Secondary | ICD-10-CM | POA: Diagnosis not present

## 2020-11-05 DIAGNOSIS — I7789 Other specified disorders of arteries and arterioles: Secondary | ICD-10-CM | POA: Diagnosis not present

## 2020-11-09 DIAGNOSIS — I50812 Chronic right heart failure: Secondary | ICD-10-CM | POA: Diagnosis not present

## 2020-11-09 DIAGNOSIS — D631 Anemia in chronic kidney disease: Secondary | ICD-10-CM | POA: Diagnosis not present

## 2020-11-09 DIAGNOSIS — I130 Hypertensive heart and chronic kidney disease with heart failure and stage 1 through stage 4 chronic kidney disease, or unspecified chronic kidney disease: Secondary | ICD-10-CM | POA: Diagnosis not present

## 2020-11-09 DIAGNOSIS — I5032 Chronic diastolic (congestive) heart failure: Secondary | ICD-10-CM | POA: Diagnosis not present

## 2020-11-09 DIAGNOSIS — J432 Centrilobular emphysema: Secondary | ICD-10-CM | POA: Diagnosis not present

## 2020-11-09 DIAGNOSIS — E1122 Type 2 diabetes mellitus with diabetic chronic kidney disease: Secondary | ICD-10-CM | POA: Diagnosis not present

## 2020-11-09 DIAGNOSIS — N183 Chronic kidney disease, stage 3 unspecified: Secondary | ICD-10-CM | POA: Diagnosis not present

## 2020-11-09 DIAGNOSIS — I712 Thoracic aortic aneurysm, without rupture: Secondary | ICD-10-CM | POA: Diagnosis not present

## 2020-11-09 DIAGNOSIS — E785 Hyperlipidemia, unspecified: Secondary | ICD-10-CM | POA: Diagnosis not present

## 2020-11-13 DIAGNOSIS — I712 Thoracic aortic aneurysm, without rupture: Secondary | ICD-10-CM | POA: Diagnosis not present

## 2020-11-13 DIAGNOSIS — N183 Chronic kidney disease, stage 3 unspecified: Secondary | ICD-10-CM | POA: Diagnosis not present

## 2020-11-13 DIAGNOSIS — D631 Anemia in chronic kidney disease: Secondary | ICD-10-CM | POA: Diagnosis not present

## 2020-11-13 DIAGNOSIS — E1122 Type 2 diabetes mellitus with diabetic chronic kidney disease: Secondary | ICD-10-CM | POA: Diagnosis not present

## 2020-11-13 DIAGNOSIS — J432 Centrilobular emphysema: Secondary | ICD-10-CM | POA: Diagnosis not present

## 2020-11-13 DIAGNOSIS — I50812 Chronic right heart failure: Secondary | ICD-10-CM | POA: Diagnosis not present

## 2020-11-13 DIAGNOSIS — E785 Hyperlipidemia, unspecified: Secondary | ICD-10-CM | POA: Diagnosis not present

## 2020-11-13 DIAGNOSIS — I130 Hypertensive heart and chronic kidney disease with heart failure and stage 1 through stage 4 chronic kidney disease, or unspecified chronic kidney disease: Secondary | ICD-10-CM | POA: Diagnosis not present

## 2020-11-13 DIAGNOSIS — I5032 Chronic diastolic (congestive) heart failure: Secondary | ICD-10-CM | POA: Diagnosis not present

## 2020-11-14 ENCOUNTER — Encounter: Payer: Medicare HMO | Admitting: Internal Medicine

## 2020-11-16 DIAGNOSIS — D631 Anemia in chronic kidney disease: Secondary | ICD-10-CM | POA: Diagnosis not present

## 2020-11-16 DIAGNOSIS — J432 Centrilobular emphysema: Secondary | ICD-10-CM | POA: Diagnosis not present

## 2020-11-16 DIAGNOSIS — I712 Thoracic aortic aneurysm, without rupture: Secondary | ICD-10-CM | POA: Diagnosis not present

## 2020-11-16 DIAGNOSIS — E785 Hyperlipidemia, unspecified: Secondary | ICD-10-CM | POA: Diagnosis not present

## 2020-11-16 DIAGNOSIS — I130 Hypertensive heart and chronic kidney disease with heart failure and stage 1 through stage 4 chronic kidney disease, or unspecified chronic kidney disease: Secondary | ICD-10-CM | POA: Diagnosis not present

## 2020-11-16 DIAGNOSIS — I50812 Chronic right heart failure: Secondary | ICD-10-CM | POA: Diagnosis not present

## 2020-11-16 DIAGNOSIS — E1122 Type 2 diabetes mellitus with diabetic chronic kidney disease: Secondary | ICD-10-CM | POA: Diagnosis not present

## 2020-11-16 DIAGNOSIS — N183 Chronic kidney disease, stage 3 unspecified: Secondary | ICD-10-CM | POA: Diagnosis not present

## 2020-11-16 DIAGNOSIS — I5032 Chronic diastolic (congestive) heart failure: Secondary | ICD-10-CM | POA: Diagnosis not present

## 2020-11-19 ENCOUNTER — Other Ambulatory Visit: Payer: Self-pay | Admitting: Internal Medicine

## 2020-11-19 DIAGNOSIS — E039 Hypothyroidism, unspecified: Secondary | ICD-10-CM

## 2020-11-20 DIAGNOSIS — I5032 Chronic diastolic (congestive) heart failure: Secondary | ICD-10-CM | POA: Diagnosis not present

## 2020-11-20 DIAGNOSIS — N183 Chronic kidney disease, stage 3 unspecified: Secondary | ICD-10-CM | POA: Diagnosis not present

## 2020-11-20 DIAGNOSIS — R0902 Hypoxemia: Secondary | ICD-10-CM | POA: Diagnosis not present

## 2020-11-20 DIAGNOSIS — R0602 Shortness of breath: Secondary | ICD-10-CM | POA: Diagnosis not present

## 2020-11-20 DIAGNOSIS — D631 Anemia in chronic kidney disease: Secondary | ICD-10-CM | POA: Diagnosis not present

## 2020-11-20 DIAGNOSIS — I712 Thoracic aortic aneurysm, without rupture: Secondary | ICD-10-CM | POA: Diagnosis not present

## 2020-11-20 DIAGNOSIS — Z9981 Dependence on supplemental oxygen: Secondary | ICD-10-CM | POA: Diagnosis not present

## 2020-11-20 DIAGNOSIS — E1122 Type 2 diabetes mellitus with diabetic chronic kidney disease: Secondary | ICD-10-CM | POA: Diagnosis not present

## 2020-11-20 DIAGNOSIS — J432 Centrilobular emphysema: Secondary | ICD-10-CM | POA: Diagnosis not present

## 2020-11-20 DIAGNOSIS — I50812 Chronic right heart failure: Secondary | ICD-10-CM | POA: Diagnosis not present

## 2020-11-20 DIAGNOSIS — Z6824 Body mass index (BMI) 24.0-24.9, adult: Secondary | ICD-10-CM | POA: Diagnosis not present

## 2020-11-20 DIAGNOSIS — I517 Cardiomegaly: Secondary | ICD-10-CM | POA: Diagnosis not present

## 2020-11-20 DIAGNOSIS — I130 Hypertensive heart and chronic kidney disease with heart failure and stage 1 through stage 4 chronic kidney disease, or unspecified chronic kidney disease: Secondary | ICD-10-CM | POA: Diagnosis not present

## 2020-11-20 DIAGNOSIS — J984 Other disorders of lung: Secondary | ICD-10-CM | POA: Diagnosis not present

## 2020-11-20 DIAGNOSIS — J810 Acute pulmonary edema: Secondary | ICD-10-CM | POA: Diagnosis not present

## 2020-11-20 DIAGNOSIS — E785 Hyperlipidemia, unspecified: Secondary | ICD-10-CM | POA: Diagnosis not present

## 2020-11-22 DIAGNOSIS — R531 Weakness: Secondary | ICD-10-CM | POA: Diagnosis not present

## 2020-11-23 DIAGNOSIS — E785 Hyperlipidemia, unspecified: Secondary | ICD-10-CM | POA: Diagnosis not present

## 2020-11-23 DIAGNOSIS — I5032 Chronic diastolic (congestive) heart failure: Secondary | ICD-10-CM | POA: Diagnosis not present

## 2020-11-23 DIAGNOSIS — I130 Hypertensive heart and chronic kidney disease with heart failure and stage 1 through stage 4 chronic kidney disease, or unspecified chronic kidney disease: Secondary | ICD-10-CM | POA: Diagnosis not present

## 2020-11-23 DIAGNOSIS — D631 Anemia in chronic kidney disease: Secondary | ICD-10-CM | POA: Diagnosis not present

## 2020-11-23 DIAGNOSIS — N183 Chronic kidney disease, stage 3 unspecified: Secondary | ICD-10-CM | POA: Diagnosis not present

## 2020-11-23 DIAGNOSIS — I712 Thoracic aortic aneurysm, without rupture: Secondary | ICD-10-CM | POA: Diagnosis not present

## 2020-11-23 DIAGNOSIS — I50812 Chronic right heart failure: Secondary | ICD-10-CM | POA: Diagnosis not present

## 2020-11-23 DIAGNOSIS — J432 Centrilobular emphysema: Secondary | ICD-10-CM | POA: Diagnosis not present

## 2020-11-23 DIAGNOSIS — E1122 Type 2 diabetes mellitus with diabetic chronic kidney disease: Secondary | ICD-10-CM | POA: Diagnosis not present

## 2020-11-26 ENCOUNTER — Telehealth: Payer: Self-pay | Admitting: Pharmacist

## 2020-11-26 DIAGNOSIS — E1122 Type 2 diabetes mellitus with diabetic chronic kidney disease: Secondary | ICD-10-CM | POA: Diagnosis not present

## 2020-11-26 DIAGNOSIS — I130 Hypertensive heart and chronic kidney disease with heart failure and stage 1 through stage 4 chronic kidney disease, or unspecified chronic kidney disease: Secondary | ICD-10-CM | POA: Diagnosis not present

## 2020-11-26 DIAGNOSIS — N183 Chronic kidney disease, stage 3 unspecified: Secondary | ICD-10-CM | POA: Diagnosis not present

## 2020-11-26 DIAGNOSIS — D631 Anemia in chronic kidney disease: Secondary | ICD-10-CM | POA: Diagnosis not present

## 2020-11-26 DIAGNOSIS — I712 Thoracic aortic aneurysm, without rupture: Secondary | ICD-10-CM | POA: Diagnosis not present

## 2020-11-26 DIAGNOSIS — J432 Centrilobular emphysema: Secondary | ICD-10-CM | POA: Diagnosis not present

## 2020-11-26 DIAGNOSIS — E785 Hyperlipidemia, unspecified: Secondary | ICD-10-CM | POA: Diagnosis not present

## 2020-11-26 DIAGNOSIS — I50812 Chronic right heart failure: Secondary | ICD-10-CM | POA: Diagnosis not present

## 2020-11-26 DIAGNOSIS — I5032 Chronic diastolic (congestive) heart failure: Secondary | ICD-10-CM | POA: Diagnosis not present

## 2020-11-26 NOTE — Chronic Care Management (AMB) (Signed)
I left the patient a message about her upcoming appointment on 11/27/2020 @ 2:00 pm with the clinical pharmacist. She was asked to please have all medication on hand to review the pharmacist.  Maia Breslow, [place] Assistant [phone]

## 2020-11-27 ENCOUNTER — Telehealth: Payer: Medicare HMO

## 2020-11-27 DIAGNOSIS — E785 Hyperlipidemia, unspecified: Secondary | ICD-10-CM | POA: Diagnosis not present

## 2020-11-27 DIAGNOSIS — I712 Thoracic aortic aneurysm, without rupture: Secondary | ICD-10-CM | POA: Diagnosis not present

## 2020-11-27 DIAGNOSIS — E1122 Type 2 diabetes mellitus with diabetic chronic kidney disease: Secondary | ICD-10-CM | POA: Diagnosis not present

## 2020-11-27 DIAGNOSIS — D631 Anemia in chronic kidney disease: Secondary | ICD-10-CM | POA: Diagnosis not present

## 2020-11-27 DIAGNOSIS — I50812 Chronic right heart failure: Secondary | ICD-10-CM | POA: Diagnosis not present

## 2020-11-27 DIAGNOSIS — I5032 Chronic diastolic (congestive) heart failure: Secondary | ICD-10-CM | POA: Diagnosis not present

## 2020-11-27 DIAGNOSIS — I130 Hypertensive heart and chronic kidney disease with heart failure and stage 1 through stage 4 chronic kidney disease, or unspecified chronic kidney disease: Secondary | ICD-10-CM | POA: Diagnosis not present

## 2020-11-27 DIAGNOSIS — N183 Chronic kidney disease, stage 3 unspecified: Secondary | ICD-10-CM | POA: Diagnosis not present

## 2020-11-27 DIAGNOSIS — J432 Centrilobular emphysema: Secondary | ICD-10-CM | POA: Diagnosis not present

## 2020-11-27 NOTE — Chronic Care Management (AMB) (Deleted)
Chronic Care Management Pharmacy  Name: Danielle Atkins  MRN: [id number] DOB: 12-Jul-1935  Initial Questions: 1. Have you seen any other providers since your last visit? NA 2. Any changes in your medicines or health? No   Chief Complaint/ HPI  [address] [place],  85 y.o. , female presents for their Initial CCM visit with the clinical pharmacist via telephone due to COVID-19 Pandemic.  Patient endorses feeling well. She states she is careful when going out and has received both COVID vaccines. She mentions she used to travel with daughters to several countries and is not sure when she will get to doing it again.   PCP : [name], [name], MD  Their chronic conditions include: HTN, DM, HLD, Hx of TIA, Gout, Hypothyroidism, PUD, Piriformis syndrome, Low bone density, CKD stage 3  Office Visits: 12/01/2019- Lelon Frohlich, MD- Patient presented for office visit for 3 month follow up. No major interventions done. Patient is well controlled. Patient to obtain repeat TSH. Patient to follow up in 3 months.   Consult Visit: 02/17/2020- Nephrology- Edrick Oh- Patient presented for office visit. Unable to access notes.   09/28/2020- Sports medicine- Karlton Lemon, MD- Patient presented for office visit for left leg pain. Presentation consistent with piriformis syndrome. Patient to proceed with PT, APAP prn for pain. Piriformis stretching information given to patient. Also advised to use OTC voltaren gel or capsaicin cream for pain. Follow up in 6 to 8 weeks if no improvement.   Medications: Outpatient Encounter Medications as of 11/27/2020  Medication Sig  . acetaminophen (TYLENOL) 325 MG tablet Take 2 tablets (650 mg total) by mouth every 6 (six) hours as needed for mild pain (or Fever >/= 101).  Marland Kitchen albuterol (PROAIR HFA) 108 (90 Base) MCG/ACT inhaler Inhale 2 puffs into the lungs every 6 (six) hours as needed for wheezing.  Marland Kitchen allopurinol (ZYLOPRIM) 300 MG tablet Take 1  tablet (300 mg total) by mouth daily.  . Ascorbic Acid (VITAMIN C) 100 MG tablet Take 10 tablets (1,000 mg total) by mouth daily. Takes C Complex  . ascorbic acid (VITAMIN C) 500 MG tablet Take 1,000 mg by mouth in the morning.  Marland Kitchen aspirin EC 325 MG EC tablet Take 1 tablet (325 mg total) by mouth daily.  . bisoprolol (ZEBETA) 5 MG tablet Take 1 tablet (5 mg total) by mouth daily.  . Cholecalciferol (VITAMIN D3) 125 MCG (5000 UT) TABS Take 5,000 Units by mouth daily.  . cholecalciferol (VITAMIN D3) 25 MCG (1000 UNIT) tablet Take 5 tablets (5,000 Units total) by mouth daily.  . digoxin (LANOXIN) 0.125 MG tablet Take 0.5 tablets (0.0625 mg total) by mouth daily.  . famotidine (PEPCID) 40 MG tablet Take 1 tablet (40 mg total) by mouth daily.  . feeding supplement, GLUCERNA SHAKE, (GLUCERNA SHAKE) LIQD Take 237 mLs by mouth 2 (two) times daily between meals.  . ferrous sulfate 325 (65 FE) MG tablet Take 1 tablet (325 mg total) by mouth 2 (two) times daily with a meal.  . latanoprost (XALATAN) 0.005 % ophthalmic solution Place 1 drop into both eyes at bedtime.   Marland Kitchen levothyroxine (SYNTHROID) 25 MCG tablet Take 1 tablet (25 mcg total) by mouth in the morning.  Marland Kitchen levothyroxine (SYNTHROID) 50 MCG tablet TAKE 1/2 TABLET EVERY DAY BEFORE BREAKFAST (NEED MD APPOINTMENT)  . metFORMIN (GLUCOPHAGE) 500 MG tablet Take 1 tablet (500 mg total) by mouth daily with breakfast.  . potassium chloride SA (KLOR-CON) 20 MEQ tablet Take 2  tablets (40 mEq total) by mouth daily.  . simvastatin (ZOCOR) 40 MG tablet Take 1 tablet (40 mg total) by mouth daily. (Patient taking differently: Take 40 mg by mouth at bedtime. )  . traMADol (ULTRAM) 50 MG tablet Take 1 tablet (50 mg total) by mouth every 12 (twelve) hours as needed.   No facility-administered encounter medications on file as of 11/27/2020.     Current Diagnosis/Assessment:  Goals Addressed   None     Diabetes   Recent Relevant Labs: Lab Results  Component  Value Date/Time   HGBA1C 5.8 (H) 09/14/2020 03:50 AM   HGBA1C 5.5 07/20/2020 09:40 AM   HGBA1C 5.5 12/01/2019 07:07 AM   HGBA1C 6.5 09/01/2019 07:38 AM   MICROALBUR 5.9 (H) 10/01/2016 08:09 AM   MICROALBUR 5.1 (H) 04/08/2016 08:51 AM    Patient has failed these meds in past: none   Patient is currently controlled on the following medications:   Metformin 500 mg, 1 tablet daily with breakfast   Last diabetic Eye exam:  Lab Results  Component Value Date/Time   HMDIABEYEEXA No Retinopathy 06/28/2020 12:00 AM    Last diabetic Foot exam: No results found for: HMDIABFOOTEX   Plan Continue current medications   Hypertension   Office blood pressures are  BP Readings from Last 3 Encounters:  10/24/20 111/66  10/18/20 (!) 142/74  10/10/20 114/86   Patient has failed these meds in the past: lisinopril, losartan   Patient checks BP at home daily  Patient home BP readings are ranging: unable to provide exact readings (reports doing "well")   Patient is controlled on:   Amlodipine $RemoveBef'5mg'rArlyWGmjx$ , 1 tablet once daily   We discussed - exercising: patient reports she has started walking again at the park with a friend (she mentions doing the silver sneaker program at [facility], but has stopped due to covid).   Plan Continue current medications   Hyperlipidemia   LDL goal < 70  Lipid Panel     Component Value Date/Time   CHOL 86 09/19/2020 0427   TRIG 96 09/19/2020 0427   HDL 23 (L) 09/19/2020 0427   LDLCALC 44 09/19/2020 0427    Hepatic Function Latest Ref Rng & Units 10/23/2020 10/22/2020 10/21/2020  Total Protein 6.5 - 8.1 g/dL 6.4(L) 6.4(L) 6.6  Albumin 3.5 - 5.0 g/dL 1.6(L) 1.8(L) 1.8(L)  AST 15 - 41 U/L 41 53(H) 64(H)  ALT 0 - 44 U/L 35 39 48(H)  Alk Phosphatase 38 - 126 U/L 65 63 72  Total Bilirubin 0.3 - 1.2 mg/dL 0.9 0.9 0.9  Bilirubin, Direct 0.0 - 0.2 mg/dL - - -     The ASCVD Risk score Mikey Bussing DC Jr., et al., 2013) failed to calculate for the following reasons:   The 2013  ASCVD risk score is only valid for ages 35 to 11   Patient has failed these meds in past: none   Patient is currently controlled on the following medications:  . Simvastatin $RemoveBefor'40mg'tXqjUbMSncfa$ , 1 tablet once daily   We discussed:  diet and exercise extensively (see above).   Plan Continue current medications  History of TIA   Patient is currently controlled on the following medications:  . Aspirin $RemoveB'81mg'LtpIqzwW$ , 1 tablet once daily   Plan Continue current medications  Hypothyroidism  Patient reported being confused since she previously received a prescription with directions to take levothyroxine 24mcg, 1 full tablet and mentions never been told to increase so she has continued on 0.5 tablet daily. Since then, TSH has  been rechecked on 11/2019 and TSH is WNL.   Lab Results  Component Value Date/Time   TSH 4.607 (H) 09/19/2020 04:27 AM   TSH 2.77 12/01/2019 08:03 AM   TSH 4.69 (H) 09/01/2019 07:38 AM   TSH 5.47 (H) 02/24/2018 09:25 AM   Patient has failed these meds in past: none   Patient is currently controlled on the following medications:  . Levothyroxine 24mg, 0.5 (one half) every day before breakfast   We discussed:   proper administration of levothyroxine.   we reviewed importance of maintaining administration consistent everyday and dosing separate from other vitamins    Plan Continue current medications  Gout   Uric acid: 4.1 (07/12/2013)   Patient was on these meds in past: colchicine   Patient is currently controlled on the following medications:   Allopurinol 3032m 1 tablet once daily   Plan Continue current medications Consider dose decrease to 10069mnce daily   Piriformis syndrome    Patient is currently controlled on the following medications:  . Diclofenac 71m47m tablet three times daily as needed for pain (currently not taking)  Plan Continue current medications   Peptic ulcer disease   Patient reports watching what she eats (avoiding trigger foods).    Patient is currently controlled on the following medications:  . Famotidine 40mg72mtablet once daily   Plan Continue current medications  Shortness of breath (per patient report)   No diagnosis found for asthma, COPD, or other respiratory condition.   Patient notes she will use inhaler more often when it is hot and humid which exacerbates wheezing. But states overall she is doing well in regards to breathing.   Patient is currently controlled on the following medications:  . Albuterol HFA inhaler, 2 puffs every six hours as needed for wheezing   Plan Continue current medications  Bone health- low bone density/ osteopenia    Last DEXA Scan: 09/01/2019   T-Score femoral neck: RFN: -2.2; LFN: -2.0  T-Score lumbar spine: -1.8   10-year probability of major osteoporotic fracture: 7.6  10-year probability of hip fracture: 2.3%  VITD  Date Value Ref Range Status  09/01/2019 40.76 30.00 - 100.00 ng/mL Final    Patient is not a candidate for pharmacologic treatment  Patient is currently controlled on the following medications:   Vitamin D3 (cholecalciferol) 1000 units, 1 tablet once daily (at night)   Calcium citrate, vitamin D3, magnesium 1000mg,20mg, 52mg, 141mlets twice daily  Plan Continue current medications   OTC/ supplements    Patient is currently on the following medications:   Ascorbic acid 100mg, 1 64met once daily (at night)  . Multivitamin, 1 tablet once daily (at night)   Plan Continue current medications   CKD, Stage 3   Patient reports drinking "lots" of water everyday.   Kidney Function Lab Results  Component Value Date/Time   CREATININE 1.59 (H) 10/23/2020 12:23 AM   CREATININE 1.64 (H) 10/22/2020 08:21 AM   GFR 48.24 (L) 09/01/2019 07:38 AM   GFRNONAA 32 (L) 10/23/2020 12:23 AM   GFRAA 44 (L) 09/18/2020 04:05 AM   K 3.1 (L) 10/23/2020 12:23 AM   K 3.4 (L) 10/22/2020 08:21 AM  Kidney function stable.   Plan Continue to monitor and  adjust medications as needed.   Vaccines   Reviewed and discussed patient's vaccination history.    Immunization History  Administered Date(s) Administered  . Fluad Quad(high Dose 65+) 09/01/2019  . Influenza Split 09/01/2011  . Influenza Whole  09/27/2007, 09/25/2008, 09/14/2009  . Influenza, High Dose Seasonal PF 10/12/2013, 09/06/2014, 10/02/2015, 10/08/2016, 08/25/2017, 08/30/2018  . PFIZER SARS-COV-2 Vaccination 12/16/2019, 01/16/2020  . Pneumococcal Conjugate-13 10/03/2014  . Pneumococcal Polysaccharide-23 09/25/2008, 09/18/2020  . Td 08/16/2009  . Zoster 07/12/2013   Shingrix? covid booster?  Plan  Recommended patient receive *** vaccine in *** office.     Medication Management   Patient's [facility] pharmacy [facility]  [id number] [place] [place]Phone: [phone]: 3[place]Phone: [phone]: 3[facility] [place]Phone: [phone]: [phone] Uses pill box? {Yes or If no, why not?:20788} Pt endorses ***% compliance  We discussed: {Pharmacy options:24294}  Plan  {US Pharmacy PFMBB:40370}   Follow up: *** month phone visit  MJeni Salles PharmD BGrapevillePharmacist L[place]inaat [phone]

## 2020-11-29 DIAGNOSIS — E782 Mixed hyperlipidemia: Secondary | ICD-10-CM | POA: Diagnosis not present

## 2020-11-29 DIAGNOSIS — J984 Other disorders of lung: Secondary | ICD-10-CM | POA: Diagnosis not present

## 2020-11-29 DIAGNOSIS — Z8679 Personal history of other diseases of the circulatory system: Secondary | ICD-10-CM | POA: Diagnosis not present

## 2020-11-29 DIAGNOSIS — E119 Type 2 diabetes mellitus without complications: Secondary | ICD-10-CM | POA: Diagnosis not present

## 2020-11-29 DIAGNOSIS — I351 Nonrheumatic aortic (valve) insufficiency: Secondary | ICD-10-CM | POA: Diagnosis not present

## 2020-11-29 DIAGNOSIS — I509 Heart failure, unspecified: Secondary | ICD-10-CM | POA: Diagnosis not present

## 2020-11-29 DIAGNOSIS — N183 Chronic kidney disease, stage 3 unspecified: Secondary | ICD-10-CM | POA: Diagnosis not present

## 2020-12-04 DIAGNOSIS — N183 Chronic kidney disease, stage 3 unspecified: Secondary | ICD-10-CM | POA: Diagnosis not present

## 2020-12-04 DIAGNOSIS — J432 Centrilobular emphysema: Secondary | ICD-10-CM | POA: Diagnosis not present

## 2020-12-04 DIAGNOSIS — E1122 Type 2 diabetes mellitus with diabetic chronic kidney disease: Secondary | ICD-10-CM | POA: Diagnosis not present

## 2020-12-04 DIAGNOSIS — I712 Thoracic aortic aneurysm, without rupture: Secondary | ICD-10-CM | POA: Diagnosis not present

## 2020-12-04 DIAGNOSIS — E785 Hyperlipidemia, unspecified: Secondary | ICD-10-CM | POA: Diagnosis not present

## 2020-12-04 DIAGNOSIS — D631 Anemia in chronic kidney disease: Secondary | ICD-10-CM | POA: Diagnosis not present

## 2020-12-04 DIAGNOSIS — I5032 Chronic diastolic (congestive) heart failure: Secondary | ICD-10-CM | POA: Diagnosis not present

## 2020-12-04 DIAGNOSIS — I50812 Chronic right heart failure: Secondary | ICD-10-CM | POA: Diagnosis not present

## 2020-12-04 DIAGNOSIS — I130 Hypertensive heart and chronic kidney disease with heart failure and stage 1 through stage 4 chronic kidney disease, or unspecified chronic kidney disease: Secondary | ICD-10-CM | POA: Diagnosis not present

## 2020-12-09 DIAGNOSIS — I5032 Chronic diastolic (congestive) heart failure: Secondary | ICD-10-CM | POA: Diagnosis not present

## 2020-12-09 DIAGNOSIS — I50812 Chronic right heart failure: Secondary | ICD-10-CM | POA: Diagnosis not present

## 2020-12-09 DIAGNOSIS — D631 Anemia in chronic kidney disease: Secondary | ICD-10-CM | POA: Diagnosis not present

## 2020-12-09 DIAGNOSIS — I130 Hypertensive heart and chronic kidney disease with heart failure and stage 1 through stage 4 chronic kidney disease, or unspecified chronic kidney disease: Secondary | ICD-10-CM | POA: Diagnosis not present

## 2020-12-09 DIAGNOSIS — N183 Chronic kidney disease, stage 3 unspecified: Secondary | ICD-10-CM | POA: Diagnosis not present

## 2020-12-09 DIAGNOSIS — E1122 Type 2 diabetes mellitus with diabetic chronic kidney disease: Secondary | ICD-10-CM | POA: Diagnosis not present

## 2020-12-09 DIAGNOSIS — I712 Thoracic aortic aneurysm, without rupture: Secondary | ICD-10-CM | POA: Diagnosis not present

## 2020-12-09 DIAGNOSIS — J432 Centrilobular emphysema: Secondary | ICD-10-CM | POA: Diagnosis not present

## 2020-12-09 DIAGNOSIS — E785 Hyperlipidemia, unspecified: Secondary | ICD-10-CM | POA: Diagnosis not present

## 2020-12-10 DIAGNOSIS — E1122 Type 2 diabetes mellitus with diabetic chronic kidney disease: Secondary | ICD-10-CM | POA: Diagnosis not present

## 2020-12-10 DIAGNOSIS — I712 Thoracic aortic aneurysm, without rupture: Secondary | ICD-10-CM | POA: Diagnosis not present

## 2020-12-10 DIAGNOSIS — I50812 Chronic right heart failure: Secondary | ICD-10-CM | POA: Diagnosis not present

## 2020-12-10 DIAGNOSIS — N183 Chronic kidney disease, stage 3 unspecified: Secondary | ICD-10-CM | POA: Diagnosis not present

## 2020-12-10 DIAGNOSIS — E785 Hyperlipidemia, unspecified: Secondary | ICD-10-CM | POA: Diagnosis not present

## 2020-12-10 DIAGNOSIS — I5032 Chronic diastolic (congestive) heart failure: Secondary | ICD-10-CM | POA: Diagnosis not present

## 2020-12-10 DIAGNOSIS — J432 Centrilobular emphysema: Secondary | ICD-10-CM | POA: Diagnosis not present

## 2020-12-10 DIAGNOSIS — D631 Anemia in chronic kidney disease: Secondary | ICD-10-CM | POA: Diagnosis not present

## 2020-12-10 DIAGNOSIS — I130 Hypertensive heart and chronic kidney disease with heart failure and stage 1 through stage 4 chronic kidney disease, or unspecified chronic kidney disease: Secondary | ICD-10-CM | POA: Diagnosis not present

## 2020-12-12 DIAGNOSIS — I50812 Chronic right heart failure: Secondary | ICD-10-CM | POA: Diagnosis not present

## 2020-12-12 DIAGNOSIS — J432 Centrilobular emphysema: Secondary | ICD-10-CM | POA: Diagnosis not present

## 2020-12-12 DIAGNOSIS — I712 Thoracic aortic aneurysm, without rupture: Secondary | ICD-10-CM | POA: Diagnosis not present

## 2020-12-12 DIAGNOSIS — D631 Anemia in chronic kidney disease: Secondary | ICD-10-CM | POA: Diagnosis not present

## 2020-12-12 DIAGNOSIS — I5032 Chronic diastolic (congestive) heart failure: Secondary | ICD-10-CM | POA: Diagnosis not present

## 2020-12-12 DIAGNOSIS — I130 Hypertensive heart and chronic kidney disease with heart failure and stage 1 through stage 4 chronic kidney disease, or unspecified chronic kidney disease: Secondary | ICD-10-CM | POA: Diagnosis not present

## 2020-12-12 DIAGNOSIS — E785 Hyperlipidemia, unspecified: Secondary | ICD-10-CM | POA: Diagnosis not present

## 2020-12-12 DIAGNOSIS — E1122 Type 2 diabetes mellitus with diabetic chronic kidney disease: Secondary | ICD-10-CM | POA: Diagnosis not present

## 2020-12-12 DIAGNOSIS — N183 Chronic kidney disease, stage 3 unspecified: Secondary | ICD-10-CM | POA: Diagnosis not present

## 2020-12-17 DIAGNOSIS — I712 Thoracic aortic aneurysm, without rupture: Secondary | ICD-10-CM | POA: Diagnosis not present

## 2020-12-17 DIAGNOSIS — N183 Chronic kidney disease, stage 3 unspecified: Secondary | ICD-10-CM | POA: Diagnosis not present

## 2020-12-17 DIAGNOSIS — I5032 Chronic diastolic (congestive) heart failure: Secondary | ICD-10-CM | POA: Diagnosis not present

## 2020-12-17 DIAGNOSIS — I130 Hypertensive heart and chronic kidney disease with heart failure and stage 1 through stage 4 chronic kidney disease, or unspecified chronic kidney disease: Secondary | ICD-10-CM | POA: Diagnosis not present

## 2020-12-17 DIAGNOSIS — E1122 Type 2 diabetes mellitus with diabetic chronic kidney disease: Secondary | ICD-10-CM | POA: Diagnosis not present

## 2020-12-17 DIAGNOSIS — I50812 Chronic right heart failure: Secondary | ICD-10-CM | POA: Diagnosis not present

## 2020-12-17 DIAGNOSIS — J432 Centrilobular emphysema: Secondary | ICD-10-CM | POA: Diagnosis not present

## 2020-12-17 DIAGNOSIS — D631 Anemia in chronic kidney disease: Secondary | ICD-10-CM | POA: Diagnosis not present

## 2020-12-17 DIAGNOSIS — E785 Hyperlipidemia, unspecified: Secondary | ICD-10-CM | POA: Diagnosis not present

## 2020-12-18 DIAGNOSIS — D631 Anemia in chronic kidney disease: Secondary | ICD-10-CM | POA: Diagnosis not present

## 2020-12-18 DIAGNOSIS — I712 Thoracic aortic aneurysm, without rupture: Secondary | ICD-10-CM | POA: Diagnosis not present

## 2020-12-18 DIAGNOSIS — J432 Centrilobular emphysema: Secondary | ICD-10-CM | POA: Diagnosis not present

## 2020-12-18 DIAGNOSIS — E1122 Type 2 diabetes mellitus with diabetic chronic kidney disease: Secondary | ICD-10-CM | POA: Diagnosis not present

## 2020-12-18 DIAGNOSIS — I50812 Chronic right heart failure: Secondary | ICD-10-CM | POA: Diagnosis not present

## 2020-12-18 DIAGNOSIS — N183 Chronic kidney disease, stage 3 unspecified: Secondary | ICD-10-CM | POA: Diagnosis not present

## 2020-12-18 DIAGNOSIS — I5032 Chronic diastolic (congestive) heart failure: Secondary | ICD-10-CM | POA: Diagnosis not present

## 2020-12-18 DIAGNOSIS — I130 Hypertensive heart and chronic kidney disease with heart failure and stage 1 through stage 4 chronic kidney disease, or unspecified chronic kidney disease: Secondary | ICD-10-CM | POA: Diagnosis not present

## 2020-12-18 DIAGNOSIS — E785 Hyperlipidemia, unspecified: Secondary | ICD-10-CM | POA: Diagnosis not present

## 2020-12-24 DIAGNOSIS — E785 Hyperlipidemia, unspecified: Secondary | ICD-10-CM | POA: Diagnosis not present

## 2020-12-24 DIAGNOSIS — I50812 Chronic right heart failure: Secondary | ICD-10-CM | POA: Diagnosis not present

## 2020-12-24 DIAGNOSIS — I712 Thoracic aortic aneurysm, without rupture: Secondary | ICD-10-CM | POA: Diagnosis not present

## 2020-12-24 DIAGNOSIS — D631 Anemia in chronic kidney disease: Secondary | ICD-10-CM | POA: Diagnosis not present

## 2020-12-24 DIAGNOSIS — E1122 Type 2 diabetes mellitus with diabetic chronic kidney disease: Secondary | ICD-10-CM | POA: Diagnosis not present

## 2020-12-24 DIAGNOSIS — N183 Chronic kidney disease, stage 3 unspecified: Secondary | ICD-10-CM | POA: Diagnosis not present

## 2020-12-24 DIAGNOSIS — I5032 Chronic diastolic (congestive) heart failure: Secondary | ICD-10-CM | POA: Diagnosis not present

## 2020-12-24 DIAGNOSIS — J432 Centrilobular emphysema: Secondary | ICD-10-CM | POA: Diagnosis not present

## 2020-12-24 DIAGNOSIS — I130 Hypertensive heart and chronic kidney disease with heart failure and stage 1 through stage 4 chronic kidney disease, or unspecified chronic kidney disease: Secondary | ICD-10-CM | POA: Diagnosis not present

## 2021-01-03 DIAGNOSIS — N183 Chronic kidney disease, stage 3 unspecified: Secondary | ICD-10-CM | POA: Diagnosis not present

## 2021-01-03 DIAGNOSIS — J432 Centrilobular emphysema: Secondary | ICD-10-CM | POA: Diagnosis not present

## 2021-01-03 DIAGNOSIS — I50812 Chronic right heart failure: Secondary | ICD-10-CM | POA: Diagnosis not present

## 2021-01-03 DIAGNOSIS — I5032 Chronic diastolic (congestive) heart failure: Secondary | ICD-10-CM | POA: Diagnosis not present

## 2021-01-03 DIAGNOSIS — E1122 Type 2 diabetes mellitus with diabetic chronic kidney disease: Secondary | ICD-10-CM | POA: Diagnosis not present

## 2021-01-03 DIAGNOSIS — D631 Anemia in chronic kidney disease: Secondary | ICD-10-CM | POA: Diagnosis not present

## 2021-01-03 DIAGNOSIS — I712 Thoracic aortic aneurysm, without rupture: Secondary | ICD-10-CM | POA: Diagnosis not present

## 2021-01-03 DIAGNOSIS — E785 Hyperlipidemia, unspecified: Secondary | ICD-10-CM | POA: Diagnosis not present

## 2021-01-03 DIAGNOSIS — I130 Hypertensive heart and chronic kidney disease with heart failure and stage 1 through stage 4 chronic kidney disease, or unspecified chronic kidney disease: Secondary | ICD-10-CM | POA: Diagnosis not present

## 2021-01-04 DIAGNOSIS — J432 Centrilobular emphysema: Secondary | ICD-10-CM | POA: Diagnosis not present

## 2021-01-04 DIAGNOSIS — I130 Hypertensive heart and chronic kidney disease with heart failure and stage 1 through stage 4 chronic kidney disease, or unspecified chronic kidney disease: Secondary | ICD-10-CM | POA: Diagnosis not present

## 2021-01-04 DIAGNOSIS — E785 Hyperlipidemia, unspecified: Secondary | ICD-10-CM | POA: Diagnosis not present

## 2021-01-04 DIAGNOSIS — I5032 Chronic diastolic (congestive) heart failure: Secondary | ICD-10-CM | POA: Diagnosis not present

## 2021-01-04 DIAGNOSIS — E1122 Type 2 diabetes mellitus with diabetic chronic kidney disease: Secondary | ICD-10-CM | POA: Diagnosis not present

## 2021-01-04 DIAGNOSIS — I50812 Chronic right heart failure: Secondary | ICD-10-CM | POA: Diagnosis not present

## 2021-01-04 DIAGNOSIS — I712 Thoracic aortic aneurysm, without rupture: Secondary | ICD-10-CM | POA: Diagnosis not present

## 2021-01-04 DIAGNOSIS — D631 Anemia in chronic kidney disease: Secondary | ICD-10-CM | POA: Diagnosis not present

## 2021-01-04 DIAGNOSIS — N183 Chronic kidney disease, stage 3 unspecified: Secondary | ICD-10-CM | POA: Diagnosis not present

## 2021-02-27 ENCOUNTER — Other Ambulatory Visit: Payer: Self-pay | Admitting: Internal Medicine

## 2021-02-27 DIAGNOSIS — E039 Hypothyroidism, unspecified: Secondary | ICD-10-CM

## 2021-03-11 ENCOUNTER — Telehealth: Payer: Self-pay | Admitting: Internal Medicine

## 2021-03-11 NOTE — Telephone Encounter (Signed)
Sarah from [facility] call and stated she want to know what strength of Levothroxine should pt be on . Sarah's  # is [phone] and she want a call back.

## 2021-03-12 NOTE — Addendum Note (Signed)
Addended by: Westley Hummer B on: 03/12/2021 04:25 PM   Modules accepted: Orders

## 2021-03-12 NOTE — Telephone Encounter (Signed)
Spoke with pharmacist and levothyroxine 25 mcg will be sent.  Medication list updated.

## 2021-05-08 ENCOUNTER — Other Ambulatory Visit (HOSPITAL_COMMUNITY): Payer: Self-pay

## 2021-05-09 ENCOUNTER — Telehealth: Payer: Self-pay | Admitting: Pharmacist

## 2021-05-09 NOTE — Chronic Care Management (AMB) (Signed)
    Chronic Care Management Pharmacy Assistant   Name: Nava Hazelrigg  MRN: [id number] DOB: [birth year]  Reason for Encounter: General Adherence Call   Recent office visits:  None  Recent consult visits:  None  Hospital visits:  None in previous 6 months  Medications: Outpatient Encounter Medications as of 05/09/2021  Medication Sig  . acetaminophen (TYLENOL) 325 MG tablet Take 2 tablets (650 mg total) by mouth every 6 (six) hours as needed for mild pain (or Fever >/= 101).  Marland Kitchen albuterol (PROAIR HFA) 108 (90 Base) MCG/ACT inhaler Inhale 2 puffs into the lungs every 6 (six) hours as needed for wheezing.  Marland Kitchen allopurinol (ZYLOPRIM) 300 MG tablet Take 1 tablet (300 mg total) by mouth daily.  Marland Kitchen amoxicillin-clavulanate (AUGMENTIN) 875-125 MG tablet TAKE 1 TABLET BY MOUTH EVERY 12 HOURS FOR 9 DAYS  . Ascorbic Acid (VITAMIN C) 100 MG tablet Take 10 tablets (1,000 mg total) by mouth daily. Takes C Complex  . ascorbic acid (VITAMIN C) 500 MG tablet Take 1,000 mg by mouth in the morning.  Marland Kitchen aspirin EC 325 MG EC tablet Take 1 tablet (325 mg total) by mouth daily.  . bisoprolol (ZEBETA) 5 MG tablet Take 1 tablet (5 mg total) by mouth daily.  . Cholecalciferol (VITAMIN D3) 125 MCG (5000 UT) TABS Take 5,000 Units by mouth daily.  . cholecalciferol (VITAMIN D3) 25 MCG (1000 UNIT) tablet Take 5 tablets (5,000 Units total) by mouth daily.  . digoxin (LANOXIN) 0.125 MG tablet TAKE 1/2 TABLET (0.'0625MG'$  TOTAL) BY MOUTH DAILY  . famotidine (PEPCID) 40 MG tablet Take 1 tablet (40 mg total) by mouth daily.  . feeding supplement, GLUCERNA SHAKE, (GLUCERNA SHAKE) LIQD Take 237 mLs by mouth 2 (two) times daily between meals.  . ferrous sulfate 325 (65 FE) MG tablet Take 1 tablet (325 mg total) by mouth 2 (two) times daily with a meal.  . latanoprost (XALATAN) 0.005 % ophthalmic solution Place 1 drop into both eyes at bedtime.   Marland Kitchen levothyroxine (SYNTHROID) 25 MCG tablet Take 1 tablet (25 mcg total) by mouth in  the morning.  . metFORMIN (GLUCOPHAGE) 500 MG tablet Take 1 tablet (500 mg total) by mouth daily with breakfast.  . potassium chloride SA (KLOR-CON) 20 MEQ tablet Take 2 tablets (40 mEq total) by mouth daily.  . simvastatin (ZOCOR) 40 MG tablet Take 1 tablet (40 mg total) by mouth daily. (Patient taking differently: Take 40 mg by mouth at bedtime. )   No facility-administered encounter medications on file as of 05/09/2021.   Third unsuccessful telephone outreach was attempted today. Left several messages and no return.  Star Rating Drugs: Medication Dispensed  Quantity Pharmacy  Metformin 500 mg 11.22.2021 [facility]    Amilia Revonda Standard, [place] Pharmacist Assistant [phone]

## 2021-05-15 DIAGNOSIS — S42309A Unspecified fracture of shaft of humerus, unspecified arm, initial encounter for closed fracture: Secondary | ICD-10-CM

## 2021-05-15 HISTORY — DX: Unspecified fracture of shaft of humerus, unspecified arm, initial encounter for closed fracture: S42.309A

## 2021-06-20 ENCOUNTER — Telehealth (INDEPENDENT_AMBULATORY_CARE_PROVIDER_SITE_OTHER): Payer: Self-pay

## 2021-06-20 NOTE — Telephone Encounter (Signed)
Called and left a voicemail requesting a call back. Patient needs to be scheduled for a consult for a second opinion. Patient has aortic dissection and is s/p repair in 2021 done in [place]. Please obtain discharge summary, post operative notes, imaging report. Also will need most recent follow up office notes with provider in [place] possibly [facility], need OV notes, CT/CTA, [place]) -- CDs will need to be brought to appointment or mailed prior -- please book appointment with eitherDr. Graciela Husbands or Dr. Alycia Rossetti.

## 2021-07-02 ENCOUNTER — Encounter (INDEPENDENT_AMBULATORY_CARE_PROVIDER_SITE_OTHER): Payer: Self-pay | Admitting: Surgery

## 2021-07-02 NOTE — Progress Notes (Addendum)
Cardiac Surgery History and Physical    Patient Name: Jessica [name] Age: 86 y.o.   Visit Date: 07/31/2021  Gender: female   Date of Birth: April 02, 1935 Race:  Other   Scheduled Surgeon: Jessica Picket MD H&P Performed by: Jessica Miyamoto NP   Referring Physician(s): None   Primary Care Physician: Pcp, None, [place]     Jessica [name] is a 86 y.o. 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  Since her surgery, she has been followed with regular scans in [place].  Pt's daughter lives in area and is looking to establish care for pt in [place] as well as obtain second opinion.  CTA Chest February 2021 demonstrates DeBakey 1/Stanford A dissection s/p ascending aortic graft with residual section extending distally.  Cholelithiasis wo cholecystitis and diverticulosis wo evidence of diverticulitis.  Most recent CTA C/A/P w contrast May 2022 demonstrates moderate LM, LAD, RCA coronary artery calcifications.  No AV calcification.  Ascending thoracic aortic graft. Interval increase in size of descending thoracic aortic dissection aneurysm.  Arch 5.1cm.  distal thoracic aorta 5.4cm. Interval significant increase in size of entrance fenestration at aortic arch.  Interval well defined likely mult new fenestrations in mid and distal thoracic aortic aneurysm.  Similar appearance of dissection extends distally into descending thoracic and abdominal aorta with short extension into right common iliac.  Secondary filling of false lumen with contrast via fenestrations at level of celiac axis as well as level of IMA.  Celiac axis fills off true lumen with mild ostial narrowing.  SMA fills off true lumen with moderate ostial stenosis with associated calcifications.  IMA fills off true lumen.  Left renal artery fills off true lumen. Right renal artery smaller and fills faintly off false lumen.  Stable in size of abdominal aortic  aneurysm. 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  Mult subcentimeter mediastinal lymph nodes.  Upper lobe predominant centrilobular emphysema.  LLL compressive atelectasis.  Atrophic right kidney with poor perfusion.  0.5cm left kidney lower pole cyst.  Most recent Echo February 2022 shows LVEF 55-60%.  Septal motion consistent with postoperative status, basal inferoseptum hypokinetic.  Mild interatrial shunt.  Trileaflet AV, AV mildly calcified, no AS, moderate-severe AR, AVA 2.6cm2.  Calcification of mitral annulus, no evidence of MVP, no MS, mild-moderate MR.  No TS.  Severe TR.  Severe PHTN, RVSP .  RA .  No PS, trace PR.      Pt presents today for second opinion regarding TEVAR with daughter, Jessica [name], who lives in [place]. She presents in a wheelchair but usually walks with a cane at home. She admits to chronic fatigue , lost 50 lbs since sept 2021, and decreased appetite. Denies any dizziness, palpitations, vision changes, chest pain or  abdominal pain. She denies any residual effects of her previous TIA. Left humerus fracture on 06/08/21, left arm in arm sling.     Pt was initially living in [place]. Then moved to [place] to live with her son.   She currently has one functioning kidney and diagnosed with stage 3 CKD.     Was seeing Nephrology, ophthamology, dentist, PCP in [place] but has never seen a neurologist.     STS data points  Diabetes: [x] Yes [] No    Control:  [x] None (due to poor appetite) [] Diet, [] Oral, [] Insulin, [] Other injectable  Dyslipidemia: [x] Yes [] No  HTN: [x] Yes [] No  Chronic Lung Disease: [] None [] mild [] moderate [] severe [x] unknown   PFT/Spirometry: FEV1  percent predicted   Current use of Inhalers/Steroids: [] Yes [] No  Sleep Apnea: [] Yes [x] No  Pneumonia: [] Yes [x] No  Depression: [] Yes [x] No  Liver Disease: [] Yes [x] No  Cirrhosis [] Yes [x] No  If Yes, Child-Pugh Class [] A [] B [] C [] unknown  PAD/PVD: [] Yes [x] No  Immunocompromised: [] Yes [x] No  Cancer (within 5 years):  [] Yes [x] No  Chest Wall Deformity: [] Yes [x] No  Mediastinal Radiation: [] Yes [x] No  Syncope (cardiac-related/within 1 year): [] Yes [x] No  Arrhythmias: [] Yes [x] No  Hx of Endocarditis: [] Yes [x] No [] Active [] Treated [] Organism:   Illicit Drug Use [] Yes [x] No   IV drug use within 1 year [] Yes [] No   Any drug use within 30 days of procedure [] Yes [] No    Past Medical History:   Diagnosis Date    Atrophic kidney     Right    CAD (coronary artery disease)     Dissection of thoracic aorta     Type A dissection    Iron deficiency anemia     TIA (transient ischemic attack) 07/01/2001     Past Surgical History:   Procedure Laterality Date    APPENDECTOMY (OPEN)      GLAUCOMA SURGERY Bilateral     HYSTERECTOMY      MOUTH SURGERY      tonsillectomy    s/p hemiarch repair       Social History     Tobacco Use    Smoking status: Former     Packs/day: 1.00     Years: 20.00     Pack years: 20.00     Types: Cigarettes     Quit date: 1990     Years since quitting: 32.6    Smokeless tobacco: Never   Vaping Use    Vaping Use: Never used   Substance Use Topics    Alcohol use: Not Currently    Drug use: Never      Social History     Social History Narrative    Not on file      History reviewed. No pertinent family history.    Radiology and Test Review:  MRI brain wo contrast 04/23/21:  [facility]) advanced microvascular ischemic changes, chronic lacunar infarcts, remote microhemorrhages likely sequela of chronic hypertensive vasculopathy.  Small area of encephalomalacia of left inferolateral occipital lobe, likely remote inferior MCA-PCA border zone infarct.  Mild associated hemosiderin deposition.    CTA c/a/p w contrast 04/15/21:  [facility]) moderate LM, LAD, RCA coronary artery calcifications.  No AV calcification.  Asc thora ao graft.  Normal filling into great vessels.  Interval increase in size of desc thora ao dissection aneurysm.  Arch 5.1cm.  distal thora ao 5.4cm.  interval significant increase  in size of entrance fenestration at ao arch.  Interval well defined likely mult new fenestrations I mid and dis thora ao aneurysm.  Similar appearance of dissection extends distally into desc thora and abd ao with short extension into right common iliac.  Secondary filling of false lumen with contrast via fenestrations at level of celiac axis as well as level of IMA.  Celiac axis fills off true lumen with mild ostial narrowing.  SMA fills off true lumen with moderate ostial stenosis with associated calcifications.  IMA fills off true lumen.  Left renal artery fills off true lumen.  Right renal artery smaller and fills faintly off false lumen.  Stable in size of abd ao aneurysm.  Patent flow  into both hypogastrics as well as both ext iliacs and both common femorals.  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  No ostial stenosis of profunda nor SFA.  Mult subcentimeter mediastinal lymph nodes.  Upper lobe predominant centrilobular emphysema.  LLL compressive atelectasis.  Trace left pleural effusion.  Tiny right hepatic lobe cyst.  Cholelithiasis.  Atrophic right kidney with poor perfusion.  0.5cm left kidney lower pole cyst.    TTE 01/16/21:  [facility]) mild concentric LVH.  LVEF 55-60%.  Septal motion consistent with postoperative status, basal inferoseptum hypokinetic.  Mild interatrial shunt.  Trileaflet AV, AV mildly calcified, no AS, moderate-severe AR, AVA 2.6cm2.  Calcification of mitral annulus, no evidence of MVP, no MS, mild-moderate MR.  No TS.  Severe TR.  Severe PHTN, RVSP .  RA .  No PS, trace PR.      Carotid doppler 01/16/21:  [facility]) small plaque in both bifurcations of no hemodynamic significance.    CTA chest w contrast 01/15/21:  [facility]) LM calcification.  No AV calcification seen.  Asc thora ao graft seen. Normal filling into great vessels.  Just distal to left subclavian there is filling into false lumen for short  distance of known desc thora ao dissection.  Dissection extends distally into desc thora and abd ao with very short extension into right common iliac.  Secondary filling of false lumen with contrast via fenestration at level of celiac axis as well as level of IMA.  No ostial stenosis of profunda nor SFA.  Celiac axis fills off true lumen with 30% ostial narrowing.  SMA fills off true lumen with 50-70% ostial stenosis with associated calcifications.   IMA fills off true lumen.  Left renal artery fills off true lumen.  Right renal artery smaller and fills faintly off false lumen.  Increased number of mediastinal and hilar lymph nodes.  Upper lobe predominant centrilobular emphysema.  Delayed LLL compressive atelectasis.  Small left pleural effusion.    CT chest w contrast 01/02/21:  [facility]) s/p asc ao repair with graft in place from sinotubular junction to just prox to innominate artery.  Aneurysmal dilatation including false lumen seen in ao arch, desc thora ao, but distal extent not certain wo contrast and measuring up to 4.7cm within mid desc ao.  No AV calcifications seen.  Calcifications of pLAD and lesser so LCx arteries.  Surgical suture of right atrial appendage as well as superior aspect of right pulmonary vein likely r/t bypass surgery.  Increased number of mediastinal and bilateral axillary lymph nodes.  Moderate centrilobular and paraseptal emphysema.  Atelectasis LLL.  Trace b/l pleural effusions.  Diverticulosis wo evidence of acute diverticulitis.  S/p sternotomy with mult sternotomy wires intact.    Review of Systems   Constitutional:  Positive for appetite change and fatigue. Negative for activity change, chills, diaphoresis, fever and unexpected weight change.   HENT:  Positive for dental problem (needs full dentures, has a bridge, partial plate on top.). Negative for drooling, ear discharge and trouble swallowing.    Eyes:  Negative for visual disturbance.   Respiratory:  Negative.  Negative for cough, chest tightness, shortness of breath and wheezing.    Cardiovascular: Negative.  Negative for chest pain, palpitations and leg swelling.        Denies orthopnea, paroxysmal nocturnal dyspnea, claudication, varicose veins, or history of thrombosis.    Gastrointestinal: Negative.  Negative for abdominal pain, blood in stool,  constipation, diarrhea, nausea and vomiting.        Denies indigestion, jaundice, or hepatitis. Hx of GERD, hx of gastric ulcers.    Endocrine: Negative for cold intolerance, heat intolerance, polydipsia and polyuria.        Denies history of thyroid problems.   Genitourinary:  Negative for difficulty urinating, dysuria, frequency, hematuria and urgency.        Endorses nocturia , 2-3x    Musculoskeletal:  Positive for back pain (chronic , lower). Negative for arthralgias, myalgias and neck pain.        Hx  gout. Hx arthritis in knees . Left arm fx.    Skin: Negative.  Negative for rash.        Denies any hair or nail changes, or skin lesions   Neurological:  Negative for dizziness, seizures, syncope, facial asymmetry, weakness, light-headedness and headaches.        Denies history of stroke . Hx of TIA.   Hematological:  Does not bruise/bleed easily.        Denies bleeding disorders, blood dyscrasias, or any history of cancer. Hx anemia    Psychiatric/Behavioral:  Negative for dysphoric mood. The patient is not nervous/anxious.      Allergies   Allergen Reactions    Lactobacillus      Current Outpatient Medications   Medication Sig Dispense Refill    acetaminophen-codeine (TYLENOL #2) 300-15 MG per tablet Take 1 tablet by mouth every 4 (four) hours as needed (Patient not taking: Reported on 07/31/2021)      albuterol (PROVENTIL) 2 MG tablet Take 2 mg by mouth 3 (three) times daily (Patient not taking: Reported on 07/31/2021)      allopurinol (ZYLOPRIM) 300 MG tablet Take 300 mg by mouth daily      ascorbic acid (VITAMIN C) 100 MG tablet Take 100 mg by mouth daily       aspirin EC 325 MG tablet Take 325 mg by mouth daily      atorvastatin (LIPITOR) 40 MG tablet Take 40 mg by mouth daily      bisoprolol (ZEBETA) 5 MG tablet Take 2.5 mg by mouth daily      cyproheptadine (PERIACTIN) 4 MG tablet Take 4 mg by mouth 3 (three) times daily as needed      digoxin (LANOXIN) 0.25 MG tablet Take 125 mcg by mouth      famotidine (PEPCID) 40 MG tablet Take 40 mg by mouth daily      ferrous sulfate 325 (65 FE) MG tablet Take 325 mg by mouth every morning with breakfast      furosemide (LASIX) 40 MG tablet Take 40 mg by mouth daily      Latanoprost 0.005 % Emulsion Apply to eye      levothyroxine (SYNTHROID) 25 MCG tablet Take 25 mcg by mouth      potassium chloride (KLOR-CON) 10 MEQ tablet Take 10 mEq by mouth      vitamin D (CHOLECALCIFEROL) 25 MCG (1000 UT) tablet Take 1,000 Units by mouth daily      albuterol sulfate HFA (PROVENTIL) 108 (90 Base) MCG/ACT inhaler Inhale 2 puffs into the lungs every 6 (six) hours as needed for Wheezing      mupirocin (BACTROBAN) 2 % ointment Apply topically 2 (two) times daily Apply to Both Nostrils 2x Daily for 5 Days Prior to Surgery 22 g 0     No current facility-administered medications for this visit.        BP 115/70 (BP  Site: Right arm, Patient Position: Sitting)   Pulse 84   Temp 98.1 F (36.7 C) (Oral)   Resp 16   Ht 1.626 m (5\' 4" )   Wt 51.3 kg (113 lb)   SpO2 95%   BMI 19.40 kg/m       Objective:   Physical Exam  Constitutional:       General: She is not in acute distress.     Appearance: Normal appearance. She is not ill-appearing.      Comments: Pt is in wheelchair   HENT:      Mouth/Throat:      Dentition: Normal dentition. No gum lesions.   Eyes:      General: No scleral icterus.        Right eye: No discharge.         Left eye: No discharge.      Extraocular Movements: Extraocular movements intact.      Conjunctiva/sclera: Conjunctivae normal.   Neck:      Vascular: No carotid bruit or JVD.   Cardiovascular:      Rate and Rhythm: Normal  rate and regular rhythm.      Chest Wall: PMI is not displaced.      Pulses:           Femoral pulses are 2+ on the right side and 2+ on the left side.       Dorsalis pedis pulses are 2+ on the right side and 2+ on the left side.        Posterior tibial pulses are 2+ on the right side and 2+ on the left side.      Heart sounds: S1 normal and S2 normal. Murmur heard.   Diastolic murmur is present with a grade of 2/4.     No friction rub. No gallop.   Pulmonary:      Effort: Pulmonary effort is normal. No respiratory distress or retractions.      Breath sounds: Normal breath sounds. No stridor. No wheezing, rhonchi or rales.   Chest:      Chest wall: No mass, deformity or tenderness.   Abdominal:      General: Bowel sounds are normal.      Palpations: Abdomen is soft.   Musculoskeletal:         General: Signs of injury (left UE) present.      Cervical back: Normal range of motion and neck supple. No rigidity or tenderness.      Right lower leg: No edema.      Left lower leg: No edema.   Lymphadenopathy:      Cervical: No cervical adenopathy.   Skin:     General: Skin is warm and dry.      Capillary Refill: Capillary refill takes less than 2 seconds.      Findings: No lesion or rash.      Comments: Healed sternotomy scar   Neurological:      General: No focal deficit present.      Mental Status: She is alert.      Gait: Gait is intact.   Psychiatric:         Mood and Affect: Affect normal.         Behavior: Behavior is cooperative.        Labs:  No results found for: WBC, HGB, HCT, MCV, PLT  No results found for: NA, K, CL, CO2, GLU, BUN, CREAT, BUNCRRATIO, CA, PROT, ALB, GLOB, AGRATIO, BILITOTAL, AST, ALT, ALKPHOS,  EGFR  No results found for: HGBA1C      Diagnosis:   1. Preop cardiovascular exam    2. Dissection of thoracic aorta    3. Anemia, unspecified type    4. Stage 3b chronic kidney disease      Assessment/Plan:     86 year old female with history of type 1 dissection repair in 2021, aneurysmal chronic type 3B  aortic dissection. Outside CTA imaging was reviewed and demonstrates 51 mm in Zone 3 and 62 mm in zone 5, both are dissected and there are also areas of dissection mural thrombus and intramural hematoma.  As per Dr. Alycia Rossetti, Zone 3 is tenable as a zone 5 so TEVAR is recommended. Dr. Alycia Rossetti discussed surgical options with pt in great detail and her daughter to include benefits as well as the risks of dissection or other complications. Pt and her daughter would like to discuss with family members and call back if they would like to proceed.     All preop, periop orders, teaching, bactroban will need to be placed.   As per Dr. Alycia Rossetti, renal clearance is not needed. Dr. Alycia Rossetti recommends doing it with IR so we can use CO2 with Dr. Ruthine Dose.   Pt encouraged to establish care with hematology, cardiology, nephrology and PCP.     Jessica Miyamoto FNP  Cardiac Surgery

## 2021-07-03 ENCOUNTER — Encounter (INDEPENDENT_AMBULATORY_CARE_PROVIDER_SITE_OTHER): Payer: Self-pay | Admitting: Surgery

## 2021-07-03 ENCOUNTER — Ambulatory Visit (INDEPENDENT_AMBULATORY_CARE_PROVIDER_SITE_OTHER): Payer: Medicare PPO | Admitting: Surgery

## 2021-07-03 VITALS — BP 115/70 | HR 84 | Temp 98.1°F | Resp 16 | Ht 64.0 in | Wt 113.0 lb

## 2021-07-03 DIAGNOSIS — I7101 Dissection of thoracic aorta: Secondary | ICD-10-CM

## 2021-07-03 DIAGNOSIS — D649 Anemia, unspecified: Secondary | ICD-10-CM

## 2021-07-03 DIAGNOSIS — N1832 Chronic kidney disease, stage 3b: Secondary | ICD-10-CM

## 2021-07-03 DIAGNOSIS — Z01810 Encounter for preprocedural cardiovascular examination: Secondary | ICD-10-CM

## 2021-07-03 DIAGNOSIS — I71019 Dissection of thoracic aorta, unspecified: Secondary | ICD-10-CM

## 2021-07-08 ENCOUNTER — Encounter (INDEPENDENT_AMBULATORY_CARE_PROVIDER_SITE_OTHER): Payer: Self-pay | Admitting: Surgery

## 2021-07-09 ENCOUNTER — Encounter (INDEPENDENT_AMBULATORY_CARE_PROVIDER_SITE_OTHER): Payer: Self-pay | Admitting: Surgery

## 2021-07-10 ENCOUNTER — Encounter (INDEPENDENT_AMBULATORY_CARE_PROVIDER_SITE_OTHER): Payer: Self-pay | Admitting: Surgery

## 2021-07-16 ENCOUNTER — Telehealth (INDEPENDENT_AMBULATORY_CARE_PROVIDER_SITE_OTHER): Payer: Self-pay | Admitting: Surgery

## 2021-07-16 NOTE — Telephone Encounter (Signed)
Dr. Alycia Rossetti [facility])  Surgery - August 06, 2021  TEVAR -- being done in Interventional Radiology  Same Day Admission

## 2021-07-22 ENCOUNTER — Ambulatory Visit
Admission: RE | Admit: 2021-07-22 | Discharge: 2021-07-22 | Disposition: A | Discharge status: Home / Self Care | Payer: Self-pay | Source: Ambulatory Visit

## 2021-07-22 ENCOUNTER — Other Ambulatory Visit: Payer: Self-pay

## 2021-07-29 ENCOUNTER — Encounter (INDEPENDENT_AMBULATORY_CARE_PROVIDER_SITE_OTHER): Payer: Self-pay

## 2021-07-29 ENCOUNTER — Other Ambulatory Visit (INDEPENDENT_AMBULATORY_CARE_PROVIDER_SITE_OTHER): Payer: Self-pay

## 2021-07-29 DIAGNOSIS — I71019 Dissection of thoracic aorta, unspecified: Secondary | ICD-10-CM

## 2021-07-29 DIAGNOSIS — Z01810 Encounter for preprocedural cardiovascular examination: Secondary | ICD-10-CM

## 2021-07-29 DIAGNOSIS — I7101 Dissection of thoracic aorta: Secondary | ICD-10-CM

## 2021-07-29 NOTE — Progress Notes (Signed)
Cardiac Surgery History and Physical    Patient Name: Jessica [name] Age: 86 y.o.   Visit Date: 07/31/2021  Gender: female   Date of Birth: July 01, 1935 Race:  Other   Scheduled Surgeon: Lorin Picket MD H&P Performed by: Ronaldo Miyamoto NP   Referring Physician(s): None   Primary Care Physician: Pcp, None, MD     Jessica [name] is a 86 y.o. 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  Since her surgery, she has been followed with regular scans in [place].  Pt's daughter lives in this area and is looking to establish care for pt in [place] as well as obtain second opinion.  CTA Chest February 2021 demonstrates DeBakey 1/Stanford A dissection s/p ascending aortic graft with residual section extending distally. Cholelithiasis wo cholecystitis and diverticulosis wo evidence of diverticulitis.  Most recent CTA C/A/P w contrast May 2022 demonstrates moderate LM, LAD, RCA coronary artery calcifications.  No AV calcification.  Ascending thoracic aortic graft. Interval increase in size of descending thoracic aortic dissection aneurysm.  Arch 5.1cm.  Distal thoracic aorta 5.4cm. Interval significant increase in size of entrance fenestration at aortic arch.  Interval well defined likely mult new fenestrations in mid and distal thoracic aortic aneurysm.  Similar appearance of dissection extends distally into descending thoracic and abdominal aorta with short extension into right common iliac.  Secondary filling of false lumen with contrast via fenestrations at level of celiac axis as well as level of IMA.  Celiac axis fills off true lumen with mild ostial narrowing.  SMA fills off true lumen with moderate ostial stenosis with associated calcifications.  IMA fills off true lumen.  Left renal artery fills off true lumen. Right renal artery smaller and fills faintly off false lumen.  Stable in size of abdominal  aortic aneurysm. 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  Mult subcentimeter mediastinal lymph nodes.  Upper lobe predominant centrilobular emphysema.  LLL compressive atelectasis.  Atrophic right kidney with poor perfusion.  0.5cm left kidney lower pole cyst.  Most recent Echo February 2022 shows LVEF 55-60%.  Septal motion consistent with postoperative status, basal inferoseptum hypokinetic.  Mild interatrial shunt.  Trileaflet AV, AV mildly calcified, no AS, moderate-severe AR, AVA 2.6cm2.  Calcification of mitral annulus, no evidence of MVP, no MS, mild-moderate MR.  No TS.  Severe TR.  Severe PHTN, RVSP .  RA .  No PS, trace PR.      Pt presented for second opinion regarding TEVAR on 07/03/21. She presents in a wheelchair but usually walks with a cane at home. She admits to chronic fatigue, lost 50 lbs since sept 2021, and decreased appetite. Denies any dizziness, palpitations, vision changes, chest pain or  abdominal pain. She denies any residual effects of her previous TIA that occurred approximately twenty years ago.     Pt was initially living in [place]. Then moved to [place] to live with her son. She currently has one functioning kidney and diagnosed with stage 3 CKD. Was seeing Nephrology, ophthamology, dentist, PCP in [place] but has never seen a neurologist.    Pt is scheduled to undergo TEVAR with Dr. Alycia Rossetti, Dr. Marcy Panning, and Dr. Arbie Cookey on 08/06/21.  Pt presents today for updated history and physical and preop teaching with both daughters. She is still in left arm sling for previous humerus fracture. She continues to note fatigue and fair  appetite but otherwise denies any other symptoms. Denies chest pain , shortness of breath, dizziness, headache, vision changes, trouble swallowing or new back pain. Hx of anemia, had iron infusions while living in [place] but she is unsure when. She is unsure of name of hematologist.     STS data points  Diabetes: [x] Yes [] No    Control:  [x] None (due to  poor appetite) [] Diet, [] Oral, [] Insulin, [] Other injectable  Dyslipidemia: [x] Yes [] No  HTN: [x] Yes [] No   Chronic Lung Disease: [] None [] mild [] moderate [] severe [x] unknown   PFT/Spirometry: FEV1  percent predicted   Current use of Inhalers/Steroids: [] Yes [] No  Sleep Apnea: [] Yes [x] No  Pneumonia: [] Yes [x] No  Depression: [] Yes [x] No  Liver Disease: [] Yes [x] No  Cirrhosis [] Yes [x] No  If Yes, Child-Pugh Class [] A [] B [] C [] unknown  PAD/PVD: [] Yes [x] No  Immunocompromised: [] Yes [x] No  Cancer (within 5 years): [] Yes [x] No  Chest Wall Deformity: [] Yes [x] No  Mediastinal Radiation: [] Yes [x] No  Syncope (cardiac-related/within 1 year): [] Yes [x] No  Arrhythmias: [] Yes [x] No  Hx of Endocarditis: [] Yes [x] No [] Active [] Treated [] Organism:   Illicit Drug Use [] Yes [x] No   IV drug use within 1 year [] Yes [] No   Any drug use within 30 days of procedure [] Yes [] No    Past Medical History:   Diagnosis Date    Atrophic kidney     Right    CAD (coronary artery disease)     Dissection of thoracic aorta     Type A dissection    Iron deficiency anemia     TIA (transient ischemic attack) 07/01/2001     Past Surgical History:   Procedure Laterality Date    APPENDECTOMY (OPEN)      GLAUCOMA SURGERY Bilateral     HYSTERECTOMY      MOUTH SURGERY      tonsillectomy    s/p hemiarch repair       Social History     Tobacco Use    Smoking status: Former     Packs/day: 1.00     Years: 20.00     Pack years: 20.00     Types: Cigarettes     Quit date: 1990     Years since quitting: 32.6    Smokeless tobacco: Never   Vaping Use    Vaping Use: Never used   Substance Use Topics    Alcohol use: Not Currently    Drug use: Never      Social History     Social History Narrative    Not on file      History reviewed. No pertinent family history.    Radiology and Test Review:  MRI brain wo contrast 04/23/21:  [facility]) advanced microvascular ischemic changes, chronic lacunar infarcts, remote microhemorrhages likely sequela of chronic  hypertensive vasculopathy.  Small area of encephalomalacia of left inferolateral occipital lobe, likely remote inferior MCA-PCA border zone infarct.  Mild associated hemosiderin deposition.    CTA c/a/p w contrast 04/15/21:  [facility]) moderate LM, LAD, RCA coronary artery calcifications.  No AV calcification.  Asc thora ao graft.  Normal filling into great vessels.  Interval increase in size of desc thora ao dissection aneurysm.  Arch 5.1cm.  Distal thoracic aorta 5.4cm.  interval significant increase in size of entrance fenestration at ao arch.  Interval well defined likely mult new fenestrations I mid and dis thora ao aneurysm.  Similar appearance of dissection extends distally into desc thora and abd ao with short extension into right common  iliac.  Secondary filling of false lumen with contrast via fenestrations at level of celiac axis as well as level of IMA.  Celiac axis fills off true lumen with mild ostial narrowing.  SMA fills off true lumen with moderate ostial stenosis with associated calcifications.  IMA fills off true lumen.  Left renal artery fills off true lumen.  Right renal artery smaller and fills faintly off false lumen.  Stable in size of abd ao aneurysm.  Patent flow into both hypogastrics as well as both ext iliacs and both common femorals.  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  No ostial stenosis of profunda nor SFA.  Mult subcentimeter mediastinal lymph nodes.  Upper lobe predominant centrilobular emphysema.  LLL compressive atelectasis.  Trace left pleural effusion.  Tiny right hepatic lobe cyst.  Cholelithiasis.  Atrophic right kidney with poor perfusion.  0.5cm left kidney lower pole cyst.    TTE 01/16/21:  [facility]) mild concentric LVH.  LVEF 55-60%.  Septal motion consistent with postoperative status, basal inferoseptum hypokinetic.  Mild interatrial shunt.  Trileaflet AV, AV mildly calcified, no AS, moderate-severe AR, AVA 2.6cm2.   Calcification of mitral annulus, no evidence of MVP, no MS, mild-moderate MR.  No TS.  Severe TR.  Severe PHTN, RVSP .  RA .  No PS, trace PR.      Carotid doppler 01/16/21:  [facility]) small plaque in both bifurcations of no hemodynamic significance.    CTA chest w contrast 01/15/21:  [facility]) LM calcification.  No AV calcification seen.  Asc thora ao graft seen. Normal filling into great vessels.  Just distal to left subclavian there is filling into false lumen for short distance of known desc thora ao dissection.  Dissection extends distally into desc thora and abd ao with very short extension into right common iliac.  Secondary filling of false lumen with contrast via fenestration at level of celiac axis as well as level of IMA.  No ostial stenosis of profunda nor SFA.  Celiac axis fills off true lumen with 30% ostial narrowing.  SMA fills off true lumen with 50-70% ostial stenosis with associated calcifications.   IMA fills off true lumen.  Left renal artery fills off true lumen.  Right renal artery smaller and fills faintly off false lumen.  Increased number of mediastinal and hilar lymph nodes.  Upper lobe predominant centrilobular emphysema.  Delayed LLL compressive atelectasis.  Small left pleural effusion.    CT chest w contrast 01/02/21:  [facility]) s/p asc ao repair with graft in place from sinotubular junction to just prox to innominate artery.  Aneurysmal dilatation including false lumen seen in ao arch, desc thora ao, but distal extent not certain wo contrast and measuring up to 4.7cm within mid desc ao.  No AV calcifications seen.  Calcifications of pLAD and lesser so LCx arteries.  Surgical suture of right atrial appendage as well as superior aspect of right pulmonary vein likely r/t bypass surgery.  Increased number of mediastinal and bilateral axillary lymph nodes.  Moderate centrilobular and paraseptal emphysema.  Atelectasis LLL.   Trace b/l pleural effusions.  Diverticulosis wo evidence of acute diverticulitis.  S/p sternotomy with mult sternotomy wires intact.    Review of Systems   Constitutional:  Positive for appetite change and fatigue. Negative for activity change, chills, diaphoresis, fever and unexpected weight change.   HENT:  Positive for dental problem (needs full dentures, has a bridge,  partial plate on top. denies dental pain.). Negative for drooling, ear discharge and trouble swallowing.    Eyes:  Negative for visual disturbance.   Respiratory: Negative.  Negative for cough, chest tightness, shortness of breath and wheezing.    Cardiovascular: Negative.  Negative for chest pain, palpitations and leg swelling.        Denies orthopnea, paroxysmal nocturnal dyspnea, claudication, varicose veins, or history of thrombosis.    Gastrointestinal: Negative.  Negative for abdominal pain, blood in stool, constipation, diarrhea, nausea and vomiting.        Denies indigestion, jaundice, or hepatitis. Hx of GERD, hx of gastric ulcers.    Endocrine: Negative for cold intolerance, heat intolerance, polydipsia and polyuria.        Denies history of thyroid problems.   Genitourinary:  Negative for difficulty urinating, dysuria, frequency, hematuria and urgency.        Endorses nocturia , 2-3x    Musculoskeletal:  Positive for back pain (chronic , lower. uses cane.). Negative for arthralgias, myalgias and neck pain.        Hx  gout. Hx arthritis in knees    Skin: Negative.  Negative for rash.        Denies any hair or nail changes, or skin lesions   Neurological:  Negative for dizziness, tremors, seizures, syncope, facial asymmetry, weakness, light-headedness, numbness and headaches.        Denies history of stroke . Hx of TIA.   Hematological:  Does not bruise/bleed easily.        Denies  bleeding disorders, blood dyscrasias, or any history of cancer. Hx anemia, had iron infusions when living in [place].    Psychiatric/Behavioral:  Negative for  dysphoric mood. The patient is not nervous/anxious.      Allergies   Allergen Reactions    Lactobacillus      Current Outpatient Medications   Medication Sig Dispense Refill    albuterol sulfate HFA (PROVENTIL) 108 (90 Base) MCG/ACT inhaler Inhale 2 puffs into the lungs every 6 (six) hours as needed for Wheezing      allopurinol (ZYLOPRIM) 300 MG tablet Take 300 mg by mouth daily      ascorbic acid (VITAMIN C) 100 MG tablet Take 100 mg by mouth daily      aspirin EC 325 MG tablet Take 325 mg by mouth daily      atorvastatin (LIPITOR) 40 MG tablet Take 40 mg by mouth daily      bisoprolol (ZEBETA) 5 MG tablet Take 2.5 mg by mouth daily      cyproheptadine (PERIACTIN) 4 MG tablet Take 4 mg by mouth 3 (three) times daily as needed      digoxin (LANOXIN) 0.25 MG tablet Take 125 mcg by mouth      famotidine (PEPCID) 40 MG tablet Take 40 mg by mouth daily      ferrous sulfate 325 (65 FE) MG tablet Take 325 mg by mouth every morning with breakfast      furosemide (LASIX) 40 MG tablet Take 40 mg by mouth daily      Latanoprost 0.005 % Emulsion Apply to eye      levothyroxine (SYNTHROID) 25 MCG tablet Take 25 mcg by mouth      potassium chloride (KLOR-CON) 10 MEQ tablet Take 10 mEq by mouth      vitamin D (CHOLECALCIFEROL) 25 MCG (1000 UT) tablet Take 1,000 Units by mouth daily      acetaminophen-codeine (TYLENOL #2) 300-15 MG per tablet Take  1 tablet by mouth every 4 (four) hours as needed (Patient not taking: Reported on 07/31/2021)      albuterol (PROVENTIL) 2 MG tablet Take 2 mg by mouth 3 (three) times daily (Patient not taking: Reported on 07/31/2021)      mupirocin (BACTROBAN) 2 % ointment Apply topically 2 (two) times daily Apply to Both Nostrils 2x Daily for 5 Days Prior to Surgery 22 g 0     No current facility-administered medications for this visit.        BP 106/70 (BP Site: Right arm, Patient Position: Sitting, Cuff Size: Medium)   Pulse 80   Temp 97.6 F (36.4 C) (Temporal)   Resp 18   Ht 1.626 m (5\' 4" )    Wt 50.9 kg (112 lb 3.2 oz)   SpO2 96%   BMI 19.26 kg/m       Objective:   Physical Exam  Constitutional:       General: She is not in acute distress.     Appearance: Normal appearance. She is not ill-appearing.      Comments: Pt is in wheelchair   HENT:      Mouth/Throat:      Dentition: Normal dentition. No gum lesions.      Comments: +bridge  Eyes:      General: No scleral icterus.        Right eye: No discharge.         Left eye: No discharge.      Extraocular Movements: Extraocular movements intact.      Conjunctiva/sclera: Conjunctivae normal.   Neck:      Vascular: No carotid bruit or JVD.   Cardiovascular:      Rate and Rhythm: Normal rate and regular rhythm.      Chest Wall: PMI is not displaced.      Pulses:           Femoral pulses are 2+ on the right side and 2+ on the left side.       Dorsalis pedis pulses are 2+ on the right side and 2+ on the left side.        Posterior tibial pulses are 2+ on the right side and 2+ on the left side.      Heart sounds: S1 normal and S2 normal. Murmur heard.   Diastolic murmur is present with a grade of 2/4.     No friction rub. No gallop.   Pulmonary:      Effort: Pulmonary effort is normal. No respiratory distress or retractions.      Breath sounds: Normal breath sounds. No stridor. No wheezing, rhonchi or rales.   Chest:      Chest wall: No mass, deformity or tenderness.   Abdominal:      General: Bowel sounds are normal.      Palpations: Abdomen is soft.   Musculoskeletal:         General: Signs of injury present.      Cervical back: Normal range of motion and neck supple. No rigidity or tenderness.      Right lower leg: No edema.      Left lower leg: No edema.      Comments: +left arm sling, full ROM of left digits, cap refill nL.    Lymphadenopathy:      Cervical: No cervical adenopathy.   Skin:     General: Skin is warm and dry.      Capillary Refill: Capillary refill takes less than 2  seconds.      Findings: No lesion or rash.      Comments: Healed sternotomy  scar   Neurological:      General: No focal deficit present.      Mental Status: She is alert.      Gait: Gait is intact.   Psychiatric:         Mood and Affect: Affect normal.         Behavior: Behavior is cooperative.        Labs:  No results found for: WBC, HGB, HCT, MCV, PLT  No results found for: NA, K, CL, CO2, GLU, BUN, CREAT, BUNCRRATIO, CA, PROT, ALB, GLOB, AGRATIO, BILITOTAL, AST, ALT, ALKPHOS, EGFR  No results found for: HGBA1C      Diagnosis:   1. Pre-operative cardiovascular examination  - mupirocin (BACTROBAN) 2 % ointment; Apply topically 2 (two) times daily Apply to Both Nostrils 2x Daily for 5 Days Prior to Surgery  Dispense: 22 g; Refill: 0    2. Dissection of thoracic aorta    3. Stage 3b chronic kidney disease    4. Anemia, unspecified type      Assessment/Plan:     87 year old female with history of type 1 dissection repair in 2021, aneurysmal chronic type 3B aortic dissection. Outside CTA imaging demonstrated 51 mm in Zone 3 and 62 mm in zone 5, both are dissected and there are also areas of dissection mural thrombus and intramural hematoma.  As per Dr. Alycia Rossetti, Zone 3 is tenable as a zone 5 so TEVAR is recommended. Dr. Alycia Rossetti discussed surgical options with pt in great detail and her daughter at last office visit to include benefits as well as the risks of dissection or other complications.  TEVAR is scheduled for 08/06/21.     Preop and periop orders in place.   Teaching performed today by Gwyneth Sprout. Pt will proceed to [facility] today for preop testing.  Bactroban sent to pharm. Amio contraindicated as pt is already on digoxin.   As per Dr. Alycia Rossetti, renal clearance is not needed. Dr. Alycia Rossetti requesting IR so we can use CO2 with Dr. Ruthine Dose.     Encouraged to establish care with cardiology, nephrology, hematology and PCP. Has appt with Nephrology oct 2022 and will be seeing PCP Algis Downs Sept 2022.    Ronaldo Miyamoto FNP  Cardiac Surgery

## 2021-07-31 ENCOUNTER — Ambulatory Visit (INDEPENDENT_AMBULATORY_CARE_PROVIDER_SITE_OTHER): Payer: Medicare PPO | Admitting: Family

## 2021-07-31 ENCOUNTER — Ambulatory Visit (HOSPITAL_BASED_OUTPATIENT_CLINIC_OR_DEPARTMENT_OTHER): Payer: Medicare PPO

## 2021-07-31 ENCOUNTER — Ambulatory Visit
Admission: RE | Admit: 2021-07-31 | Discharge: 2021-07-31 | Disposition: A | Discharge status: Home / Self Care | Payer: Medicare PPO | Source: Ambulatory Visit | Attending: Adult Health | Admitting: Adult Health

## 2021-07-31 ENCOUNTER — Encounter (INDEPENDENT_AMBULATORY_CARE_PROVIDER_SITE_OTHER): Payer: Self-pay | Admitting: Family

## 2021-07-31 ENCOUNTER — Ambulatory Visit (INDEPENDENT_AMBULATORY_CARE_PROVIDER_SITE_OTHER): Payer: Medicare PPO

## 2021-07-31 VITALS — BP 106/70 | HR 80 | Temp 97.6°F | Resp 18 | Ht 64.0 in | Wt 112.2 lb

## 2021-07-31 DIAGNOSIS — I71019 Dissection of thoracic aorta, unspecified: Secondary | ICD-10-CM

## 2021-07-31 DIAGNOSIS — I7101 Dissection of thoracic aorta: Secondary | ICD-10-CM

## 2021-07-31 DIAGNOSIS — Z01810 Encounter for preprocedural cardiovascular examination: Secondary | ICD-10-CM

## 2021-07-31 DIAGNOSIS — D649 Anemia, unspecified: Secondary | ICD-10-CM

## 2021-07-31 DIAGNOSIS — N1832 Chronic kidney disease, stage 3b: Secondary | ICD-10-CM

## 2021-07-31 LAB — COMPREHENSIVE METABOLIC PANEL
ALT: 11 U/L (ref 0–55)
AST (SGOT): 18 U/L (ref 5–34)
Albumin/Globulin Ratio: 0.5 — ABNORMAL LOW (ref 0.9–2.2)
Albumin: 2.5 g/dL — ABNORMAL LOW (ref 3.5–5.0)
Alkaline Phosphatase: 72 U/L (ref 37–117)
Anion Gap: 9 (ref 5.0–15.0)
BUN: 23 mg/dL — ABNORMAL HIGH (ref 7.0–19.0)
Bilirubin, Total: 0.5 mg/dL (ref 0.2–1.2)
CO2: 28 mEq/L (ref 22–29)
Calcium: 8.9 mg/dL (ref 7.9–10.2)
Chloride: 103 mEq/L (ref 100–111)
Creatinine: 1.3 mg/dL — ABNORMAL HIGH (ref 0.6–1.0)
Globulin: 5.3 g/dL — ABNORMAL HIGH (ref 2.0–3.6)
Glucose: 88 mg/dL (ref 70–100)
Potassium: 4.1 mEq/L (ref 3.5–5.1)
Protein, Total: 7.8 g/dL (ref 6.0–8.3)
Sodium: 140 mEq/L (ref 136–145)

## 2021-07-31 LAB — URINALYSIS WITH MICROSCOPIC
Bilirubin, UA: NEGATIVE
Blood, UA: NEGATIVE
Glucose, UA: NEGATIVE
Ketones UA: NEGATIVE
Leukocyte Esterase, UA: NEGATIVE
Nitrite, UA: NEGATIVE
Specific Gravity UA: 1.015 (ref 1.001–1.035)
Urine pH: 6.5 (ref 5.0–8.0)
Urobilinogen, UA: NORMAL mg/dL (ref 0.2–2.0)

## 2021-07-31 LAB — CBC AND DIFFERENTIAL
Absolute NRBC: 0 10*3/uL (ref 0.00–0.00)
Basophils Absolute Automated: 0.02 10*3/uL (ref 0.00–0.08)
Basophils Automated: 0.3 %
Eosinophils Absolute Automated: 0.12 10*3/uL (ref 0.00–0.44)
Eosinophils Automated: 1.8 %
Hematocrit: 30.5 % — ABNORMAL LOW (ref 34.7–43.7)
Hgb: 8.6 g/dL — ABNORMAL LOW (ref 11.4–14.8)
Immature Granulocytes Absolute: 0.02 10*3/uL (ref 0.00–0.07)
Immature Granulocytes: 0.3 %
Lymphocytes Absolute Automated: 2.33 10*3/uL (ref 0.42–3.22)
Lymphocytes Automated: 34.4 %
MCH: 19.6 pg — ABNORMAL LOW (ref 25.1–33.5)
MCHC: 28.2 g/dL — ABNORMAL LOW (ref 31.5–35.8)
MCV: 69.6 fL — ABNORMAL LOW (ref 78.0–96.0)
MPV: 10 fL (ref 8.9–12.5)
Monocytes Absolute Automated: 0.54 10*3/uL (ref 0.21–0.85)
Monocytes: 8 %
Neutrophils Absolute: 3.74 10*3/uL (ref 1.10–6.33)
Neutrophils: 55.2 %
Nucleated RBC: 0 /100 WBC (ref 0.0–0.0)
Platelets: 267 10*3/uL (ref 142–346)
RBC: 4.38 10*6/uL (ref 3.90–5.10)
RDW: 20 % — ABNORMAL HIGH (ref 11–15)
WBC: 6.77 10*3/uL (ref 3.10–9.50)

## 2021-07-31 LAB — HEMOGLOBIN A1C
Average Estimated Glucose: 96.8 mg/dL
Hemoglobin A1C: 5 % (ref 4.6–5.9)

## 2021-07-31 LAB — TYPE AND SCREEN
AB Screen Gel: NEGATIVE
ABO Rh: AB POS

## 2021-07-31 LAB — PT/INR
PT INR: 1 (ref 0.9–1.1)
PT: 12.1 s (ref 10.1–12.9)

## 2021-07-31 LAB — GFR: EGFR: 39

## 2021-07-31 LAB — COLD AGGLUTININ SCREEN

## 2021-07-31 LAB — APTT: PTT: 31 s (ref 27–39)

## 2021-07-31 MED ORDER — MUPIROCIN 2 % EX OINT
TOPICAL_OINTMENT | Freq: Two times a day (BID) | CUTANEOUS | 0 refills | Status: DC
Start: 2021-07-31 — End: 2021-08-10

## 2021-07-31 NOTE — Nursing Progress Note (Signed)
Patient is here for pre-op testing at [facility].  EKG transmitted "NSR 74, LAD, RBBB, Min volt crit for LVH may be normal variant, Inferior infarc age indeterminate"  Pt denies any symptoms.  old ekg in MEDIA tab 11/29/20 similar but  with "possible lateral infarc age indet". Patient alert and cooperative voicing no complaints of chest pain.  Ronaldo Miyamoto NP notified via secure chat and advise that she will 'follow up with ekg'. Pt okay to leave [facility].

## 2021-08-01 LAB — ECG 12-LEAD
Atrial Rate: 74 {beats}/min
IHS MUSE NARRATIVE AND IMPRESSION: NORMAL
P Axis: 35 degrees
P-R Interval: 168 ms
Q-T Interval: 424 ms
QRS Duration: 130 ms
QTC Calculation (Bezet): 470 ms
R Axis: -59 degrees
T Axis: -16 degrees
Ventricular Rate: 74 {beats}/min

## 2021-08-01 LAB — COVID-19 (SARS-COV-2): SARS CoV 2 Overall Result: NOT DETECTED

## 2021-08-01 LAB — PRESURGICAL SURVEILLANCE, MSSA+MRSA

## 2021-08-02 ENCOUNTER — Ambulatory Visit: Payer: Medicare PPO

## 2021-08-02 NOTE — PSS Phone Screening (Signed)
Pre-Anesthesia Evaluation    Pre-op phone visit requested by:   Reason for pre-op phone visit: Patient anticipating TEVAR procedure.    History of Present Illness/Summary:        Problem List:  Medical Problems       Hospital Problem List  Date Reviewed: 07/31/2021   None        Non-Hospital Problem List  Date Reviewed: 07/31/2021   None       Medical History   Diagnosis Date    Atrophic kidney     Right    CAD (coronary artery disease)     Dissection of thoracic aorta     Type A dissection    Humerus fracture 05/2021    L Arm in sling s/p fall per dtr    Iron deficiency anemia     TIA (transient ischemic attack) 07/01/2001     Past Surgical History:   Procedure Laterality Date    APPENDECTOMY (OPEN)      GLAUCOMA SURGERY Bilateral     HYSTERECTOMY      MOUTH SURGERY      tonsillectomy    s/p hemiarch repair         Current Outpatient Medications:     acetaminophen-codeine (TYLENOL #2) 300-15 MG per tablet, Take 1 tablet by mouth every 4 (four) hours as needed, Disp: , Rfl:     albuterol (PROVENTIL) 2 MG tablet, Take 2 mg by mouth 3 (three) times daily, Disp: , Rfl:     albuterol sulfate HFA (PROVENTIL) 108 (90 Base) MCG/ACT inhaler, Inhale 2 puffs into the lungs every 6 (six) hours as needed for Wheezing, Disp: , Rfl:     allopurinol (ZYLOPRIM) 300 MG tablet, Take 300 mg by mouth daily, Disp: , Rfl:     ascorbic acid (VITAMIN C) 100 MG tablet, Take 100 mg by mouth daily, Disp: , Rfl:     aspirin EC 325 MG tablet, Take 325 mg by mouth daily, Disp: , Rfl:     atorvastatin (LIPITOR) 40 MG tablet, Take 40 mg by mouth daily, Disp: , Rfl:     bisoprolol (ZEBETA) 5 MG tablet, Take 2.5 mg by mouth daily, Disp: , Rfl:     cyproheptadine (PERIACTIN) 4 MG tablet, Take 4 mg by mouth 3 (three) times daily as needed, Disp: , Rfl:     digoxin (LANOXIN) 0.25 MG tablet, Take 125 mcg by mouth, Disp: , Rfl:     famotidine (PEPCID) 40 MG tablet, Take 40 mg by mouth daily, Disp: , Rfl:     ferrous sulfate 325 (65 FE) MG tablet, Take 325  mg by mouth every morning with breakfast, Disp: , Rfl:     furosemide (LASIX) 40 MG tablet, Take 40 mg by mouth daily, Disp: , Rfl:     Latanoprost 0.005 % Emulsion, Apply to eye, Disp: , Rfl:     levothyroxine (SYNTHROID) 25 MCG tablet, Take 25 mcg by mouth, Disp: , Rfl:     mupirocin (BACTROBAN) 2 % ointment, Apply topically 2 (two) times daily Apply to Both Nostrils 2x Daily for 5 Days Prior to Surgery, Disp: 22 g, Rfl: 0    potassium chloride (KLOR-CON) 10 MEQ tablet, Take 10 mEq by mouth, Disp: , Rfl:     vitamin D (CHOLECALCIFEROL) 25 MCG (1000 UT) tablet, Take 1,000 Units by mouth daily, Disp: , Rfl:      Medication List            Accurate as of August 02, 2021  2:16 PM. Always use your most recent med list.                acetaminophen-codeine 300-15 MG per tablet  Take 1 tablet by mouth every 4 (four) hours as needed  Commonly known as: TYLENOL #2  Notes to patient: PER MD     * albuterol 2 MG tablet  Take 2 mg by mouth 3 (three) times daily  Commonly known as: PROVENTIL  Notes to patient: PER MD     * albuterol sulfate HFA 108 (90 Base) MCG/ACT inhaler  Inhale 2 puffs into the lungs every 6 (six) hours as needed for Wheezing  Commonly known as: PROVENTIL  Notes to patient: PER MD     allopurinol 300 MG tablet  Take 300 mg by mouth daily  Commonly known as: ZYLOPRIM  Notes to patient: PER MD     ascorbic acid 100 MG tablet  Take 100 mg by mouth daily  Commonly known as: VITAMIN C  Notes to patient: PER MD     aspirin EC 325 MG tablet  Take 325 mg by mouth daily  Notes to patient: PER MD     atorvastatin 40 MG tablet  Take 40 mg by mouth daily  Commonly known as: LIPITOR  Notes to patient: PER MD     bisoprolol 5 MG tablet  Take 2.5 mg by mouth daily  Commonly known as: ZEBETA  Notes to patient: PER MD     cyproheptadine 4 MG tablet  Take 4 mg by mouth 3 (three) times daily as needed  Commonly known as: PERIACTIN  Notes to patient: PER MD     digoxin 0.25 MG tablet  Take 125 mcg by mouth  Commonly known as:  LANOXIN  Notes to patient: PER MD     famotidine 40 MG tablet  Take 40 mg by mouth daily  Commonly known as: PEPCID  Notes to patient: PER MD     ferrous sulfate 325 (65 FE) MG tablet  Take 325 mg by mouth every morning with breakfast  Notes to patient: PER MD     furosemide 40 MG tablet  Take 40 mg by mouth daily  Commonly known as: LASIX  Notes to patient: PER MD     Latanoprost 0.005 % Emul  Apply to eye  Notes to patient: PER MD     levothyroxine 25 MCG tablet  Take 25 mcg by mouth  Commonly known as: SYNTHROID     mupirocin 2 % ointment  Apply topically 2 (two) times daily Apply to Both Nostrils 2x Daily for 5 Days Prior to Surgery  Commonly known as: BACTROBAN  Notes to patient: PER MD     potassium chloride 10 MEQ tablet  Take 10 mEq by mouth  Commonly known as: KLOR-CON  Notes to patient: PER MD     vitamin D 25 MCG (1000 UT) tablet  Take 1,000 Units by mouth daily  Commonly known as: CHOLECALCIFEROL  Notes to patient: PER MD           * This list has 2 medication(s) that are the same as other medications prescribed for you. Read the directions carefully, and ask your doctor or other care provider to review them with you.                Allergies   Allergen Reactions    Lactobacillus      No family history on file.  Social History  Occupational History    Not on file   Tobacco Use    Smoking status: Former     Packs/day: 1.00     Years: 20.00     Pack years: 20.00     Types: Cigarettes     Quit date: 14     Years since quitting: 32.6    Smokeless tobacco: Never   Vaping Use    Vaping Use: Never used   Substance and Sexual Activity    Alcohol use: Not Currently    Drug use: Never    Sexual activity: Not on file     Day of surgery confirmed with pt as 08/06/21.  PEC pre-op instructions reviewed w/ pt. Bactroban & CHG instructions reinforced. Denies questions.  Pre-op labs, EKG, CXR, and remainder of pre-op testing requirements in epic.  Per pt, no change in history/medications since last reviewed by Ronaldo Miyamoto, FNP on 07/31/2021        Menstrual History:   LMP / Status  Postmenopausal     No LMP recorded. Patient is postmenopausal.    Tubal Ligation?  No valid surgical or medical questions entered.             Exam Scores:   SDB score Risk Category: No Risk    PONV score Nausea Risk: MODERATE RISK    MST score MST Score: 5    Allergy score Risk Category: Low Risk    Frailty score CFS Score: 5       Visit Vitals  Ht 1.626 m (5\' 4" )   Wt 50.8 kg (112 lb)   BMI 19.22 kg/m       Recent Labs   CBC (last 180 days) 07/31/21  1616   WBC 6.77   RBC 4.38   Hgb 8.6*   Hematocrit 30.5*   MCV 69.6*   MCH 19.6*   MCHC 28.2*   RDW 20*   Platelets 267   MPV 10.0   Nucleated RBC 0.0   Absolute NRBC 0.00     Recent Labs   BMP (last 180 days) 07/31/21  1616   Glucose 88   BUN 23.0*   Creatinine 1.3*   Sodium 140   Potassium 4.1   Chloride 103   CO2 28   Calcium 8.9   Anion Gap 9.0   EGFR 39.0     Recent Labs   ABG (last 180 days) 07/31/21  1616   Hgb 8.6*     Recent Labs   Coag Panel (last 180 days) 07/31/21  1616   PTT 31   PT 12.1   PT INR 1.0     Recent Labs   Other (last 180 days) 07/31/21  1616   ALT 11   AST (SGOT) 18   Protein, Total 7.8   Hemoglobin A1C 5.0     Recent Labs   Type & Screen (last 34 days) 07/31/21  1616   ABO Rh AB POS   AB Screen Gel NEG

## 2021-08-02 NOTE — Pre-Procedure Instructions (Signed)
Important Instructions Before Your Procedure        Your case is currently scheduled for 08/06/2021 at [facility] CARDIAC CATH with Alycia Rossetti, LIAM P, MUKHERJEE, DIPANKAR, SPINOSA, DAVID J.      The date and/or time of your surgery may change.  Your surgeon's office will notify you, up until the business day before surgery, if there is any change to your surgery date or time.  Please don't hesitate to call your surgeon's office directly with any questions.        IMPORTANT You must visit the Preparing for Your Procedure guide link below for additional instructions including fasting guidelines and directions before your procedure. If you received fasting or skin preparation instructions from your surgeon or pre-procedural provider, please follow those specific instructions. The instructions here are general instructions that do not pertain to all patients.  [email or web address]    If the link doesn't open, please copy and paste in your browser

## 2021-08-05 NOTE — Progress Notes (Signed)
Called via answering service. Pt did not receive instruction as to timing of surgery.  Advised patient was scheduled for 1315 and to arrive 4 hours prior to TEVAR.    Janit Pagan, PA-C  Cardiothoracic Surgery  [phone]

## 2021-08-06 ENCOUNTER — Inpatient Hospital Stay: Payer: Medicare PPO

## 2021-08-06 ENCOUNTER — Encounter
Admission: RE | Disposition: A | Discharge status: Home Health | Payer: Self-pay | Source: Home / Self Care | Attending: Surgery

## 2021-08-06 ENCOUNTER — Ambulatory Visit: Payer: Medicare PPO | Admitting: Anesthesiology

## 2021-08-06 ENCOUNTER — Inpatient Hospital Stay
Admission: RE | Admit: 2021-08-06 | Discharge: 2021-08-10 | DRG: 220 | Disposition: A | Discharge status: Home Health | Payer: Medicare PPO | Attending: Surgery | Admitting: Surgery

## 2021-08-06 ENCOUNTER — Encounter: Payer: Self-pay | Admitting: Surgery

## 2021-08-06 DIAGNOSIS — Z87891 Personal history of nicotine dependence: Secondary | ICD-10-CM

## 2021-08-06 DIAGNOSIS — Z8673 Personal history of transient ischemic attack (TIA), and cerebral infarction without residual deficits: Secondary | ICD-10-CM

## 2021-08-06 DIAGNOSIS — I7123 Aneurysm of the descending thoracic aorta, without rupture: Secondary | ICD-10-CM | POA: Diagnosis present

## 2021-08-06 DIAGNOSIS — M109 Gout, unspecified: Secondary | ICD-10-CM | POA: Diagnosis present

## 2021-08-06 DIAGNOSIS — N189 Chronic kidney disease, unspecified: Secondary | ICD-10-CM | POA: Diagnosis present

## 2021-08-06 DIAGNOSIS — I714 Abdominal aortic aneurysm, without rupture, unspecified: Secondary | ICD-10-CM | POA: Diagnosis present

## 2021-08-06 DIAGNOSIS — I7101 Dissection of thoracic aorta: Secondary | ICD-10-CM

## 2021-08-06 DIAGNOSIS — N261 Atrophy of kidney (terminal): Secondary | ICD-10-CM | POA: Diagnosis present

## 2021-08-06 DIAGNOSIS — D509 Iron deficiency anemia, unspecified: Secondary | ICD-10-CM | POA: Diagnosis present

## 2021-08-06 DIAGNOSIS — Z79890 Hormone replacement therapy (postmenopausal): Secondary | ICD-10-CM

## 2021-08-06 DIAGNOSIS — Z79899 Other long term (current) drug therapy: Secondary | ICD-10-CM

## 2021-08-06 DIAGNOSIS — I7103 Dissection of thoracoabdominal aorta: Principal | ICD-10-CM | POA: Diagnosis present

## 2021-08-06 DIAGNOSIS — I2510 Atherosclerotic heart disease of native coronary artery without angina pectoris: Secondary | ICD-10-CM | POA: Diagnosis present

## 2021-08-06 DIAGNOSIS — Z7951 Long term (current) use of inhaled steroids: Secondary | ICD-10-CM

## 2021-08-06 DIAGNOSIS — E1165 Type 2 diabetes mellitus with hyperglycemia: Secondary | ICD-10-CM | POA: Diagnosis present

## 2021-08-06 DIAGNOSIS — I71019 Dissection of thoracic aorta, unspecified: Secondary | ICD-10-CM

## 2021-08-06 DIAGNOSIS — I129 Hypertensive chronic kidney disease with stage 1 through stage 4 chronic kidney disease, or unspecified chronic kidney disease: Secondary | ICD-10-CM | POA: Diagnosis present

## 2021-08-06 DIAGNOSIS — E1122 Type 2 diabetes mellitus with diabetic chronic kidney disease: Secondary | ICD-10-CM | POA: Diagnosis present

## 2021-08-06 DIAGNOSIS — D62 Acute posthemorrhagic anemia: Secondary | ICD-10-CM | POA: Diagnosis not present

## 2021-08-06 DIAGNOSIS — I97638 Postprocedural hematoma of a circulatory system organ or structure following other circulatory system procedure: Secondary | ICD-10-CM | POA: Diagnosis not present

## 2021-08-06 DIAGNOSIS — Z01810 Encounter for preprocedural cardiovascular examination: Secondary | ICD-10-CM

## 2021-08-06 DIAGNOSIS — E039 Hypothyroidism, unspecified: Secondary | ICD-10-CM | POA: Diagnosis present

## 2021-08-06 DIAGNOSIS — F0390 Unspecified dementia without behavioral disturbance: Secondary | ICD-10-CM | POA: Diagnosis present

## 2021-08-06 HISTORY — PX: THORACIC AORTIC STENT GRAFT (TAA): IMG2649

## 2021-08-06 LAB — CVOR1 AGNKC GL THGB CLWBL
Arterial Total CO2: 22 mEq/L — ABNORMAL LOW (ref 24.0–30.0)
Base Excess, Arterial: -3.1 mEq/L — ABNORMAL LOW (ref <=2.0)
Calcium, Ionized: 2.33 mEq/L (ref 2.30–2.58)
Chloride, WB: 108 mEq/L — ABNORMAL HIGH (ref 98–106)
HCO3, Arterial: 20.9 mEq/L — ABNORMAL LOW (ref 23.0–29.0)
Hematocrit Total Calculated: 24.3 % — ABNORMAL LOW (ref 37.0–47.0)
Hemoglobin Total: 7.8 g/dL — ABNORMAL LOW (ref 12.0–16.0)
O2 Sat, Arterial: 99.6 % (ref 95.0–100.0)
Temperature: 37
Whole Blood Glucose: 163 mg/dL — ABNORMAL HIGH (ref 70–100)
Whole Blood Potassium: 3.1 mEq/L — ABNORMAL LOW (ref 3.5–5.3)
Whole Blood Sodium: 142 mEq/L (ref 136–146)
pCO2, Arterial: 35 mmhg (ref 35.0–45.0)
pH, Arterial: 7.393 (ref 7.350–7.450)
pO2, Arterial: 209 mmhg — ABNORMAL HIGH (ref 80.0–90.0)

## 2021-08-06 LAB — CBC
Absolute NRBC: 0 10*3/uL (ref 0.00–0.00)
Hematocrit: 25.1 % — ABNORMAL LOW (ref 34.7–43.7)
Hgb: 7.3 g/dL — ABNORMAL LOW (ref 11.4–14.8)
MCH: 19.7 pg — ABNORMAL LOW (ref 25.1–33.5)
MCHC: 29.1 g/dL — ABNORMAL LOW (ref 31.5–35.8)
MCV: 67.7 fL — ABNORMAL LOW (ref 78.0–96.0)
Nucleated RBC: 0 /100 WBC (ref 0.0–0.0)
Platelets: 141 10*3/uL — ABNORMAL LOW (ref 142–346)
RBC: 3.71 10*6/uL — ABNORMAL LOW (ref 3.90–5.10)
RDW: 19 % — ABNORMAL HIGH (ref 11–15)
WBC: 9.81 10*3/uL — ABNORMAL HIGH (ref 3.10–9.50)

## 2021-08-06 LAB — RED BLOOD CELLS OR HOLD
Expiration Date: 202209152359
Expiration Date: 202209152359
ISBT CODE: 8400
ISBT CODE: 8400
UTYPE: AB POS
UTYPE: AB POS

## 2021-08-06 LAB — I-STAT ACT KAOLIN: i-STAT ACT Kaolin: 208 s — ABNORMAL HIGH (ref 74–147)

## 2021-08-06 LAB — MAGNESIUM: Magnesium: 1.8 mg/dL (ref 1.6–2.6)

## 2021-08-06 LAB — BLOOD GAS, ARTERIAL
Arterial Total CO2: 25.6 mEq/L (ref 24.0–30.0)
Arterial Total CO2: 24.9 mEq/L (ref 24.0–30.0)
Base Excess, Arterial: 0.6 mEq/L (ref <=2.0)
Base Excess, Arterial: -0.9 mEq/L (ref <=2.0)
HCO3, Arterial: 24.4 mEq/L (ref 23.0–29.0)
HCO3, Arterial: 23.6 mEq/L (ref 23.0–29.0)
O2 Flow: 2 L/min
O2 Sat, Arterial: 99.3 % (ref 95.0–100.0)
O2 Sat, Arterial: 99.7 % (ref 95.0–100.0)
Temperature: 36.5
Temperature: 35.7
pCO2, Arterial: 37.2 mmhg (ref 35.0–45.0)
pCO2, Arterial: 38.6 mmhg (ref 35.0–45.0)
pH, Arterial: 7.43 (ref 7.350–7.450)
pH, Arterial: 7.396 (ref 7.350–7.450)
pO2, Arterial: 157 mmhg — ABNORMAL HIGH (ref 80.0–90.0)
pO2, Arterial: 172 mmhg — ABNORMAL HIGH (ref 80.0–90.0)

## 2021-08-06 LAB — PT/INR
PT INR: 1.2 — ABNORMAL HIGH (ref 0.9–1.1)
PT: 14.2 s — ABNORMAL HIGH (ref 10.1–12.9)

## 2021-08-06 LAB — BASIC METABOLIC PANEL
Anion Gap: 7 (ref 5.0–15.0)
BUN: 18 mg/dL (ref 7.0–19.0)
CO2: 26 mEq/L (ref 22–29)
Calcium: 8.3 mg/dL (ref 7.9–10.2)
Chloride: 105 mEq/L (ref 100–111)
Creatinine: 1 mg/dL (ref 0.6–1.0)
Glucose: 116 mg/dL — ABNORMAL HIGH (ref 70–100)
Potassium: 3.9 mEq/L (ref 3.5–5.1)
Sodium: 138 mEq/L (ref 136–145)

## 2021-08-06 LAB — APTT: PTT: 33 s (ref 27–39)

## 2021-08-06 LAB — GFR: EGFR: 52.8

## 2021-08-06 LAB — BLOOD TYPE CONFIRMATION: Blood Type Confirmation: AB POS

## 2021-08-06 SURGERY — THORACIC AORTIC STENT GRAFT (TAA)
Anesthesia: Anesthesia General

## 2021-08-06 MED ORDER — FENTANYL CITRATE (PF) 50 MCG/ML IJ SOLN (WRAP)
INTRAMUSCULAR | Status: AC
Start: 2021-08-06 — End: ?
  Filled 2021-08-06: qty 2

## 2021-08-06 MED ORDER — LACTATED RINGERS IV SOLN
INTRAVENOUS | Status: DC | PRN
Start: 2021-08-06 — End: 2021-08-06

## 2021-08-06 MED ORDER — LIDOCAINE HCL 1 % IJ SOLN
INTRAMUSCULAR | Status: AC
Start: 2021-08-06 — End: ?
  Filled 2021-08-06: qty 20

## 2021-08-06 MED ORDER — MAGNESIUM SULFATE IN D5W 1-5 GM/100ML-% IV SOLN
1.0000 g | Freq: Once | INTRAVENOUS | Status: AC
Start: 2021-08-06 — End: 2021-08-06
  Administered 2021-08-06: 1 g via INTRAVENOUS
  Filled 2021-08-06: qty 100

## 2021-08-06 MED ORDER — MUPIROCIN CALCIUM 2 % NA OINT
TOPICAL_OINTMENT | Freq: Once | NASAL | Status: AC
Start: 2021-08-06 — End: 2021-08-06
  Administered 2021-08-06: 12:00:00 0.9 via NASAL
  Filled 2021-08-06: qty 0.9

## 2021-08-06 MED ORDER — ROCURONIUM BROMIDE 50 MG/5ML IV SOLN
INTRAVENOUS | Status: AC
Start: 2021-08-06 — End: ?
  Filled 2021-08-06: qty 5

## 2021-08-06 MED ORDER — SODIUM CHLORIDE 0.9 % IV MBP
1.5000 g | Freq: Three times a day (TID) | INTRAVENOUS | Status: AC
Start: 2021-08-06 — End: 2021-08-07
  Administered 2021-08-06 – 2021-08-07 (×3): 1.5 g via INTRAVENOUS
  Filled 2021-08-06 (×3): qty 1500

## 2021-08-06 MED ORDER — LACTATED RINGERS IV SOLN
INTRAVENOUS | Status: DC
Start: 2021-08-06 — End: 2021-08-06

## 2021-08-06 MED ORDER — LACTATED RINGERS IV BOLUS
250.0000 mL | INTRAVENOUS | Status: DC | PRN
Start: 2021-08-06 — End: 2021-08-07

## 2021-08-06 MED ORDER — SENNOSIDES-DOCUSATE SODIUM 8.6-50 MG PO TABS
1.0000 | ORAL_TABLET | Freq: Every evening | ORAL | Status: DC
Start: 2021-08-06 — End: 2021-08-10
  Administered 2021-08-07 – 2021-08-09 (×3): 1 via ORAL
  Filled 2021-08-06 (×3): qty 1

## 2021-08-06 MED ORDER — ASPIRIN 81 MG PO TBEC
81.0000 mg | DELAYED_RELEASE_TABLET | Freq: Every day | ORAL | Status: DC
Start: 2021-08-07 — End: 2021-08-10
  Administered 2021-08-07 – 2021-08-10 (×3): 81 mg via ORAL
  Filled 2021-08-06 (×4): qty 1

## 2021-08-06 MED ORDER — TRAMADOL HCL 50 MG PO TABS
50.0000 mg | ORAL_TABLET | ORAL | Status: DC | PRN
Start: 2021-08-06 — End: 2021-08-10
  Administered 2021-08-08: 50 mg via ORAL
  Filled 2021-08-06: qty 1

## 2021-08-06 MED ORDER — VANCOMYCIN HCL IN NACL 1-0.9 GM/250ML-% IV SOLN
1000.0000 mg | INTRAVENOUS | Status: AC
Start: 2021-08-06 — End: 2021-08-06
  Administered 2021-08-06: 1000 mg via INTRAVENOUS
  Filled 2021-08-06: qty 1000

## 2021-08-06 MED ORDER — EPINEPHRINE-DEXTROSE 100-5 MCG/10ML-% IV SOSY
PREFILLED_SYRINGE | INTRAVENOUS | Status: DC | PRN
Start: 2021-08-06 — End: 2021-08-06
  Administered 2021-08-06: 2.5 ug via INTRAVENOUS
  Administered 2021-08-06: 5 ug via INTRAVENOUS
  Administered 2021-08-06 (×4): 2.5 ug via INTRAVENOUS

## 2021-08-06 MED ORDER — CHLORHEXIDINE GLUCONATE 0.12 % MT SOLN
15.0000 mL | Freq: Two times a day (BID) | OROMUCOSAL | Status: DC
Start: 2021-08-06 — End: 2021-08-07
  Administered 2021-08-06 – 2021-08-07 (×3): 15 mL via OROMUCOSAL
  Filled 2021-08-06 (×3): qty 15

## 2021-08-06 MED ORDER — SODIUM CHLORIDE 0.9 % IV SOLN
0.0000 mg/min | INTRAVENOUS | Status: DC | PRN
Start: 2021-08-06 — End: 2021-08-07
  Filled 2021-08-06: qty 100

## 2021-08-06 MED ORDER — ONDANSETRON HCL 4 MG/2ML IJ SOLN
INTRAMUSCULAR | Status: DC | PRN
Start: 2021-08-06 — End: 2021-08-06
  Administered 2021-08-06: 4 mg via INTRAVENOUS

## 2021-08-06 MED ORDER — NOREPINEPHRINE-DEXTROSE 8-5 MG/250ML-% IV SOLN (IHS)
1.0000 ug/min | INTRAVENOUS | Status: DC | PRN
Start: 2021-08-06 — End: 2021-08-07

## 2021-08-06 MED ORDER — ETOMIDATE 2 MG/ML IV SOLN
INTRAVENOUS | Status: AC
Start: 2021-08-06 — End: ?
  Filled 2021-08-06: qty 20

## 2021-08-06 MED ORDER — FENTANYL CITRATE (PF) 50 MCG/ML IJ SOLN (WRAP)
INTRAMUSCULAR | Status: DC | PRN
Start: 2021-08-06 — End: 2021-08-06
  Administered 2021-08-06: 100 ug via INTRAVENOUS

## 2021-08-06 MED ORDER — ETOMIDATE 2 MG/ML IV SOLN
INTRAVENOUS | Status: DC | PRN
Start: 2021-08-06 — End: 2021-08-06
  Administered 2021-08-06: 8 mg via INTRAVENOUS

## 2021-08-06 MED ORDER — HEPARIN (PORCINE) IN NACL 2-0.9 UNIT/ML-% IJ SOLN (WRAP)
INTRAVENOUS | Status: AC
Start: 2021-08-06 — End: ?
  Filled 2021-08-06: qty 1000

## 2021-08-06 MED ORDER — SODIUM CHLORIDE 0.9 % IV MBP
0.0000 mg/h | INTRAVENOUS | Status: DC | PRN
Start: 2021-08-06 — End: 2021-08-07

## 2021-08-06 MED ORDER — PROPOFOL 10 MG/ML IV EMUL (WRAP)
INTRAVENOUS | Status: AC
Start: 2021-08-06 — End: ?
  Filled 2021-08-06: qty 40

## 2021-08-06 MED ORDER — ACETAMINOPHEN 325 MG PO TABS
650.0000 mg | ORAL_TABLET | ORAL | Status: DC | PRN
Start: 2021-08-06 — End: 2021-08-10
  Administered 2021-08-09 – 2021-08-10 (×2): 650 mg via ORAL
  Filled 2021-08-06 (×2): qty 2

## 2021-08-06 MED ORDER — FAMOTIDINE 20 MG PO TABS
20.0000 mg | ORAL_TABLET | Freq: Every day | ORAL | Status: DC
Start: 2021-08-06 — End: 2021-08-10
  Administered 2021-08-06 – 2021-08-10 (×5): 20 mg via ORAL
  Filled 2021-08-06 (×5): qty 1

## 2021-08-06 MED ORDER — ONDANSETRON 4 MG PO TBDP
4.0000 mg | ORAL_TABLET | Freq: Three times a day (TID) | ORAL | Status: DC | PRN
Start: 2021-08-06 — End: 2021-08-10

## 2021-08-06 MED ORDER — ONDANSETRON HCL 4 MG/2ML IJ SOLN
4.0000 mg | Freq: Three times a day (TID) | INTRAMUSCULAR | Status: DC | PRN
Start: 2021-08-06 — End: 2021-08-10

## 2021-08-06 MED ORDER — ACETAMINOPHEN 500 MG PO TABS
1000.0000 mg | ORAL_TABLET | ORAL | Status: AC
Start: 2021-08-06 — End: 2021-08-06
  Administered 2021-08-06: 12:00:00 1000 mg via ORAL
  Filled 2021-08-06: qty 2

## 2021-08-06 MED ORDER — DEXAMETHASONE SODIUM PHOSPHATE 20 MG/5ML IJ SOLN
INTRAMUSCULAR | Status: AC
Start: 2021-08-06 — End: ?
  Filled 2021-08-06: qty 5

## 2021-08-06 MED ORDER — IODIXANOL 320 MG/ML IV SOLN
43.0000 mL | Freq: Once | INTRAVENOUS | Status: AC | PRN
Start: 2021-08-06 — End: 2021-08-06
  Administered 2021-08-06: 16:00:00 43 mL via INTRA_ARTERIAL

## 2021-08-06 MED ORDER — FENTANYL CITRATE (PF) 50 MCG/ML IJ SOLN (WRAP)
25.0000 ug | INTRAMUSCULAR | Status: DC | PRN
Start: 2021-08-06 — End: 2021-08-06

## 2021-08-06 MED ORDER — ALBUMIN HUMAN/BIOSIMILIAR 5% IV SOLN (WRAP)
INTRAVENOUS | Status: DC | PRN
Start: 2021-08-06 — End: 2021-08-06

## 2021-08-06 MED ORDER — HEPARIN SODIUM (PORCINE) 1000 UNIT/ML IJ SOLN
INTRAMUSCULAR | Status: DC | PRN
Start: 2021-08-06 — End: 2021-08-06
  Administered 2021-08-06: 5000 [IU] via INTRAVENOUS

## 2021-08-06 MED ORDER — HYDROMORPHONE HCL 0.5 MG/0.5 ML IJ SOLN
0.5000 mg | INTRAMUSCULAR | Status: DC | PRN
Start: 2021-08-06 — End: 2021-08-06

## 2021-08-06 MED ORDER — PROMETHAZINE HCL 25 MG/ML IJ SOLN
6.2500 mg | Freq: Once | INTRAMUSCULAR | Status: DC | PRN
Start: 2021-08-06 — End: 2021-08-06

## 2021-08-06 MED ORDER — OXYCODONE HCL 5 MG PO TABS
5.0000 mg | ORAL_TABLET | Freq: Once | ORAL | Status: DC | PRN
Start: 2021-08-06 — End: 2021-08-06

## 2021-08-06 MED ORDER — MUPIROCIN CALCIUM 2 % NA OINT
TOPICAL_OINTMENT | Freq: Two times a day (BID) | NASAL | Status: DC
Start: 2021-08-06 — End: 2021-08-10
  Administered 2021-08-06 – 2021-08-10 (×8): 0.9 via NASAL
  Filled 2021-08-06 (×8): qty 0.9

## 2021-08-06 MED ORDER — PROTAMINE SULFATE 10 MG/ML IV SOLN
INTRAVENOUS | Status: DC | PRN
Start: 2021-08-06 — End: 2021-08-06
  Administered 2021-08-06: 20 mg via INTRAVENOUS
  Administered 2021-08-06: 50 mg via INTRAVENOUS

## 2021-08-06 MED ORDER — ONDANSETRON HCL 4 MG/2ML IJ SOLN
4.0000 mg | Freq: Once | INTRAMUSCULAR | Status: DC | PRN
Start: 2021-08-06 — End: 2021-08-06

## 2021-08-06 MED ORDER — HEPARIN (PORCINE) IN NACL 2-0.9 UNIT/ML-% IJ SOLN (WRAP)
INTRAVENOUS | Status: AC
Start: 2021-08-06 — End: ?
  Filled 2021-08-06: qty 3000

## 2021-08-06 MED ORDER — PROTAMINE SULFATE 10 MG/ML IV SOLN
INTRAVENOUS | Status: AC
Start: 2021-08-06 — End: ?
  Filled 2021-08-06: qty 5

## 2021-08-06 MED ORDER — DEXMEDETOMIDINE HCL IN NACL 400 MCG/100ML IV SOLN
0.1000 ug/kg/h | INTRAVENOUS | Status: DC
Start: 2021-08-06 — End: 2021-08-07

## 2021-08-06 MED ORDER — EPINEPHRINE HCL 1 MG/ML IJ SOLN (WRAP)
INTRAVENOUS | Status: DC | PRN
Start: 2021-08-06 — End: 2021-08-06
  Administered 2021-08-06: 15:00:00 2 ug/min via INTRAVENOUS

## 2021-08-06 MED ORDER — ONDANSETRON HCL 4 MG/2ML IJ SOLN
INTRAMUSCULAR | Status: AC
Start: 2021-08-06 — End: ?
  Filled 2021-08-06: qty 2

## 2021-08-06 MED ORDER — NOREPINEPHRINE BITARTRATE 1 MG/ML IV SOLN
INTRAVENOUS | Status: DC | PRN
Start: 2021-08-06 — End: 2021-08-06
  Administered 2021-08-06: 15:00:00 6 ug/min via INTRAVENOUS

## 2021-08-06 MED ORDER — LIDOCAINE HCL (PF) 2 % IJ SOLN
INTRAMUSCULAR | Status: AC
Start: 2021-08-06 — End: ?
  Filled 2021-08-06: qty 5

## 2021-08-06 MED ORDER — SUGAMMADEX SODIUM 200 MG/2ML IV SOLN
INTRAVENOUS | Status: DC | PRN
Start: 2021-08-06 — End: 2021-08-06
  Administered 2021-08-06: 200 mg via INTRAVENOUS

## 2021-08-06 MED ORDER — POLYETHYLENE GLYCOL 3350 17 G PO PACK
17.0000 g | PACK | Freq: Every day | ORAL | Status: DC
Start: 2021-08-06 — End: 2021-08-10
  Administered 2021-08-07 – 2021-08-09 (×3): 17 g via ORAL
  Filled 2021-08-06 (×3): qty 1

## 2021-08-06 MED ORDER — GABAPENTIN 300 MG PO CAPS
300.0000 mg | ORAL_CAPSULE | ORAL | Status: AC
Start: 2021-08-06 — End: 2021-08-06
  Administered 2021-08-06: 12:00:00 300 mg via ORAL
  Filled 2021-08-06: qty 1

## 2021-08-06 MED ORDER — VANCOMYCIN HCL 1 G IV SOLR
INTRAVENOUS | Status: DC | PRN
Start: 2021-08-06 — End: 2021-08-06
  Administered 2021-08-06: 1000 mg via INTRAVENOUS

## 2021-08-06 MED ORDER — PROPOFOL 10 MG/ML IV EMUL (WRAP)
INTRAVENOUS | Status: DC | PRN
Start: 2021-08-06 — End: 2021-08-06
  Administered 2021-08-06: 30 mg via INTRAVENOUS

## 2021-08-06 MED ORDER — LIDOCAINE HCL 1 % IJ SOLN
1.0000 mL | Freq: Once | INTRAMUSCULAR | Status: DC | PRN
Start: 2021-08-06 — End: 2021-08-06

## 2021-08-06 MED ORDER — BISACODYL 10 MG RE SUPP
10.0000 mg | Freq: Every day | RECTAL | Status: DC | PRN
Start: 2021-08-06 — End: 2021-08-10

## 2021-08-06 MED ORDER — DEXAMETHASONE SODIUM PHOSPHATE 4 MG/ML IJ SOLN (WRAP)
INTRAMUSCULAR | Status: DC | PRN
Start: 2021-08-06 — End: 2021-08-06
  Administered 2021-08-06: 4 mg via INTRAVENOUS

## 2021-08-06 MED ORDER — LIDOCAINE HCL 2 % IJ SOLN
INTRAMUSCULAR | Status: DC | PRN
Start: 2021-08-06 — End: 2021-08-06
  Administered 2021-08-06: 50 mg

## 2021-08-06 MED ORDER — LIDOCAINE 4 % EX CREA
TOPICAL_CREAM | Freq: Once | CUTANEOUS | Status: DC | PRN
Start: 2021-08-06 — End: 2021-08-06

## 2021-08-06 MED ORDER — SODIUM CHLORIDE 0.9 % IV SOLN
INTRAVENOUS | Status: DC
Start: 2021-08-06 — End: 2021-08-07
  Administered 2021-08-06: 85 mL/h via INTRAVENOUS

## 2021-08-06 MED ORDER — ACETAMINOPHEN 325 MG PO TABS
650.0000 mg | ORAL_TABLET | Freq: Once | ORAL | Status: DC | PRN
Start: 2021-08-06 — End: 2021-08-06

## 2021-08-06 SURGICAL SUPPLY — 54 items
18F MANTA VASCULAR CLOSURE DEVICE, 8F PUNCTURE LOCATOR, 18F SHEATH INTRODUCER, 18F SHEATH (Closure Device) ×2 IMPLANT
42MM X 42MM X 100MM, VALIANT THORACIC STENT GRAFT W/ CAPTIVA DELIVERY SYSTEM, FREEFLO, PROXIMAL COMPONENT (Stent Graft) ×2 IMPLANT
42MM X 42MM X 150MM, VALIANT THORACIC STENT GRAFT W/ CAPTIVA DELIVERY SYSTEM, FREEFLO, PROXIMAL COMPONENT (Stent Graft) ×2 IMPLANT
CATHETER ANGIO ANG BRNSTN CRV SFVU 5FR (Catheter Micellaneous) ×4 IMPLANT
CATHETER ANGIO PGTL CRV ACVU 5FR 100CM (Catheter Micellaneous) ×1
CATHETER OD2.1 MM L65 CM BRAID BERENSTEIN SOFT-VU ANGIOGRAPHIC (Catheter Miscellaneous) ×2 IMPLANT
CATHETER OD5 FR L100 CM RADIOPAQUE FLUSH (Catheter Micellaneous) ×1 IMPLANT
CATHETER OD5 FR L100 CM RADIOPAQUE FLUSH SEGMENT SIZING PIGTAIL CURVE (Catheter Miscellaneous) ×1 IMPLANT
CATHETER ULTRASOUND .035 IN OD8.5 FR L90 (Catheter) ×1 IMPLANT
CATHETER ULTRASOUND .035 IN OD8.5 FR L90 CM L100 CM INTRAVASCULAR (Catheter) ×1 IMPLANT
CATHETER US GLYDX .035IN VSN PV 8.5FR 90 (Catheter) ×1
CATHETER, TORCON NB ADV ANGIO CATH (HNB5.0-38-65-P-NS-RC1) (EA) (Catheter, Diagnostic Imaging - Angiographic) ×2 IMPLANT
DEVICE CLOSR PERCLS PRSTYL SUTR MEDT RPR (Vascular Clsr Devices) ×1
DEVICE CLOSURE PERCLOSE PROSTYLE SUTURE (Vascular Clsr Devices) ×1 IMPLANT
DEVICE CLOSURE PERCLOSE PROSTYLE SUTURE MEDIATE REPAIR (Vascular Clsr Devices) ×1 IMPLANT
DILATOR VASC STD JCD CRV 10FR 20CM STRL (Procedure Accessories) ×1
DILATOR VASC STD JCD CRV 8FR 20CM STRL (Procedure Accessories) ×1
DILATOR VASCULAR OD10 FR L20 CM (Procedure Accessories) ×1 IMPLANT
DILATOR VASCULAR OD10 FR L20 CM COOK RADIOPAQUE VESSEL STANDARD JCD (Procedure Accessories) ×1 IMPLANT
DILATOR VASCULAR OD8 FR L20 CM (Procedure Accessories) ×1 IMPLANT
DILATOR VASCULAR OD8 FR L20 CM COOK RADIOPAQUE VESSEL STANDARD JCD (Procedure Accessories) ×1 IMPLANT
GRAFT STENT OD38 MM ODSEC24 FR L100 MM L107 MM SINUSOIDAL STRAIGHT (Graft) ×1 IMPLANT
GRAFT STENT OD40 MM ODSEC25 FR L100 MM L112 MM SINUSOIDAL STRAIGHT (Graft) ×1 IMPLANT
GRAFT STENT OD42 MM ODSEC25 FR L150 MM L157 MM SINUSOIDAL STRAIGHT (Graft) ×1 IMPLANT
GRAFT STENT OD42-38 MM ODSEC24 FR L112 MM L100 MM SINUSOIDAL TAPER (Graft) ×1 IMPLANT
GRAFT STNT HDRPH STRG TPR SINUSOIDAL (Graft) ×1 IMPLANT
GRAFT STNT NTNL HDRPH SINUSOIDAL STRG (Graft) ×2 IMPLANT
GRAFT STNT NTNL PLTN IR HDRPH SINUSOIDAL (Graft) ×2 IMPLANT
GRAFT STNT OD40 MM ODSEC25 FR L100 MM (Graft) ×2 IMPLANT
GRAFT STNT OD42-38 MM ODSEC24 FR L112 MM (Graft) ×1 IMPLANT
GUIDEWIRE ENDOSCOPIC OD.035 IN L260 CM (Guidwire) ×1 IMPLANT
GUIDEWIRE ENDOSCOPIC ROSEN OD.035 IN L260 CM 1.5 MM RADIUS TAPER (Guidwire) ×1 IMPLANT
GUIDEWIRE ESCP SS PTFE 1.5MM RDS TPR RSN (Guidwire) ×1
GUIDEWIRE VASC NTNL HDRPH ANG STD GLDWR (Guidwire) ×1
GUIDEWIRE VASC PTFE STRG TPR STD STRT (Guidwire) ×1
GUIDEWIRE VASC SS PTFE 75/15 RDS 2 CRV (Guidwire) ×2
GUIDEWIRE VASC SS PTFE BNTSN + TPR 6CM (Guidwire) ×2
GUIDEWIRE VASCULAR OD.035 IN L145 CM L15 (Guidwire) ×2 IMPLANT
GUIDEWIRE VASCULAR OD.035 IN L145 CM L15 CM BENTSON PLUS BENTSON PLUS (Guidwire) ×2 IMPLANT
GUIDEWIRE VASCULAR OD.035 IN L180 CM L3 (Guidwire) ×1 IMPLANT
GUIDEWIRE VASCULAR OD.035 IN L180 CM L3 CM GLIDEWIRE ANGLE STANDARD (Guidwire) ×1 IMPLANT
GUIDEWIRE VASCULAR OD.035 IN L260 CM L11 (Guidwire) ×2 IMPLANT
GUIDEWIRE VASCULAR OD.035 IN L260 CM L11 CM LUNDERQUIST DOUBLE CURVED (Guidwire) ×2 IMPLANT
GUIDEWIRE VASCULAR OD.035 IN L260 CM L15 (Guidwire) ×1 IMPLANT
GUIDEWIRE VASCULAR OD.035 IN L260 CM L15 CM STARTER BENTSON STRAIGHT (Guidwire) ×1 IMPLANT
SHEATH GD PNCL 11FR 10CM SNPON DIL LCK (Introducer) ×1
SHEATH GD SS NYL HDRPH PTFE STRG PNCL (Catheter) ×1
SHEATH GUIDING L10 CM SNAP ON DILATOR (Introducer) ×1 IMPLANT
SHEATH GUIDING L10 CM SNAP ON DILATOR LOCK KINK RESISTANT SMOOTH (Introducer) ×1 IMPLANT
SHEATH GUIDING L90 CM STRAIGHT (Catheter) ×1 IMPLANT
SHEATH GUIDING L90 CM STRAIGHT RADIOPAQUE KINK RESISTANT CROSS CUT (Catheter) ×1 IMPLANT
SHEATH INTRO PNCL R/O II 6FR 10CM RADOPQ (Introducer) ×2
SHEATH INTRODUCER L10 CM RADIOPAQUE (Introducer) ×2 IMPLANT
SHEATH INTRODUCER L10 CM RADIOPAQUE DILATOR KINK RESISTANT SMOOTH (Introducer) ×2 IMPLANT

## 2021-08-06 NOTE — Transfer of Care (Addendum)
Anesthesia Transfer of Care Note    Patient: Jessica [name]    Procedures performed: Procedure(s):  TEVAR    Anesthesia type: General ETT    Patient location:[name]    Last vitals:   Hr 73, bp 139/62, rr 18, spo2 97%, t 36.5C  Post pain: Patient not complaining of pain, continue current therapy      Mental Status:Emerging    Respiratory Function: tolerating face mask    Cardiovascular: stable    Nausea/Vomiting: patient not complaining of nausea or vomiting    Hydration Status: adequate    Post assessment: no apparent anesthetic complications    Signed by: Raquel James, MD  08/06/21 5:08 PM

## 2021-08-06 NOTE — Sedation Documentation (Signed)
Procedure completed without complication by Dr. Arbie Cookey, Dr. Suszanne Finch, and Dr. Alycia Rossetti.Patient tolerated procedure well. No complaints of pain at this time. VSS. No acute distress observed. Report called to RN.Viviann Spare post procedure via bed with anesthesia, and nurse to CVICU 206. Patient

## 2021-08-06 NOTE — Sedation Documentation (Signed)
In lab, pre anesthesia pause, a line on stretcher, left arm fracture, in sling

## 2021-08-06 NOTE — Progress Notes (Signed)
Outpatient to [facility] room 11 for TEVAR with Dr. Alycia Rossetti. ID   band, NPO status, and allergies verified. VSS. Pt AOX4. Denies pain at this   time. Patient daughter at bedside. PIV inserted/flushed. Procedure and recovery expectation   explained to patient and her daughter at bedside. Call bell within reach; will continue   to monitor care and maintain safety.

## 2021-08-06 NOTE — Brief Op Note (Signed)
BRIEF IR PROCEDURE NOTE    Date Time: 08/06/21 4:00 PM    Patient Name:   Jessica [name]    Date of Operation:   08/06/2021    Providers Performing:   Surgeon(s):  Halford Decamp, MD  Sandrea Hughs, MD  Otilio Miu, MD    Assistant (s):    Operative Procedure:   Procedure(s):  TEVAR    Preoperative Diagnosis:   Pre-Op Diagnosis Codes:     * TIA (transient ischemic attack) [G45.9]    Postoperative Diagnosis:   * No post-op diagnosis entered *    Anesthesia:   (  ) FENTANYL  (  ) VERSED  (  x) LOCAL  (x  ) GENERAL ANESTHESIA (DEPT OF ANESTHESIOLOGY) )    Estimated Blood Loss:         CONTRAST   CO2; [id number]    RADIATION DOSE    28. FLUORO TIME  471m  Gy    Findings:   SUCCESSFUL PLACEMENT OF TEVAR FROM ORIGIN OF LEFT SUBCLAVIAN ARTERY TO JUST ABOVE CELIAC ARTERY.  17F MANTA LT CFA; 37F PROGLIDE RT CFA    Complications:   NONE      Signed by: Otilio Miu, MD, MD                                                                              [facility] CARDIAC CATH

## 2021-08-06 NOTE — Progress Notes (Signed)
Patient: Jessica [name], Jessica [name]   Date: 08/06/2021  LOS: 0  Attending: Halford Decamp, MD  APP Crawford Givens    Assumed care of patient at: 1645    Review of Systems    Neuro/Activity: drowsy but follow commands and oriented x3    CV: SR    Respiratory: 2L/min NC    GI: active BS    GU: yellow urine with foley.    Skin: intact.    ID:     Dispo/Plan:   Received pt from cath lab with spinal drain, draining clear spinal fluids. Bilateral groins intact and soft to touch.  Pulses palpable. Moves all extremities. Updated pt's status to family members. No complaints voiced. Continue to monitor.

## 2021-08-06 NOTE — Nursing Progress Note (Signed)
Patient taken to CVICU 206 and Handoff report given to nurse Nena. Puncture site checked and distal pulses checked with the nurse Nena, and no signs of bleeding and distal pulses palpable.

## 2021-08-06 NOTE — Anesthesia Procedure Notes (Signed)
Spinal Drain      Patient location during procedure: OR      Block at [employer] request: Yes          Staffing  Performed: anesthesiologist   Anesthesiologist: Meryl Crutch, MD      Pre-procedure Checklist   Completed: patient identified, surgical consent, pre-op evaluation, timeout performed, risks and benefits discussed, monitors and equipment checked, anesthesia consent given and correct site      Spinal  Patient monitoring: pulse oximetry, NIBP and face mask O2    Premedication: No    Patient position: sitting    Sterile Technique: chlorhexidine gluconate and isopropyl alcohol  Skin Local: lidocaine 2%        Approach: midline  Number of attempts: 1      Needle Placement  Needle type: Other (tuohy)   Epidural needle gauge: 14.      Intrathecal space entered at L3-4        Paresthesia Pain: no    Catheter Placement   Catheter type: side hole  Catheter size: 19 G  CSF Return: Yes  Blood Return: No

## 2021-08-06 NOTE — Anesthesia Preprocedure Evaluation (Addendum)
Anesthesia Evaluation    AIRWAY    Mallampati: II    TM distance: >3 FB  Neck ROM: full  Mouth Opening:full  Planned to use difficult airway equipment: No CARDIOVASCULAR    regular and murmur       DENTAL        (+) upper dentures   PULMONARY    pulmonary exam normal     OTHER FINDINGS                 86 y.o. female with history of TIA (20 years ago, no residual effect), CKD3 (1 functioning kidney), anemia with prior hx of iron infusions, DM2, HLD, restrictive lung disease and type A aortic dissection s/p hemiarch repair in September of 2021 (in [place]) with residual filling of false lumen on CT.    MRI brain wo contrast 04/23/21:  [facility]) advanced microvascular ischemic changes, chronic lacunar infarcts, remote microhemorrhages likely sequela of chronic hypertensive vasculopathy.  Small area of encephalomalacia of left inferolateral occipital lobe, likely remote inferior MCA-PCA border zone infarct.  Mild associated hemosiderin deposition.    CTA c/a/p w contrast 04/15/21:  [facility]) moderate LM, LAD, RCA coronary artery calcifications.  No AV calcification.  Asc thora ao graft.  Normal filling into great vessels.  Interval increase in size of desc thora ao dissection aneurysm.  Arch 5.1cm.  Distal thoracic aorta 5.4cm.  interval significant increase in size of entrance fenestration at ao arch.  Interval well defined likely mult new fenestrations I mid and dis thora ao aneurysm.  Similar appearance of dissection extends distally into desc thora and abd ao with short extension into right common iliac.  Secondary filling of false lumen with contrast via fenestrations at level of celiac axis as well as level of IMA.  Celiac axis fills off true lumen with mild ostial narrowing.  SMA fills off true lumen with moderate ostial stenosis with associated calcifications.  IMA fills off true lumen.  Left renal artery fills off true lumen.  Right renal artery smaller and fills  faintly off false lumen.  Stable in size of abd ao aneurysm.  Patent flow into both hypogastrics as well as both ext iliacs and both common femorals.  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  No ostial stenosis of profunda nor SFA.  Mult subcentimeter mediastinal lymph nodes.  Upper lobe predominant centrilobular emphysema.  LLL compressive atelectasis.  Trace left pleural effusion.  Tiny right hepatic lobe cyst.  Cholelithiasis.  Atrophic right kidney with poor perfusion.  0.5cm left kidney lower pole cyst.    TTE 01/16/21:  [facility]) mild concentric LVH.  LVEF 55-60%.  Septal motion consistent with postoperative status, basal inferoseptum hypokinetic.  Mild interatrial shunt.  Trileaflet AV, AV mildly calcified, no AS, moderate-severe AR, AVA 2.6cm2.  Calcification of mitral annulus, no evidence of MVP, no MS, mild-moderate MR.  No TS.  Severe TR.  Severe PHTN, RVSP .  RA .  No PS, trace PR.    Moderate RV dysfunction    Carotid doppler 01/16/21:  [facility]) small plaque in both bifurcations of no hemodynamic significance.      H/H: 8.6/30.5  Plt 267  Cr 1.3, eGFR 39  Moderate restrictive lung disease, severe reduction of DLCO on 11/2020 PFTs  ===============================================================  Inpatient Anesthesia Evaluation    Patient Name: Jessica [name],Jessica [name]  Surgeon: Halford Decamp, MD  Patient Age / Sex: 86 y.o. / female  Medical History:     Past Medical History:   Diagnosis Date   . Atrophic kidney     Right   . CAD (coronary artery disease)    . Dissection of thoracic aorta     Type A dissection   . Humerus fracture 05/2021    L Arm in sling s/p fall per dtr   . Iron deficiency anemia    . TIA (transient ischemic attack) 07/01/2001       Past Surgical History:   Procedure Laterality Date   . APPENDECTOMY (OPEN)     . GLAUCOMA SURGERY Bilateral    . HYSTERECTOMY     . MOUTH SURGERY      tonsillectomy   . s/p hemiarch repair            Allergies:   No Known Allergies      Medications:     Current Facility-Administered Medications   Medication Dose Route Frequency Last Rate Last Admin   . acetaminophen (TYLENOL) tablet 1,000 mg  1,000 mg Oral Pre-Op       . gabapentin (NEURONTIN) capsule 300 mg  300 mg Oral Pre-Op       . lactated ringers infusion   Intravenous Continuous 20 mL/hr at 08/06/21 1038 New Bag at 08/06/21 1038   . lidocaine (LMX) 4 % cream   Topical Once PRN       . lidocaine (XYLOCAINE) 1 % injection 1 mL  1 mL Intradermal Once PRN       . mupirocin (BACTROBAN NASAL) 2 % ointment   Nasal Once       . vancomycin (VANCOCIN) 1000 mg in sodium chloride 0.9 % 250 mL (premix)  1,000 mg Intravenous Pre-Op                  Prior to Admission medications    Medication Sig Start Date End Date Taking? Authorizing Provider   acetaminophen-codeine (TYLENOL #2) 300-15 MG per tablet Take 1 tablet by mouth every 4 (four) hours as needed    [provider]   albuterol (PROVENTIL) 2 MG tablet Take 2 mg by mouth 3 (three) times daily    [provider]   allopurinol (ZYLOPRIM) 300 MG tablet Take 300 mg by mouth daily    [provider]   ascorbic acid (VITAMIN C) 100 MG tablet Take 100 mg by mouth daily    [provider]   aspirin EC 325 MG tablet Take 325 mg by mouth daily    [provider]   atorvastatin (LIPITOR) 40 MG tablet Take 40 mg by mouth daily    [provider]   bisoprolol (ZEBETA) 5 MG tablet Take 2.5 mg by mouth daily    [provider]   cyproheptadine (PERIACTIN) 4 MG tablet Take 4 mg by mouth 3 (three) times daily as needed    [provider]   digoxin (LANOXIN) 0.25 MG tablet Take 125 mcg by mouth    [provider]   famotidine (PEPCID) 40 MG tablet Take 40 mg by mouth daily    [provider]   ferrous sulfate 325 (65 FE) MG tablet Take 325 mg by mouth every morning with breakfast    [provider]   furosemide (LASIX) 40 MG  tablet Take 40 mg by mouth daily    [provider]   Latanoprost 0.005 % Emulsion Apply to eye    [provider]   levothyroxine (SYNTHROID) 25 MCG  tablet Take 25 mcg by mouth    [provider]   mupirocin (BACTROBAN) 2 % ointment Apply topically 2 (two) times daily Apply to Both Nostrils 2x Daily for 5 Days Prior to Surgery 07/31/21   Ronaldo Miyamoto, FNP   potassium chloride (KLOR-CON) 10 MEQ tablet Take 10 mEq by mouth    [provider]   vitamin D (CHOLECALCIFEROL) 25 MCG (1000 UT) tablet Take 1,000 Units by mouth daily    [provider]     Vitals   Temp:  [36.4 C (97.5 F)] 36.4 C (97.5 F)  Resp Rate:  [23] 23  BP: (115)/(58) 115/58    Wt Readings from Last 3 Encounters:   08/06/21 49.9 kg (110 lb 1.6 oz)   08/02/21 50.8 kg (112 lb)   07/31/21 50.9 kg (112 lb 3.2 oz)     BMI (Estimated body mass index is 18.9 kg/m as calculated from the following:    Height as of this encounter: 1.626 m (5\' 4" ).    Weight as of this encounter: 49.9 kg (110 lb 1.6 oz).)  Temp Readings from Last 3 Encounters:   08/06/21 36.4 C (97.5 F) (Oral)   07/31/21 36.4 C (97.6 F) (Temporal)   07/03/21 36.7 C (98.1 F) (Oral)     BP Readings from Last 3 Encounters:   08/06/21 115/58   07/31/21 106/70   07/03/21 115/70     Pulse Readings from Last 3 Encounters:   07/31/21 80   07/03/21 84           Labs:   CBC:  Lab Results   Component Value Date    WBC 6.77 07/31/2021    HGB 8.6 (L) 07/31/2021    HCT 30.5 (L) 07/31/2021    PLT 267 07/31/2021       Chemistries:  Lab Results   Component Value Date    NA 140 07/31/2021    K 4.1 07/31/2021    CL 103 07/31/2021    CO2 28 07/31/2021    BUN 23.0 (H) 07/31/2021    CREAT 1.3 (H) 07/31/2021    GLU 88 07/31/2021    HGBA1C 5.0 07/31/2021    CA 8.9 07/31/2021    AST 18 07/31/2021       Coags:  Lab Results   Component Value Date    PT 12.1 07/31/2021    PTT 31 07/31/2021    INR 1.0 07/31/2021     _____________________      Signed by: Meryl Crutch, MD   08/06/21   11:28 AM    =============================================================      Relevant Problems   CARDIO   (+) AAA (abdominal aortic aneurysm) without rupture               Anesthesia Plan    ASA 4     general               (All pertinent medical records reviewed.    NPO guidelines met, unless otherwise specified or case is emergent.     Risks of anesthesia explained to patient/family; including the most common side effects from anesthesia--nausea, vomiting, and sore throat. Uncommon side effects including but not limited to injury to eyes, lips, teeth, gums, vocal cords, allergic reactions, and nerve injuries also explained. Informed consent obtained and the discussed possibility of anesthesia complications including but not limited to: aspiration, medication reactions; need for intervention including use of pressors, line placement or invasive procedures. Discussed possibility of dental  damage, laryngospasm, cardiopulmonary issues, positional injuries and anesthesia options. If MAC sedation is selected then increased possibility of recall was discussed as well as need for possible jaw thrust maneuver and residual sore jaw. Additional risks associated with patients known health conditions and issues also addressed. Patient/family given the opportunity to ask questions and concerns were resolved. Patient understands and wishes to proceed.    Patient/family voiced understanding and wished to proceed with procedure.     It should also be noted that due to the nature of the electronic medical record, some data may not be completely accurate due to artifact, inaccurate data entry by other staff, etc.      )      intravenous induction   Detailed anesthesia plan: general endotracheal  Monitors/Adjuncts: arterial line and spinal drain    Post Op: [name]    Post op pain management: PO analgesics    informed consent obtained    Plan discussed with resident.    ECG reviewed  pertinent labs reviewed  imaging  results reviewed           Signed by: Meryl Crutch, MD 08/06/21 11:28 AM

## 2021-08-06 NOTE — UM Notes (Signed)
Pt admitted for a planned outpt procedure.            Ermalinda Joubert, RN, BSN, CCM  [facility]  [address]  [place], [place] [address]  [phone]  Voice mail confidential

## 2021-08-06 NOTE — Op Note (Signed)
Procedure Date: 08/06/2021     Patient Type: I     SURGEON: Jimeka Balan MD  ASSISTANT:       PREOPERATIVE DIAGNOSIS:  Type 3B aneurysm with dissection, descending thoracic aorta zones 3, 4, 5.     POSTOPERATIVE DIAGNOSIS:  Type 3B aneurysm with dissection, descending thoracic aorta zones 3, 4, 5.     TITLE OF PROCEDURE:  1.  Ultrasound-guided cannulation, bilateral common femoral arteries.  2.  Introduction of catheter into aorta.  3.  Arch and descending thoracic aortogram.  4.  Intravascular ultrasound noncoronary.  5.  Thoracic endovascular aortic repair using Medtronic Valiant Captivia 42  x 42 x 150 mm endograft followed by 42 x 42 x 100 mm endograft followed by  42 x 38 x 150 mm endograft followed by 38 x 38 x 100 mm endograft for  complete coverage of the descending thoracic aorta zones 3, 4, 5.  6.  Left iliofemoral angiogram using CO2.  7.  Closure of left common femoral artery access greater than 12-French,  using an 18-French MANTA device.  8.  Closure of right common femoral artery access using 6-French ProGlide  device.  9.  Radiologic supervision and interpretation of the above.  10.  Radiologic supervision and interpretation for ultrasound guidance.  11.  Radiologic supervision and interpretation for intravascular ultrasound  noncoronary.     CARDIAC SURGEON:  Lorin Picket, MD     VASCULAR SURGEON:  D. Marcy Panning, MD and I was also the primary and access surgeon.     INTERVENTIONAL RADIOLOGY:  Deborra Medina, MD     ANESTHESIA:  General.     INDICATIONS:  The patient is an 86 year old lady who had previously undergone open repair  for type A aortic dissection many years ago.  In late followup, was noted  to have progressive enlargement with massive aneurysmal change of the  descending thoracic aorta involving zones 3, 4, and 5, with the dissection  continuing into the abdominal aorta and right common iliac artery.  There  was a distal seal zone just above the level of the celiac artery.   The  patient was evaluated for endovascular repair and found to be a  satisfactory candidate for that.  Plan was to cover the descending thoracic  aorta from just distal to the left subclavian to above the celiac artery  origin.  A spinal drain was placed preoperatively.  All risks were  discussed in detail.  She understood and consented to this procedure.     DESCRIPTION OF PROCEDURE:  After satisfactory general endotracheal anesthesia had been induced, the  patient was prepped and draped in standard fashion.  A timeout was called.   IV antibiotic was given prior to the start of the case.  Under ultrasound  guidance, bilateral common femoral arteries were accessed with  micropuncture needle and 0.018 wire was advanced proximally into the  iliofemoral vessels, over which the dilator and sheath were placed.  We  then exchanged for 0.035 wire and proceeded to place a 6-French sheath on  the right and a 6-French sheath on the left, which was then upgraded to a  12-French sheath.  Prior to placement of the 12-French sheath, we measured  the distance for subsequent closure with the MANTA device.  The patient was  thin and the distance measured 3 cm for deployment of the MANTA device.     Wires were advanced from both sides and with appropriate manipulation, the  wire was in the  true lumen all the way from the femoral artery up to the  ascending aorta.  A pigtail catheter was placed from the right side and on  the right we exchanged the Bentson wire for a Lunderquist wire, which was  nestled against the aortic valve.  The patient was systemically  anticoagulated with IV heparin 100 units/kg body weight.  We next performed  intravascular ultrasound from the left groin over the rigid wire and  confirmed that we were in the true lumen of the aorta throughout.  We then  performed a very limited angiogram as the patient's creatinine was elevated  to identify the takeoff of the arch vessels and marked the takeoff of the  left  subclavian artery on the screen.     We then removed the 12-French sheath from the left groin and introduced the  Medtronic Valiant Captivia endograft within the self-contained delivery  system, which is 28-French in size.  This was then precisely deployed at  the lower border of the takeoff of the left subclavian artery from the  aorta.  Appropriate corrections had been made to remove all parallax before  deployment of the endograft.  Angiogram following deployment demonstrated  wide patency of all arch vessels including the left subclavian and  exclusion of the aneurysm.  The first endograft was 42 x 42 x 150 mm  Valiant Captivia endograft.  A second was a 42 x 42 x 100.  We then  performed a CO2 angiogram to identify the origin of the celiac artery and  marked this on the screen.  A third piece was required 42 x 38 x 150 mm  endograft.  However, this ended well short of the celiac artery as the  length of the endograft was taken up in the tortuosity of the aorta.  We  therefore deployed a fourth endograft 38 x 38 x 100 mm endograft to just  above the origin of the celiac axis.  Completion angiogram again with CO2  demonstrated wide patency of the mesenteric vessels including the celiac  artery and exclusion of the aneurysm in zones 3, 4, and 5.     The pigtail catheter was then withdrawn to the distal abdominal aorta and  the delivery device removed from the left common femoral artery and the  access closed with an 18-French Mynx device.  CO2 angiogram then  demonstrated good closure of the left common femoral artery without  extravasation or stenosis.  Good runoff into the superficial femoral and  profunda femoris vessels on the left side was noted.  The access on the  right was postclosed with a 6-French ProGlide device.  Total amount of  contrast used was only 30 mL of Visipaque and the remainder was all CO2.   There were no apparent complications.  There was some oozing noted from the  left groin, which was  controlled with manual hemostasis and administration  of protamine to reverse the heparin.  The operating team consisted of Dr.  Lorin Picket, cardiac surgery and D. Marcy Panning, vascular surgery, I was also  primary and access and interventional radiology, Dr. Deborra Medina.           D:  08/06/2021 16:59 PM by Dr. Sandrea Hughs, MD ([id number])  T:  08/06/2021 18:01 PM by       Everlean Cherry: [id number]) (Doc ID: [id number])

## 2021-08-06 NOTE — Progress Notes (Addendum)
CV Surgery Progress Note    POD#: 1    Surgeon: Halford Decamp, MD    8/23: TEVAR FROM ORIGIN OF LEFT SUBCLAVIAN ARTERY TO JUST ABOVE CELIAC ARTERY.    EF:      OR: Gtts: none; Products: none; Rhythm: SR 70s; OR Issues: none reported     Indication for surgery: expanding aortic aneurysm and chronic dissection     PMH: 87 y.o. female  has a past medical history of Atrophic kidney, CAD (coronary artery disease), Dissection of thoracic aorta, Humerus fracture (05/2021), Iron deficiency anemia, and TIA (transient ischemic attack) (07/01/2001).    Cardiologist: note established    [name] needs/Plan for the Day   Spinal drain management; as directed by surgeon  Control SBP with goal SBP <160    24 hour events (name diagnosis if possible)   Stable postop course    Active Diagnoses   Expanding Type IIIB aortic aneurysm with chronic dissection flap.  Now s/p TEVAR 08/06/21    Hx of Type A aortic dissection s/p repair in 09/2020 (in [place])    CKD; functional nephrectomy (atrophic right kidney)  DM (oral medication control preop); HgA1c: 5.0  Hypothyroidism   Iron defic. anemia    Hospital Course   08/06/21: to the OR for TEVAR, uncomplicated postop course.  Spinal drain in place.  Neuro intact.      Infusions     . sodium chloride 85 mL/hr at 08/07/21 0600   . dexmedeTOMIDine     . labetalol (NORMODYNE) infusion     . niCARdipine     . norepinephrine         Vital Signs     Vitals:    08/07/21 0300 08/07/21 0400 08/07/21 0500 08/07/21 0600   BP: 117/58 112/55 116/57 120/59   Pulse: 81 72 71 75   Resp: 18 18 20 19    Temp:  97.2 F (36.2 C)     TempSrc:  Axillary     SpO2: 95% 96% 96% 97%   Weight:       Height:            Physical Exam    General Appearance:  alert, well appearing, and in no distress  Mental status:  alert, oriented to person, place, and time  Neuro:  motor and sensory grossly normal bilaterally  Lungs: clear to auscultation, no wheezes, rales or rhonchi, symmetric air entry  Cardiac: normal rate and  regular rhythm, no murmurs noted  Abdomen:  soft, nontender, nondistended, no masses or organomegaly  Extremities: peripheral pulses normal, no pedal edema, no clubbing or cyanosis   Skin:   normal coloration and turgor, no rashes, no suspicious skin lesions noted  Other:   Groin sites intact    Dispo: remain in [name] until discontinuation of spinal drain      Signed by: Sanda Klein, PA  08/07/2021 6:24 AM     Patient reviewed with [name] team on morning rounds.  Agree with the assessment and plan in progress note  Christene Lye, MD  Cardiac Surgeon

## 2021-08-06 NOTE — UM Notes (Incomplete Revision)
Admit to Inpt (ORDER [id number])    Order information   Date and time  08/06/2021 3:33 pm    97.5, 70, 23, 115/58, 98% RA    Admit to ICAR    Vanc IV pre-op  Cont LR pre-op        86 yr old female presents with expanding aortic aneurysm and chronic dissection for TEVAR.                        Lita Mains, RN, BSN, CCM  [facility]  [address]  [place], [place] [address]  [phone]  Voice mail confidential

## 2021-08-06 NOTE — Progress Notes (Signed)
Vascular Surgery Post Operative Note   Spectra X [id number]  Date Time: 08/06/21 7:20 PM  Patient Name: [name] Day: 1    Interval History :   Surgery: Date 8/23 Procedure: TEVAR    24 HOUR EVENTS:  Pain well controlled with current regimen. UOP 650 cc. Patient has not eaten since surgery. She has not been OOB. VS WNL     Assessment:   Jessica [name] is a 86 y.o.  F with an expanding aortic aneurysm and chronic dissection. Now s/p TEVAR. Doing well post operatively.    Plan:   - NPO, mIVFs  - Multimodal pain control  - Antiemetics PRn  - Continue Q1H neurovascular checks  - Remainder of care per primary team    Labs:       Hematology   Recent Labs     08/06/21  1758   WBC 9.81*   Hgb 7.3*   Hematocrit 25.1*   Platelets 141*        Coagulation   Recent Labs     08/06/21  1758   PT 14.2*   PT INR 1.2*   PTT 33       Chemistry   Recent Labs     08/06/21  1758   Sodium 138   Potassium 3.9   Chloride 105   CO2 26   BUN 18.0   Creatinine 1.0   Glucose 116*   Calcium 8.3   Magnesium 1.8        Liver Function Tests   No results for input(s): AST, ALT, ALKPHOS, PROT, ALB, BILITOTAL, BILIDIRECT, LIP, AMY, PREALB in the last 72 hours.        Medications:     Current Facility-Administered Medications   Medication Dose Route Frequency    [START ON 08/07/2021] aspirin EC  81 mg Oral Daily    cefuroxime  1.5 g Intravenous Q8H    chlorhexidine  15 mL Mouth/Throat Q12H SCH    famotidine  20 mg Oral Daily    mupirocin   Nasal Q12H SCH    polyethylene glycol  17 g Oral Daily    senna-docusate  1 tablet Oral QHS      sodium chloride 85 mL/hr (08/06/21 1800)    dexmedeTOMIDine      labetalol (NORMODYNE) infusion      niCARdipine      norepinephrine       acetaminophen, bisacodyl, labetalol (NORMODYNE) infusion, lactated ringers, niCARdipine, norepinephrine, ondansetron **OR** ondansetron, traMADol     Input/Output:     Patient Lines/Drains/Airways Status       Active Lines, Drains and Airways       Name Placement date Placement time  Site Days    Peripheral IV 08/06/21 18 G Right Antecubital 08/06/21  1026  Antecubital  less than 1    Peripheral IV 08/06/21 18 G Anterior;Right Wrist 08/06/21  1026  Wrist  less than 1    Urethral Catheter Straight-tip 14 Fr. 08/06/21  1424  Straight-tip  less than 1    Peripheral Arterial Line 08/06/21 Right Radial 08/06/21  1315  Radial  less than 1                    Physical Exam:   Current Vitals:   Vitals:    08/06/21 1830   BP:    Pulse: 70   Resp: 13   Temp:    SpO2: 100%       Gen: NAD, lying  comfortably in bed  Neuro: Alert but fatigued, face symmetric,   Cardio: Regular rate and rhythm  Pulm: unlabored breathing, chest clear to auscultation bilaterally, no wheezing  Abd: soft, non-distended, non tender to palpation, no rebound or guarding   B/l groin sites with dressings C/D/I, soft, no evidence of hematoma    Ext: No peripheral edema, bilateral palpable DP, PT, and radial pulses

## 2021-08-06 NOTE — H&P (Addendum)
BRIEF IR HISTORY AND PHYSICAL    Date Time: 08/06/21 12:25 PM    HPI:   Jessica [name] is a 86 y.o. female presents with expanding aortic aneurysm and chronic dissection for TEVAR with Drs Sullivan Lone and Areil Ottey. Risks/benefits explained to pt including including bleeding, infection, blood vessel injury, embolization, emergency surgery, renal injury, spinal injury and failure to place endograft.  Pt state he/she understands and wishes to proceed.        INDICATIONS:   Procedure(s):  TEVAR      PROCEDURALIST COMMENTS BELOW:   TEVAR      PAST MEDICAL HISTORY:     Past Medical History:   Diagnosis Date    Atrophic kidney     Right    CAD (coronary artery disease)     Dissection of thoracic aorta     Type A dissection    Humerus fracture 05/2021    L Arm in sling s/p fall per dtr    Iron deficiency anemia     TIA (transient ischemic attack) 07/01/2001       PAST SURGICAL HISTORY     Past Surgical History:   Procedure Laterality Date    APPENDECTOMY (OPEN)      GLAUCOMA SURGERY Bilateral     HYSTERECTOMY      MOUTH SURGERY      tonsillectomy    s/p hemiarch repair         Family History:   History reviewed. No pertinent family history.    Social History:     Social History     Socioeconomic History    Marital status: Unknown     Spouse name: Not on file    Number of children: Not on file    Years of education: Not on file    Highest education level: Not on file   Occupational History    Not on file   Tobacco Use    Smoking status: Former     Packs/day: 1.00     Years: 20.00     Pack years: 20.00     Types: Cigarettes     Quit date: 67     Years since quitting: 32.6    Smokeless tobacco: Never   Vaping Use    Vaping Use: Never used   Substance and Sexual Activity    Alcohol use: Not Currently    Drug use: Never    Sexual activity: Not on file   Other Topics Concern    Not on file   Social History Narrative    Not on file     Social Determinants of Health     Financial Resource Strain: Not on file   Food Insecurity:  Not on file   Transportation Needs: Not on file   Physical Activity: Not on file   Stress: Not on file   Social Connections: Not on file   Intimate Partner Violence: Not on file   Housing Stability: Not on file         REVIEW OF SYSTEMS REVIEWED:   YES  ( x )        HOME MEDICATIONS     Prior to Admission medications    Medication Sig Start Date End Date Taking? Authorizing Provider   allopurinol (ZYLOPRIM) 300 MG tablet Take 300 mg by mouth daily   Yes [provider]   ascorbic acid (VITAMIN C) 100 MG tablet Take 100 mg by mouth daily   Yes [provider]  aspirin EC 325 MG tablet Take 325 mg by mouth daily   Yes [provider]   atorvastatin (LIPITOR) 40 MG tablet Take 40 mg by mouth daily   Yes [provider]   bisoprolol (ZEBETA) 5 MG tablet Take 2.5 mg by mouth daily   Yes [provider]   cyproheptadine (PERIACTIN) 4 MG tablet Take 4 mg by mouth 3 (three) times daily as needed   Yes [provider]   digoxin (LANOXIN) 0.25 MG tablet Take 125 mcg by mouth   Yes [provider]   famotidine (PEPCID) 40 MG tablet Take 40 mg by mouth daily   Yes [provider]   ferrous sulfate 325 (65 FE) MG tablet Take 325 mg by mouth every morning with breakfast   Yes [provider]   furosemide (LASIX) 40 MG tablet Take 40 mg by mouth daily   Yes [provider]   Latanoprost 0.005 % Emulsion Apply to eye   Yes [provider]   levothyroxine (SYNTHROID) 25 MCG tablet Take 25 mcg by mouth   Yes [provider]   mupirocin (BACTROBAN) 2 % ointment Apply topically 2 (two) times daily Apply to Both Nostrils 2x Daily for 5 Days Prior to Surgery 07/31/21  Yes Ronaldo Miyamoto, FNP   potassium chloride (KLOR-CON) 10 MEQ tablet Take 10 mEq by mouth   Yes [provider]   vitamin D (CHOLECALCIFEROL) 25 MCG (1000 UT) tablet Take 1,000 Units by mouth daily   Yes [provider]   acetaminophen-codeine (TYLENOL  #2) 300-15 MG per tablet Take 1 tablet by mouth every 4 (four) hours as needed    [provider]   albuterol (PROVENTIL) 2 MG tablet Take 2 mg by mouth 3 (three) times daily    [provider]   albuterol sulfate HFA (PROVENTIL) 108 (90 Base) MCG/ACT inhaler Inhale 2 puffs into the lungs every 4 (four) hours as needed for Wheezing    [provider]   albuterol sulfate HFA (PROVENTIL) 108 (90 Base) MCG/ACT inhaler Inhale 2 puffs into the lungs every 6 (six) hours as needed for Wheezing  08/06/21  [provider]         INPATIENT MEDICATIONS     Current Facility-Administered Medications   Medication Dose Route Frequency    vancomycin  1,000 mg Intravenous Pre-Op      lactated ringers 20 mL/hr at 08/06/21 1038     lidocaine, lidocaine      ALLERGIES:   No Known Allergies      PREVIOUS REACTION TO SEDATION MEDICATIONS     NO ( x)   YES ( )      PHYSICAL EXAM     AIRWAY CLASSIFICATION (Mallampati):    CLASS I   (  )   CLASS II  (  x)    CLASS III  (  )     CLASS IV  (  )    INTUBATED (  )    CARDIAC :   ( x )  RRR  (  )  IRREG  (  )  MURMUR      LUNGS:   (  x)  CLEAR  (  )  DIMINISHED    (  ) LEFT   (  )  RIGHT  (  )  ABSENT          (  ) LEFT   (  )  RIGHT  (  )  TUBES            (  ) LEFT   (  )  RIGHT          ABDOMEN:   benign      NEURO:   aaox3      OTHER/SURGICAL SITE:   Left humerus fx    LABS:     Lab Results   Component Value Date/Time    WBC 6.77 07/31/2021 04:16 PM    HCT 30.5 (L) 07/31/2021 04:16 PM    PLT 267 07/31/2021 04:16 PM    INR 1.0 07/31/2021 04:16 PM    PT 12.1 07/31/2021 04:16 PM    PTT 31 07/31/2021 04:16 PM    BUN 23.0 (H) 07/31/2021 04:16 PM    CREAT 1.3 (H) 07/31/2021 04:16 PM    GLU 88 07/31/2021 04:16 PM    K 4.1 07/31/2021 04:16 PM           ASA PHYSICAL STATUS   (  )  ASA 1   HEALTHY PATIENT  (  )  ASA 2   MILD SYSTEMIC ILLNESS  ( x )  ASA 3   SYSTEMIC DISEASE, NOT INCAPACITATING  (  )  ASA 4   SEVERE SYSTEMIC DISEASE, DISEASE IS CONSTANT THREAT TO  LIFE  (  )  ASA 5   MORIBUND CONDITION, NOT EXPECTED TO LIVE >24 HOURS            IRRESPECTIVE OF PROCEDURE  (  )  E           EMERGENCY PROCEDURE     (  ) PATIENT  (   ) GUARDIAN AGREE TO LIFTING DNR FOR PROCEDURE      PLANNED SEDATION:   (  ) NO SEDATION  (  ) MODERATE SEDATION  ( x ) DEEP SEDATION WITH ANESTHESIA      CONCLUSION:   PATIENT HAS BEEN REASSESSED IMMEDIATELY PRIOR TO THE PROCEDURE   AND IS AN APPROPRIATE CANDIDATE FOR THE PLANNED SEDATION AND   PROCEDURE.  RISKS, BENEFITS AND ALTERNATIVES TO THE PLANNED   PROCEDURE AND SEDATION HAVE BEEN EXPLAINED TO THE PATIENT   OR GUARDIAN.    ( x )  YES  (  )  EMERGENCY CONSENT         Signed by Otilio Miu, MD  Interventional Radiologist  [place]- Section of Vascular & Interventional Radiology    Contact Numbers:    Regular business hours (8A-5P M-F):  [facility]:  [phone]  [facility]:  [phone]  [facility]yson Babinski [facility]: [phone]    After hours:  Answering service:  [phone]

## 2021-08-07 ENCOUNTER — Encounter: Payer: Self-pay | Admitting: Anesthesiology

## 2021-08-07 ENCOUNTER — Inpatient Hospital Stay: Payer: Medicare PPO

## 2021-08-07 DIAGNOSIS — R739 Hyperglycemia, unspecified: Secondary | ICD-10-CM

## 2021-08-07 DIAGNOSIS — I712 Thoracic aortic aneurysm, without rupture: Secondary | ICD-10-CM

## 2021-08-07 DIAGNOSIS — E039 Hypothyroidism, unspecified: Secondary | ICD-10-CM

## 2021-08-07 DIAGNOSIS — I714 Abdominal aortic aneurysm, without rupture: Secondary | ICD-10-CM

## 2021-08-07 DIAGNOSIS — E8770 Fluid overload, unspecified: Secondary | ICD-10-CM

## 2021-08-07 LAB — CBC
Absolute NRBC: 0 10*3/uL (ref 0.00–0.00)
Absolute NRBC: 0 10*3/uL (ref 0.00–0.00)
Hematocrit: 25.8 % — ABNORMAL LOW (ref 34.7–43.7)
Hematocrit: 25.1 % — ABNORMAL LOW (ref 34.7–43.7)
Hgb: 7.7 g/dL — ABNORMAL LOW (ref 11.4–14.8)
Hgb: 7.5 g/dL — ABNORMAL LOW (ref 11.4–14.8)
MCH: 20.6 pg — ABNORMAL LOW (ref 25.1–33.5)
MCH: 20.2 pg — ABNORMAL LOW (ref 25.1–33.5)
MCHC: 29.8 g/dL — ABNORMAL LOW (ref 31.5–35.8)
MCHC: 29.9 g/dL — ABNORMAL LOW (ref 31.5–35.8)
MCV: 69 fL — ABNORMAL LOW (ref 78.0–96.0)
MCV: 67.5 fL — ABNORMAL LOW (ref 78.0–96.0)
Nucleated RBC: 0 /100 WBC (ref 0.0–0.0)
Nucleated RBC: 0 /100 WBC (ref 0.0–0.0)
Platelets: 122 10*3/uL — ABNORMAL LOW (ref 142–346)
Platelets: 152 10*3/uL (ref 142–346)
RBC: 3.74 10*6/uL — ABNORMAL LOW (ref 3.90–5.10)
RBC: 3.72 10*6/uL — ABNORMAL LOW (ref 3.90–5.10)
RDW: 19 % — ABNORMAL HIGH (ref 11–15)
RDW: 19 % — ABNORMAL HIGH (ref 11–15)
WBC: 8.01 10*3/uL (ref 3.10–9.50)
WBC: 9.53 10*3/uL — ABNORMAL HIGH (ref 3.10–9.50)

## 2021-08-07 LAB — HEPATIC FUNCTION PANEL
ALT: 8 U/L (ref 0–55)
AST (SGOT): 17 U/L (ref 5–34)
Albumin/Globulin Ratio: 0.4 — ABNORMAL LOW (ref 0.9–2.2)
Albumin: 2.2 g/dL — ABNORMAL LOW (ref 3.5–5.0)
Alkaline Phosphatase: 57 U/L (ref 37–117)
Bilirubin Direct: 0.4 mg/dL (ref 0.0–0.5)
Bilirubin Indirect: 0.4 mg/dL (ref 0.2–1.0)
Bilirubin, Total: 0.8 mg/dL (ref 0.2–1.2)
Globulin: 4.9 g/dL — ABNORMAL HIGH (ref 2.0–3.6)
Protein, Total: 7.1 g/dL (ref 6.0–8.3)

## 2021-08-07 LAB — BASIC METABOLIC PANEL
Anion Gap: 5 (ref 5.0–15.0)
BUN: 19 mg/dL (ref 7.0–19.0)
CO2: 24 mEq/L (ref 22–29)
Calcium: 8.5 mg/dL (ref 7.9–10.2)
Chloride: 108 mEq/L (ref 100–111)
Creatinine: 1.1 mg/dL — ABNORMAL HIGH (ref 0.6–1.0)
Glucose: 117 mg/dL — ABNORMAL HIGH (ref 70–100)
Potassium: 4.1 mEq/L (ref 3.5–5.1)
Sodium: 137 mEq/L (ref 136–145)

## 2021-08-07 LAB — PT AND APTT
PT INR: 1.1 (ref 0.9–1.1)
PT: 13.5 s — ABNORMAL HIGH (ref 10.1–12.9)
PTT: 32 s (ref 27–39)

## 2021-08-07 LAB — MAGNESIUM: Magnesium: 2.1 mg/dL (ref 1.6–2.6)

## 2021-08-07 LAB — GFR: EGFR: 47.3

## 2021-08-07 MED ORDER — LEVOTHYROXINE SODIUM 25 MCG PO TABS
25.0000 ug | ORAL_TABLET | Freq: Every day | ORAL | Status: DC
Start: 2021-08-07 — End: 2021-08-10
  Administered 2021-08-07 – 2021-08-10 (×4): 25 ug via ORAL
  Filled 2021-08-07 (×4): qty 1

## 2021-08-07 MED ORDER — ALLOPURINOL 100 MG PO TABS
300.0000 mg | ORAL_TABLET | Freq: Every day | ORAL | Status: DC
Start: 2021-08-07 — End: 2021-08-10
  Administered 2021-08-07 – 2021-08-10 (×4): 300 mg via ORAL
  Filled 2021-08-07 (×4): qty 3

## 2021-08-07 MED ORDER — ATORVASTATIN CALCIUM 40 MG PO TABS
40.0000 mg | ORAL_TABLET | Freq: Every evening | ORAL | Status: DC
Start: 2021-08-07 — End: 2021-08-10
  Administered 2021-08-07 – 2021-08-09 (×3): 40 mg via ORAL
  Filled 2021-08-07 (×2): qty 1
  Filled 2021-08-07: qty 4

## 2021-08-07 MED ORDER — LATANOPROST 0.005 % OP SOLN
1.0000 [drp] | Freq: Every day | OPHTHALMIC | Status: DC
Start: 2021-08-07 — End: 2021-08-10
  Administered 2021-08-07 – 2021-08-10 (×4): 1 [drp] via OPHTHALMIC
  Filled 2021-08-07 (×2): qty 1

## 2021-08-07 MED ORDER — FUROSEMIDE 40 MG PO TABS
40.0000 mg | ORAL_TABLET | Freq: Every day | ORAL | Status: DC
Start: 2021-08-07 — End: 2021-08-10
  Administered 2021-08-07 – 2021-08-10 (×4): 40 mg via ORAL
  Filled 2021-08-07: qty 2
  Filled 2021-08-07 (×3): qty 1

## 2021-08-07 MED ORDER — FUROSEMIDE 10 MG/ML IJ SOLN
20.0000 mg | Freq: Once | INTRAMUSCULAR | Status: AC
Start: 2021-08-07 — End: 2021-08-07
  Administered 2021-08-07: 20 mg via INTRAVENOUS
  Filled 2021-08-07: qty 4

## 2021-08-07 MED ORDER — DIGOXIN 125 MCG PO TABS
0.1250 mg | ORAL_TABLET | Freq: Every day | ORAL | Status: DC
Start: 2021-08-07 — End: 2021-08-10
  Administered 2021-08-07 – 2021-08-10 (×4): 0.125 mg via ORAL
  Filled 2021-08-07 (×4): qty 1

## 2021-08-07 MED ORDER — ALBUTEROL SULFATE (2.5 MG/3ML) 0.083% IN NEBU
2.5000 mg | INHALATION_SOLUTION | Freq: Four times a day (QID) | RESPIRATORY_TRACT | Status: DC | PRN
Start: 2021-08-07 — End: 2021-08-10

## 2021-08-07 NOTE — Op Note (Signed)
Date of Procedure: 08/06/2021  Procedure:   TEVAR [MDT Val Cap 42 x 42 x 150 mm; 42 x 42 x 100 mm;  42 x 38 x 150 mm; 38 x 38 x 100 mm endografts]  USG guided access and device closure of the R CFA [> 12 Fr] [16109] with an 18 Fr Manta device  Aortography: non-selective  IVUS: non-coronary  Rad S and I: TEVAR  Rad S and I: USG  Rad S and I: IVUS  CV Surgeon: Lorin Picket, MD  Vascular Surgeon Valera Castle and Access]: D. Marcy Panning, MD  IR: Ruthine Dose, MD  PreopDx: Aneurysmal chronic type IIIB AD  PostopDx: Same  Description: The patient was prepped and draped in the usual sterile fashion. The L CFA was accessed with a Micropuncture needle under USG and cannulated with a 6 Fr sheath. The vessel depth was assessed with the [facility] device and then re-cannulated with an 11 Fr sheath. A Bentson wire was advanced into the ascending aorta through a vertebral cathter and exchanged for a Lunderquist wire. The R CFA was accessed with a Micropuncture needle under USG guidance and cannulated with a 6 Fr sheath. A marker pig-tail was then advanced into the ascending aorta over a Glide Wire. The patient was heparinized and a Volcano IVUS probe was advanced from the L CFA into the arch. The wires were within the TL along their entire courses. The IVUS prove and sheath were exchanged for a MDT Val Cap 42 x 42 x 150 mm endograft, which was advanced into the mid-arch. All parallax was removed from the device and an arch aortogram was performed. The distal margin of the L SCA was marked on the screen and the device was deployed flush with this mark. Post deployment aortography demonstrated no visible IA endoleak. We the re-positioned the pig-tail in zone V and performed a CO2 aortogram. The celiac was marked on the screen and three additional components were deployed in the following order: MDT Val Cap 42 x 42 x 100 mm; 42 x 38 x 150 mm and 38 x 38 x 100 mm. Completion CO2 visceral aortography demonstrated no visible IB with good filling of  the celiac and SMA. The device was withdrawn over the wire and an 18 Fr Manta device was deployed with good hemostatic effect. CO2 left ileo-femoral arteriography demonstrated no stenosis and good run-off. The R CFA sheath was removed and post-closed with a single Proglide. There were no evident complications.

## 2021-08-07 NOTE — Progress Notes (Addendum)
Initial Case Management Assessment and Discharge Planning  [facility]   Patient Name: Jessica [name], Jessica [name]   Date of Birth 03/14/1935   Attending Physician: Halford Decamp, MD   Primary Care Physician: Pcp, None, MD   Length of Stay 1   Reason for Consult / Chief Complaint TEVAR        Situation   Admission DX:   1. AAA (abdominal aortic aneurysm) without rupture    2. Pre-operative cardiovascular examination    3. Dissection of thoracic aorta    4. Aneurysm of descending thoracic aorta        A/O Status: X 3    LACE Score: 2    Patient admitted from: ER  Admission Status: inpatient    Health Care Agent: Self     Background     Advanced directive:   <no information>    Code Status:   Full Code     Residence: Multi-story home    PCP: Algis Downs [phone]  Patient Contact:   [phone] (home)     [phone] (mobile)     Emergency contact:   Extended Emergency Contact Information  Primary Emergency Contact: Klingensmith,Jessica  Mobile Phone: [phone]  Relation: Daughter      ADL/IADL's: Independent  Previous Level of function: 7 Independent     DME: Corporate investment banker - manual    Pharmacy:     [facility] - [place], [place] - [address]  [address]  [place] [address]  Phone: [phone] Fax: [phone]    [id number] [place], [place] - [phone]  [address]  [facility] [place] [address]  Phone: [phone] Fax: [phone]      Prescription Coverage: Yes    Home Health: The patient is not currently receiving home health services.    Previous SNF/AR: None     COVID Vaccine Status: x3    Date First IMM given: 8/24  UAI on file?: No  Transport for discharge? Mode of transportation: Sales executive - Family/Friend to drive patient  Agreeable to Home with family post-discharge:  Yes     Assessment   Assessment completed at bedside with patient/daughter/son.    BARRIERS  TO DISCHARGE: None      Recommendation   D/C Plan A: Home with family    D/C Plan B: Home with home health

## 2021-08-07 NOTE — Plan of Care (Signed)
Patient: Jessica [name], Jessica [name]   Date: 08/07/2021  LOS: 1  Attending: Halford Decamp, MD  APP: Theora Master care of patient at: 57    Review of Systems    Neuro/Activity:     CV: SCP pressures 14-18 PA aware. Small amount of blood drainage at insertion site. No changes    Respiratory:     GI:     GU: low u/o ->lasix->550cc    Skin: L groin scant amount of drainage, outlined. No hematoma or bleeding    ID:     Dispo/Plan:   Problem: Compromised Tissue integrity  Goal: Damaged tissue is healing and protected  08/07/2021 0559 by Thalia Bloodgood, RN  Outcome: Progressing  Flowsheets (Taken 08/07/2021 0559)  Damaged tissue is healing and protected:   Monitor/assess Braden scale every shift   Reposition patient every 2 hours and as needed unless able to reposition self   Relieve pressure to bony prominences for patients at moderate and high risk   Keep intact skin clean and dry   Monitor external devices/tubes for correct placement to prevent pressure, friction and shearing  08/06/2021 2126 by Thalia Bloodgood, RN  Outcome: Progressing  Goal: Nutritional status is improving  08/07/2021 0559 by Thalia Bloodgood, RN  Outcome: Progressing  Flowsheets (Taken 08/07/2021 0559)  Nutritional status is improving:   Include patient/patient care companion in decisions related to nutrition   Assist patient with eating  08/06/2021 2126 by Thalia Bloodgood, RN  Outcome: Not Progressing     Problem: Moderate/High Fall Risk Score >5  Goal: Patient will remain free of falls  08/07/2021 0559 by Thalia Bloodgood, RN  Outcome: Progressing  Flowsheets (Taken 08/07/2021 0559)  Moderate Risk (6-13): LOW-Fall Interventions Appropriate for Low Fall Risk  VH Moderate Risk (6-13): Include family/significant other in multidisciplinary discussion regarding plan of care as appropriate  08/06/2021 2126 by Thalia Bloodgood, RN  Outcome: Progressing     Problem: Safety  Goal: Patient will be free from injury during hospitalization  08/07/2021 0559 by Thalia Bloodgood,  RN  Outcome: Progressing  Flowsheets (Taken 08/07/2021 0559)  Patient will be free from injury during hospitalization:   Assess patient's risk for falls and implement fall prevention plan of care per policy   Provide and maintain safe environment  08/06/2021 2126 by Thalia Bloodgood, RN  Outcome: Progressing  Goal: Patient will be free from infection during hospitalization  08/07/2021 0559 by Thalia Bloodgood, RN  Outcome: Progressing  Flowsheets (Taken 08/07/2021 0559)  Free from Infection during hospitalization:   Assess and monitor for signs and symptoms of infection   Monitor lab/diagnostic results   Monitor all insertion sites (i.e. indwelling lines, tubes, urinary catheters, and drains)   Encourage patient and family to use good hand hygiene technique  08/06/2021 2126 by Thalia Bloodgood, RN  Outcome: Progressing     Problem: Pain  Goal: Pain at adequate level as identified by patient  08/07/2021 0559 by Thalia Bloodgood, RN  Outcome: Progressing  Flowsheets (Taken 08/07/2021 0559)  Pain at adequate level as identified by patient:   Identify patient comfort function goal   Assess pain on admission, during daily assessment and/or before any "as needed" intervention(s)   Evaluate if patient comfort function goal is met  08/06/2021 2126 by Thalia Bloodgood, RN  Outcome: Progressing     Problem: Side Effects from Pain Analgesia  Goal: Patient will experience minimal side effects of analgesic therapy  08/07/2021 0559 by Thalia Bloodgood, RN  Outcome: Progressing  Flowsheets (Taken  08/07/2021 0559)  Patient will experience minimal side effects of analgesic therapy:   Monitor/assess patient's respiratory status (RR depth, effort, breath sounds)   Assess for changes in cognitive function  08/06/2021 2126 by Thalia Bloodgood, RN  Outcome: Progressing     Problem: Discharge Barriers  Goal: Patient will be discharged home or other facility with appropriate resources  08/07/2021 0559 by Thalia Bloodgood, RN  Outcome: Progressing  Flowsheets  (Taken 08/07/2021 0559)  Discharge to home or other facility with appropriate resources: Provide appropriate patient education  08/06/2021 2126 by Thalia Bloodgood, RN  Outcome: Progressing     Problem: Psychosocial and Spiritual Needs  Goal: Demonstrates ability to cope with hospitalization/illness  08/07/2021 0559 by Thalia Bloodgood, RN  Outcome: Progressing  08/06/2021 2126 by Thalia Bloodgood, RN  Outcome: Progressing

## 2021-08-07 NOTE — Plan of Care (Signed)
Patient: Jessica [name], Jessica [name]   Date: 08/07/2021  LOS: 1  Attending: Halford Decamp, MD  APP: Fanny Dance., PA    Assumed care of patient at: 0730    SHIFT EVENTS:   0725: Handoff done with offgoing shift. Lumbar drain dressing saturated and bloody. Provider and anesthesia made aware.   1452: Lumbar drain removed by anesthesia. No change in motor function & sensation. In bed rest until 1900.   1855: Right radial a-line removed.        Review of Systems:      Neuro/Activity: Alert and oriented to person, place, and situation. Reoriented to time as needed.   PERRL. Clear speech. Follows commands.   Remained on bed rest for shift due to lumbar drain precautions, able to flex hips and extend knees.     CV: S1, S2 sounds.   Normal sinus rhythm.   Pulses palpable.     Respiratory: Clear diminished lung sounds.   Attempted to wean to room air, desaturated to 88%. Kept on 1L nasal cannula, oxygen saturation 95%-97%.   Incentive Spirometry Achieved: 500 mL     GI: Soft, flat, non-distended.   Bowel sounds present.   Diet: Heart healthy  Last BM: 08/04/2021     GU: Foley catheter in place.   Clear yellow urine.  Urine output about 50 - 60 mL/hr.     Skin: Warm, dry, intact.    Midsternal scar.   Bilateral groin sites covered with gauze and tegaderm. Left groin site with old serosanguinous drainage.   Lumbar drain puncture site covered with gauze + tegaderm.     ID: Afebrile during shift.   Last dose of cefuroxime administered.        Dispo/Plan:   Transfer to [facility].      Problem: Compromised Tissue integrity  Goal: Damaged tissue is healing and protected  Outcome: Progressing  Flowsheets (Taken 08/07/2021 1907)  Damaged tissue is healing and protected:   Reposition patient every 2 hours and as needed unless able to reposition self   Provide wound care per wound care algorithm   Monitor/assess Braden scale every shift   Relieve pressure to bony prominences for patients at moderate and high risk   Avoid shearing injuries   Increase  activity as tolerated/progressive mobility   Keep intact skin clean and dry   Use bath wipes, not soap and water, for daily bathing   Use incontinence wipes for cleaning urine, stool and caustic drainage. Foley care as needed   Monitor external devices/tubes for correct placement to prevent pressure, friction and shearing   Utilize specialty bed  Goal: Nutritional status is improving  Outcome: Progressing  Flowsheets (Taken 08/07/2021 1907)  Nutritional status is improving:   Assist patient with eating   Allow adequate time for meals   Include patient/patient care companion in decisions related to nutrition     Problem: Moderate/High Fall Risk Score >5  Goal: Patient will remain free of falls  Outcome: Progressing  Flowsheets (Taken 08/07/2021 0800)  High (Greater than 13):   LOW-Fall Interventions Appropriate for Low Fall Risk   MOD-Remain with patient during toileting   MOD-Place Fall Risk level on whiteboard in room   HIGH-Bed alarm on at all times while patient in bed   HIGH-Initiate use of floor mats as appropriate   HIGH-Consider use of low bed   MOD-Request PT/OT consult order for patients with gait/mobility impairment     Problem: Safety  Goal: Patient will be free from  injury during hospitalization  Outcome: Progressing  Flowsheets (Taken 08/07/2021 1907)  Patient will be free from injury during hospitalization:   Assess patient's risk for falls and implement fall prevention plan of care per policy   Use appropriate transfer methods   Ensure appropriate safety devices are available at the bedside   Include patient/ family/ care giver in decisions related to safety   Hourly rounding   Provide and maintain safe environment   Assess for patients risk for elopement and implement Elopement Risk Plan per policy  Goal: Patient will be free from infection during hospitalization  Outcome: Progressing  Flowsheets (Taken 08/07/2021 1907)  Free from Infection during hospitalization:   Monitor all insertion sites (i.e.  indwelling lines, tubes, urinary catheters, and drains)   Monitor lab/diagnostic results   Assess and monitor for signs and symptoms of infection   Encourage patient and family to use good hand hygiene technique     Problem: Pain  Goal: Pain at adequate level as identified by patient  Outcome: Progressing  Flowsheets (Taken 08/07/2021 1907)  Pain at adequate level as identified by patient:   Assess for risk of opioid induced respiratory depression, including snoring/sleep apnea. Alert healthcare team of risk factors identified.   Identify patient comfort function goal   Assess pain on admission, during daily assessment and/or before any "as needed" intervention(s)   Reassess pain within 30-60 minutes of any procedure/intervention, per Pain Assessment, Intervention, Reassessment (AIR) Cycle   Evaluate if patient comfort function goal is met   Evaluate patient's satisfaction with pain management progress   Include patient/patient care companion in decisions related to pain management as needed     Problem: Psychosocial and Spiritual Needs  Goal: Demonstrates ability to cope with hospitalization/illness  Outcome: Progressing  Flowsheets (Taken 08/07/2021 1907)  Demonstrates ability to cope with hospitalizations/illness:   Assist patient to identify own strengths and abilities   Provide quiet environment   Encourage verbalization of feelings/concerns/expectations   Encourage patient to set small goals for self   Encourage participation in diversional activity   Include patient/ patient care companion in decisions     Problem: Post-op Phase - Cardiac Surgery  Goal: Effective breathing pattern is maintained  Outcome: Progressing  Flowsheets (Taken 08/07/2021 1907)  Effective breathing pattern is maintained:   Maintain SpO2 level per LIP order   Position patient for maximum ventilatory efficiency with HOB to a minimum of 30 degrees when hemodynamically stable   Reinforce use of ordered respiratory interventions (i.e. CPAP,  BiPAP, Incentive Spirometer, Acapella)   Monitor for sleep apnea  Goal: Patient will remain free from post-op complications  Outcome: Progressing  Flowsheets (Taken 08/07/2021 1907)  Patient will remain free from post-op complications:   Maintain sternal precautions as described in patient education   Maintain pericare/Foley care per protocol   Discontinue Foley within 24 hours unless otherwise ordered [facility])   VTE prevention: Administer anticoagulants and/or apply anti-embolism stockings/devices as ordered   Monitor/assess blood glucose per LIP orders   Assess dressing and reinforce or change per LIP order   Monitor blood glucose AC and HS and PRN after insulin drip has been discontinued   Assess surgical incision/wound site and treat per LIP order  Goal: Mobility/activity is maintained at optimal level  Outcome: Progressing  Flowsheets (Taken 08/07/2021 1907)  Mobility/activity is maintained at optimal level:   Increase mobility as tolerated/progressive mobility   Plan activities to conserve energy. Plan rest periods.     Problem: Peripheral Neurovascular Impairment  Goal: Extremity color, movement, sensation are maintained or improved  Outcome: Progressing  Flowsheets (Taken 08/07/2021 1907)  Extremity color, movement, sensation are maintained or improved:   Assess and monitor application of corrective devices (cast, brace, splint), check skin integrity   Increase mobility as tolerated/progressive mobility   Teach/review/reinforce ankle pump exercises   Assess extremity for proper alignment   VTE Prevention: Administer anticoagulant(s) and/or apply anti-embolism stockings/devices as ordered

## 2021-08-07 NOTE — Progress Notes (Signed)
CTSP to pull spinal drain.  0725 PT/INR 13.5/1.1, PTT32, [id number]  Per RN, pt has not received any anticoagulants x24hrs    Large amount of clot noted around entrance of spinal drain.  Only minimal ozzing and no active bleeding noted.  Spinal drain pulled without resistance.  Black tip intact.  Dressing placed and neuro checks per protocol.  Allayne Stack, MD

## 2021-08-07 NOTE — Progress Notes (Signed)
Pt transferred to [facility] accompanied by two [facility] RN's.    VSS.

## 2021-08-07 NOTE — Progress Notes (Signed)
Vascular Surgery Progress Note  [id number]    Date Time: 08/07/21 1:12 PM  Patient Name: Jessica [name]  Attending Vascular Physician: Dr. Billie Lade Day: 2     Interval History:    Surgery: Date: 08/07/2021  Procedure:TEVAR    Interval events:   No acute events over night   Endorsed anticipatory pain at groin access sites   H/H 7.5/25.1 Cr 1.1  PT 13.5   Tmax 99.0, VSS , 1 L O2 NC SpO2 96%    Assessment:   Jessica [name] is a 86 y.o. female with a past medical history of atrophic kidney, CAD,  thoracic aortic dissection, humerus fracture June 2022, iron deficient anemia, and TIA 07/01/2001 who presented for an expanding aortic aneurysm and chronic dissection now s/p TEVAR 08/06/2021.   Plan:    Multimodal pain control regimen  MAP >65   Transfuse Hgb goal > 7.0g/dL  NVC per protocol   ASA 81 mg, Lasix 40 mg   Monitor bilateral groin sites  Encourage pulmonary toileting   DVT ppx: SCDs in place     Labs:      Hematology   Recent Labs     08/07/21  0725 08/07/21  0217 08/06/21  1758   WBC 9.53* 8.01 9.81*   Hgb 7.5* 7.7* 7.3*   Hematocrit 25.1* 25.8* 25.1*   Platelets 152 122* 141*        Coagulation   Recent Labs     08/07/21  0725 08/06/21  1758   PT 13.5* 14.2*   PT INR 1.1 1.2*   PTT 32 33       Chemistry   Recent Labs     08/07/21  0217 08/06/21  1758   Sodium 137 138   Potassium 4.1 3.9   Chloride 108 105   CO2 24 26   BUN 19.0 18.0   Creatinine 1.1* 1.0   Glucose 117* 116*   Calcium 8.5 8.3   Magnesium 2.1 1.8        Liver Function Tests   Recent Labs     08/07/21  0217   AST (SGOT) 17   ALT 8   Alkaline Phosphatase 57   Protein, Total 7.1   Albumin 2.2*   Bilirubin, Total 0.8   Bilirubin Direct 0.4           Medications:     Current Facility-Administered Medications   Medication Dose Route Frequency    allopurinol  300 mg Oral Daily    aspirin EC  81 mg Oral Daily    atorvastatin  40 mg Oral QHS    chlorhexidine  15 mL Mouth/Throat Q12H SCH    digoxin  0.125 mg Oral Daily    famotidine  20 mg Oral Daily     furosemide  40 mg Oral Daily    latanoprost  1 drop Both Eyes Daily    levothyroxine  25 mcg Oral Daily at 0600    mupirocin   Nasal Q12H [facility]    polyethylene glycol  17 g Oral Daily    senna-docusate  1 tablet Oral QHS      sodium chloride 10 mL/hr at 08/07/21 1300    dexmedeTOMIDine      labetalol (NORMODYNE) infusion      niCARdipine      norepinephrine       acetaminophen, albuterol, bisacodyl, labetalol (NORMODYNE) infusion, lactated ringers, niCARdipine, norepinephrine, ondansetron **OR** ondansetron, traMADol     Input/output:  Patient Lines/Drains/Airways Status       Active Lines, Drains and Airways       Name Placement date Placement time Site Days    Peripheral IV 08/06/21 18 G Right Antecubital 08/06/21  1026  Antecubital  1    Peripheral IV 08/06/21 18 G Anterior;Right Wrist 08/06/21  1026  Wrist  1    Urethral Catheter Straight-tip 14 Fr. 08/06/21  1424  Straight-tip  less than 1    Lumbar Drain 08/06/21  1645  anesthesia department.  less than 1    Peripheral Arterial Line 08/06/21 Right Radial 08/06/21  1315  Radial  less than 1                      Review of Systems:    As per HPI, stated above    Physical Exam:    Current Vitals:   Vitals:    08/07/21 1300   BP: 110/62   Pulse: 91   Resp: (!) 25   Temp:    SpO2: 96%       Vital signs in the last 24hrs:   Temp:  [96.3 F (35.7 C)-99 F (37.2 C)] 99 F (37.2 C)  Heart Rate:  [60-91] 91  Resp Rate:  [11-30] 25  BP: (104-135)/(55-67) 110/62  Arterial Line BP: (92-148)/(43-66) 92/43     General: NAD, resting in bed comfortably   Neuro: A&O x 3 speech clear and congruent, face symmetric, tongue midline  Cardio: RRR   Pulm: unlabored breathing, on room air, no audible wheezing, rhonchi.   Abd: soft, ND, NTTP     Ext:    Warm   Motor: intact   Sensory: intact     Pulse Exam:   RLE: DP/PT palpable   LLE: DP/PT palpable     Skin/wounds:   Bilateral groin incisions w/ no e/o hematoma, left groin dressing with serous drainage.

## 2021-08-07 NOTE — UM Notes (Addendum)
Initial Inpatient Review 69/19:    86 year old female admitted to [facility] as inpatient on 8/23 and now POD 1 emergent TEVAR.  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  Surgery completed on 8/23 without complication.  Patient now recovering on CVICU.    Vitals:  Temp 99  HR 94  RR 30  135/67  95% on 1 liter    Labs:  WBC 9.53    Op Note:  Date of Procedure: 08/06/2021  Procedure:   TEVAR [MDT Val Cap 42 x 42 x 150 mm; 42 x 42 x 100 mm;  42 x 38 x 150 mm; 38 x 38 x 100 mm endografts]  USG guided access and device closure of the R CFA [> 12 Fr] [84132] with an 18 Fr Manta device  Aortography: non-selective  IVUS: non-coronary  Rad S and I: TEVAR  Rad S and I: USG  Rad S and I: IVUS  CV Surgeon: Lorin Picket, MD  Vascular Surgeon Valera Castle and Access]: D. Marcy Panning, MD  IR: Ruthine Dose, MD  PreopDx: Aneurysmal chronic type IIIB AD  PostopDx: Same  Description: The patient was prepped and draped in the usual sterile fashion. The L CFA was accessed with a Micropuncture needle under USG and cannulated with a 6 Fr sheath. The vessel depth was assessed with the [facility] device and then re-cannulated with an 11 Fr sheath. A Bentson wire was advanced into the ascending aorta through a vertebral cathter and exchanged for a Lunderquist wire. The R CFA was accessed with a Micropuncture needle under USG guidance and cannulated with a 6 Fr sheath. A marker pig-tail was then advanced into the ascending aorta over a Glide Wire. The patient was heparinized and a Volcano IVUS probe was advanced from the L CFA into the arch. The wires were within the TL along their entire courses. The IVUS prove and sheath were exchanged for a MDT Val Cap 42 x 42 x 150 mm endograft, which was advanced into the mid-arch. All parallax was removed from the device and an arch aortogram was performed. The distal margin of the L SCA was marked on the screen and the device was deployed flush with this mark. Post deployment  aortography demonstrated no visible IA endoleak. We the re-positioned the pig-tail in zone V and performed a CO2 aortogram. The celiac was marked on the screen and three additional components were deployed in the following order: MDT Val Cap 42 x 42 x 100 mm; 42 x 38 x 150 mm and 38 x 38 x 100 mm. Completion CO2 visceral aortography demonstrated no visible IB with good filling of the celiac and SMA. The device was withdrawn over the wire and an 18 Fr Manta device was deployed with good hemostatic effect. CO2 left ileo-femoral arteriography demonstrated no stenosis and good run-off. The R CFA sheath was removed and post-closed with a single Proglide. There were no evident complications.     PMH: Atrophic kidney, CAD (coronary artery disease), Dissection of thoracic aorta, Humerus fracture (05/2021), Iron deficiency anemia, and TIA (transient ischemic attack) (07/01/2001).    Plan:    Neuro: Risk for [name] delirium, acute pain control needs, history of gout   - Adhere to day/night cycles and administer constant re-orientation.   - CAM-[name] monitoring with early initiation of atypical antipsychotics as needed for control of agitated delirium.    - OOBTC with PT/OT consult as indicated.    - Tylenol 650 mg p.o. as  needed for control of mild to moderate pain.     CV: Type a aortic dissection status post Respaire now with expanding type IIIb aortic aneurysm and chronic dissection status post TEVAR   - MAP > 65 mmHg, vasopressors as needed to achieve.   - POC bedside echocardiography for assessment of dynamic cardiac status and titration of potential inotropic therapy.   - Telemetry monitoring with EKG as needed for arrhythmia indication.    - Spinal drain in place, clamp and removed per protocol.   - Aspirin 81 mg p.o. daily.   - Atorvastatin 40 mg p.o. nightly.   - Digoxin 0.125 mg p.o. daily.      Pulm: Risk for hypoxia   - SpO2 goal greater than 88%, which correlates to PaO2 greater than 55 mmHg.   - PaCO2 goal 35 to 45  mmHg, which corresponds to pH 7.35-7.45.    - Head of bed greater than 30 degrees and aggressive pulmonary toileting.       GI: Risk for constipation, risk for malnutrition, risk for peptic ulcer disease  - Famotidine 20 mg p.o. every 12.   - Miralax 17 gm p.o. daily.    - Senna/Colace 8.6/50 mg 2 tablet p.o. nightly.   - P.o. diet.      GU: History of atrophic kidney, risk for acute kidney injury, hypokalemia, volume overload   - Renally dose all medications and avoid nephrotoxins.   - Electrolyte repletion per protocol.  - Bedside echo for POC assessment of volume status and guidance of volume resuscitation.   - Lasix 40 mg p.o. daily.      Endo: Hyperglycemia, hypothyroidism   - Insulin gtt for BG goal 120-180 mg/dL.  Synthroid 25 mcg p.o. daily.      Heme: Risk for DVTs, iron deficient anemia, thrombocytopenia   - Transfuse for Hgb goal > 7.0 g/dL.   - Transfuse for plt goal > 40 K/mcL.   - SCDs in place for DVT prevention.      ID: Risk for iatrogenic infections   - No evidence infection at the present time, no requirement for antibiotics.   - De-escalation of indwelling tubes, catheters, and vascular access lines.     Current Facility-Administered Medications   Medication Dose Route Frequency Last Rate Last Admin    0.9% NaCl infusion   Intravenous Continuous 10 mL/hr at 08/07/21 1500 Rate Verify at 08/07/21 1500    acetaminophen (TYLENOL) tablet 650 mg  650 mg Oral Q4H PRN        albuterol (PROVENTIL) (2.5 MG/3ML) 0.083% nebulizer solution 2.5 mg  2.5 mg Nebulization Q6H PRN        allopurinol (ZYLOPRIM) tablet 300 mg  300 mg Oral Daily   300 mg at 08/07/21 0854    aspirin EC tablet 81 mg  81 mg Oral Daily   81 mg at 08/07/21 1020    atorvastatin (LIPITOR) tablet 40 mg  40 mg Oral QHS        bisacodyl (DULCOLAX) suppository 10 mg  10 mg Rectal Daily PRN        chlorhexidine (PERIDEX) 0.12 % solution 15 mL  15 mL Mouth/Throat Q12H SCH   15 mL at 08/07/21 0854    dexmedeTOMIDine (PRECEDEX) 400 mcg in sodium  chloride 0.9 % 100 mL infusion (premix)  0.1-0.5 mcg/kg/hr Intravenous Continuous        digoxin (LANOXIN) tablet 0.125 mg  0.125 mg Oral Daily   0.125 mg at 08/07/21 1610  famotidine (PEPCID) tablet 20 mg  20 mg Oral Daily   20 mg at 08/07/21 0854    furosemide (LASIX) tablet 40 mg  40 mg Oral Daily   40 mg at 08/07/21 0854    labetalol (NORMODYNE,TRANDATE) 500 mg in sodium chloride 0.9 % 250 mL infusion  0-3 mg/min Intravenous Continuous PRN        lactated ringers bolus 250 mL  250 mL Intravenous PRN        latanoprost (XALATAN) 0.005 % ophthalmic solution 1 drop  1 drop Both Eyes Daily   1 drop at 08/07/21 1206    levothyroxine (SYNTHROID) tablet 25 mcg  25 mcg Oral Daily at 0600   25 mcg at 08/07/21 1020    mupirocin (BACTROBAN NASAL) 2 % ointment   Nasal Q12H SCH   0.9 each at 08/07/21 0854    niCARdipine (CARDENE) 25 mg in sodium chloride 0.9 % 100 mL infusion mini-bag plus  0-15 mg/hr Intravenous Continuous PRN        norepinephrine (LEVOPHED) 8 mg in dextrose 5% 250 mL infusion  1-20 mcg/min Intravenous Continuous PRN        ondansetron (ZOFRAN-ODT) disintegrating tablet 4 mg  4 mg Oral Q8H PRN        Or    ondansetron (ZOFRAN) injection 4 mg  4 mg Intravenous Q8H PRN        polyethylene glycol (MIRALAX) packet 17 g  17 g Oral Daily   17 g at 08/07/21 0853    senna-docusate (PERICOLACE) 8.6-50 MG per tablet 1 tablet  1 tablet Oral QHS        traMADol (ULTRAM) tablet 50 mg  50 mg Oral Q4H PRN         08/06/21 1641  Admit to Inpatient  Once        Diagnosis: Aneurysm Of Descending Thoracic Aorta            UTILIZATION REVIEW CONTACT: Name: Diona Browner, RN, CM  Clinical Case Manager  - Utilization Review  [facility]  Address:  [address] [place], [place]  [address]  NPI:   [id number]  Tax ID:  [id number]  Direct: [phone] (Voicemail only)  Main: [phone]  Fax: [phone]  Email: [email or web address]     Please use fax number [phone] to provide authorization for  hospital services or to request additional information.

## 2021-08-07 NOTE — Progress Notes (Signed)
[facility]- CVICU  Medical Critical Care Service [facility]) Progress Note        Date / Time: 08/08/2211:42 PM Room:   FI206/FI206-01  Patient:   Jessica, [name]   Admitted: 08/06/2021  Attending:   Halford Decamp, MD   Status: Full Code     Critical Care PROGRESS Note --  Hospital Day /  LOS: 1 day         Assessment:   Jessica [name] is a 86 y.o. female with a past medical history of atrophic kidney, CAD, thoracic aortic dissection, humerus fracture June 2022, iron deficient anemia, and TIA 07/01/2001 who presented for an expanding aortic aneurysm and chronic dissection and underwent a TEVAR 08/06/2021.     Patient Active Problem List    Diagnosis Date Noted    AAA (abdominal aortic aneurysm) without rupture 08/06/2021    Aneurysm of descending thoracic aorta 08/06/2021       Key Events:     08/06/2021: TEVAR     Last 24 Hours:     TEVAR     Focused Plan:     Neuro: Risk for [name] delirium, acute pain control needs, history of gout   - Adhere to day/night cycles and administer constant re-orientation.   - CAM-[name] monitoring with early initiation of atypical antipsychotics as needed for control of agitated delirium.    - OOBTC with PT/OT consult as indicated.    - Tylenol 650 mg p.o. as needed for control of mild to moderate pain.    CV: Type a aortic dissection status post Respaire now with expanding type IIIb aortic aneurysm and chronic dissection status post TEVAR   - MAP > 65 mmHg, vasopressors as needed to achieve.   - POC bedside echocardiography for assessment of dynamic cardiac status and titration of potential inotropic therapy.   - Telemetry monitoring with EKG as needed for arrhythmia indication.    - Spinal drain in place, clamp and removed per protocol.   - Aspirin 81 mg p.o. daily.   - Atorvastatin 40 mg p.o. nightly.   - Digoxin 0.125 mg p.o. daily.     Pulm: Risk for hypoxia   - SpO2 goal greater than 88%, which correlates to PaO2 greater than 55 mmHg.   - PaCO2 goal 35 to 45 mmHg, which  corresponds to pH 7.35-7.45.    - Head of bed greater than 30 degrees and aggressive pulmonary toileting.      GI: Risk for constipation, risk for malnutrition, risk for peptic ulcer disease  - Famotidine 20 mg p.o. every 12.   - Miralax 17 gm p.o. daily.    - Senna/Colace 8.6/50 mg 2 tablet p.o. nightly.   - P.o. diet.     GU: History of atrophic kidney, risk for acute kidney injury, hypokalemia, volume overload   - Renally dose all medications and avoid nephrotoxins.   - Electrolyte repletion per protocol.  - Bedside echo for POC assessment of volume status and guidance of volume resuscitation.   - Lasix 40 mg p.o. daily.     Endo: Hyperglycemia, hypothyroidism   - Insulin gtt for BG goal 120-180 mg/dL.  Synthroid 25 mcg p.o. daily.     Heme: Risk for DVTs, iron deficient anemia, thrombocytopenia   - Transfuse for Hgb goal > 7.0 g/dL.   - Transfuse for plt goal > 40 K/mcL.   - SCDs in place for DVT prevention.     ID: Risk for iatrogenic infections   -  No evidence infection at the present time, no requirement for antibiotics.   - De-escalation of indwelling tubes, catheters, and vascular access lines.     Code Status: Full Code     Physical Exam:   BP 133/64    Pulse 87    Temp 99 F (37.2 C) (Oral)    Resp 15    Ht 1.626 m (5\' 4" )    Wt 57.2 kg (126 lb 1.7 oz)    SpO2 95%    BMI 21.65 kg/m      Physical Exam  Vitals and nursing note reviewed.   Constitutional:       General: She is not in acute distress.     Appearance: She is not toxic-appearing.   HENT:      Head: Normocephalic.      Mouth/Throat:      Mouth: Mucous membranes are moist.   Cardiovascular:      Rate and Rhythm: Normal rate.   Pulmonary:      Effort: Pulmonary effort is normal. No respiratory distress.   Musculoskeletal:         General: No deformity.   Neurological:      Mental Status: She is alert.     Total time spent: 39 minutes.     Signed by: Julieanne Manson, MD  08/07/2021 12:42 PM  cc:Pcp, None, MD

## 2021-08-07 NOTE — Anesthesia Postprocedure Evaluation (Signed)
Anesthesia Post Evaluation    Patient: Jessica [name]    Procedure(s):  TEVAR    Anesthesia type: general    Last Vitals:   Vitals Value Taken Time   BP 129/61 08/07/21 1000   Temp 36.7 C (98.1 F) 08/07/21 0800   Pulse 87 08/07/21 1000   Resp 24 08/07/21 1000   SpO2 99 % 08/07/21 1000                 Anesthesia Post Evaluation:     Patient Evaluated: [name]    Level of Consciousness: awake and alert    Pain Management: adequate    Airway Patency: patent    Anesthetic complications: No      PONV Status: none    Cardiovascular status: acceptable  Respiratory status: acceptable  Hydration status: acceptable        Signed by: Meryl Crutch, MD, 08/07/2021 10:37 AM

## 2021-08-07 NOTE — Progress Notes (Signed)
Interventional Radiology  PROGRESS NOTE    Date Time: 08/07/21 12:04 PM  Patient Name: [name]      Assessment:   Jessica [name] is am 86 year old female with PMH of  CAD, TIA, chronic dissection of thoracic aorta and descending thoracic aortic aneurysm now s/p TEVAR from origin of left subclavian artery to just above celiac artery on 8/24.    Bilateral groins remain soft with no evidence of hematoma, oozing, erythema, swelling or tenderness to palpation. Bilateral DP/PT pulses 2+.     Plan:   - Continue to monitor bilateral groin access sites for any s/s of infection and e/o hematoma or bleeding.  - Continue neurovascular checks.   - Remainder of care per primary team    There are no current IR needs. Thank you for allowing [place] to be a part of your patients care. Please call with questions or concerns.    Subjective:   Patient with no complaints. States she's tired    Medications:     Current Facility-Administered Medications   Medication Dose Route Frequency    allopurinol  300 mg Oral Daily    aspirin EC  81 mg Oral Daily    atorvastatin  40 mg Oral QHS    chlorhexidine  15 mL Mouth/Throat Q12H SCH    digoxin  0.125 mg Oral Daily    famotidine  20 mg Oral Daily    furosemide  40 mg Oral Daily    latanoprost  1 drop Both Eyes Daily    levothyroxine  25 mcg Oral Daily at 0600    mupirocin   Nasal Q12H SCH    polyethylene glycol  17 g Oral Daily    senna-docusate  1 tablet Oral QHS         Physical Exam:     Vitals:    08/07/21 1200   BP: 133/64   Pulse: 87   Resp: 15   Temp:    SpO2: 100%       General: WD/WN 86  y/o female, in NAD, laying comfortable in bed  HEENT: atraumatic, anicteric, neck supple  Neuro: A&O x3, moves all extremities spontaneously  CV: RRR, 2+ b/l DP/PT pulses  Pulm: normal respiratory effort  Skin: warm, intact. Bilateral groin access sites with no e/o hematoma, oozing, erythema, swelling or tenderness to palpation with R dressing c/d/I and L dressing with small amount of old blood    I  & O     Intake and Output Summary (Last 24 hours) at Date Time    Intake/Output Summary (Last 24 hours) at 08/07/2021 1204  Last data filed at 08/07/2021 1100  Gross per 24 hour   Intake 3442.08 ml   Output 1805.17 ml   Net 1636.91 ml         Labs:     Lab Results   Component Value Date/Time    WBC 9.53 (H) 08/07/2021 07:25 AM    HCT 25.1 (L) 08/07/2021 07:25 AM    PLT 152 08/07/2021 07:25 AM    INR 1.1 08/07/2021 07:25 AM    PT 13.5 (H) 08/07/2021 07:25 AM    PTT 32 08/07/2021 07:25 AM    BUN 19.0 08/07/2021 02:17 AM    CREAT 1.1 (H) 08/07/2021 02:17 AM    GLU 117 (H) 08/07/2021 02:17 AM    K 4.1 08/07/2021 02:17 AM        Rads:     Radiology Results (24 Hour)  Procedure Component Value Units Date/Time    X-ray chest AP portable (daily) [[id number]] Collected: 08/07/21 0738    Order Status: Completed Updated: 08/07/21 0741    Narrative:      HISTORY: Aortic stent graft.     COMPARISON: 08/06/2021    FINDINGS:   Vascular congestion and edema has essentially resolved. There is  persistent retrocardiac opacity. Thoracic aorta remains dilated with  stent graft in place. There has been midline sternotomy. There is no  pneumothorax.      Impression:        Vascular congestion and edema has essentially resolved. Remainder as  above.    Wilmon Pali, MD   08/07/2021 7:39 AM    X-ray chest AP portable (once) [[id number]] Collected: 08/06/21 1859    Order Status: Completed Updated: 08/06/21 1903    Narrative:      HISTORY: Post cardiac surgery.     COMPARISON: 8/17.    FINDINGS:   New stent graft in the thoracic aorta. Sternal wires. Unchanged abnormal  mediastinal contour due to aneurysm. New pulmonary edema. No visible  pneumothorax. No definite effusion.      Impression:        1. New aortic stent graft.  2. New pulmonary edema.    Wynema Birch, MD   08/06/2021 7:01 PM    Thoracic Aortic Stent Graft (TAA) [[id number]] Collected: 08/06/21 1632    Order Status: Completed Updated: 08/06/21 1648    Narrative:      HISTORY:  86 year old female with history of type a dissection status  post repair of the ascending aorta who has a residual type B dissection  with expansion of the thoracic aorta presents for endograft placement.    KEY PORTIONS OF THE EXAMINATION:    1. Using ultrasound fluoroscopic guidance: Puncture right common femoral  artery  2. Using ultrasound fluoroscopic guidance: Puncture left common femoral  artery  3. Via the left common femoral artery: Catheterization of the ascending  aorta (DS)  4. Via the right common femoral artery: Catheterization of the ascending  aorta (DS)  5. Arch and thoracic aortogram via the right common femoral artery (DS)  6. Via the left common femoral artery Intravascular ultrasound  evaluation of the thoracic and abdominal aorta (DS)  7. Via the right common femoral artery abdominal aortogram (DS)  8. Via the left common femoral artery placement of thoracic endograft  from lateral margin of the origin of the left subclavian artery to just  above the celiac artery  9. Deployment of the mantle device left common femoral artery  10. Via the right common femoral artery pelvic angiogram (DS)  11. Deployment Proglide device right common femoral artery  12. Placement of right common femoral vein vascular sheath.    ANESTHESIA: General anesthesia; 1% lidocaine local    CONTRAST AGENT: Carbon dioxide; Visipaque 320    CONTRAST VOLUME: Carbon dioxide; 30 mL of Visipaque 320    OPERATORS:   Lorin Picket, MD cardiac surgery  Bernerd Limbo, MD vascular surgery  Deborra Medina, M.D., interventional radiology (DS)    RADIATION PARAMETERS: Fluoro time 28.6 minutes Air Kerma 496 mGy.     ESTIMATED BLOOD LOSS: 100 mL.    COMPLICATIONS: None.    PROCEDURE: The nature of the procedure, risks, benefits, and  alternatives were discussed with the patient. All questions were  answered and consent was obtained.    Patient received general anesthesia from the department of  anesthesiology. A spinal drain, right radial  arterial  line and Foley  catheter replaced.    The procedure was performed under standard sterile technique.  The area  to be accessed was cleansed with 2% chlorhexidine and the site draped  using maximal sterile barrier precautions.  A large sterile drape/broad  field sheet was applied.  Standard hand washing was performed by the  operators.  Sterile gowns and gloves were worn as were caps and masks.      Ultrasound was used to confirm patency of the right and left common  femoral arteries prior to obtaining the vessel access. The  right and  left common femoral arteries are  patent.    Using ultrasound fluoroscopic guidance, the left common femoral artery  was punctured and a guidewire advanced without difficulty. An ultrasound  image was recorded. The mantle device was used to measure the depth of  the artery. Then an 63 French vascular sheath was placed. A catheter and  guidewire were then advanced via the left common femoral artery into the  descending aorta.    Using ultrasound fluoroscopic guidance, the right common femoral artery  was punctured and a guidewire advanced without difficulty. An ultrasound  image was recorded. A vascular sheath was placed. The catheter and  guidewire were advanced into the a sending aorta. In addition, attention  was directed to the right common femoral vein raising ultrasound  guidance the vessel was punctured and a guidewire advanced without  difficulty. An ultrasound image is recorded performed. A vascular sheath  was placed.    Via the left common femoral artery intravascular ultrasound was  performed to evaluate the abdominal and thoracic aortas. The origin of  the left renal artery, celiac artery and superior mesenteric artery were  identified as well as the true and false lumen in the origin of the left  subclavian artery. The intravascular ultrasound and guidewire from the  contralateral leg were in the true lumen.    Via the right common femoral artery a pigtail  catheter was placed over  the guidewire into the ascending aorta. Via the left common femoral  artery the Medtronic Valliant endograft was positioned at the arch. An  arch aortogram was performed from the pigtail catheter in the right  common femoral artery and the great vessel origins identified. The  proximal portion of the thoracic endograft was positioned along the  lateral margin of the left subclavian artery and successfully deployed.  A pigtail catheter was then placed back into the ascending aorta and  aortogram performed demonstrating excellent position of the graft with  widely patent great vessels including the left subclavian artery. Via  the left common femoral artery a second endograft component was placed  overlapping the first component. Pigtail catheter was placed to the  diaphragm and a CO2 angiogram performed localizing the origin of the  celiac and superior mesenteric arteries. Third endograft component was  placed via the left common femoral artery and deployed in overlapping  fashion. Finally a fourth endograft component was placed so that the  bottom portion was just above the origin of the celiac artery  overlapping the indwelling endograft. A repeat angiogram demonstrated an  excellent result.    The conclusion of the procedure the Mantoux device was deployed in the  left common femoral artery and a pelvic angiogram performed via the  right common femoral artery demonstrating no evidence of vessel  occlusion or stenosis.    The sheath was removed from the right common femoral artery and  successful alignment  of the Proglide device was performed.    The patient targeted the procedure without immediate complication was  transferred to the intensive care unit in stable condition.     Medtronic valiant endograft   [id number]  [id number]  [id number]  [id number]      Impression:          1. Successful placement of 4 components of a Medtronic valiant thoracic  endograft (TEVAR) via  the left common femoral artery with the most  proximal portion positioned at the lateral margin of the left subclavian  artery and the distal portion of the lowest endograft just above the  celiac artery.    Deborra Medina, MD   08/06/2021 4:46 PM             Donnie Coffin, PA-C  Interventional Radiology  [facility]  Spectralink/Inpatient consults 7:30am-5pm Monday-Friday: [phone]   After hours office ph: [phone], fax: [phone]  Available on [employer]

## 2021-08-07 NOTE — Brief Op Note (Signed)
BRIEF OP NOTE    Date Time: 08/07/21 12:04 PM    Patient Name:   Jessica [name]    Date of Operation:   08/06/2021    Providers Performing:   Surgeon(s):  Halford Decamp, MD  Sandrea Hughs, MD  Otilio Miu, MD    Assistant (s):   * No surgical staff found *    Operative Procedure:   Procedure(s):  TEVAR    Preoperative Diagnosis:   Pre-Op Diagnosis Codes:     * TIA (transient ischemic attack) [G45.9]    Postoperative Diagnosis:   Post-Op Diagnosis Codes:     * TIA (transient ischemic attack) [G45.9]    Anesthesia:   General    Estimated Blood Loss:    * No values recorded between 08/06/2021  1:23 PM and 08/06/2021  4:31 PM *    Implants:     Implant Name Type Inv. Item Serial No. Manufacturer Lot No. LRB No. Used Action   GRAFT VLNT FF CW [id number] - [id number] Graft GRAFT VLNT FF CW [id number] [employer] [id number] N/A 1 Implanted   GRAFT VLNT FF CW [id number] - [id number] Graft GRAFT VLNT FF CW [id number] [employer] [id number] N/A 1 Implanted   GRAFT VALIANT FFS [id number] - [id number] Graft GRAFT VALIANT FFS [id number]  [employer] [id number] N/A 1 Implanted   GRAFT VLNT FF CW [id number] - [id number] Graft GRAFT VLNT FF CW [id number]  [employer] [id number] N/A 1 Implanted       Drains:   Drains: no    Specimens:   * No specimens in log *      Findings:   No proximal endoleak identified    Complications:   None evident      Signed by: Halford Decamp, MD                                                                           [facility] CARDI[facility] CATH

## 2021-08-08 LAB — BASIC METABOLIC PANEL
Anion Gap: 9 (ref 5.0–15.0)
BUN: 18 mg/dL (ref 7.0–19.0)
CO2: 26 mEq/L (ref 22–29)
Calcium: 8.5 mg/dL (ref 7.9–10.2)
Chloride: 101 mEq/L (ref 100–111)
Creatinine: 1.2 mg/dL — ABNORMAL HIGH (ref 0.6–1.0)
Glucose: 142 mg/dL — ABNORMAL HIGH (ref 70–100)
Potassium: 3.9 mEq/L (ref 3.5–5.1)
Sodium: 136 mEq/L (ref 136–145)

## 2021-08-08 LAB — CBC AND DIFFERENTIAL
Absolute NRBC: 0 10*3/uL (ref 0.00–0.00)
Basophils Absolute Automated: 0.02 10*3/uL (ref 0.00–0.08)
Basophils Automated: 0.1 %
Eosinophils Absolute Automated: 0.02 10*3/uL (ref 0.00–0.44)
Eosinophils Automated: 0.1 %
Hematocrit: 23 % — ABNORMAL LOW (ref 34.7–43.7)
Hgb: 7 g/dL — ABNORMAL LOW (ref 11.4–14.8)
Immature Granulocytes Absolute: 0.08 10*3/uL — ABNORMAL HIGH (ref 0.00–0.07)
Immature Granulocytes: 0.6 %
Lymphocytes Absolute Automated: 1.4 10*3/uL (ref 0.42–3.22)
Lymphocytes Automated: 10.4 %
MCH: 20.8 pg — ABNORMAL LOW (ref 25.1–33.5)
MCHC: 30.4 g/dL — ABNORMAL LOW (ref 31.5–35.8)
MCV: 68.2 fL — ABNORMAL LOW (ref 78.0–96.0)
Monocytes Absolute Automated: 0.96 10*3/uL — ABNORMAL HIGH (ref 0.21–0.85)
Monocytes: 7.2 %
Neutrophils Absolute: 10.93 10*3/uL — ABNORMAL HIGH (ref 1.10–6.33)
Neutrophils: 81.6 %
Nucleated RBC: 0 /100 WBC (ref 0.0–0.0)
Platelets: 115 10*3/uL — ABNORMAL LOW (ref 142–346)
RBC: 3.37 10*6/uL — ABNORMAL LOW (ref 3.90–5.10)
RDW: 19 % — ABNORMAL HIGH (ref 11–15)
WBC: 13.41 10*3/uL — ABNORMAL HIGH (ref 3.10–9.50)

## 2021-08-08 LAB — MAGNESIUM: Magnesium: 1.9 mg/dL (ref 1.6–2.6)

## 2021-08-08 LAB — GFR: EGFR: 42.8

## 2021-08-08 MED ORDER — POTASSIUM CHLORIDE 20 MEQ PO PACK
20.0000 meq | PACK | Freq: Once | ORAL | Status: AC
Start: 2021-08-08 — End: 2021-08-08
  Administered 2021-08-08: 18:00:00 20 meq via ORAL
  Filled 2021-08-08: qty 1

## 2021-08-08 MED ORDER — MAGNESIUM SULFATE IN D5W 1-5 GM/100ML-% IV SOLN
1.0000 g | INTRAVENOUS | Status: DC | PRN
Start: 2021-08-08 — End: 2021-08-10

## 2021-08-08 MED ORDER — FERROUS SULFATE 324 (65 FE) MG PO TBEC
324.0000 mg | DELAYED_RELEASE_TABLET | Freq: Every morning | ORAL | Status: DC
Start: 2021-08-08 — End: 2021-08-10
  Administered 2021-08-08 – 2021-08-10 (×3): 324 mg via ORAL
  Filled 2021-08-08 (×3): qty 1

## 2021-08-08 MED ORDER — MAGNESIUM SULFATE IN D5W 1-5 GM/100ML-% IV SOLN
1.0000 g | Freq: Once | INTRAVENOUS | Status: AC
Start: 2021-08-08 — End: 2021-08-08
  Administered 2021-08-08: 18:00:00 1 g via INTRAVENOUS
  Filled 2021-08-08: qty 100

## 2021-08-08 MED ORDER — HEPARIN SODIUM (PORCINE) 5000 UNIT/ML IJ SOLN
5000.0000 [IU] | Freq: Three times a day (TID) | INTRAMUSCULAR | Status: DC
Start: 2021-08-08 — End: 2021-08-10
  Administered 2021-08-08 – 2021-08-09 (×3): 5000 [IU] via SUBCUTANEOUS
  Filled 2021-08-08 (×4): qty 1

## 2021-08-08 MED ORDER — POTASSIUM CHLORIDE 10 MEQ/100ML IV SOLN
10.0000 meq | INTRAVENOUS | Status: DC | PRN
Start: 2021-08-08 — End: 2021-08-10

## 2021-08-08 MED ORDER — MAGNESIUM OXIDE 400 MG TABS (WRAP)
0.0000 mg | ORAL_TABLET | ORAL | Status: DC | PRN
Start: 2021-08-08 — End: 2021-08-10

## 2021-08-08 MED ORDER — POTASSIUM CHLORIDE CRYS ER 20 MEQ PO TBCR
0.0000 meq | EXTENDED_RELEASE_TABLET | ORAL | Status: DC | PRN
Start: 2021-08-08 — End: 2021-08-10

## 2021-08-08 NOTE — Plan of Care (Signed)
Problem: Compromised Tissue integrity  Goal: Damaged tissue is healing and protected  Outcome: Progressing  Goal: Nutritional status is improving  Outcome: Progressing     Problem: Moderate/High Fall Risk Score >5  Goal: Patient will remain free of falls  Outcome: Progressing     Problem: Safety  Goal: Patient will be free from injury during hospitalization  Outcome: Progressing  Goal: Patient will be free from infection during hospitalization  Outcome: Progressing     Problem: Pain  Goal: Pain at adequate level as identified by patient  Outcome: Progressing     Problem: Side Effects from Pain Analgesia  Goal: Patient will experience minimal side effects of analgesic therapy  Outcome: Progressing     Problem: Discharge Barriers  Goal: Patient will be discharged home or other facility with appropriate resources  Outcome: Progressing     Problem: Psychosocial and Spiritual Needs  Goal: Demonstrates ability to cope with hospitalization/illness  Outcome: Progressing     Problem: Post-op Phase - Cardiac Surgery  Goal: Effective breathing pattern is maintained  Outcome: Progressing  Goal: Patient will remain free from post-op complications  Outcome: Progressing  Goal: Mobility/activity is maintained at optimal level  Outcome: Progressing     Problem: Peripheral Neurovascular Impairment  Goal: Extremity color, movement, sensation are maintained or improved  Outcome: Progressing

## 2021-08-08 NOTE — Progress Notes (Signed)
Pt transfer from CVICU via chair. VSS on 1L 02.     4 eyes in 4 hours pressure injury assessment note:      Completed with: Suzette Battiest, RN  Unit & Time admitted: CVSD 2230            Bony Prominences: Check appropriate box; if wound is present enter wound assessment in LDA     Occiput:                 [x] WNL  []  Wound present  Face:                     [x] WNL  []  Wound present  Ears:                      [x] WNL  []  Wound present  Spine:                    [x] WNL  []  Wound present  Shoulders:             [x] WNL  []  Wound present  Elbows:                  [x] WNL  []  Wound present  Sacrum/coccyx:     [x] WNL  []  Wound present  Ischial Tuberosity:  [x] WNL  []  Wound present  Trochanter/Hip:      [x] WNL  []  Wound present  Knees:                   [x] WNL  []  Wound present  Ankles:                   [x] WNL  []  Wound present  Heels:                    [x] WNL  []  Wound present  Other pressure areas:  []  Wound location       Device related: []  Device name:         LDA completed if wound present: yes/no  Consult WOCN if necessary    Other skin related issues, ie tears, rash, etc, document in Integumentary flowsheet

## 2021-08-08 NOTE — Progress Notes (Signed)
Vascular Surgery Progress Note  [id number]    Date Time: 08/08/21 12:17 PM  Patient Name: Jessica [name]  Attending Vascular Physician: Dr. Billie Lade Day: 3     Interval History:    Surgery: Date: 08/06/2021  Procedure:TEVAR    Interval events:   No acute events over night   Tmax 100.1, Tachycardiac (90-100) , 1 L O2 NC SpO2 96%- 99%    Assessment:   Jessica [name] is a 86 y.o. female with a past medical history of atrophic kidney, CAD,  thoracic aortic dissection, humerus fracture June 2022, iron deficient anemia, and TIA 07/01/2001 who presented for an expanding aortic aneurysm and chronic dissection now s/p TEVAR 08/06/2021. Doing well postoperatively.    Plan:    No further Vascular interventions planned at this time   ASA 81 mg   Multimodal pain control regimen  Monitor bilateral groin sites  DVT ppx: SCDs in place   Remainder of care per primary team    Labs:      Hematology   Recent Labs     08/07/21  0725 08/07/21  0217 08/06/21  1758   WBC 9.53* 8.01 9.81*   Hgb 7.5* 7.7* 7.3*   Hematocrit 25.1* 25.8* 25.1*   Platelets 152 122* 141*        Coagulation   Recent Labs     08/07/21  0725 08/06/21  1758   PT 13.5* 14.2*   PT INR 1.1 1.2*   PTT 32 33       Chemistry   Recent Labs     08/07/21  0217 08/06/21  1758   Sodium 137 138   Potassium 4.1 3.9   Chloride 108 105   CO2 24 26   BUN 19.0 18.0   Creatinine 1.1* 1.0   Glucose 117* 116*   Calcium 8.5 8.3   Magnesium 2.1 1.8        Liver Function Tests   Recent Labs     08/07/21  0217   AST (SGOT) 17   ALT 8   Alkaline Phosphatase 57   Protein, Total 7.1   Albumin 2.2*   Bilirubin, Total 0.8   Bilirubin Direct 0.4           Medications:     Current Facility-Administered Medications   Medication Dose Route Frequency    allopurinol  300 mg Oral Daily    aspirin EC  81 mg Oral Daily    atorvastatin  40 mg Oral QHS    digoxin  0.125 mg Oral Daily    famotidine  20 mg Oral Daily    furosemide  40 mg Oral Daily    latanoprost  1 drop Both Eyes Daily    levothyroxine   25 mcg Oral Daily at 0600    mupirocin   Nasal Q12H SCH    polyethylene glycol  17 g Oral Daily    senna-docusate  1 tablet Oral QHS         acetaminophen, albuterol, bisacodyl, ondansetron **OR** ondansetron, traMADol     Input/output:      Patient Lines/Drains/Airways Status       Active Lines, Drains and Airways       Name Placement date Placement time Site Days    Peripheral IV 08/06/21 18 G Right Antecubital 08/06/21  1026  Antecubital  2    Peripheral IV 08/06/21 18 G Anterior;Right Wrist 08/06/21  1026  Wrist  2  Review of Systems:    As per HPI, stated above    Physical Exam:    Current Vitals:   Vitals:    08/08/21 0802   BP: 118/58   Pulse: 93   Resp: 16   Temp: 98.3 F (36.8 C)   SpO2: 99%       Vital signs in the last 24hrs:   Temp:  [97.3 F (36.3 C)-100.1 F (37.8 C)] 98.3 F (36.8 C)  Heart Rate:  [90-110] 93  Resp Rate:  [16-29] 16  BP: (101-137)/(54-63) 118/58  Arterial Line BP: (92-129)/(43-59) 112/49     General: NAD, resting in bed comfortably   Neuro: A&O x 3 speech clear and congruent, face symmetric, tongue midline  Cardio: RRR   Pulm: unlabored breathing, 1 L NC  Abd: soft, ND, NTTP     Ext:    Warm   Motor: intact   Sensory: intact     Pulse Exam:   RLE: DP/PT palpable   LLE: DP/PT palpable     Skin/wounds:   Bilateral groin incisions w/ no e/o hematoma

## 2021-08-08 NOTE — PT Eval Note (Signed)
[facility]   Physical Therapy Evaluation   Patient: Jessica [name]    MRN#: [id number]   [facility]  Bed: FI251/FI251-01     POST ACUTE CARE THERAPY RECOMMENDATIONS:      Discharge Recommendation: Acute Rehab    Patient is very motivated with physical therapy and will tolerate 3 hours of therapy per day, 5 days a week. Recommend discharge to acute rehab in order to maximize independence prior to returning home.     Milestones to be reached to achieve recommendation: none      DME Recommendations: TBD at rehab    If the above recommended discharge disposition is not available, patient will need Moderate assistance for functional mobility, Bedside commode, Shower chair, Wheelchair, and Medical transport into home, and HHPT.     Therapy discharge recommendations may change with patient status. Please refer to most recent note for up-to-date recommendations.     ASSESSMENT:     Significant Findings: none    Jessica [name] is a 86 y.o. female admitted 08/06/2021.  Patient seated in a bedside chair upon arrival, amenable to physical therapy evaluation. Patient performed most functional mobility with Mod assist, standing with HHA on RUE. Patient ambulated 46ft fwd/back, very unsteady on feet with slight bilateral knee buckling. Patient reported feeling very tired today, limiting mobility. LUE sling in place, per patient's son, patient is "not allowed to do anything with left arm." Assuming NWB LUE at this time. Patient will continue to benefit from inpatient PT services to maximize functional mobility and independence.    Impairments: decreased activity tolerance, gait impairment, decreased strength, impaired balance, impaired bed mobility, decreased stamina, and pain    Therapy Diagnosis: impaired functional mobility    Rehabilitation Potential: good    Treatment Activities: Evaluation, gait training    Educated the patient to role of physical therapy, plan of care, goals of therapy and  safety with mobility and ADLs, HEP.     PLAN:   Treatment/Interventions: Exercise, Stair training, Gait training, Neuromuscular re-education, LE strengthening/ROM, Functional transfer training, Endurance training, Cognitive reorientation, Patient/family training, Equipment eval/education, Bed mobility     PT Frequency: 4-5x/wk   Risks/Benefits/POC Discussed with Pt/Family: With patient/family        Precautions and Contraindications:   Falls  Weight Bearing: Left UE Non weight bearing (humeral fracture from June 2022)    Consult received for Jessica [name] for PT Evaluation and Treatment.  Patient's medical condition is appropriate for Physical therapy intervention at this time.     HISTORY OF PRESENT ILLNESS:   Per H&P:  Jessica [name] is a 86 y.o. female admitted on 08/06/2021 s/p TEVAR.    Procedure(s):  TEVAR    2 Days Post-Op  -------------------     Medical Diagnosis: TIA (transient ischemic attack) [G45.9]  AAA (abdominal aortic aneurysm) without rupture [I71.4]  Aneurysm of descending thoracic aorta [I71.2]    Past Medical/Surgical History:  Past Medical History:   Diagnosis Date    Atrophic kidney     Right    CAD (coronary artery disease)     Dissection of thoracic aorta     Type A dissection    Humerus fracture 05/2021    L Arm in sling s/p fall per dtr    Iron deficiency anemia     TIA (transient ischemic attack) 07/01/2001      SOCIAL HISTORY:   Prior Level of Function:  Prior level of function: Independent with all ADLs  Baseline  activity level: community distances  DME Currently at Home: Single point cane and Front wheeled walker    Home Living Arrangements:   Living Arrangements: patient lives with daughter  Home Layout: multilevel home  Stairs to Enter: steps to enter; full flight to bed/bath     SUBJECTIVE: "I'm sleepy today"   Patient is agreeable to participation in the therapy session. Nursing clears patient for therapy.     Patient goal: return to PLOF    Pain Assessment:  No report of pain      OBJECTIVE:   Observation of Patient:   Patient with telemetry, LUE sling in place.  Patient wore mask during therapy session: No    PPE worn during session: procedural mask, goggles, and gloves    Cognition/Neuro Status:  Orientation Level: Person and Place   Safety Awareness: minimal verbal instruction    Musculoskeletal Examination:  Gross ROM  RUE: within normal limits  LUE: not tested  RLE: within normal limits  LLE: within normal limits    Gross Strength  RUE: 4/5  LUE: not tested  RLE: 4/5  LLE: 4/5    Functional Mobility:  PMP - Progressive Mobility Protocol   PMP Activity: Step 6 - Walks in Room  Distance Walked (ft) (Step 6,7): 15 Feet    Transfers:  Sit to Stand: Moderate assistance  Stand to Sit: Moderate assistance    Ambulation:  Distance: 15 Feet  Device:  None (HHA RUE)  Assist Level: Moderate assistance  Pattern: decreased pace, decreased cadence, shuffle, narrow BOS    Balance:  Static sitting: good   Dynamic sitting: good   Static standing: fair   Dynamic standing: fair     Sensation:  Right UE: intact   Left UE: intact   Right LE: intact   Left LE: intact     Participation and Activity Tolerance:  Participation Effort: good  Patient Endurance: fair    Patient left with call bell within reach, all needs met, and all questions answered    SCD's off (as found)  Floor mat in place  Chair alarm in place    Goals:   Goals  Goal Formulation: With patient  Time for Goal Acheivement: 5 visits  Goals: Select goal  Pt Will Go Supine To Sit: with supervision  Pt Will Perform Sit to Stand: with supervision  Pt Will Ambulate: > 200 feet, with single point cane, with supervision  Pt Will Go Up / Down Stairs: 6-10 stairs, with supervision, With rail    Time of treatment:   PT Received On: 08/08/21  Start Time: 1100  Stop Time: 1130  Time Calculation (min): 30 min    Daleen Squibb, PT, DPT

## 2021-08-08 NOTE — Consults (Signed)
MIDLINE INSERTION PROCEDURE     Haapala,Shonia  08/08/2021    INDICATIONS: Therapy less than 28 days - Lab draws    Left arm fracture.    The midline procedure, risks, benefits were discussed with thepatient.  All questions were answered  patient verbalized understanding and agreed to proceed. Midline education/instructions provided to patient.    PROCEDURE DETAILS:   The patient was positioned and Ultrasound was used to confirm patency of the Right Brachial vein prior to obtaining venous access. The arm was scrubbed with 2% chlorhexidine per guidelines and a maximal sterile field was established for the patient.  The clinician was attired with cap, mask and sterile gown/gloves prior to start.     Trimable Arrow 15cm Power Midline:  A sterile cover was sheathed to the Ultrasound probe.  The vein was then revisualized and 1% lidocaine injected prior to puncture of the Right Brachial vein with a 21-gauge single-wall needle under direct sonographic guidance.  The guidewire was advanced through the needle, the needle was removed and a peel-away sheath was placed over the wire.  After measuring from the insertion site to the midline of the upper arm the catheter was trimmed to insure tip location below the axillary.  The catheter was then advanced through the peel-away sheath.  The sheath was removed.  The catheter was flushed with normal saline to confirm brisk blood return and capped.  The catheter was stabilized on the skin using a securement device.  Antimicrobial disk and sterile transparent occlusive dressing applied using aseptic technique.    Patient did tolerate the procedure well.   Catheter Type: 4Fr power Midline  Insertion Site: Right Brachial vein  Total length: 15cm  Internal Length:  15cm  External Length: 0cm  UAC: 24cm    Midline Reference #: [id number]  Midline Kit Lot#: [id number]  Midline Kit expiration date: 2022-12-14    Findings/Conclusions:  No signs of bleeding or symptoms of nerve irritation  noted at time of insertion procedure.    Tip location is below the level of the axilla in Brachial vein. Midline is ready for immediate use.    Midline is ready for immediate use    Kattleya Kuhnert Perlie Gold, RN

## 2021-08-08 NOTE — Progress Notes (Signed)
CV SURGERY PROGRESS NOTE     POD#: 2     Surgeon: Halford Decamp, MD     8/23: TEVAR FROM ORIGIN OF LEFT SUBCLAVIAN ARTERY TO JUST ABOVE CELIAC ARTERY.    OR: Gtts: none; Products: none; Rhythm: SR 70s; OR Issues: none reported                Indication for surgery: expanding aortic aneurysm and chronic dissection      PMH: 86 y.o. female  has a past medical history of Atrophic kidney, CAD (coronary artery disease), Dissection of thoracic aorta, Humerus fracture (05/2021), Iron deficiency anemia, and TIA (transient ischemic attack) (07/01/2001).     Cardiologist: note established    Past Medical History:   Diagnosis Date    Atrophic kidney     Right    CAD (coronary artery disease)     Dissection of thoracic aorta     Type A dissection    Humerus fracture 05/2021    L Arm in sling s/p fall per dtr    Iron deficiency anemia     TIA (transient ischemic attack) 07/01/2001        Key Events Summary:   08/06/21: to the OR for TEVAR, uncomplicated postop course.  Spinal drain in place.  Neuro intact.   8/24: Spinal drain removed; transferred to SDU     Assessment/Plan:     Daily Plan:  PT/OT- deconditioned   Diuresis   Place midline d/t difficult stick     Objective:     NEURO:   Waxes/wanes- hx dementia   Adhere to day/night cycles and administer constant re-orientation  Pain:  controlled   PT/OT recommendations: Acute rehab     CV:   Type a aortic dissection status post Respaire now with expanding type IIIb aortic aneurysm and chronic dissection status post TEVAR       PULM:     Current oxygen requirements: RA   Chest X-ray: 8/24: Vascular congestion and edema has essentially resolved    GI:   Nutrition: ADAT   Stress ulcer prophylaxis: Pepcid   Last BM Date: 08/06/21   Bowel Regimen routine       RENAL:   Creatinine 1.2; baseline: 1.0  Foley: removed and voiding     ID:   Afebrile, WBC: 13.41- will trend   Lines: PIV  Midline placed today     HEME:   Chronic anemia   SQH: started today   DVT prophylaxis:  SCDs    ENDO/RHEUM:   Hypothyroid- continue levothyroxine   SSI   HgbA1c: 5.0        Physical Exam:       VITAL SIGNS PHYSICAL EXAM    Vitals:    08/08/21 1555   BP: 119/63   Pulse: (!) 104   Resp: 20   Temp: 98.8 F (37.1 C)   SpO2: 100%     Temp (24hrs), Avg:99.4 F (37.4 C), Min:98.3 F (36.8 C), Max:100.1 F (37.8 C)        Intake/Output Summary (Last 24 hours) at 08/08/2021 1619  Last data filed at 08/08/2021 1400  Gross per 24 hour   Intake 360 ml   Output 1185 ml   Net -825 ml         Blood pressure goals: sys < 160   Current rhythm: SR in the 90s        Physical Exam  Neurological: Alert and oriented to person, believes we are in [place];  mood and affect normal  Cardiovascular: regular rate and rhythm, normal S1, S2, no murmurs, rubs or gallops  Lungs: clear to auscultation bilaterally, without wheezing, rhonchi, or rales  Abdomen: soft, non-tender, non-distended  normoactive bowel sounds  Extremities: no edema  Pulse: equal pulses, 4/4 symmetric  Wound: C/D/I, groins are soft,no hematoma palpated.        Objective:    Recent Labs     08/08/21  1349 08/07/21  0725 08/07/21  0217   WBC 13.41* 9.53* 8.01   Hgb 7.0* 7.5* 7.7*   Hematocrit 23.0* 25.1* 25.8*   Platelets 115* 152 122*     No results for input(s): TROPI, ISTATTROPONI, CK, CKMB, BNP, DDIMER in the last 72 hours. Recent Labs     08/08/21  1349 08/07/21  0217 08/06/21  1758   Sodium 136 137 138   Potassium 3.9 4.1 3.9   CO2 26 24 26    BUN 18.0 19.0 18.0   Creatinine 1.2* 1.1* 1.0   Glucose 142* 117* 116*   Magnesium 1.9 2.1 1.8    Estimated Creatinine Clearance: 30.1 mL/min (A) (based on SCr of 1.2 mg/dL (H)).   Recent Labs     08/07/21  0725 08/06/21  1758   PT INR 1.1 1.2*           Scheduled Meds:    allopurinol, 300 mg, Oral, Daily  aspirin EC, 81 mg, Oral, Daily  atorvastatin, 40 mg, Oral, QHS  digoxin, 0.125 mg, Oral, Daily  famotidine, 20 mg, Oral, Daily  furosemide, 40 mg, Oral, Daily  heparin (porcine), 5,000 Units, Subcutaneous,  Q8H SCH  latanoprost, 1 drop, Both Eyes, Daily  levothyroxine, 25 mcg, Oral, Daily at 0600  magnesium sulfate, 1 g, Intravenous, Once  mupirocin, , Nasal, Q12H SCH  polyethylene glycol, 17 g, Oral, Daily  potassium chloride, 20 mEq, Oral, Once  senna-docusate, 1 tablet, Oral, QHS        Continuous Infusions:         Subjective:   Thinks she is in [place]. Reports tender to groin sites. Denies chest pains or SOB      Clementeen Graham, FNP  Cardiovascular Surgery

## 2021-08-08 NOTE — Plan of Care (Signed)
Problem: Compromised Tissue integrity  Goal: Damaged tissue is healing and protected  Outcome: Progressing  Flowsheets (Taken 08/07/2021 1907 by Leone Brand, RN)  Damaged tissue is healing and protected:   Reposition patient every 2 hours and as needed unless able to reposition self   Provide wound care per wound care algorithm   Monitor/assess Braden scale every shift   Relieve pressure to bony prominences for patients at moderate and high risk   Avoid shearing injuries   Increase activity as tolerated/progressive mobility   Keep intact skin clean and dry   Use bath wipes, not soap and water, for daily bathing   Use incontinence wipes for cleaning urine, stool and caustic drainage. Foley care as needed   Monitor external devices/tubes for correct placement to prevent pressure, friction and shearing   Utilize specialty bed  Goal: Nutritional status is improving  Outcome: Progressing  Flowsheets (Taken 08/08/2021 1645)  Nutritional status is improving:   Assist patient with eating   Allow adequate time for meals  Note: Patient needs encouragement with meals       Problem: Moderate/High Fall Risk Score >5  Goal: Patient will remain free of falls  Outcome: Progressing  Flowsheets (Taken 08/08/2021 1645)  Moderate Risk (6-13): MOD-Consider a move closer to Nurses Station     Problem: Safety  Goal: Patient will be free from injury during hospitalization  Outcome: Progressing  Flowsheets (Taken 08/07/2021 1907 by Leone Brand, RN)  Patient will be free from injury during hospitalization:   Assess patient's risk for falls and implement fall prevention plan of care per policy   Use appropriate transfer methods   Ensure appropriate safety devices are available at the bedside   Include patient/ family/ care giver in decisions related to safety   Hourly rounding   Provide and maintain safe environment   Assess for patients risk for elopement and implement Elopement Risk Plan per policy  Goal: Patient will be  free from infection during hospitalization  Outcome: Progressing     Problem: Pain  Goal: Pain at adequate level as identified by patient  Outcome: Progressing  Flowsheets (Taken 08/07/2021 1907 by Leone Brand, RN)  Pain at adequate level as identified by patient:   Assess for risk of opioid induced respiratory depression, including snoring/sleep apnea. Alert healthcare team of risk factors identified.   Identify patient comfort function goal   Assess pain on admission, during daily assessment and/or before any "as needed" intervention(s)   Reassess pain within 30-60 minutes of any procedure/intervention, per Pain Assessment, Intervention, Reassessment (AIR) Cycle   Evaluate if patient comfort function goal is met   Evaluate patient's satisfaction with pain management progress   Include patient/patient care companion in decisions related to pain management as needed     Problem: Side Effects from Pain Analgesia  Goal: Patient will experience minimal side effects of analgesic therapy  Outcome: Progressing     Problem: Discharge Barriers  Goal: Patient will be discharged home or other facility with appropriate resources  Outcome: Progressing     Problem: Psychosocial and Spiritual Needs  Goal: Demonstrates ability to cope with hospitalization/illness  Outcome: Progressing  Flowsheets (Taken 08/07/2021 1907 by Leone Brand, RN)  Demonstrates ability to cope with hospitalizations/illness:   Assist patient to identify own strengths and abilities   Provide quiet environment   Encourage verbalization of feelings/concerns/expectations   Encourage patient to set small goals for self   Encourage participation in diversional activity   Include patient/ patient care companion  in decisions     Problem: Post-op Phase - Cardiac Surgery  Goal: Effective breathing pattern is maintained  Outcome: Progressing  Flowsheets (Taken 08/07/2021 1907 by Leone Brand, RN)  Effective breathing pattern is maintained:    Maintain SpO2 level per LIP order   Position patient for maximum ventilatory efficiency with HOB to a minimum of 30 degrees when hemodynamically stable   Reinforce use of ordered respiratory interventions (i.e. CPAP, BiPAP, Incentive Spirometer, Acapella)   Monitor for sleep apnea  Goal: Patient will remain free from post-op complications  Outcome: Progressing  Flowsheets (Taken 08/07/2021 1907 by Leone Brand, RN)  Patient will remain free from post-op complications:   Maintain sternal precautions as described in patient education   Maintain pericare/Foley care per protocol   Discontinue Foley within 24 hours unless otherwise ordered [facility])   VTE prevention: Administer anticoagulants and/or apply anti-embolism stockings/devices as ordered   Monitor/assess blood glucose per LIP orders   Assess dressing and reinforce or change per LIP order   Monitor blood glucose AC and HS and PRN after insulin drip has been discontinued   Assess surgical incision/wound site and treat per LIP order  Goal: Mobility/activity is maintained at optimal level  Outcome: Progressing  Flowsheets (Taken 08/07/2021 1907 by Leone Brand, RN)  Mobility/activity is maintained at optimal level:   Increase mobility as tolerated/progressive mobility   Plan activities to conserve energy. Plan rest periods.     Problem: Peripheral Neurovascular Impairment  Goal: Extremity color, movement, sensation are maintained or improved  Outcome: Progressing  Flowsheets (Taken 08/07/2021 1907 by Leone Brand, RN)  Extremity color, movement, sensation are maintained or improved:   Assess and monitor application of corrective devices (cast, brace, splint), check skin integrity   Increase mobility as tolerated/progressive mobility   Teach/review/reinforce ankle pump exercises   Assess extremity for proper alignment   VTE Prevention: Administer anticoagulant(s) and/or apply anti-embolism stockings/devices as ordered  [facility] Nursing Progress  Note    Jessica [name] is a 86 y.o. female  Admitted 08/06/2021  9:01 AM (Hospital day 2)    Indication for continued CVSD status:  Patient will be discharged to rehab    Major Shift Events:  Uneventful    Review of Systems  Neuro:  Patient alert and oriented times 2-3. Per family, patient does have some undiagnosed baseline dementia. Patient given Tramadol for pain with good effect.     Cardiac:  Patient in NSR with adequate blood pressure. Patient tolerating Digoxin.    Respiratory:  Patient weaned to room air. Patient with clear and diminished breath sounds. Patient diuresed with lasix today.     GI/GU:  Foley [place] today at 0730. Patient needs encouragement with meals. Patient voids with assist.    BM this shift?  no    Skin Assessment  Skin Integrity: Other(Comment) (surgical sites)    Braden Score: Braden Scale Score: 19 (08/08/21 0820)      LDAWs  Patient Lines/Drains/Airways Status       Active Lines, Drains and Airways       Name Placement date Placement time Site Days    Peripheral IV 08/06/21 18 G Right Antecubital 08/06/21  1026  Antecubital  2    Peripheral IV 08/06/21 18 G Anterior;Right Wrist 08/06/21  1026  Wrist  2    Midline IV 08/08/21 Right Upper Arm 08/08/21  1400  Upper Arm  less than 1  Puncture Site 08/06/21 Femoral Left Groin (Active)   Date First Assessed/Time First Assessed: 08/06/21 1443   Puncture site location: Femoral  Wound Location Orientation: Left  Location: Groin      Assessments 08/06/2021  4:15 PM 08/07/2021 10:15 PM   Site Assessment Clean;Intact;Dry;Tender Clean;Dry;Intact;Soft;Non Tender   Dressing Status Clean;Dry;Intact Intact;Dry;Old drainage   Dressing Changed New --   Drainage Amount None None       No Linked orders to display       Puncture Site 08/06/21 Femoral Right Groin (Active)   Date First Assessed/Time First Assessed: 08/06/21 1615   Puncture site location: Femoral  Wound Location Orientation: Right  Location: Groin      Assessments 08/06/2021  4:15 PM  08/07/2021 10:15 PM   Site Assessment Clean;Dry;Intact;Soft;Non Tender Clean;Dry;Intact;Soft;Non Tender   Dressing Status Dry;Clean;Intact Clean;Dry;Intact   Dressing Changed New --   Drainage Amount None None       No Linked orders to display     Psycho/Social:  Patient anxious at times.

## 2021-08-08 NOTE — Progress Notes (Signed)
Writer met with patient and son at bedside to discuss rehab's recommendation of acute rehab.  They want to discuss this with her daughter and call writer back.      Daughter [name] [phone] called back stating patient was in acute rehab once before and it was not a good experince.  Family prefers to take her home, they are agreeable to home health PT/OT/RN    Writer discussed DME recommendations:  patient has a shower chair and wheelchair.  She does not have a bedside commode, family is agreeable to having this ordered    Family is aware that her tentative discharge date is this weekend    Writer updated [facility]    Grace Blight MSW  Case Manager  [facility]  [phone]  [email or web address]

## 2021-08-08 NOTE — OT Eval Note (Signed)
[facility]   Occupational Therapy Evaluation   Patient: Jessica [name]    MRN#: [id number]   [facility]  Bed: FI251/FI251-01                                   Post Acute Care Therapy Recommendations:   Discharge Recommendations: Acute Rehab (may progress to home with further mobility)     Milestones to be reached to achieve recommendation: none  Anticipate achievement in n/a sessions    DME Recommended for Discharge:  (TBA at rehab)    If Acute Rehab (may progress to home with further mobility)  recommended discharge disposition is not available, patient will need physical assist for ADLs, functional transfers and functional mobility, w/c, BSC, shower chair, RW equipment, and HHOT.     Therapy discharge recommendations may change with patient status.  Please refer to most recent note for up-to-date recommendations.    Assessment:   Significant Findings: none    Jessica [name] is a 86 y.o. female admitted 08/06/2021 s/p TEVAR.  Patient presents with decreased ADLs, deconditioning and decreased activity tolerance. Patient is currently min A with bed mobility, min A with sit-stand transfers and min A with very short household mobility within room. Will benefit from inpatient OT services to maximize functional independence required to return to daily routine.      Therapy Diagnosis: Decreased ADLs, decreased activity tolerance and deconditioning    Rehabilitation Potential: good    Treatment Activities: Initial OT evaluation; energy conservation techniques  Educated the patient to role of occupational therapy, plan of care, goals of therapy and safety with mobility and ADLs, energy conservation techniques.    Plan:   OT Frequency Recommended: 4-5x/wk     Treatment/Interventions: ADLs, functional mobility, functional transfers, energy conservation techniques      Risks/benefits/POC discussed: yes       Precautions and Contraindications:   Falls, L humeral fracture from June 2022 (unsure of  WB status - spoke with CV NP to request ortho consult for clarification on precautions)    Consult received for Jessica [name] for OT Evaluation and Treatment.  Patient's medical condition is appropriate for Occupational Therapy intervention at this time.      History of Present Illness:    Jessica [name] is a 86 y.o. female admitted on 08/06/2021 s/p TEVAR.     Admitting Diagnosis: TIA (transient ischemic attack) [G45.9]  AAA (abdominal aortic aneurysm) without rupture [I71.4]  Aneurysm of descending thoracic aorta [I71.2]    Past Medical/Surgical History:  Past Medical History:   Diagnosis Date    Atrophic kidney     Right    CAD (coronary artery disease)     Dissection of thoracic aorta     Type A dissection    Humerus fracture 05/2021    L Arm in sling s/p fall per dtr    Iron deficiency anemia     TIA (transient ischemic attack) 07/01/2001       Imaging/Tests/Labs:  No results found.     Social History:   Prior Level of Function: Independent with ADLs/IADLs; (+) driver  Assistive Devices: none  Baseline Activity: as above  DME Currently at Home: as above  Home Living Arrangements: lives with daughter  Type of Home: 2STH  Home Layout: steps    Subjective: "I am feeling ok"    Patient is agreeable to participation in the therapy  session. Nursing clears patient for therapy.     Patient Goal: To go home  Pain:   Scale:0/10    Objective:   Patient is in bed with telemetry in place.  Pt wore mask during therapy session:No      Cognitive Status and Neuro Exam:  A/O x self; reoriented x 4; answers questions with increased time.     Musculoskeletal Examination  RUE ROM: WFL  LUE ROM: WFL (limited assessment 2/2 sling)  RLE ROM: WFL  LLE ROM: WFL    RUE Strength: WFL  LUE Strength: WFL  RLE Strength: WFL  LLE Strength: WFL      Sensory/Oculomotor Examination  Auditory: intact  Tactile: intact  Vision: intact      Activities of Daily Living  Eating: Independent  Grooming: Supervision  Bathing: NT  UE Dressing: Max A   LE  Dressing: Max A   Toileting: NT    Functional Mobility:  Supine to Sit: Min A  Sit to Stand: Min A   Transfers: Min A   Functional mobility: Min A with household mobility within room with hand held assist    PMP Activity: Step 6 - Walks in Room      Balance  Static Sitting: WFL  Dynamic Sitting: WFL  Static Standing: Min A   Dynamic Standing: Min A      Participation and Activity Tolerance  Participation Effort: good  Endurance: fair    Patient left with call bell within reach, all needs met, SCDs not in place as found, fall mat in place, chair alarm in place and all questions answered. RN notified of session outcome and patient response.       Goals:  Time For Goal Achievement: 5 visits  ADL Goals  Patient will groom self: Stand by Assist, 5 visits  Patient will dress upper body: Stand by Assist, 5 visits  Mobility and Transfer Goals  Pt will perform functional transfers: Stand by Assist, 5 visits        Executive Fucntion Goals  Pt will follow energy conservation techniques: with stand by assist, 5 visits                  PPE worn during session: procedural mask and gloves    Tech present: N/A  PPE worn by tech: N/A        Particia Lather, OTR/L   Pager [phone]         Time of treatment:   OT Received On: 08/08/21  Start Time: 0930  Stop Time: 1000  Time Calculation (min): 30 min

## 2021-08-09 LAB — CROSSMATCH PRBC, 1 UNIT
Expiration Date: 202210022359
ISBT CODE: 8400
Status: TRANSFUSED
UTYPE: AB POS

## 2021-08-09 LAB — CBC AND DIFFERENTIAL
Absolute NRBC: 0 10*3/uL (ref 0.00–0.00)
Basophils Absolute Automated: 0.03 10*3/uL (ref 0.00–0.08)
Basophils Automated: 0.2 %
Eosinophils Absolute Automated: 0.01 10*3/uL (ref 0.00–0.44)
Eosinophils Automated: 0.1 %
Hematocrit: 21.7 % — ABNORMAL LOW (ref 34.7–43.7)
Hgb: 6.3 g/dL — ABNORMAL LOW (ref 11.4–14.8)
Immature Granulocytes Absolute: 0.09 10*3/uL — ABNORMAL HIGH (ref 0.00–0.07)
Immature Granulocytes: 0.7 %
Lymphocytes Absolute Automated: 1.97 10*3/uL (ref 0.42–3.22)
Lymphocytes Automated: 15.1 %
MCH: 19.9 pg — ABNORMAL LOW (ref 25.1–33.5)
MCHC: 29 g/dL — ABNORMAL LOW (ref 31.5–35.8)
MCV: 68.7 fL — ABNORMAL LOW (ref 78.0–96.0)
Monocytes Absolute Automated: 1.12 10*3/uL — ABNORMAL HIGH (ref 0.21–0.85)
Monocytes: 8.6 %
Neutrophils Absolute: 9.82 10*3/uL — ABNORMAL HIGH (ref 1.10–6.33)
Neutrophils: 75.3 %
Nucleated RBC: 0 /100 WBC (ref 0.0–0.0)
Platelets: 149 10*3/uL (ref 142–346)
RBC: 3.16 10*6/uL — ABNORMAL LOW (ref 3.90–5.10)
RDW: 19 % — ABNORMAL HIGH (ref 11–15)
WBC: 13.04 10*3/uL — ABNORMAL HIGH (ref 3.10–9.50)

## 2021-08-09 LAB — CBC
Absolute NRBC: 0.02 10*3/uL — ABNORMAL HIGH (ref 0.00–0.00)
Hematocrit: 23.8 % — ABNORMAL LOW (ref 34.7–43.7)
Hgb: 7.2 g/dL — ABNORMAL LOW (ref 11.4–14.8)
MCH: 20.5 pg — ABNORMAL LOW (ref 25.1–33.5)
MCHC: 30.3 g/dL — ABNORMAL LOW (ref 31.5–35.8)
MCV: 67.6 fL — ABNORMAL LOW (ref 78.0–96.0)
Nucleated RBC: 0.1 /100 WBC — ABNORMAL HIGH (ref 0.0–0.0)
Platelets: 141 10*3/uL — ABNORMAL LOW (ref 142–346)
RBC: 3.52 10*6/uL — ABNORMAL LOW (ref 3.90–5.10)
RDW: 20 % — ABNORMAL HIGH (ref 11–15)
WBC: 14.28 10*3/uL — ABNORMAL HIGH (ref 3.10–9.50)

## 2021-08-09 LAB — URINALYSIS, REFLEX TO MICROSCOPIC EXAM IF INDICATED
Bilirubin, UA: NEGATIVE
Blood, UA: NEGATIVE
Glucose, UA: NEGATIVE
Ketones UA: NEGATIVE
Leukocyte Esterase, UA: NEGATIVE
Nitrite, UA: NEGATIVE
Specific Gravity UA: 1.022 (ref 1.001–1.035)
Urine pH: 6 (ref 5.0–8.0)
Urobilinogen, UA: NORMAL mg/dL (ref 0.2–2.0)

## 2021-08-09 LAB — BASIC METABOLIC PANEL
Anion Gap: 8 (ref 5.0–15.0)
BUN: 18 mg/dL (ref 7.0–19.0)
CO2: 25 mEq/L (ref 22–29)
Calcium: 8.4 mg/dL (ref 7.9–10.2)
Chloride: 101 mEq/L (ref 100–111)
Creatinine: 1.3 mg/dL — ABNORMAL HIGH (ref 0.6–1.0)
Glucose: 127 mg/dL — ABNORMAL HIGH (ref 70–100)
Potassium: 4 mEq/L (ref 3.5–5.1)
Sodium: 134 mEq/L — ABNORMAL LOW (ref 136–145)

## 2021-08-09 LAB — DIGOXIN LEVEL: Digoxin Level: 1.1 ng/mL (ref 0.5–2.0)

## 2021-08-09 LAB — MAGNESIUM: Magnesium: 2.2 mg/dL (ref 1.6–2.6)

## 2021-08-09 LAB — HEMOGLOBIN AND HEMATOCRIT, BLOOD
Hematocrit: 24.7 % — ABNORMAL LOW (ref 34.7–43.7)
Hgb: 7.7 g/dL — ABNORMAL LOW (ref 11.4–14.8)

## 2021-08-09 LAB — GFR: EGFR: 39

## 2021-08-09 MED ORDER — BISOPROLOL FUMARATE 5 MG PO TABS
5.0000 mg | ORAL_TABLET | Freq: Every day | ORAL | Status: DC
Start: 2021-08-09 — End: 2021-08-10
  Administered 2021-08-09 – 2021-08-10 (×2): 5 mg via ORAL
  Filled 2021-08-09 (×2): qty 1

## 2021-08-09 MED ORDER — FUROSEMIDE 10 MG/ML IJ SOLN
20.0000 mg | Freq: Once | INTRAMUSCULAR | Status: AC
Start: 2021-08-09 — End: 2021-08-09
  Administered 2021-08-09: 15:00:00 20 mg via INTRAVENOUS
  Filled 2021-08-09: qty 4

## 2021-08-09 MED ORDER — LACTULOSE 10 GM/15ML PO SOLN
20.0000 g | Freq: Four times a day (QID) | ORAL | Status: DC
Start: 2021-08-09 — End: 2021-08-09
  Administered 2021-08-09: 15:00:00 20 g via ORAL
  Filled 2021-08-09: qty 30

## 2021-08-09 MED ORDER — SODIUM CHLORIDE 0.9 % IV SOLN
INTRAVENOUS | Status: DC | PRN
Start: 2021-08-09 — End: 2021-08-10

## 2021-08-09 NOTE — Progress Notes (Signed)
CV SURGERY PROGRESS NOTE     POD#: 3     Surgeon: Halford Decamp, MD     8/23: TEVAR FROM ORIGIN OF LEFT SUBCLAVIAN ARTERY TO JUST ABOVE CELIAC ARTERY.    OR: Gtts: none; Products: none; Rhythm: SR 70s; OR Issues: none reported                Indication for surgery: expanding aortic aneurysm and chronic dissection      PMH: 86 y.o. female  has a past medical history of Atrophic kidney, CAD (coronary artery disease), Dissection of thoracic aorta, Humerus fracture (05/2021), Iron deficiency anemia, and TIA (transient ischemic attack) (07/01/2001).     Cardiologist: note established    Past Medical History:   Diagnosis Date    Atrophic kidney     Right    CAD (coronary artery disease)     Dissection of thoracic aorta     Type A dissection    Humerus fracture 05/2021    L Arm in sling s/p fall per dtr    Iron deficiency anemia     TIA (transient ischemic attack) 07/01/2001        Key Events Summary:   08/06/21: to the OR for TEVAR, uncomplicated postop course.  Spinal drain in place.  Neuro intact.   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    HTN  Restart bisoprolol       PULM:     Current oxygen requirements: RA   Chest X-ray: 8/24: Vascular congestion and edema has essentially resolved    GI:   Nutrition: ADAT   Stress ulcer prophylaxis: Pepcid   Last BM Date:  (preop)   Bowel Regimen routine; will add lactulose       RENAL:   Chronic  Kidney disease, stage IIIB   Atrophic right kidney   Creatinine 1.2; baseline: 1.0  Foley: removed and voiding     ID:   Afebrile, WBC: 14.28 (13.41)   Sent UA today, negative   Lines: PIV  Midline placed today     HEME:   Chronic anemia superimposed on acute blood loss anemia   HCT: 21.7, 23.8 on repeat   Tachycardia, weak, low Bps  Transfuse 1 unit PRBC   SQH: started today   DVT prophylaxis: SCDs    ENDO/RHEUM:   Hypothyroid- continue levothyroxine   SSI   HgbA1c: 5.0        Physical Exam:       VITAL SIGNS PHYSICAL EXAM    Vitals:    08/09/21 1311   BP: 159/71   Pulse: 90   Resp: 20   Temp: 97.8 F (36.6 C)   SpO2:      Temp (24hrs), Avg:98.2 F (36.8 C), Min:97.5 F (36.4 C), Max:99 F (37.2 C)  Intake/Output Summary (Last 24 hours) at 08/09/2021 1344  Last data filed at 08/09/2021 1145  Gross per 24 hour   Intake 1116.67 ml   Output 900 ml   Net 216.67 ml           Blood pressure goals: sys < 160   Current rhythm: SR in the 90s        Physical Exam  Neurological: Alert and oriented to person, believes we are in [place];    mood and affect normal  Cardiovascular: regular rate and rhythm, normal S1, S2, no murmurs, rubs or gallops  Lungs: clear to auscultation bilaterally, without wheezing, rhonchi, or rales  Abdomen: soft, non-tender, non-distended  normoactive bowel sounds  Extremities: no edema  Pulse: equal pulses, 4/4 symmetric  Wound: C/D/I, Left groin has a hematoma, towards hip, remains stable, right groin is soft  Right upper arm medial is swollen, hematoma with line drawn, has not expanded, site is TTP, soft.        Objective:    Recent Labs     08/09/21  0617 08/09/21  0326 08/08/21  1349   WBC 14.28* 13.04* 13.41*   Hgb 7.2* 6.3* 7.0*   Hematocrit 23.8* 21.7* 23.0*   Platelets 141* 149 115*       No results for input(s): TROPI, ISTATTROPONI, CK, CKMB, BNP, DDIMER in the last 72 hours. Recent Labs     08/09/21  0326 08/08/21  1349 08/07/21  0217   Sodium 134* 136 137   Potassium 4.0 3.9  4.1   CO2 25 26 24    BUN 18.0 18.0 19.0   Creatinine 1.3* 1.2* 1.1*   Glucose 127* 142* 117*   Magnesium 2.2 1.9 2.1      Estimated Creatinine Clearance: 27.8 mL/min (A) (based on SCr of 1.3 mg/dL (H)).   Recent Labs     08/07/21  0725 08/06/21  1758   PT INR 1.1 1.2*             Scheduled Meds:    allopurinol, 300 mg, Oral, Daily  aspirin EC, 81 mg, Oral, Daily  atorvastatin, 40 mg, Oral, QHS  bisoprolol, 5 mg, Oral, Daily  digoxin, 0.125 mg, Oral, Daily  famotidine, 20 mg, Oral, Daily  ferrous sulfate, 324 mg, Oral, QAM W/BREAKFAST  furosemide, 40 mg, Oral, Daily  heparin (porcine), 5,000 Units, Subcutaneous, Q8H SCH  latanoprost, 1 drop, Both Eyes, Daily  levothyroxine, 25 mcg, Oral, Daily at 0600  mupirocin, , Nasal, Q12H SCH  polyethylene glycol, 17 g, Oral, Daily  senna-docusate, 1 tablet, Oral, QHS      Continuous Infusions:         Subjective:   Patient is more alert today with receiving blood, she is more awake. Denies SOB. Is aware of where she is, and surgery      Clementeen Graham, FNP  Cardiovascular Surgery

## 2021-08-09 NOTE — Plan of Care (Signed)
Problem: Compromised Tissue integrity  Goal: Damaged tissue is healing and protected  Outcome: Progressing  Goal: Nutritional status is improving  Outcome: Progressing     Problem: Moderate/High Fall Risk Score >5  Goal: Patient will remain free of falls  Outcome: Progressing     Problem: Safety  Goal: Patient will be free from injury during hospitalization  Outcome: Progressing  Goal: Patient will be free from infection during hospitalization  Outcome: Progressing    Problem: Side Effects from Pain Analgesia  Goal: Patient will experience minimal side effects of analgesic therapy  Outcome: Progressing     Problem: Discharge Barriers  Goal: Patient will be discharged home or other facility with appropriate resources  Outcome: Progressing     Problem: Psychosocial and Spiritual Needs  Goal: Demonstrates ability to cope with hospitalization/illness  Outcome: Progressing     Problem: Post-op Phase - Cardiac Surgery  Goal: Effective breathing pattern is maintained  Outcome: Progressing  Goal: Patient will remain free from post-op complications  Outcome: Progressing  Goal: Mobility/activity is maintained at optimal level  Outcome: Progressing     Problem: Peripheral Neurovascular Impairment  Goal: Extremity color, movement, sensation are maintained or improved  Outcome: Progressing

## 2021-08-09 NOTE — Plan of Care (Signed)
Shift Note:      Orientation: AOx to self and person. Disoriented to time. No distress noted.  Rhythm on tele: SR on the monitor.Pulses palpable.  Oxygen: Patient on RA.  Pain:Complaining of back pain. PRN given as ordered.  Incisions/Wounds:     B/L groin.  Lines/Drips: PIV patent.  GI/GU: Abdomen soft, non-tender and non-distended.Active bowel sounds.  Fall Score: Call bell within reach.    Critical Labs/Imaging/Procedures:     Comments:    - see doc flow for complete assessment and vitals.   - Fall safety precautions reinforced, fall mat in place  - Call light and pts belongings within pts reach  - Will continue to monitor pt with hourly rounding      Significant Shift Events and Questions for Attending:    Plan:   - Vitals Q4H     Problem: Compromised Tissue integrity  Goal: Damaged tissue is healing and protected  Outcome: Progressing  Flowsheets (Taken 08/09/2021 0359)  Damaged tissue is healing and protected:   Monitor/assess Braden scale every shift   Avoid shearing injuries   Keep intact skin clean and dry   Relieve pressure to bony prominences for patients at moderate and high risk   Reposition patient every 2 hours and as needed unless able to reposition self   Provide wound care per wound care algorithm  Goal: Nutritional status is improving  Outcome: Progressing  Flowsheets (Taken 08/09/2021 0359)  Nutritional status is improving:   Assist patient with eating   Allow adequate time for meals   Encourage patient to take dietary supplement(s) as ordered     Problem: Moderate/High Fall Risk Score >5  Goal: Patient will remain free of falls  Outcome: Progressing  Flowsheets (Taken 08/08/2021 2000)  Moderate Risk (6-13):   MOD-Consider activation of bed alarm if appropriate   MOD-Apply bed exit alarm if patient is confused   MOD-Floor mat at bedside (where available) if appropriate   MOD-Consider a move closer to Nurses Station   MOD-Re-orient confused patients   MOD-Utilize diversion activities     Problem:  Safety  Goal: Patient will be free from injury during hospitalization  Outcome: Progressing  Flowsheets (Taken 08/09/2021 0359)  Patient will be free from injury during hospitalization:   Assess patient's risk for falls and implement fall prevention plan of care per policy   Provide and maintain safe environment   Use appropriate transfer methods   Hourly rounding   Ensure appropriate safety devices are available at the bedside  Goal: Patient will be free from infection during hospitalization  Outcome: Progressing  Flowsheets (Taken 08/09/2021 0359)  Free from Infection during hospitalization:   Assess and monitor for signs and symptoms of infection   Monitor lab/diagnostic results   Encourage patient and family to use good hand hygiene technique     Problem: Pain  Goal: Pain at adequate level as identified by patient  Outcome: Progressing  Flowsheets (Taken 08/09/2021 0359)  Pain at adequate level as identified by patient:   Identify patient comfort function goal   Offer non-pharmacological pain management interventions   Evaluate if patient comfort function goal is met     Problem: Side Effects from Pain Analgesia  Goal: Patient will experience minimal side effects of analgesic therapy  Outcome: Progressing     Problem: Discharge Barriers  Goal: Patient will be discharged home or other facility with appropriate resources  Outcome: Progressing  Flowsheets (Taken 08/09/2021 0359)  Discharge to home or other facility with  appropriate resources:   Provide appropriate patient education   Provide information on available health resources   Initiate discharge planning     Problem: Psychosocial and Spiritual Needs  Goal: Demonstrates ability to cope with hospitalization/illness  Outcome: Progressing  Flowsheets (Taken 08/09/2021 0359)  Demonstrates ability to cope with hospitalizations/illness:   Encourage verbalization of feelings/concerns/expectations   Provide quiet environment   Encourage patient to set small goals for  self   Encourage participation in diversional activity   Reinforce positive adaptation of new coping behaviors     Problem: Post-op Phase - Cardiac Surgery  Goal: Effective breathing pattern is maintained  Outcome: Progressing  Goal: Patient will remain free from post-op complications  Outcome: Progressing  Goal: Mobility/activity is maintained at optimal level  Outcome: Progressing

## 2021-08-09 NOTE — Progress Notes (Signed)
Patient's tentative discharge date is this weekend    Her plan to return home continues    [facility] is following    Either her son or daughter will drive her home    Grace Blight MSW  Case Manager  [facility]  [phone]  [email or web address]

## 2021-08-10 ENCOUNTER — Inpatient Hospital Stay: Payer: Medicare PPO

## 2021-08-10 LAB — CBC AND DIFFERENTIAL
Absolute NRBC: 0.02 10*3/uL — ABNORMAL HIGH (ref 0.00–0.00)
Basophils Absolute Automated: 0.02 10*3/uL (ref 0.00–0.08)
Basophils Automated: 0.2 %
Eosinophils Absolute Automated: 0.05 10*3/uL (ref 0.00–0.44)
Eosinophils Automated: 0.4 %
Hematocrit: 24.2 % — ABNORMAL LOW (ref 34.7–43.7)
Hgb: 7.5 g/dL — ABNORMAL LOW (ref 11.4–14.8)
Immature Granulocytes Absolute: 0.1 10*3/uL — ABNORMAL HIGH (ref 0.00–0.07)
Immature Granulocytes: 0.8 %
Lymphocytes Absolute Automated: 1.74 10*3/uL (ref 0.42–3.22)
Lymphocytes Automated: 13.4 %
MCH: 21.9 pg — ABNORMAL LOW (ref 25.1–33.5)
MCHC: 31 g/dL — ABNORMAL LOW (ref 31.5–35.8)
MCV: 70.6 fL — ABNORMAL LOW (ref 78.0–96.0)
Monocytes Absolute Automated: 0.99 10*3/uL — ABNORMAL HIGH (ref 0.21–0.85)
Monocytes: 7.7 %
Neutrophils Absolute: 10.04 10*3/uL — ABNORMAL HIGH (ref 1.10–6.33)
Neutrophils: 77.5 %
Nucleated RBC: 0.2 /100 WBC — ABNORMAL HIGH (ref 0.0–0.0)
Platelets: 100 10*3/uL — ABNORMAL LOW (ref 142–346)
RBC: 3.43 10*6/uL — ABNORMAL LOW (ref 3.90–5.10)
RDW: 22 % — ABNORMAL HIGH (ref 11–15)
WBC: 12.94 10*3/uL — ABNORMAL HIGH (ref 3.10–9.50)

## 2021-08-10 LAB — MAGNESIUM: Magnesium: 2 mg/dL (ref 1.6–2.6)

## 2021-08-10 LAB — CELL MORPHOLOGY
Cell Morphology: ABNORMAL — AB
Platelet Estimate: DECREASED — AB

## 2021-08-10 LAB — BASIC METABOLIC PANEL
Anion Gap: 7 (ref 5.0–15.0)
BUN: 24 mg/dL — ABNORMAL HIGH (ref 7.0–19.0)
CO2: 26 mEq/L (ref 22–29)
Calcium: 8.4 mg/dL (ref 7.9–10.2)
Chloride: 100 mEq/L (ref 100–111)
Creatinine: 1.3 mg/dL — ABNORMAL HIGH (ref 0.6–1.0)
Glucose: 93 mg/dL (ref 70–100)
Potassium: 3.8 mEq/L (ref 3.5–5.1)
Sodium: 133 mEq/L — ABNORMAL LOW (ref 136–145)

## 2021-08-10 LAB — GFR: EGFR: 39

## 2021-08-10 MED ORDER — ACETAMINOPHEN 325 MG PO TABS
650.0000 mg | ORAL_TABLET | ORAL | Status: DC | PRN
Start: 2021-08-10 — End: 2021-12-17

## 2021-08-10 MED ORDER — ASPIRIN EC 81 MG PO TBEC
81.0000 mg | DELAYED_RELEASE_TABLET | Freq: Every day | ORAL | Status: AC
Start: 2021-08-11 — End: ?

## 2021-08-10 MED ORDER — SENNOSIDES-DOCUSATE SODIUM 8.6-50 MG PO TABS
1.0000 | ORAL_TABLET | Freq: Every evening | ORAL | Status: AC | PRN
Start: 2021-08-10 — End: ?

## 2021-08-10 MED ORDER — POLYETHYLENE GLYCOL 3350 17 G PO PACK
17.0000 g | PACK | Freq: Every day | ORAL | Status: AC | PRN
Start: 2021-08-10 — End: ?

## 2021-08-10 NOTE — Plan of Care (Signed)
Shift Note:      Orientation: AOx to self and person.L upper arm fx.  Rhythm on tele: SR on the monitor. Pulses palpable.  Oxygen: Patient on RA.  Ambulation: Ambulate with walker, x 1 assist.  Pain:Denies pain.  Incisions/Wounds:     B/l groin, R upper arm swelling.  Lines/Drips: PIV patent  GI/GU: Abdomen, soft, non-tender.Active bowel sounds.  Fall Score: Call bell within reach.    Critical Labs/Imaging/Procedures:     Comments:    - see doc flow for complete assessment and vitals.   - Fall safety precautions reinforced, fall mat in place  - Call light and pts belongings within pts reach  - Will continue to monitor pt with hourly rounding      Significant Shift Events and Questions for Attending:    Plan:   - Vitals Q4H     Problem: Compromised Tissue integrity  Goal: Damaged tissue is healing and protected  Outcome: Progressing  Flowsheets (Taken 08/09/2021 0359)  Damaged tissue is healing and protected:   Monitor/assess Braden scale every shift   Avoid shearing injuries   Keep intact skin clean and dry   Relieve pressure to bony prominences for patients at moderate and high risk   Reposition patient every 2 hours and as needed unless able to reposition self   Provide wound care per wound care algorithm  Goal: Nutritional status is improving  Outcome: Progressing  Flowsheets (Taken 08/09/2021 0359)  Nutritional status is improving:   Assist patient with eating   Allow adequate time for meals   Encourage patient to take dietary supplement(s) as ordered     Problem: Moderate/High Fall Risk Score >5  Goal: Patient will remain free of falls  Outcome: Progressing  Flowsheets (Taken 08/08/2021 2000)  Moderate Risk (6-13):   MOD-Consider activation of bed alarm if appropriate   MOD-Apply bed exit alarm if patient is confused   MOD-Floor mat at bedside (where available) if appropriate   MOD-Consider a move closer to Nurses Station   MOD-Re-orient confused patients   MOD-Utilize diversion activities     Problem:  Safety  Goal: Patient will be free from injury during hospitalization  Outcome: Progressing  Flowsheets (Taken 08/09/2021 0359)  Patient will be free from injury during hospitalization:   Assess patient's risk for falls and implement fall prevention plan of care per policy   Provide and maintain safe environment   Use appropriate transfer methods   Hourly rounding   Ensure appropriate safety devices are available at the bedside  Goal: Patient will be free from infection during hospitalization  Outcome: Progressing     Problem: Pain  Goal: Pain at adequate level as identified by patient  Outcome: Progressing     Problem: Side Effects from Pain Analgesia  Goal: Patient will experience minimal side effects of analgesic therapy  Outcome: Progressing     Problem: Discharge Barriers  Goal: Patient will be discharged home or other facility with appropriate resources  Outcome: Progressing  Flowsheets (Taken 08/09/2021 0359)  Discharge to home or other facility with appropriate resources:   Provide appropriate patient education   Provide information on available health resources   Initiate discharge planning     Problem: Psychosocial and Spiritual Needs  Goal: Demonstrates ability to cope with hospitalization/illness  Outcome: Progressing

## 2021-08-10 NOTE — Plan of Care (Signed)
Problem: Compromised Tissue integrity  Goal: Damaged tissue is healing and protected  Outcome: Progressing  Goal: Nutritional status is improving  Outcome: Progressing     Problem: Moderate/High Fall Risk Score >5  Goal: Patient will remain free of falls  Outcome: Progressing     Problem: Safety  Goal: Patient will be free from injury during hospitalization  Outcome: Progressing  Goal: Patient will be free from infection during hospitalization  Outcome: Progressing    Problem: Pain  Goal: Pain at adequate level as identified by patient  Outcome: Progressing     Problem: Side Effects from Pain Analgesia  Goal: Patient will experience minimal side effects of analgesic therapy  Outcome: Progressing     Problem: Discharge Barriers  Goal: Patient will be discharged home or other facility with appropriate resources  Outcome: Progressing     Problem: Post-op Phase - Cardiac Surgery  Goal: Effective breathing pattern is maintained  Outcome: Progressing  Goal: Patient will remain free from post-op complications  Outcome: Progressing  Goal: Mobility/activity is maintained at optimal level  Outcome: Progressing     Problem: Peripheral Neurovascular Impairment  Goal: Extremity color, movement, sensation are maintained or improved  Outcome: Progressing

## 2021-08-10 NOTE — Progress Notes (Signed)
Discharge Planning---Case Management     08/10/21 1338   Discharge Disposition   Patient preference/choice provided? Yes   Physical Discharge Disposition Home, Home Health   Mode of Transportation Car   Patient/Family/POA notified of transfer plan Yes   Patient agreeable to discharge plan/expected d/c date? Yes   Family/POA agreeable to discharge plan/expected d/c date? Yes   Bedside nurse notified of transport plan? Yes   CM Interventions   Follow up appointment scheduled? No   Reason no follow up scheduled? Other (comment);Family to schedule  (Office Closed)   Notified MD? No   Referral made for home health RN visit? Yes   Multidisciplinary rounds/family meeting before d/c? Yes   Medicare Checklist   Is this a Medicare patient? Yes   Patient received 1st IMM Letter? Yes   If LOS 3 days or greater, did patient received 2nd IMM Letter? Yes       Malena Catholic, MSW, LMHP-S  Social Worker Case Management   [facility]- Case Management  [email or web address]  [phone]

## 2021-08-10 NOTE — OT Progress Note (Signed)
[facility]   Occupational Therapy Treatment     Patient: Jessica [name]    MRN#: [id number]   [facility]  Bed: FI251/FI251-01      Post Acute Care Therapy Recommendations:   Discharge Recommendations: Acute Rehab     Milestones to be reached to achieve recommendation: none  Anticipate achievement in NA sessions    DME Recommended for Discharge: Other (Comment) (TBD @ rehab)    If Acute Rehab  recommended discharge disposition is not available, patient will need 24/7 supervision, mod/max A for ADLs, min A assist for functional balance/mobility tasks, assist for IADLs, RW, shower chair, BSC equipment, and HHOT.     Therapy discharge recommendations may change with patient status.  Please refer to most recent note for up-to-date recommendations.    Assessment:   Significant Findings: None    Upon OT arrival patient sitting up in bedside chair agreeable to OT treatment. Patient is making good progress towards OT goals. Patient is currently NWBing on LUE and wearing sling secondary to previous L humeral fracture. Patient requires supervision-max A for ADLs. Patient requires mod/max A for UB/LB dressing and bathing techniques. Patient is able to complete STS with min A. Patient completes functional balance/mobility tasks ambulating short household distances with SPC and min A from therapist. Patient with a few LOB requiring min A. Therapist provided education on safety and patient receptive. Patient will continue to benefit from skilled acute OT services to increase safety/independence with ADLs & functional balance/mobility tasks.     Treatment Activities: ADL retraining, functional balance/mobility training, therapeutic exercise, therapeutic activity     Educated the patient to role of occupational therapy, plan of care, goals of therapy and HEP, safety with mobility and ADLs, energy conservation techniques, pursed lip breathing, discharge instructions, home safety.    Plan:    OT  Frequency Recommended: 3-4x/wk     Continue plan of care.       Precautions and Contraindications:   Falls   LUE NWB and don sling- L humeral fracture from June 2022    Updated Medical Status/Imaging/Labs:  Reviewed     Subjective: "I'll be ok at home I will have someone with me all of the time."    Patient's medical condition is appropriate for Occupational Therapy intervention at this time.  Patient is agreeable to participation in the therapy session. Nursing clears patient for therapy.    Pain:   Scale: no c/o of pain  Location:   Intervention:     Objective:   Patient is seated in a bedside chair with telemetry, peripheral IV, and LUE sling in place.  Pt wore mask during therapy session:Yes    Cognition  WFL    Functional Mobility  Rolling:  NT  Supine to Sit: NT  Sit to Stand: min A   Transfers: min A with SPC    PMP Activity: Step 6 - Walks in Room;Step 7 - Walks out of Room    Balance  Static Sitting: good  Dynamic Sitting: good-  Static Standing: fair  Dynamic Standing: fair    Self Care and Home Management  Eating: set up/supervision  Grooming: set up/supervision seated  Bathing: educated on ECT & pacing  UE Dressing: max A  LE Dressing: max A  Toileting: max A    Therapeutic Exercises  With activity     Participation: good  Endurance: fair    Patient left with call bell within reach, all needs met, SCDs  not in use as found, fall mat placed, bed alarm NA, chair alarm ON and all questions answered. RN notified of session outcome and patient response.     Goals:  Time For Goal Achievement: 5 visits  ADL Goals  Patient will groom self: Stand by Assist, 5 visits  Patient will dress upper body: Stand by Assist, 5 visits  Mobility and Transfer Goals  Pt will perform functional transfers: Stand by Assist, 5 visits        Executive Fucntion Goals  Pt will follow energy conservation techniques: with stand by assist, 5 visits                  PPE worn during session: procedural mask and gloves    Tech present:  No  PPE worn by tech: N/A    Kurtis Bushman, OTR/L   Pager [phone]    Time of Treatment  OT Received On: 08/10/21  Start Time: 1120  Stop Time: 1145  Time Calculation (min): 25 min    Treatment # 1 of 5 visits

## 2021-08-10 NOTE — Progress Notes (Signed)
Reviewed the following with patient and family: cardiac diet; follow-up appointments with CV surgery office, cardiologist, and PCP; medication administration guide (new medications + side effects, when next dose due); incision care, including signs/symptoms of infection; daily vital signs log (BP, weight, temp, and HR), including parameters for when to call TAVR clinic; incentive spirometry use; activity/ambulation and activity restrictions. Also reviewed with pt when to seek further treatment vs. Calling 9-1-1 (such as with signs/symptoms of a stroke).     Iv removed, tele discontinued.     Patient discharged via wheelchair with belongings intact.

## 2021-08-10 NOTE — Discharge Summary (Signed)
DISCHARGE NOTE    Date Time: 08/10/21 6:42 PM  Patient Name: [facility]  Attending Physician: No att. providers found    Date of Admission:   08/06/2021    Date of Discharge:   08/10/2021    Reason for Admission:   TIA (transient ischemic attack) [G45.9]  AAA (abdominal aortic aneurysm) without rupture [I71.4]  Aneurysm of descending thoracic aorta [I71.2]    Discharge Dx:   Descending Aortic Aneurysm   Chronic Dissection of Descending Aorta  CAD  Dementia  Hx of Repair of Type A Aortic Dissection '21  HTN  Chronic anemia    Procedures performed:     08/06/2021  Halford Decamp, MD  Procedure(s):  TEVAR  PR PLMT PROX XTN PROSTH EVASC RPR DTA 1ST Ottis Stain [09811] (THORACIC AORTIC STENT GRAFT (TAA))    Hospital Course:   The patient tolerated the procedure well and was transferred to the CVICU on no drips. The patient was noted to be neurologically intact post op and had his spinal drain removed on post op day one. She was in a sinus rhythm. The patient was transferred to the cardiac step down unit on post opertive day one.     While on the stepdown unit, the patient remained hemodynamically stable. She was restarted on her home Bisoprolol for blood pressure control. She developed a left antecubital hematoma at the site of a venous IV. Ultrasound showed no evidence of DVT and the swelling was improved by the time of discharge. Function and sensation remained intact. The patient did have a bowel movement prior to discharge. The patient was instructed to use stool softeners for prevention of constipation. She was tolerating her diet well. She was neurologically intact with mild confusion. She does have a history of mild dementia. Following the [name] stay, hemoglobin and hematocrit remained stable. Please see below for laboratory values the day of discharge. SQ Heparin was utilized for DVT prophylaxis along with sequential compression devices.     The patient's pain was adequately managed and was tolerable prior to discharge.  Prior to discharge, the patient was ambulating without difficulty. There were no new neurological complications noted during this hospital course. Physical and Occupational therapy were consulted and provided rehabilitative services. Discharge recommendations were to acute rehab but the patient and family wished to go home. The patient was felt to be medically stable and was agreeable for discharge on postoperative day four to home.     There were no complaints of dizziness, shortness of breath, orthopnea, palpitations, signs or symptoms of bleeding, fever or chills prior to discharge. The family was updated throughout the hospitalization and understood the discharge plan. All questions were answered and the patient verbalized understanding of the discharge plan.    The above is a brief synopsis of this hospital course. Please see individual progress notes for further details.     Discharge Medications:     Discharge Medication List as of 08/10/2021  1:29 PM        START taking these medications    Details   acetaminophen (TYLENOL) 325 MG tablet Take 2 tablets (650 mg total) by mouth every 4 (four) hours as needed for Pain, Starting Sat 08/10/2021, No Print      polyethylene glycol (MIRALAX) 17 g packet Take 17 g by mouth daily as needed (constipation), Starting Sat 08/10/2021, No Print      senna-docusate (PERICOLACE) 8.6-50 MG per tablet Take 1 tablet by mouth nightly as needed for Constipation, Starting Sat 08/10/2021,  No Print           CONTINUE these medications which have CHANGED    Details   aspirin EC 81 MG EC tablet Take 1 tablet (81 mg total) by mouth daily, Starting Sun 08/11/2021, No Print           CONTINUE these medications which have NOT CHANGED    Details   allopurinol (ZYLOPRIM) 300 MG tablet Take 300 mg by mouth daily, Historical Med      ascorbic acid (VITAMIN C) 100 MG tablet Take 100 mg by mouth daily, Historical Med      atorvastatin (LIPITOR) 40 MG tablet Take 40 mg by mouth daily, Historical Med       bisoprolol (ZEBETA) 5 MG tablet Take 2.5 mg by mouth daily, Historical Med      cyproheptadine (PERIACTIN) 4 MG tablet Take 4 mg by mouth 3 (three) times daily as needed, Historical Med      digoxin (LANOXIN) 0.25 MG tablet Take 125 mcg by mouth, Historical Med      famotidine (PEPCID) 40 MG tablet Take 40 mg by mouth daily, Historical Med      ferrous sulfate 325 (65 FE) MG tablet Take 325 mg by mouth every morning with breakfast, Historical Med      furosemide (LASIX) 40 MG tablet Take 40 mg by mouth daily, Historical Med      Latanoprost 0.005 % Emulsion Apply to eye, Historical Med      levothyroxine (SYNTHROID) 25 MCG tablet Take 25 mcg by mouth, Historical Med      vitamin D (CHOLECALCIFEROL) 25 MCG (1000 UT) tablet Take 1,000 Units by mouth daily, Historical Med      albuterol sulfate HFA (PROVENTIL) 108 (90 Base) MCG/ACT inhaler Inhale 2 puffs into the lungs every 4 (four) hours as needed for Wheezing, Historical Med           STOP taking these medications       mupirocin (BACTROBAN) 2 % ointment        potassium chloride (KLOR-CON) 10 MEQ tablet        acetaminophen-codeine (TYLENOL #2) 300-15 MG per tablet        albuterol (PROVENTIL) 2 MG tablet                Discharge Labs:     Recent Labs   Lab 08/10/21  0531   WBC 12.94*   Hgb 7.5*   Hematocrit 24.2*   Platelets 100*       Recent Labs   Lab 08/10/21  0531   Sodium 133*   Potassium 3.8   Chloride 100   CO2 26   BUN 24.0*   Creatinine 1.3*   EGFR 39.0   Glucose 93   Calcium 8.4       Recent Labs   Lab 08/07/21  0217   Bilirubin, Total 0.8   Bilirubin Direct 0.4   Protein, Total 7.1   Albumin 2.2*   ALT 8   AST (SGOT) 17         Discharge Instructions:   See AVS and patient discharge instructions for details  Follow up with Cardiac Surgery, Cardiology, and her PCP as scheduled/instructed.              Multidisciplinary Visit: Provider(s) to Follow Up With       Kandis Mannan, MD   Specialty: Internal Medicine   Relationship: PCP -  General    [facility]  [address]  [address]  [place] [address]   Phone: [phone]       Next Steps: Call    Instructions: Call PCP to make an appointment to be seen 4 weeks after discharge from hospital.    [facility]    [address]  [place] [address]   Phone: [phone]       Next Steps: Follow up    Instructions: Attend cardiac rehab once approved by your cardiologist. Please ask you cardiologist if you would like to attend a location closer to home.    Halford Decamp, MD   Specialty: Thoracic and Cardiac Surgery   Relationship: Consulting Physician    [facility]  [address]  [place] [address]   Phone: [phone]       Next Steps: Call    Instructions: Call Dr. Coral Else office on [facility] 8/29 to make an appointment to be seen this week. The office has been notified of your discharge from the hospital.    Cardiologist        Next Steps: Follow up    Instructions: Call cardiologist to make an appointment to be seen 2 weeks after discharge from hospital. If you do not have a cardiologist, please ask your PCP for recommendations    HOME HEALTH VISITS    to be determined on MON 08/12/21       Next Steps: Call in 2 day(s)    Instructions: You will be contacted by Home Health agency within 48 hours after Depart from [facility] agency to be arranged and confirmed on MON 08/12/21 to be determined as per Insurance and home zip code Discharge to address              Signed by: Avel Peace, PA-C.  Cardiovascular Surgery.

## 2021-08-10 NOTE — Progress Notes (Addendum)
HOME HEALTH LIAISON:  D/C PLANNING    Spoke with patient daughter to discuss / offer Home Health visits - they are OK with this plan and confirmed the Demographic info from Face Sheet     Home Health info has been added to the Discharge AVS - ready to print     Fara Chute, Weekend Only [facility]    Home Address: [address]  [place] [address]     Patient Contact:   [phone]    [phone] (home)       [phone] (mobile)      Emergency contact:     Name Home Phone Work Phone Mobile Phone Relationship [facility]   Stoudt,JESSICA     [phone] Daughter     RADHA, COGGINS     [phone]        Fara Chute, Weekend Only [facility]

## 2021-08-10 NOTE — PT Progress Note (Signed)
[facility]   Physical Therapy Treatment  Patient:  Tashauna Caisse MRN#:  [id number]  [facility]  Bed: FI251/FI251-01     POST ACUTE CARE THERAPY RECOMMENDATIONS:      Discharge Recommendations: Acute Rehab     Patient is very motivated with physical therapy and will tolerate 3 hours of therapy per day, 5 days a week. Recommend discharge to acute rehab in order to maximize independence prior to returning home.    Milestones to be reached to achieve recommendation: none      DME Recommendations: TBD at rehab    If the above recommended discharge disposition is not available, patient will need Minimal assistance for functional mobility, Bedside commode, Shower chair, Wheelchair, and Medical transport into home, and HHPT.     Therapy discharge recommendations may change with patient status. Please refer to most recent note for up-to-date recommendations.     ASSESSMENT:     Significant Findings: none    Patient seated in a bedside chair upon arrival, amenable to physical therapy treatment session. Patient performed most functional mobility with Min assist, ambulating 13ft x2 in hallway with SPC. LUE sling in place. Patient presented with lateral deviation and mild scissoring with ambulation. Patient will continue to benefit from inpatient PT services to maximize functional mobility and independence.    Treatment Activities: gait training, therapeutic exercise    Educated the patient to role of physical therapy, plan of care, goals of therapy and safety with mobility and ADLs, HEP, weight bearing precautions.     PLAN:   Treatment/Interventions: Exercise, Stair training, Gait training, Neuromuscular re-education, LE strengthening/ROM, Functional transfer training, Endurance training, Cognitive reorientation, Patient/family training, Equipment eval/education, Bed mobility    PT Frequency: 4-5x/wk    Continue plan of care.       Precautions and Contraindications:   Falls  Weight Bearing:  Left UE Non weight bearing     SUBJECTIVE: "I'll just call out for someone to help me at home"   Patient's medical condition is appropriate for Physical Therapy intervention at this time.  Patient is agreeable to participation in the therapy session. Nursing clears patient for therapy.    Pain Assessment:  No report of pain     OBJECTIVE:   Observation of Patient:   Patient with telemetry, LUE sling in place.  Patient wore mask during therapy session:Yes  PPE worn during session: procedural mask, goggles, and gloves    Functional Mobility:  PMP - Progressive Mobility Protocol   PMP Activity: Step 7 - Walks out of Room  Distance Walked (ft) (Step 6,7): 75 Feet    Transfers:  Sit to Stand: Minimal assistance  Stand to Sit: Minimal assistance    Ambulation:  Distance: 75 Feet x2  Device: Single point cane  Assist Level: Minimal assistance  Pattern: decreased pace, decreased cadence, lateral path deviation noted, scissoring    Balance:  Static sitting: good   Dynamic sitting: good   Static standing: fair   Dynamic standing: fair     Patient Participation: good  Patient Endurance: fair    Patient left with call bell within reach, all needs met, and all questions answered    SCD's off (as found)  Floor mat in place  Chair alarm in place    Goals:  Goals  Goal Formulation: With patient  Time for Goal Acheivement: 5 visits  Goals: Select goal  Pt Will Go Supine To Sit: with supervision  Pt Will Perform Sit  to Stand: with supervision  Pt Will Ambulate: > 200 feet, with single point cane, with supervision  Pt Will Go Up / Down Stairs: 6-10 stairs, with supervision, With rail    Time of Treatment  PT Received On: 08/10/21  Start Time: 1210  Stop Time: 1240  Time Calculation (min): 30 min    Daleen Squibb, PT, DPT

## 2021-08-11 NOTE — Progress Notes (Addendum)
Home Health Referral     NEGATIVE COVID19 TEST on 07/31/2021          Referral from (Case Manager) for home health care upon discharge.    By [employer], the patient has the right to freely choose a home care provider.  Arrangements have been made with:    A company of the patients choosing. We have supplied the patient with a listing of providers in your area who asked to be included and participate in Medicare.  [facility], a home care agency that provides both adult home care services which is a wholly owned and operated by [employer] and participates in [employer]  The preferred provider of your insurance company. Choosing a home care provider other than your insurance company's preferred provider may affect your insurance coverage.    The Home Health Care Referral Form acknowledging the voluntary selection of the home care company has been completed, signed, and is on file.      Home Health Discharge Information     Your doctor has ordered SN / PT / OT in-home service(s) for you while you recuperate at home, to assist you in the transition from hospital to home.    The agency that you or your representative chose to provide the service:    HOME HEALTH AGENCY TO BE DETERMINED FOR Jessica Clock, MD     The above services were set up by:      [facility] team [address], Weekend Only [facility] [phone] for Jessica [name] [name] [name] to [facility] [phone]           HOME HEALTH REFERRAL    PATIENT DEMOGRAPHICS:     NEGATIVE COVID19 TEST on 07/31/2021    Name: Jessica [name] [name] [name]    Discharge Address: [address]  [place] [address]    Home Address: [address]  [place] [address]      Patient Contact:   [phone]     [phone] (home)       [phone] (mobile)      Emergency contact:      Name Home Phone Work Phone Mobile Phone Relationship Jessica [name] [name] [name]   Jessica [name],Jessica [name] [name]     [phone] Daughter     Jessica [name], Jessica [name] [name] [name]     [phone]        Primary Telephone Number:   [phone]    Secondary Telephone Number:  [phone] (mobile)     Emergency Contact and Number:     Extended Emergency Contact Information  Primary Emergency Contact: Jessica [name],Jessica [name] [name]  Mobile Phone: [phone]  Relation: Daughter  Secondary Emergency Contact: Jessica [name],Jessica [name]  Mobile Phone: [phone]  Relation: None    Ordering Physician:  Halford Decamp MD     Following Physician:  Halford Decamp MD    PCP: Kandis Mannan, MD, [phone]    Language/Communication Barrier:  NO    Primary Diagnosis and Reason for Services:    AAA (abdominal aortic aneurysm) without rupture [I71.4]    Home nursing required for skilled assessment including cardiopulmonary assessment and dietary education for disease management, and medication instruction. Home PT/OT required for gait and balance training, strengthening, mobility, fall prevention, and ADL training.     Diagnoses:       AAA (abdominal aortic aneurysm) without rupture [I71.4] 08/06/2021   Aneurysm of descending thoracic aorta [I71.2] 08/06/2021     Recent Surgery, Date of Surgery, and Provider Performing:   Procedure(s):  TEVAR  08/06/2021  Surgeon(s):  Halford Decamp, MD     [employer] (Labs, Wounds, Infusions, etc.):    Additional Comments:  START OF CARE CONFIRMED, NO SCHEDULER CALL REQUIRED - ICT STANDING ORDERS - 48 HOUR CARDIAC SURGERY PATIENT    COVID-19 Screening:    NEGATIVE COVID19 TEST on 07/31/2021    COVID19 negative    If positive or result pending, is patient agreeable to wearing PPE during visit?  N/A    Discharge Date:   08/10/2021    Referral Source (PACC/Hospital/Unit):       [facility] team - [place], [place] Only [place] [phone] for Jessica [name] [name] [name] to [facility] [phone]    Referral Date: 08/11/21      Home Health face-to-face (FTF) Encounter (Order [id number])  Consult  Date: 08/11/2021 [facility] Ordering/Authorizing: Halford Decamp, MD     Order Information    Order Date/Time Release  Date/Time Start Date/Time End Date/Time   08/11/21 05:22 PM None 08/10/21 02:40 PM 08/10/21 02:40 PM     Order Details    Frequency Duration Priority Order Class   Once 1  occurrence Routine Hospital Performed     Standing Order Information    Remaining Occurrences Interval Last Released     0/1 Once 08/11/2021              Provider Information    Ordering User Ordering Provider Authorizing Provider   Jacqueline Delapena, De Nurse, RN Halford Decamp, MD Halford Decamp, MD   Attending Provider(s) Admitting Provider PCP   Halford Decamp, MD Halford Decamp, MD Kandis Mannan, MD     Verbal Order Info    Action Created on Order Mode Entered by Responsible Provider Signed by Signed on   Ordering 08/11/21 1722 Telephone with readback Fara Chute, RN Halford Decamp, MD             Comments    HOME HEALTH VISITS:  SN / PT / OT :  ICTS STANDING ORDERS:  PRIMARY PROBLEM ICD-10 CODE FOR HOME HEALTH IS:  AAA (abdominal aortic aneurysm) without rupture [I71.4] - Home nursing required for skilled assessment including cardiopulmonary assessment and dietary education for disease management, and medication instruction. Home PT/OT required for gait and balance training, strengthening, mobility, fall prevention, and ADL training.       Diagnoses:       AAA (abdominal aortic aneurysm) without rupture [I71.4] 08/06/2021   Aneurysm of descending thoracic aorta [I71.2] 08/06/2021       Recent Surgery, Date of Surgery, and Provider Performing:   Procedure(s):   TEVAR   08/06/2021   Surgeon(s):   Halford Decamp, MD     Following Physician:  Halford Decamp MD ICTS Cardiac Surgeon will follow the patient after Discharge from the Hospital     Additional orders:     Surgeon requests [facility] to be second full day home after Jessica [name] [name] [name] from hospital     Dr. Halford Decamp MD (ICTS surgeon) [phone] will be attending until discharged from surgeon.      ICTS standing orders:     ROUTINE ORDERS FOR [place] [facility]ORACIC [facility]GERY   [place] [facility]URSING  INSTRUCTIONS FOR CARDIAC SURGERY PATIENTS     Notify the surgeon for:   Oral temperature >100.5 (F)   Heart rate <50 or over 100 at rest   Blood pressure systolic <90 or >150, diastolic <50 or >90   Orthostatic hypotension: Systolic  Decrease  by or more, Diastolic decrease by 10 mmHg, or HR elevated by 20 bpm or more   Respirations <12 or >25   Fasting capillary blood glucose <70 or >250   Weight gain over 3 lbs. in 24 hours or >5 lbs in one week   Excessive tiredness or weakness   Shortness of breath (particularly at rest)   Angina-like chest pain   Nausea or vomiting   Excessive swelling of legs   Excessive swelling of arms (if radial artery used)   Increased redness, tenderness or painful incision, especially if accompanied by drainage or fever   Blood in urine or stool   Oxygen saturation <90%   No BM for 3 days following discharge     Routine Incision Care (Sternal, leg, arm, chest tube sites):   Patients should shower daily.  Remove dressings if present and use liquid body wash (Dove sensitive skin is recommended).   Use clean separate wash cloth for each incision area.  Use a clean towel to dry.   Patients with Prevena dressings should keep tem on until postoperative day 7. Patients can shower with Prevena on but should avoid direct spraying of water on dressing. Home Health RN to remove Prevena on postoperative day 7, if still in place.   Patients with Dermabond Prineo mesh dressing should leave it in place until it falls off. Peeled up edges can be trimmed carefully with clean scissors. Patients with Prineo dressing can shower and get the dressing wet.   Do not apply lotions, creams, powder, or ointments  to incisions   Do not try and close chest tube sites with Steri -strips   If chest tube sutures are present,  RN may remove 48 hours after the start of home health care.   If sternal, chest tube sites, or graft harvest sites are dry and intact, keep open to air.  If sternal, chest tube sites, or  graft sites are draining, cover with dry gauze and tape.  Change daily, or as needed, until newly epithelialized, then leave open to air.  May instruct caregiver in wound care.   Advise patient with any leg swelling to elevate legs, ideally higher than the level of the heart, or to a comfortable level.   If incisions have increased drainage, necrotic tissue, foul odor, or new open wounds are present,     notify the cardiac surgery office immediately.   If radial Artery arm is swollen, elevate when possible. If fingers on the radial artery harvest side are cold, pale, or have poor capillary refill, notify the cardiac surgery office immediately.  If any signs of infection or moderate to severe swelling, notify the cardiac surgery office immediately.     Routine Care Orders (per visit)   VS, heart rhythm, wound inspection   Apical pulse for 1 full minute   Pulmonary: incentive spirometer 10x/hour while awake, assess breath sounds   Activities: encourage out of bed during the day, daily showers   "Loosening up" exercises - continue with activity restrictions given to patient in discharge folder or by PT/OT (move within the tube). If lifting greater than 10 lbs, ensure patient is moving within the tube as taught in the hospital.f   Diet: no added salt for the first 6 weeks, then "Heart Healthy Diet"   Medications: per Epic AVS.  Patient will have printed copy.   Bowel movements: may use Pericolace 1-2 tabs PO BID prn constipation.  If no BM by day 3, Milk of  Magnesia 2 TBSP PO daily prn constipation   Pain:  assess all systems   Pulse oximetry PRN for SOB, tachycardia, and/or to assess patient's tolerance of activity   Driving: do not drive until cleared at office visit   Reinforce "move within the tube"   Assess and reinforce fall precautions     Lab Work   As ordered at discharge   If PT/INR is ordered, obtain finger stick, may do venipuncture if necessary   An attempt should be made to complete coagulation tests early  in the day, as same day results are expected prior to close of office by 1700.  If this cannot be accomplished, please notify managing physician ASAP so that a dosing plan can be established with the patient prior to closing that day.   If RN visiting needs to contact office personnel regarding problems, and wish to add lab work to the orders please call directly for new orders (i.e. urinary symptoms: get UA/C&S; for fever/infection: get CBC with differential)     Miscellaneous    Home health RN should ensure that patient has made follow up appointments with his/her primary care doctor and cardiologist if these were not made prior to discharge.     Home Care Visits   Patients should receive 3 visits per week for 2 weeks, then 2 times  a week for 1 week,  then once a week for 1 week.       QUESTIONS: For any questions regarding the number of visits, please call the cardiac surgery NP's at [phone] M-F 9 AM-5PM.  All other hours (evenings, weekends, and holidays) please call the surgeons' office answering service at [phone]     HPI:     Date of Procedure: 08/06/2021   Procedure:   1. TEVAR [MDT Val Cap 42 x 42 x 150 mm; 42 x 42 x 100 mm;  42 x 38 x 150 mm; 38 x 38 x 100 mm endografts]   2. USG guided access and device closure of the R CFA [> 12 Fr] [29562] with an 18 Fr Manta device   3. Aortography: non-selective   4. IVUS: non-coronary   5. Rad S and I: TEVAR   6. Rad S and I: USG   7. Rad S and I: IVUS   CV Surgeon: Lorin Picket, MD   Vascular Surgeon [place] and Access]: D. Marcy Panning, MD   IR: Ruthine Dose, MD   PreopDx: Aneurysmal chronic type IIIB AD   PostopDx: Same   Description: The patient was prepped and draped in the usual sterile fashion. The L CFA was accessed with a Micropuncture needle under USG and cannulated with a 6 Fr sheath. The vessel depth was assessed with the [facility] device and then re-cannulated with an 11 Fr sheath. A Bentson wire was advanced into the ascending aorta through a  vertebral cathter and exchanged for a Lunderquist wire. The R CFA was accessed with a Micropuncture needle under USG guidance and cannulated with a 6 Fr sheath. A marker pig-tail was then advanced into the ascending aorta over a Glide Wire. The patient was heparinized and a Volcano IVUS probe was advanced from the L CFA into the arch. The wires were within the TL along their entire courses. The IVUS prove and sheath were exchanged for a MDT Val Cap 42 x 42 x 150 mm endograft, which was advanced into the mid-arch. All parallax was removed from the device and an arch aortogram was performed. The distal margin  of the L SCA was marked on the screen and the device was deployed flush with this mark. Post deployment aortography demonstrated no visible IA endoleak. We the re-positioned the pig-tail in zone V and performed a CO2 aortogram. The celiac was marked on the screen and three additional components were deployed in the following order: MDT Val Cap 42 x 42 x 100 mm; 42 x 38 x 150 mm and 38 x 38 x 100 mm. Completion CO2 visceral aortography demonstrated no visible IB with good filling of the celiac and SMA. The device was withdrawn over the wire and an 18 Fr Manta device was deployed with good hemostatic effect. CO2 left ileo-femoral arteriography demonstrated no stenosis and good run-off. The R CFA sheath was removed and post-closed with a single Proglide. There were no evident complications.              Home Health face-to-face (FTF) Encounter: Patient Communication    Not Released Not seen         Order Questions    Question Answer   Date I saw the patient face-to-face: 08/10/2021   Evidence this patient is homebound because: A.  Post operative restrictions, weight bearing status impedes mobility > 5 feet   Medical conditions that necessitate Home Health care: A.  Post operative procedure requiring follow up care & monitoring for complication   Per clinical findings, following services are medically necessary:  Skilled Nursing    PT    OT   Clinical findings that support the need for Skilled Nursing. SN will: A. Educate on post operative care, restrictions & management of complications    B. Monitor post operative site for infection and educate on care management    C. Monitor for signs and symptoms of exacerbation of disease and management    G. Educate on new diagnosis, treatment & management to prevent re-hospitalization    D. Review medication reconciliation, manage and educate on use and side effects    H. Assess cardiopulmonary status and monitor for signs &symptoms of exacerbation   Clinical findings that support the need for Physical Therapy. PT will A.  Evaluate and treat functional impairment and improve mobility   OT will provide assistance with: Home program to improve ability to perform ADLs   Other (please specify) HOME HEALTH VISITS:  SN / PT / OT :  ICTS STANDING ORDERS:  PRIMARY PROBLEM ICD-10 CODE FOR HOME HEALTH IS:  AAA (abdominal aortic aneurysm) without rupture [I71.4]                    Process Instructions    Please select Home Care Services medically necessary.     Based on the above findings, I certify that this patient is confined to the home and needs intermittent skilled nursing care, physical therapry and / or speech therapy or continues to need occupational therapy. The patient is under my care, and I have initiated the establishment of the plan of care. This patient will be followed by a physician who will periodically review the plan of care.      Collection Information            Consult Order Info    ID Description Priority Start Date Start Time   [id number] Home Health face-to-face (FTF) Encounter Routine 08/10/2021  2:40 PM   Provider Specialty Referred to   ______________________________________ _____________________________________  Verbal Order Info    Action Created on Order Mode Entered by Responsible Provider Signed by Signed on   Ordering 08/11/21 1722  Telephone with Zadie Cleverly, RN Halford Decamp, MD             Patient Information    Patient Name   Jessica [name], Jessica [name] [name] [name] Legal Sex   Female DOB   [birth year]       Reprint Order Requisition    Home Health face-to-face (FTF) Encounter (Order #[id number]) on 08/11/21       Additional Information    Associated Reports External References   Priority and Order Details [employer]

## 2021-08-12 ENCOUNTER — Telehealth: Payer: Self-pay

## 2021-08-12 ENCOUNTER — Telehealth (INDEPENDENT_AMBULATORY_CARE_PROVIDER_SITE_OTHER): Payer: Self-pay | Admitting: Surgery

## 2021-08-12 NOTE — Telephone Encounter (Signed)
Received call from Dr reporting pt presented to [facility] ED for fever - found to be anemic, Hgb 6.5 down from 7.5 on 8/27. Dr. Reporting to our office as pt was wearing her cardiac surgery bracelet.     Pt slightly altered per MD. Plans to perform CTA scan - awaiting blood work.

## 2021-08-12 NOTE — Telephone Encounter (Signed)
Called to follow up on patient, unavailable, left message , and to call the cardiac surgery office ([phone]) for any questions, concerns, or issues.

## 2021-08-13 ENCOUNTER — Telehealth: Payer: Self-pay

## 2021-08-13 NOTE — Telephone Encounter (Signed)
Thank you :)

## 2021-08-13 NOTE — Telephone Encounter (Signed)
Left message

## 2021-08-13 NOTE — Progress Notes (Signed)
First agency to accept is [facility]  [phone]. Faxed referral to agency. TC to patient and left a message with information of Home Health agency.

## 2021-08-13 NOTE — Telephone Encounter (Signed)
Called and left a voicemail requesting a called back to schedule a post operative appointment.

## 2021-08-13 NOTE — Telephone Encounter (Addendum)
Could you follow up with this patient and also obtain records from [facility] ER?   Thank you.

## 2021-08-13 NOTE — Telephone Encounter (Signed)
Thank you. I see the records in care everywhere. Looks like ER spoke to Dr. Alycia Rossetti who recommended transfusion and monitoring. Are you able to locate CT images itself?

## 2021-08-14 ENCOUNTER — Encounter: Payer: Self-pay | Admitting: Surgery

## 2021-08-14 NOTE — Progress Notes (Signed)
Informed by [facility] that patient has been admitted to [facility] on 08/12/21. Verified with surgeon's office, Cat, that they are aware.

## 2021-08-26 ENCOUNTER — Encounter (INDEPENDENT_AMBULATORY_CARE_PROVIDER_SITE_OTHER): Payer: Self-pay | Admitting: Surgery

## 2021-08-29 ENCOUNTER — Encounter (INDEPENDENT_AMBULATORY_CARE_PROVIDER_SITE_OTHER): Payer: Self-pay | Admitting: Family

## 2021-08-30 NOTE — Progress Notes (Signed)
Requested assistance of Jessica [name] , scheduler, to obtain CT images from Jessica [name]'s recent [facility] hospital admission via email for surgeon review.

## 2021-09-09 ENCOUNTER — Telehealth (INDEPENDENT_AMBULATORY_CARE_PROVIDER_SITE_OTHER): Payer: Self-pay | Admitting: Surgery

## 2021-09-09 NOTE — Telephone Encounter (Signed)
lvm to confirm appt/vr-9/26

## 2021-09-11 NOTE — Progress Notes (Signed)
Cardiac Surgery Post Operative    Patient Name: Jessica [name] Age: 86 y.o.   Today's Date: 09/12/2021  Gender: female   Date of Birth: Aug 17, 1935 Race:  Other   Surgeon: Lorin Picket MD & Dr. Marcy Panning & Dr. Arbie Cookey Surgery: TEVAR [MDT Val Cap 42 x 42 x 150 mm; 42 x 42 x 100 mm;  42 x 38 x 150 mm; 38 x 38 x 100 mm endografts]   Referring Physician(s):  Primary Care Physician: Kandis Mannan, MD   Date of Surgery: 08/06/21 Discharge Date: 08/10/21       Hospital Course:  She is a  86 y.o. female  has a past medical history of Atrophic right kidney, CAD (coronary artery disease), Dissection of thoracic aorta, Humerus fracture (05/2021), Iron deficiency anemia, and TIA (transient ischemic attack) (07/01/2001), and Type A aortic dissection.    Postoperative course significant for:  hx of dementia, waxes/wanes, need for constant re-orientation.  Elevated WBC, UA negative.  Hx of chronic anemia, transfused 1U PRBC.      Pt was discharged to acute rehab.  While at rehab, pt developed fever and was transported to [facility] ED where she was found to be anemic, Hgb 6.5 and d/t AMS CT was ordered as well.  Pt had full cardiac and neuro workup.      Amonie Wisser and her daughters present today for a post-operative visit.  Pt denies CP, abdominal pain nor back pain.  Pt presented in wheelchair and was discharged from [facility] with NC O2 . Daughters report incisions healed, pt just does not want to eat. Home PT/OT will get Speech Therapy involved to facilitate feedings/appetite.  Non-Con CT of C/A/P completed at [facility] on 08/12/21 - No images only report.      Radiology and Test Review:   CXR 08/20/21  [facility])  The heart and lungs are stable in appearance. When allowances are made for differences in technique and positioning, no significant interval change is seen in the appearance of the chest compared with the previous examination dated 08/16/2021.      CXR 08/16/21  [facility])  Minimal persistent haziness in  the left lung base silhouetting the left hemidiaphragm. No pneumothorax. Aortic stent graft is appreciated. There is cardiac prominence. Patient is status post median sternotomy. Bones and soft tissues are unremarkable.     TTE 08/14/21  [facility])  LVEF 55-70%.  Moderate AR.  Mild AV sclerosis.  Moderately elevated RVSP .  Moderate pHTN.     Carotid doppler 08/14/21  [facility])  No significant abnormalities seen    MRI brain wo contrast 08/13/21  [facility])  Acute lacunar infarct within the left cerebellar hemisphere. Old infarct within the left occipital lobe. Old hemorrhages within the left basal ganglia and bilateral occipital lobes. Microvascular changes affecting the periventricular white matter.    CT C/A/P wo contrast 08/12/21  [facility])  Postoperative changes with thoracic endovascular aneurysm repair. Native vessel diameter of 7 cm. Endoleak cannot be evaluated on a noncontrast exam. Minimal left pleural effusion and left basilar atelectasis. Cholelithiasis. No abdominal or pelvic mass. Groundglass opacity in the lung fields bilaterally raising the possibility of interstitial lung disease, interstitial edema or pulmonary vascular congestion     CT head wo contrast 08/12/21  [facility])  Infarct in the left posterior temporal lobe with lucency. This is age-indeterminate. Bilateral basal ganglia lucency suggesting lacunar infarct most likely chronic. Involutional changes with cortical atrophy, periventricular white matter  lucency and compensatory ventricular enlargement.     Review of Systems   Constitutional:  Positive for fatigue. Negative for chills, fever and unexpected weight change.   Respiratory:  Positive for shortness of breath. Negative for cough.    Cardiovascular: Negative.  Negative for chest pain and leg swelling.   Gastrointestinal:  Negative for constipation, nausea and vomiting.     No Known Allergies  Current Outpatient Medications   Medication Sig Dispense  Refill    acetaminophen (TYLENOL) 325 MG tablet Take 2 tablets (650 mg total) by mouth every 4 (four) hours as needed for Pain      albuterol sulfate HFA (PROVENTIL) 108 (90 Base) MCG/ACT inhaler Inhale 2 puffs into the lungs every 4 (four) hours as needed for Wheezing      allopurinol (ZYLOPRIM) 300 MG tablet Take 300 mg by mouth daily      ascorbic acid (VITAMIN C) 100 MG tablet Take 100 mg by mouth daily      aspirin EC 81 MG EC tablet Take 1 tablet (81 mg total) by mouth daily      atorvastatin (LIPITOR) 40 MG tablet Take 40 mg by mouth daily      bisoprolol (ZEBETA) 5 MG tablet Take 2.5 mg by mouth daily      digoxin (LANOXIN) 0.25 MG tablet Take 125 mcg by mouth      dronabinol (MARINOL) 2.5 MG capsule Take 1 capsule by mouth 2 (two) times daily      famotidine (PEPCID) 40 MG tablet Take 40 mg by mouth daily      ferrous sulfate 325 (65 FE) MG tablet Take 325 mg by mouth every morning with breakfast      furosemide (LASIX) 40 MG tablet Take 40 mg by mouth daily      Latanoprost 0.005 % Emulsion Apply to eye      levothyroxine (SYNTHROID) 25 MCG tablet Take 25 mcg by mouth      polyethylene glycol (MIRALAX) 17 g packet Take 17 g by mouth daily as needed (constipation)      senna-docusate (PERICOLACE) 8.6-50 MG per tablet Take 1 tablet by mouth nightly as needed for Constipation      vitamin D (CHOLECALCIFEROL) 25 MCG (1000 UT) tablet Take 1,000 Units by mouth daily      cyproheptadine (PERIACTIN) 4 MG tablet Take 4 mg by mouth 3 (three) times daily as needed (Patient not taking: Reported on 09/12/2021)       No current facility-administered medications for this visit.        BP 101/63 (BP Site: Left arm, Patient Position: Sitting, Cuff Size: Medium)   Pulse 76   Temp 97.9 F (36.6 C) (Temporal)   Resp 19   Ht 1.626 m (5\' 4" )   Wt (!) 44.7 kg (98 lb 9.6 oz)   SpO2 94%   BMI 16.92 kg/m     Objective:   Physical Exam  Vitals and nursing note reviewed.   Constitutional:       Appearance: She is underweight.    Cardiovascular:      Rate and Rhythm: Normal rate and regular rhythm.      Heart sounds: Normal heart sounds.   Pulmonary:      Effort: Pulmonary effort is normal.      Breath sounds: Normal breath sounds.   Abdominal:      Palpations: Abdomen is soft.      Tenderness: There is no abdominal tenderness.   Musculoskeletal:  Right lower leg: No edema.      Left lower leg: No edema.   Skin:     Comments: *Groin Incision healed   Neurological:      Mental Status: She is alert.   Psychiatric:         Behavior: Behavior is cooperative.        Labs:  Lab Results   Component Value Date    WBC 12.94 (H) 08/10/2021    HGB 7.5 (L) 08/10/2021    HCT 24.2 (L) 08/10/2021    MCV 70.6 (L) 08/10/2021    PLT 100 (L) 08/10/2021     Lab Results   Component Value Date    GLU 93 08/10/2021    BUN 24.0 (H) 08/10/2021    CREAT 1.3 (H) 08/10/2021    CA 8.4 08/10/2021    NA 133 (L) 08/10/2021    K 3.8 08/10/2021    CL 100 08/10/2021    CO2 26 08/10/2021     Lab Results   Component Value Date    ALT 8 08/07/2021    AST 17 08/07/2021    ALKPHOS 57 08/07/2021    BILITOTAL 0.8 08/07/2021         Diagnosis:   1. S/P aortic dissection repair    2. Dissection of thoracic aorta    3. Stage 3b chronic kidney disease      Assessment/Plan:   86 y/o female hemodynamically stable C/O Fatigue, weakness and No appetite S/P TEVAR.      PLAN:   Follow CTA C/A/P, timing to be determined by Dr. Alycia Rossetti.  Patient's daughter, [name], will notified of CTA timing.  Routine post operative instructions reviewed with the patient including: Increase ambulation as tolerated and directed by Home PT/OT.  Increase activities as tolerated and directed by Home PT/OT  Increase oral protein intake with OTC protein shakes/supplements 2-3 times daily- Speech Therapy may get involved to facilitate feedings/increase appetite  Discharged to medical follow up  Follow up with PCP Dr. Gaynell Face ASAP.  Follow up with Cardiology in one week.  Follow up with Pulmonary scheduled for  tomorrow.  Contact our office with any questions and/or concerns as they may arise    Landis Gandy, PA-C

## 2021-09-12 ENCOUNTER — Ambulatory Visit (INDEPENDENT_AMBULATORY_CARE_PROVIDER_SITE_OTHER): Payer: Medicare PPO | Admitting: Physician Assistant

## 2021-09-12 ENCOUNTER — Encounter (INDEPENDENT_AMBULATORY_CARE_PROVIDER_SITE_OTHER): Payer: Self-pay | Admitting: Physician Assistant

## 2021-09-12 VITALS — BP 101/63 | HR 76 | Temp 97.9°F | Resp 19 | Ht 64.0 in | Wt 98.6 lb

## 2021-09-12 DIAGNOSIS — N1832 Chronic kidney disease, stage 3b: Secondary | ICD-10-CM

## 2021-09-12 DIAGNOSIS — I7101 Dissection of thoracic aorta: Secondary | ICD-10-CM

## 2021-09-12 DIAGNOSIS — I71019 Dissection of thoracic aorta, unspecified: Secondary | ICD-10-CM

## 2021-09-12 DIAGNOSIS — Z9889 Other specified postprocedural states: Secondary | ICD-10-CM

## 2021-09-12 DIAGNOSIS — R5383 Other fatigue: Secondary | ICD-10-CM

## 2021-09-25 ENCOUNTER — Other Ambulatory Visit (INDEPENDENT_AMBULATORY_CARE_PROVIDER_SITE_OTHER): Payer: Self-pay | Admitting: Physician Assistant

## 2021-09-26 ENCOUNTER — Other Ambulatory Visit (INDEPENDENT_AMBULATORY_CARE_PROVIDER_SITE_OTHER): Payer: Self-pay | Admitting: Physician Assistant

## 2021-09-26 DIAGNOSIS — I71019 Dissection of thoracic aorta, unspecified: Secondary | ICD-10-CM

## 2021-09-26 DIAGNOSIS — Z9889 Other specified postprocedural states: Secondary | ICD-10-CM

## 2021-12-11 ENCOUNTER — Ambulatory Visit (INDEPENDENT_AMBULATORY_CARE_PROVIDER_SITE_OTHER): Payer: Medicare PPO | Admitting: Surgery

## 2021-12-13 ENCOUNTER — Inpatient Hospital Stay: Payer: Medicare PPO

## 2021-12-13 ENCOUNTER — Inpatient Hospital Stay
Admission: AD | Admit: 2021-12-13 | Discharge: 2021-12-18 | DRG: 314 | Disposition: A | Discharge status: Hospice | Payer: Medicare PPO | Source: Other Acute Inpatient Hospital | Attending: Surgery | Admitting: Surgery

## 2021-12-13 ENCOUNTER — Encounter
Admission: AD | Disposition: A | Discharge status: Hospice | Payer: Self-pay | Source: Other Acute Inpatient Hospital | Attending: Surgery

## 2021-12-13 DIAGNOSIS — E039 Hypothyroidism, unspecified: Secondary | ICD-10-CM | POA: Diagnosis present

## 2021-12-13 DIAGNOSIS — Z681 Body mass index (BMI) 19 or less, adult: Secondary | ICD-10-CM

## 2021-12-13 DIAGNOSIS — I2510 Atherosclerotic heart disease of native coronary artery without angina pectoris: Secondary | ICD-10-CM | POA: Diagnosis present

## 2021-12-13 DIAGNOSIS — R64 Cachexia: Secondary | ICD-10-CM | POA: Diagnosis present

## 2021-12-13 DIAGNOSIS — Z7982 Long term (current) use of aspirin: Secondary | ICD-10-CM

## 2021-12-13 DIAGNOSIS — Z90710 Acquired absence of both cervix and uterus: Secondary | ICD-10-CM

## 2021-12-13 DIAGNOSIS — K5900 Constipation, unspecified: Secondary | ICD-10-CM | POA: Diagnosis not present

## 2021-12-13 DIAGNOSIS — Z66 Do not resuscitate: Secondary | ICD-10-CM | POA: Diagnosis present

## 2021-12-13 DIAGNOSIS — F0390 Unspecified dementia without behavioral disturbance: Secondary | ICD-10-CM | POA: Diagnosis present

## 2021-12-13 DIAGNOSIS — I724 Aneurysm of artery of lower extremity: Secondary | ICD-10-CM | POA: Diagnosis present

## 2021-12-13 DIAGNOSIS — Z9049 Acquired absence of other specified parts of digestive tract: Secondary | ICD-10-CM

## 2021-12-13 DIAGNOSIS — I7123 Aneurysm of the descending thoracic aorta, without rupture: Secondary | ICD-10-CM

## 2021-12-13 DIAGNOSIS — I729 Aneurysm of unspecified site: Secondary | ICD-10-CM

## 2021-12-13 DIAGNOSIS — D509 Iron deficiency anemia, unspecified: Secondary | ICD-10-CM | POA: Diagnosis present

## 2021-12-13 DIAGNOSIS — Z79890 Hormone replacement therapy (postmenopausal): Secondary | ICD-10-CM

## 2021-12-13 DIAGNOSIS — Z87891 Personal history of nicotine dependence: Secondary | ICD-10-CM

## 2021-12-13 DIAGNOSIS — T82310A Breakdown (mechanical) of aortic (bifurcation) graft (replacement), initial encounter: Principal | ICD-10-CM | POA: Diagnosis present

## 2021-12-13 DIAGNOSIS — E43 Unspecified severe protein-calorie malnutrition: Secondary | ICD-10-CM | POA: Diagnosis present

## 2021-12-13 DIAGNOSIS — Z515 Encounter for palliative care: Secondary | ICD-10-CM

## 2021-12-13 DIAGNOSIS — Z79899 Other long term (current) drug therapy: Secondary | ICD-10-CM

## 2021-12-13 DIAGNOSIS — R109 Unspecified abdominal pain: Secondary | ICD-10-CM | POA: Diagnosis present

## 2021-12-13 DIAGNOSIS — Y832 Surgical operation with anastomosis, bypass or graft as the cause of abnormal reaction of the patient, or of later complication, without mention of misadventure at the time of the procedure: Secondary | ICD-10-CM | POA: Diagnosis present

## 2021-12-13 DIAGNOSIS — N261 Atrophy of kidney (terminal): Secondary | ICD-10-CM | POA: Diagnosis present

## 2021-12-13 DIAGNOSIS — Z8673 Personal history of transient ischemic attack (TIA), and cerebral infarction without residual deficits: Secondary | ICD-10-CM

## 2021-12-13 DIAGNOSIS — I959 Hypotension, unspecified: Secondary | ICD-10-CM | POA: Diagnosis not present

## 2021-12-13 DIAGNOSIS — N1832 Chronic kidney disease, stage 3b: Secondary | ICD-10-CM | POA: Diagnosis present

## 2021-12-13 DIAGNOSIS — I129 Hypertensive chronic kidney disease with stage 1 through stage 4 chronic kidney disease, or unspecified chronic kidney disease: Secondary | ICD-10-CM | POA: Diagnosis present

## 2021-12-13 HISTORY — PX: PSEUDOANEURYSM REPAIR: IMG2624

## 2021-12-13 LAB — COMPREHENSIVE METABOLIC PANEL
ALT: 27 U/L (ref 0–55)
AST (SGOT): 52 U/L — ABNORMAL HIGH (ref 5–41)
Albumin/Globulin Ratio: 0.3 — ABNORMAL LOW (ref 0.9–2.2)
Albumin: 1.7 g/dL — ABNORMAL LOW (ref 3.5–5.0)
Alkaline Phosphatase: 77 U/L (ref 37–117)
Anion Gap: 11 (ref 5.0–15.0)
BUN: 32 mg/dL — ABNORMAL HIGH (ref 7.0–21.0)
Bilirubin, Total: 0.7 mg/dL (ref 0.2–1.2)
CO2: 24 mEq/L (ref 17–29)
Calcium: 8.9 mg/dL (ref 7.9–10.2)
Chloride: 107 mEq/L (ref 99–111)
Creatinine: 1.2 mg/dL — ABNORMAL HIGH (ref 0.4–1.0)
Globulin: 6.2 g/dL — ABNORMAL HIGH (ref 2.0–3.6)
Glucose: 81 mg/dL (ref 70–100)
Potassium: 3.9 mEq/L (ref 3.5–5.3)
Protein, Total: 7.9 g/dL (ref 6.0–8.3)
Sodium: 142 mEq/L (ref 135–145)

## 2021-12-13 LAB — TYPE AND SCREEN
AB Screen Gel: POSITIVE
ABO Rh: AB POS

## 2021-12-13 LAB — DIRECT ANTIGLOBULIN TEST: Direct Coombs, IgG: POSITIVE

## 2021-12-13 LAB — GFR: EGFR: 42.7

## 2021-12-13 LAB — ANTIBODY IDENTIFICATION - REFLEX: Antibody ID: POSITIVE

## 2021-12-13 LAB — ELUTION REFLEX: Elution: NEGATIVE

## 2021-12-13 LAB — DIRECT COOMBS - C3 REFLEX: Anti-C3 Coombs: NEGATIVE

## 2021-12-13 SURGERY — PSEUDOANEURYSM REPAIR
Anesthesia: Anesthesia Choice | Laterality: Left

## 2021-12-13 MED ORDER — ACETAMINOPHEN 325 MG PO TABS
650.0000 mg | ORAL_TABLET | Freq: Four times a day (QID) | ORAL | Status: DC | PRN
Start: 2021-12-13 — End: 2021-12-18
  Administered 2021-12-13 – 2021-12-14 (×2): 650 mg via ORAL
  Filled 2021-12-13 (×2): qty 2

## 2021-12-13 MED ORDER — ASPIRIN 81 MG PO TBEC
81.0000 mg | DELAYED_RELEASE_TABLET | Freq: Every day | ORAL | Status: DC
Start: 2021-12-13 — End: 2021-12-18
  Administered 2021-12-13 – 2021-12-18 (×6): 81 mg via ORAL
  Filled 2021-12-13 (×6): qty 1

## 2021-12-13 MED ORDER — THROMBIN 5000 UNITS EX SOLR (WRAP)
Freq: Once | CUTANEOUS | Status: AC
Start: 2021-12-13 — End: 2021-12-13
  Administered 2021-12-13: 17:00:00 800 [IU] via TOPICAL
  Filled 2021-12-13: qty 5000

## 2021-12-13 MED ORDER — LACTATED RINGERS IV SOLN
INTRAVENOUS | Status: DC
Start: 2021-12-13 — End: 2021-12-17

## 2021-12-13 MED ORDER — POTASSIUM CHLORIDE 10 MEQ/100ML IV SOLN
10.0000 meq | INTRAVENOUS | Status: DC | PRN
Start: 2021-12-13 — End: 2021-12-18

## 2021-12-13 MED ORDER — ALBUTEROL SULFATE HFA 108 (90 BASE) MCG/ACT IN AERS
2.0000 | INHALATION_SPRAY | RESPIRATORY_TRACT | Status: DC | PRN
Start: 2021-12-13 — End: 2021-12-18
  Filled 2021-12-13: qty 8

## 2021-12-13 MED ORDER — LIDOCAINE HCL (PF) 1 % IJ SOLN
INTRAMUSCULAR | Status: AC
Start: 2021-12-13 — End: ?
  Filled 2021-12-13: qty 10

## 2021-12-13 MED ORDER — EPINEPHRINE HCL 0.1 MG/ML IJ/IV SOSY (WRAP)
PREFILLED_SYRINGE | Status: DC
Start: 2021-12-13 — End: 2021-12-13
  Filled 2021-12-13: qty 10

## 2021-12-13 MED ORDER — DEXTROSE 10 % IV BOLUS
12.5000 g | INTRAVENOUS | Status: DC | PRN
Start: 2021-12-13 — End: 2021-12-18

## 2021-12-13 MED ORDER — ALLOPURINOL 100 MG PO TABS
300.0000 mg | ORAL_TABLET | Freq: Every day | ORAL | Status: DC
Start: 2021-12-13 — End: 2021-12-18
  Administered 2021-12-13 – 2021-12-18 (×6): 300 mg via ORAL
  Filled 2021-12-13 (×6): qty 3

## 2021-12-13 MED ORDER — ONDANSETRON 4 MG PO TBDP
4.0000 mg | ORAL_TABLET | Freq: Four times a day (QID) | ORAL | Status: DC | PRN
Start: 2021-12-13 — End: 2021-12-18

## 2021-12-13 MED ORDER — DRONABINOL 2.5 MG PO CAPS
2.5000 mg | ORAL_CAPSULE | Freq: Two times a day (BID) | ORAL | Status: DC
Start: 2021-12-13 — End: 2021-12-18
  Administered 2021-12-14 – 2021-12-17 (×5): 2.5 mg via ORAL
  Filled 2021-12-13 (×6): qty 1

## 2021-12-13 MED ORDER — NALOXONE HCL 0.4 MG/ML IJ SOLN (WRAP)
0.2000 mg | INTRAMUSCULAR | Status: DC | PRN
Start: 2021-12-13 — End: 2021-12-14

## 2021-12-13 MED ORDER — FAMOTIDINE 20 MG PO TABS
20.0000 mg | ORAL_TABLET | Freq: Two times a day (BID) | ORAL | Status: DC
Start: 2021-12-13 — End: 2021-12-14
  Administered 2021-12-13 (×2): 20 mg via ORAL
  Filled 2021-12-13 (×2): qty 1

## 2021-12-13 MED ORDER — ATROPINE SULFATE 0.1 MG/ML IJ SOLN (WRAP)
INTRAMUSCULAR | Status: DC
Start: 2021-12-13 — End: 2021-12-13
  Filled 2021-12-13: qty 10

## 2021-12-13 MED ORDER — POTASSIUM CHLORIDE CRYS ER 20 MEQ PO TBCR
0.0000 meq | EXTENDED_RELEASE_TABLET | ORAL | Status: DC | PRN
Start: 2021-12-13 — End: 2021-12-18

## 2021-12-13 MED ORDER — ONDANSETRON HCL 4 MG/2ML IJ SOLN
4.0000 mg | Freq: Four times a day (QID) | INTRAMUSCULAR | Status: DC | PRN
Start: 2021-12-13 — End: 2021-12-18

## 2021-12-13 MED ORDER — GLUCOSE 40 % PO GEL (WRAP)
15.0000 g | ORAL | Status: DC | PRN
Start: 2021-12-13 — End: 2021-12-18

## 2021-12-13 MED ORDER — GLUCAGON 1 MG IJ SOLR (WRAP)
1.0000 mg | INTRAMUSCULAR | Status: DC | PRN
Start: 2021-12-13 — End: 2021-12-18

## 2021-12-13 MED ORDER — LIDOCAINE HCL (CARDIAC) 100 MG/5ML IV (WRAP)
PREFILLED_SYRINGE | INTRAVENOUS | Status: DC
Start: 2021-12-13 — End: 2021-12-13
  Filled 2021-12-13: qty 5

## 2021-12-13 MED ORDER — NALOXONE HCL 0.4 MG/ML IJ SOLN (WRAP)
0.2000 mg | INTRAMUSCULAR | Status: DC | PRN
Start: 2021-12-13 — End: 2021-12-18

## 2021-12-13 MED ORDER — MELATONIN 3 MG PO TABS
3.0000 mg | ORAL_TABLET | Freq: Every evening | ORAL | Status: DC | PRN
Start: 2021-12-13 — End: 2021-12-18

## 2021-12-13 MED ORDER — DEXTROSE 50 % IV SOLN
12.5000 g | INTRAVENOUS | Status: DC | PRN
Start: 2021-12-13 — End: 2021-12-18

## 2021-12-13 MED ORDER — MAGNESIUM SULFATE IN D5W 1-5 GM/100ML-% IV SOLN
1.0000 g | INTRAVENOUS | Status: DC | PRN
Start: 2021-12-13 — End: 2021-12-18

## 2021-12-13 MED ORDER — ATORVASTATIN CALCIUM 40 MG PO TABS
40.0000 mg | ORAL_TABLET | Freq: Every day | ORAL | Status: DC
Start: 2021-12-13 — End: 2021-12-18
  Administered 2021-12-13 – 2021-12-18 (×6): 40 mg via ORAL
  Filled 2021-12-13 (×6): qty 1

## 2021-12-13 MED ORDER — LEVOTHYROXINE SODIUM 25 MCG PO TABS
25.0000 ug | ORAL_TABLET | Freq: Every day | ORAL | Status: DC
Start: 2021-12-14 — End: 2021-12-18
  Administered 2021-12-14 – 2021-12-18 (×5): 25 ug via ORAL
  Filled 2021-12-13 (×5): qty 1

## 2021-12-13 MED ORDER — ACETAMINOPHEN 500 MG PO TABS
500.0000 mg | ORAL_TABLET | Freq: Four times a day (QID) | ORAL | Status: DC | PRN
Start: 2021-12-13 — End: 2021-12-14
  Administered 2021-12-13: 500 mg via ORAL
  Filled 2021-12-13: qty 1

## 2021-12-13 MED ORDER — CYPROHEPTADINE HCL 4 MG PO TABS
4.0000 mg | ORAL_TABLET | Freq: Three times a day (TID) | ORAL | Status: DC | PRN
Start: 2021-12-13 — End: 2021-12-18
  Filled 2021-12-13: qty 1

## 2021-12-13 MED ORDER — BISOPROLOL FUMARATE 5 MG PO TABS
5.0000 mg | ORAL_TABLET | Freq: Every day | ORAL | Status: DC
Start: 2021-12-13 — End: 2021-12-18
  Administered 2021-12-13 – 2021-12-18 (×5): 5 mg via ORAL
  Filled 2021-12-13 (×6): qty 1

## 2021-12-13 MED ORDER — POLYETHYLENE GLYCOL 3350 17 G PO PACK
17.0000 g | PACK | Freq: Every day | ORAL | Status: DC | PRN
Start: 2021-12-13 — End: 2021-12-14
  Administered 2021-12-13: 22:00:00 17 g via ORAL
  Filled 2021-12-13: qty 1

## 2021-12-13 MED ORDER — SENNOSIDES-DOCUSATE SODIUM 8.6-50 MG PO TABS
1.0000 | ORAL_TABLET | Freq: Every evening | ORAL | Status: DC | PRN
Start: 2021-12-13 — End: 2021-12-14
  Administered 2021-12-13: 22:00:00 1 via ORAL
  Filled 2021-12-13: qty 1

## 2021-12-13 MED ORDER — OXYCODONE HCL 5 MG PO TABS
5.0000 mg | ORAL_TABLET | Freq: Once | ORAL | Status: AC
Start: 2021-12-13 — End: 2021-12-13
  Administered 2021-12-13: 21:00:00 5 mg via ORAL
  Filled 2021-12-13: qty 1

## 2021-12-13 MED ORDER — IOHEXOL 350 MG/ML IV SOLN
100.0000 mL | Freq: Once | INTRAVENOUS | Status: AC | PRN
Start: 2021-12-13 — End: 2021-12-13
  Administered 2021-12-13: 12:00:00 90 mL via INTRAVENOUS

## 2021-12-13 MED ORDER — POTASSIUM & SODIUM PHOSPHATES 280-160-250 MG PO PACK
2.0000 | PACK | ORAL | Status: DC | PRN
Start: 2021-12-13 — End: 2021-12-18

## 2021-12-13 SURGICAL SUPPLY — 4 items
KIT MCTRDCR TUNG NTNL MAX MNSTCK 4FR 21 (Introducer) ×1
KIT MICROINTRODUCER L7 CM MAX COAXIAL (Introducer) ×1
KIT MICROINTRODUCER L7 CM MAX COAXIAL GUIDEWIRE ECHOGENIC NEEDLE OD4 (Introducer) ×1 IMPLANT
KIT MNSTK MAX NTNL ECHO 4F STD (Introducer) ×1

## 2021-12-13 NOTE — H&P (Signed)
CARDIOVASCULAR SURGERY   ADMISSION HISTORY AND PHYSICAL EXAM      Date Time: 12/13/21 3:09 PM  Patient Name: [facility]  Attending Physician: Halford Decamp, MD    Assessment/Plan:   Hx of Type A and residual Type B aortic dissection, s/p open repair of Type A in '21 [facility]), and s/p TEVAR (08/06/21) by Drs. Sullivan Lone and [place].         -  Maintain SBP <130 and HR <80  Left flank pain, acute onset; CTA c/a/p reveals multiple Type 3 endoleaks and significant interval increase in size of the excluded aneurysm.         -  CTA reviewed by Dr. Alycia Rossetti and Dr. Aura Dials, patient not a candidate for surgical or vascular    Intervention- consider other non-vascular causes for her Left side pain  CKD/ atrophic kidney, IV hydration post contrast  Anorexia, continue marinol BID  HTN, controlled on home zebeta    History of Present Illness:   Jessica [name] is a 86 y.o. female pmhx of atrophic kidney, CKD, CAD, TIA, expanding aortic aneurysm and chronic dissection s/p TEVAR with Dr. Alycia Rossetti on 08/06/2021 who presents to outside ED with Flank pain.  Pt underwent CTA that showed e/o endoleaks at multiple parts of stent grafts.      Past Medical History:     Past Medical History:   Diagnosis Date    Atrophic kidney     Right    CAD (coronary artery disease)     Dissection of thoracic aorta     Type A dissection    Humerus fracture 05/2021    L Arm in sling s/p fall per dtr    Iron deficiency anemia     TIA (transient ischemic attack) 07/01/2001     Descending Aortic Aneurysm   Chronic Dissection of Descending Aorta  CAD  Dementia  Hx of Repair of Type A Aortic Dissection '21  HTN  Chronic anemia    Past Surgical History:     Past Surgical History:   Procedure Laterality Date    APPENDECTOMY (OPEN)      GLAUCOMA SURGERY Bilateral     HYSTERECTOMY      MOUTH SURGERY      tonsillectomy    s/p hemiarch repair      THORACIC AORTIC STENT GRAFT (TAA) N/A 08/06/2021    Procedure: TEVAR;  Surgeon: Halford Decamp, MD;  Location: [facility]  CARDIAC CATH;  Service: Cardiovascular;  Laterality: N/A;       Family History:   Noncontributory    Social History:     Social History     Socioeconomic History    Marital status: Unknown     Spouse name: Not on file    Number of children: Not on file    Years of education: Not on file    Highest education level: Not on file   Occupational History    Not on file   Tobacco Use    Smoking status: Former     Packs/day: 1.00     Years: 20.00     Pack years: 20.00     Types: Cigarettes     Quit date: 1990     Years since quitting: 33.0    Smokeless tobacco: Never   Vaping Use    Vaping Use: Never used   Substance and Sexual Activity    Alcohol use: Not Currently    Drug use: Never    Sexual activity: Not on  file   Other Topics Concern    Not on file   Social History Narrative    Not on file     Social Determinants of Health     Financial Resource Strain: Not on file   Food Insecurity: Not on file   Transportation Needs: Not on file   Physical Activity: Not on file   Stress: Not on file   Social Connections: Not on file   Intimate Partner Violence: Not on file   Housing Stability: Not on file       Allergies:   No Known Allergies    Medications:     Medications Prior to Admission   Medication Sig    acetaminophen (TYLENOL) 325 MG tablet Take 2 tablets (650 mg total) by mouth every 4 (four) hours as needed for Pain    albuterol sulfate HFA (PROVENTIL) 108 (90 Base) MCG/ACT inhaler Inhale 2 puffs into the lungs every 4 (four) hours as needed for Wheezing    allopurinol (ZYLOPRIM) 300 MG tablet Take 300 mg by mouth daily    ascorbic acid (VITAMIN C) 100 MG tablet Take 100 mg by mouth daily    aspirin EC 81 MG EC tablet Take 1 tablet (81 mg total) by mouth daily    atorvastatin (LIPITOR) 40 MG tablet Take 40 mg by mouth daily    bisoprolol (ZEBETA) 5 MG tablet Take 2.5 mg by mouth daily    cyproheptadine (PERIACTIN) 4 MG tablet Take 4 mg by mouth 3 (three) times daily as needed (Patient not taking: Reported on 09/12/2021)     digoxin (LANOXIN) 0.25 MG tablet Take 125 mcg by mouth    dronabinol (MARINOL) 2.5 MG capsule Take 1 capsule by mouth 2 (two) times daily    famotidine (PEPCID) 40 MG tablet Take 40 mg by mouth daily    ferrous sulfate 325 (65 FE) MG tablet Take 325 mg by mouth every morning with breakfast    furosemide (LASIX) 40 MG tablet Take 40 mg by mouth daily    Latanoprost 0.005 % Emulsion Apply to eye    levothyroxine (SYNTHROID) 25 MCG tablet Take 25 mcg by mouth    polyethylene glycol (MIRALAX) 17 g packet Take 17 g by mouth daily as needed (constipation)    senna-docusate (PERICOLACE) 8.6-50 MG per tablet Take 1 tablet by mouth nightly as needed for Constipation    vitamin D (CHOLECALCIFEROL) 25 MCG (1000 UT) tablet Take 1,000 Units by mouth daily       Review of Systems:   A comprehensive and complete review of systems was reviewed and are negative.    Physical Exam:     Vitals:    12/13/21 1215   BP: 124/69   Pulse: 99   Resp:    Temp:    SpO2: 95%     Neuro: A+Ox3, MAE, non-focal  Cardiac: RRR, no m/r/g  Lungs: CTA, respirations even and unlabored  Abdomen: soft, non-tender, non-distended, +BS  Extremities: warm, palpable distal pulses, no edema  Wounds: well healed mid-sternal scar      Labs:   CBC:   No results for input(s): WBC, HGB, HCT, PLT in the last 72 hours.  BMP:   Recent Labs     12/13/21  0803   Sodium 142   Potassium 3.9   Chloride 107   CO2 24   BUN 32.0*   Creatinine 1.2*   Glucose 81   Calcium 8.9     LFTs:   Recent Labs  12/13/21  0803   AST (SGOT) 52*   ALT 27   Alkaline Phosphatase 77   Bilirubin, Total 0.7   Protein, Total 7.9   Albumin 1.7*     INR: No results for input(s): PT, INR, APTT in the last 72 hours.  ABG: No results for input(s): PHART, PO2ART, PCO2ART, HCO3ART, BEART in the last 72 hours.    Rads:       Signed by: Marton Redwood, NP  Cardiovascular Surgery

## 2021-12-13 NOTE — Sedation Documentation (Addendum)
Patient with desaturation while flat on 2L NC (baseline requirements). RN placed patient on simple mask to recover.

## 2021-12-13 NOTE — Sedation Documentation (Signed)
Left groin pseudoaneurysm repair completed without complication.     Patient tolerated procedure well. VS stable, on 4L NC 99% oxygen saturation.     Patient denies pain. Report called to receiving RN, Yvonna Alanis.     To be transported to room 264 via bed.

## 2021-12-13 NOTE — Consults (Addendum)
SURGICAL CONSULTATION  Team 2: Vascular Surgery, [facility] [phone]    Date Time: 12/13/21 4:27 PM  Patient Name: Jessica [name]  Attending Physician: De Blanch  Chief Complaint: L Flank Pain s/p TEVAR  Consult Request from: Clarene Duke MD  Hospital Day: 1     History    Jessica [name] is a 86 y.o. female with PMHx of HTN, CKD, CAD, and TIA who presents from ER after transfer from [facility] where she presented with L flank pain. Patient is s/p TEVAR on 08/06/21 with Drs. Sullivan Lone, and [place]. She has a surgical history of open type A repair in 2021 in [place]. Per patient and niece who is bedside, patient's left flank pain has been waxing and waning since last August's procedure with recent escalation. Not worsened by eating. Patient reports 60 lb weight loss since the fall with anorexia of unclear etiology. No f/c. Known atopic R kidney.    Patient has not had imaging follow up since procedure in August. CTA post hydration concerning for Type III and Type IB endoleak with aneurysm sac growth. Also noted to have L CFA pseudoaneurysm.    Assessment:   Jessica [name] is a 86 y.o. female who presents with enlarging TAAA with associated dissection. Upon my review of the CTA, there is likely a Type III and IB endoleak. Surgical exclusion of the aneurysm via open or endovascular means is prohibitive at her age in current overall condition. With regards to patient's pain, it certainly anatomically could be from the aneurysm however the longstanding duration would suggest it is not related.  Plan:   Pain control  Medical workup for L flank pain  Consider geriatrics consult  IR consult for thrombin injection of PSA  Plan discussed bedside with patient and niece. All questions answered.    Thank you for the Consult.  Please call with any questions or concerns    Labs:     Hematology   No results for input(s): WBC, HGB, HCT, PLT in the last 72 hours.     Coagulation   No results for input(s): PT, INR,  PTT in the last 72 hours.    Chemistry   Recent Labs     12/13/21  0803   Sodium 142   Potassium 3.9   Chloride 107   CO2 24   BUN 32.0*   Creatinine 1.2*   Glucose 81   Calcium 8.9        Liver Function Tests   Recent Labs     12/13/21  0803   AST (SGOT) 52*   ALT 27   Alkaline Phosphatase 77   Protein, Total 7.9   Albumin 1.7*   Bilirubin, Total 0.7           Problem List:     Patient Active Problem List   Diagnosis    AAA (abdominal aortic aneurysm) without rupture    Aneurysm of descending thoracic aorta    Flank pain    Pseudoaneurysm       Past Medical History:     Past Medical History:   Diagnosis Date    Atrophic kidney     Right    CAD (coronary artery disease)     Dissection of thoracic aorta     Type A dissection    Humerus fracture 05/2021    L Arm in sling s/p fall per dtr    Iron deficiency anemia     TIA (transient ischemic attack) 07/01/2001  Past Surgical History:     Past Surgical History:   Procedure Laterality Date    APPENDECTOMY (OPEN)      GLAUCOMA SURGERY Bilateral     HYSTERECTOMY      MOUTH SURGERY      tonsillectomy    s/p hemiarch repair      THORACIC AORTIC STENT GRAFT (TAA) N/A 08/06/2021    Procedure: TEVAR;  Surgeon: Halford Decamp, MD;  Location: [facility]ARDI[facility];  Service: Cardiovascular;  Laterality: N/A;        Family History:   The patient has a family history of  No family history on file.    Social History:     Social History     Socioeconomic History    Marital status: Unknown   Tobacco Use    Smoking status: Former     Packs/day: 1.00     Years: 20.00     Pack years: 20.00     Types: Cigarettes     Quit date: 1990     Years since quitting: 33.0    Smokeless tobacco: Never   Vaping Use    Vaping Use: Never used   Substance and Sexual Activity    Alcohol use: Not Currently    Drug use: Never     Allergies:   No Known Allergies    Medications:     Prior to Admission medications    Medication Sig Start Date End Date Taking? Authorizing Provider   acetaminophen (TYLENOL) 325 MG  tablet Take 2 tablets (650 mg total) by mouth every 4 (four) hours as needed for Pain 08/10/21   Dukes, Cristal [name] D, PA   albuterol sulfate HFA (PROVENTIL) 108 (90 Base) MCG/ACT inhaler Inhale 2 puffs into the lungs every 4 (four) hours as needed for Wheezing    [provider]   allopurinol (ZYLOPRIM) 300 MG tablet Take 300 mg by mouth daily    [provider]   ascorbic acid (VITAMIN C) 100 MG tablet Take 100 mg by mouth daily    [provider]   aspirin EC 81 MG EC tablet Take 1 tablet (81 mg total) by mouth daily 08/11/21   Dukes, Cristal [name] D, PA   atorvastatin (LIPITOR) 40 MG tablet Take 40 mg by mouth daily    [provider]   bisoprolol (ZEBETA) 5 MG tablet Take 2.5 mg by mouth daily    [provider]   cyproheptadine (PERIACTIN) 4 MG tablet Take 4 mg by mouth 3 (three) times daily as needed  Patient not taking: Reported on 09/12/2021    [provider]   digoxin (LANOXIN) 0.25 MG tablet Take 125 mcg by mouth    [provider]   dronabinol (MARINOL) 2.5 MG capsule Take 1 capsule by mouth 2 (two) times daily 08/26/21   [provider]   famotidine (PEPCID) 40 MG tablet Take 40 mg by mouth daily    [provider]   ferrous sulfate 325 (65 FE) MG tablet Take 325 mg by mouth every morning with breakfast    [provider]   furosemide (LASIX) 40 MG tablet Take 40 mg by mouth daily    [provider]   Latanoprost 0.005 % Emulsion Apply to eye    [provider]   levothyroxine (SYNTHROID) 25 MCG tablet Take 25 mcg by mouth    [provider]   polyethylene glycol (MIRALAX) 17 g packet Take 17 g by mouth daily as needed (constipation)  08/10/21   Dukes, Nile Dear, PA   senna-docusate (PERICOLACE) 8.6-50 MG per tablet Take 1 tablet by mouth nightly as needed for Constipation 08/10/21   Dukes, Nile Dear, PA   vitamin D (CHOLECALCIFEROL) 25 MCG (1000 UT) tablet Take 1,000 Units by mouth daily     [provider]       Review of Systems:     A complete review of systems was performed and was negative except for the pertinent positives documented in the HPI.     Physical Exam:   Current Vitals:   Vitals:    12/13/21 1549   BP: 123/65   Pulse: 89   Resp: 19   Temp: 98.4 F (36.9 C)   SpO2: 95%       Appears in pain, cachectic  Non-labored  RRR  Soft, NT, ND  B/l femoral, radial pulses intact. L CFA pulse prominent.  Palp DP b/l

## 2021-12-13 NOTE — Brief Op Note (Signed)
BRIEF IR PROCEDURE NOTE    Date Time: 12/13/21 5:29 PM    Patient Name:   Jessica [name]    Date of Operation:   12/13/2021    Providers Performing:   Surgeon(s):  Precious Gilding, MD    Assistant (s):  RT  Operative Procedure:   Procedure(s):  PSEUDOANEURYSM REPAIR    Preoperative Diagnosis:   Pre-Op Diagnosis Codes:     * Pseudoaneurysm [I72.9]    Postoperative Diagnosis:   * No post-op diagnosis entered *    Anesthesia:   (  ) FENTANYL  (  ) VERSED  ( x ) LOCAL  (  ) GENERAL ANESTHESIA (DEPT OF ANESTHESIOLOGY) )    Estimated Blood Loss:   0CC      CONTRAST   NONE    RADIATION DOSE    NONE      Findings:   SUCCESSFUL THROMBIN INJECTION OF A LEFT CFA 5.1CM PSEUDOANEURYSM    800 units thrombin injected.    CFA, PFA, AND proximal SFA remain patent.      Pre-procedure time out was performed    Complications:   NONE      Signed by: Rex Kras, MD                                                                              [facility] CARDIAC CATH

## 2021-12-13 NOTE — H&P (Signed)
BRIEF VIR H&P    Date Time: 12/13/21 4:38 PM    PROCEDURALIST COMMENTS BELOW:   86 yo F with history of type A aortic dissection s/p TEVAR on 08/06/2021 presents with left flank pain found to have left CFA pseudoaneurysm who presents for U/S guided thrombin injection.    INDICATIONS:   Procedure(s):  PSEUDOANEURYSM REPAIR    Preoperative Diagnosis:   Pre-Op Diagnosis Codes:     * Pseudoaneurysm [I72.9]    PAST MEDICAL HISTORY:     Past Medical History:   Diagnosis Date    Atrophic kidney     Right    CAD (coronary artery disease)     Dissection of thoracic aorta     Type A dissection    Humerus fracture 05/2021    L Arm in sling s/p fall per dtr    Iron deficiency anemia     TIA (transient ischemic attack) 07/01/2001       ALLERGIES:   No Known Allergies    PAST SURGICAL HISTORY     Past Surgical History:   Procedure Laterality Date    APPENDECTOMY (OPEN)      GLAUCOMA SURGERY Bilateral     HYSTERECTOMY      MOUTH SURGERY      tonsillectomy    s/p hemiarch repair      THORACIC AORTIC STENT GRAFT (TAA) N/A 08/06/2021    Procedure: TEVAR;  Surgeon: Halford Decamp, MD;  Location: [facility];  Service: Cardiovascular;  Laterality: N/A;       REVIEW OF SYSTEMS REVIEWED:   YES  ( x )      CURRENT MEDICATION     Prior to Admission medications    Medication Sig Start Date End Date Taking? Authorizing Provider   acetaminophen (TYLENOL) 325 MG tablet Take 2 tablets (650 mg total) by mouth every 4 (four) hours as needed for Pain 08/10/21   Dukes, Cristal Deer D, PA   albuterol sulfate HFA (PROVENTIL) 108 (90 Base) MCG/ACT inhaler Inhale 2 puffs into the lungs every 4 (four) hours as needed for Wheezing    [provider]   allopurinol (ZYLOPRIM) 300 MG tablet Take 300 mg by mouth daily    [provider]   ascorbic acid (VITAMIN C) 100 MG tablet Take 100 mg by mouth daily    [provider]   aspirin EC 81 MG EC tablet Take 1 tablet (81 mg total) by mouth daily 08/11/21   Dukes, Cristal Deer D, PA    atorvastatin (LIPITOR) 40 MG tablet Take 40 mg by mouth daily    [provider]   bisoprolol (ZEBETA) 5 MG tablet Take 2.5 mg by mouth daily    [provider]   cyproheptadine (PERIACTIN) 4 MG tablet Take 4 mg by mouth 3 (three) times daily as needed  Patient not taking: Reported on 09/12/2021    [provider]   digoxin (LANOXIN) 0.25 MG tablet Take 125 mcg by mouth    [provider]   dronabinol (MARINOL) 2.5 MG capsule Take 1 capsule by mouth 2 (two) times daily 08/26/21   [provider]   famotidine (PEPCID) 40 MG tablet Take 40 mg by mouth daily    [provider]   ferrous sulfate 325 (65 FE) MG tablet Take 325 mg by mouth every morning with breakfast    [provider]   furosemide (LASIX) 40 MG tablet Take 40 mg by mouth daily    [provider]   Latanoprost 0.005 % Emulsion Apply to eye    [provider]   levothyroxine (SYNTHROID) 25 MCG tablet Take 25 mcg by mouth    [provider]   polyethylene glycol (MIRALAX) 17 g packet Take 17 g by mouth daily as needed (constipation) 08/10/21   Dukes, Nile Dear, PA   senna-docusate (PERICOLACE) 8.6-50 MG per tablet Take 1 tablet by mouth nightly as needed for Constipation 08/10/21   Dukes, Nile Dear, PA   vitamin D (CHOLECALCIFEROL) 25 MCG (1000 UT) tablet Take 1,000 Units by mouth daily    [provider]         SOCIAL HISTORY     Social History     Socioeconomic History    Marital status: Unknown     Spouse name: Not on file    Number of children: Not on file    Years of education: Not on file    Highest education level: Not on file   Occupational History    Not on file   Tobacco Use    Smoking status: Former     Packs/day: 1.00     Years: 20.00     Pack years: 20.00     Types: Cigarettes     Quit date: 69     Years since quitting: 33.0    Smokeless tobacco: Never   Vaping Use    Vaping Use: Never used   Substance and Sexual Activity    Alcohol use:  Not Currently    Drug use: Never    Sexual activity: Not on file   Other Topics Concern    Not on file   Social History Narrative    Not on file     Social Determinants of Health     Financial Resource Strain: Not on file   Food Insecurity: Not on file   Transportation Needs: Not on file   Physical Activity: Not on file   Stress: Not on file   Social Connections: Not on file   Intimate Partner Violence: Not on file   Housing Stability: Not on file         PHYSICAL EXAM     AIRWAY CLASSIFICATION:    CLASS I   (  )     CLASS II  ( x )    CLASS III  (  )     CLASS IV  (  )    CARDIAC:   RRR  LUNGS:   NLBRA  ABDOMEN:   Soft, NT  NEURO:   AAO x3  OTHER:       LABS:       Recent Labs   Lab 12/13/21  0803   Glucose 81   BUN 32.0*   Creatinine 1.2*   Sodium 142   CO2 24   Albumin 1.7*   AST (SGOT) 52*   ALT 27           Invalid input(s): PTI    ASA PHYSICAL STATUS   (  )  ASA 1   HEALTHY PATIENT  (  )  ASA 2   MILD SYSTEMIC ILLNESS  (x)  ASA 3   SYSTEMIC DISEASE, NOT INCAPACITATING  (  )  ASA 4   SEVERE SYSTEMIC DISEASE, IS CONSTANT THREAT TO  LIFE  (  )  ASA 5   MORIBUND CONDITION, NOT EXPECTED TO LIVE >24 HOURS            IRRESPECTIVE OF  PROCEDURE  (  )  E           EMERGENCY PROCEDURE     PLANNED SEDATION:   Local anesthesia with 1% lidocaine      (  ) DNR Rescinded for precedure and d/w patient/family member who agree and understand.     CONCLUSION:   PATIENT HAS BEEN REASSESSED IMMEDIATELY PRIOR TO THE PROCEDURE AND IS AN APPROPRIATE CANDIDATE FOR THE PLANNED SEDATION AND   PROCEDURE.  RISKS, BENEFITS AND ALTERNATIVES TO THE PLANNED PROCEDURE AND SEDATION HAVE BEEN EXPLAINED TO THE PATIENT   OR GUARDIAN/FAMILY THOROUGHLY. MAJOR RISKS RE INFECTION, BLEEDING, INTERNAL ORGAN INJURY, AND RECURRENCE WERE DISCUSSED.    (x)  YES  (  )  EMERGENCY CONSENT       Shelene Krage K. Melvyn Neth, MD    [facility]  [facility], Division of Vascular & Interventional Radiology  [phone]  [place] -  [phone]  [phone] [id number]

## 2021-12-13 NOTE — Plan of Care (Incomplete)
Problem: Safety  Goal: Patient will be free from injury during hospitalization  Outcome: Progressing  Goal: Patient will be free from infection during hospitalization  Outcome: Progressing     Problem: Pain  Goal: Pain at adequate level as identified by patient  Outcome: Progressing     Problem: Side Effects from Pain Analgesia  Goal: Patient will experience minimal side effects of analgesic therapy  Outcome: Progressing     Problem: Discharge Barriers  Goal: Patient will be discharged home or other facility with appropriate resources  Outcome: Progressing     Problem: Psychosocial and Spiritual Needs  Goal: Demonstrates ability to cope with hospitalization/illness  Outcome: Progressing     Problem: Moderate/High Fall Risk Score >5  Goal: Patient will remain free of falls  Outcome: Progressing     Problem: Compromised Tissue integrity  Goal: Damaged tissue is healing and protected  Outcome: Progressing  Goal: Nutritional status is improving  Outcome: Progressing    Date/Time:  12/13/21 10:47 PM   Patient Name:  Jessica [name]    Age: 86 y.o.   Room:  FI264/FI264-01     Cardiac Surgery RN Shift   Shift Events:   - n/a    Neuro  Ambulate with          [x]  assist       []  independently  Pain Controlled        [x]  Yes           []  No           Cardiac  BP Controlled [x]  Yes          []  No          PRN meds given []  Yes            [x]  No      Rhythm                    Sinus rhythm      Arrhythmias         []  Yes            [x]  No      Pacing Wires           []  Yes           [x]  No                           Pulmonary                     O2 NC        2L NC from home              [x]  Yes           []  No                    Chest tubes              []  Yes           [x]  No        G.I.   Tolerating Diet          [x]  Yes           []  No      BM last 24 hrs          []  Yes          [x]  No  Last BM Date:  (pt states a week ago)    PPI ppx                    [x]  Yes          []  No      Renal                Foley present             []  Yes          [x]  No                                            Heme  DVT Prophylaxis            [x]  Yes           []  No                   ID   Febrile                     []  Yes          [x]  No        Endocrine   Off insulin drip         [x]  Yes           []  No      BS controlled           [x]  Yes           []  No      _____________________________________________________________________________________         Priority discharge:  []  Yes            [x]  No

## 2021-12-13 NOTE — Nursing Progress Note (Signed)
Pt brought to [facility] room 264 from transport. Pt had iron through IV that was complete upon arrival. Pt reported 10/10 pain when moving into the bed, but went down to 0 once settled. VSS, patient on 2L NC, skin intact. PA aware.

## 2021-12-13 NOTE — Progress Notes (Signed)
Initial Case Management Assessment and Discharge Planning  [facility]   Patient Name: Jessica [name], Jessica [name]   Date of Birth 26-Sep-1935   Attending Physician: Halford Decamp, MD   Primary Care Physician: Kandis Mannan, MD   Length of Stay 0   Reason for Consult / Chief Complaint Flank pain [R10.9]         Situation   Admission DX:   1. Flank pain        A/O Status: X 3    LACE Score: 2    Patient admitted from: elective  Admission Status: inpatient    Health Care Agent: Child  Name: see below         Background     Advanced directive:   <no information>    Code Status:   Full Code     Residence: Multi-story home    PCP: Kandis Mannan, MD  Patient Contact:   [phone] (home)     [phone] (mobile)     Emergency contact:   Extended Emergency Contact Information  Primary Emergency Contact: Henkes,Jessica  Mobile Phone: [phone]  Relation: Daughter  Secondary Emergency Contact: Harshfield,Darryl  Mobile Phone: [phone]  Relation: None      ADL/IADL's: Independent  Previous Level of function: 7 Independent     DME: Front wheeled walker   Single point cane  Wheelchair - manual  Home oxygen-2LO2. Cannot recall the name of the oxygen vendor  Is not currently using the walker, cane or wheelchair    Pharmacy:     [facility] - [place], [place] - [address]  [address]  [place] [address]  Phone: [phone] Fax: [phone]    [id number] Alba Destine, [place] - [phone]  [address]  [facility] [place] [address]  Phone: [phone] Fax: [phone]      Prescription Coverage: Yes    Home Health: The patient is not currently receiving home health services.    Previous SNF/AR: na    COVID Vaccine Status: overdue for booster dose    UAI on file?: No  Transport for discharge? Mode of transportation: Sales executive - Family/Friend to drive patient son or daughter to drive her home  Agreeable to Home Health Services  post-discharge:  Yes     Assessment     BARRIERS TO DISCHARGE: na     Recommendation   D/C Plan A: Home with family    D/C Plan B: Home with home health        Grace Blight MSW  Case Manager  [facility]  [phone]  [email or web address]

## 2021-12-13 NOTE — Sedation Documentation (Signed)
Patient arrived to IR procedure room for pseudoaneurysm repair of left groin.     Patient AAOX4. Informed consent for procedure signed in chart.     Patient in bed in supine position.     Monitors attached. Nasal canula continued at 2L. Vitals per protocol. Padded arm boards in place. Lawyer in place.     Draped and prepped in sterile fashion by Morrie Sheldon.

## 2021-12-14 ENCOUNTER — Inpatient Hospital Stay: Payer: Medicare PPO

## 2021-12-14 LAB — CBC
Absolute NRBC: 0.13 10*3/uL — ABNORMAL HIGH (ref 0.00–0.00)
Hematocrit: 27.5 % — ABNORMAL LOW (ref 34.7–43.7)
Hgb: 7.9 g/dL — ABNORMAL LOW (ref 11.4–14.8)
MCH: 19.8 pg — ABNORMAL LOW (ref 25.1–33.5)
MCHC: 28.7 g/dL — ABNORMAL LOW (ref 31.5–35.8)
MCV: 68.9 fL — ABNORMAL LOW (ref 78.0–96.0)
MPV: 9.6 fL (ref 8.9–12.5)
Nucleated RBC: 1.1 /100 WBC — ABNORMAL HIGH (ref 0.0–0.0)
Platelets: 278 10*3/uL (ref 142–346)
RBC: 3.99 10*6/uL (ref 3.90–5.10)
RDW: 21 % — ABNORMAL HIGH (ref 11–15)
WBC: 11.38 10*3/uL — ABNORMAL HIGH (ref 3.10–9.50)

## 2021-12-14 LAB — COMPREHENSIVE METABOLIC PANEL
ALT: 19 U/L (ref 0–55)
AST (SGOT): 35 U/L (ref 5–41)
Albumin/Globulin Ratio: 0.3 — ABNORMAL LOW (ref 0.9–2.2)
Albumin: 1.7 g/dL — ABNORMAL LOW (ref 3.5–5.0)
Alkaline Phosphatase: 78 U/L (ref 37–117)
Anion Gap: 9 (ref 5.0–15.0)
BUN: 25 mg/dL — ABNORMAL HIGH (ref 7.0–21.0)
Bilirubin, Total: 0.7 mg/dL (ref 0.2–1.2)
CO2: 25 mEq/L (ref 17–29)
Calcium: 9.2 mg/dL (ref 7.9–10.2)
Chloride: 107 mEq/L (ref 99–111)
Creatinine: 1.2 mg/dL — ABNORMAL HIGH (ref 0.4–1.0)
Globulin: 6.1 g/dL — ABNORMAL HIGH (ref 2.0–3.6)
Glucose: 84 mg/dL (ref 70–100)
Potassium: 4 mEq/L (ref 3.5–5.3)
Protein, Total: 7.8 g/dL (ref 6.0–8.3)
Sodium: 141 mEq/L (ref 135–145)

## 2021-12-14 LAB — CBC AND DIFFERENTIAL
Absolute NRBC: 0.1 10*3/uL — ABNORMAL HIGH (ref 0.00–0.00)
Basophils Absolute Automated: 0.01 10*3/uL (ref 0.00–0.08)
Basophils Automated: 0.1 %
Eosinophils Absolute Automated: 0.04 10*3/uL (ref 0.00–0.44)
Eosinophils Automated: 0.3 %
Hematocrit: 23.3 % — ABNORMAL LOW (ref 34.7–43.7)
Hgb: 6.6 g/dL — ABNORMAL LOW (ref 11.4–14.8)
Immature Granulocytes Absolute: 0.07 10*3/uL (ref 0.00–0.07)
Immature Granulocytes: 0.6 %
Lymphocytes Absolute Automated: 1.32 10*3/uL (ref 0.42–3.22)
Lymphocytes Automated: 10.8 %
MCH: 19.1 pg — ABNORMAL LOW (ref 25.1–33.5)
MCHC: 28.3 g/dL — ABNORMAL LOW (ref 31.5–35.8)
MCV: 67.5 fL — ABNORMAL LOW (ref 78.0–96.0)
MPV: 10 fL (ref 8.9–12.5)
Monocytes Absolute Automated: 0.79 10*3/uL (ref 0.21–0.85)
Monocytes: 6.5 %
Neutrophils Absolute: 9.98 10*3/uL — ABNORMAL HIGH (ref 1.10–6.33)
Neutrophils: 81.7 %
Nucleated RBC: 0.8 /100 WBC — ABNORMAL HIGH (ref 0.0–0.0)
Platelets: 280 10*3/uL (ref 142–346)
RBC: 3.45 10*6/uL — ABNORMAL LOW (ref 3.90–5.10)
RDW: 21 % — ABNORMAL HIGH (ref 11–15)
WBC: 12.21 10*3/uL — ABNORMAL HIGH (ref 3.10–9.50)

## 2021-12-14 LAB — CELL MORPHOLOGY
Cell Morphology: ABNORMAL — AB
Platelet Estimate: NORMAL

## 2021-12-14 LAB — GFR: EGFR: 42.7

## 2021-12-14 LAB — DIGOXIN LEVEL: Digoxin Level: 0.3 ng/mL — ABNORMAL LOW (ref 0.5–2.0)

## 2021-12-14 LAB — MAGNESIUM: Magnesium: 2.3 mg/dL (ref 1.6–2.6)

## 2021-12-14 LAB — PT AND APTT
PT INR: 1.1 (ref 0.9–1.1)
PT: 13.4 s — ABNORMAL HIGH (ref 10.1–12.9)
PTT: 26 s — ABNORMAL LOW (ref 27–39)

## 2021-12-14 MED ORDER — LIDOCAINE 5 % EX PTCH
1.0000 | MEDICATED_PATCH | Freq: Every day | CUTANEOUS | Status: DC
Start: 2021-12-14 — End: 2021-12-18
  Administered 2021-12-14 – 2021-12-18 (×5): 1 via TRANSDERMAL
  Filled 2021-12-14 (×4): qty 1

## 2021-12-14 MED ORDER — DIGOXIN 125 MCG PO TABS
0.0625 mg | ORAL_TABLET | Freq: Every day | ORAL | Status: DC
Start: 2021-12-14 — End: 2021-12-15
  Administered 2021-12-14 – 2021-12-15 (×2): 0.0625 mg via ORAL
  Filled 2021-12-14 (×2): qty 1

## 2021-12-14 MED ORDER — VITAMIN D 25 MCG (1000 UT) PO TABS
25.0000 ug | ORAL_TABLET | Freq: Every day | ORAL | Status: DC
Start: 2021-12-14 — End: 2021-12-18
  Administered 2021-12-14 – 2021-12-18 (×5): 25 ug via ORAL
  Filled 2021-12-14 (×5): qty 1

## 2021-12-14 MED ORDER — OXYCODONE HCL 5 MG PO TABS
5.0000 mg | ORAL_TABLET | Freq: Three times a day (TID) | ORAL | Status: DC | PRN
Start: 2021-12-14 — End: 2021-12-15
  Administered 2021-12-15 (×2): 5 mg via ORAL
  Filled 2021-12-14 (×3): qty 1

## 2021-12-14 MED ORDER — FERROUS SULFATE 324 (65 FE) MG PO TBEC
324.0000 mg | DELAYED_RELEASE_TABLET | Freq: Every morning | ORAL | Status: DC
Start: 2021-12-14 — End: 2021-12-18
  Administered 2021-12-14 – 2021-12-18 (×5): 324 mg via ORAL
  Filled 2021-12-14 (×5): qty 1

## 2021-12-14 MED ORDER — FAMOTIDINE 20 MG PO TABS
20.0000 mg | ORAL_TABLET | Freq: Every day | ORAL | Status: DC
Start: 2021-12-14 — End: 2021-12-18
  Administered 2021-12-14 – 2021-12-18 (×5): 20 mg via ORAL
  Filled 2021-12-14 (×5): qty 1

## 2021-12-14 MED ORDER — OXYCODONE HCL 5 MG PO TABS
5.0000 mg | ORAL_TABLET | Freq: Once | ORAL | Status: AC
Start: 2021-12-14 — End: 2021-12-14
  Administered 2021-12-14: 5 mg via ORAL
  Filled 2021-12-14: qty 1

## 2021-12-14 MED ORDER — ASCORBIC ACID 500 MG PO TABS
250.0000 mg | ORAL_TABLET | Freq: Every day | ORAL | Status: DC
Start: 2021-12-14 — End: 2021-12-18
  Administered 2021-12-14 – 2021-12-18 (×5): 250 mg via ORAL
  Filled 2021-12-14 (×5): qty 1

## 2021-12-14 MED ORDER — OXYCODONE HCL 5 MG PO TABS
2.5000 mg | ORAL_TABLET | Freq: Three times a day (TID) | ORAL | Status: DC | PRN
Start: 2021-12-14 — End: 2021-12-14

## 2021-12-14 MED ORDER — ACETAMINOPHEN 500 MG PO TABS
500.0000 mg | ORAL_TABLET | Freq: Four times a day (QID) | ORAL | Status: DC
Start: 2021-12-14 — End: 2021-12-18
  Administered 2021-12-14 – 2021-12-18 (×15): 500 mg via ORAL
  Filled 2021-12-14 (×15): qty 1

## 2021-12-14 MED ORDER — SENNOSIDES-DOCUSATE SODIUM 8.6-50 MG PO TABS
1.0000 | ORAL_TABLET | Freq: Two times a day (BID) | ORAL | Status: DC
Start: 2021-12-14 — End: 2021-12-18
  Administered 2021-12-14 – 2021-12-17 (×5): 1 via ORAL
  Filled 2021-12-14 (×5): qty 1

## 2021-12-14 MED ORDER — BISACODYL 10 MG RE SUPP
10.0000 mg | Freq: Every day | RECTAL | Status: DC
Start: 2021-12-14 — End: 2021-12-18
  Administered 2021-12-14 – 2021-12-15 (×2): 10 mg via RECTAL
  Filled 2021-12-14 (×3): qty 1

## 2021-12-14 MED ORDER — POLYETHYLENE GLYCOL 3350 17 G PO PACK
17.0000 g | PACK | Freq: Every day | ORAL | Status: DC
Start: 2021-12-15 — End: 2021-12-18
  Administered 2021-12-15: 17 g via ORAL
  Filled 2021-12-14: qty 1

## 2021-12-14 NOTE — Progress Notes (Signed)
Interventional Radiology  PROGRESS NOTE    Date Time: 12/14/21 9:04 AM  Patient Name: Jessica [name], Jessica [name]      Assessment:   86 y.o. F w/ hx of Type A and Type B aortic dissection s/p open repair of Type A in 2021 and s/p TEVAR on 08/06/21 who p/w left flank pain found to have multiple endoleaks that have increased in size, as well as, a left CFA PSA. She is now s/p thrombin injection of left CFA PSA on 12/13/21.    - AVSS  - H/H: 7.9/27.5  - WBC: 11.38  - Cr: 1.2    Plan:   - Continue to monitor left groin site  - Continue neurovascular checks  - Post procedure left groin PSA [place] either today or tomorrow. Discussed w/ cardiac surgery team who is aware.   - Rest of care per primary team.     Subjective:   Reports back pain last night and flank pain. Denies left groin pain. No other complaints at this time.     Medications:     Current Facility-Administered Medications   Medication Dose Route Frequency    acetaminophen  500 mg Oral 4 times per day    allopurinol  300 mg Oral Daily    aspirin  81 mg Oral Daily    atorvastatin  40 mg Oral Daily    bisoprolol  5 mg Oral Daily    digoxin  0.0625 mg Oral Daily    dronabinol  2.5 mg Oral BID Meals    famotidine  20 mg Oral Daily    ferrous sulfate  324 mg Oral QAM W/BREAKFAST    levothyroxine  25 mcg Oral Daily at 0600    senna-docusate  1 tablet Oral BID    vitamin C  250 mg Oral Daily    vitamin D  25 mcg Oral Daily         Physical Exam:     Vitals:    12/14/21 0449   BP: 117/64   Pulse: 85   Resp: 19   Temp: 97.5 F (36.4 C)   SpO2: 97%       GEN: WD/WN  female, NAD  HEENT: atraumatic, anicteric, neck supple  LUNGS:  Symmetric air entry  ABDOMEN: Soft, NT/ND  EXTREMITIES: left groin site soft, nontender,, dressing c/d/I,  2+ DP and PT pulses B/L, no edema, no ulcers, no cyanosis, no hyperpigmentation    I & O     Intake and Output Summary (Last 24 hours) at Date Time    Intake/Output Summary (Last 24 hours) at 12/14/2021 0904  Last data filed at 12/14/2021 0500  Gross per 24  hour   Intake 470 ml   Output 600 ml   Net -130 ml         Labs:     Lab Results   Component Value Date/Time    WBC 11.38 (H) 12/14/2021 06:54 AM    HCT 27.5 (L) 12/14/2021 06:54 AM    PLT 278 12/14/2021 06:54 AM    INR 1.1 12/14/2021 04:59 AM    PT 13.4 (H) 12/14/2021 04:59 AM    PTT 26 (L) 12/14/2021 04:59 AM    BUN 25.0 (H) 12/14/2021 04:59 AM    CREAT 1.2 (H) 12/14/2021 04:59 AM    GLU 84 12/14/2021 04:59 AM    K 4.0 12/14/2021 04:59 AM        Rads:     Radiology Results (24 Hour)  Procedure Component Value Units Date/Time    [place] Groin Pseudoaneurysm Left W Dopp [[id number]] Resulted: 12/13/21 1629    Order Status: Sent Updated: 12/13/21 1814    Pseudoaneurysm Repair [[id number]] Resulted: 12/13/21 1648    Order Status: Sent Updated: 12/13/21 1754    [place] Groin Pseudoaneurysm Left W Dopp [[id number]] Collected: 12/13/21 1528    Order Status: Completed Updated: 12/13/21 1535    Narrative:      HISTORY: Left groin pain/swelling following endovascular access.    TECHNIQUE: The left groin was evaluated with high resolution gray scale  imaging, color Doppler, and spectral waveform analysis.      FINDINGS: A pseudoaneurysm measuring 5.1 x 2.4 x 2.9 cm arises from the  left common femoral artery. The neck measures 1 cm in length and 0.3 cm  in width. No soft tissue hematoma is seen. No arteriovenous fistula is  identified.    Although the scope of the exam was limited, no significant arterial  occlusive disease or venous obstruction was apparent in the external  iliac, common femoral, proximal superficial femoral, or proximal  profunda vessels.      Impression:         5.1 cm pseudoaneurysm arising from the left common femoral artery.    [name], MD   12/13/2021 3:33 PM    CT Angiogram Chest Abdomen Pelvis W WO Contrast [[id number]] Collected: 12/13/21 1237    Order Status: Completed Updated: 12/13/21 1324    Narrative:      HISTORY:  Status post thoracic endovascular stent graft repair August  2022, renal  insufficiency, concern for endoleak    COMPARISON: Preoperative CT dated 04/15/2021    TECHNIQUE: CT angiogram of the chest, abdomen, and pelvis was performed  without and with 100 mL of Omnipaque 350 intravenous contrast.   Multiplanar reformatted and 3D maximum intensity projection (MIP) images  were created and reviewed. The following dose reduction techniques were  utilized: automated exposure control and/or adjustment of the mA and/or  KV according to patient size, and the use of an iterative reconstruction  technique.    FINDINGS:  Vascular:    The following measurements were obtained by double oblique measurement:    Aortic root: 4.3 cm  Ascending aorta at right PA: 3.9 cm  Transverse arch: 5 cm  Proximal descending thoracic aorta:8.2 x 7.6cm  Mid descending thoracic aorta: 8.2 x 8.1 cm  At diaphragm: 8.8 x 8.1 cmcm  At SMA: 4 x 4 cm cm    TEVAR:    Status post thoracic endovascular stent graft placement originating at  the ostia the left subclavian which remains widely patent. The thoracic  endograft demonstrates multiple overlapping components and extends to  the supraceliac abdominal aorta. Multiple endoleaks are present.  Proximally, at component overlay at the proximal descending thoracic  aorta, there is a type III endoleak, best visualized on series 9 image  27. A second type III endoleak is best visualized on the coronal images,  series 1301, image 75 and is at the component overlay at the mid arch. A  third type III endoleak is present from component overlay within the mid  to distal descending thoracic aorta. Finally, type IB endoleak is  present at the distal insertion with a large amount of contrast within  the false lumen. The excluded aneurysm has significantly increased in  size along the entire course of the descending thoracic aorta. For  instance, the proximal descending thoracic aorta now measures 8.2 x 7.6  cm,  previously measuring 5.5 cm. The arch vessels are widely patent. The  celiac  axis, superior mesenteric artery, and left renal artery arise  from the true lumen. The inferior mesenteric artery also arises from the  true lumen. The right kidney is markedly atrophied and the right renal  artery arises from the false lumen. The dissection flap extends into the  entire abdominal aorta and terminates in the right common iliac artery  which is ectatic. In addition, there are bilateral focal dissections  within the common femoral arteries. Anterior to the left common femoral  artery, a linear blush of contrast demonstrates a connection to the  adjacent common femoral and may represent a small pseudoaneurysm. This  is just superior to the Perclose device.  3.6 x 3.3 cm left inguinal  hematoma superficial to the left common femoral vasculature.    Status post reconstruction of the ascending thoracic aorta. The level  the sinuses of Valsalva, there is aneurysmal dilatation. A focal  dissection flap is also present along the noncoronary cusp with an  apparent fenestration and appears stable from the prior exam. This does  not involve either coronary artery. The left and right coronary artery  ostia are widely patent.      Chest: The heart is enlarged. The central pulmonary arteries are  dilated. Coronary artery calcification. Small bilateral pleural  effusions and compressive atelectasis. Additional atelectasis within the  left upper lobe secondary to mass effect. Upper lobe predominant  centrilobular emphysema.    Abdomen/Pelvis: Evaluation of the abdominal organs is limited due to the  arterial phase of injection. The liver, spleen, and pancreas are  unchanged. Gallstones are within the gallbladder. Stable thickening of  the adrenals.    The small and large bowel loops are normal in caliber. A large amount  retained stools within the colon. Small free fluid within the dependent  pelvis. The bladder is partially distended with urine.          Impression:           1. 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 Significant interval increase in size of the excluded  aneurysm.  2. Left groin hematoma overlying the left common femoral artery which  demonstrates a focal dissection and a linear blush of contrast which is  suspicious for pseudoaneurysm. This should also be in the differential  although no definitive venous flow is detected on this single arterial  phase. Dedicated Doppler ultrasound is recommended.  3.  Status post reconstruction of the ascending thoracic aorta with  aneurysmal dilatation of the aortic root. Focal dissection within the  aortic root at the level of the noncoronary cusp is stable.    The urgent findings were discussed with Rinaldo Ratel, NP at 1320 hours on  12/13/2021.    Launa Flight, MD   12/13/2021 1:22 PM             Alwyn Ren, PA-C  Interventional Radiology  [facility]  Spectralink/Inpatient consults 7:30am-5pm Monday-Friday: [phone]   After hours office ph: [phone], fax: [phone]

## 2021-12-14 NOTE — Nursing Progress Note (Signed)
Date/Time:  12/14/21 6:18 PM   Patient Name:  Jessica [name]    Age: 86 y.o.   Room:  FI264/FI264-01     Cardiac Surgery RN Shift   Shift Events:   - bowel movement    Neuro  Ambulate with          [x]  assist       []  independently  PT/OT Dispo recs        Pain Controlled        [x]  Yes           []  No           Cardiac  BP Controlled [x]  Yes          []  No          PRN meds given []  Yes            [x]  No      Rhythm                    Sinus rhythm      Arrhythmias         []  Yes            [x]  No      Pacing Wires           []  Yes           [x]  No                           Pulmonary                     O2 NC                      [x]  Yes           []  No    2L                Chest tubes              []  Yes           [x]  No                                                                        G.I.   Tolerating Diet          [x]  Yes           []  No      BM last 24 hrs          [x]  Yes          []  No               Last BM Date:  (pta)    PPI ppx                    [x]  Yes          []  No      Renal                Foley present            []  Yes          [  x] No                                            Heme  DVT Prophylaxis            [x]  Yes           []  No                    Anticoagulation        []  Yes           [x]  No       ID   Febrile                     []  Yes          [x]  No        Endocrine   Off insulin drip         [x]  Yes           []  No      BS controlled           [x]  Yes           []  No      _____________________________________________________________________________________         Barriers to discharge:       Anticipated discharge date:     Priority discharge:  []  Yes            []  No

## 2021-12-14 NOTE — Consults (Signed)
Nutrition Consult  Reason for Consult: poor po intake    Recommendations:  Regular diet will adjust texture to mech soft  Will continue with Nepro TID  Rec continue with appetite stimulant  MVI 2* malnutrition    If continues with poor po intake and weight loss  Rec consider enteral nutrition if consistent with GOC  Starting slowly to avoid refeeding syndrome  Corpak TF Osmolite 1.5 at 74ml/hour goal of 30ml/hour ( 1800 kcals, 77g protein)  30ml H20 q 4 hours minimum  100mg  thiamine daily    Floor RD to follow    Clinical update: Jessica [name] is a(n) 86 y.o. female with PMHx HTN,CKDCAD,TIA admitted with L flank pain    Patient  and family reports 60# weight loss x 1 year months with poor appetite  Visited patient at bedside along with her daughter who is a physician  She likes Nepro supplement  Previously needed pureed diet 2* no molars however prefer to keep a regular diet, will adjust to mechanical soft  Patient and family were discussing enteral tube feeding,patient was in agreement      Anthropometrics:  Ht Readings from Last 1 Encounters:   09/12/21 1.626 m (5\' 4" )     Wt Readings from Last 1 Encounters:   12/14/21 54 kg (119 lb)       % weight change:per patient report 30% weight loss x 61 year   Weight Monitoring Height Height Method Weight Weight Method BMI (calculated)   08/07/2021   57.2 kg Bed Scale    08/10/2021   51.03 kg Standing Scale    09/12/2021 162.6 cm  44.725 kg  17 kg/m2   09/12/2021 162.6 cm  44.725 kg  17 kg/m2   12/14/2021   53.978 kg Bed Scale        Anthropometrics  Weight: 54 kg (119 lb)  Body mass index is 20.43 kg/m.      Recent Labs:   Recent Labs   Lab 12/14/21  0459   Sodium 141   Potassium 4.0   Chloride 107   CO2 25   BUN 25.0*   Creatinine 1.2*   Calcium 9.2   Albumin 1.7*   Protein, Total 7.8   Bilirubin, Total 0.7   Alkaline Phosphatase 78   ALT 19   AST (SGOT) 35   Glucose 84     Recent Medications:allopurinol,atorvastatin,dronabinol,famotidine,Iron,levothyroxine,senna,vitamin  C,vitamin D  IV:    lactated ringers 50 mL/hr at 12/13/21 0906       Nutrition Focused Physical Exam (NFPE)  Visual and discussed with daughter who is a physican  Head: temple region: slight depression with decrease in muscle tone/resistance (moderate muscle loss - temporalis), orbital region: hollow look, depressions, dark circles, loose skin, significant decrease in bounce back of fat pads (severe fat loss), and buccal region: slight depression, somewhat sunken appearance, flat cheeks, decrease in bounce back of fat pads (moderate fat loss)  Upper Body: clavicle bone region: some protrusion of the clavicle with decrease in muscle tone/resistance (moderate muscle loss - pectoralis major) and dorsal hand region: deep depression, minimal to no muscle tone/resistance (severe muscle loss - interosseous)  Lower Body: patient's daughter reports severe wasting in lower extremities    Nutrition Diagnosis  Severe malnutrition related to inadequate protein energy intake in the setting of chronic illness as evidenced by intake < 75% of estimated energy requirements for >1 month,  >20% weight loss x 1 year,  severe muscle depletion temporalis, pectoralis major/trapezius, deltoids, supra-/infraspinatus, interosseous, quadriceps,  gastrocnemius),  severe subcutaneous fat loss  orbital fat pads, buccal fat pads.       Current Diet Order    Orders Placed This Encounter   Procedures    Diet regular    Nepro Shake Quantity: A. One; Frequency: TID (3 times a day)     No Known Allergies    Estimated Energy Needs:  Calorie:1620-1890 kcals/day ( 30-35 kcals/kg/cBW)  Protein:65-76g protein/day ( 1.201.4g protein/kg/CBW)  Fluid:19ml/kg/fluids    Monitor/Eval:  PO intake, Labs, GI distress, BMs, Weight trends     Aundra Millet, [place], [place]  #[id number]  Cell [phone]

## 2021-12-14 NOTE — Progress Notes (Signed)
Cardiac Surgery Progress Note  Date/Time:  12/14/21 12:21 PM   Patient Name:  Jessica [name] Age: 86 y.o.   Room:  FI264/FI264-01     Operative Profile:   HD#: [id number]    12/30:  Transfer from [facility] for evaluation of L flank pain in setting of recent TEVAR    08/06/21: TEVAR [MDT Val Cap 42 x 42 x 150 mm; 42 x 42 x 100 mm;  42 x 38 x 150 mm; 38 x 38 x 100  mm endografts].  USG guided access and device closure of the R CFA [> 12 Fr] [16109] with an 18 Fr Manta device.   Drs. Ryan and Tecumseh    TTE 08/14/21  [facility])  LVEF 55-70%.  Moderate AR.  Mild AV sclerosis.  Moderately elevated RVSP .  Moderate pHTN.     Cardiologist: TBD    Synopsis   Indication for Surgery: chronic type 3B aortic dissection    Post-op Course: Patient's left flank pain has been waxing and waning since last August's procedure with recent escalation. Not worsened by eating. Patient reports 60 lb weight loss since the fall with anorexia of unclear etiology.    PMH:  has a past medical history of Atrophic kidney, CAD (coronary artery disease), Dissection of thoracic aorta, Humerus fracture (05/2021), Iron deficiency anemia, and TIA (transient ischemic attack) (07/01/2001).    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        - open or endovascular repair is prohibitive given her age and current overall condition        - s/p LCFA thrombin injection by IR, for 5.1cm pseudoaneurysm, repeat US pending.   -  Palliative team consulted and pain regimen escalated  -  PT/ OT to eval any new needs for d/c  home with palliative  - Nutrition consult for severe protein calorie malnutrition    Dispo: likely d/c to home (with/ without HHS) in next 1-2 days pending pain relief and improved mobility.               System Assessment of Active Issues       Neurological?:  Pain management: Pain is not under control, facility addressed issue by adding scheduled tylenol and lido patch, and PRN oxy    On home dose of allopurinol  Hx of TIA    Cardiovascular:  Type A dissection, s/p open repair (2021)  Chronic Type B dissection, s/p TEVAR (07/2021)  Type III and Ib endoleaks, medical mgmt:   HR and BP control on bisoprolol and digoxin  CAD, on ASA/ statin/ BB    Pulmonary:  Small bilateral pleural effusions and compressive atelectasis.  Will resume diuresis as BP allows     Gastrointestinal:  Anorexia, acute on chronic e/b 60 lb weight loss in last year.  Nutrition consult, Nepro TID ordered     Renal/GU:  IV hydration with LR pre/ post contrasted study  Stage 3b chronic kidney disease with a baseline creatinine of 1.0-1.4: remaining stable  Cr now 1.2  Infectious Disease:  Clinically, no acute issue    Hematological:  Chronic anemia, on iron supplement chronically  Hct 23.3-> 27.5    Endocrinological:  Continue daily levothyroxine for hypothyroid;  No hx of DM  HgbA1C:   Lab Results   Component Value Date    HGBA1C 5.0 07/31/2021       Subjective:   Pt reports Left flank pain and mid-back pain prevents her from mobilizing    Physical Exam:   BP 127/64   Pulse 83   Temp 97.9 F (36.6 C) (Oral)   Resp 18   Wt 54 kg (119 lb)   SpO2 (!) 71%   BMI 20.43 kg/m     Physical Exam  Vitals reviewed.   Constitutional:       Appearance: She is ill-appearing.      Comments: Cachectic appearing   HENT:      Head: Normocephalic.   Eyes:      Extraocular Movements: Extraocular movements intact.      Pupils: Pupils are equal, round, and reactive to light.   Cardiovascular:      Rate and Rhythm: Normal rate and regular rhythm.      Heart  sounds: Normal heart sounds.   Pulmonary:      Effort: Pulmonary effort is normal.      Comments: Diminished at bases  Abdominal:      General: Abdomen is flat. Bowel sounds are normal.      Palpations: Abdomen is soft.   Musculoskeletal:         General: Normal range of motion.   Skin:     General: Skin is warm and dry.   Neurological:      General: No focal deficit present.      Mental Status: She is alert. Mental status is at baseline.       CBC:  Recent Labs     12/14/21  0654   WBC 11.38*   Hgb 7.9*   Hematocrit 27.5*   Platelets 278     BMP:  Recent Labs     12/14/21  0459   Sodium 141   Potassium 4.0   Chloride 107   CO2 25   BUN 25.0*   Creatinine 1.2*   Glucose 84   Magnesium 2.3   Calcium 9.2                Signed by: Marton Redwood, NP

## 2021-12-14 NOTE — UM Notes (Addendum)
12/13/21 0741  ADMIT TO INPATIENT  Once        Diagnosis: Flank Pain    Level of Care: Step Down/ Prog Care [facility], [facility], [facility] ONLY    Patient Class: Inpatient       References:    [facility] Bed Placement Criteria    [facility] Bed Placement Criteria    [facility] Bed Placement Criteria    [facility] Bed Placement Criteria    [facility] Bed Placement Criteria   Question Answer Comment   Admitting Physician SARIN, ERIC L    Estimated Length of Stay > or = to 2 midnights    Tentative Discharge Plan? Home or Self Care    Does patient need telemetry? No              Number of reviews in this clinical:  2 days, admit to inpt 12/30 and csr 12/31.    12/30-12/31 [facility]UMANA. CVSD. Flank Pain, P/w Endoleaks of previous Dissection repairs, S/p IR and PSEUDOANEURYSM REPAIR12/30/22 529pm  TAAA with associated dissection.   S/p IR and PSEUDOANEURYSM REPAIR12/30/22 529pm.  Per Vasc Surgery, there is likely a Type III and IB endoleak, surgery prohibitive  fue to age and condition. Plan pain control, w/u L flank pain, IR consult thrombin injection of PSA,.  CTA with endoleaks at multiple parts of previous stent grafts.    MCG:   MET  -  FOR INPATIENT ADMISSION:  Criteria Set Name - Subset   Vascular Disease GRG - Clinical Indications for Admission to Inpatient Care  (X) Hospital admission is needed for appropriate care of the patient because of  1 or more  of    the following  (1) (2) (3) (4) (5):       (X) Acute or newly diagnosed major vessel (eg, aorta) dissection, rupture, fistula, or leakage         86 y.o. female pmhx of atrophic kidney, CKD, CAD, TIA, expanding aortic aneurysm and chronic dissection s/p TEVAR with Dr. Alycia Rossetti on 08/06/2021 who presents to outside ED with Flank pain.  Pt underwent CTA that showed e/o endoleaks at multiple parts of stent grafts.           Payor: MEDICARE [employer] / Plan: [employer] MEDICARE PPO / Product Type: MANAGED MEDICARE /     LOC:  [facility]    [facility]  Name and DOB: Jessica [name],Jessica [name] [birth year]  DX:   The primary  encounter diagnosis was Pseudoaneurysm. Diagnoses of Flank pain and Pseudoaneurysm were also pertinent to this visit.  Flank pain [R10.9]      ADMIT DATE/TIME TO HOSPITAL:12/13/2021  7:38 AM  PMH:  has a past medical history of Atrophic kidney, CAD (coronary artery disease), Dissection of thoracic aorta, Humerus fracture (05/2021), Iron deficiency anemia, and TIA (transient ischemic attack) (07/01/2001).  PSH:   has a past surgical history that includes Hysterectomy; s/p hemiarch repair; APPENDECTOMY (OPEN); Mouth surgery; Glaucoma surgery (Bilateral); and Thoracic Aortic Stent Graft (TAA) (N/A, 08/06/2021).          Enc Vitals Group      BP 12/13/21 0730 119/73      Heart Rate 12/13/21 0730 97      Resp Rate 12/13/21 0730 18      Temp 12/13/21 0730 97.9 F (36.6 C)      Temp Source 12/13/21 0730 Oral      SpO2 12/13/21 0730 97 %      Weight 12/14/21 0449 54 kg (119 lb)  MEDS:  CONTINUOUS IV MEDS DRIPS GTTS:   lactated ringers 50 mL/hr at 12/13/21 0906     ADMIT TO INPATIENT.          Ct chest abd pelvis Result Date: 12/13/2021   1. Status post thoracic endovascular stent graft placement for a complex Type 3B aortic dissection with aneurysm with multiple endoleaks including 3 type III endoleaks and a distal type IB endoleak as further detailed above. Significant interval increase in size of the excluded aneurysm. 2. Left groin hematoma overlying the left common femoral artery which demonstrates a focal dissection and a linear blush of contrast which is suspicious for pseudoaneurysm. This should also be in the differential although no definitive venous flow is detected on this single arterial phase. Dedicated Doppler ultrasound is recommended. 3.  Status post reconstruction of the ascending thoracic aorta with aneurysmal dilatation of the aortic root. Focal dissection within the aortic root at the level of the noncoronary cusp is stable. The urgent findings were discussed with Rinaldo Ratel, NP at 1320 hours on  12/13/2021. Launa Flight, MD  12/13/2021 1:22 PM      Interventional Radiology  Case Time:  Procedures:  Surgeons:    12/13/2021  4:48 PM PSEUDOANEURYSM REPAIR    Precious Gilding, MD          Date Time: 12/13/21 4:38 PM   PROCEDURALIST COMMENTS BELOW:   86 yo F with history of type A aortic dissection s/p TEVAR on 08/06/2021 presents with left flank pain found to have left CFA pseudoaneurysm who presents for U/S guided thrombin injection.     INDICATIONS:   Procedure(s):  PSEUDOANEURYSM REPAIR     Preoperative Diagnosis:   Pre-Op Diagnosis Codes:     * Pseudoaneurysm [I72.9]  Findings:   SUCCESSFUL THROMBIN INJECTION OF A LEFT CFA 5.1CM PSEUDOANEURYSM   800 units thrombin injected.   CFA, PFA, AND proximal SFA remain patent       --------------------------------.  VASCULAR SURGERY 455pm:  86 y.o. female with PMHx of HTN, CKD, CAD, and TIA who presents from ER after transfer from [facility] where she presented with L flank pain. Patient is s/p TEVAR on 08/06/21 with Drs. Sullivan Lone, and [facility]. She has a surgical history of open type A repair in 2021 in [place]. Per patient and niece who is bedside, patient's left flank pain has been waxing and waning since last August's procedure with recent escalation. Not worsened by eating. Patient reports 60 lb weight loss since the fall with anorexia of unclear etiology. No f/c. Known atopic R kidney.     Patient has not had imaging follow up since procedure in August. CTA post hydration concerning for Type III and Type IB endoleak with aneurysm sac growth. Also noted to have L CFA pseudoaneurysm    Assessment:   86 y.o. female who presents with enlarging TAAA with associated dissection. Upon my review of the CTA, there is likely a Type III and IB endoleak. Surgical exclusion of the aneurysm via open or endovascular means is prohibitive at her age in current overall condition. With regards to patient's pain, it certainly anatomically could be from the  aneurysm however the longstanding duration would suggest it is not related.    PLAN:  Pain control  Medical workup for L flank pain  Consider geriatrics consult  IR consult for thrombin injection of PSA s/p procedure 12/13/21  530pm  Plan discussed bedside with patient and niece. All questions answered.    ============================================================.  Continued Stay Review: 12/14/21.    86 y.o. F w/ hx of Type A and Type B aortic dissection s/p open repair of Type A in 2021 and s/p TEVAR on 08/06/21 who p/w left flank pain found to have multiple endoleaks that have increased in size, as well as, a left CFA PSA. She is now s/p thrombin injection of left CFA PSA on 12/13/21.     - AVSS  - H/H: 7.9/27.5  - WBC: 11.38  - Cr: 1.2     Plan:   - Continue to monitor left groin site  - Continue neurovascular checks  - Post procedure left groin PSA [place] either today or tomorrow. Discussed w/ cardiac surgery team who is aware.   - Rest of care per primary team.      Subjective:   Reports back pain last night and flank pain.       VS: BP 127/64   Pulse 83   Temp 97.9 F (36.6 C) (Oral)   Resp 18   Wt 54 kg (119 lb)   SpO2 (!) 71%   BMI 20.43 kg/m :         MEDS:  Continuous IV Drips/gtts Meds:   lactated ringers 50 mL/hr at 12/13/21 0906     Selected Meds IV:  Current Facility-Administered Medications   Medication Dose Route Frequency    acetaminophen  500 mg Oral 4 times per day    allopurinol  300 mg Oral Daily    aspirin  81 mg Oral Daily    atorvastatin  40 mg Oral Daily    bisoprolol  5 mg Oral Daily    digoxin  0.0625 mg Oral Daily    dronabinol  2.5 mg Oral BID Meals    famotidine  20 mg Oral Daily    ferrous sulfate  324 mg Oral QAM W/BREAKFAST    levothyroxine  25 mcg Oral Daily at 0600    senna-docusate  1 tablet Oral BID    vitamin C  250 mg Oral Daily    vitamin D  25 mcg Oral Daily       If this transmission is incomplete or not received in good order, please advise this sender  Nedra Hai RN) at   [phone] . Thank you.    Rads: [place] Groin Pseudoaneurysm Left W Dopp    Result Date: 12/13/2021   5.1 cm pseudoaneurysm arising from the left common femoral artery. [name], MD  12/13/2021 3:33 PM    CT Angiogram Chest Abdomen Pelvis W WO Contrast    Result Date: 12/13/2021   1. Status post thoracic endovascular stent graft placement for a complex Type 3B aortic dissection with aneurysm with multiple endoleaks including 3 type III endoleaks and a distal type IB endoleak as further detailed above. Significant interval increase in size of the excluded aneurysm. 2. Left groin hematoma overlying the left common femoral artery which demonstrates a focal dissection and a linear blush of contrast which is suspicious for pseudoaneurysm. This should also be in the differential although no definitive venous flow is detected on this single arterial phase. Dedicated Doppler ultrasound is recommended. 3.  Status post reconstruction of the ascending thoracic aorta with aneurysmal dilatation of the aortic root. Focal dissection within the aortic root at the level of the noncoronary cusp is stable. The urgent findings were discussed with Rinaldo Ratel, NP at 1320 hours on 12/13/2021. Launa Flight, MD  12/13/2021 1:22 PM      Recent Labs  12/14/21  0459 12/13/21  0803   BUN 25.0* 32.0*   Creatinine 1.2* 1.2*   EGFR 42.7 42.7   Sodium 141 142   Potassium 4.0 3.9   Magnesium 2.3  --    Calcium 9.2 8.9   Chloride 107 107   Glucose 84 81   CO2 25 24     Recent Labs     12/14/21  0654 12/14/21  0459 12/13/21  0803   WBC 11.38* 12.21*  --    Hgb 7.9* 6.6*  --    Hematocrit 27.5* 23.3*  --    Platelets 278 280  --    PT INR  --  1.1  --    AST (SGOT)  --  35 52*   ALT  --  19 27   Alkaline Phosphatase  --  78 77       UTILIZATION REVIEW CONTACT: Name: Patty Sermons RN BSN  Clinical Case Manager  - Utilization Review  [facility]  [address] [place], [place]  [address]  NPI:   [id number]  Tax ID:  [id number]  Phone:   [phone]   Fax: [phone]  Main UR Department Phone: [phone]     Please use fax number [phone] to provide authorization for hospital services or to request additional information.         NOTES TO REVIEWER:     This clinical review is based on/compiled from documentation provided by the treatment team within the patient's medical record.

## 2021-12-14 NOTE — Malnutrition Assessment (Signed)
Jessica [name] is a 86 y.o. female patient.   [id number]    Malnutrition Assessment   Malnutrition Documentation    Severe malnutrition related to inadequate protein energy intake in the setting of chronic illness as evidenced by intake < 75% of estimated energy requirements for >1 month,  >20% weight loss x 1 year,  severe muscle depletion temporalis, pectoralis major/trapezius, deltoids, supra-/infraspinatus, interosseous, quadriceps, gastrocnemius),  severe subcutaneous fat loss  orbital fat pads, buccal fat pads.          Aundra Millet, RD      If physician disagrees with this assessment see addendum.

## 2021-12-14 NOTE — Plan of Care (Signed)
Date/Time:  12/14/21 10:24 PM   Patient Name:  Jessica [name]    Age: 86 y.o.   Room:  FI264/FI264-01     Cardiac Surgery RN Shift   Shift Events:   - None    Neuro  Ambulate with          [x]  assist       []  independently  PT/OT Dispo recs    Home  Pain Controlled        [x]  Yes           []  No           Cardiac  BP Controlled [x]  Yes          []  No            PRN meds given []  Yes            [x]  No      Rhythm                    Sinus rhythm        Arrhythmias         []  Yes            [x]  No      Pacing Wires           []  Yes           [x]  No                           Pulmonary                     O2 NC                      [x]  Yes           []  No                    Chest tubes              []  Yes           [x]  No                                                                            IS                                 G.I.   Tolerating Diet          [x]  Yes           []  No      BM last 24 hrs          []  Yes          [x]  No               Last BM Date:  (PTA)    PPI ppx                    [x]  Yes          []  No  Renal                Foley present            []  Yes          [x]  No                                            Heme  DVT Prophylaxis            [x]  Yes           []  No                    Anticoagulation        []  Yes           [x]  No       ID   Febrile                     []  Yes          [x]  No        Endocrine   Off insulin drip         [x]  Yes           []  No      BS controlled           [x]  Yes           []  No      _____________________________________________________________________________________         Barriers to discharge:   Pain management   Ambulation  Anticipated discharge date:   12/15/21    Priority discharge:  []  Yes            [x]  No         Problem: Safety  Goal: Patient will be free from injury during hospitalization  Outcome: Progressing  Goal: Patient will be free from infection during hospitalization  Outcome: Progressing     Problem: Pain  Goal: Pain at adequate level as  identified by patient  Outcome: Progressing     Problem: Side Effects from Pain Analgesia  Goal: Patient will experience minimal side effects of analgesic therapy  Outcome: Progressing     Problem: Discharge Barriers  Goal: Patient will be discharged home or other facility with appropriate resources  Outcome: Progressing     Problem: Psychosocial and Spiritual Needs  Goal: Demonstrates ability to cope with hospitalization/illness  Outcome: Progressing     Problem: Moderate/High Fall Risk Score >5  Goal: Patient will remain free of falls  Outcome: Progressing     Problem: Compromised Tissue integrity  Goal: Damaged tissue is healing and protected  Outcome: Progressing  Goal: Nutritional status is improving  Outcome: Progressing

## 2021-12-15 DIAGNOSIS — I729 Aneurysm of unspecified site: Secondary | ICD-10-CM

## 2021-12-15 DIAGNOSIS — Z7189 Other specified counseling: Secondary | ICD-10-CM

## 2021-12-15 DIAGNOSIS — R109 Unspecified abdominal pain: Secondary | ICD-10-CM

## 2021-12-15 LAB — GFR: EGFR: 42.7

## 2021-12-15 LAB — CBC AND DIFFERENTIAL
Absolute NRBC: 0.12 10*3/uL — ABNORMAL HIGH (ref 0.00–0.00)
Basophils Absolute Automated: 0.01 10*3/uL (ref 0.00–0.08)
Basophils Automated: 0.1 %
Eosinophils Absolute Automated: 0.07 10*3/uL (ref 0.00–0.44)
Eosinophils Automated: 0.7 %
Hematocrit: 22.8 % — ABNORMAL LOW (ref 34.7–43.7)
Hgb: 6.5 g/dL — ABNORMAL LOW (ref 11.4–14.8)
Immature Granulocytes Absolute: 0.09 10*3/uL — ABNORMAL HIGH (ref 0.00–0.07)
Immature Granulocytes: 0.9 %
Lymphocytes Absolute Automated: 1.24 10*3/uL (ref 0.42–3.22)
Lymphocytes Automated: 12 %
MCH: 19.3 pg — ABNORMAL LOW (ref 25.1–33.5)
MCHC: 28.5 g/dL — ABNORMAL LOW (ref 31.5–35.8)
MCV: 67.9 fL — ABNORMAL LOW (ref 78.0–96.0)
MPV: 10.7 fL (ref 8.9–12.5)
Monocytes Absolute Automated: 0.62 10*3/uL (ref 0.21–0.85)
Monocytes: 6 %
Neutrophils Absolute: 8.3 10*3/uL — ABNORMAL HIGH (ref 1.10–6.33)
Neutrophils: 80.3 %
Nucleated RBC: 1.2 /100 WBC — ABNORMAL HIGH (ref 0.0–0.0)
Platelets: 283 10*3/uL (ref 142–346)
RBC: 3.36 10*6/uL — ABNORMAL LOW (ref 3.90–5.10)
RDW: 21 % — ABNORMAL HIGH (ref 11–15)
WBC: 10.33 10*3/uL — ABNORMAL HIGH (ref 3.10–9.50)

## 2021-12-15 LAB — BASIC METABOLIC PANEL
Anion Gap: 8 (ref 5.0–15.0)
BUN: 25 mg/dL — ABNORMAL HIGH (ref 7.0–21.0)
CO2: 26 mEq/L (ref 17–29)
Calcium: 8.7 mg/dL (ref 7.9–10.2)
Chloride: 108 mEq/L (ref 99–111)
Creatinine: 1.2 mg/dL — ABNORMAL HIGH (ref 0.4–1.0)
Glucose: 78 mg/dL (ref 70–100)
Potassium: 4.1 mEq/L (ref 3.5–5.3)
Sodium: 142 mEq/L (ref 135–145)

## 2021-12-15 LAB — PT AND APTT
PT INR: 1.1 (ref 0.9–1.1)
PT: 12.9 s (ref 10.1–12.9)
PTT: 26 s — ABNORMAL LOW (ref 27–39)

## 2021-12-15 LAB — HEMOGLOBIN AND HEMATOCRIT, BLOOD
Hematocrit: 24.4 % — ABNORMAL LOW (ref 34.7–43.7)
Hgb: 7 g/dL — ABNORMAL LOW (ref 11.4–14.8)

## 2021-12-15 LAB — MAGNESIUM: Magnesium: 2.4 mg/dL (ref 1.6–2.6)

## 2021-12-15 MED ORDER — OXYCODONE HCL 5 MG PO TABS
5.0000 mg | ORAL_TABLET | ORAL | Status: DC | PRN
Start: 2021-12-15 — End: 2021-12-18
  Administered 2021-12-16 – 2021-12-18 (×9): 5 mg via ORAL
  Filled 2021-12-15 (×9): qty 1

## 2021-12-15 NOTE — OT Eval Note (Signed)
Occupational Therapy Eval Jessica [name]        Post Acute Care Therapy Recommendations:     Discharge Recommendations:  SNF    If SNF  recommended discharge disposition is not available, patient will need hands on assist for ADLs, household mobility, rolling walker, bedside commode and HHOT.     DME needs IF patient is discharging home:  (TBD @ Rehab)    Therapy discharge recommendations may change with patient status.  Please refer to most recent note for up-to-date recommendations.    Assessment:   Significant Findings: none    Jessica [name] is a 87 y.o. female admitted 12/13/2021.  Patient presents with hx of recent TEVAR 8/23, L flank pain, pseudoaneurysm repair on 12/30. Pt engages in OT Evaluation, daughter present. Pt performs ADLs and household mobility with minA. Session limited by pt's L flank pain. Pt is experiencing decreased strength, balance, endurance, and overall activity tolerance which is impacting independence and safety with ADLs and functional mobility. Pt is performing below functional baseline and will benefit from continued OT to maximize independence and safety with ADLs and functional transfers/mobility.     Impairments: Assessment: decreased strength;decreased independence with ADLs;balance deficits;decreased safety awareness;decreased endurance/activity tolerance;decreased independence with IADLs    Therapy Diagnosis: decreased ADL independence     Rehabilitation Potential: Prognosis: Good;With continued OT s/p acute discharge     Treatment Activities: OT Evaluation, ADL retraining   Educated the patient to role of occupational therapy, plan of care, goals of therapy and HEP, safety with mobility and ADLs, energy conservation techniques, discharge instructions, home safety.    Plan:   OT Frequency Recommended: 2-3x/wk     Treatment Interventions: ADL retraining;Functional transfer training;Endurance training;Equipment eval/education;Patient/Family training;Compensatory technique education;UE  strengthening/ROM     Risks/benefits/POC discussed with patient/daughter      [facility]  Bed: FI264/FI264-01         Precautions and Contraindications:   Precautions  Other Precautions: Moderate Fall Risk      Consult received for Jessica [name] for OT Evaluation and Treatment.  Patient's medical condition is appropriate for Occupational Therapy intervention at this time.    Admitting Diagnosis: Flank pain [R10.9]      History of Present Illness:    Jessica [name] is a 87 y.o. female admitted on 12/13/2021 with "pmhx of atrophic kidney, CKD, CAD, TIA, expanding aortic aneurysm and chronic dissection s/p TEVAR with Dr. Alycia Rossetti on 08/06/2021 who presents to outside ED with Flank pain.  Pt underwent CTA that showed e/o endoleaks at multiple parts of stent grafts.  " per chart      Past Medical/Surgical History:  Past Medical History:   Diagnosis Date    Atrophic kidney     Right    CAD (coronary artery disease)     Dissection of thoracic aorta     Type A dissection    Humerus fracture 05/2021    L Arm in sling s/p fall per dtr    Iron deficiency anemia     TIA (transient ischemic attack) 07/01/2001     Past Surgical History:   Procedure Laterality Date    APPENDECTOMY (OPEN)      GLAUCOMA SURGERY Bilateral     HYSTERECTOMY      MOUTH SURGERY      tonsillectomy    s/p hemiarch repair      THORACIC AORTIC STENT GRAFT (TAA) N/A 08/06/2021    Procedure: TEVAR;  Surgeon: Halford Decamp, MD;  Location:  [facility];  Service: Cardiovascular;  Laterality: N/A;           Imaging/Tests/Labs:  [place] Groin Pseudoaneurysm Left W Dopp    Result Date: 12/15/2021  Thrombosed left common femoral artery pseudoaneurysm. 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      Subjective: "I hurt so bad"     Patient is agreeable to participation in the therapy session. Nursing clears patient for therapy.     Patient goal: to get better       Pain Assessment  Pain Assessment: Numeric Scale (0-10)  Pain Score: 10-severe pain  Pain Location:  (flank)  Pain Orientation: Left  Pain Intervention(s): Repositioned (RN present)      Objective:        Observation of Patient/Vital Signs:  Patient is seated in a bedside chair with dressings, telemetry, and O2 via nasal cannula in place.  Pt wore mask during therapy session:No      Cognitive Status and Neuro Exam:  Cognition/Neuro Status  Orientation Level: Oriented X4  Behavior: impulsive    Neuro Status  Behavior: impulsive         Musculoskeletal Examination  Gross ROM  Gross ROM: within functional limits    Gross Strength  Right Upper Extremity Strength: 3+/5  Left Upper Extremity Strength: 3+/5              Sensory/Oculomotor Examination  Sensory  Auditory: intact  Tactile - Light Touch: intact  Visual Acuity: intact         Activities of Daily Living  Self-care and Home Management  Eating: Independent  Grooming: Contact Guard Assist;edge of bed  Bathing: Minimal Assist  UB Dressing: Minimal Assist  LB Dressing: Moderate Assist  Toileting: Minimal Assist  Functional Transfers: Minimal Assist    Functional Mobility:  Mobility and Transfers  Supine to Sit: Moderate Assist  Sit to Stand: Minimal Assist  Functional Mobility/Ambulation: Minimal Assist     PMP Activity: Step 6 - Walks in  Room     Balance  Balance  Static Sitting Balance: good  Dyanamic Sitting Balance: fair  Static Standing Balance: fair  Dynamic Standing Balance: fair    Participation and Activity Tolerance  Participation and Endurance  Participation Effort: excellent  Endurance: Tolerates 10 - 20 min exercise with multiple rests    Patient left with call bell within reach, all needs met, SCDs off as found, fall mat on, bed alarm off, chair alarm on and all questions answered. RN notified of session outcome and patient response.       Goals:  Time For Goal Achievement: 5 visits  ADL Goals  Patient will groom self: Supervision  Patient will dress upper body: Supervision  Patient will dress lower body: Supervision  Patient  will toilet: Supervision  Mobility and Transfer Goals  Pt will transfer bed to toilet: Supervision                           PPE worn during session: procedural mask and gloves      Time of treatment:   OT Received On: 12/15/21  Start Time: 1115  Stop Time: 1145  Time Calculation (min): 30 min          Randa Lynn, OTR/L  Pager [phone]

## 2021-12-15 NOTE — Plan of Care (Signed)
Date/Time:  12/15/21 11:50 PM   Patient Name:  Jessica [name]    Age: 87 y.o.   Room:  FI264/FI264-01     Cardiac Surgery RN Shift   Shift Events:   - None    Neuro  Ambulate with          [x]  assist       []  independently  PT/OT Dispo recs    Home  Pain Controlled        [x]  Yes           []  No           Cardiac  BP Controlled [x]  Yes          []  No            PRN meds given []  Yes            [x]  No      Rhythm                    Sinus rhythm        Arrhythmias         []  Yes            [x]  No      Pacing Wires           []  Yes           [x]  No                           Pulmonary                     O2 NC                      [x]  Yes           []  No                    Chest tubes              []  Yes           [x]  No                                                                            IS                                 G.I.   Tolerating Diet          [x]  Yes           []  No      BM last 24 hrs          [x]  Yes          []  No               Last BM Date: 12/15/21    PPI ppx                    [x]  Yes          []  No  Renal                Foley present            []  Yes          [x]  No                                            Heme  DVT Prophylaxis            [x]  Yes           []  No                    Anticoagulation        []  Yes           [x]  No       ID   Febrile                     []  Yes          [x]  No        Endocrine   Off insulin drip         [x]  Yes           []  No      BS controlled           [x]  Yes           []  No      _____________________________________________________________________________________         Barriers to discharge:   Pain management   Hospice consult   Anticipated discharge date:   TBD    Priority discharge:  []  Yes            [x]  No         Problem: Safety  Goal: Patient will be free from injury during hospitalization  Outcome: Progressing  Goal: Patient will be free from infection during hospitalization  Outcome: Progressing     Problem: Pain  Goal: Pain at adequate level  as identified by patient  Outcome: Progressing     Problem: Side Effects from Pain Analgesia  Goal: Patient will experience minimal side effects of analgesic therapy  Outcome: Progressing     Problem: Discharge Barriers  Goal: Patient will be discharged home or other facility with appropriate resources  Outcome: Progressing     Problem: Psychosocial and Spiritual Needs  Goal: Demonstrates ability to cope with hospitalization/illness  Outcome: Progressing     Problem: Moderate/High Fall Risk Score >5  Goal: Patient will remain free of falls  Outcome: Progressing     Problem: Compromised Tissue integrity  Goal: Damaged tissue is healing and protected  Outcome: Progressing  Goal: Nutritional status is improving  Outcome: Progressing

## 2021-12-15 NOTE — Plan of Care (Signed)
Problem: Safety  Goal: Patient will be free from injury during hospitalization  Outcome: Progressing  Flowsheets (Taken 12/15/2021 1807)  Patient will be free from injury during hospitalization:   Assess patient's risk for falls and implement fall prevention plan of care per policy   Provide and maintain safe environment   Use appropriate transfer methods   Ensure appropriate safety devices are available at the bedside   Hourly rounding  Goal: Patient will be free from infection during hospitalization  Outcome: Progressing  Flowsheets (Taken 12/15/2021 1807)  Free from Infection during hospitalization:   Assess and monitor for signs and symptoms of infection   Monitor lab/diagnostic results     Problem: Pain  Goal: Pain at adequate level as identified by patient  Outcome: Progressing  Flowsheets (Taken 12/15/2021 1807)  Pain at adequate level as identified by patient:   Identify patient comfort function goal   Assess for risk of opioid induced respiratory depression, including snoring/sleep apnea. Alert healthcare team of risk factors identified.   Assess pain on admission, during daily assessment and/or before any "as needed" intervention(s)   Reassess pain within 30-60 minutes of any procedure/intervention, per Pain Assessment, Intervention, Reassessment (AIR) Cycle     Problem: Side Effects from Pain Analgesia  Goal: Patient will experience minimal side effects of analgesic therapy  Outcome: Progressing  Flowsheets (Taken 12/15/2021 1807)  Patient will experience minimal side effects of analgesic therapy:   Monitor/assess patient's respiratory status (RR depth, effort, breath sounds)   Assess for changes in cognitive function     Problem: Discharge Barriers  Goal: Patient will be discharged home or other facility with appropriate resources  Outcome: Progressing  Flowsheets (Taken 12/15/2021 1807)  Discharge to home or other facility with appropriate resources: Provide appropriate patient education     Problem:  Compromised Tissue integrity  Goal: Damaged tissue is healing and protected  Outcome: Progressing  Flowsheets (Taken 12/15/2021 1807)  Damaged tissue is healing and protected:   Monitor/assess Braden scale every shift   Provide wound care per wound care algorithm   Reposition patient every 2 hours and as needed unless able to reposition self   Increase activity as tolerated/progressive mobility   Relieve pressure to bony prominences for patients at moderate and high risk

## 2021-12-15 NOTE — Progress Notes (Signed)
Cardiac Surgery Progress Note  Date/Time:  12/15/21 10:20 AM   Patient Name:  Jessica [name] Age: 87 y.o.   Room:  FI264/FI264-01     Operative Profile:   HD#: 3    12/30:  Transfer from [facility] for evaluation of L flank pain in setting of recent TEVAR    08/06/21: TEVAR [MDT Val Cap 42 x 42 x 150 mm; 42 x 42 x 100 mm;  42 x 38 x 150 mm; 38 x 38 x 100  mm endografts].  USG guided access and device closure of the R CFA [> 12 Fr] [16109] with an 18 Fr Manta device.   Drs. Ryan and [place]    TTE 08/14/21  [facility])  LVEF 55-70%.  Moderate AR.  Mild AV sclerosis.  Moderately elevated RVSP .  Moderate pHTN.     Cardiologist: TBD    Synopsis   Indication for Surgery: chronic type 3B aortic dissection    Post-op Course: Patient's left flank pain has been waxing and waning since last August's procedure with recent escalation. Not worsened by eating. Patient reports 60 lb weight loss since the fall with anorexia of unclear etiology.    PMH:  has a past medical history of Atrophic kidney, CAD (coronary artery disease), Dissection of thoracic aorta, Humerus fracture (05/2021), Iron deficiency anemia, and TIA (transient ischemic attack) (07/01/2001).    Daily Summary (see running discharge summary for prior events)  12/30: Admit for L flank pain, eval TEVAR graft  12/30: to IR for thrombin injection to LCFA    Plan:   Remains in the CVSD due to persistent flank pain and medical mgmt of Type III and IB endoleaks  -  Enlarging excluded aneurysm, type III and a distal type IB endoleak, and LCFA pseudoaneurysm;       non-operable. Palliative and hospice consulted, pt now a DNR and inpatient hospice consult ordered.  - hypotension, asymptomatic, BB held  - PT/ OT rec SNF, however pt and family prefer home with hospice  - Nutrition consult for severe protein calorie malnutrition    Dispo: d/c to home (with hospice) in next 1-2 days pending hospice acceptance.               System  Assessment of Active Issues       Neurological?:  Pain management: Pain is not under control, facility addressed issue by adding scheduled tylenol and lido patch, and PRN oxy    On home dose of allopurinol  Hx of TIA    Cardiovascular:  Type A dissection, s/p open repair (2021)  Chronic Type B dissection, s/p TEVAR (07/2021)  Type III and Ib endoleaks, medical mgmt:   HR and BP control on bisoprolol and digoxin  CAD, on ASA/ statin/ BB    Pulmonary:  Small bilateral pleural effusions and compressive atelectasis.  Will resume diuresis as BP allows     Gastrointestinal:  Anorexia, acute on chronic e/b 60 lb weight loss in last year.  Nutrition consult, Nepro TID ordered     Renal/GU:  IV hydration with LR pre/ post contrasted study  Stage 3b chronic kidney disease with a baseline creatinine of 1.0-1.4: remaining stable  Cr now 1.2     Infectious Disease:  Clinically, no acute issue    Hematological:  Chronic anemia, on iron supplement chronically  Hct 23.3-> 27.5    Endocrinological:  Continue daily levothyroxine for hypothyroid;  No hx of DM  HgbA1C:   Lab Results  Component Value Date    HGBA1C 5.0 07/31/2021       Subjective:   Left flank pain much better overnight and this am, acutely exacerbated when having BM.     Physical Exam:   BP 92/53   Pulse 88   Temp 97.9 F (36.6 C) (Oral)   Resp 20   Wt 47.4 kg (104 lb 8 oz)   SpO2 (!) 81%   BMI 17.94 kg/m     Physical Exam  Vitals reviewed.   Constitutional:       Appearance: She is ill-appearing.      Comments: Cachectic appearing   HENT:      Head: Normocephalic.   Eyes:      Extraocular Movements: Extraocular movements intact.      Pupils: Pupils are equal, round, and reactive to light.   Cardiovascular:      Rate and Rhythm: Normal rate and regular rhythm.      Heart sounds: Normal heart sounds.   Pulmonary:      Effort: Pulmonary effort is normal.      Comments: Diminished at bases  Abdominal:      General: Abdomen is flat. Bowel sounds are normal.       Palpations: Abdomen is soft.   Musculoskeletal:         General: Normal range of motion.   Skin:     General: Skin is warm and dry.   Neurological:      General: No focal deficit present.      Mental Status: She is alert. Mental status is at baseline.       CBC:  Recent Labs     12/15/21  0914 12/15/21  0451   WBC  --  10.33*   Hgb 7.0* 6.5*   Hematocrit 24.4* 22.8*   Platelets  --  283       BMP:  Recent Labs     12/15/21  0451   Sodium 142   Potassium 4.1   Chloride 108   CO2 26   BUN 25.0*   Creatinine 1.2*   Glucose 78   Magnesium 2.4   Calcium 8.7               Signed by: Marton Redwood, NP

## 2021-12-15 NOTE — PT Eval Note (Signed)
Physical Therapy Evaluation  Jessica [name]      Post Acute Care Therapy Recommendations:     Discharge Recommendations:  SNF    If SNF  recommended discharge disposition is not available, patient will need hand on assist (min to mod A) for functional mobility and HHPT.     DME needs IF patient is discharging home: Patient already has needed equipment    Therapy discharge recommendations may change with patient status.  Please refer to most recent note for up-to-date recommendations.    Assessment:   Significant Findings: None    Jessica [name] is a 87 y.o. female admitted 12/13/2021 from OSH with L flank pain in setting of recent TEVAR (08/06/21). Pta, pt was living with family in split level home and was ambulatory with cane. During today's PT evaluation, pt required increased assistance over baseline and was min to mod A for bed mobility, sit <> stand and a few steps of ambulation to bedside chair. Limited by L flank pain. Pt presents with decreased functional strength, balance, and activity tolerance, which limits pt's functional mobility. Pt would benefit from continued skilled PT to address these deficits to facilitate return to PLOF and decrease risk of falling.    Impairments: Assessment: Decreased LE strength;Decreased endurance/activity tolerance;Decreased functional mobility;Decreased balance;Gait impairment.     Therapy Diagnosis: Impaired functional mobility    Rehabilitation Potential: Prognosis: Good;With continued PT status post acute discharge    Treatment Activities: PT evaluation, transfers, gait, education    Educated the patient to role of physical therapy, plan of care, goals of therapy and safety with mobility and ADLs, home safety.    Plan:   Treatment/Interventions: Exercise, Gait training, Stair training, Neuromuscular re-education, Functional transfer training, LE strengthening/ROM, Patient/family training, Bed mobility, Continued evaluation, Endurance training     PT Frequency: 3-4x/wk    Risks/Benefits/POC Discussed with Pt/Family: With patient       [facility]  Bed: FI264/FI264-01     Precautions and Contraindications:   Other Precautions: Moderate Fall Risk    Consult received for Jessica [name] for PT Evaluation and Treatment.  Patient's medical condition is appropriate for Physical therapy intervention at this time.    Medical Diagnosis: Flank pain [R10.9]      History of Present Illness:   Jessica [name] is a 87 y.o. female admitted on 12/13/2021 from\ OSH with L flank pain in setting of recent TEVAR (08/06/21). Found to have L CFA PSA, s/p US guided thrombin injection.     Past Medical/Surgical History:  Past Surgical History:   Procedure Laterality Date    APPENDECTOMY (OPEN)      GLAUCOMA SURGERY Bilateral     HYSTERECTOMY      MOUTH SURGERY      tonsillectomy    s/p hemiarch repair      THORACIC AORTIC STENT GRAFT (TAA) N/A 08/06/2021    Procedure: TEVAR;  Surgeon: Halford Decamp, MD;  Location: [facility];  Service: Cardiovascular;  Laterality: N/A;     Past Medical History:   Diagnosis Date    Atrophic kidney     Right    CAD (coronary artery disease)     Dissection of thoracic aorta     Type A dissection    Humerus fracture 05/2021    L Arm in sling s/p fall per dtr    Iron deficiency anemia     TIA (transient ischemic attack) 07/01/2001     X-Rays/Tests/Labs:  [place] Groin Pseudoaneurysm  Left W Dopp    Result Date: 12/15/2021  Thrombosed left common femoral artery pseudoaneurysm. 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    Subjective:   Patient is agreeable to participation in the therapy session. Nursing clears patient for therapy.     Patient Goal: decrease pain    Pain Assessment  Pain Assessment: Numeric Scale (0-10)  Pain Score: 9-severe pain  Pain Location:  (Flank)  Pain Orientation: Left  Pain Intervention(s): Medication (See eMAR);Repositioned;Rest    Objective:   Observation of Patient/Vital Signs:  Patient is in bed with telemetry, O2 via NC, external urinary cath in place.  Pt wore mask during therapy session:[name]      Observation of Patient/Vital signs:  VSS throughout session. [name] c/o dizziness or lightheadedness.       Cognition/Neuro Status  Arousal/Alertness: Appropriate responses to stimuli  Attention Span: Appears intact  Memory: Appears intact  Following Commands: Follows one step commands with increased time;Follows one step commands with repetition  Safety Awareness: minimal verbal instruction  Behavior: cooperative    Musculoskeletal Examination:  Gross ROM  Right Upper Extremity ROM: within functional limits  Left Upper Extremity ROM: within functional limits  Right Lower Extremity ROM: within functional limits  Left Lower Extremity ROM: within functional limits    Gross Strength  Right Upper Extremity Strength: within functional limits  Left Upper Extremity Strength: within functional limits  Right Lower Extremity Strength: within functional limits  Left Lower Extremity Strength: within functional limits       Functional Mobility:  Rolling: Moderate Assist  Supine to Sit: Moderate Assist;Increased Time;Increased Effort;HOB raised  Sit to Supine: Moderate Assist  Sit to Stand: Minimal Assist;Increased Time;Increased Effort  Stand to  Sit: Minimal Assist  Transfers  Bed to Chair: Minimal Assist    Ambulation:  PMP - Progressive Mobility Protocol   PMP Activity: Step 6 - Walks in Room  Distance Walked (ft) (Step 6,7): 3 Feet   Ambulation: Minimal Assist  Pattern: shuffle;decreased cadence;decreased step length;Step to     Balance:  Sitting - Static: Good  Sitting - Dynamic: Good  Standing - Static: Fair  Standing - Dynamic: Fair       Participation and Activity Tolerance:  Participation Effort: excellent  Endurance: Tolerates 10 - 20 min exercise with multiple rests    Patient left with call bell within reach, all needs met, SCDs off as found, fall mat in place, bed alarm N/A, chair alarm on and all questions answered. RN notified of session outcome and patient response.     Goals:   Goals  Goal Formulation: With patient  Time for Goal Acheivement: 5 visits  Goals: Select goal  Pt Will Go Supine To Sit: with supervision  Pt Will Perform Sit To Supine: with  supervision  Pt Will Perform Sit to Stand: with supervision  Pt Will Ambulate: 101-150 feet, with rolling walker, with supervision  Pt Will Go Up / Down Stairs: 6-10 stairs, with contact guard assist, With rail       PPE worn during session: procedural mask and gloves    Tech present: N/A  PPE worn by tech: N/A    Time of treatment:   PT Received On: 12/15/21  Start Time: 1100  Stop Time: 1130  Time Calculation (min): 30 min    Dionisio David, PT, DPT, MSEd  Pager: [phone]

## 2021-12-15 NOTE — Consults (Signed)
[facility] Palliative Medicine & Comprehensive Care  Service Phone Number: [facility]: [phone], Mon-Fri 9a-4p / Xtend Pager: [phone] (24/7)     Palliative Care Consult   Date Time: 12/15/21 3:17 PM   Patient Name: Jessica [name], Jessica [name]   Location: BJ478/GN562-13   Attending Physician: Halford Decamp, MD   Primary Care Physician: Kandis Mannan, MD   Consulting Provider: Namon Cirri, MD   Consulting Service: Palliative Medicine and Comprehensive Care  Consulted request from Halford Decamp, MD to see patient regarding:   Reason for Referral: Pain; Clarify goals of care; End of life     Palliative Diagnosis Category: Cardiac  Patient Type: New     Assessment & Plan   Impression   Jessica [name] 88 y.o. female with pmh type A aortic dissection s/p open repair 2021, type B dissetion s/p TEVAR (august 2022) c/b endoleaks with BP management. Poor surgical candidate and patient does not want to pursue any more surgery. Palliative consulted for goals of care and pain management        Estimated Prognosis: Weeks to months         Recommendations   1. Goals of Care:   Patient has capacity for medical decision making  No prior AD, default next of kin is majority of her 4 children  - Lives with her daughter Koren Plyler [phone]  Patient does not want to pursue surgery, her main goal is to get home. Per primary surgical service, prognosis for her aneurysm is unclear, but she could have a catastrophic event at any time. Conservative recommendations are to avoid exertion and BP control  Pt and family have already been discussing hospice (her daughter Vear Clock was a Biomedical scientist for many years) and are interested  Discussed code status and confirmed no CPR no intubation  Hospice informational visit requested      Code Status:       - NO CPR - SUPPORT OK      Advance Care Planning:  ACP Validation: Advance care planning discussion initialized  Medical Decision Maker: Next of Kin status: children: Majority of 4 children  - Contact:  Miguelina Fore [phone]  ACP Document: None    2. Symptom Management:    Pain - secondary to known dissection/aneurysm  Control is fair on current regimen    - Avoid exertion to the extent possible  - continue acetaminophen 500mg  Q6H scheduled  - continue lidoderm patch  - increase frequency of oxycodone 5mg  to Q4H prn mod-severe pain      Constipation  - last BM 1/1  - goal to avoid exertion  - continue miralax  - continue senna 1 tablet BID     Dyspnea: denies  Appetite/Nutrition: poor appetite, continue marinol  Nausea/Vomiting: zofran prn    3. Psychosocial: Lives with her daughter Abeer Deskins, has good family support    4. Spiritual: No religion on file.   - Chaplain available for support    5. PC Team follow-up plans: in the next few days      Discharge Disposition: Hospice - home      Outpatient Follow Up Recommended: No    Outcomes: Family meetings, Improved pain intervention, Clarified goals of care, Counseled regarding hospice, and Changed CPR status        Namon Cirri, MD  Palliative Medicine & Comprehensive Care  Phone Number: [facility]: [phone], Mon-Fri 9a-4p / Xtend Pager: [phone] (24/7)     History of Presenting Illness   Jessica [name] is a  87 y.o. female admitted to hospital on 12/13/2021 with Flank pain [R10.9]     Background:  Marital Status: widowed  Family: 4 children  Home/Living situation: Lives at home with her daughter [name]  Occupation/Hobbies: Worked in Development worker, community, enjoys bowling, the casino and bingo    Met with the patient, her brother, sister-in-law and daughter Vear Clock.  They report good understanding of her overall condition and uncertainty with regard to prognosis.  Family reports that they have discussed hospice at length and family has experience including her daughter who is a Biomedical scientist for 10 years and that they are interested in hospice services which may benefit her at home.  The patient has been clear that she does not want to pursue additional  surgery nor does she want to go to rehab facilities and wants to focus on resting and being at home for what ever amount of time she has left.  She is hopeful that she has a good amount of time however she is more hopeful that her pain can be well controlled.    She feels that on her current regimen her control is good and has not had any issues since yesterday.  Yesterday, she had pain when she was straining for a bowel movement.    She reports that she is sleeping well as long as she is not in pain.  She has a poor appetite, she denies nausea, dyspnea or other symptoms.         Goals of Care   CURRENT CPR Status: NO CPR - SUPPORT OK       Advanced Care Planning:      Decisional Capacity: yes      Advance Directives have been completed in the past: no      Advance Directives are available in chart: no      Discussed this admission: no      ACP note completed: no       Date:      GOC Discussion: Goal is to get home and maximize support      Outcome of Discussion: hospice        Palliative Functional and Symptom Assessment      ADLs prior to admission:    Independent Needs assistance Dependent   Ambulation []  [x]  []    Transferring []  []  []    Dressing [x]  []  []    Bathing [x]  []  []    Toileting [x]  []  []    Feeding [x]  []  []      Palliative Performance Scale: 40% - Mainly in bed, unable to do any work, extensive disease, mainly assistance with self-care, normal or reduced intake, full LOC, drowsy or confusion    FAST Score (Dementia Patients): N/A    ECOG (Cancer Patients): N/A    [place] Symptom Assessment Scale (ESAS): Completed  All items 0 = best, 10 = worst  Anxiety: 0  Depression: 0  Drowsiness: 4  Lack of Appetite: 6  Nausea: 0  Pain: 2  Shortness of Breath: 0  Tiredness: 4  Well-being: 5     Review of Systems     [x]  As per HPI, above ESAS and physical exam, otherwise all systems negative    Pain: present - adequately treated  Pain Location and Description: mainly abdominal, and chest, worse with exertion           Past Medical, Surgical and Family History   Past Medical History:   Diagnosis Date    Atrophic kidney  Right    CAD (coronary artery disease)     Dissection of thoracic aorta     Type A dissection    Humerus fracture 05/2021    L Arm in sling s/p fall per dtr    Iron deficiency anemia     TIA (transient ischemic attack) 07/01/2001      Past Surgical History:   Procedure Laterality Date    APPENDECTOMY (OPEN)      GLAUCOMA SURGERY Bilateral     HYSTERECTOMY      MOUTH SURGERY      tonsillectomy    s/p hemiarch repair      THORACIC AORTIC STENT GRAFT (TAA) N/A 08/06/2021    Procedure: TEVAR;  Surgeon: Halford Decamp, MD;  Location: [facility];  Service: Cardiovascular;  Laterality: N/A;      No family history on file.    Social History   Substance Use:    reports that she quit smoking about 33 years ago. Her smoking use included cigarettes. She has a 20.00 pack-year smoking history. She has never used smokeless tobacco.    reports that she does not currently use alcohol.    reports no history of drug use.     Cultural Concerns:    Translator needed: [x]  NO   []  YES                                  Spirituality and Importance: No religion on file      Medications   Scheduled Meds  Current Facility-Administered Medications   Medication Dose Route Frequency    acetaminophen  500 mg Oral 4 times per day    allopurinol  300 mg Oral Daily    aspirin  81 mg Oral Daily    atorvastatin  40 mg Oral Daily    bisacodyl  10 mg Rectal Daily    bisoprolol  5 mg Oral Daily    digoxin  0.0625 mg Oral Daily    dronabinol  2.5 mg Oral BID Meals    famotidine  20 mg Oral Daily    ferrous sulfate  324 mg Oral QAM W/BREAKFAST    levothyroxine  25 mcg Oral Daily at 0600    lidocaine  1 patch Transdermal Daily    polyethylene glycol  17 g Oral Daily    senna-docusate  1 tablet Oral BID    vitamin C  250 mg Oral Daily    vitamin D  25 mcg Oral Daily      DRIPS   lactated ringers 50 mL/hr at 12/13/21 0906      PRN MEDS  Current  Facility-Administered Medications   Medication Dose    acetaminophen  650 mg    albuterol sulfate HFA  2 puff    cyproheptadine  4 mg    dextrose  15 g of glucose    And    dextrose  12.5 g    And    dextrose  12.5 g    And    glucagon (rDNA)  1 mg    magnesium sulfate  1 g    melatonin  3 mg    naloxone  0.2 mg    ondansetron  4 mg    Or    ondansetron  4 mg    oxyCODONE  5 mg    potassium & sodium phosphates  2 packet    potassium chloride  0-40 mEq    And    potassium chloride  10 mEq       Allergies   No Known Allergies    Physical Exam   BP 92/53   Pulse 88   Temp 97.9 F (36.6 C) (Oral)   Resp 20   Wt 47.4 kg (104 lb 8 oz)   SpO2 (!) 81%   BMI 17.94 kg/m    Physical Exam:  General: ill appearing woman lying in bed in NAD   HEENT:  EOMI, anicteric, MM dry  Neck: supple   CV: RRR, no murmur  Lungs:unlabored, CTAB, no wheeze/crackles   Abd: soft, NT, ND, NABS, no rebound or guarding  Ext: no edema  Neuro: awake, alert, oriented x 3, no focal deficits  Psych:  appropriate insight and judgement, mood and affect congruent with current condition  Skin: no rashes or lesions noted      Labs / Radiology   Lab and diagnostics: reviewed in Epic  Recent Labs   Lab 12/15/21  0914 12/15/21  0451   WBC  --  10.33*   Hgb 7.0* 6.5*   Hematocrit 24.4* 22.8*   Platelets  --  283       Recent Labs   Lab 12/15/21  0451   PT 12.9   PT INR 1.1   PTT 26*        Recent Labs   Lab 12/15/21  0451   Sodium 142   Potassium 4.1   Chloride 108   CO2 26   BUN 25.0*   Creatinine 1.2*   EGFR 42.7   Glucose 78   Calcium 8.7     Recent Labs   Lab 12/14/21  0459   Bilirubin, Total 0.7   Protein, Total 7.8   Albumin 1.7*   ALT 19   AST (SGOT) 35          [place] Groin Pseudoaneurysm Left W Dopp    Result Date: 12/15/2021  Thrombosed left common femoral artery pseudoaneurysm. [name], MD  12/15/2021 9:57 AM    [place] Groin Pseudoaneurysm Left W Dopp    Result Date: 12/14/2021  Successful therapy with thrombosed pseudoaneurysm on the left side status  post thrombin injection Barbaraann Faster, DO  12/14/2021 3:47 PM    [place] Groin Pseudoaneurysm Left W Dopp    Result Date: 12/13/2021   5.1 cm pseudoaneurysm arising from the left common femoral artery. [name], MD  12/13/2021 3:33 PM    CT Angiogram Chest Abdomen Pelvis W WO Contrast    Result Date: 12/13/2021   1. Status post thoracic endovascular stent graft placement for a complex Type 3B aortic dissection with aneurysm with multiple endoleaks including 3 type III endoleaks and a distal type IB endoleak as further detailed above. Significant interval increase in size of the excluded aneurysm. 2. Left groin hematoma overlying the left common femoral artery which demonstrates a focal dissection and a linear blush of contrast which is suspicious for pseudoaneurysm. This should also be in the differential although no definitive venous flow is detected on this single arterial phase. Dedicated Doppler ultrasound is recommended. 3.  Status post reconstruction of the ascending thoracic aorta with aneurysmal dilatation of the aortic root. Focal dissection within the aortic root at the level of the noncoronary cusp is stable. The urgent findings were discussed with Rinaldo Ratel, NP at 1320 hours on 12/13/2021. Launa Flight, MD  12/13/2021 1:22 PM

## 2021-12-16 LAB — CBC AND DIFFERENTIAL
Absolute NRBC: 0.07 10*3/uL — ABNORMAL HIGH (ref 0.00–0.00)
Absolute NRBC: 0.06 10*3/uL — ABNORMAL HIGH (ref 0.00–0.00)
Basophils Absolute Automated: 0.01 10*3/uL (ref 0.00–0.08)
Basophils Absolute Automated: 0.02 10*3/uL (ref 0.00–0.08)
Basophils Automated: 0.1 %
Basophils Automated: 0.2 %
Eosinophils Absolute Automated: 0.11 10*3/uL (ref 0.00–0.44)
Eosinophils Absolute Automated: 0.1 10*3/uL (ref 0.00–0.44)
Eosinophils Automated: 1 %
Eosinophils Automated: 1 %
Hematocrit: 21.2 % — ABNORMAL LOW (ref 34.7–43.7)
Hematocrit: 23.8 % — ABNORMAL LOW (ref 34.7–43.7)
Hgb: 6.1 g/dL — ABNORMAL LOW (ref 11.4–14.8)
Hgb: 6.6 g/dL — ABNORMAL LOW (ref 11.4–14.8)
Immature Granulocytes Absolute: 0.11 10*3/uL — ABNORMAL HIGH (ref 0.00–0.07)
Immature Granulocytes Absolute: 0.09 10*3/uL — ABNORMAL HIGH (ref 0.00–0.07)
Immature Granulocytes: 1 %
Immature Granulocytes: 0.9 %
Lymphocytes Absolute Automated: 1.62 10*3/uL (ref 0.42–3.22)
Lymphocytes Absolute Automated: 1.51 10*3/uL (ref 0.42–3.22)
Lymphocytes Automated: 14.5 %
Lymphocytes Automated: 14.8 %
MCH: 19.6 pg — ABNORMAL LOW (ref 25.1–33.5)
MCH: 19.4 pg — ABNORMAL LOW (ref 25.1–33.5)
MCHC: 28.8 g/dL — ABNORMAL LOW (ref 31.5–35.8)
MCHC: 27.7 g/dL — ABNORMAL LOW (ref 31.5–35.8)
MCV: 67.9 fL — ABNORMAL LOW (ref 78.0–96.0)
MCV: 70 fL — ABNORMAL LOW (ref 78.0–96.0)
MPV: 10 fL (ref 8.9–12.5)
MPV: 10.2 fL (ref 8.9–12.5)
Monocytes Absolute Automated: 0.69 10*3/uL (ref 0.21–0.85)
Monocytes Absolute Automated: 0.7 10*3/uL (ref 0.21–0.85)
Monocytes: 6.2 %
Monocytes: 6.9 %
Neutrophils Absolute: 8.67 10*3/uL — ABNORMAL HIGH (ref 1.10–6.33)
Neutrophils Absolute: 7.79 10*3/uL — ABNORMAL HIGH (ref 1.10–6.33)
Neutrophils: 77.2 %
Neutrophils: 76.2 %
Nucleated RBC: 0.6 /100 WBC — ABNORMAL HIGH (ref 0.0–0.0)
Nucleated RBC: 0.6 /100 WBC — ABNORMAL HIGH (ref 0.0–0.0)
Platelets: 230 10*3/uL (ref 142–346)
Platelets: 262 10*3/uL (ref 142–346)
RBC: 3.12 10*6/uL — ABNORMAL LOW (ref 3.90–5.10)
RBC: 3.4 10*6/uL — ABNORMAL LOW (ref 3.90–5.10)
RDW: 21 % — ABNORMAL HIGH (ref 11–15)
RDW: 21 % — ABNORMAL HIGH (ref 11–15)
WBC: 11.21 10*3/uL — ABNORMAL HIGH (ref 3.10–9.50)
WBC: 10.21 10*3/uL — ABNORMAL HIGH (ref 3.10–9.50)

## 2021-12-16 LAB — DIRECT ANTIGLOBULIN TEST: Direct Coombs, IgG: POSITIVE

## 2021-12-16 LAB — CROSSMATCH PRBC, 1 UNIT
Expiration Date: 202301132359
Expiration Date: 202301132359
ISBT CODE: 8400
ISBT CODE: 8400
Status: TRANSFUSED
UTYPE: AB POS
UTYPE: AB POS

## 2021-12-16 LAB — BASIC METABOLIC PANEL
Anion Gap: 8 (ref 5.0–15.0)
BUN: 25 mg/dL — ABNORMAL HIGH (ref 7.0–21.0)
CO2: 24 mEq/L (ref 17–29)
Calcium: 8.8 mg/dL (ref 7.9–10.2)
Chloride: 107 mEq/L (ref 99–111)
Creatinine: 1.3 mg/dL — ABNORMAL HIGH (ref 0.4–1.0)
Glucose: 79 mg/dL (ref 70–100)
Potassium: 4.3 mEq/L (ref 3.5–5.3)
Sodium: 139 mEq/L (ref 135–145)

## 2021-12-16 LAB — PT AND APTT
PT INR: 1.3 — ABNORMAL HIGH (ref 0.9–1.1)
PT INR: 1.1 (ref 0.9–1.1)
PT: 15.8 s — ABNORMAL HIGH (ref 10.1–12.9)
PT: 12.6 s (ref 10.1–12.9)
PTT: 24 s — ABNORMAL LOW (ref 27–39)
PTT: 20 s — ABNORMAL LOW (ref 27–39)

## 2021-12-16 LAB — TYPE AND SCREEN
AB Screen Gel: POSITIVE
ABO Rh: AB POS

## 2021-12-16 LAB — MAGNESIUM: Magnesium: 2.3 mg/dL (ref 1.6–2.6)

## 2021-12-16 LAB — GFR: EGFR: 38.9

## 2021-12-16 LAB — ANTIBODY IDENTIFICATION - REFLEX: Antibody ID: POSITIVE

## 2021-12-16 MED ORDER — VENELEX EX OINT
TOPICAL_OINTMENT | Freq: Two times a day (BID) | CUTANEOUS | Status: DC
Start: 2021-12-16 — End: 2021-12-18
  Filled 2021-12-16: qty 56.7

## 2021-12-16 NOTE — Plan of Care (Signed)
Problem: Safety  Goal: Patient will be free from injury during hospitalization  Outcome: Progressing  Goal: Patient will be free from infection during hospitalization  Outcome: Progressing     Problem: Pain  Goal: Pain at adequate level as identified by patient  Outcome: Progressing     Problem: Side Effects from Pain Analgesia  Goal: Patient will experience minimal side effects of analgesic therapy  Outcome: Progressing     Problem: Discharge Barriers  Goal: Patient will be discharged home or other facility with appropriate resources  Outcome: Progressing     Problem: Psychosocial and Spiritual Needs  Goal: Demonstrates ability to cope with hospitalization/illness  Outcome: Progressing     Problem: Moderate/High Fall Risk Score >5  Goal: Patient will remain free of falls  Outcome: Progressing     Problem: Compromised Tissue integrity  Goal: Damaged tissue is healing and protected  Outcome: Progressing  Goal: Nutritional status is improving  Outcome: Progressing

## 2021-12-16 NOTE — PT Progress Note (Signed)
[facility]   Physical Therapy Cancellation Note      Patient:  Jessica [name] MRN#:  [id number]  [facility] Room/Bed:  FI264/FI264-01    12/16/2021    Pt not seen for physical therapy secondary to patient defers due to fatigue/resting in bed. Will continue to follow.    Dionisio David, PT, DPT, MSEd  Pager: [phone]

## 2021-12-16 NOTE — Consults (Signed)
Please see Dr. Namon Cirri consult note from 12/15/21. Clearing order.

## 2021-12-16 NOTE — Discharge Summary (Shared)
CARDIAC SURGERY DISCHARGE SUMMARY  Patient Name:  Jessica MissRena Ek Age: 87 y.o.   Room:  FI264/FI264-01     Date of Admission:   12/13/2021    Date of Discharge:   December 17, 2021      Synopsis   Jessica [name] is a 87 y.o. female pmhx of atrophic kidney, CKD, CAD, TIA, expanding aortic aneurysm and chronic dissection s/p TEVAR with Dr. Alycia Rossettiyan on 08/06/2021 who presents to outside ED with Flank pain.  Pt underwent CTA that showed e/o endoleaks at multiple parts of stent grafts.      PMH:  has a past medical history of Atrophic kidney, CAD (coronary artery disease), Dissection of thoracic aorta, Humerus fracture (05/2021), Iron deficiency anemia, and TIA (transient ischemic attack) (07/01/2001).          Postop Hospital Course and Diagnoses   Pt transferred and admitted to our service and subsquently underwent repeat CTA showing Enlarging excluded aneurysm, type III and a distal type IB endoleak, and LCFA pseudoaneurysm;Patient had the left CFA injected by IR.  Patient endoleaks are non-operable .   Palliative and hospice were consulted, and patient's code status was made  DNR and inpatient hospice consult ordered.    Pt received 1 uprbc for acute on chronic anemia, most likely intragenic from frequent blood draws.       Pain is well controlled at time of discharge and patient is ready for discharge to home on hospice.  Pt back on home o2.            Consultations:   Hospice   Vascular/IR      Discharge Profile:     Vitals:    12/17/21 0838   BP: 108/58   Pulse: 93   Resp: 20   Temp: 98 F (36.7 C)   SpO2: 94%     Results       Procedure Component Value Units Date/Time    Magnesium [[id number]][[id number]] Collected: 12/17/21 0454    Specimen: Blood Updated: 12/17/21 0603     Magnesium 2.3 mg/dL     GFR [[id number]][[id number]] Collected: 12/17/21 0454     Updated: 12/17/21 0603     EGFR 42.7       Basic Metabolic Panel [[id number]][[id number]]  (Abnormal) Collected: 12/17/21 0454    Specimen: Blood  Updated: 12/17/21 0603     Glucose 81 mg/dL      BUN 08.624.0 mg/dL      Creatinine 1.2 mg/dL      Calcium 9.1 mg/dL      Sodium 578142 mEq/L      Potassium 4.5 mEq/L      Chloride 106 mEq/L      CO2 25 mEq/L      Anion Gap 11.0    CBC and differential [[id number]][[id number]]  (Abnormal) Collected: 12/17/21 0454    Specimen: Blood Updated: 12/17/21 0538     WBC 11.09 x10 3/uL      Hgb 8.2 g/dL      Hematocrit 41.328.1 %      Platelets 242 x10 3/uL      RBC 3.90 x10 6/uL      MCV 72.1 fL      MCH 21.0 pg      MCHC 29.2 g/dL      RDW 22 %      MPV 9.6 fL      Neutrophils 76.8 %      Lymphocytes Automated 14.5 %      Monocytes 6.0 %  Eosinophils Automated 1.4 %      Basophils Automated 0.2 %      Immature Granulocytes 1.1 %      Nucleated RBC 0.5 /100 WBC      Neutrophils Absolute 8.51 x10 3/uL      Lymphocytes Absolute Automated 1.61 x10 3/uL      Monocytes Absolute Automated 0.67 x10 3/uL      Eosinophils Absolute Automated 0.16 x10 3/uL      Basophils Absolute Automated 0.02 x10 3/uL      Immature Granulocytes Absolute 0.12 x10 3/uL      Absolute NRBC 0.05 x10 3/uL     Prepare / Crossmatch Red Blood Cells:  One Unit [[id number]] Collected: 12/13/21 0853     Updated: 12/17/21 0044     RBC Leukoreduced RBC Leukoreduced     BLUNIT [id number]     Status released     PRODUCT CODE (NON READABLE) [id number]     Expiration Date [id number]     UTYPE AB POS     ISBT CODE 8400    Prepare / Crossmatch Red Blood Cells:  One Unit [[id number]] Collected: 12/16/21 1318     Updated: 12/17/21 0042     RBC Leukoreduced RBC Leukoreduced     BLUNIT [id number]     Status transfused     PRODUCT CODE (NON READABLE) [id number]     Expiration Date [id number]     UTYPE AB POS     ISBT CODE 8400    Type and Screen [[id number]] Collected: 12/16/21 1318    Specimen: Blood Updated: 12/16/21 1531     ABO Rh AB POS     AB Screen Gel POS    Narrative:      Pre-Surgical/Pre-Procedure->Yes  Has the patient been transfused w/i the last 3  months?->Unknown  Has patient  been pregnant within past 3 months?->No  For transfusion?->Yes    Antibody Identification [[id number]] Collected: 12/16/21 1318     Updated: 12/16/21 1531     Antibody ID POS, Other non specified antibody    Narrative:      Pre-Surgical/Pre-Procedure->Yes  Has the patient been transfused w/i the last 3  months?->Unknown  Has patient been pregnant within past 3 months?->No  For transfusion?->Yes    Direct Coombs Test (DAT), IgG [[id number]] Collected: 12/16/21 1318     Updated: 12/16/21 1529     Direct Coombs, IgG POS    Narrative:      Pre-Surgical/Pre-Procedure->Yes  Has the patient been transfused w/i the last 3  months?->Unknown  Has patient been pregnant within past 3 months?->No  For transfusion?->Yes    Prepare / Crossmatch Red Blood Cells:  One Unit, 1 Units [[id number]] Collected: 12/13/21 0853     Updated: 12/16/21 1308    Narrative:      Special Requirement->Leukoreduced  RBC Criteria->Hemoglobin less than 8  Indication less than 8 g/dL->acute respiratory failure,  inadequate cardiac output, or inadequate oxygenation            Discharge Medications:        Medication List        START taking these medications      oxyCODONE 5 MG immediate release tablet  Commonly known as: ROXICODONE  Take 1 tablet (5 mg) by mouth every 4 (four) hours as needed for Pain            CHANGE how you take these medications      acetaminophen 325 MG tablet  Commonly known as: TYLENOL  Take  2 tablets (650 mg) by mouth every 4 (four) hours as needed for Pain May substitute for 1 bottle OTC if not covered by insurance  What changed: additional instructions            CONTINUE taking these medications      albuterol sulfate HFA 108 (90 Base) MCG/ACT inhaler  Commonly known as: PROVENTIL     allopurinol 300 MG tablet  Commonly known as: ZYLOPRIM     ascorbic acid 100 MG tablet  Commonly known as: VITAMIN C     aspirin 81 MG EC tablet  Take 1 tablet (81 mg total) by mouth daily     atorvastatin 40 MG tablet  Commonly known as: LIPITOR      bisoprolol 5 MG tablet  Commonly known as: ZEBETA     dronabinol 2.5 MG capsule  Commonly known as: MARINOL     famotidine 40 MG tablet  Commonly known as: PEPCID     ferrous sulfate 325 (65 FE) MG tablet     Latanoprost 0.005 % Emul     levothyroxine 25 MCG tablet  Commonly known as: SYNTHROID     polyethylene glycol 17 g packet  Commonly known as: MIRALAX  Take 17 g by mouth daily as needed (constipation)     senna-docusate 8.6-50 MG per tablet  Commonly known as: PERICOLACE  Take 1 tablet by mouth nightly as needed for Constipation     vitamin D 25 MCG (1000 UT) tablet  Commonly known as: CHOLECALCIFEROL            STOP taking these medications      digoxin 0.25 MG tablet  Commonly known as: LANOXIN     furosemide 40 MG tablet  Commonly known as: LASIX            ASK your doctor about these medications      cyproheptadine 4 MG tablet  Commonly known as: PERIACTIN               Where to Get Your Medications        These medications were sent to [id number] [place] - [address] AT [facility] [place]  [address] [place] [address]      Phone: [phone]   acetaminophen 325 MG tablet  oxyCODONE 5 MG immediate release tablet           Discharge Instructions:   Follow up with hospice and PCP as scheduled.           Signed by: Devona Konig, PA

## 2021-12-16 NOTE — Progress Notes (Signed)
Discharge Planning---Case Management    SWCM attempted to give pt's daughter a call to see if the family interested in moving forward with Hospice.     Malena Catholic, MSW, LMHP-S  Social Worker Case Manager  [facility]- Case Management  [email or web address]  [phone]

## 2021-12-16 NOTE — Progress Notes (Addendum)
Cardiac Surgery Progress Note  Date/Time:  12/16/21 9:36 AM   Patient Name:  Jessica [name] Age: 87 y.o.   Room:  FI264/FI264-01     Operative Profile:   HD#: [id number]    12/30:  Transfer from [facility] for evaluation of L flank pain in setting of recent TEVAR    08/06/21: TEVAR [MDT Val Cap 42 x 42 x 150 mm; 42 x 42 x 100 mm;  42 x 38 x 150 mm; 38 x 38 x 100  mm endografts].  USG guided access and device closure of the R CFA [> 12 Fr] [[id number]] with an 18 Fr Manta device.   Drs. Ryan and [place]    TTE 08/14/21  [facility])  LVEF 55-70%.  Moderate AR.  Mild AV sclerosis.  Moderately elevated RVSP .  Moderate pHTN.     Cardiologist: TBD    Synopsis   Indication for Surgery: chronic type 3B aortic dissection    Post-op Course: Patient's left flank pain has been waxing and waning since last August's procedure with recent escalation. Not worsened by eating. Patient reports 60 lb weight loss since the fall with anorexia of unclear etiology.    PMH:  has a past medical history of Atrophic kidney, CAD (coronary artery disease), Dissection of thoracic aorta, Humerus fracture (05/2021), Iron deficiency anemia, and TIA (transient ischemic attack) (07/01/2001).    Daily Summary (see running discharge summary for prior events)  12/30: Admit for L flank pain, eval TEVAR graft  12/30: to IR for thrombin injection to LCFA  1/2: transfuse 1 uprbc for acute on chronic anemia    Plan:   Remains in the [facility] due to persistent flank pain and medical mgmt of Type III and IB endoleaks  -  Enlarging excluded aneurysm, type III and a distal type IB endoleak, and LCFA pseudoaneurysm;       non-operable. Palliative and hospice consulted, pt now a DNR and inpatient hospice consult ordered.  - hypotension, asymptomatic, BB held  - PT/ OT rec SNF, however pt and family prefer home with hospice  - Nutrition consult for severe protein calorie malnutrition    Dispo: d/c to home with hospice when arranged                System Assessment of Active Issues       Neurological?:  Pain management: Pain is not under control, facility addressed issue by adding scheduled tylenol and lido patch, and PRN oxy    On home dose of allopurinol  Hx of TIA    Cardiovascular:  Type A dissection, s/p open repair (2021)  Chronic Type B dissection, s/p TEVAR (07/2021)  Type III and Ib endoleaks, medical mgmt:   HR and BP control on bisoprolol and digoxin  CAD, on ASA/ statin/ BB    Pulmonary:  Small bilateral pleural effusions and compressive atelectasis.  Will resume diuresis as BP allows     Gastrointestinal:  Anorexia, acute on chronic e/b 60 lb weight loss in last year.  Nutrition consult, Nepro TID ordered     Renal/GU:  IV hydration with LR pre/ post contrasted study  Stage 3b chronic kidney disease with a baseline creatinine of 1.0-1.4: remaining stable  Cr now 1.2     Infectious Disease:  Clinically, no acute issue    Hematological:  Chronic anemia, on iron supplement chronically   transfuse 1 uprbc for acute on chronic anemia present on admission    Endocrinological:  Continue daily levothyroxine for  hypothyroid;  No hx of DM  HgbA1C:   Lab Results   Component Value Date    HGBA1C 5.0 07/31/2021       Subjective:   Denies pain    Physical Exam:   BP 118/67   Pulse 78   Temp 98.1 F (36.7 C) (Oral)   Resp 21   Wt 47.4 kg (104 lb 8 oz)   SpO2 99%   BMI 17.94 kg/m     Physical Exam  Vitals reviewed.   Constitutional:       Comments: Cachectic appearing   HENT:      Head: Normocephalic.   Eyes:      Extraocular Movements: Extraocular movements intact.      Pupils: Pupils are equal, round, and reactive to light.   Cardiovascular:      Rate and Rhythm: Normal rate and regular rhythm.      Heart sounds: Normal heart sounds.   Pulmonary:      Effort: Pulmonary effort is normal.      Comments: Diminished at bases  Abdominal:      General: Abdomen is flat. Bowel sounds are normal.      Palpations: Abdomen is soft.   Musculoskeletal:          General: Normal range of motion.   Skin:     General: Skin is warm and dry.   Neurological:      General: No focal deficit present.      Mental Status: She is alert. Mental status is at baseline.       CBC:  Recent Labs     12/16/21  0634   WBC 10.21*   Hgb 6.6*   Hematocrit 23.8*   Platelets 262       BMP:  Recent Labs     12/16/21  0634   Sodium 139   Potassium 4.3   Chloride 107   CO2 24   BUN 25.0*   Creatinine 1.3*   Glucose 79   Magnesium 2.3   Calcium 8.8               Signed by: Devona Konig, PA

## 2021-12-16 NOTE — Consults (Signed)
WOC Nurse Wound Consult:     Jessica [name] is a 87 y.o. female admitted with Flank pain.    Consult received for: sacrum    LOS:  LOS: 3 days   POD: 3 Days Post-Op    PMH:   Past Medical History:   Diagnosis Date    Atrophic kidney     Right    CAD (coronary artery disease)     Dissection of thoracic aorta     Type A dissection    Humerus fracture 05/2021    L Arm in sling s/p fall per dtr    Iron deficiency anemia     TIA (transient ischemic attack) 07/01/2001     PSH:   Past Surgical History:   Procedure Laterality Date    APPENDECTOMY (OPEN)      GLAUCOMA SURGERY Bilateral     HYSTERECTOMY      MOUTH SURGERY      tonsillectomy    s/p hemiarch repair      THORACIC AORTIC STENT GRAFT (TAA) N/A 08/06/2021    Procedure: TEVAR;  Surgeon: Halford Decamp, MD;  Location: [facility] CARDIAC CATH;  Service: Cardiovascular;  Laterality: N/A;       Braden Score: Braden Scale Score: 20 (12/16/21 0920)    Specialty Bed:    versa care, ordered sizewise immersion bed confirmation #  [id number].    Diet: Nepro Shake Quantity: A. One; Frequency: TID (3 times a day)  Diet Mechanical Soft     Wound Assessment:  Pt frail, appears cachectic     Pt's daughter at bedside    Location:coccyx and low sacrum  Measurements: 3 cm x 1 cm  Wound description: over bony prominence of sacrum there is partial thickness tissue loss with red base, moist  Periwound: otherwise intact  Odor: none  Drainage: moist, no active drainage.  Wound is consistent with stage 2 pressure injury  Cleansed, and applied sacral foam dressing      B heels clear    Pt has soft green chair cushion in room  Recommendations/Plan:     Please continue q 2 hour turns, keeping skin free from stool and urine and floating heels.  Venelex to help increase blood flow locally and provide moisture for wound which it needs to help heal.  Sizewise immersion bed, as pt vulnerable to continued breakdown. Pt's primary RN aware.     Alver Fisher, RN, [facility]  [employer] [id number]  Pager [phone]

## 2021-12-17 ENCOUNTER — Encounter: Payer: Self-pay | Admitting: Interventional Radiology and Diagnostic Radiology

## 2021-12-17 LAB — CBC AND DIFFERENTIAL
Absolute NRBC: 0.05 10*3/uL — ABNORMAL HIGH (ref 0.00–0.00)
Basophils Absolute Automated: 0.02 10*3/uL (ref 0.00–0.08)
Basophils Automated: 0.2 %
Eosinophils Absolute Automated: 0.16 10*3/uL (ref 0.00–0.44)
Eosinophils Automated: 1.4 %
Hematocrit: 28.1 % — ABNORMAL LOW (ref 34.7–43.7)
Hgb: 8.2 g/dL — ABNORMAL LOW (ref 11.4–14.8)
Immature Granulocytes Absolute: 0.12 10*3/uL — ABNORMAL HIGH (ref 0.00–0.07)
Immature Granulocytes: 1.1 %
Lymphocytes Absolute Automated: 1.61 10*3/uL (ref 0.42–3.22)
Lymphocytes Automated: 14.5 %
MCH: 21 pg — ABNORMAL LOW (ref 25.1–33.5)
MCHC: 29.2 g/dL — ABNORMAL LOW (ref 31.5–35.8)
MCV: 72.1 fL — ABNORMAL LOW (ref 78.0–96.0)
MPV: 9.6 fL (ref 8.9–12.5)
Monocytes Absolute Automated: 0.67 10*3/uL (ref 0.21–0.85)
Monocytes: 6 %
Neutrophils Absolute: 8.51 10*3/uL — ABNORMAL HIGH (ref 1.10–6.33)
Neutrophils: 76.8 %
Nucleated RBC: 0.5 /100 WBC — ABNORMAL HIGH (ref 0.0–0.0)
Platelets: 242 10*3/uL (ref 142–346)
RBC: 3.9 10*6/uL (ref 3.90–5.10)
RDW: 22 % — ABNORMAL HIGH (ref 11–15)
WBC: 11.09 10*3/uL — ABNORMAL HIGH (ref 3.10–9.50)

## 2021-12-17 LAB — BASIC METABOLIC PANEL
Anion Gap: 11 (ref 5.0–15.0)
BUN: 24 mg/dL — ABNORMAL HIGH (ref 7.0–21.0)
CO2: 25 mEq/L (ref 17–29)
Calcium: 9.1 mg/dL (ref 7.9–10.2)
Chloride: 106 mEq/L (ref 99–111)
Creatinine: 1.2 mg/dL — ABNORMAL HIGH (ref 0.4–1.0)
Glucose: 81 mg/dL (ref 70–100)
Potassium: 4.5 mEq/L (ref 3.5–5.3)
Sodium: 142 mEq/L (ref 135–145)

## 2021-12-17 LAB — GFR: EGFR: 42.7

## 2021-12-17 LAB — MAGNESIUM: Magnesium: 2.3 mg/dL (ref 1.6–2.6)

## 2021-12-17 MED ORDER — OXYCODONE HCL 5 MG PO TABS
5.0000 mg | ORAL_TABLET | ORAL | 0 refills | Status: AC | PRN
Start: 2021-12-17 — End: 2021-12-24

## 2021-12-17 MED ORDER — ACETAMINOPHEN 325 MG PO TABS
650.0000 mg | ORAL_TABLET | ORAL | 0 refills | Status: AC | PRN
Start: 2021-12-17 — End: ?

## 2021-12-17 NOTE — Progress Notes (Signed)
[facility] Palliative Medicine & Comprehensive Care   Service Phone Number: [facility]: [phone], Mon-Fri 9a-4p / [facility]end Pager: #[phone] (24/7)     Palliative Care Progress Note   Date Time: 12/17/21 1:03 PM   Patient Name: Jessica [name], Jessica [name]   Location: [id number]/GN562-13   Attending Physician: Halford Decamp, MD   Primary Care Physician: Kandis Mannan, MD   Consulting Provider: Namon Cirri, MD   Consulting Service: Palliative Medicine and Comprehensive Care  Consulted request from Halford Decamp, MD to see patient regarding:   Reason for Referral: Pain; Clarify goals of care; End of life          Assessment & Plan   Impression   Jessica [name] 87 y.o. female with pmh type A aortic dissection s/p open repair 2021, type B dissetion s/p TEVAR (august 2022) c/b endoleaks with BP management. Poor surgical candidate and patient does not want to pursue any more surgery. Palliative consulted for goals of care and pain management           Estimated Prognosis: Weeks to months         Recommendations   1. Goals of Care:   Patient has capacity for medical decision making  No prior AD, default next of kin is majority of her 4 children  - Lives with her daughter Jessica [name] [phone]  Patient does not want to pursue surgery, her main goal is to get home. Per primary surgical service, prognosis for her aneurysm is unclear, but she could have a catastrophic event at any time. Conservative recommendations are to avoid exertion and BP control  Pt and family have already been discussing hospice (her daughter Jessica [name] was a Biomedical scientist for many years) and are interested  Discussed code status and confirmed no CPR no intubation  Hospice informational visit requested - pt and family deciding on agency      Code Status:       - NO CPR - SUPPORT OK      Advance Care Planning:  ACP Validation: Advance care planning discussion initialized  Medical Decision Maker: Next of Kin status: children: Majority of 4 children  - Contact: Jessica [name] [phone]  ACP Document: None      2. Symptom Management:    Pain - secondary to known dissection/aneurysm  Control is fair on current regimen     - Avoid exertion to the extent possible  - continue acetaminophen 500mg  Q6H scheduled  - continue lidoderm patch  - continue oxycodone 5mg  to Q4H prn mod-severe pain        Constipation  - last BM 1/3  - goal to avoid exertion, bowel regimen should help  - continue miralax  - continue senna 1 tablet BID      Dyspnea: denies  Appetite/Nutrition: poor appetite, continue marinol  Nausea/Vomiting: zofran prn    3. Psychosocial: Lives with her daughter Jessica [name], has good family support  Planning for discharge home once hospice services are arranged    4. Spiritual:   - Religion: No religion on file.   - Chaplain available for support     5. PC Team follow-up plans: tomorrow      Discharge Disposition: Hospice - home      Outpatient Follow Up Recommended: No    Outcomes: Family meetings, Improved pain intervention, Improved non-pain symptom therapy, Clarified goals of care, Counseled regarding hospice, Changed CPR status, and Transitioned to hospice        Namon Cirri, MD  Palliative Medicine & Comprehensive Care  Phone Number: [phone], Mon-Fri 9a-4p   [employer]end Pager: [phone] (24/7)     Interval History   Jessica [name] is a 87 y.o. female admitted to hospital on 12/13/2021 with Flank pain [R10.9]     Background:  Marital Status: widowed  Family: 4 children  Home/Living situation: Lives at home with her daughter Jessica [name]  Occupation/Hobbies: Worked in Development worker, community, enjoys bowling, the casino and bingo     =---- Interval ----    Pt and family have been in touch with hospice agencies, currently making arrangements. Pt feels well, having a [employer] hamburger for lunch. Pain si minimal when she is  non-exertional     Goals of Care   CURRENT CPR Status: NO CPR - SUPPORT OK       Advanced Care Planning:      Decisional Capacity: yes      Advance  Directives have been completed in the past: no      Advance Directives are available in chart: no      Discussed this admission: no      ACP note completed: no       Date:       GOC Discussion: Goal is to get home and maximize support       Outcome of Discussion: home hospice        Palliative Functional and Symptom Assessment     Palliative Performance Scale: 40% - Mainly in bed, unable to do any work, extensive disease, mainly assistance with self-care, normal or reduced intake, full LOC, drowsy or confusion    [place] Symptom Assessment Scale (ESAS): Completed  All items 0 = best, 10 = worst  Anxiety: 0  Depression: 0  Drowsiness: 0  Lack of Appetite: 7  Nausea: 0  Pain: 1  Shortness of Breath: 0  Tiredness: 3  Well-being: 5     Medications   Scheduled Meds  Current Facility-Administered Medications   Medication Dose Route Frequency    acetaminophen  500 mg Oral 4 times per day    allopurinol  300 mg Oral Daily    aspirin  81 mg Oral Daily    atorvastatin  40 mg Oral Daily    balsam peru-castor oil (VENELEX)   Topical Q12H SCH    bisacodyl  10 mg Rectal Daily    bisoprolol  5 mg Oral Daily    dronabinol  2.5 mg Oral BID Meals    famotidine  20 mg Oral Daily    ferrous sulfate  324 mg Oral QAM W/BREAKFAST    levothyroxine  25 mcg Oral Daily at 0600    lidocaine  1 patch Transdermal Daily    polyethylene glycol  17 g Oral Daily    senna-docusate  1 tablet Oral BID    vitamin C  250 mg Oral Daily    vitamin D  25 mcg Oral Daily      DRIPS     PRN MEDS  Current Facility-Administered Medications   Medication Dose    acetaminophen  650 mg    albuterol sulfate HFA  2 puff    cyproheptadine  4 mg    dextrose  15 g of glucose    And    dextrose  12.5 g    And    dextrose  12.5 g    And    glucagon (rDNA)  1 mg    magnesium sulfate  1 g    melatonin  3  mg    naloxone  0.2 mg    ondansetron  4 mg    Or    ondansetron  4 mg    oxyCODONE  5 mg    potassium & sodium phosphates  2 packet    potassium chloride  0-40 mEq    And     potassium chloride  10 mEq       Allergies   No Known Allergies    Physical Exam   BP 104/67   Pulse 79   Temp 97.9 F (36.6 C) (Oral)   Resp 19   Wt 47.4 kg (104 lb 8 oz)   SpO2 100%   BMI 17.94 kg/m    Physical Exam:  General: elderly woman lying in bed in NAD   HEENT:  EOMI, anicteric, MM dry  Neck: supple   CV: RRR, no murmur  Lungs:unlabored, CTAB, no wheeze/crackles   Abd: soft, NT, ND, hypoactive BS, no rebound or guarding  Ext: no edema  Neuro: awake, alert, oriented x 3, no focal deficits  Psych:  appropriate insight and judgement, mood and affect congruent with current condition  Skin: no rashes or lesions noted      Labs / Radiology   Lab and diagnostics: reviewed in Epic  Recent Labs   Lab 12/17/21  0454   WBC 11.09*   Hgb 8.2*   Hematocrit 28.1*   Platelets 242       Recent Labs   Lab 12/16/21  0634   PT 12.6   PT INR 1.1   PTT 20*        Recent Labs   Lab 12/17/21  0454   Sodium 142   Potassium 4.5   Chloride 106   CO2 25   BUN 24.0*   Creatinine 1.2*   EGFR 42.7   Glucose 81   Calcium 9.1     Recent Labs   Lab 12/14/21  0459   Bilirubin, Total 0.7   Protein, Total 7.8   Albumin 1.7*   ALT 19   AST (SGOT) 35          [place] Groin Pseudoaneurysm Left W Dopp    Result Date: 12/15/2021  Thrombosed left common femoral artery pseudoaneurysm. Jessica Favre, MD  12/15/2021 9:57 AM    [place] Groin Pseudoaneurysm Left W Dopp    Result Date: 12/14/2021  Successful therapy with thrombosed pseudoaneurysm on the left side status post thrombin injection Jessica Faster, DO  12/14/2021 3:47 PM    [place] Groin Pseudoaneurysm Left W Dopp    Result Date: 12/13/2021   5.1 cm pseudoaneurysm arising from the left common femoral artery. Jessica Favre, MD  12/13/2021 3:33 PM    CT Angiogram Chest Abdomen Pelvis W WO Contrast    Result Date: 12/13/2021   1. Status post thoracic endovascular stent graft placement for a complex Type 3B aortic dissection with aneurysm with multiple endoleaks including 3 type III endoleaks and a distal  type IB endoleak as further detailed above. Significant interval increase in size of the excluded aneurysm. 2. Left groin hematoma overlying the left common femoral artery which demonstrates a focal dissection and a linear blush of contrast which is suspicious for pseudoaneurysm. This should also be in the differential although no definitive venous flow is detected on this single arterial phase. Dedicated Doppler ultrasound is recommended. 3.  Status post reconstruction of the ascending thoracic aorta with aneurysmal dilatation of the aortic root. Focal dissection within the aortic root at  the level of the noncoronary cusp is stable. The urgent findings were discussed with Jessica Ratel, NP at 1320 hours on 12/13/2021. Launa Flight, MD  12/13/2021 1:22 PM    Pseudoaneurysm Repair    Result Date: 12/15/2021  Successful thrombin injection of the left common femoral artery pseudoaneurysm. Tonna Boehringer, MD  12/15/2021 3:26 PM

## 2021-12-17 NOTE — Progress Notes (Signed)
Received Hospice Consult. Order processed and Hospice Team aware.     Jessica [name]

## 2021-12-17 NOTE — PT Progress Note (Signed)
Physical Therapy Treatment  Jessica [name]  Post Acute Care Therapy Recommendations:     Discharge Recommendations:  SNF    If SNF  recommended discharge disposition is not available, patient will need physical assist for functional mobility and HHPT.     DME needs IF patient is discharging home: [facility]    Therapy discharge recommendations may change with patient status.  Please refer to most recent note for up-to-date recommendations.      Assessment:   Significant Findings: None    Pt limited by L flank pain and only able to tolerate sitting EOB briefly x2 trials. Pt currently SBA to min A for supine <> sit and max A for repositioning in bed. Will progress as tolerated. Pt presents with decreased functional strength, balance, and activity tolerance, which limits pt's functional mobility. Pt would benefit from continued skilled PT to address these deficits to facilitate return to PLOF and decrease risk of falling.    Assessment: Decreased LE strength, Decreased endurance/activity tolerance, Decreased functional mobility, Decreased balance, Gait impairment  Progress: Progressing toward goals  Prognosis: Good, With continued PT status post acute discharge  Risks/Benefits/POC Discussed with Pt/Family: With patient  Patient left without needs and call bell within reach. RN notified of session outcome.     Treatment Activities: Bed mobility, sitting balance, TA    Educated the patient to role of physical therapy, plan of care, goals of therapy and safety with mobility and ADLs.    Plan:   Treatment/Interventions: Exercise, Gait training, Stair training, Neuromuscular re-education, Functional transfer training, LE strengthening/ROM, Patient/family training, Bed mobility, Continued evaluation, Endurance training        PT Frequency: 3-4x/wk     Continue plan of care.    [facility]  Bed: FI264/FI264-01     Precautions and Contraindications:   Precautions  Other Precautions: Moderate Fall  Risk    Updated Medical Status/Imaging/Labs: Reviewed, no barriers to therapy.    Subjective:   Patient Goal: go home    Pain Assessment  Pain Assessment: Numeric Scale (0-10)  Pain Score: 7-severe pain  POSS Score: Awake and Alert  Pain Location: Other (Comment) (Flank)  Pain Orientation: Left  Pain Intervention(s): Repositioned;Rest    Patient's medical condition is appropriate for Physical Therapy intervention at this time.  Patient is agreeable to participation in the therapy session. Nursing clears patient for therapy.    Objective:   Observation of Patient/Vital Signs:  Patient is in bed with telemetry, external urinary catheter in place.  Pt wore mask during therapy session:No      Cognition/Neuro Status  Arousal/Alertness: Appropriate responses to stimuli  Attention Span: Appears intact  Orientation Level:  (NT)  Following Commands: Follows one step commands with increased time;Follows one step commands with repetition  Safety Awareness: moderate verbal instruction  Insights: Decreased awareness of deficits;Educated in safety awareness  Behavior: cooperative  Orientation Level: Oriented X4       Functional Mobility:  Rolling: Minimal Assist;to Right  Supine to Sit: Stand by Assist;Increased Time;Increased Effort;HOB raised  Scooting to HOB: Maximal Assist (x2 assist)  Sit to Supine: Stand by Assist  Sit to Stand: Unable to assess (Comment) (decreased tolerance for sitting due to pain)       Therapeutic Exercise  With activity    PMP - Progressive Mobility Protocol   PMP Activity: Step 4 - Dangle at Bedside  Distance Walked (ft) (Step 6,7): 0 Feet       Patient Participation: Good  Patient Endurance: Fair    Patient left with call bell within reach, all needs met, SCDs off as found, fall mat in place, bed alarm off as found, chair alarm N/A and all questions answered. RN notified of session outcome and patient response.     Goals:  Goals  Goal Formulation: With patient  Time for Goal Acheivement: 5  visits  Goals: Select goal  Pt Will Go Supine To Sit: with supervision  Pt Will Perform Sit To Supine: with supervision  Pt Will Perform Sit to Stand: with supervision  Pt Will Ambulate: 101-150 feet, with rolling walker, with supervision  Pt Will Go Up / Down Stairs: 6-10 stairs, with contact guard assist, With rail    PPE worn during session: procedural mask and gloves    Tech present: N/A  PPE worn by tech: N/A    Time of Treatment  PT Received On: 12/17/21  Start Time: 1041  Stop Time: 1055  Time Calculation (min): 14 min  Treatment # 1 out of 5 visits    Dionisio David, PT, DPT, MSEd  Pager: [phone]

## 2021-12-17 NOTE — Progress Notes (Addendum)
Per CV surgery patient is ready for discharge home with hospice today.  He went on to say the family is agreeable to home with hospice care    Writer spoke with patient's [name] [phone] to follow up, she is agreeable to hospice care at home.  She went on to say she never received a phone call or a list of hospice.  She would still like to speak with [facility]  about their services.  She received a call from [facility], family has not agreed to admitting with them.    [name] is at work today, her sister will be visiting today and can receive information.   Sister is planning to arrive by 12pm, but [name] will ask if she can arrive earlier.    Writer to follow up with [facility]    944AM: [name] called back stating they have decided on [facility].  Message left for liaison Fayrene Fearing [phone] asking if he needs additional clinical and that patient is ready for discharge once they have completed her admission    1251 PM: Writer followed up with [facility] [phone] they will have the liaison call this writer with an update    Grace Blight MSW  Case Manager  [facility]  [phone]  [email or web address]

## 2021-12-17 NOTE — Progress Notes (Signed)
Cardiac Surgery Progress Note  Date/Time:  12/17/21 12:06 PM   Patient Name:  Jessica [name] Age: 87 y.o.   Room:  FI264/FI264-01     Operative Profile:   HD#: 5    12/30:  Transfer from [facility] for evaluation of L flank pain in setting of recent TEVAR    08/06/21: TEVAR [MDT Val Cap 42 x 42 x 150 mm; 42 x 42 x 100 mm;  42 x 38 x 150 mm; 38 x 38 x 100  mm endografts].  USG guided access and device closure of the R CFA [> 12 Fr] [01100] with an 18 Fr Manta device.   Drs. Ryan and Powell    TTE 08/14/21  [facility])  LVEF 55-70%.  Moderate AR.  Mild AV sclerosis.  Moderately elevated RVSP .  Moderate pHTN.     Cardiologist: TBD    Synopsis   Indication for Surgery: chronic type 3B aortic dissection    Post-op Course: Patient's left flank pain has been waxing and waning since last August's procedure with recent escalation. Not worsened by eating. Patient reports 60 lb weight loss since the fall with anorexia of unclear etiology.    PMH:  has a past medical history of Atrophic kidney, CAD (coronary artery disease), Dissection of thoracic aorta, Humerus fracture (05/2021), Iron deficiency anemia, and TIA (transient ischemic attack) (07/01/2001).    Daily Summary (see running discharge summary for prior events)  12/30: Admit for L flank pain, eval TEVAR graft  12/30: to IR for thrombin injection to LCFA  1/2: transfuse 1 uprbc for acute on chronic anemia      Plan:   Remains in the CVSD due to persistent flank pain and medical mgmt of Type III and IB endoleaks  -  Enlarging excluded aneurysm, type III and a distal type IB endoleak, and LCFA pseudoaneurysm;       non-operable. Palliative and hospice consulted, pt now a DNR and inpatient hospice consult ordered.  - PT/ OT rec SNF, however pt and family prefer home with hospice  - Nutrition consult for severe protein calorie malnutrition    Dispo: d/c to home with hospice when arranged               System Assessment of Active  Issues       Neurological?:  Pain management: Pain is not under control, facility addressed issue by adding scheduled tylenol and lido patch, and PRN oxy    On home dose of allopurinol  Hx of TIA    Cardiovascular:  Type A dissection, s/p open repair (2021)  Chronic Type B dissection, s/p TEVAR (07/2021)  Type III and Ib endoleaks, medical mgmt:   HR and BP control on bisoprolol and digoxin  CAD, on ASA/ statin/ BB    Pulmonary:  Small bilateral pleural effusions and compressive atelectasis.  Will resume diuresis as BP allows     Gastrointestinal:  Anorexia, acute on chronic e/b 60 lb weight loss in last year.  Nutrition consult, Nepro TID ordered     Renal/GU:  IV hydration with LR pre/ post contrasted study  Stage 3b chronic kidney disease with a baseline creatinine of 1.0-1.4: remaining stable  Cr now 1.2     Infectious Disease:  Clinically, no acute issue    Hematological:  Chronic anemia, on iron supplement chronically   transfuse 1 uprbc for acute on chronic anemia present on admission ,most likely 2/2 blood draws    Endocrinological:  Continue daily levothyroxine  for hypothyroid;  No hx of DM  HgbA1C:   Lab Results   Component Value Date    HGBA1C 5.0 07/31/2021       Subjective:   Denies pain    Physical Exam:   BP 104/67   Pulse 79   Temp 97.9 F (36.6 C) (Oral)   Resp 19   Wt 47.4 kg (104 lb 8 oz)   SpO2 100%   BMI 17.94 kg/m     Physical Exam  Vitals reviewed.   Constitutional:       Comments: Cachectic appearing   HENT:      Head: Normocephalic.   Eyes:      Extraocular Movements: Extraocular movements intact.      Pupils: Pupils are equal, round, and reactive to light.   Cardiovascular:      Rate and Rhythm: Normal rate and regular rhythm.      Heart sounds: Normal heart sounds.   Pulmonary:      Effort: Pulmonary effort is normal.      Comments: Diminished at bases  Abdominal:      General: Abdomen is flat. Bowel sounds are normal.      Palpations: Abdomen is soft.   Musculoskeletal:          General: Normal range of motion.   Skin:     General: Skin is warm and dry.   Neurological:      General: No focal deficit present.      Mental Status: She is alert. Mental status is at baseline.       CBC:  Recent Labs     12/17/21  0454   WBC 11.09*   Hgb 8.2*   Hematocrit 28.1*   Platelets 242       BMP:  Recent Labs     12/17/21  0454   Sodium 142   Potassium 4.5   Chloride 106   CO2 25   BUN 24.0*   Creatinine 1.2*   Glucose 81   Magnesium 2.3   Calcium 9.1               Signed by: Devona KonigAlexander C Everlie Eble, PA

## 2021-12-17 NOTE — Plan of Care (Signed)
Problem: Safety  Goal: Patient will be free from injury during hospitalization  Outcome: Adequate for Discharge  Goal: Patient will be free from infection during hospitalization  Outcome: Adequate for Discharge     Problem: Pain  Goal: Pain at adequate level as identified by patient  Outcome: Adequate for Discharge     Problem: Side Effects from Pain Analgesia  Goal: Patient will experience minimal side effects of analgesic therapy  Outcome: Adequate for Discharge     Problem: Discharge Barriers  Goal: Patient will be discharged home or other facility with appropriate resources  Outcome: Adequate for Discharge     Problem: Psychosocial and Spiritual Needs  Goal: Demonstrates ability to cope with hospitalization/illness  Outcome: Adequate for Discharge     Problem: Moderate/High Fall Risk Score >5  Goal: Patient will remain free of falls  Outcome: Adequate for Discharge     Problem: Compromised Tissue integrity  Goal: Damaged tissue is healing and protected  Outcome: Adequate for Discharge  Goal: Nutritional status is improving  Outcome: Adequate for Discharge

## 2021-12-17 NOTE — Plan of Care (Signed)
Moved pt to sizewize bed.     Problem: Safety  Goal: Patient will be free from injury during hospitalization    Outcome: Progressing  Goal: Patient will be free from infection during hospitalization  Outcome: Progressing     Problem: Pain  Goal: Pain at adequate level as identified by patient  Outcome: Progressing     Problem: Side Effects from Pain Analgesia  Goal: Patient will experience minimal side effects of analgesic therapy  Outcome: Progressing     Problem: Discharge Barriers  Goal: Patient will be discharged home or other facility with appropriate resources  Outcome: Progressing     Problem: Psychosocial and Spiritual Needs  Goal: Demonstrates ability to cope with hospitalization/illness  Outcome: Progressing     Problem: Moderate/High Fall Risk Score >5  Goal: Patient will remain free of falls  Outcome: Progressing     Problem: Compromised Tissue integrity  Goal: Damaged tissue is healing and protected  Outcome: Progressing  Goal: Nutritional status is improving  Outcome: Progressing

## 2021-12-17 NOTE — OT Progress Note (Signed)
Occupational Therapy Treatment  Arita Miss        Post Acute Care Therapy Recommendations:     Discharge Recommendations:  SNF    If SNF  recommended discharge disposition is not available, patient will need hands on assist for ADLs, household mobility, rolling walker, bedside commode and HHOT.     DME needs IF patient is discharging home:  (TBD @ Rehab)    Therapy discharge recommendations may change with patient status.  Please refer to most recent note for up-to-date recommendations.    Assessment:   Significant Findings: none    Pt agreeable to OT treatment, pt reports feeling "much better today." Pt sup <> sit with CGA. Pt STS and side steps to [facility] with HHA, CGA. Pt limited by L flank pain and requests to lay back down. Pt educated on dressing and transfer techniques to assist with pain management. Pt made comfortable in bed with needs in reach. Pt is performing below functional baseline and will benefit from continued OT to maximize independence and safety with ADLs and functional transfers/mobility.    Treatment Activities: functional transfers     Educated the patient to role of occupational therapy, plan of care, goals of therapy and HEP, safety with mobility and ADLs, energy conservation techniques, discharge instructions, home safety.    Plan:    OT Frequency Recommended: 2-3x/wk     Continue plan of care.    [facility]  Bed: FI264/FI264-01       Precautions and Contraindications:   falls    Updated Medical Status/Imaging/Labs:  Reviewed     Subjective: "I want to get up"   Patient's medical condition is appropriate for Occupational Therapy intervention at this time.  Patient is agreeable to participation in the therapy session. Nursing clears patient for therapy.    Pain:   Scale: "hurts a lot"  Location: L flank side  Intervention: repositioning     Objective:   Patient is in bed with dressings, telemetry, O2 via nasal cannula, and female external catheter in place.  Pt  wore mask during therapy session:No      Cognition  WFL    Functional Mobility  Rolling:  nt  Supine to Sit: CGA  Sit to Stand: CGA  Transfers: CGA HHA    PMP Activity: Step 6 - Walks in Room    Balance  Static Sitting: good  Dynamic Sitting: good  Static Standing: fair  Dynamic Standing: fair    Self Care and Home Management  Eating: independent   Grooming: CGA EOB  Bathing: mina, educated on seated tech  UE Dressing: minA, educated on modified tech for pain mgmt  LE Dressing: minA  Toileting: CGA    Therapeutic Exercises  With activity    Participation: good  Endurance: fair    Patient left with call bell within reach, all needs met, SCDs off as found, fall mat on, bed alarm on, chair alarm off and all questions answered. RN notified of session outcome and patient response.     Goals:  Time For Goal Achievement: 5 visits  ADL Goals  Patient will groom self: Supervision  Patient will dress upper body: Supervision  Patient will dress lower body: Supervision  Patient will toilet: Supervision  Mobility and Transfer Goals  Pt will transfer bed to toilet: Supervision                           PPE  worn during session: procedural mask and gloves    Time of Treatment  OT Received On: 12/17/21  Start Time: 0900  Stop Time: 0920  Time Calculation (min): 20 min    Treatment # 1 of 5 visits    Randa Lynn, OTR/L  Pager [phone]

## 2021-12-17 NOTE — Plan of Care (Addendum)
Date/Time:  12/17/21 10:07 PM   Patient Name:  Jessica [name]    Age: 87 y.o.   Room:  FI264/FI264-01     Cardiac Surgery RN Shift   Shift Events:   - pain     Neuro  Ambulate with          [x]  assist       []  independently  PT/OT Dispo recs     home-hospice care  Pain Controlled        [x]  Yes           []  No           Cardiac  BP Controlled [x]  Yes          []  No          PRN meds given []  Yes            []  No      Rhythm                    Sinus rhythm      Arrhythmias         []  Yes            []  No      Pacing Wires           []  Yes           [x]  No                           Pulmonary                     O2 NC                      [x]  Yes           []  No                    Chest tubes              []  Yes           [x]  No                                                                            IS                              500    G.I.   Tolerating Diet          [x]  Yes/poor appetite           []  No      BM last 24 hrs          [x]  Yes          []  No               Last BM Date: 12/17/21    PPI ppx                    [x]  Yes          []  No  INTEGUMENTARY                Incisions OTA?          [x]  Yes           []  No        Renal                Foley present            []  Yes          [x]  No                                            Heme  DVT Prophylaxis            [x]  Yes           []  No                    Anticoagulation        []  Yes           [x]  No       ID   Febrile                     []  Yes          []  No        Endocrine   Off insulin drip         [x]  Yes           []  No      BS controlled           [x]  Yes           []  No      _____________________________________________________________________________________         Barriers to discharge:   Pain management  Hospice care  Anticipated discharge date:   tbd/tbd    Priority discharge:  []  Yes            [x]  No        Problem: Safety  Goal: Patient will be free from injury during hospitalization  Outcome: Adequate for Discharge  Goal: Patient will be  free from infection during hospitalization  Outcome: Adequate for Discharge     Problem: Pain  Goal: Pain at adequate level as identified by patient  Outcome: Adequate for Discharge     Problem: Discharge Barriers  Goal: Patient will be discharged home or other facility with appropriate resources  Outcome: Adequate for Discharge

## 2021-12-18 NOTE — Nursing Progress Note (Signed)
Reviewed the following with patient and family.  prescriptions and their common side effects, medications administration guide with when the next doses are due. Pt aware meds were sent to outside pharmacy, hospice bed set up in home. Pt going home w hospice via transport. Daughter at bedside to sign AVS. IV cannulas removed intact, tele d/c'd. Pt on baseline 2L NC

## 2021-12-18 NOTE — Progress Notes (Signed)
Cardiac Surgery Progress Note  Date/Time:  12/18/21 2:48 PM   Patient Name:  Jessica [name] Age: 87 y.o.   Room:  FI264/FI264-01     Operative Profile:   HD#: 6    12/30:  Transfer from [facility] for evaluation of L flank pain in setting of recent TEVAR    08/06/21: TEVAR [MDT Val Cap 42 x 42 x 150 mm; 42 x 42 x 100 mm;  42 x 38 x 150 mm; 38 x 38 x 100  mm endografts].  USG guided access and device closure of the R CFA [> 12 Fr] [40981] with an 18 Fr Manta device.   Drs. Ryan and [place]    TTE 08/14/21  [facility])  LVEF 55-70%.  Moderate AR.  Mild AV sclerosis.  Moderately elevated RVSP .  Moderate pHTN.     Cardiologist: TBD    Synopsis   Indication for Surgery: chronic type 3B aortic dissection    Post-op Course: Patient's left flank pain has been waxing and waning since last August's procedure with recent escalation. Not worsened by eating. Patient reports 60 lb weight loss since the fall with anorexia of unclear etiology.    PMH:  has a past medical history of Atrophic kidney, CAD (coronary artery disease), Dissection of thoracic aorta, Humerus fracture (05/2021), Iron deficiency anemia, and TIA (transient ischemic attack) (07/01/2001).    Daily Summary (see running discharge summary for prior events)  12/30: Admit for L flank pain, eval TEVAR graft  12/30: to IR for thrombin injection to LCFA  1/2: transfuse 1 uprbc for acute on chronic anemia  1/3: hospice consulted, NO CPR  1/4 awaiting hospice to get bed for home      Plan:   Remains in the CVSD due to persistent flank pain and medical mgmt of Type III and IB endoleaks  -  Enlarging excluded aneurysm, type III and a distal type IB endoleak, and LCFA pseudoaneurysm;       non-operable. Palliative and hospice consulted, pt now a DNR and inpatient hospice consult ordered.  - PT/ OT rec SNF, however pt and family prefer home with hospice  - Nutrition consult for severe protein calorie malnutrition    Dispo: d/c to  home with hospice when everything arranged  - awaiting for hospital bed to be delivered               System Assessment of Active Issues       Neurological?:  Pain management: Pain is more controlled today however still does get pain with movement,  receiving scheduled tylenol and lido patch, and PRN oxy    On home dose of allopurinol  Hx of TIA    Cardiovascular:  Type A dissection, s/p open repair (2021)  Chronic Type B dissection, s/p TEVAR (07/2021)  Type III and Ib endoleaks, medical mgmt:   HR and BP control on bisoprolol and digoxin  CAD, on ASA/ statin/ BB    Pulmonary:  Small bilateral pleural effusions and compressive atelectasis.  Will resume diuresis as BP allows      Gastrointestinal:  Anorexia, acute on chronic e/b 60 lb weight loss in last year.  Nutrition consult, Nepro TID ordered     Renal/GU:  IV hydration with LR pre/ post contrasted study  Stage 3b chronic kidney disease with a baseline creatinine of 1.0-1.4: remaining stable  Cr now 1.2     Infectious Disease:  Clinically, no acute issue    Hematological:  Chronic anemia,  on iron supplement chronically   transfused 1 uprbc for acute on chronic anemia present on admission ,most likely 2/2 blood draws    Endocrinological:  Continue daily levothyroxine for hypothyroid;  No hx of DM  HgbA1C:   Lab Results   Component Value Date    HGBA1C 5.0 07/31/2021       Subjective:   Denies pain    Physical Exam:   BP 118/56   Pulse 78   Temp 98.4 F (36.9 C) (Axillary)   Resp 20   Wt 47.4 kg (104 lb 8 oz)   SpO2 96%   BMI 17.94 kg/m     Physical Exam  Vitals reviewed.   Constitutional:       Comments: Cachectic appearing, very pleasant, slightly confused   HENT:      Head: Normocephalic.   Eyes:      Extraocular Movements: Extraocular movements intact.      Pupils: Pupils are equal, round, and reactive to light.   Cardiovascular:      Rate and Rhythm: Normal rate and regular rhythm.      Heart sounds: Normal heart sounds.   Pulmonary:      Effort:  Pulmonary effort is normal.      Comments: Diminished at bases  Abdominal:      General: Abdomen is flat. Bowel sounds are normal.      Palpations: Abdomen is soft.   Musculoskeletal:         General: Normal range of motion.   Skin:     General: Skin is warm and dry.   Neurological:      General: No focal deficit present.      Mental Status: She is alert. Mental status is at baseline.       CBC:  Recent Labs     12/17/21  0454   WBC 11.09*   Hgb 8.2*   Hematocrit 28.1*   Platelets 242     BMP:  Recent Labs     12/17/21  0454   Sodium 142   Potassium 4.5   Chloride 106   CO2 25   BUN 24.0*   Creatinine 1.2*   Glucose 81   Magnesium 2.3   Calcium 9.1             Signed by: Renda RollsJennifer E Basha Krygier, PA-C

## 2021-12-18 NOTE — Progress Notes (Addendum)
Message left for [facility]  liaison Fayrene Fearing [phone] following up on patient's admission for home hospice care    1053 PM: Fayrene Fearing stated the hospital bed has a tentative delivery window from 11-4pm    251PM: per Fayrene Fearing the hospital bed has been delivered, stretcher transport arranged for 430 pm w/ MMT [phone]    Grace Blight MSW  Case Manager  [facility]  [phone]  [email or web address]

## 2021-12-18 NOTE — Discharge Summary (Signed)
CV SURGERY DISCHARGE NOTE    Date Time: 12/18/21 5:04 PM  Patient Name: Jessica [name]  Attending Physician: Halford Decamp, MD    Date of Admission:   12/13/2021    Date of Discharge:   12/18/2021    Reason for Admission:   Flank pain [R10.9]    Discharge Dx:   Principal Problem:    Flank pain  Active Problems:    Pseudoaneurysm     Atrophic kidney       Right    CAD (coronary artery disease)      Dissection of thoracic aorta       Type A dissection    Humerus fracture 05/2021     L Arm in sling s/p fall per dtr    Iron deficiency anemia      TIA (transient ischemic attack) 07/01/2001         Descending Aortic Aneurysm   Chronic Dissection of Descending Aorta  CAD  Dementia  Hx of Repair of Type A Aortic Dissection '21  HTN  Chronic anemia         Consultations:   Hospice Dr. Namon Cirri    Procedures performed:       [name] Course:     Jessica [name] is a 87 y.o. female with PMHx of HTN, CKD, CAD, and TIA who presents from ER after transfer from [facility] where she presented with L flank pain. Patient is s/p TEVAR on 08/06/21 with Drs. Sullivan Lone, and [place]. She has a surgical history of open type A [place] . Per patient and niece who is bedside, patient's left flank pain has been waxing and waning since last August's procedure with recent escalation. Not worsened by eating. Patient reports 60 lb weight loss since the fall with anorexia of unclear etiology. No f/c. Known atopic R kidney.     Patient has not had imaging follow up since procedure in August. CTA post hydration concerning for Type III and Type IB endoleak with aneurysm sac growth. Also noted to have L CFA pseudoaneurysm.     Patient received imaging which revealed Type III and IB endoleaks  -  Enlarging excluded aneurysm, type III and a distal type IB endoleak, and LCFA pseudoaneurysm;       non-operable.     She remained in the [facility] due to persistent flank pain and medical mgmt    Palliative and hospice  consulted, pt now a DNR.   Patient and her daughters requested that she go home with hospice.  She remained hemodynamically stable.  Her pain was managed with tylenol and ultram,  Hospice to manage as an outpatient.   All equipment was delivered to home and patient scheduled for d/c home today.      PMH:  has a past medical history of Atrophic kidney, CAD (coronary artery disease), Dissection of thoracic aorta, Humerus fracture (05/2021), Iron deficiency anemia, and TIA (transient ischemic attack) (07/01/2001).     Daily Summary (see running discharge summary for prior events)  12/30: Admit for L flank pain, eval TEVAR graft  12/30: to IR for thrombin injection to LCFA  1/2: transfuse 1 uprbc for acute on chronic anemia  1/3: hospice consulted, NO CPR  1/4 awaiting hospice to get bed for home             Discharge Medications:     Current Discharge Medication List        START taking these medications  Details   oxyCODONE (ROXICODONE) 5 MG immediate release tablet Take 1 tablet (5 mg) by mouth every 4 (four) hours as needed for Pain  Qty: 20 tablet, Refills: 0           CONTINUE these medications which have CHANGED    Details   acetaminophen (TYLENOL) 325 MG tablet Take 2 tablets (650 mg) by mouth every 4 (four) hours as needed for Pain May substitute for 1 bottle OTC if not covered by insurance  Qty: 100 tablet, Refills: 0           CONTINUE these medications which have NOT CHANGED    Details   albuterol sulfate HFA (PROVENTIL) 108 (90 Base) MCG/ACT inhaler Inhale 2 puffs into the lungs every 4 (four) hours as needed for Wheezing      allopurinol (ZYLOPRIM) 300 MG tablet Take 300 mg by mouth daily      ascorbic acid (VITAMIN C) 100 MG tablet Take 100 mg by mouth daily      aspirin EC 81 MG EC tablet Take 1 tablet (81 mg total) by mouth daily      atorvastatin (LIPITOR) 40 MG tablet Take 40 mg by mouth daily      bisoprolol (ZEBETA) 5 MG tablet Take 2.5 mg by mouth daily      cyproheptadine (PERIACTIN) 4 MG tablet  Take 4 mg by mouth 3 (three) times daily as needed      dronabinol (MARINOL) 2.5 MG capsule Take 1 capsule by mouth 2 (two) times daily      famotidine (PEPCID) 40 MG tablet Take 40 mg by mouth daily      ferrous sulfate 325 (65 FE) MG tablet Take 325 mg by mouth every morning with breakfast      Latanoprost 0.005 % Emulsion Apply to eye      levothyroxine (SYNTHROID) 25 MCG tablet Take 25 mcg by mouth      polyethylene glycol (MIRALAX) 17 g packet Take 17 g by mouth daily as needed (constipation)      senna-docusate (PERICOLACE) 8.6-50 MG per tablet Take 1 tablet by mouth nightly as needed for Constipation      vitamin D (CHOLECALCIFEROL) 25 MCG (1000 UT) tablet Take 1,000 Units by mouth daily           STOP taking these medications       digoxin (LANOXIN) 0.25 MG tablet        furosemide (LASIX) 40 MG tablet                Discharge Instructions:   Blood pressure management SBP <120  Pain management           Renda RollsJennifer E Roche, PA, PA-C  Cardiovascular Surgery  [name]

## 2021-12-18 NOTE — Plan of Care (Signed)
Problem: Safety  Goal: Patient will be free from injury during hospitalization  Outcome: Adequate for Discharge  Goal: Patient will be free from infection during hospitalization  Outcome: Adequate for Discharge     Problem: Pain  Goal: Pain at adequate level as identified by patient  Outcome: Adequate for Discharge     Problem: Discharge Barriers  Goal: Patient will be discharged home or other facility with appropriate resources  Outcome: Adequate for Discharge

## 2021-12-18 NOTE — Discharge Instr - AVS First Page (Addendum)
Reason for your Hospital Admission:  Flank pain      Instructions for after your discharge:  Keep blood pressure 100-120  Call hospice for pain management needs

## 2021-12-19 LAB — ANTIBODY DIFF. XM MD REVIEW CPT 86077 - REFLEX

## 2022-01-31 ENCOUNTER — Telehealth: Payer: Self-pay | Admitting: Pharmacist

## 2022-01-31 NOTE — Progress Notes (Signed)
Chronic Care Management Pharmacy Assistant   Name: Danielle Atkins  MRN: [id number] DOB: 1935-04-16  Reason for Encounter: General Assessment   Conditions to be addressed/monitored: HTN and DMII  Recent office visits:  07/20/20 Danielle Atkins, Danielle Halsted, MD - Patient presented for type 2 diabetes with neuropathy and other concerns.  Recent consult visits:  None   Hospital visits:  Medication Reconciliation was completed by comparing discharge summary, patients EMR and Pharmacy list, and upon discussion with patient.  Patient presented to [facility] on 06/08/21 due to fall and fracture. Discharge date was 06/08/21  New?Medications Started at [facility] Discharge:?? -started Lidocaine patch Robaxin 750 mg Roxicodone 5 mg   Medication Changes at Hospital Discharge: -Changed  None  Medications Discontinued at Hospital Discharge: -Stopped  none  Medications that remain the same after Hospital Discharge:??  -All other medications will remain the same.    Medications: Outpatient Encounter Medications as of 01/31/2022  Medication Sig   acetaminophen (TYLENOL) 325 MG tablet Take 2 tablets (650 mg total) by mouth every 6 (six) hours as needed for mild pain (or Fever >/= 101).   albuterol (PROAIR HFA) 108 (90 Base) MCG/ACT inhaler Inhale 2 puffs into the lungs every 6 (six) hours as needed for wheezing.   allopurinol (ZYLOPRIM) 300 MG tablet Take 1 tablet (300 mg total) by mouth daily.   Ascorbic Acid (VITAMIN C) 100 MG tablet Take 10 tablets (1,000 mg total) by mouth daily. Takes C Complex   ascorbic acid (VITAMIN C) 500 MG tablet Take 1,000 mg by mouth in the morning.   aspirin EC 325 MG EC tablet Take 1 tablet (325 mg total) by mouth daily.   bisoprolol (ZEBETA) 5 MG tablet Take 1 tablet (5 mg total) by mouth daily.   Cholecalciferol (VITAMIN D3) 125 MCG (5000 UT) TABS Take 5,000 Units by mouth daily.   cholecalciferol (VITAMIN D3) 25 MCG (1000 UNIT)  tablet Take 5 tablets (5,000 Units total) by mouth daily.   digoxin (LANOXIN) 0.125 MG tablet TAKE 1/2 TABLET (0.0625MG  TOTAL) BY MOUTH DAILY   famotidine (PEPCID) 40 MG tablet Take 1 tablet (40 mg total) by mouth daily.   feeding supplement, GLUCERNA SHAKE, (GLUCERNA SHAKE) LIQD Take 237 mLs by mouth 2 (two) times daily between meals.   ferrous sulfate 325 (65 FE) MG tablet Take 1 tablet (325 mg total) by mouth 2 (two) times daily with a meal.   latanoprost (XALATAN) 0.005 % ophthalmic solution Place 1 drop into both eyes at bedtime.    levothyroxine (SYNTHROID) 25 MCG tablet Take 1 tablet (25 mcg total) by mouth in the morning.   metFORMIN (GLUCOPHAGE) 500 MG tablet Take 1 tablet (500 mg total) by mouth daily with breakfast.   potassium chloride SA (KLOR-CON) 20 MEQ tablet Take 2 tablets (40 mEq total) by mouth daily.   simvastatin (ZOCOR) 40 MG tablet Take 1 tablet (40 mg total) by mouth daily. (Patient taking differently: Take 40 mg by mouth at bedtime. )   No facility-administered encounter medications on file as of 01/31/2022.  Unable to reach pt or dgt   Care Gaps: Zoster Vaccine - Overdue Urine micro - Overdue TDAP - Overdue Foot Exam - Overdue COVID Booster - Overdue HGB A1C - Overdue Eye Exam - Overdue Flu Vaccine - Overdue BP- 110/75 (06/08/21) AWV- MSG sent to [name] CMA to schedule.  Lab Results  Component Value Date   HGBA1C 5.8 (H) 09/14/2020    Star Rating Drugs:  Metformin 500 mg - Last filled 03/02/21 90 DS at [facility] Verified as accurate Simvastatin 40 mg - Last filled 09/12/20 90 DS at [facility] as accurate   [place] Pharmacist Assistant [phone]

## 2022-08-19 IMAGING — US US RENAL
1 series · 14 of 25 positions shown · non-contrast
Comparison: 10/19/2020 abdominal sonogram.

CLINICAL DATA: Inpatient.  Acute kidney injury.

EXAM:
RENAL / URINARY TRACT ULTRASOUND COMPLETE

[Series 1: us renal · 14 of 27 slices shown]
[im 1/27]
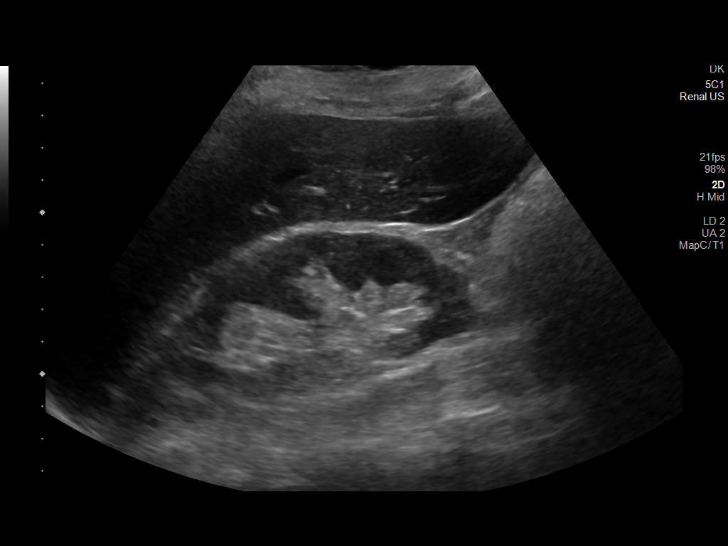
[im 3/27]
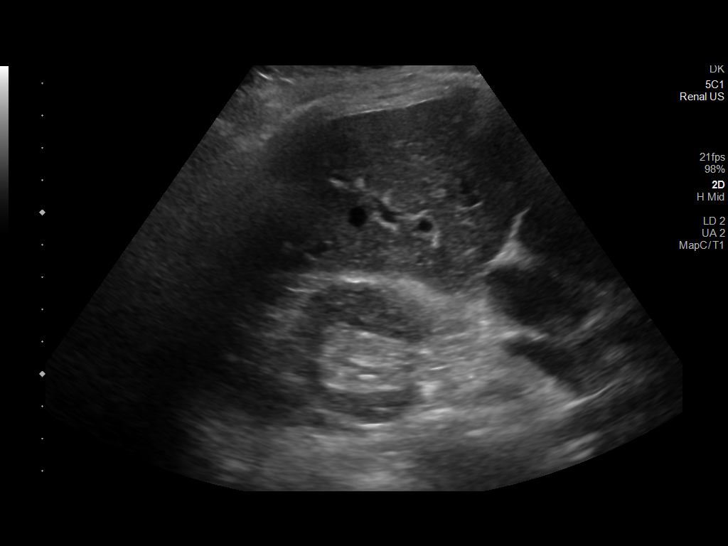
[im 5/27]
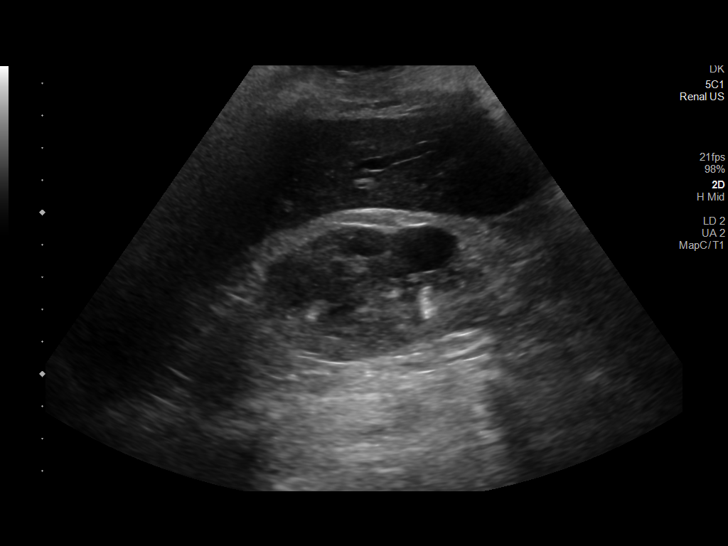
[im 7/27]
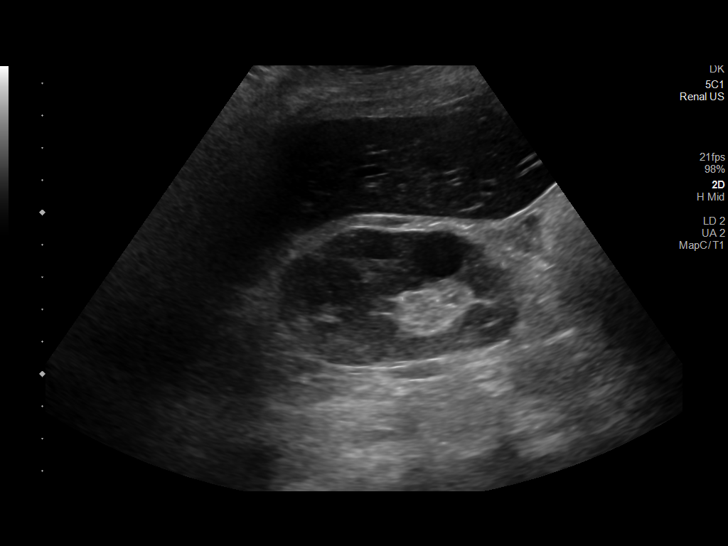
[im 9/27]
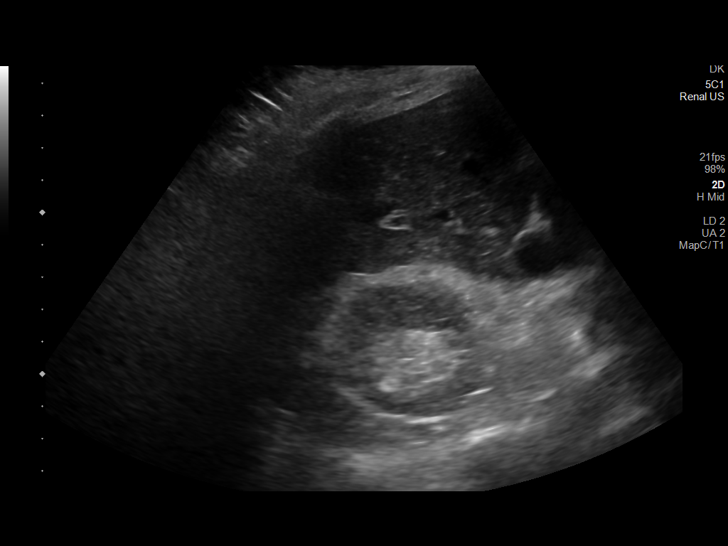
[im 10/27]
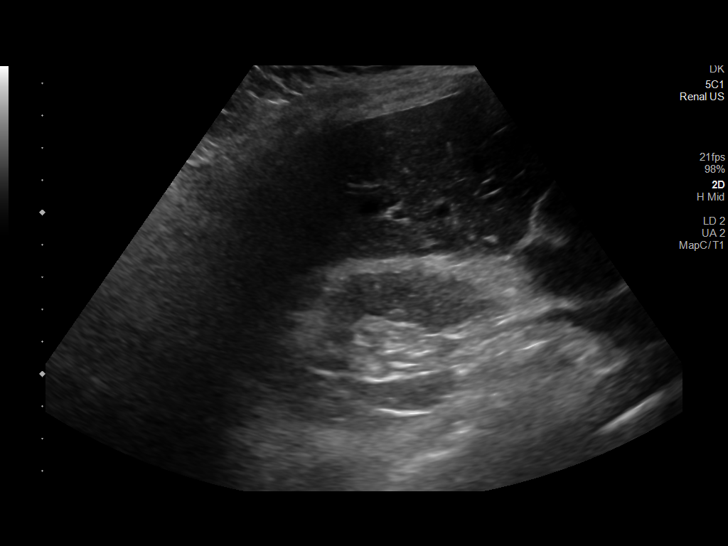
[im 12/27]
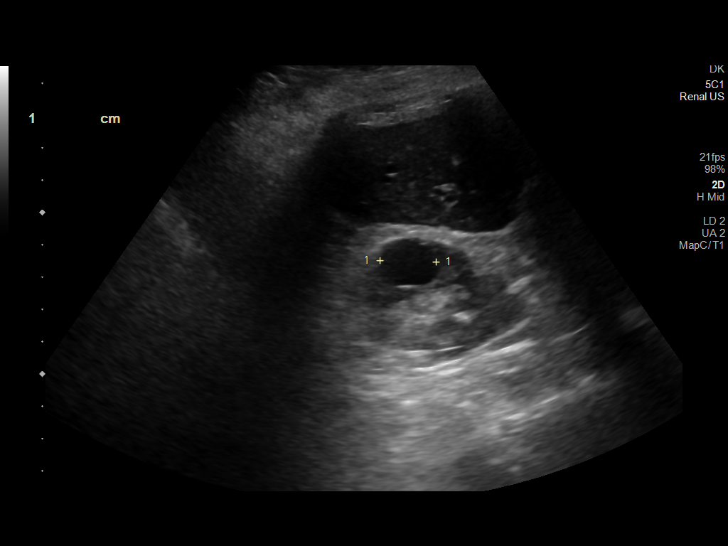
[im 15/27]
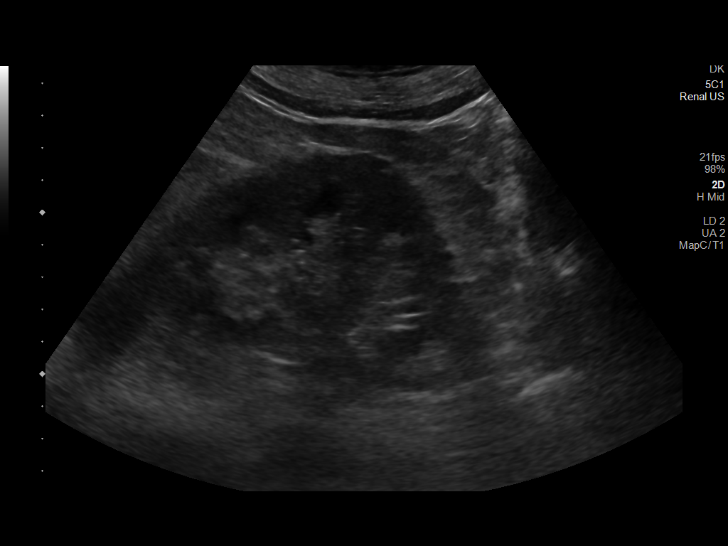
[im 17/27]
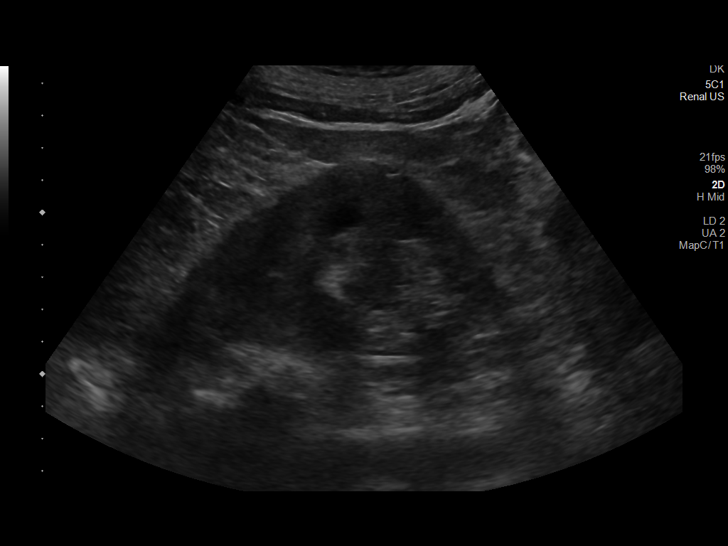
[im 18/27]
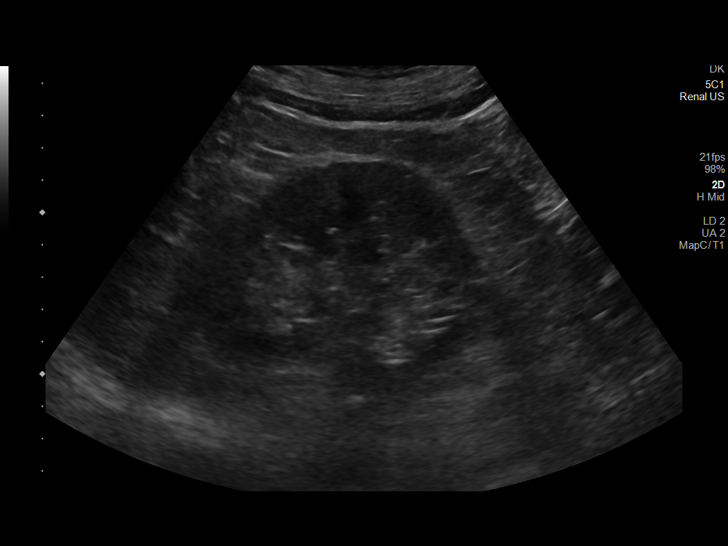
[im 20/27]
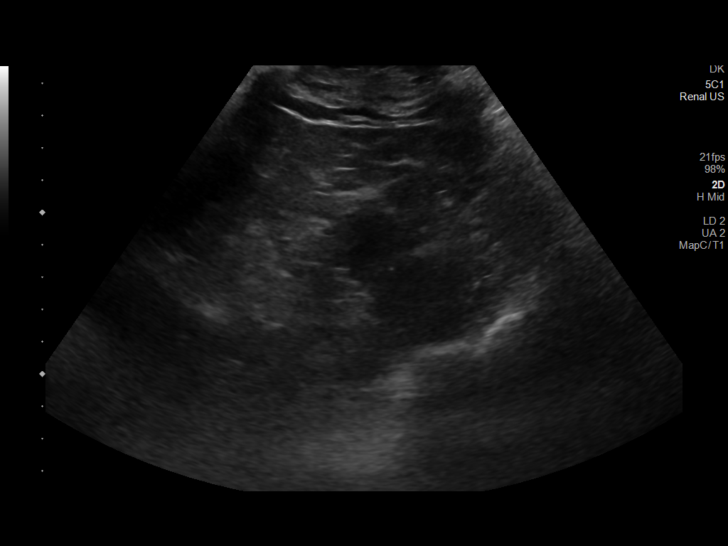
[im 22/27]
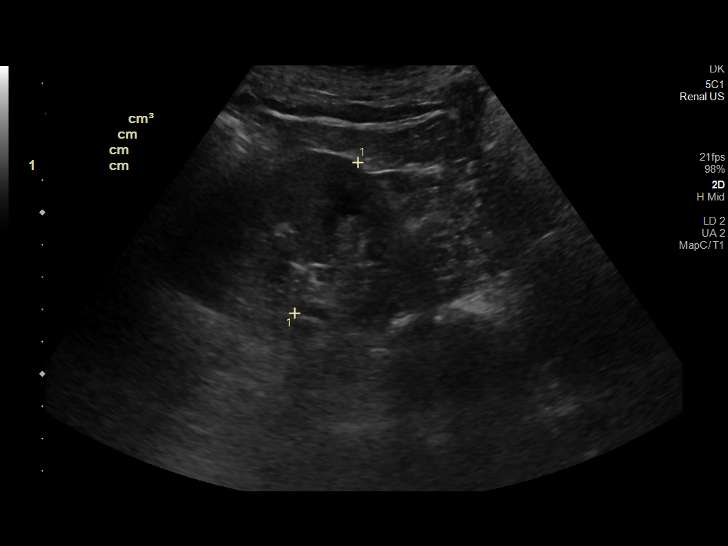
[im 24/27]
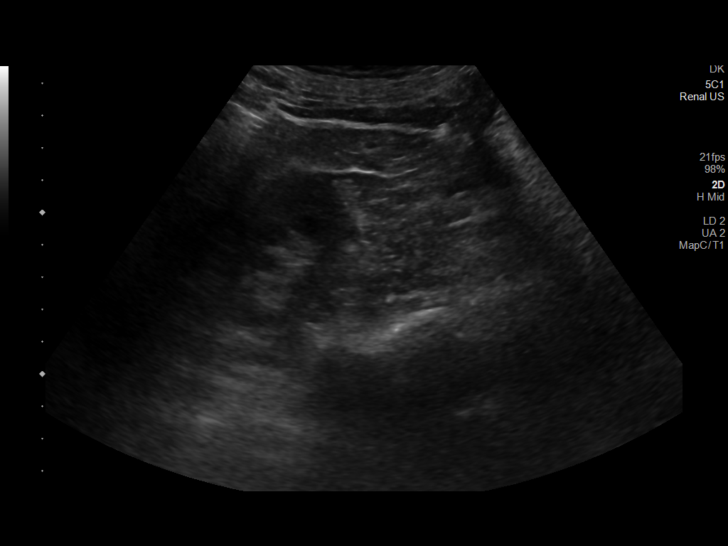
[im 27/27]
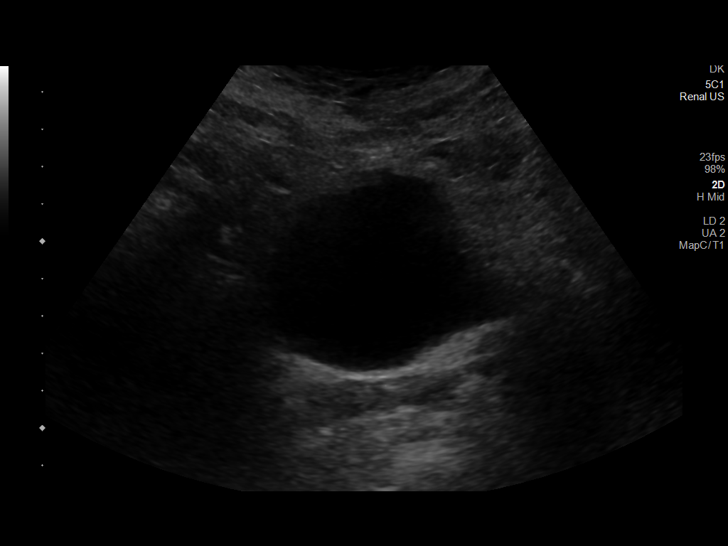

[14 of 25 positions shown; findings below may reference images not displayed]

FINDINGS: Right Kidney:

Renal measurements: 10.0 x 4.3 x 4.2 cm = volume: 96 mL. Echogenic
right renal parenchyma, normal thickness. No hydronephrosis. Simple
1.5 x 1.5 x 1.7 cm lower right renal cyst.

Left Kidney:

Renal measurements: 10.2 x 6.1 x 5.1 cm = volume: 166 mL. Echogenic
left renal parenchyma, normal thickness. No hydronephrosis. No renal
mass.

Bladder:

Appears normal for degree of bladder distention.

Other:

None.
IMPRESSION: 1. No hydronephrosis.
2. Echogenic kidneys, compatible with the reported history of
nonspecific acute renal parenchymal disease.
3. Small simple right renal cyst.
4. Normal bladder.

## 2023-01-06 NOTE — Progress Notes (Signed)
This encounter was created in error - please disregard.
